# Patient Record
Sex: Male | Born: 1960 | Race: Black or African American | Hispanic: No | Marital: Single | Smoking: Current every day smoker
Health system: Southern US, Community
[De-identification: ages and names within clinical notes are randomized; demographics above are authoritative.]

## PROBLEM LIST (undated history)

## (undated) DIAGNOSIS — D539 Nutritional anemia, unspecified: Secondary | ICD-10-CM

## (undated) DIAGNOSIS — C801 Malignant (primary) neoplasm, unspecified: Secondary | ICD-10-CM

## (undated) DIAGNOSIS — N209 Urinary calculus, unspecified: Secondary | ICD-10-CM

## (undated) DIAGNOSIS — Z5189 Encounter for other specified aftercare: Secondary | ICD-10-CM

## (undated) DIAGNOSIS — J431 Panlobular emphysema: Secondary | ICD-10-CM

## (undated) DIAGNOSIS — M1990 Unspecified osteoarthritis, unspecified site: Secondary | ICD-10-CM

## (undated) DIAGNOSIS — M5416 Radiculopathy, lumbar region: Secondary | ICD-10-CM

## (undated) DIAGNOSIS — N189 Chronic kidney disease, unspecified: Secondary | ICD-10-CM

## (undated) DIAGNOSIS — N400 Enlarged prostate without lower urinary tract symptoms: Secondary | ICD-10-CM

## (undated) DIAGNOSIS — T7840XA Allergy, unspecified, initial encounter: Secondary | ICD-10-CM

## (undated) DIAGNOSIS — F1910 Other psychoactive substance abuse, uncomplicated: Secondary | ICD-10-CM

## (undated) DIAGNOSIS — Z789 Other specified health status: Secondary | ICD-10-CM

## (undated) HISTORY — DX: Chronic kidney disease, unspecified: N18.9

## (undated) HISTORY — DX: Other psychoactive substance abuse, uncomplicated: F19.10

## (undated) HISTORY — DX: Radiculopathy, lumbar region: M54.16

## (undated) HISTORY — DX: Allergy, unspecified, initial encounter: T78.40XA

## (undated) HISTORY — DX: Benign prostatic hyperplasia without lower urinary tract symptoms: N40.0

## (undated) HISTORY — DX: Panlobular emphysema: J43.1

## (undated) HISTORY — DX: Encounter for other specified aftercare: Z51.89

## (undated) HISTORY — PX: TONSILLECTOMY: SUR1361

## (undated) HISTORY — PX: CERVICAL DISC SURGERY: SHX588

## (undated) HISTORY — PX: BACK SURGERY: SHX140

## (undated) NOTE — *Deleted (*Deleted)
Transition of Care [facility]) - Progression Note    Patient Details  Name: [name] MRN: [id number] Date of Birth: [birth year]  Transition of Care [facility]) CM/SW Contact  Natasha Bence, LCSW Phone Number: 02/05/2020, 11:52 AM  Clinical Narrative:    CSW contacted [place] to confirm their ability to take patient. Robin with [place] reported that they are unable to take patient due to patient's pain med needs and level of care required. [facility] [place] reported that they did not have bed availability for his level of care at the moment and that patient would need to be accessed on Monday for GIP before receiving patient. Patient is already GIP per MD. CSW notified Shirlean Mylar of the need form hospice to discuss delay in discharge to [place] to patient's daughter for         Expected Discharge Plan and Services           Expected Discharge Date: 02/04/20                                     Social Determinants of Health (SDOH) Interventions    Readmission Risk Interventions Readmission Risk Prevention Plan 07/16/2018 07/16/2018 06/14/2018  Transportation Screening Complete Complete Complete  Social Work Consult for [place] Planning/Counseling - - Complete  PCP or Specialist appointment within 3-5 days of discharge Complete - -  [place] or Home Care Consult Complete - -  Palliative Care Screening Not Applicable - -  [place] Not Applicable - -  Some recent data might be hidden

## (undated) NOTE — *Deleted (*Deleted)
Physician Discharge Summary  Artemas Lettman [id number] DOB: March 01, 1961 DOA: 02/04/2020  PCP: Rosita Fire, MD  Admit date: 02/16/2020 Discharge date: 02-12-2020  Admitted From: Home Disposition:  Residential Hospice  Recommendations for Outpatient Follow-up:  1. Follow up with PCP in 1-2 weeks 2. Please obtain BMP/CBC in one week 3. Please follow up on the following pending results:  Home Health:*** (YES/NO) Equipment/Devices:*** (oxygen ?L, foley, PICC line--date placed, wound care, Pleurx)  Discharge Condition: Stable CODE STATUS:*** (FULL, DNR, Comfort Care) Diet recommendation: Heart Healthy / Carb Modified / Dysphagia / Regular   Brief/Interim Summary: 35 y.o.malewith medical history significant ofend-stage myeloma, associated pancytopenia, generalized failure to thrive, chronic pain from underlying malignancy, admitted to the hospital with severe cytopenias including thrombocytopenia and anemia requiring transfusion of PRBC and platelets. He was continued on pain management. He was recently seen at [facility] by oncology and was not felt to be a candidate for any further treatments. After seeing palliative care, family elected to pursue comfort measures and has been transition to comfort care. He is currently on fentanyl infusion for pain management and respiratory distress. He is waiting a bed at residential hospice. With the assistance of hospice of [place], they requested that the patient be transitioned to TD and/or po comfort meds in preparation to transition to residential hospice.  TD fentanyl was started and  fentanyl infusion was subsequently wanted off.  The patient continued to receive po and IV opioids for breakthrough pain and dyspnea.  His family was educated on anticipatory care going forward with focus soley on the patient's comfort.  They agree with the overall plan, but did make multiple requests for  adjustments of the patient's medications to optimize the patient's comfort.  The patient remained hemodynamically stable for transfer to residential hospice.  Discharge Diagnoses:   Severe Pancytopenia/Myeloma -presented with WBC 1.9, Hgb 6.1 and platelets 12K -01/10/20 bone marrow--97% plasma cellswith unfavorable cytogenetics -Evaluated on 01/26/20 bymyeloma specialist Dr.-Jonathan Percell Miller Lambird--recommended transition to palliative care and hospice patient does not have any good therapeutic options -has been transfused with PRBC and platelets -no further blood draws as focus of care is comfort directed -pt has had some bleeding gums due to pancytopenia  CKD 5/hyperkalemia -baseline creatinine 5.6-5.8 -essentially anuric at this point -no further blood draws as focus of care is comfort directed  LumbarRadiculopathy---- -Spinal Stenosis/lumbar radiculopathy status post fusion(initially in 2012 and then again in4/25/2017)---- MRI L-spine--diffusely abnormal bone marrow signal concerning for myeloproliferative disorder versus infiltrative metastatic disease. Abnormal prevertebraltissue as discussed above at L2-3 -3/16 MR T-spine--no acute findings,neurosurgery, Dr. Melynda Keller nonoperative managementfor radiculopathy -MRI of the thoracic and lumbar spine on 10/06/2019 shows T7, T10, T11 and T12 lesions are slightly better seen on the study as the prior study was degraded by motion artifact. No new findings. Overall grossly stable appearing myelomatous changes at T12, L2 and L3 with no progressive findings. Right seventh and ninth posterior rib lesions are probably stable. Right seventh posterior rib lesion may be larger or just better seen. There does appear to be a new adjacent soft tissue component.  COPD -stable on RA -BDs as needed  Chronic pain/neoplastic related pain -transition from IV fentanyl to TD with prn breakthrough meds -increase fentanyl TD to 150  mcg -added robaxin  Goals of Care/Social -see by palliative medicine -after goals of care discussion, patient has been transitioned to full comfort care -now GIP whil waiting for residential hospice bed -add ativan prn agitation although  family preferred haldol -continue fentanyl TD and IV for breakthrough pain -dilaudid po prn breakthrough pain -robinul for secretions   Discharge Instructions   Allergies as of 02/05/2020      Reactions   Oxycodone-acetaminophen    Acetaminophen Nausea Only, Other (See Comments)   Elevated liver enzymes   Cymbalta [duloxetine Hcl] Swelling   Facial swelling   Diclofenac Sodium Swelling      Diclofenac Sodium Swelling   Imodium [loperamide] Swelling   facial   Lyrica [pregabalin] Swelling   Morphine Other (See Comments)   Bradycardia   Vioxx [rofecoxib] Swelling   Aleve [naproxen] Swelling   eye      Medication List    TAKE these medications   fentaNYL 75 MCG/HR Commonly known as: DURAGESIC Place 2 patches onto the skin every 3 (three) days. Start taking on: 05-Mar-2020            Durable Medical Equipment  (From admission, onward)         Start     Ordered   01/31/20 1054  For home use only DME Air overlay mattress  Once        01/31/20 1053          Allergies  Allergen Reactions  . Oxycodone-Acetaminophen   . Acetaminophen Nausea Only and Other (See Comments)    Elevated liver enzymes  . Cymbalta [Duloxetine Hcl] Swelling    Facial swelling  . Diclofenac Sodium Swelling       . Diclofenac Sodium Swelling  . Imodium [Loperamide] Swelling    facial  . Lyrica [Pregabalin] Swelling  . Morphine Other (See Comments)    Bradycardia   . Vioxx [Rofecoxib] Swelling  . Aleve [Naproxen] Swelling    eye    Consultations:  Palliative medicine   Procedures/Studies:  No results found.      Discharge Exam: Vitals:   02/02/20 0828 02/04/20 2122  BP: 106/62 139/63  Pulse: 96 (!) 115  Resp: 19  20  Temp: 98.4 F (36.9 C) 98.4 F (36.9 C)  SpO2:  100%   Vitals:   02/01/20 1507 02/01/20 1742 02/02/20 0828 02/04/20 2122  BP: (!) 113/59  106/62 139/63  Pulse: (!) 105  96 (!) 115  Resp: 15  19 20   Temp: 99.3 F (37.4 C)  98.4 F (36.9 C) 98.4 F (36.9 C)  TempSrc: Oral  Oral   SpO2: 99% 99% 100% 100%    General: Pt is alert, awake, not in acute distress Cardiovascular: RRR, S1/S2 +, no rubs, no gallops Respiratory: bibasilar rales. No wheeze Abdominal: Soft, NT, ND, bowel sounds + Extremities: no edema, no cyanosis   The results of significant diagnostics from this hospitalization (including imaging, microbiology, ancillary and laboratory) are listed below for reference.    Significant Diagnostic Studies: No results found.   Microbiology: Recent Results (from the past 240 hour(s))  Respiratory Panel by RT PCR (Flu A&B, Covid) - Nasopharyngeal Swab     Status: None   Collection Time: 01/27/20  1:41 PM   Specimen: Nasopharyngeal Swab  Result Value Ref Range Status   SARS Coronavirus 2 by RT PCR NEGATIVE NEGATIVE Final    Comment: (NOTE) SARS-CoV-2 target nucleic acids are NOT DETECTED.  The SARS-CoV-2 RNA is generally detectable in upper respiratoy specimens during the acute phase of infection. The lowest concentration of SARS-CoV-2 viral copies this assay can detect is 131 copies/mL. A negative result does not preclude SARS-Cov-2 infection and should not be used as  the sole basis for treatment or other patient management decisions. A negative result may occur with  improper specimen collection/handling, submission of specimen other than nasopharyngeal swab, presence of viral mutation(s) within the areas targeted by this assay, and inadequate number of viral copies (<131 copies/mL). A negative result must be combined with clinical observations, patient history, and epidemiological information. The expected result is Negative.  Fact Sheet for Patients:   PinkCheek.be  Fact Sheet for Healthcare Providers:  GravelBags.it  This test is no t yet approved or cleared by the [place] FDA and  has been authorized for detection and/or diagnosis of SARS-CoV-2 by FDA under an Emergency Use Authorization (EUA). This EUA will remain  in effect (meaning this test can be used) for the duration of the COVID-19 declaration under Section 564(b)(1) of the Act, 21 U.S.C. section 360bbb-3(b)(1), unless the authorization is terminated or revoked sooner.     Influenza A by PCR NEGATIVE NEGATIVE Final   Influenza B by PCR NEGATIVE NEGATIVE Final    Comment: (NOTE) The Xpert Xpress SARS-CoV-2/FLU/RSV assay is intended as an aid in  the diagnosis of influenza from Nasopharyngeal swab specimens and  should not be used as a sole basis for treatment. Nasal washings and  aspirates are unacceptable for Xpert Xpress SARS-CoV-2/FLU/RSV  testing.  Fact Sheet for Patients: PinkCheek.be  Fact Sheet for Healthcare Providers: GravelBags.it  This test is not yet approved or cleared by the [place] FDA and  has been authorized for detection and/or diagnosis of SARS-CoV-2 by  FDA under an Emergency Use Authorization (EUA). This EUA will remain  in effect (meaning this test can be used) for the duration of the  Covid-19 declaration under Section 564(b)(1) of the Act, 21  U.S.C. section 360bbb-3(b)(1), unless the authorization is  terminated or revoked. Performed at [facility], [address] [place], [place] [address]      Labs: Basic Metabolic Panel: No results for input(s): NA, K, CL, CO2, GLUCOSE, BUN, CREATININE, CALCIUM, MG, PHOS in the last 168 hours. Liver Function Tests: No results for input(s): AST, ALT, ALKPHOS, BILITOT, PROT, ALBUMIN in the last 168 hours. No results for input(s): LIPASE, AMYLASE in the last 168 hours. No  results for input(s): AMMONIA in the last 168 hours. CBC: No results for input(s): WBC, NEUTROABS, HGB, HCT, MCV, PLT in the last 168 hours. Cardiac Enzymes: No results for input(s): CKTOTAL, CKMB, CKMBINDEX, TROPONINI in the last 168 hours. BNP: Invalid input(s): POCBNP CBG: No results for input(s): GLUCAP in the last 168 hours.  Time coordinating discharge:  36 minutes  Signed:  Orson Eva, DO [facility] Pager: [phone] 02/05/2020, 5:38 PM

---

## 2000-03-26 ENCOUNTER — Ambulatory Visit (HOSPITAL_COMMUNITY): Admission: RE | Admit: 2000-03-26 | Discharge: 2000-03-26 | Payer: Self-pay | Admitting: Family Medicine

## 2002-03-31 DIAGNOSIS — C801 Malignant (primary) neoplasm, unspecified: Secondary | ICD-10-CM

## 2002-03-31 HISTORY — DX: Malignant (primary) neoplasm, unspecified: C80.1

## 2002-03-31 HISTORY — PX: OTHER SURGICAL HISTORY: SHX169

## 2004-02-17 ENCOUNTER — Emergency Department (HOSPITAL_COMMUNITY): Admission: EM | Admit: 2004-02-17 | Discharge: 2004-02-17 | Payer: Self-pay | Admitting: Emergency Medicine

## 2007-03-15 ENCOUNTER — Emergency Department (HOSPITAL_COMMUNITY): Admission: EM | Admit: 2007-03-15 | Discharge: 2007-03-15 | Payer: Self-pay | Admitting: Emergency Medicine

## 2007-07-02 ENCOUNTER — Ambulatory Visit (HOSPITAL_COMMUNITY): Admission: RE | Admit: 2007-07-02 | Discharge: 2007-07-02 | Payer: Self-pay | Admitting: Family Medicine

## 2007-07-09 ENCOUNTER — Ambulatory Visit (HOSPITAL_COMMUNITY): Admission: RE | Admit: 2007-07-09 | Discharge: 2007-07-09 | Payer: Self-pay | Admitting: Family Medicine

## 2007-10-26 ENCOUNTER — Emergency Department (HOSPITAL_COMMUNITY): Admission: EM | Admit: 2007-10-26 | Discharge: 2007-10-26 | Payer: Self-pay | Admitting: Emergency Medicine

## 2007-11-03 ENCOUNTER — Inpatient Hospital Stay (HOSPITAL_COMMUNITY): Admission: RE | Admit: 2007-11-03 | Discharge: 2007-11-05 | Payer: Self-pay | Admitting: Neurosurgery

## 2007-11-17 ENCOUNTER — Emergency Department (HOSPITAL_COMMUNITY): Admission: EM | Admit: 2007-11-17 | Discharge: 2007-11-17 | Payer: Self-pay | Admitting: Emergency Medicine

## 2008-02-01 ENCOUNTER — Encounter (HOSPITAL_COMMUNITY): Admission: RE | Admit: 2008-02-01 | Discharge: 2008-03-02 | Payer: Self-pay | Admitting: Neurosurgery

## 2008-02-09 ENCOUNTER — Emergency Department (HOSPITAL_COMMUNITY): Admission: EM | Admit: 2008-02-09 | Discharge: 2008-02-09 | Payer: Self-pay | Admitting: Emergency Medicine

## 2008-03-16 ENCOUNTER — Ambulatory Visit: Admission: RE | Admit: 2008-03-16 | Discharge: 2008-03-16 | Payer: Self-pay | Admitting: Neurosurgery

## 2008-03-23 ENCOUNTER — Emergency Department (HOSPITAL_COMMUNITY): Admission: EM | Admit: 2008-03-23 | Discharge: 2008-03-23 | Payer: Self-pay | Admitting: Emergency Medicine

## 2008-03-29 ENCOUNTER — Ambulatory Visit (HOSPITAL_COMMUNITY): Admission: RE | Admit: 2008-03-29 | Discharge: 2008-03-29 | Payer: Self-pay | Admitting: Family Medicine

## 2008-04-11 ENCOUNTER — Ambulatory Visit (HOSPITAL_COMMUNITY): Admission: RE | Admit: 2008-04-11 | Discharge: 2008-04-11 | Payer: Self-pay | Admitting: Urology

## 2008-04-25 ENCOUNTER — Encounter: Admission: RE | Admit: 2008-04-25 | Discharge: 2008-04-25 | Payer: Self-pay | Admitting: Neurosurgery

## 2008-06-02 ENCOUNTER — Inpatient Hospital Stay (HOSPITAL_COMMUNITY): Admission: RE | Admit: 2008-06-02 | Discharge: 2008-06-04 | Payer: Self-pay | Admitting: Neurosurgery

## 2008-07-05 ENCOUNTER — Ambulatory Visit (HOSPITAL_COMMUNITY): Admission: RE | Admit: 2008-07-05 | Discharge: 2008-07-05 | Payer: Self-pay | Admitting: Family Medicine

## 2008-07-28 ENCOUNTER — Inpatient Hospital Stay (HOSPITAL_COMMUNITY): Admission: RE | Admit: 2008-07-28 | Discharge: 2008-07-30 | Payer: Self-pay | Admitting: Neurosurgery

## 2008-11-22 ENCOUNTER — Encounter: Admission: RE | Admit: 2008-11-22 | Discharge: 2008-11-22 | Payer: Self-pay | Admitting: Neurosurgery

## 2008-12-27 ENCOUNTER — Ambulatory Visit (HOSPITAL_COMMUNITY): Admission: RE | Admit: 2008-12-27 | Discharge: 2008-12-28 | Payer: Self-pay | Admitting: Neurosurgery

## 2009-02-13 ENCOUNTER — Encounter (HOSPITAL_COMMUNITY): Admission: RE | Admit: 2009-02-13 | Discharge: 2009-03-15 | Payer: Self-pay | Admitting: Neurosurgery

## 2009-07-27 ENCOUNTER — Emergency Department (HOSPITAL_COMMUNITY): Admission: EM | Admit: 2009-07-27 | Discharge: 2009-07-27 | Payer: Self-pay | Admitting: Emergency Medicine

## 2009-08-21 ENCOUNTER — Encounter: Admission: RE | Admit: 2009-08-21 | Discharge: 2009-08-21 | Payer: Self-pay | Admitting: Neurosurgery

## 2010-01-16 ENCOUNTER — Inpatient Hospital Stay (HOSPITAL_COMMUNITY): Admission: RE | Admit: 2010-01-16 | Discharge: 2010-01-23 | Payer: Self-pay | Admitting: Neurosurgery

## 2010-01-16 ENCOUNTER — Ambulatory Visit: Payer: Self-pay | Admitting: Cardiovascular Disease

## 2010-01-18 ENCOUNTER — Encounter (INDEPENDENT_AMBULATORY_CARE_PROVIDER_SITE_OTHER): Payer: Self-pay | Admitting: Neurosurgery

## 2010-02-25 ENCOUNTER — Encounter: Payer: Self-pay | Admitting: Cardiovascular Disease

## 2010-02-25 ENCOUNTER — Ambulatory Visit: Payer: Self-pay | Admitting: Cardiovascular Disease

## 2010-02-25 DIAGNOSIS — I493 Ventricular premature depolarization: Secondary | ICD-10-CM | POA: Insufficient documentation

## 2010-03-07 ENCOUNTER — Telehealth (INDEPENDENT_AMBULATORY_CARE_PROVIDER_SITE_OTHER): Payer: Self-pay | Admitting: *Deleted

## 2010-04-08 ENCOUNTER — Telehealth (INDEPENDENT_AMBULATORY_CARE_PROVIDER_SITE_OTHER): Payer: Self-pay | Admitting: *Deleted

## 2010-04-24 ENCOUNTER — Encounter: Payer: Self-pay | Admitting: Cardiovascular Disease

## 2010-04-24 ENCOUNTER — Ambulatory Visit
Admission: RE | Admit: 2010-04-24 | Discharge: 2010-04-24 | Payer: Self-pay | Source: Home / Self Care | Attending: Cardiovascular Disease | Admitting: Cardiovascular Disease

## 2010-04-24 DIAGNOSIS — M79609 Pain in unspecified limb: Secondary | ICD-10-CM | POA: Insufficient documentation

## 2010-04-24 DIAGNOSIS — I6529 Occlusion and stenosis of unspecified carotid artery: Secondary | ICD-10-CM | POA: Insufficient documentation

## 2010-04-30 NOTE — Progress Notes (Signed)
  Phone Note Other Incoming   Request: Send information Summary of Call: Request for records received from [facility]. Request forwarded to [facility].     

## 2010-04-30 NOTE — Assessment & Plan Note (Signed)
Summary: eph   Visit Type:  Post-hospital   History of Present Illness: 50 year-old male presenting for hospital followup evaluation. He was evaluated after recent back surgery for PVC's and ventricular bigeminy. The patient was asymptomatic at the time and an echo showed normal LV function with an LVEF of 65% and no significant valvular disease. He has no chest pain or dyspnea. No edema, orthopnea, PND, or palpitations. No past history of cardiac problems.  Current Medications (verified): 1)  Oxycodone Hcl 15 Mg Tabs (Oxycodone Hcl) .... As Needed 2)  Diazepam 5 Mg Tabs (Diazepam) .... As Needed  Allergies: 1)  ! Morphine 2)  ! Voltaren  Past History:  Past medical history reviewed for relevance to current acute and chronic problems.  Past Medical History: Reviewed history from 02/20/2010 and no changes required. Tumor of the left testicle  lumbar laminectomy  cervical fusion  PVCs History of nephrolithiasis History of colon polyps  Review of Systems       Positive for chronic back pain, otherwise negative except as per HPI  Vital Signs:  Patient profile:   50 year old male Height:      72 inches Weight:      181.25 pounds BMI:     24.67 Pulse rate:   68 / minute Pulse rhythm:   regular Resp:     18 per minute BP sitting:   138 / 90  (left arm) Cuff size:   large  Vitals Entered By: Sidney Ace (February 25, 2010 3:30 PM)  Physical Exam  General:  Pt is alert and oriented, in no acute distress. HEENT: normal Neck: normal carotid upstrokes without bruits, JVP normal Lungs: CTA CV: RRR without murmur or gallop Abd: soft, NT, positive BS, no bruit, no organomegaly Ext: no clubbing, cyanosis, or edema. peripheral pulses 2+ and equal Skin: warm and dry without rash    EKG  Procedure date:  02/25/2010  Findings:      NSR 67 bpm, within normal limits.  Impression & Recommendations:  Problem # 1:  PREMATURE VENTRICULAR CONTRACTIONS (ICD-427.69) Pt's  rhythm is stable. Echo reviewed and showed normal findings. He has no cardiac symptoms. Recommend followup as needed. We discussed the importance of tobacco cessation and he understands.  Orders: EKG w/ Interpretation ([address])  Patient Instructions: 1)  Your physician recommends that you schedule a follow-up appointment in:  as needed with DR. Azlyn Wingler 2)  Your physician recommends that you continue on your current medications as directed. Please refer to the Current Medication list given to you today.

## 2010-05-02 NOTE — Progress Notes (Signed)
  Phone Note Other Incoming   Request: Send information Summary of Call: Request for records received from [facility]. Request forwarded to [facility].  [name]

## 2010-05-02 NOTE — Assessment & Plan Note (Signed)
Summary: ROV   Visit Type:  Follow-up  CC:  Legs and hands  numbness.  History of Present Illness: 50 year-old male presenting for followup evaluation of PVC's and chest pain. He has normal LV function by echo. He was seen by Dr Joya Salm yesterday and had some plain XRays of the cervical and lumbar spine performed...these showed arterial calcification of the carotid arteries. He was referred back for evaluation here.  The patient denies storke or TIA symptoms. He does complain of bilateral leg pain and numbness worse with ambulation. No chest pain, dyspnea, or palpitations. No other complaints today.  Current Medications (verified): 1)  Oxycodone Hcl 15 Mg Tabs (Oxycodone Hcl) .... As Needed 2)  Diazepam 5 Mg Tabs (Diazepam) .... As Needed 3)  Vesicare 5 Mg Tabs (Solifenacin Succinate) .... Take 1 Tablet By Mouth Once A Day  Allergies: 1)  ! Morphine 2)  ! Voltaren  Past History:  Past medical history reviewed for relevance to current acute and chronic problems.  Past Medical History: Reviewed history from 02/20/2010 and no changes required. Tumor of the left testicle  lumbar laminectomy  cervical fusion  PVCs History of nephrolithiasis History of colon polyps  Review of Systems       Negative except as per HPI   Vital Signs:  Patient profile:   50 year old male Height:      72 inches Weight:      179.50 pounds BMI:     24.43 Pulse rate:   65 / minute Pulse rhythm:   regular Resp:     18 per minute BP sitting:   136 / 80  (left arm) Cuff size:   regular  Vitals Entered By: Sidney Ace (April 24, 2010 10:01 AM)  Serial Vital Signs/Assessments:  Time      Position  BP       Pulse  Resp  Temp     By           R Arm     134/84                         Sidney Ace   Physical Exam  General:  Pt is alert and oriented, in no acute distress. HEENT: normal Neck: normal carotid upstrokes without bruits, JVP normal Lungs: CTA CV: RRR without murmur or  gallop Abd: soft, NT, positive BS, no bruit, no organomegaly Ext: no clubbing, cyanosis, or edema. peripheral pulses 2+ and equal Skin: warm and dry without rash    EKG  Procedure date:  04/24/2010  Findings:      NSR, within normal limits. HR 65 bpm.  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 I cannot find the official radiology interpretation, but there does appear to be carotid calcification. In this patient with longstanding smoking, will check a carotid duplex to rule out significant stenosis. Recommended starting a daily ASA 81 mg. Will check lipids with a low threshold to start a statin (ldl goal less than 100 mg/dL).  His updated medication list for this problem includes:    Aspirin 81 Mg Tbec (Aspirin) .Marland Kitchen... Take one tablet by mouth daily  Orders: EKG w/ Interpretation (93000) Carotid Duplex (Carotid Duplex)  Problem # 2:  PAIN IN LIMB (ICD-729.5) Suspect related to back problems, but will check ABI's as he is at risk for vascular disease.  Orders: EKG w/ Interpretation (93000) Arterial Duplex Lower Extremity (Arterial Duplex Low)  Patient Instructions: 1)  Your physician recommends that you return for a FASTING LIPID Panel on the day of your vascular studies (433.10)--Nothing to eat or drink after midnight  2)  Your physician has recommended you make the following change in your medication: START Aspirin 81mg  once a day 3)  Your physician wants you to follow-up in: 1 YEAR.   You will receive a reminder letter in the mail two months in advance. If you don't receive a letter, please call our office to schedule the follow-up appointment. 4)  Your physician has requested that you have an ankle brachial index (ABI). During this test an ultrasound and blood pressure cuff are used to evaluate the arteries that supply the arms and legs with blood. Allow thirty minutes for this exam. There are no restrictions or special  instructions. 5)  Your physician has requested that you have a carotid duplex. This test is an ultrasound of the carotid arteries in your neck. It looks at blood flow through these arteries that supply the brain with blood. Allow one hour for this exam. There are no restrictions or special instructions.

## 2010-05-03 ENCOUNTER — Encounter (INDEPENDENT_AMBULATORY_CARE_PROVIDER_SITE_OTHER): Payer: Self-pay | Admitting: *Deleted

## 2010-05-03 ENCOUNTER — Encounter: Payer: Self-pay | Admitting: Cardiovascular Disease

## 2010-05-03 ENCOUNTER — Other Ambulatory Visit: Payer: Self-pay | Admitting: Cardiovascular Disease

## 2010-05-03 ENCOUNTER — Encounter (INDEPENDENT_AMBULATORY_CARE_PROVIDER_SITE_OTHER): Payer: BC Managed Care – PPO

## 2010-05-03 ENCOUNTER — Other Ambulatory Visit (INDEPENDENT_AMBULATORY_CARE_PROVIDER_SITE_OTHER): Payer: BC Managed Care – PPO

## 2010-05-03 DIAGNOSIS — I739 Peripheral vascular disease, unspecified: Secondary | ICD-10-CM

## 2010-05-03 DIAGNOSIS — I6529 Occlusion and stenosis of unspecified carotid artery: Secondary | ICD-10-CM

## 2010-05-03 LAB — LIPID PANEL
Cholesterol: 162 mg/dL (ref 0–200)
HDL: 50.9 mg/dL (ref >39.00)
LDL Cholesterol: 89 mg/dL (ref 0–99)
Total CHOL/HDL Ratio: 3
Triglycerides: 112 mg/dL (ref 0.0–149.0)
VLDL: 22.4 mg/dL (ref 0.0–40.0)

## 2010-05-06 ENCOUNTER — Encounter: Payer: Self-pay | Admitting: Cardiovascular Disease

## 2010-05-16 NOTE — Letter (Signed)
Summary: [facility], [place] [address] [place]   [place], [place] [address]   Phone: [phone]  Fax: [phone]     May 06, 2010 MRN: [id number]   [name] [place], [place]  [address]   Dear [name],  We have reviewed your cholesterol results.  They are as follows:     Total Cholesterol:    162 (Desirable: less than 200)       HDL  Cholesterol:     50.90  (Desirable: greater than 40 for men and 50 for women)       LDL Cholesterol:       89  (Desirable: less than 100 for low risk and less than 70 for moderate to high risk)       Triglycerides:       112.0  (Desirable: less than 150)  Our recommendations include: Your cholesterol numbers are at goal.   Call our office at the number listed above if you have any questions.   *These are risk factors YOU HAVE CONTROL OVER.  For more information, visit .  There is now evidence that lowering the TOTAL CHOLESTEROL AND LDL CHOLESTEROL can reduce the risk of heart disease.  The American Heart Association recommends the following guidelines for the treatment of elevated cholesterol:  1.  If there is now current heart disease and less than two risk factors, TOTAL CHOLESTEROL should be less than 200 and LDL CHOLESTEROL should be less than 100. 2.  If there is current heart disease or two or more risk factors, TOTAL CHOLESTEROL should be less than 200 and LDL CHOLESTEROL should be less than 70.  A diet low in cholesterol, saturated fat, and calories is the cornerstone of treatment for elevated cholesterol.  Cessation of smoking and exercise are also important in the management of elevated cholesterol and preventing vascular disease.  Studies have shown that 30 to 60 minutes of physical activity most days can help lower blood pressure, lower cholesterol, and keep your weight at a healthy level.  Drug therapy is used when cholesterol levels do not respond to therapeutic  lifestyle changes (smoking cessation, diet, and exercise) and remains unacceptably high.  If medication is started, it is important to have you levels checked periodically to evaluate the need for further treatment options.  Thank you,    [facility]

## 2010-05-23 ENCOUNTER — Telehealth (INDEPENDENT_AMBULATORY_CARE_PROVIDER_SITE_OTHER): Payer: Self-pay | Admitting: *Deleted

## 2010-05-28 NOTE — Progress Notes (Signed)
  Request received form [facility] sent to [place]  May 23, 2010 9:00 AM

## 2010-06-12 LAB — COMPREHENSIVE METABOLIC PANEL
ALT: 19 U/L (ref 0–53)
ALT: 21 U/L (ref 0–53)
AST: 22 U/L (ref 0–37)
AST: 35 U/L (ref 0–37)
Albumin: 2.7 g/dL — ABNORMAL LOW (ref 3.5–5.2)
Albumin: 3.8 g/dL (ref 3.5–5.2)
Alkaline Phosphatase: 52 U/L (ref 39–117)
Alkaline Phosphatase: 56 U/L (ref 39–117)
BUN: 5 mg/dL — ABNORMAL LOW (ref 6–23)
BUN: 9 mg/dL (ref 6–23)
CO2: 28 mEq/L (ref 19–32)
CO2: 32 mEq/L (ref 19–32)
Calcium: 7.9 mg/dL — ABNORMAL LOW (ref 8.4–10.5)
Calcium: 9 mg/dL (ref 8.4–10.5)
Chloride: 106 mEq/L (ref 96–112)
Chloride: 95 mEq/L — ABNORMAL LOW (ref 96–112)
Creatinine, Ser: 0.82 mg/dL (ref 0.4–1.5)
Creatinine, Ser: 1.05 mg/dL (ref 0.4–1.5)
GFR calc Af Amer: >60 mL/min (ref >60)
GFR calc Af Amer: >60 mL/min (ref >60)
GFR calc non Af Amer: >60 mL/min (ref >60)
GFR calc non Af Amer: >60 mL/min (ref >60)
Glucose, Bld: 105 mg/dL — ABNORMAL HIGH (ref 70–99)
Glucose, Bld: 149 mg/dL — ABNORMAL HIGH (ref 70–99)
Potassium: 3.8 mEq/L (ref 3.5–5.1)
Potassium: 3.9 mEq/L (ref 3.5–5.1)
Sodium: 133 mEq/L — ABNORMAL LOW (ref 135–145)
Sodium: 137 mEq/L (ref 135–145)
Total Bilirubin: 0.8 mg/dL (ref 0.3–1.2)
Total Bilirubin: 2.1 mg/dL — ABNORMAL HIGH (ref 0.3–1.2)
Total Protein: 5.1 g/dL — ABNORMAL LOW (ref 6.0–8.3)
Total Protein: 6.5 g/dL (ref 6.0–8.3)

## 2010-06-12 LAB — CBC
HCT: 33.9 % — ABNORMAL LOW (ref 39.0–52.0)
HCT: 41 % (ref 39.0–52.0)
Hemoglobin: 11.2 g/dL — ABNORMAL LOW (ref 13.0–17.0)
Hemoglobin: 14.2 g/dL (ref 13.0–17.0)
MCH: 31.1 pg (ref 26.0–34.0)
MCH: 31.2 pg (ref 26.0–34.0)
MCHC: 33 g/dL (ref 30.0–36.0)
MCHC: 34.6 g/dL (ref 30.0–36.0)
MCV: 90.1 fL (ref 78.0–100.0)
MCV: 94.2 fL (ref 78.0–100.0)
Platelets: 110 10*3/uL — ABNORMAL LOW (ref 150–400)
Platelets: 187 10*3/uL (ref 150–400)
RBC: 3.6 MIL/uL — ABNORMAL LOW (ref 4.22–5.81)
RBC: 4.55 MIL/uL (ref 4.22–5.81)
RDW: 12.4 % (ref 11.5–15.5)
RDW: 13.9 % (ref 11.5–15.5)
WBC: 12.5 10*3/uL — ABNORMAL HIGH (ref 4.0–10.5)
WBC: 7.5 10*3/uL (ref 4.0–10.5)

## 2010-06-12 LAB — CARDIAC PANEL(CRET KIN+CKTOT+MB+TROPI)
CK, MB: 14 ng/mL (ref 0.3–4.0)
CK, MB: 18.9 ng/mL (ref 0.3–4.0)
CK, MB: 19.8 ng/mL (ref 0.3–4.0)
Relative Index: 0.9 (ref 0.0–2.5)
Relative Index: 1.2 (ref 0.0–2.5)
Relative Index: 1.7 (ref 0.0–2.5)
Total CK: 1115 U/L — ABNORMAL HIGH (ref 7–232)
Total CK: 1625 U/L — ABNORMAL HIGH (ref 7–232)
Total CK: 1678 U/L — ABNORMAL HIGH (ref 7–232)
Troponin I: 0.01 ng/mL (ref 0.00–0.06)
Troponin I: <0.01 ng/mL (ref 0.00–0.06)
Troponin I: <0.01 ng/mL (ref 0.00–0.06)

## 2010-06-12 LAB — BASIC METABOLIC PANEL
BUN: 6 mg/dL (ref 6–23)
CO2: 30 mEq/L (ref 19–32)
Calcium: 8.1 mg/dL — ABNORMAL LOW (ref 8.4–10.5)
Chloride: 102 mEq/L (ref 96–112)
Creatinine, Ser: 0.87 mg/dL (ref 0.4–1.5)
GFR calc Af Amer: >60 mL/min (ref >60)
GFR calc non Af Amer: >60 mL/min (ref >60)
Glucose, Bld: 125 mg/dL — ABNORMAL HIGH (ref 70–99)
Potassium: 3.9 mEq/L (ref 3.5–5.1)
Sodium: 135 mEq/L (ref 135–145)

## 2010-06-12 LAB — MAGNESIUM
Magnesium: 1.4 mg/dL — ABNORMAL LOW (ref 1.5–2.5)
Magnesium: 2 mg/dL (ref 1.5–2.5)

## 2010-06-12 LAB — TSH: TSH: 0.343 u[IU]/mL — ABNORMAL LOW (ref 0.350–4.500)

## 2010-06-13 LAB — TYPE AND SCREEN
ABO/RH(D): A POS
Antibody Screen: NEGATIVE

## 2010-06-13 LAB — CBC
HCT: 42 % (ref 39.0–52.0)
Hemoglobin: 14.4 g/dL (ref 13.0–17.0)
MCH: 32.3 pg (ref 26.0–34.0)
MCHC: 34.3 g/dL (ref 30.0–36.0)
MCV: 94.2 fL (ref 78.0–100.0)
Platelets: 239 10*3/uL (ref 150–400)
RBC: 4.46 MIL/uL (ref 4.22–5.81)
RDW: 13.6 % (ref 11.5–15.5)
WBC: 8.5 10*3/uL (ref 4.0–10.5)

## 2010-06-13 LAB — SURGICAL PCR SCREEN
MRSA, PCR: POSITIVE — AB
Staphylococcus aureus: POSITIVE — AB

## 2010-07-05 LAB — GLUCOSE, CAPILLARY: Glucose-Capillary: 97 mg/dL (ref 70–99)

## 2010-07-05 LAB — CBC
HCT: 41.6 % (ref 39.0–52.0)
Hemoglobin: 14 g/dL (ref 13.0–17.0)
MCHC: 33.8 g/dL (ref 30.0–36.0)
MCV: 97.3 fL (ref 78.0–100.0)
Platelets: 282 10*3/uL (ref 150–400)
RBC: 4.27 MIL/uL (ref 4.22–5.81)
RDW: 15.1 % (ref 11.5–15.5)
WBC: 7.9 10*3/uL (ref 4.0–10.5)

## 2010-07-10 LAB — CBC
HCT: 39.2 % (ref 39.0–52.0)
Hemoglobin: 13.2 g/dL (ref 13.0–17.0)
MCHC: 33.7 g/dL (ref 30.0–36.0)
MCV: 97.6 fL (ref 78.0–100.0)
Platelets: 327 10*3/uL (ref 150–400)
RBC: 4.02 MIL/uL — ABNORMAL LOW (ref 4.22–5.81)
RDW: 15 % (ref 11.5–15.5)
WBC: 6.9 10*3/uL (ref 4.0–10.5)

## 2010-07-11 LAB — CBC
HCT: 39.9 % (ref 39.0–52.0)
Hemoglobin: 13.9 g/dL (ref 13.0–17.0)
MCHC: 34.8 g/dL (ref 30.0–36.0)
MCV: 96.2 fL (ref 78.0–100.0)
Platelets: 293 10*3/uL (ref 150–400)
RBC: 4.15 MIL/uL — ABNORMAL LOW (ref 4.22–5.81)
RDW: 15.4 % (ref 11.5–15.5)
WBC: 10.4 10*3/uL (ref 4.0–10.5)

## 2010-08-13 NOTE — Op Note (Signed)
NAMEJAIYDEN, [name]             ACCOUNT NO.:  [id number]   MEDICAL RECORD NO.:  [id number]          PATIENT TYPE:  OIB   LOCATION:  3007                         FACILITY:  [facility]   PHYSICIAN:  Leeroy Cha, M.D.   DATE OF BIRTH:  29-May-1960   DATE OF PROCEDURE:  07/28/2008  DATE OF DISCHARGE:                               OPERATIVE REPORT   PREOPERATIVE DIAGNOSES:  C5-C6 herniated disk spondylosis, C7 thoracic 1  spondylosis, herniated disk.   POSTOPERATIVE DIAGNOSES:  C5-C6 herniated disk spondylosis, C7 thoracic  1 spondylosis, herniated disk.   PROCEDURE:  C5-6 diskectomy and decompression of the spinal cord,  foraminotomy, interbody fusion with allograft, anterior C7 thoracic 1  diskectomy, decompression of spinal cord and both C8 nerve root,  allograft, and plate microscope.   SURGEON:  Leeroy Cha, MD   ASSISTANT:  -------   CLINICAL HISTORY:  [name] is a gentleman who complaining of neck  pain radiating to both upper extremities with weakness.  All of this  happened after accident.  X-rays show spondylosis and herniated disk at  the level of 5-6 and C7 thoracic 1 mostly on the left side.  Surgery was  advised.   PROCEDURE:  The patient was taken to the OR and after intubation, the  neck was cleaned with DuraPrep.  Transverse incision was made and the  dissection was carried all the way down to the cervical area.  X-rays  showed that indeed we were at the level of 5-6, 6-7.  From then on, we  opened the anterior ligament at the level of 5-6 and 7 thoracic 1.  At  the level of 5-6, we found anterior osteophyte, which was removed.  We  brought the microscope and the patient had quite a bit of degenerative  disk disease.  Removal with the drill was made.  We opened the posterior  ligament and decompression of both C6 nerve roots, which were freely  tied was achieved.  At the end, we had good decompression of the spinal  cord as well as the C6 nerve root.  The  endplate was drilled and  allograft of 7 mm with autograft was inserted followed by a plate used  for screws.  At the level of C7 thoracic wall, we found the same  spondylosis.  Back to the left side, the patient had a herniated disk  compromising the same nerve root.  After decompression and removal of  the endplate, another allograft of 7 with autograft inside was inserted.  This was followed by a plate using four screws.  Lateral cervical spine  showed good position of the graft at the level of 5-6.  We barely could  see the one at the level of 7-1.  Although we accomplished hemostasis.  We left a drain in the precervical area.  The wound was closed with  Vicryl and Steri-Strips.           ______________________________  Leeroy Cha, M.D.     EB/MEDQ  D:  07/28/2008  T:  07/29/2008  Job:  [id number]

## 2010-08-13 NOTE — H&P (Signed)
NAMEYOSTIN, KRAUS NO.:  [id number]   MEDICAL RECORD NO.:  [id number]          PATIENT TYPE:  INP   LOCATION:  3014                         FACILITY:  [facility]   PHYSICIAN:  Leeroy Cha, M.D.   DATE OF BIRTH:  04/20/1960   DATE OF ADMISSION:  11/03/2007  DATE OF DISCHARGE:                              HISTORY & PHYSICAL   Mr. Pacheco is a gentleman who has been seen in my office on several  occasions complaining of back pain with radiation to both legs.  He  feels that the pain is worse in the right than the left one.  He was  seen initially in 2004, at that time we made a diagnosis of lumbar  stenosis.  Now, he is getting worse.  He has increased urinary frequency  and pain every time he has a bowel movement.  We repeated the x-ray.  He  has severe stenosis at the level L3-L4 and L4-L5.  He wants to proceed  with surgery as soon as possible.   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   He is not allergic to any medications.   He smokes a pack.  He drinks daily.   FAMILY HISTORY:  Negative.   PHYSICAL EXAMINATION:  HEAD, EARS, NOSE, AND THROAT:  Normal.  NECK:  Normal.  LUNGS:  Clear.  HEART:  Sounds normal.  ABDOMEN:  Normal.  EXTREMITIES:  Normal pulses.  NEURO:  His SLR is positive at about 60 degrees.  Femoral stretch  maneuver is positive bilaterally.  He has a decreased flexibility of  lumbar spine.   X-rays showed that he has a severe stenosis at the level L3-L4 and L4-  L5.   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   RECOMMENDATIONS:  The patient is being admitted for surgery.  Procedure  will be bilateral laminectomy at the level L3-L4 and L4-L5 with possible  posterolateral fusion using BMP and autograft.  The patient knows about  the risks such as infection, CSF leak, worsening pain, and need for  surgery.           ______________________________  Leeroy Cha,  M.D.     EB/MEDQ  D:  11/03/2007  T:  11/04/2007  Job:  [id number]

## 2010-08-13 NOTE — Op Note (Signed)
NAMETERRANCE, SOTOMAYOR             ACCOUNT NO.:  [id number]   MEDICAL RECORD NO.:  [id number]          PATIENT TYPE:  INP   LOCATION:  3014                         FACILITY:  [facility]   PHYSICIAN:  Leeroy Cha, M.D.   DATE OF BIRTH:  03-May-1960   DATE OF PROCEDURE:  11/03/2007  DATE OF DISCHARGE:                               OPERATIVE REPORT   PREOPERATIVE DIAGNOSES:  L3-L4 stenosis, synovial cyst, and neurogenic  claudication.   POSTOPERATIVE DIAGNOSES:  L3-L4 stenosis, synovial cyst, and neurogenic  claudication.   PROCEDURE:  Bilateral L3-L4 laminectomy, decompression of the thecal  sac, removal of bilateral synovial cyst.  Posterolateral arthrodesis  with autograft and BMP.  Microscope.   SURGEON:  Leeroy Cha, MD   ASSISTANT:  Hosie Spangle, MD   CLINICAL HISTORY:  The patient was seen by me on several occasions in my  office complaining of back pain radiation to both legs.  Two days ago,  he came to my office telling me that the pain was worse in the right leg  than the left one.  X-rays showed severe stenosis, worse on the level of  3-4 with synovial cyst.  Surgery was advised.   PROCEDURE:  The patient was taken to the OR, and after intubation he was  positioned in prone manner.  The skin was cleaned with DuraPrep.  X-rays  showed that the tip of the needle was at L3.  From then on, we opened up  from L3 to L4 and we retracted the muscle laterally until we were able  to see the beginning of the transverse process.  Repeat x-rays showed  that again we were at L3-L4.  From then on, with the Leksell, we removed  the spinous process of the L3-L4 and we proceeded with the drill,  removing the lamina.  The patient had quite a bit of stenosis with  almost complete obliteration of the spinal canal.  The patient had thick  yellow ligament plus 2 large cysts.  We brought the microscope into the  area, and doing microdissection we were able to remove the cysts  bilaterally.  That gave [name] plenty of room to continue with laminectomy  to decompress the area between L2-L3 and L4-L5.  Having good  decompression and having good opening of the foramen, we went laterally  and we drilled the lateral aspect of the facet and transverse process  and a mixture of autograft and BMP was used for arthrodesis.  Fentanyl  was left in the epidural space, and the wound was closed with Vicryl and  Steri-Strips.            ______________________________  Leeroy Cha, M.D.     EB/MEDQ  D:  11/03/2007  T:  11/04/2007  Job:  [id number]

## 2010-08-13 NOTE — H&P (Signed)
[name], [name]             ACCOUNT NO.:  [id number]   MEDICAL RECORD NO.:  [id number]          PATIENT TYPE:  INP   LOCATION:  3016                         FACILITY:  [facility]   PHYSICIAN:  Leeroy Cha, M.D.   DATE OF BIRTH:  1960/08/24   DATE OF ADMISSION:  06/02/2008  DATE OF DISCHARGE:                              HISTORY & PHYSICAL   HISTORY OF PRESENT ILLNESS:  [name] is a gentleman who in the past  underwent decompressive laminectomy at the level of C3-4 and C4-5 with  cauda equina syndrome.  The patient did really well, but lately he had  been complaining of neck pain with rotation to the left side; burning  sensation going to thumb, index fingers; and pain in the lower extremity  going to both the legs.  The patient had a myelogram and because of  findings, he is being admitted for surgery.   PAST MEDICAL HISTORY:  Lumbar laminectomy with posterolateral  arthrodesis at L3-4 and L4-5.  He had a tumor of the left testicle.   He is not allergic to any medication.   FAMILY HISTORY:  Negative.   SOCIAL HISTORY:  He smokes and drinks.   PHYSICAL EXAMINATION:  HEAD, EARS, NOSE, AND THROAT:  Normal.  NECK:  He has a decreased flexibility secondary to pain, rotation to the  left side.  Positive burning sensation in both of the thumb, index  finger.  LUNGS:  Clear.  CARDIAC:  Heart sounds normal.  ABDOMEN:  Normal.  EXTREMITIES:  Normal pulse.  He has a scar in the lumbar area.  NEUROLOGIC:  Straight leg raising,  is positive at 80 degrees.  Femoral  stretch maneuver highly positive bilateral.  The patient is able to walk  in tip toes and heels, but he has difficulty squatting.   Lumbar myelogram showed severe stenosis at the level of L2-3, which was  not present before.   CLINICAL IMPRESSION:  Lumbar stenosis L2-3, status post laminectomy L3,  4, and 5.   RECOMMENDATIONS:  The patient is being admitted for surgery and we are  going to proceed with L2  laminectomy.  During surgery, we will make a  decision about having  to do any posterolateral arthrodesis.  He knows  about the risks such infection, CSF leak, especially the procedure will  not correct his cervical problem.           ______________________________  Leeroy Cha, M.D.    EB/MEDQ  D:  06/02/2008  T:  06/03/2008  Job:  [id number]

## 2010-08-13 NOTE — Op Note (Signed)
NAMEFINNAN, WONG             ACCOUNT NO.:  [id number]   MEDICAL RECORD NO.:  [id number]          PATIENT TYPE:  INP   LOCATION:  3016                         FACILITY:  [facility]   PHYSICIAN:  Leeroy Cha, M.D.   DATE OF BIRTH:  Oct 18, 1960   DATE OF PROCEDURE:  06/02/2008  DATE OF DISCHARGE:                               OPERATIVE REPORT   PREOPERATIVE DIAGNOSIS:  L2-L3 stenosis.   POSTOPERATIVE DIAGNOSES:  L2-L3 stenosis.   PROCEDURE:  L2 laminectomy, decompression of the L2-L3 space,  foraminotomy, autograft laterally.  Microscope.   SURGEON:  Leeroy Cha, MD   ASSISTANT:  Ophelia Charter, MD.   CLINICAL HISTORY:  Mr. Dixon is a 50 years old gentleman who  underwent decompression of the level of 3-4 back last year.  He had  posterolateral arthrodesis.  The patient did well, but lately he had  been complaining of pain that is mostly localized to the quadriceps.  He  has weakness of the quadriceps and the  femoral stretch maneuver was  negative.  He had no weakness distally.  X-rays showed that he has  stenosis at the level of 2-3 and some narrowing in the previous area of  surgery.  Clinically, the patient behaves as an upper  stenosis.  Surgery was advised.  Besides that, the patient is complaining of quite  a bit of neck pain.  He has severe case of severe cervical spondylosis.  Surgery was advised, and the patient was fully aware that his probably  will have nothing to do with the cervical area.   PROCEDURE:  The patient was taken to the OR.  He was positioned in prone  manner.  The back was cleaned with DuraPrep.  A midline incision from  the upper L2 down to about  4 inches below fromwhere the previous scar  was made.  Incision was made laterally.  We found the spinous process of  L2 and retraction was done until we were able to visualize the lateral  aspect of the facet of L2.  Then, we went to the midline.  The patient  had quite a bit of fibrosis in the  area where he had the previous  laminectomy at L3.  Then, with the Leksell we removed the spinous  process of L2 and the lamina of 3.  We drilled the lamina all the way  laterally until we had good space.  The patient had quite a bit of  thickening of the  ligament between L2-L1 and L2-L3.  At the level of 2-  3, he has mostly scar tissue.  A thick yellow ligament at the level of  L1 and 2 was removed.  Foraminotomy was accomplished with decompression  of the L1 and L2 nerve root.  Then, we went into the  midline to remove  the fibrotic tissue until we were able to go down to the previous area  where he had the surgery.  Opening of the space was done.  At the end,  we had good  decompression of the L1-2 space and L2-L3 space.  The area was  irrigated.  Two x-rays were done during surgery.  Then, fentanyl and  Depo-Medrol were left in the epidural space and the wound was closed  with Vicryl and Steri-Strips.            ______________________________  Leeroy Cha, M.D.     EB/MEDQ  D:  06/02/2008  T:  06/03/2008  Job:  [id number]

## 2010-08-13 NOTE — H&P (Signed)
NAMEOBAMA, GARNO             ACCOUNT NO.:  [id number]   MEDICAL RECORD NO.:  [id number]          PATIENT TYPE:  OIB   LOCATION:  3007                         FACILITY:  [facility]   PHYSICIAN:  Leeroy Cha, M.D.   DATE OF BIRTH:  1960-08-26   DATE OF ADMISSION:  07/28/2008  DATE OF DISCHARGE:                              HISTORY & PHYSICAL   Ms. Hatheway is a lady who had been complaining of neck pain radiation  to the right going to the left arm since he had an accident several  months ago.  He previously had a lumber laminectomy for cauda equina.  The pain is getting worse in the neck.  He complained of numbness which  involved the thumb, index, middle finger of the right hand and the  fourth and fifth finger of the left hand.  He had quite a bit of pain  with movement of the neck.  The patient has x-ray including MRI which  showed a herniated disk at L5-6 bilaterally and then at the level of C6,  C7, T1 at the left side.  Because of no improvement, he is being  admitted for surgery.   PAST MEDICAL HISTORY:  Lumbar laminectomy x2.  He is to have a tumor  removed from the left testicle.   MEDICATIONS:  He is not allergic to medications.   SOCIAL HISTORY:  He drinks socially.  He smokes a pack a day.   REVIEW OF SYSTEMS:  Positive for neck pain.   PHYSICAL EXAMINATION:  HEAD, EARS, NOSE, AND THROAT:  Normal.  NECK:  He has decreased flexion of the lumbar spine.  LUNGS:  Clear.  HEART:  Sounds normal.  ABDOMEN:  Normal.  EXTREMITIES:  Normal pulses.  NEUROLOGIC:  He has weakness of the biceps and mild weakness of the  hypothenar muscle and weakness of the left triceps.  Clinically, he has  some numbness which involves the thumb, index, and middle finger of the  right hand and fourth and fifth finger of the left hand.  The cervical  spine x-ray and MRI showed that he has a herniated disk at L5-6  bilaterally, worse on the right side and at the level of cervical seven  and  thoracic one on the left.   IMPRESSION:  C5-C6, C7-T1 herniated disk.   RECOMMENDATIONS:  The patient is being admitted for surgery.  The  procedure will be anterior diskectomy at level of C5-6, and C7-T1.  Patient knows  the risks such as infection, CSF leak, worsening pain,  paralysis, need for further surgery.            ______________________________  Leeroy Cha, M.D.    EB/MEDQ  D:  07/28/2008  T:  07/28/2008  Job:  [id number]

## 2010-10-15 ENCOUNTER — Other Ambulatory Visit (HOSPITAL_COMMUNITY): Payer: Self-pay | Admitting: Urology

## 2010-10-15 DIAGNOSIS — N390 Urinary tract infection, site not specified: Secondary | ICD-10-CM

## 2010-10-25 ENCOUNTER — Ambulatory Visit (HOSPITAL_COMMUNITY)
Admission: RE | Admit: 2010-10-25 | Discharge: 2010-10-25 | Disposition: A | Discharge status: Home / Self Care | Payer: Medicare Other | Source: Ambulatory Visit | Attending: Urology | Admitting: Urology

## 2010-10-25 DIAGNOSIS — R1032 Left lower quadrant pain: Secondary | ICD-10-CM | POA: Insufficient documentation

## 2010-10-25 DIAGNOSIS — R1031 Right lower quadrant pain: Secondary | ICD-10-CM | POA: Insufficient documentation

## 2010-10-25 DIAGNOSIS — J984 Other disorders of lung: Secondary | ICD-10-CM | POA: Insufficient documentation

## 2010-10-25 DIAGNOSIS — N390 Urinary tract infection, site not specified: Secondary | ICD-10-CM | POA: Insufficient documentation

## 2010-11-04 ENCOUNTER — Ambulatory Visit (HOSPITAL_COMMUNITY)
Admission: RE | Admit: 2010-11-04 | Discharge: 2010-11-04 | Disposition: A | Discharge status: Home / Self Care | Payer: Medicare Other | Source: Ambulatory Visit | Attending: Family Medicine | Admitting: Family Medicine

## 2010-11-04 ENCOUNTER — Other Ambulatory Visit (HOSPITAL_COMMUNITY): Payer: Self-pay | Admitting: Family Medicine

## 2010-11-04 DIAGNOSIS — R059 Cough, unspecified: Secondary | ICD-10-CM

## 2010-11-04 DIAGNOSIS — R05 Cough: Secondary | ICD-10-CM

## 2010-11-04 DIAGNOSIS — R079 Chest pain, unspecified: Secondary | ICD-10-CM | POA: Insufficient documentation

## 2010-12-27 LAB — TYPE AND SCREEN
ABO/RH(D): A POS
Antibody Screen: NEGATIVE

## 2010-12-27 LAB — CBC
HCT: 41.2
Hemoglobin: 13.9
MCHC: 33.6
MCV: 101.2 — ABNORMAL HIGH
Platelets: 334
RBC: 4.07 — ABNORMAL LOW
RDW: 13.7
WBC: 7.9

## 2010-12-27 LAB — ABO/RH: ABO/RH(D): A POS

## 2010-12-31 LAB — DIFFERENTIAL
Basophils Absolute: 0
Basophils Relative: 0
Eosinophils Absolute: 0.3
Eosinophils Relative: 3
Lymphocytes Relative: 23
Lymphs Abs: 2.7
Monocytes Absolute: 1.1 — ABNORMAL HIGH
Monocytes Relative: 9
Neutro Abs: 7.5
Neutrophils Relative %: 65

## 2010-12-31 LAB — COMPREHENSIVE METABOLIC PANEL
ALT: 17
AST: 19
Albumin: 3.8
Alkaline Phosphatase: 75
BUN: 5 — ABNORMAL LOW
CO2: 27
Calcium: 9.1
Chloride: 101
Creatinine, Ser: 0.96
GFR calc Af Amer: >60
GFR calc non Af Amer: >60
Glucose, Bld: 90
Potassium: 3.5
Sodium: 136
Total Bilirubin: 0.6
Total Protein: 7.2

## 2010-12-31 LAB — LIPASE, BLOOD: Lipase: 16

## 2010-12-31 LAB — URINALYSIS, ROUTINE W REFLEX MICROSCOPIC
Bilirubin Urine: NEGATIVE
Glucose, UA: NEGATIVE
Ketones, ur: NEGATIVE
Nitrite: POSITIVE — AB
Protein, ur: NEGATIVE
Specific Gravity, Urine: <1.005 — ABNORMAL LOW
Urobilinogen, UA: 0.2
pH: 6

## 2010-12-31 LAB — CBC
HCT: 40
Hemoglobin: 13.6
MCHC: 33.8
MCV: 95.9
Platelets: 269
RBC: 4.18 — ABNORMAL LOW
RDW: 14
WBC: 11.6 — ABNORMAL HIGH

## 2010-12-31 LAB — URINE CULTURE: Colony Count: 50000

## 2010-12-31 LAB — URINE MICROSCOPIC-ADD ON

## 2012-07-20 ENCOUNTER — Telehealth: Payer: Self-pay | Admitting: Cardiovascular Disease

## 2012-07-20 NOTE — Telephone Encounter (Signed)
Spoke with pt. Pt states he went bowling last night and woke up this morning with pain in his left thigh. He states the pain is moving up his thigh and feels like he needs a hip replacement. Pt is concerned about a blood clot in his leg. Pt denies discoloration, swelling, numbness, coldness in his left foot or leg. I reviewed with Dr Burt Knack. He recommended pt contact his PCP for recommendations. LMTCB for pt.

## 2012-07-20 NOTE — Telephone Encounter (Signed)
New problem     [place]  C/O leg hurting at the groin area .  Took an ASA. Took a pain pill on yesterday. Would like to be seen today

## 2012-07-20 NOTE — Telephone Encounter (Signed)
Received call from patient he stated he had another severe pain in left thigh.Patient was told Dr.Cooper advised need to contact PCP for recommendations.

## 2012-08-05 ENCOUNTER — Other Ambulatory Visit (HOSPITAL_COMMUNITY): Payer: Self-pay | Admitting: Urology

## 2012-08-05 DIAGNOSIS — N419 Inflammatory disease of prostate, unspecified: Secondary | ICD-10-CM

## 2012-08-11 ENCOUNTER — Ambulatory Visit (HOSPITAL_COMMUNITY): Payer: Medicare Other

## 2012-08-12 ENCOUNTER — Ambulatory Visit (HOSPITAL_COMMUNITY)
Admission: RE | Admit: 2012-08-12 | Discharge: 2012-08-12 | Disposition: A | Discharge status: Home / Self Care | Payer: Medicare Other | Source: Ambulatory Visit | Attending: Urology | Admitting: Urology

## 2012-08-12 DIAGNOSIS — R634 Abnormal weight loss: Secondary | ICD-10-CM | POA: Insufficient documentation

## 2012-08-12 DIAGNOSIS — N419 Inflammatory disease of prostate, unspecified: Secondary | ICD-10-CM | POA: Insufficient documentation

## 2012-08-12 DIAGNOSIS — R9389 Abnormal findings on diagnostic imaging of other specified body structures: Secondary | ICD-10-CM | POA: Insufficient documentation

## 2012-08-12 DIAGNOSIS — R1032 Left lower quadrant pain: Secondary | ICD-10-CM | POA: Insufficient documentation

## 2014-04-21 ENCOUNTER — Ambulatory Visit: Payer: Medicare Other | Admitting: Urology

## 2014-06-02 ENCOUNTER — Ambulatory Visit (INDEPENDENT_AMBULATORY_CARE_PROVIDER_SITE_OTHER): Payer: Medicare Other | Admitting: Urology

## 2014-06-02 DIAGNOSIS — R6882 Decreased libido: Secondary | ICD-10-CM | POA: Diagnosis not present

## 2014-06-02 DIAGNOSIS — R3915 Urgency of urination: Secondary | ICD-10-CM | POA: Diagnosis not present

## 2014-06-02 DIAGNOSIS — R351 Nocturia: Secondary | ICD-10-CM | POA: Diagnosis not present

## 2014-07-06 ENCOUNTER — Other Ambulatory Visit: Payer: Self-pay | Admitting: Neurosurgery

## 2014-07-06 DIAGNOSIS — M5416 Radiculopathy, lumbar region: Secondary | ICD-10-CM

## 2014-07-19 ENCOUNTER — Other Ambulatory Visit: Payer: Medicare Other

## 2014-09-01 ENCOUNTER — Ambulatory Visit (INDEPENDENT_AMBULATORY_CARE_PROVIDER_SITE_OTHER): Payer: Medicare Other | Admitting: Urology

## 2014-09-01 DIAGNOSIS — R351 Nocturia: Secondary | ICD-10-CM | POA: Diagnosis not present

## 2014-09-01 DIAGNOSIS — R3915 Urgency of urination: Secondary | ICD-10-CM | POA: Diagnosis not present

## 2015-01-09 ENCOUNTER — Other Ambulatory Visit: Payer: Self-pay | Admitting: Neurosurgery

## 2015-01-09 DIAGNOSIS — M5416 Radiculopathy, lumbar region: Secondary | ICD-10-CM

## 2015-01-17 ENCOUNTER — Ambulatory Visit
Admission: RE | Admit: 2015-01-17 | Discharge: 2015-01-17 | Disposition: A | Discharge status: Home / Self Care | Payer: Medicare Other | Source: Ambulatory Visit | Attending: Neurosurgery | Admitting: Neurosurgery

## 2015-01-17 DIAGNOSIS — M5416 Radiculopathy, lumbar region: Secondary | ICD-10-CM

## 2015-01-17 MED ORDER — DIAZEPAM 5 MG PO TABS
10.0000 mg | ORAL_TABLET | Freq: Once | ORAL | Status: AC
  Administered 2015-01-17: 10 mg via ORAL

## 2015-01-17 MED ORDER — IOHEXOL 180 MG/ML  SOLN
15.0000 mL | Freq: Once | INTRAMUSCULAR | Status: DC | PRN
  Administered 2015-01-17: 15 mL via INTRATHECAL

## 2015-01-17 NOTE — Discharge Instructions (Signed)

## 2015-02-13 ENCOUNTER — Other Ambulatory Visit: Payer: Self-pay | Admitting: Neurosurgery

## 2015-02-13 DIAGNOSIS — M48061 Spinal stenosis, lumbar region without neurogenic claudication: Secondary | ICD-10-CM

## 2015-02-20 ENCOUNTER — Ambulatory Visit
Admission: RE | Admit: 2015-02-20 | Discharge: 2015-02-20 | Disposition: A | Discharge status: Home / Self Care | Payer: Medicare Other | Source: Ambulatory Visit | Attending: Neurosurgery | Admitting: Neurosurgery

## 2015-02-20 DIAGNOSIS — M48061 Spinal stenosis, lumbar region without neurogenic claudication: Secondary | ICD-10-CM

## 2015-02-20 MED ORDER — IOHEXOL 180 MG/ML  SOLN
1.0000 mL | Freq: Once | INTRAMUSCULAR | Status: DC | PRN
  Administered 2015-02-20: 1 mL via INTRAVENOUS

## 2015-02-20 MED ORDER — METHYLPREDNISOLONE ACETATE 40 MG/ML INJ SUSP (RADIOLOG
120.0000 mg | Freq: Once | INTRAMUSCULAR | Status: AC
  Administered 2015-02-20: 120 mg via EPIDURAL

## 2015-02-20 NOTE — Discharge Instructions (Signed)

## 2015-07-09 ENCOUNTER — Other Ambulatory Visit: Payer: Self-pay | Admitting: Neurosurgery

## 2015-07-18 ENCOUNTER — Encounter (HOSPITAL_COMMUNITY)
Admission: RE | Admit: 2015-07-18 | Discharge: 2015-07-18 | Disposition: A | Discharge status: Home / Self Care | Payer: Medicare Other | Source: Ambulatory Visit | Attending: Neurosurgery | Admitting: Neurosurgery

## 2015-07-18 ENCOUNTER — Encounter (HOSPITAL_COMMUNITY): Payer: Self-pay

## 2015-07-18 DIAGNOSIS — Z01812 Encounter for preprocedural laboratory examination: Secondary | ICD-10-CM | POA: Diagnosis not present

## 2015-07-18 DIAGNOSIS — Z0183 Encounter for blood typing: Secondary | ICD-10-CM | POA: Diagnosis not present

## 2015-07-18 DIAGNOSIS — M4806 Spinal stenosis, lumbar region: Secondary | ICD-10-CM | POA: Insufficient documentation

## 2015-07-18 HISTORY — DX: Malignant (primary) neoplasm, unspecified: C80.1

## 2015-07-18 HISTORY — DX: Urinary calculus, unspecified: N20.9

## 2015-07-18 HISTORY — DX: Unspecified osteoarthritis, unspecified site: M19.90

## 2015-07-18 LAB — CBC
HCT: 41 % (ref 39.0–52.0)
Hemoglobin: 13.6 g/dL (ref 13.0–17.0)
MCH: 32.2 pg (ref 26.0–34.0)
MCHC: 33.2 g/dL (ref 30.0–36.0)
MCV: 97.2 fL (ref 78.0–100.0)
Platelets: 263 10*3/uL (ref 150–400)
RBC: 4.22 MIL/uL (ref 4.22–5.81)
RDW: 14.6 % (ref 11.5–15.5)
WBC: 7.4 10*3/uL (ref 4.0–10.5)

## 2015-07-18 LAB — COMPREHENSIVE METABOLIC PANEL
ALT: 18 U/L (ref 17–63)
AST: 22 U/L (ref 15–41)
Albumin: 3.6 g/dL (ref 3.5–5.0)
Alkaline Phosphatase: 74 U/L (ref 38–126)
Anion gap: 9 (ref 5–15)
BUN: 7 mg/dL (ref 6–20)
CO2: 29 mmol/L (ref 22–32)
Calcium: 9.5 mg/dL (ref 8.9–10.3)
Chloride: 101 mmol/L (ref 101–111)
Creatinine, Ser: 1.05 mg/dL (ref 0.61–1.24)
GFR calc Af Amer: >60 mL/min (ref >60)
GFR calc non Af Amer: >60 mL/min (ref >60)
Glucose, Bld: 93 mg/dL (ref 65–99)
Potassium: 4.2 mmol/L (ref 3.5–5.1)
Sodium: 139 mmol/L (ref 135–145)
Total Bilirubin: 0.4 mg/dL (ref 0.3–1.2)
Total Protein: 7.5 g/dL (ref 6.5–8.1)

## 2015-07-18 LAB — TYPE AND SCREEN
ABO/RH(D): A POS
Antibody Screen: NEGATIVE

## 2015-07-18 LAB — ABO/RH: ABO/RH(D): A POS

## 2015-07-18 LAB — SURGICAL PCR SCREEN
MRSA, PCR: NEGATIVE
Staphylococcus aureus: NEGATIVE

## 2015-07-18 NOTE — Pre-Procedure Instructions (Addendum)
[name]  07/18/2015      [id number] - [place], [place] [address] [place] [address] Phone: [phone] Fax: [phone]  [id number] - [place], [address] AT [address] [place] [address] Phone: [phone] Fax: [phone]    Your procedure is scheduled on 07/24/15.  Report to [facility] Admitting at 600 A.M.  Call this number if you have problems the morning of surgery:  [phone]   Remember:  Do not eat food or drink liquids after midnight.  Take these medicines the morning of surgery with A SIP OF WATER oxycodone if needed,alfuzosin   STOP all herbel meds, nsaids (aleve,naproxen,advil,ibuprofen) 5 days prior to surgery now including vitamins, aspirin   Do not wear jewelry, make-up or nail polish.  Do not wear lotions, powders, or perfumes.  You may wear deodorant.  Do not shave 48 hours prior to surgery.  Men may shave face and neck.  Do not bring valuables to the hospital.  [facility] is not responsible for any belongings or valuables.  Contacts, dentures or bridgework may not be worn into surgery.  Leave your suitcase in the car.  After surgery it may be brought to your room.  For patients admitted to the hospital, discharge time will be determined by your treatment team.  Patients discharged the day of surgery will not be allowed to drive home.   Name and phone number of your driver:    Special instructions:   Special Instructions: [facility] - Preparing for Surgery  Before surgery, you can play an important role.  Because skin is not sterile, your skin needs to be as free of germs as possible.  You can reduce the number of germs on you skin by washing with CHG (chlorahexidine gluconate) soap before surgery.  CHG is an antiseptic cleaner which kills germs and bonds with the skin to continue killing germs even  after washing.  Please DO NOT use if you have an allergy to CHG or antibacterial soaps.  If your skin becomes reddened/irritated stop using the CHG and inform your nurse when you arrive at Short Stay.  Do not shave (including legs and underarms) for at least 48 hours prior to the first CHG shower.  You may shave your face.  Please follow these instructions carefully:   1.  Shower with CHG Soap the night before surgery and the morning of Surgery.  2.  If you choose to wash your hair, wash your hair first as usual with your normal shampoo.  3.  After you shampoo, rinse your hair and body thoroughly to remove the Shampoo.  4.  Use CHG as you would any other liquid soap.  You can apply chg directly  to the skin and wash gently with scrungie or a clean washcloth.  5.  Apply the CHG Soap to your body ONLY FROM THE NECK DOWN.  Do not use on open wounds or open sores.  Avoid contact with your eyes ears, mouth and genitals (private parts).  Wash genitals (private parts)       with your normal soap.  6.  Wash thoroughly, paying special attention to the area where your surgery will be performed.  7.  Thoroughly rinse your body with warm water from the neck down.  8.  DO NOT shower/wash with your normal soap after  using and rinsing off the CHG Soap.  9.  Pat yourself dry with a clean towel.            10.  Wear clean pajamas.            11.  Place clean sheets on your bed the night of your first shower and do not sleep with pets.  Day of Surgery  Do not apply any lotions/deodorants the morning of surgery.  Please wear clean clothes to the hospital/surgery center.  Please read over the following fact sheets that you were given. Pain Booklet, Coughing and Deep Breathing, Blood Transfusion Information, MRSA Information and Surgical Site Infection Prevention

## 2015-07-23 MED ORDER — CEFAZOLIN SODIUM-DEXTROSE 2-4 GM/100ML-% IV SOLN
2.0000 g | INTRAVENOUS | Status: AC
  Administered 2015-07-24: 2 g via INTRAVENOUS
  Filled 2015-07-23: qty 100

## 2015-07-23 NOTE — H&P (Signed)
[name] is an 55 y.o. male.   Chief Complaint: bilateral leg pain HPI: patient who has been complaining of lumbar pain with bilateral extension to both thighs associated with weakness no better with conservative treatment including epidurals.   Past Medical History  Diagnosis Date  . Stones, urinary tract   . Arthritis   . 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 Cervical disc surgery      x2  . Back surgery      x3  . Tonsillectomy      No family history on file. Social History:  reports that he has been smoking Cigarettes.  He has a 40 pack-year smoking history. He does not have any smokeless tobacco history on file. He reports that he does not drink alcohol. His drug history is not on file.  Allergies:  Allergies  Allergen Reactions  . Acetaminophen Nausea Only and Other (See Comments)    Elevated liver enzymes  . Imodium [Loperamide] Swelling    facial  . Morphine Other (See Comments)    Bradycardia   . Aleve [Naproxen] Swelling    eye  . Diclofenac Sodium     Patient cannot remember reaction  . Vioxx [Rofecoxib]     Does not remember    No prescriptions prior to admission    No results found for this or any previous visit (from the past 48 hour(s)). No results found.  Review of Systems  Constitutional: Negative.   HENT: Negative.   Eyes: Negative.   Respiratory: Negative.   Cardiovascular: Negative.   Gastrointestinal: Negative.   Genitourinary: Negative.   Musculoskeletal: Positive for back pain.  Skin: Negative.   Neurological: Positive for sensory change and focal weakness.  Endo/Heme/Allergies: Negative.   Psychiatric/Behavioral: Negative.        PTSD    There were no vitals taken for this visit. Physical Exam hent, nl. Neck, anterior scar. Cv, nl. Lungs,clear. Abdomen, nl. Extremities, nl. NEURO  Femoral stretch maneuver is positive bilaterally.  Weakness of ileopsoas. dtr wnl.  Lumbar  myelogram showed a l1-2 stenosis   Assessment/Plan Decompression and fusion at l1-2  With pedicle screws and possible cages. He is aware of risks and benefits  Floyce Stakes, MD 07/23/2015, 4:32 PM

## 2015-07-24 ENCOUNTER — Encounter (HOSPITAL_COMMUNITY)
Admission: RE | Disposition: A | Discharge status: Home / Self Care | Payer: Self-pay | Source: Ambulatory Visit | Attending: Neurosurgery

## 2015-07-24 ENCOUNTER — Inpatient Hospital Stay (HOSPITAL_COMMUNITY): Payer: Medicare Other

## 2015-07-24 ENCOUNTER — Encounter (HOSPITAL_COMMUNITY): Payer: Self-pay | Admitting: Certified Registered Nurse Anesthetist

## 2015-07-24 ENCOUNTER — Inpatient Hospital Stay (HOSPITAL_COMMUNITY): Payer: Medicare Other | Admitting: Anesthesiology

## 2015-07-24 ENCOUNTER — Inpatient Hospital Stay (HOSPITAL_COMMUNITY): Payer: Medicare Other | Admitting: Vascular Surgery

## 2015-07-24 ENCOUNTER — Inpatient Hospital Stay (HOSPITAL_COMMUNITY)
Admission: RE | Admit: 2015-07-24 | Discharge: 2015-07-26 | DRG: 460 | Disposition: A | Discharge status: Home / Self Care | Payer: Medicare Other | Source: Ambulatory Visit | Attending: Neurosurgery | Admitting: Neurosurgery

## 2015-07-24 DIAGNOSIS — M79606 Pain in leg, unspecified: Secondary | ICD-10-CM | POA: Diagnosis present

## 2015-07-24 DIAGNOSIS — Z888 Allergy status to other drugs, medicaments and biological substances status: Secondary | ICD-10-CM

## 2015-07-24 DIAGNOSIS — Z981 Arthrodesis status: Secondary | ICD-10-CM

## 2015-07-24 DIAGNOSIS — M4806 Spinal stenosis, lumbar region: Principal | ICD-10-CM | POA: Diagnosis present

## 2015-07-24 DIAGNOSIS — F17210 Nicotine dependence, cigarettes, uncomplicated: Secondary | ICD-10-CM | POA: Diagnosis present

## 2015-07-24 DIAGNOSIS — Z419 Encounter for procedure for purposes other than remedying health state, unspecified: Secondary | ICD-10-CM

## 2015-07-24 DIAGNOSIS — Z885 Allergy status to narcotic agent status: Secondary | ICD-10-CM

## 2015-07-24 DIAGNOSIS — Z886 Allergy status to analgesic agent status: Secondary | ICD-10-CM

## 2015-07-24 DIAGNOSIS — F4310 Post-traumatic stress disorder, unspecified: Secondary | ICD-10-CM | POA: Diagnosis present

## 2015-07-24 DIAGNOSIS — M48061 Spinal stenosis, lumbar region without neurogenic claudication: Secondary | ICD-10-CM | POA: Diagnosis present

## 2015-07-24 SURGERY — POSTERIOR LUMBAR FUSION 1 LEVEL
Anesthesia: General | Site: Back

## 2015-07-24 MED ORDER — SUGAMMADEX SODIUM 200 MG/2ML IV SOLN
INTRAVENOUS | Status: DC | PRN
  Administered 2015-07-24: 200 mg via INTRAVENOUS

## 2015-07-24 MED ORDER — MIDAZOLAM HCL 2 MG/2ML IJ SOLN
INTRAMUSCULAR | Status: AC
  Filled 2015-07-24: qty 2

## 2015-07-24 MED ORDER — PROPOFOL 10 MG/ML IV BOLUS
INTRAVENOUS | Status: DC | PRN
Start: 2015-07-24 — End: 2015-07-24
  Administered 2015-07-24: 180 mg via INTRAVENOUS

## 2015-07-24 MED ORDER — SODIUM CHLORIDE 0.9% FLUSH: 9.0000 mL | INTRAVENOUS | Status: DC | PRN

## 2015-07-24 MED ORDER — PROPOFOL 10 MG/ML IV BOLUS
INTRAVENOUS | Status: AC
  Filled 2015-07-24: qty 20

## 2015-07-24 MED ORDER — MENTHOL 3 MG MT LOZG: 1.0000 | LOZENGE | OROMUCOSAL | Status: DC | PRN

## 2015-07-24 MED ORDER — FENTANYL CITRATE (PF) 250 MCG/5ML IJ SOLN
INTRAMUSCULAR | Status: AC
  Filled 2015-07-24: qty 5

## 2015-07-24 MED ORDER — PHENOL 1.4 % MT LIQD
1.0000 | OROMUCOSAL | Status: DC | PRN
Start: 2015-07-24 — End: 2015-07-26

## 2015-07-24 MED ORDER — THROMBIN 20000 UNITS EX SOLR
CUTANEOUS | Status: DC | PRN
  Administered 2015-07-24: 10:00:00 via TOPICAL

## 2015-07-24 MED ORDER — SODIUM CHLORIDE 0.9 % IV SOLN
INTRAVENOUS | Status: DC
  Administered 2015-07-24: 23:00:00 via INTRAVENOUS

## 2015-07-24 MED ORDER — DIPHENHYDRAMINE HCL 12.5 MG/5ML PO ELIX: 12.5000 mg | ORAL_SOLUTION | Freq: Four times a day (QID) | ORAL | Status: DC | PRN

## 2015-07-24 MED ORDER — FENTANYL CITRATE (PF) 100 MCG/2ML IJ SOLN
25.0000 ug | INTRAMUSCULAR | Status: DC | PRN
  Administered 2015-07-24 (×3): 50 ug via INTRAVENOUS

## 2015-07-24 MED ORDER — DIAZEPAM 5 MG PO TABS
5.0000 mg | ORAL_TABLET | Freq: Four times a day (QID) | ORAL | Status: DC | PRN
  Administered 2015-07-24: 5 mg via ORAL

## 2015-07-24 MED ORDER — SENNA 8.6 MG PO TABS: 1.0000 | ORAL_TABLET | Freq: Two times a day (BID) | ORAL | Status: DC

## 2015-07-24 MED ORDER — ACETAMINOPHEN 650 MG RE SUPP: 650.0000 mg | RECTAL | Status: DC | PRN

## 2015-07-24 MED ORDER — DIPHENHYDRAMINE HCL 50 MG/ML IJ SOLN: 12.5000 mg | Freq: Four times a day (QID) | INTRAMUSCULAR | Status: DC | PRN

## 2015-07-24 MED ORDER — PHENOL 1.4 % MT LIQD: 1.0000 | OROMUCOSAL | Status: DC | PRN

## 2015-07-24 MED ORDER — ZOLPIDEM TARTRATE 5 MG PO TABS: 5.0000 mg | ORAL_TABLET | Freq: Every evening | ORAL | Status: DC | PRN

## 2015-07-24 MED ORDER — ROCURONIUM BROMIDE 50 MG/5ML IV SOLN
INTRAVENOUS | Status: AC
  Filled 2015-07-24: qty 1

## 2015-07-24 MED ORDER — SODIUM CHLORIDE 0.9 % IV SOLN: 250.0000 mL | INTRAVENOUS | Status: DC

## 2015-07-24 MED ORDER — FENTANYL 40 MCG/ML IV SOLN
INTRAVENOUS | Status: AC
  Filled 2015-07-24: qty 25

## 2015-07-24 MED ORDER — FENTANYL CITRATE (PF) 100 MCG/2ML IJ SOLN
INTRAMUSCULAR | Status: AC
  Filled 2015-07-24: qty 2

## 2015-07-24 MED ORDER — ZOLPIDEM TARTRATE 5 MG PO TABS
5.0000 mg | ORAL_TABLET | Freq: Every evening | ORAL | Status: DC | PRN
Start: 2015-07-24 — End: 2015-07-26

## 2015-07-24 MED ORDER — ROCURONIUM BROMIDE 100 MG/10ML IV SOLN
INTRAVENOUS | Status: DC | PRN
Start: 2015-07-24 — End: 2015-07-24
  Administered 2015-07-24: 50 mg via INTRAVENOUS
  Administered 2015-07-24: 30 mg via INTRAVENOUS

## 2015-07-24 MED ORDER — FENTANYL CITRATE (PF) 250 MCG/5ML IJ SOLN
INTRAMUSCULAR | Status: DC | PRN
  Administered 2015-07-24 (×2): 50 ug via INTRAVENOUS
  Administered 2015-07-24 (×2): 100 ug via INTRAVENOUS
  Administered 2015-07-24 (×2): 50 ug via INTRAVENOUS

## 2015-07-24 MED ORDER — CEFAZOLIN SODIUM 1-5 GM-% IV SOLN
1.0000 g | Freq: Three times a day (TID) | INTRAVENOUS | Status: AC
  Administered 2015-07-24 – 2015-07-25 (×2): 1 g via INTRAVENOUS
  Filled 2015-07-24 (×2): qty 50

## 2015-07-24 MED ORDER — BACITRACIN ZINC 500 UNIT/GM EX OINT
TOPICAL_OINTMENT | CUTANEOUS | Status: DC | PRN
  Administered 2015-07-24: 1 via TOPICAL

## 2015-07-24 MED ORDER — DEXAMETHASONE SODIUM PHOSPHATE 10 MG/ML IJ SOLN
INTRAMUSCULAR | Status: AC
  Filled 2015-07-24: qty 1

## 2015-07-24 MED ORDER — DEXAMETHASONE SODIUM PHOSPHATE 4 MG/ML IJ SOLN
INTRAMUSCULAR | Status: DC | PRN
Start: 2015-07-24 — End: 2015-07-24
  Administered 2015-07-24: 10 mg via INTRAVENOUS

## 2015-07-24 MED ORDER — BUPIVACAINE LIPOSOME 1.3 % IJ SUSP
20.0000 mL | Freq: Once | INTRAMUSCULAR | Status: DC
  Filled 2015-07-24: qty 20

## 2015-07-24 MED ORDER — SENNA 8.6 MG PO TABS
1.0000 | ORAL_TABLET | Freq: Two times a day (BID) | ORAL | Status: DC
  Administered 2015-07-24 – 2015-07-26 (×4): 8.6 mg via ORAL
  Filled 2015-07-24 (×4): qty 1

## 2015-07-24 MED ORDER — SODIUM CHLORIDE 0.9% FLUSH
9.0000 mL | INTRAVENOUS | Status: DC | PRN
Start: 2015-07-24 — End: 2015-07-24

## 2015-07-24 MED ORDER — LIDOCAINE HCL (CARDIAC) 20 MG/ML IV SOLN
INTRAVENOUS | Status: AC
  Filled 2015-07-24: qty 5

## 2015-07-24 MED ORDER — MIDAZOLAM HCL 5 MG/5ML IJ SOLN
INTRAMUSCULAR | Status: DC | PRN
  Administered 2015-07-24: 2 mg via INTRAVENOUS

## 2015-07-24 MED ORDER — ONDANSETRON HCL 4 MG/2ML IJ SOLN
4.0000 mg | Freq: Once | INTRAMUSCULAR | Status: DC | PRN
Start: 2015-07-24 — End: 2015-07-24

## 2015-07-24 MED ORDER — DIAZEPAM 5 MG PO TABS
5.0000 mg | ORAL_TABLET | Freq: Four times a day (QID) | ORAL | Status: DC | PRN
  Administered 2015-07-25 – 2015-07-26 (×2): 5 mg via ORAL
  Filled 2015-07-24 (×2): qty 1

## 2015-07-24 MED ORDER — VANCOMYCIN HCL 1000 MG IV SOLR
INTRAVENOUS | Status: AC
  Filled 2015-07-24: qty 2000

## 2015-07-24 MED ORDER — VANCOMYCIN HCL 1000 MG IV SOLR
INTRAVENOUS | Status: DC | PRN
  Administered 2015-07-24 (×2): 1000 mg via TOPICAL

## 2015-07-24 MED ORDER — NALOXONE HCL 0.4 MG/ML IJ SOLN: 0.4000 mg | INTRAMUSCULAR | Status: DC | PRN

## 2015-07-24 MED ORDER — ONDANSETRON HCL 4 MG/2ML IJ SOLN: 4.0000 mg | Freq: Four times a day (QID) | INTRAMUSCULAR | Status: DC | PRN

## 2015-07-24 MED ORDER — EPHEDRINE SULFATE 50 MG/ML IJ SOLN
INTRAMUSCULAR | Status: DC | PRN
  Administered 2015-07-24: 5 mg via INTRAVENOUS
  Administered 2015-07-24: 10 mg via INTRAVENOUS

## 2015-07-24 MED ORDER — ACETAMINOPHEN 325 MG PO TABS: 650.0000 mg | ORAL_TABLET | ORAL | Status: DC | PRN

## 2015-07-24 MED ORDER — SODIUM CHLORIDE 0.9% FLUSH: 3.0000 mL | INTRAVENOUS | Status: DC | PRN

## 2015-07-24 MED ORDER — PHENYLEPHRINE HCL 10 MG/ML IJ SOLN
INTRAMUSCULAR | Status: DC | PRN
  Administered 2015-07-24: 80 ug via INTRAVENOUS
  Administered 2015-07-24: 40 ug via INTRAVENOUS
  Administered 2015-07-24: 80 ug via INTRAVENOUS

## 2015-07-24 MED ORDER — FENTANYL 40 MCG/ML IV SOLN
INTRAVENOUS | Status: DC
  Administered 2015-07-24: 102 ug via INTRAVENOUS

## 2015-07-24 MED ORDER — OXYCODONE HCL 5 MG PO TABS: 15.0000 mg | ORAL_TABLET | ORAL | Status: DC | PRN

## 2015-07-24 MED ORDER — FENTANYL 40 MCG/ML IV SOLN
INTRAVENOUS | Status: DC
  Administered 2015-07-24: 13:00:00 via INTRAVENOUS

## 2015-07-24 MED ORDER — DIPHENHYDRAMINE HCL 12.5 MG/5ML PO ELIX
12.5000 mg | ORAL_SOLUTION | Freq: Four times a day (QID) | ORAL | Status: DC | PRN
Start: 2015-07-24 — End: 2015-07-24

## 2015-07-24 MED ORDER — EPHEDRINE SULFATE 50 MG/ML IJ SOLN
INTRAMUSCULAR | Status: AC
  Filled 2015-07-24: qty 1

## 2015-07-24 MED ORDER — DIAZEPAM 5 MG PO TABS
ORAL_TABLET | ORAL | Status: AC
  Filled 2015-07-24: qty 1

## 2015-07-24 MED ORDER — ONDANSETRON HCL 4 MG/2ML IJ SOLN
INTRAMUSCULAR | Status: DC | PRN
  Administered 2015-07-24: 4 mg via INTRAVENOUS

## 2015-07-24 MED ORDER — ALFUZOSIN HCL ER 10 MG PO TB24
10.0000 mg | ORAL_TABLET | Freq: Every day | ORAL | Status: DC
  Administered 2015-07-25 – 2015-07-26 (×2): 10 mg via ORAL
  Filled 2015-07-24 (×2): qty 1

## 2015-07-24 MED ORDER — ONDANSETRON HCL 4 MG/2ML IJ SOLN: 4.0000 mg | INTRAMUSCULAR | Status: DC | PRN

## 2015-07-24 MED ORDER — SUGAMMADEX SODIUM 200 MG/2ML IV SOLN
INTRAVENOUS | Status: AC
  Filled 2015-07-24: qty 2

## 2015-07-24 MED ORDER — SODIUM CHLORIDE 0.9% FLUSH: 3.0000 mL | Freq: Two times a day (BID) | INTRAVENOUS | Status: DC

## 2015-07-24 MED ORDER — LIDOCAINE HCL (CARDIAC) 20 MG/ML IV SOLN
INTRAVENOUS | Status: DC | PRN
  Administered 2015-07-24: 100 mg via INTRAVENOUS

## 2015-07-24 MED ORDER — MORPHINE SULFATE 2 MG/ML IV SOLN: INTRAVENOUS | Status: DC

## 2015-07-24 MED ORDER — SODIUM CHLORIDE 0.9% FLUSH
3.0000 mL | Freq: Two times a day (BID) | INTRAVENOUS | Status: DC
  Administered 2015-07-24 – 2015-07-25 (×2): 3 mL via INTRAVENOUS

## 2015-07-24 MED ORDER — SUCCINYLCHOLINE CHLORIDE 20 MG/ML IJ SOLN
INTRAMUSCULAR | Status: AC
  Filled 2015-07-24: qty 1

## 2015-07-24 MED ORDER — 0.9 % SODIUM CHLORIDE (POUR BTL) OPTIME
TOPICAL | Status: DC | PRN
  Administered 2015-07-24: 1000 mL

## 2015-07-24 MED ORDER — CEFAZOLIN SODIUM 1-5 GM-% IV SOLN: 1.0000 g | Freq: Three times a day (TID) | INTRAVENOUS | Status: DC

## 2015-07-24 MED ORDER — SODIUM CHLORIDE 0.9 % IV SOLN
INTRAVENOUS | Status: DC
Start: 2015-07-24 — End: 2015-07-25

## 2015-07-24 MED ORDER — OXYCODONE HCL 5 MG PO TABS
15.0000 mg | ORAL_TABLET | ORAL | Status: DC | PRN
  Administered 2015-07-25 – 2015-07-26 (×6): 15 mg via ORAL
  Filled 2015-07-24 (×6): qty 3

## 2015-07-24 MED ORDER — LACTATED RINGERS IV SOLN
INTRAVENOUS | Status: DC
  Administered 2015-07-24 (×3): via INTRAVENOUS

## 2015-07-24 MED ORDER — BUPIVACAINE LIPOSOME 1.3 % IJ SUSP
INTRAMUSCULAR | Status: DC | PRN
  Administered 2015-07-24: 20 mL

## 2015-07-24 MED ORDER — STERILE WATER FOR INJECTION IJ SOLN
INTRAMUSCULAR | Status: AC
  Filled 2015-07-24: qty 10

## 2015-07-24 MED FILL — Sodium Chloride IV Soln 0.9%: INTRAVENOUS | Qty: 1000 | Status: AC

## 2015-07-24 MED FILL — Heparin Sodium (Porcine) Inj 1000 Unit/ML: INTRAMUSCULAR | Qty: 30 | Status: AC

## 2015-07-24 SURGICAL SUPPLY — 72 items
BENZOIN TINCTURE PRP APPL 2/3 (GAUZE/BANDAGES/DRESSINGS) ×3 IMPLANT
BLADE CLIPPER SURG (BLADE) ×3 IMPLANT
BUR ACORN 6.0 (BURR) ×2 IMPLANT
BUR ACORN 6.0MM (BURR) ×1
BUR MATCHSTICK NEURO 3.0 LAGG (BURR) ×3 IMPLANT
CANISTER SUCT 3000ML PPV (MISCELLANEOUS) ×3 IMPLANT
CAP REVERE LOCKING (Cap) ×6 IMPLANT
CLAMP SINGLE ROD TO ROD (Clamp) ×6 IMPLANT
CLOSURE WOUND 1/2 X4 (GAUZE/BANDAGES/DRESSINGS) ×1
CONT SPEC 4OZ CLIKSEAL STRL BL (MISCELLANEOUS) ×3 IMPLANT
COVER BACK TABLE 60X90IN (DRAPES) ×3 IMPLANT
DRAPE C-ARM 42X72 X-RAY (DRAPES) ×6 IMPLANT
DRAPE LAPAROTOMY 100X72X124 (DRAPES) ×3 IMPLANT
DRAPE POUCH INSTRU U-SHP 10X18 (DRAPES) ×3 IMPLANT
DRAPE PROXIMA HALF (DRAPES) ×6 IMPLANT
DRSG OPSITE POSTOP 4X8 (GAUZE/BANDAGES/DRESSINGS) ×3 IMPLANT
DRSG PAD ABDOMINAL 8X10 ST (GAUZE/BANDAGES/DRESSINGS) IMPLANT
DURAPREP 26ML APPLICATOR (WOUND CARE) ×3 IMPLANT
ELECT BLADE 4.0 EZ CLEAN MEGAD (MISCELLANEOUS) ×3
ELECT CAUTERY BLADE 6.4 (BLADE) ×3 IMPLANT
ELECT REM PT RETURN 9FT ADLT (ELECTROSURGICAL) ×3
ELECTRODE BLDE 4.0 EZ CLN MEGD (MISCELLANEOUS) ×1 IMPLANT
ELECTRODE REM PT RTRN 9FT ADLT (ELECTROSURGICAL) ×1 IMPLANT
EVACUATOR 1/8 PVC DRAIN (DRAIN) ×3 IMPLANT
GAUZE SPONGE 4X4 12PLY STRL (GAUZE/BANDAGES/DRESSINGS) ×3 IMPLANT
GAUZE SPONGE 4X4 16PLY XRAY LF (GAUZE/BANDAGES/DRESSINGS) ×3 IMPLANT
GLOVE BIO SURGEON STRL SZ8 (GLOVE) ×3 IMPLANT
GLOVE BIOGEL M 8.0 STRL (GLOVE) ×3 IMPLANT
GLOVE BIOGEL PI IND STRL 7.0 (GLOVE) ×1 IMPLANT
GLOVE BIOGEL PI IND STRL 8.5 (GLOVE) ×1 IMPLANT
GLOVE BIOGEL PI INDICATOR 7.0 (GLOVE) ×2
GLOVE BIOGEL PI INDICATOR 8.5 (GLOVE) ×2
GLOVE EXAM NITRILE LRG STRL (GLOVE) IMPLANT
GLOVE EXAM NITRILE MD LF STRL (GLOVE) IMPLANT
GLOVE EXAM NITRILE XL STR (GLOVE) IMPLANT
GLOVE EXAM NITRILE XS STR PU (GLOVE) IMPLANT
GLOVE SURG SS PI 6.5 STRL IVOR (GLOVE) ×6 IMPLANT
GOWN STRL REUS W/ TWL LRG LVL3 (GOWN DISPOSABLE) ×1 IMPLANT
GOWN STRL REUS W/ TWL XL LVL3 (GOWN DISPOSABLE) ×2 IMPLANT
GOWN STRL REUS W/TWL 2XL LVL3 (GOWN DISPOSABLE) IMPLANT
GOWN STRL REUS W/TWL LRG LVL3 (GOWN DISPOSABLE) ×2
GOWN STRL REUS W/TWL XL LVL3 (GOWN DISPOSABLE) ×4
KIT BASIN OR (CUSTOM PROCEDURE TRAY) ×3 IMPLANT
KIT INFUSE MEDIUM (Orthopedic Implant) ×3 IMPLANT
KIT ROOM TURNOVER OR (KITS) IMPLANT
NEEDLE HYPO 18GX1.5 BLUNT FILL (NEEDLE) ×3 IMPLANT
NEEDLE HYPO 21X1.5 SAFETY (NEEDLE) IMPLANT
NEEDLE HYPO 25X1 1.5 SAFETY (NEEDLE) IMPLANT
NS IRRIG 1000ML POUR BTL (IV SOLUTION) ×3 IMPLANT
PACK LAMINECTOMY NEURO (CUSTOM PROCEDURE TRAY) ×3 IMPLANT
PAD ARMBOARD 7.5X6 YLW CONV (MISCELLANEOUS) ×9 IMPLANT
PATTIES SURGICAL .5 X1 (DISPOSABLE) ×3 IMPLANT
PATTIES SURGICAL .5 X3 (DISPOSABLE) IMPLANT
ROD REVERE 7.5MM (Rod) ×3 IMPLANT
ROD REVERE CURVED 65MM (Rod) ×3 IMPLANT
SCREW REVERE 5.5X45 (Screw) ×6 IMPLANT
SPONGE LAP 4X18 X RAY DECT (DISPOSABLE) IMPLANT
SPONGE NEURO XRAY DETECT 1X3 (DISPOSABLE) IMPLANT
SPONGE SURGIFOAM ABS GEL 100 (HEMOSTASIS) ×3 IMPLANT
STAPLER VISISTAT 35W (STAPLE) ×3 IMPLANT
STRIP CLOSURE SKIN 1/2X4 (GAUZE/BANDAGES/DRESSINGS) ×2 IMPLANT
SUT VIC AB 1 CT1 18XBRD ANBCTR (SUTURE) ×2 IMPLANT
SUT VIC AB 1 CT1 8-18 (SUTURE) ×4
SUT VIC AB 2-0 CP2 18 (SUTURE) ×3 IMPLANT
SUT VIC AB 3-0 SH 8-18 (SUTURE) ×3 IMPLANT
SYR 20CC LL (SYRINGE) ×3 IMPLANT
SYR 5ML LL (SYRINGE) IMPLANT
TOWEL OR 17X24 6PK STRL BLUE (TOWEL DISPOSABLE) ×3 IMPLANT
TOWEL OR 17X26 10 PK STRL BLUE (TOWEL DISPOSABLE) ×3 IMPLANT
TRAY FOLEY CATH 16FRSI W/METER (SET/KITS/TRAYS/PACK) ×3 IMPLANT
TRAY FOLEY W/METER SILVER 14FR (SET/KITS/TRAYS/PACK) IMPLANT
WATER STERILE IRR 1000ML POUR (IV SOLUTION) ×3 IMPLANT

## 2015-07-24 NOTE — Anesthesia Procedure Notes (Signed)
Procedure Name: Intubation Date/Time: 07/24/2015 9:02 AM Performed by: Ollen Bowl Pre-anesthesia Checklist: Patient identified, Emergency Drugs available, Suction available, Patient being monitored and Timeout performed Patient Re-evaluated:Patient Re-evaluated prior to inductionOxygen Delivery Method: Circle system utilized and Simple face mask Preoxygenation: Pre-oxygenation with 100% oxygen Intubation Type: IV induction Ventilation: Mask ventilation without difficulty Laryngoscope Size: Miller and 3 Grade View: Grade I Tube type: Oral Tube size: 7.5 mm Number of attempts: 1 Airway Equipment and Method: Patient positioned with wedge pillow and Stylet Placement Confirmation: ETT inserted through vocal cords under direct vision,  positive ETCO2 and breath sounds checked- equal and bilateral Secured at: 23 cm Tube secured with: Tape Dental Injury: Teeth and Oropharynx as per pre-operative assessment

## 2015-07-24 NOTE — Op Note (Signed)
[name], [name] NO.:  [id number]  MEDICAL RECORD NO.:  [id number]  LOCATION:  [facility]                         FACILITY:  [facility]  PHYSICIAN:  Leeroy Cha, M.D.   DATE OF BIRTH:  02-12-1961  DATE OF PROCEDURE:  07/24/2015 DATE OF DISCHARGE:                              OPERATIVE REPORT   PREOPERATIVE DIAGNOSES:  Lumbar stenosis L1-L2 with neurogenic claudication.  Lumbar radiculopathy, status post fusion, 2-3 and 3-4.  POSTOPERATIVE DIAGNOSES:  Lumbar stenosis L1-L2 with neurogenic claudication.  Lumbar radiculopathy, status post fusion, 2-3 and 3-4.  PROCEDURE:  Bilateral L1 laminectomy and facetectomy, decompression of the thecal sac as well as the L1 and L2 nerve roots.  Augmentation of the fusion with addition of four L1 pedicle screws.  Posterolateral arthrodesis with BMP and autograft.  Cell Saver.  C-arm.  SURGEON:  Leeroy Cha, M.D.  ASSISTANT:  Marchia Meiers. Vertell Limber, M.D.  CLINICAL HISTORY:  [name] is a gentleman who many years ago underwent decompression and fusion at the level of lumbar 2, 3, and 4. The patient had been doing really well but lately he is having pain walking especially going up and down the steps with proximal weakness on both thighs.  X-ray showed severe stenosis at the level of 1-2.  Surgery was advised, and he knew the risks and benefits.  DESCRIPTION OF PROCEDURE:  The patient was taken to the OR, and after intubation, he was positioned in a prone manner.  The back was cleaned with DuraPrep and drapes were applied.  X-rays showed that we were right at the level of L1.  Then midline incision following the previous one was made from T12 to L1-L2 until we were able to see and feel the pedicle of L2.  We removed the spinous process of L1 and the lamina. The patient had quite a bit of adhesions and lysis was accomplished.  We did decompression of the thecal sac.  From then on, we proceeded with removal of the facet.  We  went laterally, more lysis of adhesion was accomplished, and at the end, we had plenty of space for the thecal sac and the nerve root.  Then, using the C-arm first in AP view and then on lateral view, we were able to made 2 holes in the pedicles.  Prior to introduction of the pedicle screws, we feel all 4 quadrants just to be sure we were surrounded by bone.  Then, 2 screws of 5.5 x 45 were inserted.  We cleaned the rod between L2-L3, and we were able to introduce 2 hooks.  Then, I got 2 of them from this hook all the way up to the pedicle of L1 were used to secure the fusion in place.  Then, we went laterally and we removed the lateral aspect of the facet of L2-L3, periosteum, and a mix of BMP and autograft was used for arthrodesis.  The area was irrigated.  Valsalva maneuver was negative.  Then, vancomycin powder was left in the operative site, and the wound was closed with Vicryl and staples.          ______________________________ Leeroy Cha, M.D.     EB/MEDQ  D:  07/24/2015  T:  07/24/2015  Job:  [id number]

## 2015-07-24 NOTE — Anesthesia Postprocedure Evaluation (Signed)
Anesthesia Post Note  Patient: [name]  Procedure(s) Performed: Procedure(s) (LRB): Lumbar one-two decompression and fusion (N/A)  Patient location during evaluation: PACU Anesthesia Type: General Level of consciousness: awake and alert Pain management: pain level controlled Vital Signs Assessment: post-procedure vital signs reviewed and stable Respiratory status: spontaneous breathing, nonlabored ventilation, respiratory function stable and patient connected to nasal cannula oxygen Cardiovascular status: blood pressure returned to baseline and stable Postop Assessment: no signs of nausea or vomiting Anesthetic complications: no    Last Vitals:  Filed Vitals:   07/24/15 1500 07/24/15 1635  BP: 127/93 161/80  Pulse: 65   Temp:  37.1 C  Resp:  14    Last Pain:  Filed Vitals:   07/24/15 1636  PainSc: 5                  Jachelle Fluty JENNETTE

## 2015-07-24 NOTE — Progress Notes (Signed)
Reg diet taken & tol well. Family at [facility] waiting on a bed

## 2015-07-24 NOTE — Anesthesia Preprocedure Evaluation (Addendum)
Anesthesia Evaluation  Patient identified by MRN, date of birth, ID band Patient awake    Reviewed: Allergy & Precautions, NPO status , Patient's Chart, lab work & pertinent test results  History of Anesthesia Complications Negative for: history of anesthetic complications  Airway Mallampati: II  TM Distance: >3 FB Neck ROM: Full    Dental no notable dental hx. (+) Dental Advisory Given, Teeth Intact   Pulmonary Current Smoker,    Pulmonary exam normal breath sounds clear to auscultation       Cardiovascular + Peripheral Vascular Disease  Normal cardiovascular exam Rhythm:Regular Rate:Normal     Neuro/Psych negative neurological ROS  negative psych ROS   GI/Hepatic negative GI ROS, Neg liver ROS,   Endo/Other  negative endocrine ROS  Renal/GU negative Renal ROS  negative genitourinary   Musculoskeletal  (+) Arthritis ,   Abdominal   Peds negative pediatric ROS (+)  Hematology negative hematology ROS (+)   Anesthesia Other Findings   Reproductive/Obstetrics negative OB ROS                            Anesthesia Physical Anesthesia Plan  ASA: II  Anesthesia Plan: General   Post-op Pain Management:    Induction: Intravenous  Airway Management Planned: Oral ETT  Additional Equipment:   Intra-op Plan:   Post-operative Plan: Extubation in OR  Informed Consent: I have reviewed the patients History and Physical, chart, labs and discussed the procedure including the risks, benefits and alternatives for the proposed anesthesia with the patient or authorized representative who has indicated his/her understanding and acceptance.   Dental advisory given  Plan Discussed with: CRNA  Anesthesia Plan Comments:         Anesthesia Quick Evaluation

## 2015-07-24 NOTE — Transfer of Care (Signed)
Immediate Anesthesia Transfer of Care Note  Patient: [name]  Procedure(s) Performed: Procedure(s): Lumbar one-two decompression and fusion (N/A)  Patient Location: PACU  Anesthesia Type:General  Level of Consciousness: awake and alert   Airway & Oxygen Therapy: Patient Spontanous Breathing and Patient connected to nasal cannula oxygen  Post-op Assessment: Report given to RN, Post -op Vital signs reviewed and stable and Patient moving all extremities X 4  Post vital signs: Reviewed and stable  Last Vitals:  Filed Vitals:   07/24/15 0648  BP: 119/71  Pulse: 54  Temp: 36.7 C  Resp: 20    Complications: No apparent anesthesia complications

## 2015-07-25 NOTE — Progress Notes (Signed)
Patient ID: [name], male   DOB: 08/01/1960, 55 y.o.   MRN: [id number] C/o incisional pain, no weakness. hemovac with some drainage. Wants to be off pca

## 2015-07-25 NOTE — Evaluation (Signed)
Physical Therapy Evaluation & Discharge Patient Details Name: [name] MRN: [id number] DOB: [birth year] Today's Date: 07/25/2015   History of Present Illness  Patient is a 55 y/o male admitted with Lumbar stenosis L1-L2 with neurogenic claudication. Lumbar radiculopathy, status post fusion, 2-3 and 3-4.  Now s/p L1 decompression and fusion.  Clinical Impression  Patient presents with limitations to mobility mainly due to IV/O2.  Verbalized understanding of all back precaution education without further skilled PT needs at this time as he is at mod I to supervision for mobility.  No further skilled acute PT needs at this time.    Follow Up Recommendations No PT follow up    Equipment Recommendations  None recommended by PT    Recommendations for Other Services       Precautions / Restrictions Precautions Precautions: Back Required Braces or Orthoses: Spinal Brace Spinal Brace: Lumbar corset;Applied in sitting position      Mobility  Bed Mobility Overal bed mobility: Modified Independent             General bed mobility comments: used log roll technique to come up to sit, but not to return to supine  Transfers Overall transfer level: Modified independent Equipment used: None             General transfer comment: stood prior to getting equipment ready, but not necessarily impulsive  Ambulation/Gait Ambulation/Gait assistance: Supervision Ambulation Distance (Feet): 200 Feet Assistive device: None Gait Pattern/deviations: Step-through pattern;Decreased stride length     General Gait Details: cues for precautions throughout due to twisting to speak to PT and student in the hall  Stairs Stairs: Yes Stairs assistance: Supervision Stair Management: One rail Right;Alternating pattern;Step to pattern;Forwards Number of Stairs: 3 General stair comments: assist for IV and safety; step through technique to ascend, step to to descend  Wheelchair Mobility     Modified Rankin (Stroke Patients Only)       Balance Overall balance assessment: Needs assistance   Sitting balance-Leahy Scale: Good     Standing balance support: No upper extremity supported Standing balance-Leahy Scale: Good Standing balance comment: static balance good, noted some sway with ambulation and occasional touching furniture/walls                             Pertinent Vitals/Pain Pain Assessment: 0-10 Pain Score: 7  Pain Location: lower back Pain Descriptors / Indicators: Sore;Aching Pain Intervention(s): Monitored during session;Repositioned;PCA encouraged    Home Living Family/patient expects to be discharged to:: Private residence Living Arrangements: Alone Available Help at Discharge: Family;Available PRN/intermittently Type of Home: House Home Access: Stairs to enter Entrance Stairs-Rails: Right Entrance Stairs-Number of Steps: 3 Home Layout: One level Home Equipment: Walker - 2 wheels;Cane - single point;Toilet riser      Prior Function Level of Independence: Independent               Hand Dominance        Extremity/Trunk Assessment   Upper Extremity Assessment: Overall WFL for tasks assessed           Lower Extremity Assessment: Overall WFL for tasks assessed         Communication   Communication: No difficulties  Cognition Arousal/Alertness: Awake/alert Behavior During Therapy: WFL for tasks assessed/performed;Anxious Overall Cognitive Status: Within Functional Limits for tasks assessed                      General Comments General  comments (skin integrity, edema, etc.): pt donned brace independent, reviewed precautions with handout and discussed both toileting and car transfers    Exercises        Assessment/Plan    PT Assessment Patent does not need any further PT services  PT Diagnosis Difficulty walking   PT Problem List    PT Treatment Interventions     PT Goals (Current goals can be  found in the Care Plan section) Acute Rehab PT Goals PT Goal Formulation: All assessment and education complete, DC therapy    Frequency     Barriers to discharge        Co-evaluation               End of Session Equipment Utilized During Treatment: Back brace Activity Tolerance: Patient tolerated treatment well Patient left: in bed;with call bell/phone within reach;with family/visitor present           Time: [id number] PT Time Calculation (min) (ACUTE ONLY): 29 min   Charges:   PT Evaluation $PT Eval Moderate Complexity: 1 Procedure PT Treatments $Gait Training: 8-22 mins   PT G CodesReginia Naas Aug 23, 2015, 11:30 AM  Magda Kiel, [place] Aug 23, 2015

## 2015-07-25 NOTE — Care Management Note (Signed)
Case Management Note  Patient Details  Name: [name] MRN: [id number] Date of Birth: 1960/05/14  Subjective/Objective:                    Action/Plan: Patient was admitted for a Lumbar one-two decompression and fusion.  Will follow for discharge needs pending PT/OT evals and physician orders.  Expected Discharge Date:                  Expected Discharge Plan:     In-House Referral:     Discharge planning Services     Post Acute Care Choice:    Choice offered to:     DME Arranged:    DME Agency:     HH Arranged:    HH Agency:     Status of Service:  In process, will continue to follow  Medicare Important Message Given:    Date Medicare IM Given:    Medicare IM give by:    Date Additional Medicare IM Given:    Additional Medicare Important Message give by:     If discussed at [place] of Stay Meetings, dates discussed:    Additional CommentsRolm Baptise, RN 07/25/2015, 9:24 AM [phone]

## 2015-07-25 NOTE — Evaluation (Signed)
Occupational Therapy Evaluation Patient Details Name: [name] MRN: [id number] DOB: [birth year] Today's Date: 07/25/2015    History of Present Illness Patient is a 55 y/o male admitted with Lumbar stenosis L1-L2 with neurogenic claudication. Lumbar radiculopathy, status post fusion, 2-3 and 3-4.  Now s/p L1 decompression and fusion.   Clinical Impression   Pt reports he was independent with ADLs PTA. Currently pt overall supervision for safety with ADLs and functional mobility. Pt able to verbally recall 3/3 back precautions and maintain throughout session. All back, safety, and ADL education complete. Pt planning to d/c home with intermittent supervision. No further acute OT needs identified; signing off at this time. Please re-consult if needs change. Thank you for this referral.    Follow Up Recommendations  No OT follow up;Supervision - Intermittent    Equipment Recommendations  Toilet rise with handles    Recommendations for Other Services       Precautions / Restrictions Precautions Precautions: Back Precaution Booklet Issued:  (Already in room) Precaution Comments: Pt able to verbally recall 3/3 back precautions at start of session and maintain throughout. Required Braces or Orthoses: Spinal Brace Spinal Brace: Lumbar corset;Applied in sitting position Restrictions Weight Bearing Restrictions: No      Mobility Bed Mobility Overal bed mobility: Modified Independent             General bed mobility comments: Pt able to demo log roll technique with HOB flat and no use of rails.  Transfers Overall transfer level: Modified independent Equipment used: None             General transfer comment: stood prior to getting equipment ready, but not necessarily impulsive    Balance Overall balance assessment: No apparent balance deficits (not formally assessed)   Sitting balance-Leahy Scale: Good     Standing balance support: No upper extremity  supported Standing balance-Leahy Scale: Good Standing balance comment: static balance good, noted some sway with ambulation and occasional touching furniture/walls                            ADL Overall ADL's : Needs assistance/impaired Eating/Feeding: Set up;Sitting   Grooming: Supervision/safety;Standing   Upper Body Bathing: Modified independent;Sitting   Lower Body Bathing: Supervison/ safety;Sit to/from stand   Upper Body Dressing : Sitting;Set up;Modified independent Upper Body Dressing Details (indicate cue type and reason): to don back brace Lower Body Dressing: Supervision/safety;Sit to/from stand Lower Body Dressing Details (indicate cue type and reason): Pt able to cross foot over opposite knee. Educated on compensatory strategies for LB ADLs. Toilet Transfer: Supervision/safety;Ambulation;Comfort height toilet   Toileting- Clothing Manipulation and Hygiene: Supervision/safety;Sit to/from stand       Functional mobility during ADLs: Supervision/safety General ADL Comments: Educated pt on maintaining back precautions during functional activities; pt verbalized understanding stating that he has done this multiple times before from previous back sx.     Vision     Perception     Praxis      Pertinent Vitals/Pain Pain Assessment: Faces Pain Score: 7  Faces Pain Scale: Hurts little more Pain Location: back Pain Descriptors / Indicators: Sore Pain Intervention(s): Limited activity within patient's tolerance;Monitored during session;Repositioned     Hand Dominance     Extremity/Trunk Assessment Upper Extremity Assessment Upper Extremity Assessment: Overall WFL for tasks assessed   Lower Extremity Assessment Lower Extremity Assessment: Defer to PT evaluation       Communication Communication Communication: No difficulties  Cognition Arousal/Alertness: Awake/alert Behavior During Therapy: WFL for tasks assessed/performed Overall Cognitive  Status: Within Functional Limits for tasks assessed                     General Comments       Exercises       Shoulder Instructions      Home Living Family/patient expects to be discharged to:: Private residence Living Arrangements: Alone Available Help at Discharge: Family;Available PRN/intermittently Type of Home: House Home Access: Stairs to enter [employer] of Steps: 3 Entrance Stairs-Rails: Right Home Layout: One level     Bathroom Shower/Tub: Tub only (garden tub)   Bathroom Toilet: Standard     Home Equipment: Environmental consultant - 2 wheels;Cane - single point;Shower seat          Prior Functioning/Environment Level of Independence: Independent             OT Diagnosis: Acute pain   OT Problem List:     OT Treatment/Interventions:      OT Goals(Current goals can be found in the care plan section) Acute Rehab OT Goals OT Goal Formulation: All assessment and education complete, DC therapy  OT Frequency:     Barriers to D/C:            Co-evaluation              End of Session Equipment Utilized During Treatment: Back brace  Activity Tolerance: Patient tolerated treatment well Patient left: Other (comment);with family/visitor present (sitting EOB)   Time: [id number] OT Time Calculation (min): 16 min Charges:  OT General Charges $OT Visit: 1 Procedure OT Evaluation $OT Eval Low Complexity: 1 Procedure G-Codes:     Binnie Kand M.S., OTR/L Pager: [phone]  07/25/2015, 1:26 PM

## 2015-07-26 NOTE — Discharge Summary (Signed)
Physician Discharge Summary  Patient ID: [name] MRN: [id number] DOB/AGE: 57-15-1962 57 y.o.  Admit date: 07/24/2015 Discharge date: 07/26/2015  Admission Diagnoses:lumbar stenosis  Discharge Diagnoses:  Active Problems:   Lumbar stenosis   Discharged Condition: no weakness  Hospital Course: surgery  Consults: none  Significant Diagnostic Studies: myelogram  Treatments: decompression and fusion  Discharge Exam: Blood pressure 110/44, pulse 42, temperature 99.5 F (37.5 C), temperature source Oral, resp. rate 18, height 6' (1.829 m), weight 82.101 kg (181 lb), SpO2 96 %. ambulating  Disposition: home     Medication List    ASK your doctor about these medications        alfuzosin 10 MG 24 hr tablet  Commonly known as:  UROXATRAL  Take 10 mg by mouth daily.     BAYER BACK & BODY 500-32.5 MG Tabs  Generic drug:  Aspirin-Caffeine  Take 1 tablet by mouth 2 (two) times daily as needed (for pain).     oxyCODONE 15 MG immediate release tablet  Commonly known as:  ROXICODONE  Take 15 mg by mouth every 6 (six) hours as needed for pain.         Signed: Floyce Stakes 07/26/2015, 9:24 AM

## 2015-07-26 NOTE — Progress Notes (Signed)
Patient ID: [name], male   DOB: 1960/06/13, 55 y.o.   MRN: [id number] Patient upset because he was getting 5 mg valium instead 10 BUT he was having periods of apnea with the pca and that was the idea of 5 to prevent any respiratory complication. He is to be dc today . To see me in 10 days

## 2015-07-26 NOTE — Progress Notes (Signed)
Pt for discharge home today. Discharge orders received. IV  dcd and staples clean dry and intact to lower back. Discharge instructions, handouts re: back precautions, and 2 prescriptions given with verbalized understanding. Family at bedside to assist with discharge. Staff brought patient to lobby via wheelchair at 1205. Transported to home by family member.

## 2015-07-26 NOTE — Progress Notes (Signed)
Patient ID: [name], male   DOB: [birth year], 55 y.o.   MRN: [id number] Came 3 times to see mr  Saunders Revel BUT he was having a meeting with a hospital administrator. Spoke with his sister. He can go home with the restrictions given yesterday and i will call thuis pm OR  He can wait till i finish my office at 5 pm

## 2015-08-31 ENCOUNTER — Ambulatory Visit (INDEPENDENT_AMBULATORY_CARE_PROVIDER_SITE_OTHER): Payer: Medicare Other | Admitting: Urology

## 2015-08-31 DIAGNOSIS — R3915 Urgency of urination: Secondary | ICD-10-CM

## 2015-08-31 DIAGNOSIS — R351 Nocturia: Secondary | ICD-10-CM | POA: Diagnosis not present

## 2016-05-22 ENCOUNTER — Encounter: Payer: Self-pay | Admitting: Family Medicine

## 2016-05-22 ENCOUNTER — Ambulatory Visit (INDEPENDENT_AMBULATORY_CARE_PROVIDER_SITE_OTHER): Payer: Medicare Other | Admitting: Family Medicine

## 2016-05-22 VITALS — BP 162/88 | HR 68 | Temp 97.7°F | Resp 18 | Ht 72.0 in | Wt 191.1 lb

## 2016-05-22 DIAGNOSIS — G8929 Other chronic pain: Secondary | ICD-10-CM

## 2016-05-22 DIAGNOSIS — Z720 Tobacco use: Secondary | ICD-10-CM | POA: Diagnosis not present

## 2016-05-22 DIAGNOSIS — N200 Calculus of kidney: Secondary | ICD-10-CM | POA: Diagnosis not present

## 2016-05-22 DIAGNOSIS — M5090 Cervical disc disorder, unspecified, unspecified cervical region: Secondary | ICD-10-CM | POA: Diagnosis not present

## 2016-05-22 DIAGNOSIS — Z2821 Immunization not carried out because of patient refusal: Secondary | ICD-10-CM | POA: Insufficient documentation

## 2016-05-22 DIAGNOSIS — Z8249 Family history of ischemic heart disease and other diseases of the circulatory system: Secondary | ICD-10-CM

## 2016-05-22 DIAGNOSIS — Z23 Encounter for immunization: Secondary | ICD-10-CM

## 2016-05-22 DIAGNOSIS — F1910 Other psychoactive substance abuse, uncomplicated: Secondary | ICD-10-CM | POA: Diagnosis not present

## 2016-05-22 DIAGNOSIS — Z77090 Contact with and (suspected) exposure to asbestos: Secondary | ICD-10-CM | POA: Diagnosis not present

## 2016-05-22 DIAGNOSIS — F609 Personality disorder, unspecified: Secondary | ICD-10-CM | POA: Diagnosis not present

## 2016-05-22 DIAGNOSIS — Z7689 Persons encountering health services in other specified circumstances: Secondary | ICD-10-CM | POA: Diagnosis not present

## 2016-05-22 DIAGNOSIS — N400 Enlarged prostate without lower urinary tract symptoms: Secondary | ICD-10-CM | POA: Insufficient documentation

## 2016-05-22 DIAGNOSIS — F17200 Nicotine dependence, unspecified, uncomplicated: Secondary | ICD-10-CM

## 2016-05-22 NOTE — Patient Instructions (Addendum)
Need old records Need lab work I have ordered a CT chest  Continue to eat well Try to walk every day that you are able  See me in a month  Need a Physical and EKG

## 2016-05-22 NOTE — Progress Notes (Addendum)
Chief Complaint  Patient presents with  . Establish Care   New to establish Limited old records History of multiple spine surgeries and long use of oxycodone.  States he quit 3 months ago and is going through withdrawal.  The [place] database shows he got a Rx for oxycodone 15 mg, #90 on 03/27/16.  He has a history of cocaine addiction many years ago. I have discussed the multiple health risks associated with cigarette smoking including, but not limited to, cardiovascular disease, lung disease and cancer.  I have strongly recommended that smoking be stopped.  I have reviewed the various methods of quitting including cold [place], classes, nicotine replacements and prescription medications.  I have offered assistance in this difficult process.  The patient is not interested in assistance at this time.  Very challenging to get history as patient talks continuously and I have trouble directing conversation - and he has difficulty answering questions. I suspect mental illness, likely personality disorder.  Denies any treatment for depression or anxiety in past.  He relates to going to multiple providers and names 6 physicians in town.  He is under care of a neurosurgeon for his spine.  He has had a colonoscopy at age 32 He has not had lung cancer screening He is not up to date with immunizations  - agrees to [place] - refuses prevnar and influenza No recent screening lab testing    Patient Active Problem List   Diagnosis Date Noted  . Cervical disc disease 05/22/2016  . Kidney stones 05/22/2016  . Tobacco abuse 05/22/2016  . BPH (benign prostatic hyperplasia) 05/22/2016  . H/O: asbestos exposure 05/22/2016  . Substance abuse 05/22/2016  . Personality disorder 05/22/2016  . Influenza vaccine refused 05/22/2016  . Lumbar stenosis 07/24/2015  . CAROTID ARTERY STENOSIS 04/24/2010  . PREMATURE VENTRICULAR CONTRACTIONS 02/25/2010    Outpatient Encounter Prescriptions as of 05/22/2016    Medication Sig  . alfuzosin (UROXATRAL) 10 MG 24 hr tablet Take 10 mg by mouth daily.  . Aspirin-Caffeine (BAYER BACK & BODY) 500-32.5 MG TABS Take 1 tablet by mouth 2 (two) times daily as needed (for pain).  . [DISCONTINUED] oxyCODONE (ROXICODONE) 15 MG immediate release tablet Take 15 mg by mouth every 6 (six) hours as needed for pain.    No facility-administered encounter medications on file as of 05/22/2016.     Past Medical History:  Diagnosis Date  . Allergy   . Arthritis    neck and back  . Blood transfusion without reported diagnosis   . BPH (benign prostatic hyperplasia)   . Cancer [facility]) 2004   testicle  . Chronic kidney disease    kidney stones  . Stones, urinary tract   . 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 BACK SURGERY     x3  . CERVICAL DISC SURGERY     x2  . testicular cancer  2004  . TONSILLECTOMY      Social History   Social History  . Marital status: Single    Spouse name: N/A  . Number of children: 1  . Years of education: 90   Occupational History  . retired     Psychologist, prison and probation services  . disabled    Social History Main Topics  . Smoking status: Current Every Day Smoker    Packs/day: 1.00    Years: 40.00    Types: Cigarettes    Start date: 03/31/1974  .  Smokeless tobacco: Never Used  . Alcohol use Yes  . Drug use: Unknown    Types: Cocaine     Comment: hx quit 87  . Sexual activity: Yes   Other Topics Concern  . Not on file   Social History Narrative   [employer] for 12 years   [employer] for 45 years      Never married   One daughter      Lives alone   Retail banker       Family History  Problem Relation Age of Onset  . COPD Mother   . Heart disease Mother   . Thyroid disease Sister   . Arthritis Sister   . Heart disease Sister     bypass    Review of Systems  Constitutional: Negative for malaise/fatigue and weight loss.  HENT: Negative for  congestion and hearing loss.        "jaw hurts when I chew"  Eyes: Negative for blurred vision and double vision.  Respiratory: Negative for sputum production and shortness of breath.   Cardiovascular: Negative for chest pain, palpitations and leg swelling.  Gastrointestinal: Negative for constipation, diarrhea and heartburn.       "hungry all the time"  Genitourinary: Negative for dysuria and urgency.  Musculoskeletal: Positive for back pain, myalgias and neck pain.       ? fibromyalgia  Neurological: Negative for seizures and headaches.  Psychiatric/Behavioral: Positive for substance abuse. Negative for depression. The patient has insomnia. The patient is not nervous/anxious.     BP (!) 162/88 (BP Location: Right Arm, Patient Position: Sitting, Cuff Size: Normal)   Pulse 68   Temp 97.7 F (36.5 C) (Temporal)   Resp 18   Ht 6' (1.829 m)   Wt 191 lb 1.9 oz (86.7 kg)   SpO2 99%   BMI 25.92 kg/m   Physical Exam  Constitutional: He is oriented to person, place, and time. He appears well-developed and well-nourished.  HENT:  Head: Normocephalic.  Mouth/Throat: Oropharynx is clear and moist.  Eyes: Conjunctivae are normal. Pupils are equal, round, and reactive to light.  Neck: Normal range of motion.  Cardiovascular: Normal rate and normal heart sounds.  Frequent extrasystoles are present.  Pulmonary/Chest: Effort normal and breath sounds normal.  Musculoskeletal: Normal range of motion. He exhibits no edema.  Lymphadenopathy:    He has no cervical adenopathy.  Neurological: He is alert and oriented to person, place, and time. He displays normal reflexes.  Psychiatric: His mood appears anxious. His speech is rapid and/or pressured. Thought content is paranoid. He expresses impulsivity.  Talks rapidly, interrupts.  Rambles.  Poor eye contact.  Tries to impress, brag.     ASSESSMENT/PLAN:  1. Cervical disc disease  2. Kidney stones  3. Tobacco abuse  4. Benign prostatic  hyperplasia without lower urinary tract symptoms  5. Has been smoking for 30 years  - CT CHEST LUNG CA SCREEN LOW DOSE W/O CM; Future  6. H/O: asbestos exposure   7. Encounter to establish care with new doctor  - Vitamin B12 - COMPLETE METABOLIC PANEL WITH GFR - CBC - Urinalysis, Routine w reflex microscopic - Lipid panel - VITAMIN D 25 Hydroxy (Vit-D Deficiency, Fractures) - Hepatitis C antibody - HIV antibody  8. Family history of heart disease in male family member before age 62  9. Other chronic pain - Ambulatory referral to Neurology  10. Personality disorder   11. Substance abuse   12.  Influenza vaccine refused  Greater than 50% of this visit was spent in counseling and coordinating care.  Total face to face time:  60 minutes.  Most of it in attempting to obtain pertinent history and scheduling appropriate testing/care.   Patient Instructions  Need old records Need lab work I have ordered a CT chest  Continue to eat well Try to walk every day that you are able  See me in a month  Need a Physical and EKG   Raylene Everts, MD

## 2016-05-28 ENCOUNTER — Ambulatory Visit (HOSPITAL_COMMUNITY)
Admission: RE | Admit: 2016-05-28 | Discharge: 2016-05-28 | Disposition: A | Discharge status: Home / Self Care | Payer: Medicare Other | Source: Ambulatory Visit | Attending: Family Medicine | Admitting: Family Medicine

## 2016-05-28 ENCOUNTER — Encounter: Payer: Self-pay | Admitting: Family Medicine

## 2016-05-28 DIAGNOSIS — F17200 Nicotine dependence, unspecified, uncomplicated: Secondary | ICD-10-CM | POA: Insufficient documentation

## 2016-05-28 DIAGNOSIS — M5090 Cervical disc disorder, unspecified, unspecified cervical region: Secondary | ICD-10-CM

## 2016-05-28 DIAGNOSIS — I700 Atherosclerosis of aorta: Secondary | ICD-10-CM | POA: Insufficient documentation

## 2016-05-28 DIAGNOSIS — Z77090 Contact with and (suspected) exposure to asbestos: Secondary | ICD-10-CM | POA: Insufficient documentation

## 2016-05-28 DIAGNOSIS — Z8249 Family history of ischemic heart disease and other diseases of the circulatory system: Secondary | ICD-10-CM | POA: Insufficient documentation

## 2016-05-28 DIAGNOSIS — Z7689 Persons encountering health services in other specified circumstances: Secondary | ICD-10-CM | POA: Diagnosis not present

## 2016-05-28 DIAGNOSIS — N200 Calculus of kidney: Secondary | ICD-10-CM

## 2016-05-28 DIAGNOSIS — J439 Emphysema, unspecified: Secondary | ICD-10-CM | POA: Insufficient documentation

## 2016-05-28 DIAGNOSIS — N400 Enlarged prostate without lower urinary tract symptoms: Secondary | ICD-10-CM

## 2016-05-28 DIAGNOSIS — Z720 Tobacco use: Secondary | ICD-10-CM

## 2016-05-28 DIAGNOSIS — J431 Panlobular emphysema: Secondary | ICD-10-CM | POA: Insufficient documentation

## 2016-05-28 HISTORY — DX: Panlobular emphysema: J43.1

## 2016-06-09 ENCOUNTER — Telehealth: Payer: Self-pay | Admitting: *Deleted

## 2016-06-09 ENCOUNTER — Ambulatory Visit: Payer: Medicare Other | Admitting: Neurology

## 2016-06-09 NOTE — Telephone Encounter (Signed)
Pt called to cancel due to poor weather conditions.

## 2016-06-12 ENCOUNTER — Encounter: Payer: Self-pay | Admitting: Neurology

## 2016-06-12 ENCOUNTER — Ambulatory Visit (INDEPENDENT_AMBULATORY_CARE_PROVIDER_SITE_OTHER): Payer: Medicare Other | Admitting: Neurology

## 2016-06-12 VITALS — BP 158/90 | HR 79 | Ht 72.0 in | Wt 190.0 lb

## 2016-06-12 DIAGNOSIS — M5416 Radiculopathy, lumbar region: Secondary | ICD-10-CM

## 2016-06-12 DIAGNOSIS — G8929 Other chronic pain: Secondary | ICD-10-CM | POA: Diagnosis not present

## 2016-06-12 DIAGNOSIS — M5442 Lumbago with sciatica, left side: Secondary | ICD-10-CM

## 2016-06-12 HISTORY — DX: Radiculopathy, lumbar region: M54.16

## 2016-06-12 MED ORDER — PREGABALIN 100 MG PO CAPS: 100.0000 mg | ORAL_CAPSULE | Freq: Three times a day (TID) | ORAL | 5 refills | Status: DC
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
## 2016-06-18 ENCOUNTER — Telehealth: Payer: Self-pay | Admitting: Neurology

## 2016-06-18 ENCOUNTER — Encounter: Payer: Self-pay | Admitting: *Deleted

## 2016-06-18 NOTE — Telephone Encounter (Signed)
He is no longer taking Lyrica and the swelling has resolved.  Dr. [name] is not going to start him on another new medication as this point.  His NCV/EMG has been moved to an earlier date.  He is agreeable to this plan.  Lyrica has been added to his allergy list.

## 2016-06-18 NOTE — Telephone Encounter (Signed)
Pt says he took 1 tab of lyrica on Friday pm. When he woke the next morning his bottom lip was swollen, rt eye and upper cheek was swollen, his eye looked runny. He has not taken any more.  Please call

## 2016-06-19 ENCOUNTER — Other Ambulatory Visit: Payer: Self-pay

## 2016-06-19 ENCOUNTER — Encounter: Payer: Self-pay | Admitting: Family Medicine

## 2016-06-19 ENCOUNTER — Ambulatory Visit (INDEPENDENT_AMBULATORY_CARE_PROVIDER_SITE_OTHER): Payer: Medicare Other | Admitting: Family Medicine

## 2016-06-19 VITALS — BP 134/78 | HR 76 | Temp 97.3°F | Resp 16 | Ht 72.0 in | Wt 194.1 lb

## 2016-06-19 DIAGNOSIS — I493 Ventricular premature depolarization: Secondary | ICD-10-CM | POA: Diagnosis not present

## 2016-06-19 DIAGNOSIS — Z0000 Encounter for general adult medical examination without abnormal findings: Secondary | ICD-10-CM

## 2016-06-19 DIAGNOSIS — I700 Atherosclerosis of aorta: Secondary | ICD-10-CM | POA: Diagnosis not present

## 2016-06-19 DIAGNOSIS — Z8719 Personal history of other diseases of the digestive system: Secondary | ICD-10-CM

## 2016-06-19 LAB — CBC
HCT: 41.8 % (ref 38.5–50.0)
Hemoglobin: 14.1 g/dL (ref 13.2–17.1)
MCH: 32.6 pg (ref 27.0–33.0)
MCHC: 33.7 g/dL (ref 32.0–36.0)
MCV: 96.5 fL (ref 80.0–100.0)
MPV: 10.7 fL (ref 7.5–12.5)
Platelets: 248 10*3/uL (ref 140–400)
RBC: 4.33 MIL/uL (ref 4.20–5.80)
RDW: 14.9 % (ref 11.0–15.0)
WBC: 7.2 10*3/uL (ref 3.8–10.8)

## 2016-06-19 LAB — LIPID PANEL
Cholesterol: 168 mg/dL (ref <200)
HDL: 54 mg/dL (ref >40)
LDL Cholesterol: 99 mg/dL (ref <100)
Total CHOL/HDL Ratio: 3.1 Ratio (ref <5.0)
Triglycerides: 73 mg/dL (ref <150)
VLDL: 15 mg/dL (ref <30)

## 2016-06-19 LAB — COMPLETE METABOLIC PANEL WITH GFR
ALT: 13 U/L (ref 9–46)
AST: 17 U/L (ref 10–35)
Albumin: 3.9 g/dL (ref 3.6–5.1)
Alkaline Phosphatase: 66 U/L (ref 40–115)
BUN: 13 mg/dL (ref 7–25)
CO2: 26 mmol/L (ref 20–31)
Calcium: 8.8 mg/dL (ref 8.6–10.3)
Chloride: 107 mmol/L (ref 98–110)
Creat: 1.16 mg/dL (ref 0.70–1.33)
GFR, Est African American: 80 mL/min (ref >=60)
GFR, Est Non African American: 70 mL/min (ref >=60)
Glucose, Bld: 55 mg/dL — ABNORMAL LOW (ref 65–99)
Potassium: 4 mmol/L (ref 3.5–5.3)
Sodium: 142 mmol/L (ref 135–146)
Total Bilirubin: 0.4 mg/dL (ref 0.2–1.2)
Total Protein: 6.8 g/dL (ref 6.1–8.1)

## 2016-06-19 LAB — VITAMIN B12: Vitamin B-12: 616 pg/mL (ref 200–1100)

## 2016-06-19 NOTE — Progress Notes (Signed)
Chief Complaint  Patient presents with  . Annual Exam   Patient is here for physical examination. He did go to a neurologist as recommended. He was tried on Lyrica. Unfortunately is allergic. He will go back for his nerve conduction studies and further evaluation. He states he continues to try to eat well and remain physically active. He has no new complaints. We discussed his recent CT scan of chest. He understands that he has emphysema and atherosclerosis. These are both good indications to quit smoking. He is mostly interested in whether he has any asbestos changes. I told him that these were not indicated in the report.  Patient Active Problem List   Diagnosis Date Noted  . Chronic left-sided low back pain with left-sided sciatica 06/12/2016  . Left lumbar radiculopathy 06/12/2016  . Aortic atherosclerosis ([place]) 05/28/2016  . Panlobular emphysema ([place]) 05/28/2016  . Cervical disc disease 05/22/2016  . Kidney stones 05/22/2016  . Tobacco abuse 05/22/2016  . BPH (benign prostatic hyperplasia) 05/22/2016  . H/O: asbestos exposure 05/22/2016  . Substance abuse 05/22/2016  . Personality disorder 05/22/2016  . Influenza vaccine refused 05/22/2016  . Lumbar stenosis 07/24/2015  . CAROTID ARTERY STENOSIS 04/24/2010  . PREMATURE VENTRICULAR CONTRACTIONS 02/25/2010    Outpatient Encounter Prescriptions as of 06/19/2016  Medication Sig  . alfuzosin (UROXATRAL) 10 MG 24 hr tablet Take 10 mg by mouth daily.  . Aspirin-Caffeine (BAYER BACK & BODY) 500-32.5 MG TABS Take 1 tablet by mouth 2 (two) times daily as needed (for pain).  . [DISCONTINUED] pregabalin (LYRICA) 100 MG capsule Take 1 capsule (100 mg total) by mouth 3 (three) times daily. (Patient not taking: Reported on 06/19/2016)   No facility-administered encounter medications on file as of 06/19/2016.     Allergies  Allergen Reactions  . Acetaminophen Nausea Only and Other (See Comments)    Elevated liver enzymes  . Imodium  [Loperamide] Swelling    facial  . Morphine Other (See Comments)    Bradycardia   . Aleve [Naproxen] Swelling    eye  . Diclofenac Sodium     Patient cannot remember reaction  . Lyrica [Pregabalin] Swelling  . Vioxx [Rofecoxib] Swelling    Review of Systems  Constitutional: Negative for activity change and appetite change.  HENT: Negative for congestion and dental problem.   Eyes: Negative for discharge and visual disturbance.       Wears reading glasses  Respiratory: Negative for chest tightness and shortness of breath.   Cardiovascular: Negative for chest pain and palpitations.       Unaware of irregular heartbeat  Gastrointestinal: Positive for blood in stool and constipation.       Blood with bowel movement, twice in the last week  Genitourinary: Negative for difficulty urinating and frequency.  Musculoskeletal: Positive for back pain, gait problem, neck pain and neck stiffness.  Skin: Negative for color change and wound.  Neurological: Positive for numbness. Negative for dizziness and headaches.  Hematological: Negative for adenopathy. Does not bruise/bleed easily.  Psychiatric/Behavioral: Negative for dysphoric mood and sleep disturbance. The patient is not nervous/anxious.     BP 134/78 (BP Location: Right Arm, Patient Position: Sitting, Cuff Size: Large)   Pulse 76   Temp 97.3 F (36.3 C) (Temporal)   Resp 16   Ht 6' (1.829 m)   Wt 194 lb 1.9 oz (88.1 kg)   SpO2 99%   BMI 26.33 kg/m   Physical Exam BP 134/78 (BP Location: Right Arm, Patient Position: Sitting,  Cuff Size: Large)   Pulse 76   Temp 97.3 F (36.3 C) (Temporal)   Resp 16   Ht 6' (1.829 m)   Wt 194 lb 1.9 oz (88.1 kg)   SpO2 99%   BMI 26.33 kg/m   General Appearance:    Alert, cooperative, no distress, appears stated age  Head:    Normocephalic, without obvious abnormality, atraumatic  Eyes:    PERRL, conjunctiva/corneas clear, EOM's intact,unable to see fundi - will not hold eyes still   ,  both eyes       Ears:    Normal TM's and external ear canals, both ears  Nose:   Nares normal, septum midline, mucosa normal, no drainage   or sinus tenderness  Throat:   Lips, mucosa, and tongue normal; teeth and gums normal  Neck:   Supple, symmetrical, trachea midline, no adenopathy;       thyroid:  No enlargement/tenderness/nodules; no carotid   bruit   Back:     Symmetric, no curvature, ROM limited, stiff.  Long lumbar scar  Lungs:     Clear to auscultation bilaterally, respirations unlabored  Chest wall:    No tenderness or deformity  Heart:    Regular rate and rhythm, S1 and S2 normal, no murmur, rub   or gallop, frequent ectopy  Abdomen:     Soft, non-tender, bowel sounds active all four quadrants,    no masses, no organomegaly  Extremities:   Extremities normal, atraumatic, no cyanosis or edema  Pulses:   2+ and symmetric all extremities  Skin:   Skin color, texture, turgor normal, no rashes or lesions  Lymph nodes:   Cervical, supraclavicular, and axillary nodes normal  Neurologic:   CNII-XII intact. Normal strength, sensation knee reflexes 1+ and 2+ L ,R    PSYCH :  Talkative, labile, difficult to direct, mimics another with loud aggressive tone that is somewhat alarming, inappropriate behavior - smiles    ASSESSMENT/PLAN:  1. Annual physical exam No abnormal findings  2. Atherosclerosis of aorta (HCC) Seen on CT - EKG 12-Lead  3. Asymptomatic PVCs unchanged on EKG  4. History of rectal bleeding  - Ambulatory referral to Gastroenterology   Patient Instructions  Continue to eat well Increase vegetables and fruits Exercise ever day that you are able Try to reduce - or quit smoking  Need lab testing today I will send you a letter with your test results.  If there is anything of concern, we will call right away.  I recommend you come back in the fall for flu and pneumonia shots I am referring you to a gastroenterologist for the rectal bleeding  Call for  problems    Raylene Everts, MD

## 2016-06-19 NOTE — Patient Instructions (Addendum)
Continue to eat well Increase vegetables and fruits Exercise ever day that you are able Try to reduce - or quit smoking  Need lab testing today I will send you a letter with your test results.  If there is anything of concern, we will call right away.  I recommend you come back in the fall for flu and pneumonia shots I am referring you to a gastroenterologist for the rectal bleeding  Call for problems

## 2016-06-20 ENCOUNTER — Encounter: Payer: Self-pay | Admitting: Family Medicine

## 2016-06-20 LAB — URINALYSIS, ROUTINE W REFLEX MICROSCOPIC
Bilirubin Urine: NEGATIVE
Glucose, UA: NEGATIVE
Hgb urine dipstick: NEGATIVE
Ketones, ur: NEGATIVE
Leukocytes, UA: NEGATIVE
Nitrite: NEGATIVE
Protein, ur: NEGATIVE
Specific Gravity, Urine: 1.015 (ref 1.001–1.035)
pH: 6.5 (ref 5.0–8.0)

## 2016-06-20 LAB — HEPATITIS C ANTIBODY: HCV Ab: NEGATIVE

## 2016-06-20 LAB — HIV ANTIBODY (ROUTINE TESTING W REFLEX): HIV 1&2 Ab, 4th Generation: NONREACTIVE

## 2016-06-20 LAB — VITAMIN D 25 HYDROXY (VIT D DEFICIENCY, FRACTURES): Vit D, 25-Hydroxy: 11 ng/mL — ABNORMAL LOW (ref 30–100)

## 2016-06-23 ENCOUNTER — Encounter: Payer: Self-pay | Admitting: Internal Medicine

## 2016-06-27 ENCOUNTER — Ambulatory Visit (INDEPENDENT_AMBULATORY_CARE_PROVIDER_SITE_OTHER): Payer: Medicare Other | Admitting: Neurology

## 2016-06-27 ENCOUNTER — Encounter (INDEPENDENT_AMBULATORY_CARE_PROVIDER_SITE_OTHER): Payer: Self-pay | Admitting: Neurology

## 2016-06-27 DIAGNOSIS — M5416 Radiculopathy, lumbar region: Secondary | ICD-10-CM | POA: Diagnosis not present

## 2016-06-27 DIAGNOSIS — G8929 Other chronic pain: Secondary | ICD-10-CM

## 2016-06-27 DIAGNOSIS — M5442 Lumbago with sciatica, left side: Secondary | ICD-10-CM

## 2016-06-27 DIAGNOSIS — Z0289 Encounter for other administrative examinations: Secondary | ICD-10-CM

## 2016-06-27 MED ORDER — DULOXETINE HCL 60 MG PO CPEP: 60.0000 mg | ORAL_CAPSULE | Freq: Every day | ORAL | 12 refills | Status: DC

## 2016-06-27 NOTE — Procedures (Signed)
Full Name: Jamian Andujo Gender: Male MRN #: [id number] Date of Birth: 1960-05-31    Visit Date: 06/27/2016 10:41 Age: 56 Years 11 Months Old Referring Physician: Dr Raylene Everts History: 56 years old male, with history of multiple lumbar decompression surgery continue complains of low back pain, radiating pain to bilateral lower extremity, frequent bilateral calf muscle spasm.  Summary of the test:  Nerve conduction study: Bilateral sural sensory responses were absent. Left peroneal to EDB motor response was absent. Right peroneal to EDB, bilateral tibial motor responses were normal.  Bilateral tibial H reflexes were absent.  Electromyography: Selective needle examination was performed at bilateral lower extremity muscles and bilateral lumbar sacral paraspinal muscles.  There was evidence of increased insertion activity and bilateral lumbar paraspinals along surgical incision, no evidence of spontaneous activity. There is also evidence of mild chronic neuropathic changes at bilateral lower extremity muscles.    Conclusion: This is an abnormal study. 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 There is also evidence of mild chronic bilateral lumbosacral radiculopathy. There is no evidence of active process.    ------------------------------- Marcial Pacas, M.D.  [facility] [place], [place] [address] Tel: [phone] Fax: [phone]        [facility]    Nerve / Sites Rec. Site Peak Lat Ref.  Amp Ref. Segments Distance    ms ms V V  cm  L Superficial peroneal - Ankle     Lat leg Ankle NR ?4.40 NR ?6 Lat leg - Ankle 14  R Superficial peroneal - Ankle     Lat leg Ankle NR ?4.40 NR ?6 Lat leg - Ankle 14            [facility]    Nerve / Sites Rec. Site Latency Ref. Amplitude Ref. Rel Amp Segments Distance Velocity Ref. Area    ms ms mV mV %  cm m/s m/s mVms  R Peroneal - EDB     Ankle EDB  4.6 ?6.5 4.9 ?2.0 100 Ankle - EDB 9   11.3     Fib head EDB 14.3  3.6  72.7 Fib head - Ankle 46 47 ?44 14.5     Pop fossa EDB 16.1  3.5  98.5 Pop fossa - Fib head 10 53 ?44 14.6         Pop fossa - Ankle      R Tibial - AH     Ankle AH 6.0 ?5.8 8.6 ?4.0 100 Ankle - AH 9   18.7     Pop fossa AH 17.3  1.6  18.2 Pop fossa - Ankle 48 42 ?41 5.7  L Tibial - AH     Ankle AH 6.4 ?5.8 7.0 ?4.0 100 Ankle - AH 9   18.6     Pop fossa AH 17.9  4.6  65.8 Pop fossa - Ankle 48 42 ?41 16.8  L Peroneal - EDB     Ankle EDB NR ?6.5 NR ?2.0 NR Ankle - EDB 9   NR     Fib head EDB NR  NR  NR Fib head - Ankle   ?44 NR     Pop fossa EDB NR  NR  NR Pop fossa - Fib head   ?44 NR         Pop fossa - Ankle                  F  Wave    Nerve F Lat Ref.   ms ms  R Tibial - AH 57.7 ?56.0  R Peroneal - EDB 59.6 ?56.0  L Tibial - AH 58.1 ?56.0  L Peroneal - EDB 59.0 ?56.0             H Reflex    Nerve H Lat Lat Hmax   ms ms   Both Ref. Both Ref.  Tibial - Soleus NR ?35.0 NR ?35.0       EMG       EMG Summary Table    Spontaneous MUAP Recruitment  Muscle IA Fib PSW Fasc Other Amp Dur. Poly Pattern  R. Tibialis anterior Normal None None None _______ Normal Increased Normal Reduced  R. Gastrocnemius (Medial head) Normal None None None _______ Normal Increased Normal Reduced  R. Vastus lateralis Normal None None None _______ Normal Normal Normal Reduced  L. Tibialis anterior Normal None None None _______ Increased Normal Normal Reduced  L. Gastrocnemius (Medial head) Normal None None None _______ Normal Normal Normal Reduced  L. Vastus lateralis Normal None None None _______ Normal Normal Normal Reduced  L. Lumbar paraspinals (mid) Increased None None None _______ Normal Normal Normal Normal  L. Lumbar paraspinals (up) Increased None None None _______ Normal Normal Normal Normal  R. Lumbar paraspinals (mid) Increased None None None _______ Normal Normal Normal Normal  R. Lumbar paraspinals (low) Increased  None None None _______ Normal Normal Normal Normal

## 2016-07-09 ENCOUNTER — Encounter: Payer: Self-pay | Admitting: Nurse Practitioner

## 2016-07-09 ENCOUNTER — Other Ambulatory Visit: Payer: Self-pay

## 2016-07-09 ENCOUNTER — Ambulatory Visit (INDEPENDENT_AMBULATORY_CARE_PROVIDER_SITE_OTHER): Payer: Medicare Other | Admitting: Nurse Practitioner

## 2016-07-09 DIAGNOSIS — K625 Hemorrhage of anus and rectum: Secondary | ICD-10-CM

## 2016-07-09 DIAGNOSIS — K649 Unspecified hemorrhoids: Secondary | ICD-10-CM | POA: Insufficient documentation

## 2016-07-09 MED ORDER — PEG 3350-KCL-NA BICARB-NACL 420 G PO SOLR: 4000.0000 mL | ORAL | 0 refills | Status: DC

## 2016-07-09 NOTE — Patient Instructions (Signed)
1. We will schedule your procedure for you. 2. Further recommendations to be made based on results of your procedure. 3. Return for follow-up based on postprocedure recommendations.

## 2016-07-09 NOTE — Assessment & Plan Note (Signed)
Admits history of hemorrhoids which she feels is the source of his bleeding. Manages them with Vaseline. Rare symptoms. Continue to monitor, return for follow-up based on postprocedure recommendations.

## 2016-07-09 NOTE — Progress Notes (Signed)
cc'ed to pcp °

## 2016-07-09 NOTE — Progress Notes (Signed)
Primary Care Physician:  Raylene Everts, MD Primary Gastroenterologist:  Dr. Gala Romney  Chief Complaint  Patient presents with  . Rectal Bleeding    bright red  . Abdominal Pain    mid upper abd    HPI:   [name] is a 56 y.o. male who presents with rectal bleeding and mid abdominal pain. Last saw primary care 06/19/2016 for annual exam. At that point it was noted he was having rectal bleeding and he was referred to GI for further evaluation. Also noted constipation on PCP exam. No colonoscopy records found in our system, no colonoscopy history found in past medical history.  Today he states he's doing ok. He noted rectal bleeding 3 weeks ago with every bowel movement, has a bowel movement Sunday (3 days ago) with no blood noted. Denies constipation. Denies N/V. Having some epigastric pain which he attributes to hunger pangs. Has some dark stools with iron. Denies unintentional weight loss, fever, chills. Last colonoscopy about 30 years ago for rectal bleeding and found to be hemorrhoids. He has noted some flare of hemorrhoids with first bloody stool. Denies chest pain, dyspnea, dizziness, lightheadedness, syncope, near syncope. Denies any other upper or lower GI symptoms.   Unknown family history related to colon cancer.  Past Medical History:  Diagnosis Date  . Allergy   . Arthritis    neck and back  . Blood transfusion without reported diagnosis   . BPH (benign prostatic hyperplasia)   . Cancer [facility]) 2004   testicle  . Chronic kidney disease    kidney stones  . Left lumbar radiculopathy 06/12/2016  . 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 Stones, urinary tract   . 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 BACK SURGERY     x5  . CERVICAL DISC SURGERY     x2  . testicular cancer  2004  . TONSILLECTOMY      Current Outpatient Prescriptions  Medication Sig Dispense Refill  . alfuzosin  (UROXATRAL) 10 MG 24 hr tablet Take 10 mg by mouth daily.  3  . Aspirin-Caffeine (BAYER BACK & BODY) 500-32.5 MG TABS Take 1 tablet by mouth 2 (two) times daily as needed (for pain).     No current facility-administered medications for this visit.     Allergies as of 07/09/2016 - Review Complete 07/09/2016  Allergen Reaction Noted  . Acetaminophen Nausea Only and Other (See Comments) 01/17/2015  . Imodium [loperamide] Swelling 01/17/2015  . Morphine Other (See Comments) 02/25/2010  . Aleve [naproxen] Swelling 07/18/2015  . Cymbalta [duloxetine hcl] Swelling 07/09/2016  . Diclofenac sodium  02/25/2010  . Lyrica [pregabalin] Swelling 06/18/2016  . Vioxx [rofecoxib] Swelling 07/18/2015    Family History  Problem Relation Age of Onset  . COPD Mother   . Heart disease Mother   . Other Father     Never knew his father.  . Thyroid disease Sister   . Arthritis Sister   . Heart disease Sister     bypass    Social History   Social History  . Marital status: Single    Spouse name: N/A  . Number of children: 1  . Years of education: 86   Occupational History  . retired     Psychologist, prison and probation services  . disabled    Social History Main Topics  . Smoking status: Current Every Day Smoker    Packs/day: 1.00    Years:  40.00    Types: Cigarettes    Start date: 03/31/1974  . Smokeless tobacco: Never Used  . Alcohol use No     Comment: Occasional; denied 07/09/16  . Drug use: Unknown    Types: Cocaine     Comment: hx quit 87  . Sexual activity: Yes   Other Topics Concern  . Not on file   Social History Narrative   [employer] for 12 years   [employer] for 54 years      Never married   One daughter      Lives alone   Retail banker   Right-handed   Occasional caffeine use             Review of Systems: General: Negative for anorexia, weight loss, fever, chills, fatigue, weakness. ENT: Negative for hoarseness, nasal congestion. CV: Negative for chest pain, angina,  palpitations, dyspnea on exertion, peripheral edema.  Respiratory: Negative for dyspnea at rest, cough, sputum, wheezing.  GI: See history of present illness. GU:  Negative for dysuria, hematuria, urinary incontinence, urinary frequency, nocturnal urination.  MS: Chronic back pain.  Derm: Negative for rash or itching.  Endo: Negative for unusual weight change.  Heme: Negative for bruising or bleeding.    Physical Exam: BP (!) 163/84   Pulse 77   Temp 97.9 F (36.6 C) (Oral)   Ht 6' (1.829 m)   Wt 193 lb 3.2 oz (87.6 kg)   BMI 26.20 kg/m  General:   Alert and oriented. Eccentric and seems somewhat mildly paranoid. Well-nourished and well-developed.  Head:  Normocephalic and atraumatic. Eyes:  Without icterus, sclera clear and conjunctiva pink.  Ears:  Normal auditory acuity. Cardiovascular:  S1, S2 present without murmurs appreciated. Extremities without clubbing or edema. Respiratory:  Clear to auscultation bilaterally. No wheezes, rales, or rhonchi. No distress.  Gastrointestinal:  +BS, soft, non-tender and non-distended. No HSM noted. No guarding or rebound. No masses appreciated.  Rectal:  Deferred  Musculoskalatal:  Symmetrical without gross deformities. Neurologic:  Alert and oriented x4;  grossly normal neurologically. Psych:  Alert and cooperative. Normal mood and affect. Heme/Lymph/Immune: No excessive bruising noted.    07/09/2016 9:33 AM   Disclaimer: This note was dictated with voice recognition software. Similar sounding words can inadvertently be transcribed and may not be corrected upon review.

## 2016-07-09 NOTE — Assessment & Plan Note (Signed)
The patient had about a 2 week course of rectal bleeding. Gross blood in the commode. Has a history of hemorrhoids and feels this is likely his etiology. Had a colonoscopy about 30 years ago and. He is currently age 56 and would be due for colonoscopy regardless, however given his rectal bleeding I recommended this and he is agreed. We'll proceed with colonoscopy on propofol.  Proceed with TCS with Dr. Gala Romney in near future: the risks, benefits, and alternatives have been discussed with the patient in detail. The patient states understanding and desires to proceed.  The patient was previously on Cymbalta and pain medication. History of chronic pain. He seems somewhat high anxiety an eccentric. No other anticoagulants, anxiolytics, chronic pain medications, or antidepressants. Given all these we will plan for the procedure on propofol/MAC

## 2016-07-18 ENCOUNTER — Encounter: Payer: Medicare Other | Admitting: Neurology

## 2016-08-01 NOTE — Patient Instructions (Signed)
Deveon Kisiel  08/01/2016     @PREFPERIOPPHARMACY @   Your procedure is scheduled on  08/11/2016   Report to [facility] at  1200  P.M.  Call this number if you have problems the morning of surgery:  [phone]   Remember:  Do not eat food or drink liquids after midnight.  Take these medicines the morning of surgery with A SIP OF WATER  uraxatrol.   Do not wear jewelry, make-up or nail polish.  Do not wear lotions, powders, or perfumes, or deoderant.  Do not shave 48 hours prior to surgery.  Men may shave face and neck.  Do not bring valuables to the hospital.  [facility] is not responsible for any belongings or valuables.  Contacts, dentures or bridgework may not be worn into surgery.  Leave your suitcase in the car.  After surgery it may be brought to your room.  For patients admitted to the hospital, discharge time will be determined by your treatment team.  Patients discharged the day of surgery will not be allowed to drive home.   Name and phone number of your driver:   family Special instructions:  Follow the diet and prep instructions given to you by Dr Roseanne Kaufman office.  Please read over the following fact sheets that you were given. Anesthesia Post-op Instructions and Care and Recovery After Surgery       Colonoscopy, Adult A colonoscopy is an exam to look at the entire large intestine. During the exam, a lubricated, bendable tube is inserted into the anus and then passed into the rectum, colon, and other parts of the large intestine. A colonoscopy is often done as a part of normal colorectal screening or in response to certain symptoms, such as anemia, persistent diarrhea, abdominal pain, and blood in the stool. The exam can help screen for and diagnose medical problems, including:  Tumors.  Polyps.  Inflammation.  Areas of bleeding. Tell a health care provider about:  Any allergies you have.  All medicines you are taking, including  vitamins, herbs, eye drops, creams, and over-the-counter medicines.  Any problems you or family members have had with anesthetic medicines.  Any blood disorders you have.  Any surgeries you have had.  Any medical conditions you have.  Any problems you have had passing stool. What are the risks? Generally, this is a safe procedure. However, problems may occur, including:  Bleeding.  A tear in the intestine.  A reaction to medicines given during the exam.  Infection (rare). What happens before the procedure? Eating and drinking restrictions  Follow instructions from your health care provider about eating and drinking, which may include:  A few days before the procedure - follow a low-fiber diet. Avoid nuts, seeds, dried fruit, raw fruits, and vegetables.  1-3 days before the procedure - follow a clear liquid diet. Drink only clear liquids, such as clear broth or bouillon, black coffee or tea, clear juice, clear soft drinks or sports drinks, gelatin dessert, and popsicles. Avoid any liquids that contain red or purple dye.  On the day of the procedure - do not eat or drink anything during the 2 hours before the procedure, or within the time period that your health care provider recommends. Bowel prep  If you were prescribed an oral bowel prep to clean out your colon:  Take it as told by your health care provider. Starting the day before your procedure, you will need to  drink a large amount of medicated liquid. The liquid will cause you to have multiple loose stools until your stool is almost clear or light green.  If your skin or anus gets irritated from diarrhea, you may use these to relieve the irritation:  Medicated wipes, such as adult wet wipes with aloe and vitamin E.  A skin soothing-product like petroleum jelly.  If you vomit while drinking the bowel prep, take a break for up to 60 minutes and then begin the bowel prep again. If vomiting continues and you cannot take the  bowel prep without vomiting, call your health care provider. General instructions   Ask your health care provider about changing or stopping your regular medicines. This is especially important if you are taking diabetes medicines or blood thinners.  Plan to have someone take you home from the hospital or clinic. What happens during the procedure?  An IV tube may be inserted into one of your veins.  You will be given medicine to help you relax (sedative).  To reduce your risk of infection:  Your health care team will wash or sanitize their hands.  Your anal area will be washed with soap.  You will be asked to lie on your side with your knees bent.  Your health care provider will lubricate a long, thin, flexible tube. The tube will have a camera and a light on the end.  The tube will be inserted into your anus.  The tube will be gently eased through your rectum and colon.  Air will be delivered into your colon to keep it open. You may feel some pressure or cramping.  The camera will be used to take images during the procedure.  A small tissue sample may be removed from your body to be examined under a microscope (biopsy). If any potential problems are found, the tissue will be sent to a lab for testing.  If small polyps are found, your health care provider may remove them and have them checked for cancer cells.  The tube that was inserted into your anus will be slowly removed. The procedure may vary among health care providers and hospitals. What happens after the procedure?  Your blood pressure, heart rate, breathing rate, and blood oxygen level will be monitored until the medicines you were given have worn off.  Do not drive for 24 hours after the exam.  You may have a small amount of blood in your stool.  You may pass gas and have mild abdominal cramping or bloating due to the air that was used to inflate your colon during the exam.  It is up to you to get the results  of your procedure. Ask your health care provider, or the department performing the procedure, when your results will be ready. This information is not intended to replace advice given to you by your health care provider. Make sure you discuss any questions you have with your health care provider. Document Released: 03/14/2000 Document Revised: 01/16/2016 Document Reviewed: 05/29/2015 Elsevier Interactive Patient Education  2017 [employer]  Colonoscopy, Adult, Care After This sheet gives you information about how to care for yourself after your procedure. Your health care provider may also give you more specific instructions. If you have problems or questions, contact your health care provider. What can I expect after the procedure? After the procedure, it is common to have:  A small amount of blood in your stool for 24 hours after the procedure.  Some gas.  Mild  abdominal cramping or bloating. Follow these instructions at home: General instructions    For the first 24 hours after the procedure:  Do not drive or use machinery.  Do not sign important documents.  Do not drink alcohol.  Do your regular daily activities at a slower pace than normal.  Eat soft, easy-to-digest foods.  Rest often.  Take over-the-counter or prescription medicines only as told by your health care provider.  It is up to you to get the results of your procedure. Ask your health care provider, or the department performing the procedure, when your results will be ready. Relieving cramping and bloating   Try walking around when you have cramps or feel bloated.  Apply heat to your abdomen as told by your health care provider. Use a heat source that your health care provider recommends, such as a moist heat pack or a heating pad.  Place a towel between your skin and the heat source.  Leave the heat on for 20-30 minutes.  Remove the heat if your skin turns bright red. This is especially important if  you are unable to feel pain, heat, or cold. You may have a greater risk of getting burned. Eating and drinking   Drink enough fluid to keep your urine clear or pale yellow.  Resume your normal diet as instructed by your health care provider. Avoid heavy or fried foods that are hard to digest.  Avoid drinking alcohol for as long as instructed by your health care provider. Contact a health care provider if:  You have blood in your stool 2-3 days after the procedure. Get help right away if:  You have more than a small spotting of blood in your stool.  You pass large blood clots in your stool.  Your abdomen is swollen.  You have nausea or vomiting.  You have a fever.  You have increasing abdominal pain that is not relieved with medicine. This information is not intended to replace advice given to you by your health care provider. Make sure you discuss any questions you have with your health care provider. Document Released: 10/30/2003 Document Revised: 12/10/2015 Document Reviewed: 05/29/2015 Elsevier Interactive Patient Education  2017 [place] Anesthesia is a term that refers to techniques, procedures, and medicines that help a person stay safe and comfortable during a medical procedure. Monitored anesthesia care, or sedation, is one type of anesthesia. Your anesthesia specialist may recommend sedation if you will be having a procedure that does not require you to be unconscious, such as:  Cataract surgery.  A dental procedure.  A biopsy.  A colonoscopy. During the procedure, you may receive a medicine to help you relax (sedative). There are three levels of sedation:  Mild sedation. At this level, you may feel awake and relaxed. You will be able to follow directions.  Moderate sedation. At this level, you will be sleepy. You may not remember the procedure.  Deep sedation. At this level, you will be asleep. You will not remember the  procedure. The more medicine you are given, the deeper your level of sedation will be. Depending on how you respond to the procedure, the anesthesia specialist may change your level of sedation or the type of anesthesia to fit your needs. An anesthesia specialist will monitor you closely during the procedure. Let your health care provider know about:  Any allergies you have.  All medicines you are taking, including vitamins, herbs, eye drops, creams, and over-the-counter medicines.  Any  use of steroids (by mouth or as a cream).  Any problems you or family members have had with sedatives and anesthetic medicines.  Any blood disorders you have.  Any surgeries you have had.  Any medical conditions you have, such as sleep apnea.  Whether you are pregnant or may be pregnant.  Any use of cigarettes, alcohol, or street drugs. What are the risks? Generally, this is a safe procedure. However, problems may occur, including:  Getting too much medicine (oversedation).  Nausea.  Allergic reaction to medicines.  Trouble breathing. If this happens, a breathing tube may be used to help with breathing. It will be removed when you are awake and breathing on your own.  Heart trouble.  Lung trouble. Before the procedure Staying hydrated  Follow instructions from your health care provider about hydration, which may include:  Up to 2 hours before the procedure - you may continue to drink clear liquids, such as water, clear fruit juice, black coffee, and plain tea. Eating and drinking restrictions  Follow instructions from your health care provider about eating and drinking, which may include:  8 hours before the procedure - stop eating heavy meals or foods such as meat, fried foods, or fatty foods.  6 hours before the procedure - stop eating light meals or foods, such as toast or cereal.  6 hours before the procedure - stop drinking milk or drinks that contain milk.  2 hours before the  procedure - stop drinking clear liquids. Medicines  Ask your health care provider about:  Changing or stopping your regular medicines. This is especially important if you are taking diabetes medicines or blood thinners.  Taking medicines such as aspirin and ibuprofen. These medicines can thin your blood. Do not take these medicines before your procedure if your health care provider instructs you not to. Tests and exams  You will have a physical exam.  You may have blood tests done to show:  How well your kidneys and liver are working.  How well your blood can clot.  General instructions  Plan to have someone take you home from the hospital or clinic.  If you will be going home right after the procedure, plan to have someone with you for 24 hours. What happens during the procedure?  Your blood pressure, heart rate, breathing, level of pain and overall condition will be monitored.  An IV tube will be inserted into one of your veins.  Your anesthesia specialist will give you medicines as needed to keep you comfortable during the procedure. This may mean changing the level of sedation.  The procedure will be performed. After the procedure  Your blood pressure, heart rate, breathing rate, and blood oxygen level will be monitored until the medicines you were given have worn off.  Do not drive for 24 hours if you received a sedative.  You may:  Feel sleepy, clumsy, or nauseous.  Feel forgetful about what happened after the procedure.  Have a sore throat if you had a breathing tube during the procedure.  Vomit. This information is not intended to replace advice given to you by your health care provider. Make sure you discuss any questions you have with your health care provider. Document Released: 12/11/2004 Document Revised: 08/24/2015 Document Reviewed: 07/08/2015 Elsevier Interactive Patient Education  2017 [place], Care After These  instructions provide you with information about caring for yourself after your procedure. Your health care provider may also give you more specific instructions. Your  treatment has been planned according to current medical practices, but problems sometimes occur. Call your health care provider if you have any problems or questions after your procedure. What can I expect after the procedure? After your procedure, it is common to:  Feel sleepy for several hours.  Feel clumsy and have poor balance for several hours.  Feel forgetful about what happened after the procedure.  Have poor judgment for several hours.  Feel nauseous or vomit.  Have a sore throat if you had a breathing tube during the procedure. Follow these instructions at home: For at least 24 hours after the procedure:    Do not:  Participate in activities in which you could fall or become injured.  Drive.  Use heavy machinery.  Drink alcohol.  Take sleeping pills or medicines that cause drowsiness.  Make important decisions or sign legal documents.  Take care of children on your own.  Rest. Eating and drinking   Follow the diet that is recommended by your health care provider.  If you vomit, drink water, juice, or soup when you can drink without vomiting.  Make sure you have little or no nausea before eating solid foods. General instructions   Have a responsible adult stay with you until you are awake and alert.  Take over-the-counter and prescription medicines only as told by your health care provider.  If you smoke, do not smoke without supervision.  Keep all follow-up visits as told by your health care provider. This is important. Contact a health care provider if:  You keep feeling nauseous or you keep vomiting.  You feel light-headed.  You develop a rash.  You have a fever. Get help right away if:  You have trouble breathing. This information is not intended to replace advice given to you  by your health care provider. Make sure you discuss any questions you have with your health care provider. Document Released: 07/08/2015 Document Revised: 11/07/2015 Document Reviewed: 07/08/2015 Elsevier Interactive Patient Education  2017 [employer].

## 2016-08-05 ENCOUNTER — Encounter (HOSPITAL_COMMUNITY): Payer: Self-pay

## 2016-08-05 ENCOUNTER — Encounter (HOSPITAL_COMMUNITY)
Admission: RE | Admit: 2016-08-05 | Discharge: 2016-08-05 | Disposition: A | Discharge status: Home / Self Care | Payer: Medicare Other | Source: Ambulatory Visit | Attending: Internal Medicine | Admitting: Internal Medicine

## 2016-08-05 DIAGNOSIS — K625 Hemorrhage of anus and rectum: Secondary | ICD-10-CM | POA: Diagnosis not present

## 2016-08-05 DIAGNOSIS — Z01812 Encounter for preprocedural laboratory examination: Secondary | ICD-10-CM | POA: Diagnosis not present

## 2016-08-05 HISTORY — DX: Other specified health status: Z78.9

## 2016-08-05 LAB — CBC WITH DIFFERENTIAL/PLATELET
Basophils Absolute: 0 10*3/uL (ref 0.0–0.1)
Basophils Relative: 0 %
Eosinophils Absolute: 0.4 10*3/uL (ref 0.0–0.7)
Eosinophils Relative: 5 %
HCT: 42.4 % (ref 39.0–52.0)
Hemoglobin: 14.4 g/dL (ref 13.0–17.0)
Lymphocytes Relative: 46 %
Lymphs Abs: 3.5 10*3/uL (ref 0.7–4.0)
MCH: 33.4 pg (ref 26.0–34.0)
MCHC: 34 g/dL (ref 30.0–36.0)
MCV: 98.4 fL (ref 78.0–100.0)
Monocytes Absolute: 0.6 10*3/uL (ref 0.1–1.0)
Monocytes Relative: 7 %
Neutro Abs: 3.2 10*3/uL (ref 1.7–7.7)
Neutrophils Relative %: 42 %
Platelets: 243 10*3/uL (ref 150–400)
RBC: 4.31 MIL/uL (ref 4.22–5.81)
RDW: 13.8 % (ref 11.5–15.5)
WBC: 7.7 10*3/uL (ref 4.0–10.5)

## 2016-08-05 LAB — BASIC METABOLIC PANEL
Anion gap: 9 (ref 5–15)
BUN: 14 mg/dL (ref 6–20)
CO2: 24 mmol/L (ref 22–32)
Calcium: 9.1 mg/dL (ref 8.9–10.3)
Chloride: 108 mmol/L (ref 101–111)
Creatinine, Ser: 1.16 mg/dL (ref 0.61–1.24)
GFR calc Af Amer: >60 mL/min (ref >60)
GFR calc non Af Amer: >60 mL/min (ref >60)
Glucose, Bld: 78 mg/dL (ref 65–99)
Potassium: 4.2 mmol/L (ref 3.5–5.1)
Sodium: 141 mmol/L (ref 135–145)

## 2016-08-05 LAB — RAPID URINE DRUG SCREEN, HOSP PERFORMED
Amphetamines: NOT DETECTED
Barbiturates: NOT DETECTED
Benzodiazepines: NOT DETECTED
Cocaine: NOT DETECTED
Opiates: NOT DETECTED
Tetrahydrocannabinol: POSITIVE — AB

## 2016-08-08 ENCOUNTER — Encounter (HOSPITAL_COMMUNITY): Payer: Self-pay

## 2016-08-11 ENCOUNTER — Encounter (HOSPITAL_COMMUNITY)
Admission: RE | Disposition: A | Discharge status: Home / Self Care | Payer: Self-pay | Source: Ambulatory Visit | Attending: Internal Medicine

## 2016-08-11 ENCOUNTER — Ambulatory Visit (HOSPITAL_COMMUNITY)
Admission: RE | Admit: 2016-08-11 | Discharge: 2016-08-11 | Disposition: A | Discharge status: Home / Self Care | Payer: Medicare Other | Source: Ambulatory Visit | Attending: Internal Medicine | Admitting: Internal Medicine

## 2016-08-11 ENCOUNTER — Ambulatory Visit (HOSPITAL_COMMUNITY): Payer: Medicare Other | Admitting: Anesthesiology

## 2016-08-11 ENCOUNTER — Encounter (HOSPITAL_COMMUNITY): Payer: Self-pay | Admitting: *Deleted

## 2016-08-11 DIAGNOSIS — J449 Chronic obstructive pulmonary disease, unspecified: Secondary | ICD-10-CM | POA: Insufficient documentation

## 2016-08-11 DIAGNOSIS — Z7982 Long term (current) use of aspirin: Secondary | ICD-10-CM | POA: Diagnosis not present

## 2016-08-11 DIAGNOSIS — K648 Other hemorrhoids: Secondary | ICD-10-CM | POA: Insufficient documentation

## 2016-08-11 DIAGNOSIS — K5730 Diverticulosis of large intestine without perforation or abscess without bleeding: Secondary | ICD-10-CM | POA: Insufficient documentation

## 2016-08-11 DIAGNOSIS — K649 Unspecified hemorrhoids: Secondary | ICD-10-CM

## 2016-08-11 DIAGNOSIS — Z87442 Personal history of urinary calculi: Secondary | ICD-10-CM | POA: Diagnosis not present

## 2016-08-11 DIAGNOSIS — N189 Chronic kidney disease, unspecified: Secondary | ICD-10-CM | POA: Insufficient documentation

## 2016-08-11 DIAGNOSIS — K621 Rectal polyp: Secondary | ICD-10-CM | POA: Diagnosis not present

## 2016-08-11 DIAGNOSIS — F17210 Nicotine dependence, cigarettes, uncomplicated: Secondary | ICD-10-CM | POA: Insufficient documentation

## 2016-08-11 DIAGNOSIS — K921 Melena: Secondary | ICD-10-CM

## 2016-08-11 DIAGNOSIS — M5416 Radiculopathy, lumbar region: Secondary | ICD-10-CM | POA: Diagnosis not present

## 2016-08-11 DIAGNOSIS — K625 Hemorrhage of anus and rectum: Secondary | ICD-10-CM

## 2016-08-11 DIAGNOSIS — N400 Enlarged prostate without lower urinary tract symptoms: Secondary | ICD-10-CM | POA: Diagnosis not present

## 2016-08-11 HISTORY — PX: COLONOSCOPY WITH PROPOFOL: SHX5780

## 2016-08-11 HISTORY — PX: POLYPECTOMY: SHX5525

## 2016-08-11 SURGERY — COLONOSCOPY WITH PROPOFOL
Anesthesia: Monitor Anesthesia Care

## 2016-08-11 MED ORDER — FENTANYL CITRATE (PF) 100 MCG/2ML IJ SOLN
INTRAMUSCULAR | Status: AC
  Filled 2016-08-11: qty 2

## 2016-08-11 MED ORDER — PROPOFOL 10 MG/ML IV BOLUS
INTRAVENOUS | Status: AC
  Filled 2016-08-11: qty 20

## 2016-08-11 MED ORDER — LIDOCAINE HCL (CARDIAC) 10 MG/ML IV SOLN
INTRAVENOUS | Status: DC | PRN
  Administered 2016-08-11: 50 mg via INTRAVENOUS

## 2016-08-11 MED ORDER — MIDAZOLAM HCL 2 MG/2ML IJ SOLN
1.0000 mg | INTRAMUSCULAR | Status: DC | PRN
  Administered 2016-08-11: 2 mg via INTRAVENOUS

## 2016-08-11 MED ORDER — PROPOFOL 10 MG/ML IV BOLUS
INTRAVENOUS | Status: DC | PRN
  Administered 2016-08-11 (×3): 20 mg via INTRAVENOUS

## 2016-08-11 MED ORDER — CHLORHEXIDINE GLUCONATE CLOTH 2 % EX PADS: 6.0000 | MEDICATED_PAD | Freq: Once | CUTANEOUS | Status: DC

## 2016-08-11 MED ORDER — PROPOFOL 500 MG/50ML IV EMUL
INTRAVENOUS | Status: DC | PRN
  Administered 2016-08-11: 75 ug/kg/min via INTRAVENOUS
  Administered 2016-08-11: 150 ug/kg/min via INTRAVENOUS

## 2016-08-11 MED ORDER — MIDAZOLAM HCL 2 MG/2ML IJ SOLN
INTRAMUSCULAR | Status: AC
  Filled 2016-08-11: qty 2

## 2016-08-11 MED ORDER — LACTATED RINGERS IV SOLN
INTRAVENOUS | Status: DC
  Administered 2016-08-11: 13:00:00 via INTRAVENOUS

## 2016-08-11 MED ORDER — LIDOCAINE HCL (PF) 1 % IJ SOLN
INTRAMUSCULAR | Status: AC
  Filled 2016-08-11: qty 5

## 2016-08-11 NOTE — Discharge Instructions (Addendum)
Colonoscopy Discharge Instructions  Read the instructions outlined below and refer to this sheet in the next few weeks. These discharge instructions provide you with general information on caring for yourself after you leave the hospital. Your doctor may also give you specific instructions. While your treatment has been planned according to the most current medical practices available, unavoidable complications occasionally occur. If you have any problems or questions after discharge, call Dr. Gala Romney at [phone]. ACTIVITY  You may resume your regular activity, but move at a slower pace for the next 24 hours.   Take frequent rest periods for the next 24 hours.   Walking will help get rid of the air and reduce the bloated feeling in your belly (abdomen).   No driving for 24 hours (because of the medicine (anesthesia) used during the test).    Do not sign any important legal documents or operate any machinery for 24 hours (because of the anesthesia used during the test).  NUTRITION  Drink plenty of fluids.   You may resume your normal diet as instructed by your doctor.   Begin with a light meal and progress to your normal diet. Heavy or fried foods are harder to digest and may make you feel sick to your stomach (nauseated).   Avoid alcoholic beverages for 24 hours or as instructed.  MEDICATIONS  You may resume your normal medications unless your doctor tells you otherwise.  WHAT YOU CAN EXPECT TODAY  Some feelings of bloating in the abdomen.   Passage of more gas than usual.   Spotting of blood in your stool or on the toilet paper.  IF YOU HAD POLYPS REMOVED DURING THE COLONOSCOPY:  No aspirin products for 7 days or as instructed.   No alcohol for 7 days or as instructed.   Eat a soft diet for the next 24 hours.  FINDING OUT THE RESULTS OF YOUR TEST Not all test results are available during your visit. If your test results are not back during the visit, make an appointment  with your caregiver to find out the results. Do not assume everything is normal if you have not heard from your caregiver or the medical facility. It is important for you to follow up on all of your test results.  SEEK IMMEDIATE MEDICAL ATTENTION IF:  You have more than a spotting of blood in your stool.   Your belly is swollen (abdominal distention).   You are nauseated or vomiting.   You have a temperature over 101.   You have abdominal pain or discomfort that is severe or gets worse throughout the day.    Colon Polyps Polyps are tissue growths inside the body. Polyps can grow in many places, including the large intestine (colon). A polyp may be a round bump or a mushroom-shaped growth. You could have one polyp or several. Most colon polyps are noncancerous (benign). However, some colon polyps can become cancerous over time. What are the causes? The exact cause of colon polyps is not known. What increases the risk? This condition is more likely to develop in people who:  Have a family history of colon cancer or colon polyps.  Are older than 20 or older than 45 if they are African American.  Have inflammatory bowel disease, such as ulcerative colitis or Crohn disease.  Are overweight.  Smoke cigarettes.  Do not get enough exercise.  Drink too much alcohol.  Eat a diet that is:  High in fat and red meat.  Low in  fiber.  Had childhood cancer that was treated with abdominal radiation. What are the signs or symptoms? Most polyps do not cause symptoms. If you have symptoms, they may include:  Blood coming from your rectum when having a bowel movement.  Blood in your stool.The stool may look dark red or black.  A change in bowel habits, such as constipation or diarrhea. How is this diagnosed? This condition is diagnosed with a colonoscopy. This is a procedure that uses a lighted, flexible scope to look at the inside of your colon. How is this treated? Treatment for  this condition involves removing any polyps that are found. Those polyps will then be tested for cancer. If cancer is found, your health care provider will talk to you about options for colon cancer treatment. Follow these instructions at home: Diet   Eat plenty of fiber, such as fruits, vegetables, and whole grains.  Eat foods that are high in calcium and vitamin D, such as milk, cheese, yogurt, eggs, liver, fish, and broccoli.  Limit foods high in fat, red meats, and processed meats, such as hot dogs, sausage, bacon, and lunch meats.  Maintain a healthy weight, or lose weight if recommended by your health care provider. General instructions   Do not smoke cigarettes.  Do not drink alcohol excessively.  Keep all follow-up visits as told by your health care provider. This is important. This includes keeping regularly scheduled colonoscopies. Talk to your health care provider about when you need a colonoscopy.  Exercise every day or as told by your health care provider. Contact a health care provider if:  You have new or worsening bleeding during a bowel movement.  You have new or increased blood in your stool.  You have a change in bowel habits.  You unexpectedly lose weight. This information is not intended to replace advice given to you by your health care provider. Make sure you discuss any questions you have with your health care provider. Document Released: 12/12/2003 Document Revised: 08/23/2015 Document Reviewed: 02/05/2015 Elsevier Interactive Patient Education  2017 [employer]  Diverticulosis Diverticulosis is a condition that develops when small pouches (diverticula) form in the wall of the large intestine (colon). The colon is where water is absorbed and stool is formed. The pouches form when the inside layer of the colon pushes through weak spots in the outer layers of the colon. You may have a few pouches or many of them. What are the causes? The cause of this  condition is not known. What increases the risk? The following factors may make you more likely to develop this condition:  Being older than age 73. Your risk for this condition increases with age. Diverticulosis is rare among people younger than age 9. By age 12, many people have it.  Eating a low-fiber diet.  Having frequent constipation.  Being overweight.  Not getting enough exercise.  Smoking.  Taking over-the-counter pain medicines, like aspirin and ibuprofen.  Having a family history of diverticulosis. What are the signs or symptoms? In most people, there are no symptoms of this condition. If you do have symptoms, they may include:  Bloating.  Cramps in the abdomen.  Constipation or diarrhea.  Pain in the lower left side of the abdomen. How is this diagnosed? This condition is most often diagnosed during an exam for other colon problems. Because diverticulosis usually has no symptoms, it often cannot be diagnosed independently. This condition may be diagnosed by:  Using a flexible scope to examine the  colon (colonoscopy).  Taking an X-ray of the colon after dye has been put into the colon (barium enema).  Doing a CT scan. How is this treated? You may not need treatment for this condition if you have never developed an infection related to diverticulosis. If you have had an infection before, treatment may include:  Eating a high-fiber diet. This may include eating more fruits, vegetables, and grains.  Taking a fiber supplement.  Taking a live bacteria supplement (probiotic).  Taking medicine to relax your colon.  Taking antibiotic medicines. Follow these instructions at home:  Drink 6-8 glasses of water or more each day to prevent constipation.  Try not to strain when you have a bowel movement.  If you have had an infection before:  Eat more fiber as directed by your health care provider or your diet and nutrition specialist (dietitian).  Take a fiber  supplement or probiotic, if your health care provider approves.  Take over-the-counter and prescription medicines only as told by your health care provider.  If you were prescribed an antibiotic, take it as told by your health care provider. Do not stop taking the antibiotic even if you start to feel better.  Keep all follow-up visits as told by your health care provider. This is important. Contact a health care provider if:  You have pain in your abdomen.  You have bloating.  You have cramps.  You have not had a bowel movement in 3 days. Get help right away if:  Your pain gets worse.  Your bloating becomes very bad.  You have a fever or chills, and your symptoms suddenly get worse.  You vomit.  You have bowel movements that are bloody or black.  You have bleeding from your rectum. Summary  Diverticulosis is a condition that develops when small pouches (diverticula) form in the wall of the large intestine (colon).  You may have a few pouches or many of them.  This condition is most often diagnosed during an exam for other colon problems.  If you have had an infection related to diverticulosis, treatment may include increasing the fiber in your diet, taking supplements, or taking medicines. This information is not intended to replace advice given to you by your health care provider. Make sure you discuss any questions you have with your health care provider. Document Released: 12/13/2003 Document Revised: 02/04/2016 Document Reviewed: 02/04/2016 Elsevier Interactive Patient Education  2017 [place].  Hemorrhoids Hemorrhoids are swollen veins in and around the rectum or anus. Hemorrhoids can cause pain, itching, or bleeding. Most of the time, they do not cause serious problems. They usually get better with diet changes, lifestyle changes, and other home treatments. Follow these instructions at home: Eating and drinking   Eat foods that have fiber, such as whole  grains, beans, nuts, fruits, and vegetables. Ask your doctor about taking products that have added fiber (fibersupplements).  Drink enough fluid to keep your pee (urine) clear or pale yellow. For Pain and Swelling   Take a warm-water bath (sitz bath) for 20 minutes to ease pain. Do this 3-4 times a day.  If directed, put ice on the painful area. It may be helpful to use ice between your warm baths.  Put ice in a plastic bag.  Place a towel between your skin and the bag.  Leave the ice on for 20 minutes, 2-3 times a day. General instructions   Take over-the-counter and prescription medicines only as told by your doctor.  Medicated creams  and medicines that are inserted into the anus (suppositories) may be used or applied as told.  Exercise often.  Go to the bathroom when you have the urge to poop (to have a bowel movement). Do not wait.  Avoid pushing too hard (straining) when you poop.  Keep the butt area dry and clean. Use wet toilet paper or moist paper towels.  Do not sit on the toilet for a long time. Contact a doctor if:  You have any of these:  Pain and swelling that do not get better with treatment or medicine.  Bleeding that will not stop.  Trouble pooping or you cannot poop.  Pain or swelling outside the area of the hemorrhoids. This information is not intended to replace advice given to you by your health care provider. Make sure you discuss any questions you have with your health care provider. Document Released: 12/25/2007 Document Revised: 08/23/2015 Document Reviewed: 11/29/2014 Elsevier Interactive Patient Education  2017 [place], diverticulosis and polyp information provided  Pamphlet on hemorrhoid banding  Avoid straining.  Benefiber 1 tablespoon twice daily  Limit toilet time to 5 minutes  Anusol cream as directed  Further recommendations to follow pending review of pathology report  Office visit with [name] in 6  weeks-July 3rd @ 9:30

## 2016-08-11 NOTE — Anesthesia Postprocedure Evaluation (Signed)
Anesthesia Post Note  Patient: [name]  Procedure(s) Performed: Procedure(s) (LRB): COLONOSCOPY WITH PROPOFOL (N/A) POLYPECTOMY  Patient location during evaluation: Short Stay Anesthesia Type: MAC Level of consciousness: awake and alert Pain management: satisfactory to patient Vital Signs Assessment: post-procedure vital signs reviewed and stable Respiratory status: spontaneous breathing Cardiovascular status: stable Anesthetic complications: no     Last Vitals:  Vitals:   08/11/16 1345 08/11/16 1406  BP: 135/80 138/85  Pulse: (!) 59   Resp: (!) 22 18  Temp:      Last Pain:  Vitals:   08/11/16 1406  TempSrc: Oral  PainSc:                  Drucie Opitz

## 2016-08-11 NOTE — Op Note (Signed)
[facility] Patient Name: [name] Procedure Date: 08/11/2016 12:51 PM MRN: [id number] Date of Birth: [birth year] Attending MD: Norvel Richards , MD CSN: [id number] Age: 56 Admit Type: Outpatient Procedure:                Colonoscopy Indications:              Hematochezia Providers:                Norvel Richards, MD, Jeanann Lewandowsky. Sharon Seller, RN,                            Randa Spike, Technician Referring MD:             Lysle Morales Medicines:                Midazolam mg IV Complications:            No immediate complications. Estimated Blood Loss:     Estimated blood loss was minimal. Procedure:                Pre-Anesthesia Assessment:                           - Prior to the procedure, a History and Physical                            was performed, and patient medications and                            allergies were reviewed. The patient's tolerance of                            previous anesthesia was also reviewed. The risks                            and benefits of the procedure and the sedation                            options and risks were discussed with the patient.                            All questions were answered, and informed consent                            was obtained. Prior Anticoagulants: The patient has                            taken no previous anticoagulant or antiplatelet                            agents. ASA Grade Assessment: II - A patient with                            mild systemic disease. After reviewing the risks  and benefits, the patient was deemed in                            satisfactory condition to undergo the procedure.                           After obtaining informed consent, the colonoscope                            was passed under direct vision. Throughout the                            procedure, the patient's blood pressure, pulse, and                            oxygen  saturations were monitored continuously. The                            EC-[id number] ([id number]) scope was introduced through                            the and advanced to the the cecum, identified by                            appendiceal orifice and ileocecal valve. The                            terminal ileum, ileocecal valve, appendiceal                            orifice, and rectum and the ileocecal valve,                            appendiceal orifice, and rectum were photographed.                            The entire colon was well visualized. The quality                            of the bowel preparation was adequate. Scope In: 1:14:15 PM Scope Out: 1:31:43 PM Scope Withdrawal Time: 0 hours 9 minutes 47 seconds  Total Procedure Duration: 0 hours 17 minutes 28 seconds  Findings:      The perianal and digital rectal examinations were normal.      Non-bleeding internal hemorrhoids were found during retroflexion. The       hemorrhoids were moderate, medium-sized and Grade I (internal       hemorrhoids that do not prolapse).      A 4 mm polyp was found in the rectum. The polyp was sessile.      Scattered small and large-mouthed diverticula were found in the entire       colon.      The exam was otherwise without abnormality on direct and retroflexion       views. The polyp was removed with a cold snare. Resection and retrieval  were complete. Estimated blood loss was minimal. Impression:               - Non-bleeding internal hemorrhoids.                           - One 4 mm polyp in the rectum, removed with a cold                            snare. Resected and retrieved.                           - Diverticulosis in the entire examined colon.                           - The examination was otherwise normal on direct                            and retroflexion views. Moderate Sedation:      Moderate (conscious) sedation was personally administered by an       anesthesia  professional. The following parameters were monitored: oxygen       saturation, heart rate, blood pressure, respiratory rate, EKG, adequacy       of pulmonary ventilation, and response to care. Total physician       intraservice time was 23 minutes. Recommendation:           - Patient has a contact number available for                            emergencies. The signs and symptoms of potential                            delayed complications were discussed with the                            patient. Return to normal activities tomorrow.                            Written discharge instructions were provided to the                            patient.                           - Resume previous diet.                           - Continue present medications.                           - Repeat colonoscopy in 6 weeks for surveillance                            based on pathology results. Anusol cream. Daily                            Benefiber.  Hemorrhoid banding pamphlets provided.                            May be a reasonably good hemorrhoid banding                            candidate. Procedure Code(s):        --- Professional ---                           [phone], Colonoscopy, flexible; with removal of                            tumor(s), polyp(s), or other lesion(s) by snare                            technique Diagnosis Code(s):        --- Professional ---                           K62.1, Rectal polyp                           K64.0, First degree hemorrhoids                           K92.1, Melena (includes Hematochezia)                           K57.30, Diverticulosis of large intestine without                            perforation or abscess without bleeding CPT copyright 2016 American Medical Association. All rights reserved. The codes documented in this report are preliminary and upon coder review may  be revised to meet current compliance requirements. Cristopher Estimable. Karrisa Didio,  MD Norvel Richards, MD 08/11/2016 1:44:07 PM This report has been signed electronically. Number of Addenda: 0

## 2016-08-11 NOTE — Transfer of Care (Signed)
Immediate Anesthesia Transfer of Care Note  Patient: [name]  Procedure(s) Performed: Procedure(s) with comments: COLONOSCOPY WITH PROPOFOL (N/A) - 130  POLYPECTOMY - colon  Patient Location: PACU  Anesthesia Type:MAC  Level of Consciousness: awake and patient cooperative  Airway & Oxygen Therapy: Patient Spontanous Breathing and Patient connected to nasal cannula oxygen  Post-op Assessment: Report given to RN and Post -op Vital signs reviewed and stable  Post vital signs: Reviewed and stable  Last Vitals: There were no vitals filed for this visit.  Last Pain:  Vitals:   08/11/16 1310  PainSc: 4          Complications: No apparent anesthesia complications

## 2016-08-11 NOTE — H&P (Signed)
@LOGO @   Primary Care Physician:  Raylene Everts, MD Primary Gastroenterologist:  Dr. Gala Romney  Pre-Procedure History & Physical: HPI:  [name] is a 56 y.o. male here for here for diagnostic colonoscopy. Intermittent hematochezia. No family history of colon cancer. Last colonoscopy over 2 decades ago.  Past Medical History:  Diagnosis Date  . Allergy   . Arthritis    neck and back  . Blood transfusion without reported diagnosis   . BPH (benign prostatic hyperplasia)   . Cancer [facility]) 2004   testicle  . Chronic kidney disease    kidney stones  . Left lumbar radiculopathy 06/12/2016  . Medical history non-contributory    Pt has scattered thoughts and uncertain of past medical history  . 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 Stones, urinary tract   . 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 BACK SURGERY     x5  . CERVICAL DISC SURGERY     x2  . testicular cancer  2004  . TONSILLECTOMY      Prior to Admission medications   Medication Sig Start Date End Date Taking? Authorizing Provider  Aspirin-Caffeine (BAYER BACK & BODY) 500-32.5 MG TABS Take 1 tablet by mouth 2 (two) times daily as needed (for pain).   Yes [provider]  polyethylene glycol-electrolytes (TRILYTE) 420 g solution Take 4,000 mLs by mouth as directed. 07/09/16  Yes [name], MD    Allergies as of 07/09/2016 - Review Complete 07/09/2016  Allergen Reaction Noted  . Acetaminophen Nausea Only and Other (See Comments) 01/17/2015  . Imodium [loperamide] Swelling 01/17/2015  . Morphine Other (See Comments) 02/25/2010  . Aleve [naproxen] Swelling 07/18/2015  . Cymbalta [duloxetine hcl] Swelling 07/09/2016  . Diclofenac sodium  02/25/2010  . Lyrica [pregabalin] Swelling 06/18/2016  . Vioxx [rofecoxib] Swelling 07/18/2015    Family History  Problem Relation Age of Onset  . COPD Mother   . Heart disease Mother   .  Other Father        Never knew his father.  . Thyroid disease Sister   . Arthritis Sister   . Heart disease Sister        bypass    Social History   Social History  . Marital status: Single    Spouse name: N/A  . Number of children: 1  . Years of education: 3   Occupational History  . retired     Psychologist, prison and probation services  . disabled    Social History Main Topics  . Smoking status: Current Every Day Smoker    Packs/day: 1.00    Years: 40.00    Types: Cigarettes    Start date: 03/31/1974  . Smokeless tobacco: Never Used  . Alcohol use No     Comment: Occasional; denied 07/09/16  . Drug use: Yes    Types: Cocaine     Comment: hx quit 87  . Sexual activity: Yes   Other Topics Concern  . Not on file   Social History Narrative   [employer] for 12 years   [employer] for 4 years      Never married   One daughter      Lives alone   Retail banker   Right-handed   Occasional caffeine use             Review of Systems: See HPI, otherwise negative ROS  Physical Exam: There  were no vitals taken for this visit. General:   Alert,  Well-developed, well-nourished, pleasant and cooperative in NAD Neck:  Supple; no masses or thyromegaly. No significant cervical adenopathy. Lungs:  Clear throughout to auscultation.   No wheezes, crackles, or rhonchi. No acute distress. Heart:  Regular rate and rhythm; no murmurs, clicks, rubs,  or gallops. Abdomen: Non-distended, normal bowel sounds.  Soft and nontender without appreciable mass or hepatosplenomegaly.  Pulses:  Normal pulses noted. Extremities:  Without clubbing or edema.  Impression:  Pleasant 56 year old gentleman with paper hematochezia. Agree he needs colonoscopy to further evaluate his symptoms.  Recommendations:  Diagnostic colonoscopy today.  The risks, benefits, limitations, alternatives and imponderables have been reviewed with the patient. Questions have been answered. All parties are agreeable.          Notice: This dictation was prepared with Dragon dictation along with smaller phrase technology. Any transcriptional errors that result from this process are unintentional and may not be corrected upon review.

## 2016-08-11 NOTE — Anesthesia Preprocedure Evaluation (Signed)
Anesthesia Evaluation  Patient identified by MRN, date of birth, ID band Patient awake    Reviewed: Allergy & Precautions, NPO status , Patient's Chart, lab work & pertinent test results  History of Anesthesia Complications Negative for: history of anesthetic complications  Airway Mallampati: II  TM Distance: >3 FB Neck ROM: Full    Dental no notable dental hx. (+) Dental Advisory Given, Teeth Intact   Pulmonary COPD (emphysema), Current Smoker,    Pulmonary exam normal breath sounds clear to auscultation       Cardiovascular + Peripheral Vascular Disease  Normal cardiovascular exam Rhythm:Regular Rate:Normal     Neuro/Psych PSYCHIATRIC DISORDERS  Neuromuscular disease negative neurological ROS  negative psych ROS   GI/Hepatic negative GI ROS, Neg liver ROS, (+)     substance abuse  ,   Endo/Other  negative endocrine ROS  Renal/GU   negative genitourinary   Musculoskeletal  (+) Arthritis ,   Abdominal   Peds negative pediatric ROS (+)  Hematology negative hematology ROS (+)   Anesthesia Other Findings Left lumbar radiculopathy  Reproductive/Obstetrics negative OB ROS                             Anesthesia Physical Anesthesia Plan  ASA: III  Anesthesia Plan: MAC   Post-op Pain Management:    Induction: Intravenous  Airway Management Planned: Simple Face Mask  Additional Equipment:   Intra-op Plan:   Post-operative Plan:   Informed Consent: I have reviewed the patients History and Physical, chart, labs and discussed the procedure including the risks, benefits and alternatives for the proposed anesthesia with the patient or authorized representative who has indicated his/her understanding and acceptance.     Plan Discussed with:   Anesthesia Plan Comments:         Anesthesia Quick Evaluation

## 2016-08-14 ENCOUNTER — Encounter: Payer: Self-pay | Admitting: Internal Medicine

## 2016-08-18 ENCOUNTER — Encounter (HOSPITAL_COMMUNITY): Payer: Self-pay | Admitting: Internal Medicine

## 2016-09-30 ENCOUNTER — Ambulatory Visit: Payer: Medicare Other | Admitting: Nurse Practitioner

## 2016-10-13 ENCOUNTER — Telehealth: Payer: Self-pay | Admitting: Family Medicine

## 2016-10-13 NOTE — Telephone Encounter (Signed)
New Message  Pt voiced he would like a sooner than later appt for him to stop smoking and to be on chantex.  Pt voiced wondering if he can be seen sooner than 7.25.18  Please f/u

## 2016-10-22 ENCOUNTER — Ambulatory Visit (INDEPENDENT_AMBULATORY_CARE_PROVIDER_SITE_OTHER): Payer: Medicare Other | Admitting: Family Medicine

## 2016-10-22 ENCOUNTER — Encounter: Payer: Self-pay | Admitting: Family Medicine

## 2016-10-22 VITALS — BP 138/80 | HR 76 | Temp 98.5°F | Resp 18 | Ht 72.0 in | Wt 198.0 lb

## 2016-10-22 DIAGNOSIS — Z720 Tobacco use: Secondary | ICD-10-CM | POA: Diagnosis not present

## 2016-10-22 MED ORDER — VARENICLINE TARTRATE 1 MG PO TABS: 1.0000 mg | ORAL_TABLET | Freq: Two times a day (BID) | ORAL | 2 refills | Status: DC

## 2016-10-22 MED ORDER — VARENICLINE TARTRATE 0.5 MG X 11 & 1 MG X 42 PO MISC: ORAL | 0 refills | Status: DC

## 2016-10-22 NOTE — Progress Notes (Signed)
Chief Complaint  Patient presents with  . Nicotine Dependence   Smoker since teens Wants to quit Having increased cough and sputum Waking up at night with cough and cannot catch breath Had a CT for cancer that was negative Has been unable to quit before.  Sister used chantix successfully and he would like to try a Rx. We discussed the side effects and proper use of med.  I am glad he is quitting in the setting of known aortic atherosclerosis and carotid stenosis.  Patient Active Problem List   Diagnosis Date Noted  . Rectal bleeding 07/09/2016  . Hemorrhoids 07/09/2016  . Chronic left-sided low back pain with left-sided sciatica 06/12/2016  . Left lumbar radiculopathy 06/12/2016  . Aortic atherosclerosis ([place]) 05/28/2016  . Panlobular emphysema ([place]) 05/28/2016  . Cervical disc disease 05/22/2016  . Kidney stones 05/22/2016  . Tobacco abuse 05/22/2016  . BPH (benign prostatic hyperplasia) 05/22/2016  . H/O: asbestos exposure 05/22/2016  . Substance abuse 05/22/2016  . Personality disorder 05/22/2016  . Influenza vaccine refused 05/22/2016  . Lumbar stenosis 07/24/2015  . CAROTID ARTERY STENOSIS 04/24/2010  . PREMATURE VENTRICULAR CONTRACTIONS 02/25/2010    Outpatient Encounter Prescriptions as of 10/22/2016  Medication Sig  . Aspirin-Caffeine (BAYER BACK & BODY) 500-32.5 MG TABS Take 1 tablet by mouth 2 (two) times daily as needed (for pain).  . varenicline (CHANTIX CONTINUING MONTH PAK) 1 MG tablet Take 1 tablet (1 mg total) by mouth 2 (two) times daily.  . varenicline (CHANTIX STARTING MONTH PAK) 0.5 MG X 11 & 1 MG X 42 tablet Take one 0.5 mg by mouth once daily for 3 days, then one 0.5 mg BID for 4 days, then one 1 mg po BID   No facility-administered encounter medications on file as of 10/22/2016.     Allergies  Allergen Reactions  . Acetaminophen Nausea Only and Other (See Comments)    Elevated liver enzymes  . Imodium [Loperamide] Swelling    facial  .  Morphine Other (See Comments)    Bradycardia   . Aleve [Naproxen] Swelling    eye  . Cymbalta [Duloxetine Hcl] Swelling    Facial swelling  . Diclofenac Sodium Other (See Comments)    Swelling   . Lyrica [Pregabalin] Swelling  . Vioxx [Rofecoxib] Swelling    Review of Systems  Constitutional: Negative for activity change and appetite change.  HENT: Negative for congestion and dental problem.   Eyes: Negative for discharge and visual disturbance.  Respiratory: Negative for chest tightness and shortness of breath.   Cardiovascular: Negative for chest pain and palpitations.  Gastrointestinal: Positive for blood in stool and constipation.       Blood with bowel movement, this week   Advised to f/u with Dr Sydell Axon  Genitourinary: Negative for difficulty urinating and frequency.  Musculoskeletal: Positive for back pain, gait problem, neck pain and neck stiffness.       Chronic  Skin: Negative for color change and wound.  Neurological: Negative for dizziness, numbness and headaches.  Hematological: Negative for adenopathy. Does not bruise/bleed easily.  Psychiatric/Behavioral: Negative for dysphoric mood and sleep disturbance. The patient is not nervous/anxious.     BP 138/80 (BP Location: Right Arm, Patient Position: Sitting, Cuff Size: Normal)   Pulse 76   Temp 98.5 F (36.9 C) (Temporal)   Resp 18   Ht 6' (1.829 m)   Wt 198 lb (89.8 kg)   SpO2 99%   BMI 26.85 kg/m   Physical Exam  Constitutional: He is oriented to person, place, and time. He appears well-developed and well-nourished. No distress.  HENT:  Head: Normocephalic.  Mouth/Throat: Oropharynx is clear and moist.  Eyes: Pupils are equal, round, and reactive to light. Conjunctivae are normal.  Cardiovascular: Normal rate, regular rhythm and normal heart sounds.   Pulmonary/Chest: Effort normal and breath sounds normal. He has no wheezes. He has no rales.  Musculoskeletal: Normal range of motion. He exhibits no  edema.  Neurological: He is alert and oriented to person, place, and time.  Skin: Skin is warm and dry.  Psychiatric:  Talkative, difficult to direct    ASSESSMENT/PLAN:  1. Tobacco abuse Prescribed chatix with discussion.   Patient Instructions  Take the chantix as directed I am glad you are trying to quit smoking Call for problems or side effects  See me on usual schedule     Raylene Everts, MD

## 2016-10-22 NOTE — Patient Instructions (Signed)
Take the chantix as directed I am glad you are trying to quit smoking Call for problems or side effects  See me on usual schedule

## 2016-12-15 ENCOUNTER — Ambulatory Visit (INDEPENDENT_AMBULATORY_CARE_PROVIDER_SITE_OTHER): Payer: Medicare Other | Admitting: Nurse Practitioner

## 2016-12-15 ENCOUNTER — Encounter: Payer: Self-pay | Admitting: Nurse Practitioner

## 2016-12-15 VITALS — BP 150/80 | HR 79 | Temp 97.8°F | Ht 72.0 in | Wt 200.0 lb

## 2016-12-15 DIAGNOSIS — K649 Unspecified hemorrhoids: Secondary | ICD-10-CM

## 2016-12-15 DIAGNOSIS — K625 Hemorrhage of anus and rectum: Secondary | ICD-10-CM
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
## 2016-12-15 NOTE — Assessment & Plan Note (Signed)
Persistent rectal bleeding likely related to hemorrhoids. Recent colonoscopy is reassuring. I will schedule the patient for hemorrhoid banding in our office. Return for follow-up based on recommendations made after his banding appointment. Continue over-the-counter treatments to help prevent constipation. Continue prescription rectal cream for hemorrhoid symptoms.

## 2016-12-15 NOTE — Patient Instructions (Signed)
1. Continue to eat a high-fiber diet to prevent constipation. 2. Use the rectal cream to help with your hemorrhoid symptoms until you can have them banded. 3. We will schedule you for hemorrhoid banding with Dr. Gala Romney. 4. Call if you have any questions or concerns.

## 2016-12-15 NOTE — Progress Notes (Signed)
cc'ed to pcp °

## 2016-12-15 NOTE — Assessment & Plan Note (Signed)
Significant internal hemorrhoids on colonoscopy, the patient was provided a hemorrhoid banding pamphlet and is interested in proceeding. Continues with rectal bleeding. Having rectal pain due to protruding internal hemorrhoid which will not reduce. Recommend he continue to use rectal cream to help with his symptoms, we will schedule him for hemorrhoid banding.

## 2016-12-30 ENCOUNTER — Encounter: Payer: Self-pay | Admitting: Family Medicine

## 2016-12-30 ENCOUNTER — Ambulatory Visit: Payer: Medicare Other | Admitting: Family Medicine

## 2017-01-13 ENCOUNTER — Encounter: Payer: Medicare Other | Admitting: Internal Medicine

## 2017-01-30 ENCOUNTER — Ambulatory Visit (INDEPENDENT_AMBULATORY_CARE_PROVIDER_SITE_OTHER): Payer: Medicare Other | Admitting: Internal Medicine

## 2017-01-30 ENCOUNTER — Encounter: Payer: Self-pay | Admitting: Internal Medicine

## 2017-01-30 VITALS — BP 127/72 | HR 44 | Temp 98.8°F | Ht 72.0 in | Wt 202.2 lb

## 2017-01-30 DIAGNOSIS — K648 Other hemorrhoids: Secondary | ICD-10-CM | POA: Diagnosis not present

## 2017-01-30 NOTE — Patient Instructions (Signed)
Avoid straining.  Benefiber or similar fiber supplement-1-2 tablespoons daily daily  Limit toilet time to 5 minutes  Call with any interim problems  Schedule followup appointment in 4 weeks from now

## 2017-01-30 NOTE — Progress Notes (Signed)
[place] banding procedure note:  The patient presents with symptomatic grade 3 hemorrhoids, unresponsive to maximal medical therapy, requesting rubber band ligation of his hemorrhoidal disease. All risks, benefits, and alternative forms of therapy were described and informed consent was obtained.  In the left lateral decubitus position, a DRE, utilizing nitroglycerin and Xylocaine topically, used for lubricant, reveal no abnormalities. Anoscopy performed revealed angry appearing circumferential hemorrhoidal tissue.   The decision was made to band the right anterior internal hemorrhoid;  the Roxie was used to perform band ligation without complication. Digital anorectal examination was then performed to assure proper positioning of the band; band and excellent position. No pension or pain. Elected to place a left lateral hemorrhoid band in similar fashion. This was done without difficulty. Band found to be in excellent position. A pinching or pain. Patient spends too much time sitting on the commode. I advised him to limit total time to 5 minutes. Dietary and behavioral recommendations were given along with follow-up instructions. The patient will return in  No complications were encountered and the patient tolerated the procedure well.

## 2017-03-03 ENCOUNTER — Ambulatory Visit: Payer: Medicare Other | Admitting: Internal Medicine

## 2017-03-05 ENCOUNTER — Emergency Department (HOSPITAL_COMMUNITY)
Admission: EM | Admit: 2017-03-05 | Discharge: 2017-03-05 | Disposition: A | Discharge status: Home / Self Care | Payer: Medicare Other | Attending: Emergency Medicine | Admitting: Emergency Medicine

## 2017-03-05 ENCOUNTER — Emergency Department (HOSPITAL_COMMUNITY): Payer: Medicare Other

## 2017-03-05 ENCOUNTER — Encounter (HOSPITAL_COMMUNITY): Payer: Self-pay | Admitting: Emergency Medicine

## 2017-03-05 DIAGNOSIS — Z885 Allergy status to narcotic agent status: Secondary | ICD-10-CM | POA: Diagnosis not present

## 2017-03-05 DIAGNOSIS — Y939 Activity, unspecified: Secondary | ICD-10-CM | POA: Diagnosis not present

## 2017-03-05 DIAGNOSIS — Y999 Unspecified external cause status: Secondary | ICD-10-CM | POA: Insufficient documentation

## 2017-03-05 DIAGNOSIS — Y929 Unspecified place or not applicable: Secondary | ICD-10-CM | POA: Insufficient documentation

## 2017-03-05 DIAGNOSIS — N189 Chronic kidney disease, unspecified: Secondary | ICD-10-CM | POA: Insufficient documentation

## 2017-03-05 DIAGNOSIS — M542 Cervicalgia: Secondary | ICD-10-CM | POA: Diagnosis not present

## 2017-03-05 DIAGNOSIS — S0083XA Contusion of other part of head, initial encounter: Secondary | ICD-10-CM | POA: Insufficient documentation

## 2017-03-05 DIAGNOSIS — G8929 Other chronic pain: Secondary | ICD-10-CM | POA: Diagnosis not present

## 2017-03-05 DIAGNOSIS — Z8547 Personal history of malignant neoplasm of testis: Secondary | ICD-10-CM | POA: Diagnosis not present

## 2017-03-05 DIAGNOSIS — R51 Headache: Secondary | ICD-10-CM | POA: Insufficient documentation

## 2017-03-05 DIAGNOSIS — R030 Elevated blood-pressure reading, without diagnosis of hypertension: Secondary | ICD-10-CM

## 2017-03-05 DIAGNOSIS — S0990XA Unspecified injury of head, initial encounter: Secondary | ICD-10-CM | POA: Diagnosis present

## 2017-03-05 DIAGNOSIS — F17210 Nicotine dependence, cigarettes, uncomplicated: Secondary | ICD-10-CM | POA: Insufficient documentation
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
## 2017-04-08 ENCOUNTER — Telehealth: Payer: Self-pay | Admitting: General Practice

## 2017-04-08 NOTE — Telephone Encounter (Signed)
I called the patient to let him know we moved his appt on 1/15th from 8am to 11am.  I was unable to reach him so I lmom.

## 2017-04-10 NOTE — Telephone Encounter (Signed)
Patient made aware of appt change.

## 2017-04-14 ENCOUNTER — Encounter: Payer: Self-pay | Admitting: Internal Medicine

## 2017-04-14 ENCOUNTER — Ambulatory Visit: Payer: Medicare Other | Admitting: Internal Medicine

## 2017-04-14 VITALS — BP 135/75 | HR 43 | Temp 98.2°F | Ht 72.0 in | Wt 204.4 lb

## 2017-04-14 DIAGNOSIS — K648 Other hemorrhoids: Secondary | ICD-10-CM | POA: Diagnosis not present

## 2017-04-14 NOTE — Patient Instructions (Signed)
Avoid straining.  Daily Metamucil  Limit toilet time to 5 minutes  Call with any interim problems  Schedule followup appointment in 3 months from now

## 2017-04-14 NOTE — Progress Notes (Signed)
[place] banding procedure note:  The patient presents with symptomatic grade 3 hemorrhoids - status post banding of the left lateral and right anterior columnpreviously. States, globally, all of his hemorrhage symptoms have improved. We are moving in the right direction as he states. He desires further banding today.  In the left lateral decubitus position, DRE  utilizing nitroglycerin and Xylocaine as lubricant, a small, reducible anterior hemorrhoid tag only..  The decision was made to band the Right posterior internal hemorrhoid, and the [place] was used to perform band ligation without complication. Digital anorectal examination was then performed to assure proper positioning of the band;  No pinching or pain.  I elected to place a band in the neutral position. This was also done without difficulty. It ended up on the left side on follow-up D re:. No pinching or pain. Still a residual tag anteriorly was noted. I placed a third man midline somewhat anteriorly this was done without difficulty or apparent complication. Banding gauge to follow-up DRE without pinching or pain. The patient was discharged home without pain or other issues. No complications were encountered and the patient tolerated the procedure well.

## 2017-05-28 ENCOUNTER — Encounter: Payer: Self-pay | Admitting: Family Medicine

## 2017-05-28 ENCOUNTER — Ambulatory Visit (INDEPENDENT_AMBULATORY_CARE_PROVIDER_SITE_OTHER): Payer: Medicare Other | Admitting: Family Medicine

## 2017-05-28 ENCOUNTER — Other Ambulatory Visit: Payer: Self-pay

## 2017-05-28 VITALS — BP 134/80 | HR 72 | Temp 98.3°F | Resp 18 | Ht 72.0 in | Wt 204.0 lb

## 2017-05-28 DIAGNOSIS — Z23 Encounter for immunization: Secondary | ICD-10-CM | POA: Diagnosis not present

## 2017-05-28 DIAGNOSIS — K625 Hemorrhage of anus and rectum: Secondary | ICD-10-CM | POA: Diagnosis not present

## 2017-05-28 DIAGNOSIS — Z720 Tobacco use: Secondary | ICD-10-CM

## 2017-05-28 DIAGNOSIS — J439 Emphysema, unspecified: Secondary | ICD-10-CM

## 2017-05-28 DIAGNOSIS — R202 Paresthesia of skin: Secondary | ICD-10-CM

## 2017-05-28 DIAGNOSIS — Z8547 Personal history of malignant neoplasm of testis: Secondary | ICD-10-CM | POA: Diagnosis not present

## 2017-05-28 DIAGNOSIS — M5090 Cervical disc disorder, unspecified, unspecified cervical region: Secondary | ICD-10-CM | POA: Diagnosis not present

## 2017-05-28 DIAGNOSIS — R200 Anesthesia of skin: Secondary | ICD-10-CM | POA: Diagnosis not present

## 2017-05-28 DIAGNOSIS — N529 Male erectile dysfunction, unspecified: Secondary | ICD-10-CM

## 2017-05-28 DIAGNOSIS — E559 Vitamin D deficiency, unspecified: Secondary | ICD-10-CM

## 2017-05-28 DIAGNOSIS — I493 Ventricular premature depolarization: Secondary | ICD-10-CM

## 2017-05-28 NOTE — Patient Instructions (Signed)
Need to go back to Dr Sydell Axon in GI Need to go back to DR Jeffie Pollock in urology My office can call these doctors to help with scheduling if you need.  We will refer to pulmonary specialist I am referring to new Neurology  Try to eat a heart healthy diet Walk and exercise to your tolerance  Lab testing must be done fasting I will send you a letter with your test results.  If there is anything of concern, we will call right away.   We will notify you of appropriate follow up

## 2017-05-28 NOTE — Progress Notes (Signed)
Chief Complaint  Patient presents with  . Annual Exam   [name] schedule himself for an annual exam today, but it has only been 11 months since his last physical, I explained to him that his insurance will not pay for 2 physical examinations and a 1 year period of time.  He wanted to come in because he has multiple issues to discuss and request multiple referrals to specialist. First.  He has a diagnosis of emphysema.  He is a smoker.  He is unwilling to quit.  He does want to see a pulmonary doctor because he is noticed that he is becoming more short of breath.  I recommend pulmonary function testing.  He would like to have this done with his consultant not prior.  He does not have any cough, sputum, hemoptysis, or serious symptoms.  He did have a chest CT done last year to screen for lung cancer, it was negative. Second.  He has a history of testicular cancer.  He is having increasing difficulty with erectile dysfunction.  He would like to have his testosterone checked.  He would like to go back to his urologist.  He states he is called Dr. Roni Bread several times but has had a prolonged wait on hold.  I told him that perhaps we could assist him in getting a follow-up. Third.  He wants referral to a different neurologist.  He has seen Dr. [name] at [facility] neurology.  He feels like she accused him of drug-seeking.  He does not want to go back to her but is having increasing numbness in his hands. Fourth.  He would like assistance in an appointment to get back in with his gastroenterologist.  He is still having rectal bleeding.  It has been a month since his last procedure. [name] is very talkative, circuitous, and difficult to follow.  He is difficult to direct.  I have difficulty in teasing out what he would like to be seen for, what symptoms he is having.  He rarely gives a direct answer to questions. It appears he has very poor nutrition.  He does not eat vegetables.  He states he has not  eaten anything green in a couple of months.  He tries to eat a lot of protein.  He does not eat beans.  He does not get regular exercise.  He states that the combination of his chronic back pain, and his shortness of breath prevent him from walking very far.  Patient Active Problem List   Diagnosis Date Noted  . Rectal bleeding 07/09/2016  . Hemorrhoids 07/09/2016  . Chronic left-sided low back pain with left-sided sciatica 06/12/2016  . Left lumbar radiculopathy 06/12/2016  . Aortic atherosclerosis ([place]) 05/28/2016  . 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 Cervical disc disease 05/22/2016  . Kidney stones 05/22/2016  . Tobacco abuse 05/22/2016  . BPH (benign prostatic hyperplasia) 05/22/2016  . H/O: asbestos exposure 05/22/2016  . Substance abuse ([place]) 05/22/2016  . Personality disorder ([place]) 05/22/2016  . Influenza vaccine refused 05/22/2016  . Lumbar stenosis 07/24/2015  . CAROTID ARTERY STENOSIS 04/24/2010  . PVC (premature ventricular contraction) 02/25/2010    Outpatient Encounter Medications as of 05/28/2017  Medication Sig  . OVER THE COUNTER MEDICATION [facility] as needed   No facility-administered encounter medications on file as of 05/28/2017.     Allergies  Allergen Reactions  . Acetaminophen Nausea Only and Other (See Comments)    Elevated liver enzymes  .  Imodium [Loperamide] Swelling    facial  . Morphine Other (See Comments)    Bradycardia   . Aleve [Naproxen] Swelling    eye  . Cymbalta [Duloxetine Hcl] Swelling    Facial swelling  . Diclofenac Sodium Swelling    Swelling   . Lyrica [Pregabalin] Swelling  . Vioxx [Rofecoxib] Swelling    Review of Systems  Constitutional: Negative for activity change and appetite change.  HENT: Negative for congestion and dental problem.   Eyes: Negative for discharge and visual disturbance.  Respiratory: Positive for shortness of breath. Negative for cough and chest tightness.   Cardiovascular:  Negative for chest pain and palpitations.  Gastrointestinal: Positive for blood in stool and constipation.       Blood with bowel movement, persistent   Advised to f/u with Dr Sydell Axon  Genitourinary: Negative for difficulty urinating and frequency.       ED  Musculoskeletal: Positive for back pain, gait problem, neck pain and neck stiffness.       Chronic  Skin: Negative for color change and wound.  Neurological: Negative for dizziness, numbness and headaches.  Hematological: Negative for adenopathy. Does not bruise/bleed easily.  Psychiatric/Behavioral: Negative for dysphoric mood and sleep disturbance. The patient is not nervous/anxious.     BP 134/80 (BP Location: Right Arm, Patient Position: Sitting, Cuff Size: Large)   Pulse 72   Temp 98.3 F (36.8 C) (Temporal)   Resp 18   Ht 6' (1.829 m)   Wt 204 lb 0.6 oz (92.6 kg)   SpO2 100%   BMI 27.67 kg/m   Physical Exam  Constitutional: He is oriented to person, place, and time. He appears well-developed and well-nourished. No distress.  HENT:  Head: Normocephalic.  Mouth/Throat: Oropharynx is clear and moist.  Eyes: Conjunctivae are normal. Pupils are equal, round, and reactive to light.  Cardiovascular: Regular rhythm and normal heart sounds.  Bradycardia to my exam rate 54  Pulmonary/Chest: Effort normal and breath sounds normal. He has no wheezes. He has no rales.  Musculoskeletal: Normal range of motion. He exhibits no edema.  Neurological: He is alert and oriented to person, place, and time.  Skin: Skin is warm and dry.  Psychiatric:  Talkative, difficult to direct    ASSESSMENT/PLAN:  1. Tobacco abuse Unwilling to quit or listen to my discussion regarding the health benefits of stopping.  2. Erectile dysfunction, unspecified erectile dysfunction type - Testosterone Total,Free,Bio, Males  3. History of testicular cancer  4. Cervical disc disease  5. Numbness and tingling of left hand - Ambulatory referral to  Neurology  6. Pulmonary emphysema, unspecified emphysema type ([place]) - Ambulatory referral to Pulmonology  7. Rectal bleeding Advised to see his gastroenterologist  8. Vitamin D deficiency - VITAMIN D 25 Hydroxy (Vit-D Deficiency, Fractures)  9. PVC (premature ventricular contraction) By history. On today's exam.  Bradycardic  10. Need for influenza vaccination Discussed.  Refuses all immunizations.   Patient Instructions  Need to go back to Dr Sydell Axon in GI Need to go back to DR Jeffie Pollock in urology My office can call these doctors to help with scheduling if you need.  We will refer to pulmonary specialist I am referring to new Neurology  Try to eat a heart healthy diet Walk and exercise to your tolerance  Lab testing must be done fasting I will send you a letter with your test results.  If there is anything of concern, we will call right away.   We will notify you  of appropriate follow up     Raylene Everts, MD

## 2017-05-29 ENCOUNTER — Ambulatory Visit: Payer: Medicare Other | Admitting: Neurology

## 2017-05-29 ENCOUNTER — Encounter: Payer: Self-pay | Admitting: Neurology

## 2017-05-29 VITALS — BP 148/74 | HR 44 | Ht 72.0 in | Wt 204.2 lb

## 2017-05-29 DIAGNOSIS — R202 Paresthesia of skin: Secondary | ICD-10-CM | POA: Diagnosis not present

## 2017-05-29 LAB — COMPLETE METABOLIC PANEL WITH GFR
AG Ratio: 1.4 (calc) (ref 1.0–2.5)
ALT: 13 U/L (ref 9–46)
AST: 16 U/L (ref 10–35)
Albumin: 4.3 g/dL (ref 3.6–5.1)
Alkaline phosphatase (APISO): 65 U/L (ref 40–115)
BUN: 10 mg/dL (ref 7–25)
CO2: 31 mmol/L (ref 20–32)
Calcium: 9.6 mg/dL (ref 8.6–10.3)
Chloride: 105 mmol/L (ref 98–110)
Creat: 1.07 mg/dL (ref 0.70–1.33)
GFR, Est African American: 88 mL/min/{1.73_m2} (ref >=60)
GFR, Est Non African American: 76 mL/min/{1.73_m2} (ref >=60)
Globulin: 3 g/dL (calc) (ref 1.9–3.7)
Glucose, Bld: 68 mg/dL (ref 65–139)
Potassium: 4.7 mmol/L (ref 3.5–5.3)
Sodium: 141 mmol/L (ref 135–146)
Total Bilirubin: 0.5 mg/dL (ref 0.2–1.2)
Total Protein: 7.3 g/dL (ref 6.1–8.1)

## 2017-05-29 LAB — TESTOSTERONE TOTAL,FREE,BIO, MALES
Albumin: 4.3 g/dL (ref 3.6–5.1)
Sex Hormone Binding: 67 nmol/L (ref 22–77)
Testosterone, Bioavailable: 91.7 ng/dL — ABNORMAL LOW (ref >=110.0)
Testosterone, Free: 46.6 pg/mL (ref 46.0–224.0)
Testosterone: 629 ng/dL (ref 250–827)

## 2017-05-29 LAB — URINALYSIS, ROUTINE W REFLEX MICROSCOPIC
Bacteria, UA: NONE SEEN /HPF
Bilirubin Urine: NEGATIVE
Glucose, UA: NEGATIVE
Hgb urine dipstick: NEGATIVE
Hyaline Cast: NONE SEEN /LPF
Ketones, ur: NEGATIVE
Leukocytes, UA: NEGATIVE
Nitrite: NEGATIVE
RBC / HPF: NONE SEEN /HPF (ref 0–2)
Specific Gravity, Urine: 1.018 (ref 1.001–1.03)
Squamous Epithelial / LPF: NONE SEEN /HPF (ref <=5)
WBC, UA: NONE SEEN /HPF (ref 0–5)
pH: 5.5 (ref 5.0–8.0)

## 2017-05-29 LAB — LIPID PANEL
Cholesterol: 182 mg/dL (ref <200)
HDL: 50 mg/dL (ref >40)
LDL Cholesterol (Calc): 117 mg/dL (calc) — ABNORMAL HIGH
Non-HDL Cholesterol (Calc): 132 mg/dL (calc) — ABNORMAL HIGH (ref <130)
Total CHOL/HDL Ratio: 3.6 (calc) (ref <5.0)
Triglycerides: 63 mg/dL (ref <150)

## 2017-05-29 LAB — VITAMIN D 25 HYDROXY (VIT D DEFICIENCY, FRACTURES): Vit D, 25-Hydroxy: 10 ng/mL — ABNORMAL LOW (ref 30–100)

## 2017-05-29 NOTE — Patient Instructions (Signed)
NCS/EMG of the left arm.  We will call you with the results of your testing and determine the next step.

## 2017-05-29 NOTE — Progress Notes (Signed)
[name] - Initial Visit   Date: 05/29/17  [name] MRN: [id number] DOB: Feb 02, 1961   Dear [name]:  Thank you for your kind referral of [name] for consultation of left hand numbness. Although his history is well known to you, please allow [name] to reiterate it for the purpose of our medical record. The patient was accompanied to the clinic by self.   History of Present Illness: [name] is a 57 y.o. right-handed male with testicular cancer s/p orchiectomy and pelvic surgery (2004), CKD, s/p lumbar surgery x 4 as well as cervical surgery x 2 presenting for evaluation of left hand numbness.    Starting around early February 2018, he developed numbness over the last two fingers on the left.  Symptoms are constant and worse at night.  He has noticed reduced grip strength on the left.  He denies similar symptoms on the right hand.  Yesterday, he was having labs drawn and reports tingling and electrical sensation that went from his medial forearm into his middle finger.  He has chronic neck and low back pain and has underwent a number of surgeries to this region.  He has seen Dr. [name] at [facility] for his low back and radicular leg pain. Patient informed that my office does not manage chronic pain.  He was easily angered and argumentative as I tried to clarify the details of his symptoms, therefore, the visit was focused to his left hand symptoms.    Out-side paper records, electronic medical record, and images have been reviewed where available and summarized as:  NCS/EMG of the legs 06/27/2016:  This is an abnormal study. 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 There is also evidence of mild chronic bilateral lumbosacral radiculopathy. There is no evidence of active process.   Past Medical History:  Diagnosis Date  . Allergy   . Arthritis    neck and back  . Blood transfusion without  reported diagnosis   . BPH (benign prostatic hyperplasia)   . Cancer [facility]) 2004   testicle  . Chronic kidney disease    kidney stones  . Left lumbar radiculopathy 06/12/2016  . Medical history non-contributory    Pt has scattered thoughts and uncertain of past medical history  . 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 Stones, urinary tract   . Substance abuse ([place])    prescribed oxydcodone- 10 years    Past Surgical History:  Procedure Laterality Date  . BACK SURGERY     x5  . CERVICAL DISC SURGERY     x2  . COLONOSCOPY WITH PROPOFOL N/A 08/11/2016   Procedure: COLONOSCOPY WITH PROPOFOL;  Surgeon: [name], MD;  [facility];  Service: Endoscopy;  Laterality: N/A;  130   . POLYPECTOMY  08/11/2016   Procedure: POLYPECTOMY;  Surgeon: [name], MD;  Location: [facility];  Service: Endoscopy;;  colon  . testicular cancer  2004  . TONSILLECTOMY       Medications:  Outpatient Encounter Medications as of 05/29/2017  Medication Sig  . OVER THE COUNTER MEDICATION [facility] as needed   No facility-administered encounter medications on file as of 05/29/2017.      Allergies:  Allergies  Allergen Reactions  . Acetaminophen Nausea Only and Other (See Comments)    Elevated liver enzymes  . Imodium [Loperamide] Swelling    facial  . Morphine Other (See Comments)    Bradycardia   .  Aleve [Naproxen] Swelling    eye  . Cymbalta [Duloxetine Hcl] Swelling    Facial swelling  . Diclofenac Sodium Swelling    Swelling   . Lyrica [Pregabalin] Swelling  . Vioxx [Rofecoxib] Swelling    Family History: Family History  Problem Relation Age of Onset  . COPD Mother   . Heart disease Mother   . Other Father        Never knew his father.  . Thyroid disease Sister   . Arthritis Sister   . Heart disease Sister        bypass  . Colon cancer Neg Hx     Social History: Social History   Tobacco Use  . Smoking status: Current Every Day Smoker     Packs/day: 1.00    Years: 40.00    Pack years: 40.00    Types: Cigarettes    Start date: 03/31/1974  . Smokeless tobacco: Never Used  Substance Use Topics  . Alcohol use: Yes    Comment: Occasional  . Drug use: No    Comment: Remote hx of cocaine, quit [place] Narrative   [employer] for 12 years   [employer] for 40 years      Never married   One daughter      Lives alone   Retail banker   Right-handed   Occasional caffeine use          Review of Systems:  CONSTITUTIONAL: No fevers, chills, night sweats, or weight loss.   EYES: No visual changes or eye pain ENT: No hearing changes.  No history of nose bleeds.   RESPIRATORY: No cough, wheezing and shortness of breath.   CARDIOVASCULAR: Negative for chest pain, and palpitations.   GI: Negative for abdominal discomfort, blood in stools or black stools.  No recent change in bowel habits.   GU:  No history of incontinence.   MUSCLOSKELETAL: + history of joint pain or swelling.  No myalgias.   SKIN: Negative for lesions, rash, and itching.   HEMATOLOGY/ONCOLOGY: Negative for prolonged bleeding, bruising easily, and swollen nodes.  +history of cancer.   ENDOCRINE: Negative for cold or heat intolerance, polydipsia or goiter.   PSYCH:  No depression or anxiety symptoms.   NEURO: As Above.   Vital Signs:  BP (!) 148/74   Pulse (!) 44   Ht 6' (1.829 m)   Wt 204 lb 4 oz (92.6 kg)   SpO2 95%   BMI 27.70 kg/m    General Medical Exam:   General:  Well appearing, comfortable.   Eyes/ENT: see cranial nerve examination.   Neck: No masses appreciated.  Full range of motion without tenderness.  No carotid bruits. Respiratory:  Clear to auscultation, good air entry bilaterally.   Cardiac:  Regular rate and rhythm, no murmur.   Extremities:  No deformities, edema, or skin discoloration.  Skin:  No rashes or lesions.  Neurological Exam: MENTAL STATUS including orientation to time,  place, person, recent and remote memory, attention span and concentration, language, and fund of knowledge is normal. Speech is not dysarthric.  CRANIAL NERVES: II:  No visual field defects.  Unremarkable fundi.   III-IV-VI: Pupils equal round and reactive to light.  Normal conjugate, extra-ocular eye movements in all directions of gaze.  No nystagmus.  No ptosis.   V:  Normal facial sensation.     VII:  Normal facial symmetry and movements.  VIII:  Normal hearing and vestibular function.   IX-X:  Normal palatal movement.   XI:  Normal shoulder shrug and head rotation.   XII:  Normal tongue strength and range of motion, no deviation or fasciculation.  MOTOR:  No atrophy, fasciculations or abnormal movements.  No pronator drift.  Tone is normal.    Right Upper Extremity:    Left Upper Extremity:    Deltoid  5/5   Deltoid  5/5   Biceps  5/5   Biceps  5/5   Triceps  5/5   Triceps  5/5   Wrist extensors  5/5   Wrist extensors  5/5   Wrist flexors  5/5   Wrist flexors  5/5   Finger extensors  5/5   Finger extensors  5/5   Finger flexors  5/5   Finger flexors  5/5   Dorsal interossei  5/5   Dorsal interossei  5/5   Abductor pollicis  5/5   Abductor pollicis  5/5   Tone (Ashworth scale)  0  Tone (Ashworth scale)  0   Right Lower Extremity:    Left Lower Extremity:    Hip flexors  5/5   Hip flexors  5/5   Hip extensors  5/5   Hip extensors  5/5   Knee flexors  5/5   Knee flexors  5/5   Knee extensors  5/5   Knee extensors  5/5   Dorsiflexors  5/5   Dorsiflexors  5/5   Plantarflexors  5/5   Plantarflexors  5/5   Toe extensors  5/5   Toe extensors  5/5   Toe flexors  5/5   Toe flexors  5/5   Tone (Ashworth scale)  0  Tone (Ashworth scale)  0   MSRs:  Right                                                                 Left brachioradialis 2+  brachioradialis 2+  biceps 2+  biceps 2+  triceps 2+  triceps 2+  patellar 2+  patellar 2+  ankle jerk tr  ankle jerk tr  Hoffman no  Hoffman  no  plantar response down  plantar response down   SENSORY:  Reduced vibration at the toes, otherwise vibratory sensation intact.  Pin prick and temperature intact throughout.   COORDINATION/GAIT: Normal finger-to- nose-finger  Intact rapid alternating movements bilaterally.  Gait is antalgic, unassisted, and slow.   IMPRESSION: 1.  Left hand paresthesias involving the 4th/5th digits - ?ulnar neuropathy vs. C8 radiculopathy.  Proceed with NCS/EMG of the left arm to better localize his symptoms.  I have also asked him to pay attention as to whether elbow position exacerbates his symptoms.    2.  Chronic pain involving the neck and low back.  He has underwent cervical surgery x 2 and lumbar surgery x 4 by Dr. Joya Salm.  Patient informed my office does not manage chronic pain.  At this time, he is not requesting any medication and has not benefited from PT in the past.    Further recommendations will be based on the results of his testing.  Thank you for allowing me to participate in patient's care.  If I can answer any additional questions, I would be pleased to do so.  Sincerely,    Yanuel Tagg K. Posey Pronto, DO

## 2017-06-01 ENCOUNTER — Encounter: Payer: Self-pay | Admitting: Family Medicine

## 2017-06-02 ENCOUNTER — Encounter: Payer: Self-pay | Admitting: Internal Medicine

## 2017-06-08 ENCOUNTER — Encounter: Payer: Self-pay | Admitting: Family Medicine

## 2017-06-18 ENCOUNTER — Encounter: Payer: Self-pay | Admitting: Pulmonary Disease

## 2017-06-18 ENCOUNTER — Ambulatory Visit: Payer: Medicare Other | Admitting: Pulmonary Disease

## 2017-06-18 VITALS — BP 128/74 | HR 45 | Ht 72.0 in | Wt 205.0 lb

## 2017-06-18 DIAGNOSIS — J431 Panlobular emphysema: Secondary | ICD-10-CM

## 2017-06-18 DIAGNOSIS — Z77090 Contact with and (suspected) exposure to asbestos: Secondary | ICD-10-CM

## 2017-06-18 DIAGNOSIS — Z720 Tobacco use: Secondary | ICD-10-CM

## 2017-06-18 DIAGNOSIS — J438 Other emphysema: Secondary | ICD-10-CM

## 2017-06-18 DIAGNOSIS — R918 Other nonspecific abnormal finding of lung field: Secondary | ICD-10-CM

## 2017-06-18 NOTE — Progress Notes (Signed)
Subjective:    Patient ID: [name], male    DOB: [birth year], 59 y.o.   MRN: [id number]  HPI  Chief Complaint  Patient presents with  . Pulm Consult    Referred by [name] for pulmonary emphysema. Has worked around asbestos in [place].     57 year old smoker presents for evaluation of asthma. He denies significant dyspnea on exertion.  He admits to coughing especially when he smokes.  He reports the coughing is been worse in the past few weeks, minimally productive of clear sputum. On review of PCP notes it seems like he requested referral to pulmonary for evaluation of asthma. Screening CT chest from 05/2016 was reviewed which shows centrilobular and paraseptal emphysema.  This was read as lung RADS category 2 with multiple calcified granulomas scattered and noncalcified pulmonary nodules measuring 3.6 mm or less in size.  He is very vague about his symptoms.  It appears that he has seen a neurologist for left hand paresthesias and nerve conduction studies planned he also has left leg sciatica.  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  He is concerned about asbestos exposure once when he worked at [employer].  He is vague about where the asbestos was located on states that this was in the maintenance lines.  He smokes about 1 pack/day, more than 35 pack years and admits that smoking makes his cough worse.  He used to be very active and was able to run for 2 miles a day but lately leads more of a sedentary lifestyle due to neck and back pain.  Very difficult to take history here as his thoughts are very scattered and I note that this has been the case on his prior PCP visits too.  Rest of the history is obtained from chart review   spirometry shows ratio 64, FEV1 of 69% and FVC of 84%  Past Medical History:  Diagnosis Date  . Allergy   . Arthritis    neck and back  . Blood transfusion without reported diagnosis   . BPH (benign prostatic hyperplasia)   . Cancer [facility]) 2004   testicle  . Chronic kidney disease    kidney stones  . Left lumbar radiculopathy 06/12/2016  . Medical history non-contributory    Pt has scattered thoughts and uncertain of past medical history  . 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 Stones, urinary tract   . Substance abuse ([place])    prescribed oxydcodone- 10 years   Past Surgical History:  Procedure Laterality Date  . BACK SURGERY     x5  . CERVICAL DISC SURGERY     x2  . COLONOSCOPY WITH PROPOFOL N/A 08/11/2016   Procedure: COLONOSCOPY WITH PROPOFOL;  Surgeon: [name], MD;  [facility];  Service: Endoscopy;  Laterality: N/A;  130   . POLYPECTOMY  08/11/2016   Procedure: POLYPECTOMY;  Surgeon: [name], MD;  [facility];  Service: Endoscopy;;  colon  . testicular cancer  2004  . TONSILLECTOMY      Allergies  Allergen Reactions  . Acetaminophen Nausea Only and Other (See Comments)    Elevated liver enzymes  . Imodium [Loperamide] Swelling    facial  . Morphine Other (See Comments)    Bradycardia   . Aleve [Naproxen] Swelling    eye  . Cymbalta [  Duloxetine Hcl] Swelling    Facial swelling  . Diclofenac Sodium Swelling    Swelling   . Lyrica [Pregabalin] Swelling  . Vioxx [Rofecoxib] Swelling   Allergies  Allergen Reactions  . Acetaminophen Nausea Only and Other (See Comments)    Elevated liver enzymes  . Imodium [Loperamide] Swelling    facial  . Morphine Other (See Comments)    Bradycardia   . Aleve [Naproxen] Swelling    eye  . Cymbalta [Duloxetine Hcl] Swelling    Facial swelling  . Diclofenac Sodium Swelling    Swelling   . Lyrica [Pregabalin] Swelling  . Vioxx [Rofecoxib] Swelling     Social History   Socioeconomic History  . Marital status: Single    Spouse name: Not on file  . Number of children: 1  . Years of education: 17   . Highest education level: Not on file  Occupational History  . Occupation: retired    Comment: Psychologist, prison and probation services  . Occupation: disabled  Social Needs  . Financial resource strain: Not on file  . Food insecurity:    Worry: Not on file    Inability: Not on file  . Transportation needs:    Medical: Not on file    Non-medical: Not on file  Tobacco Use  . Smoking status: Current Every Day Smoker    Packs/day: 1.00    Years: 40.00    Pack years: 40.00    Types: Cigarettes    Start date: 03/31/1974  . Smokeless tobacco: Never Used  Substance and Sexual Activity  . Alcohol use: Yes    Comment: Occasional  . Drug use: No    Types: Cocaine    Comment: Remote hx of cocaine, quit 1989  . Sexual activity: Yes  Lifestyle  . Physical activity:    Days per week: Not on file    Minutes per session: Not on file  . Stress: Not on file  Relationships  . Social connections:    Talks on phone: Not on file    Gets together: Not on file    Attends religious service: Not on file    Active member of club or organization: Not on file    Attends meetings of clubs or organizations: Not on file    Relationship status: Not on file  . Intimate partner violence:    Fear of current or ex partner: Not on file    Emotionally abused: Not on file    Physically abused: Not on file    Forced sexual activity: Not on file  Other Topics Concern  . Not on file  Social History Narrative   [employer] for 12 years   [employer] for 56 years      Never married   One daughter      Lives alone   Retail banker   Right-handed   Occasional caffeine use           Family History  Problem Relation Age of Onset  . COPD Mother   . Heart disease Mother   . Other Father        Never knew his father.  . Thyroid disease Sister   . Arthritis Sister   . Heart disease Sister        bypass  . Colon cancer Neg Hx       Review of Systems   Constitutional: negative for anorexia, fevers and  sweats  Eyes: negative for irritation, redness and visual disturbance  Ears, nose, mouth, throat, and face: negative for earaches, epistaxis, nasal congestion and sore throat  Respiratory: negative for  dyspnea on exertion, sputum and wheezing  Cardiovascular: negative for chest pain, dyspnea, lower extremity edema, orthopnea, palpitations and syncope  Gastrointestinal: negative for abdominal pain, constipation, diarrhea, melena, nausea and vomiting  Genitourinary:negative for dysuria, frequency and hematuria  Hematologic/lymphatic: negative for bleeding, easy bruising and lymphadenopathy  Musculoskeletal:negative for arthralgias, muscle weakness and stiff joints  Neurological: negative for coordination problems, gait problems, headaches and weakness  Endocrine: negative for diabetic symptoms including polydipsia, polyuria and weight loss     Objective:   Physical Exam  Gen. Tall, well built, well-nourished, in no distress, flat affect ENT - no lesions, no post nasal drip Neck: No JVD, no thyromegaly, no carotid bruits Lungs: no use of accessory muscles, no dullness to percussion, clear without rales or rhonchi  Cardiovascular: Rhythm regular, s1s2 brady, no murmurs or gallops, no peripheral edema Abdomen: soft and non-tender, no hepatosplenomegaly, BS normal. Musculoskeletal: No deformities, no cyanosis or clubbing Neuro:  alert, non focal       Assessment & Plan:

## 2017-06-18 NOTE — Patient Instructions (Signed)
Screening CT chest will be scheduled.  You have moderate emphysema in your lungs. You have to quit smoking  we can consider medications if breathing worse

## 2017-06-18 NOTE — Assessment & Plan Note (Signed)
Moderate degree. He does not seem to require medications since he is not very dyspneic. Main intervention here would be smoking cessation and that was emphasized.  I reassured him that there was no evidence of asbestos in his lungs

## 2017-06-18 NOTE — Assessment & Plan Note (Signed)
Annual follow-up surveillance CT chest will be obtained

## 2017-06-18 NOTE — Assessment & Plan Note (Signed)
Counseled about tobacco cessation.

## 2017-06-30 ENCOUNTER — Ambulatory Visit (INDEPENDENT_AMBULATORY_CARE_PROVIDER_SITE_OTHER): Payer: Medicare Other | Admitting: Neurology

## 2017-06-30 DIAGNOSIS — R202 Paresthesia of skin: Secondary | ICD-10-CM | POA: Diagnosis not present

## 2017-06-30 DIAGNOSIS — G5622 Lesion of ulnar nerve, left upper limb: Secondary | ICD-10-CM | POA: Diagnosis not present

## 2017-06-30 NOTE — Progress Notes (Signed)
    Follow-up Visit   Date: 06/30/17    Gianmarco Roye MRN: [id number] DOB: [birth year]   Interim History: Mivaan Corbitt is a 57 y.o. 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  He continues to have numbness and tingling especially over the last 2 fingers on the left as well as weakness of the hand.  Exam shows FDI atrophy and 4/5 finger abductors. Finger extension is is 5/5.   Electrodiagnostic testing shows left ulnar neuropathy with slowing across the elbow, purely demyelinating in type. These findings are consistent with his clinical history.  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  Conservative therapies with minimizing repeated compression to the ulnar nerve as well as avoiding hyperflexion was discussed. Recommend that he use a soft elbow pad or wrap a towel around the elbow region, especially at night time.  Additionally, I would recommend starting occupational therapy for ulnar neuropathy, but he would like to hold off on this for now. If he changes his mind, he will call our office for the referral.   Greater than 80% of this 15 minute visit was spent in counseling, explanation of diagnosis, planning of further management, and coordination of care, excluding procedure time.   Thank you for allowing me to participate in patient's care.  If I can answer any additional questions, I would be pleased to do so.    Sincerely,    Calyn Rubi K. Posey Pronto, DO

## 2017-06-30 NOTE — Procedures (Signed)
[facility]  [address], [address]  [place], [place] [address] Tel: [phone] Fax:  [phone] Test Date:  06/30/2017  Patient: [name] DOB: 1960-07-08 Physician: Narda Amber, DO  Sex: Male Height: 6\' 0"  Ref Phys: Narda Amber, DO  ID#: [id number] Temp: 34.2C Technician:    Patient Complaints: This is a 57 year-old man referred for evaluation of left hand weakness and paresthesias.  NCV & EMG Findings: Extensive electrodiagnostic testing of the left upper extremity shows:  1. Left median and mixed palmar sensory responses are within normal limits. Left ulnar sensory response shows prolonged latency (3.3 ms) and normal amplitude.  2. Left median motor responses within normal limits. Left ulnar motor response shows decreased conduction velocity and conduction block across the elbow (A Elbow-B Elbow, 36 m/s).   3. Chronic motor axon loss changes are seen affecting the ulnar innervated muscles, without accompanied active denervation.   Impression: Left ulnar neuropathy with slowing across the elbow, purely demyelinating in type.   ___________________________ Narda Amber, DO    Nerve Conduction Studies Anti Sensory Summary Table   Site NR Peak (ms) Norm Peak (ms) P-T Amp (V) Norm P-T Amp  Left Median Anti Sensory (2nd Digit)  34.2C  Wrist    3.6 <3.6 15.7 >15  Left Ulnar Anti Sensory (5th Digit)  34.2C  Wrist    3.3 <3.1 12.5 >10   Motor Summary Table   Site NR Onset (ms) Norm Onset (ms) O-P Amp (mV) Norm O-P Amp Site1 Site2 Delta-0 (ms) Dist (cm) Vel (m/s) Norm Vel (m/s)  Left Median Motor (Abd Poll Brev)  34.2C  Wrist    3.4 <4.0 8.5 >6 Elbow Wrist 6.1 34.0 56 >50  Elbow    9.5  8.2         Left Ulnar Motor (Abd Dig Minimi)  34.2C  Wrist    2.4 <3.1 9.5 >7 B Elbow Wrist 4.8 29.0 60 >50  B Elbow    7.2  9.0  A Elbow B Elbow 2.8 10.0 36 >50  A Elbow    10.0  7.1          Comparison Summary Table   Site NR Peak (ms) Norm Peak (ms) P-T Amp  (V) Site1 Site2 Delta-P (ms) Norm Delta (ms)  Left Median/Ulnar Palm Comparison (Wrist - 8cm)  34.2C  Median Palm    1.8 <2.2 27.3 Median Palm Ulnar Palm 0.1   Ulnar Palm    1.7 <2.2 7.9       EMG   Side Muscle Ins Act Fibs Psw Fasc Number Recrt Dur Dur. Amp Amp. Poly Poly. Comment  Left 1stDorInt Nml Nml Nml Nml 2- Rapid Many 1+ Many 1+ Nml Nml N/A  Left Ext Indicis Nml Nml Nml Nml Nml Nml Nml Nml Nml Nml Nml Nml N/A  Left PronatorTeres Nml Nml Nml Nml Nml Nml Nml Nml Nml Nml Nml Nml N/A  Left Flex Carp Uln Nml Nml Nml Nml 1- Rapid Some 1+ Some 1+ Nml Nml N/A  Left Triceps Nml Nml Nml Nml Nml Nml Nml Nml Nml Nml Nml Nml N/A  Left Deltoid Nml Nml Nml Nml Nml Nml Nml Nml Nml Nml Nml Nml N/A  Left ABD Dig Min Nml Nml Nml Nml 2- Rapid Some 1+ Some 1+ Nml Nml N/A      Waveforms:

## 2017-07-01 ENCOUNTER — Ambulatory Visit (HOSPITAL_COMMUNITY): Admission: RE | Admit: 2017-07-01 | Payer: Medicare Other | Source: Ambulatory Visit

## 2017-08-04 ENCOUNTER — Ambulatory Visit: Payer: Medicare Other | Admitting: Internal Medicine

## 2017-08-04 ENCOUNTER — Encounter: Payer: Self-pay | Admitting: Internal Medicine

## 2017-08-04 VITALS — BP 143/73 | HR 43 | Temp 98.6°F | Ht 72.0 in | Wt 206.6 lb

## 2017-08-04 DIAGNOSIS — K648 Other hemorrhoids: Secondary | ICD-10-CM | POA: Diagnosis not present

## 2017-08-04 NOTE — Patient Instructions (Signed)
Avoid straining.  Continue Metamucil 1 dose daily  Limit toilet time to 5 minutes  Call with any interim problems  Schedule followup appointment in 6 months from now

## 2017-08-04 NOTE — Progress Notes (Signed)
[place] banding procedure note:  The patient presents with symptomatic grade 3 hemorrhoids,;  Status post banding of all 3 columns previously. Globally, much improved. A small tag on the left side which aggravates him from time to time. He was doing so much better he stopped taking his daily Metamucil. He desires a touchup if appropriate. All risks, benefits, and alternative forms of therapy were described and informed consent was obtained.  In the left lateral decubitus position, a DRE revealed a small left-sided external hemorrhoid tag easily reducible. Exam otherwise negative. I elected to go ahead and put 2 bands on the left lateral column. This was done without difficulty. Follow-up DRE revealed adequate position. No pinching or pain. (s).  The patient was discharged home without pain or other issues. Dietary and behavioral recommendations were given.  Daily Metamucil without fail strongly recommended.  Office follow-up in 6 months  No complications were encountered and the patient tolerated the procedure well.

## 2017-09-11 ENCOUNTER — Ambulatory Visit: Payer: Medicare Other | Admitting: Neurology

## 2017-09-11 ENCOUNTER — Encounter

## 2018-01-08 ENCOUNTER — Encounter: Payer: Self-pay | Admitting: Internal Medicine

## 2018-01-26 ENCOUNTER — Other Ambulatory Visit: Payer: Self-pay

## 2018-01-26 ENCOUNTER — Emergency Department (HOSPITAL_COMMUNITY): Payer: Medicare Other

## 2018-01-26 ENCOUNTER — Encounter (HOSPITAL_COMMUNITY): Payer: Self-pay

## 2018-01-26 ENCOUNTER — Emergency Department (HOSPITAL_COMMUNITY)
Admission: EM | Admit: 2018-01-26 | Discharge: 2018-01-26 | Disposition: A | Discharge status: Home / Self Care | Payer: Medicare Other | Attending: Emergency Medicine | Admitting: Emergency Medicine

## 2018-01-26 DIAGNOSIS — F17210 Nicotine dependence, cigarettes, uncomplicated: Secondary | ICD-10-CM | POA: Diagnosis not present

## 2018-01-26 DIAGNOSIS — M25511 Pain in right shoulder: Secondary | ICD-10-CM | POA: Diagnosis not present

## 2018-01-26 MED ORDER — LIDOCAINE 5 % EX PTCH: 1.0000 | MEDICATED_PATCH | CUTANEOUS | 0 refills | Status: DC

## 2018-01-26 MED ORDER — METHOCARBAMOL 500 MG PO TABS: 500.0000 mg | ORAL_TABLET | Freq: Three times a day (TID) | ORAL | 0 refills | Status: DC | PRN

## 2018-01-26 NOTE — ED Provider Notes (Signed)
[facility] EMERGENCY DEPARTMENT Provider Note   CSN: [id number] Arrival date & time: 01/26/18  7209     History   Chief Complaint Chief Complaint  Patient presents with  . Shoulder Pain    HPI [name] is a 57 y.o. male with a history of tobacco abuse, prior substance abuse, and CKD who presents to the emergency department with complaint of right shoulder pain that started this morning.  Patient states that he was stepping off of his deck onto the ground and slipped somewhat.  He did not have a fall to the ground, hit his head, or have LOC.  He states that he landed on his feet.  He relates that his right shoulder seemed to pop or have an abnormal sensation, however he did not hit this against anything.  He states he is having pain specifically to the right shoulder, 8 out of 10 in severity, worse with movement, no alleviating factors.  He has not taken medication prior to arrival.  Denies numbness, tingling, weakness, or any other areas of injury.  Denies change in color, swelling, or fevers.  HPI  Past Medical History:  Diagnosis Date  . Allergy   . Arthritis    neck and back  . Blood transfusion without reported diagnosis   . BPH (benign prostatic hyperplasia)   . Cancer [facility]) 2004   testicle  . Chronic kidney disease    kidney stones  . Left lumbar radiculopathy 06/12/2016  . Medical history non-contributory    Pt has scattered thoughts and uncertain of past medical history  . 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 Stones, urinary tract   . Substance abuse ([place])    prescribed oxydcodone- 10 years    Patient Active Problem List   Diagnosis Date Noted  . Pulmonary nodules 06/18/2017  . Rectal bleeding 07/09/2016  . Hemorrhoids 07/09/2016  . Chronic left-sided low back pain with left-sided sciatica 06/12/2016  . Left lumbar radiculopathy 06/12/2016  . Aortic atherosclerosis ([place]) 05/28/2016  . 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 Cervical disc disease  05/22/2016  . Kidney stones 05/22/2016  . Tobacco abuse 05/22/2016  . BPH (benign prostatic hyperplasia) 05/22/2016  . H/O: asbestos exposure 05/22/2016  . Substance abuse ([place]) 05/22/2016  . Personality disorder ([place]) 05/22/2016  . Influenza vaccine refused 05/22/2016  . Lumbar stenosis 07/24/2015  . CAROTID ARTERY STENOSIS 04/24/2010  . PVC (premature ventricular contraction) 02/25/2010    Past Surgical History:  Procedure Laterality Date  . BACK SURGERY     x5  . CERVICAL DISC SURGERY     x2  . COLONOSCOPY WITH PROPOFOL N/A 08/11/2016   Procedure: COLONOSCOPY WITH PROPOFOL;  Surgeon: [name], MD;  [facility];  Service: Endoscopy;  Laterality: N/A;  130   . POLYPECTOMY  08/11/2016   Procedure: POLYPECTOMY;  Surgeon: [name], MD;  Location: [facility];  Service: Endoscopy;;  colon  . testicular cancer  2004  . TONSILLECTOMY          Home Medications    Prior to Admission medications   Medication Sig Start Date End Date Taking? Authorizing Provider  OVER THE COUNTER MEDICATION Bayer Back and Body as needed    [provider]    Family History Family History  Problem Relation Age of Onset  . COPD Mother   . Heart disease Mother   . Other Father        Never knew his father.  . Thyroid disease  Sister   . Arthritis Sister   . Heart disease Sister        bypass  . Colon cancer Neg Hx     Social History Social History   Tobacco Use  . Smoking status: Current Every Day Smoker    Packs/day: 1.00    Years: 40.00    Pack years: 40.00    Types: Cigarettes    Start date: 03/31/1974  . Smokeless tobacco: Never Used  Substance Use Topics  . Alcohol use: Yes    Comment: Occasional  . Drug use: No    Types: Cocaine    Comment: Remote hx of cocaine, quit 1989     Allergies   Acetaminophen; Imodium [loperamide]; Morphine; Aleve [naproxen]; Cymbalta [duloxetine hcl]; Diclofenac sodium; Lyrica [pregabalin]; and Vioxx  [rofecoxib]   Review of Systems Review of Systems  Constitutional: Negative for chills and fever.  Respiratory: Negative for shortness of breath.   Cardiovascular: Negative for chest pain.  Musculoskeletal: Positive for arthralgias (R shoulder).  Skin: Negative for color change and wound.  Neurological: Negative for weakness and numbness.    Physical Exam Updated Vital Signs BP (!) 158/90   Pulse 71   Temp 98.1 F (36.7 C) (Temporal)   Resp 14   SpO2 100%   Physical Exam  Constitutional: He appears well-developed and well-nourished. No distress.  HENT:  Head: Normocephalic and atraumatic.  Eyes: Conjunctivae are normal. Right eye exhibits no discharge. Left eye exhibits no discharge.  Neck: Normal range of motion. No edema and no erythema present.  Musculoskeletal:  No obvious deformity, appreciable swelling, erythema, ecchymosis, open wounds, or increased warmth.  Patient has full active range of motion to bilateral wrists, elbows, and the left shoulder.  Right shoulder limitation in flexion, abduction, and scaption, he is able to get to about 45 degrees with each of these motions.  Limited secondary to pain per patient report.  He is tender to palpation over the R supraspinatus muscle.  No other significant areas of tenderness to the upper extremities.  No point/focal bony tenderness or palpable joint instability.  Neurological: He is alert.  Clear speech.  Sensation grossly intact bilateral upper extremities.  5 out of 5 symmetric grip strength.  Patient able to perform okay sign, thumbs up, and cross second and third digits bilaterally.  He is ambulatory in the ER.  Psychiatric: He has a normal mood and affect. His behavior is normal. Thought content normal.  Nursing note and vitals reviewed.    ED Treatments / Results  Labs (all labs ordered are listed, but only abnormal results are displayed) Labs Reviewed - No data to display  EKG None  Radiology Dg Shoulder  Right  Result Date: 01/26/2018 CLINICAL DATA:  Injury. EXAM: RIGHT SHOULDER - 2+ VIEW COMPARISON:  No prior. FINDINGS: No acute bony or joint abnormality identified. No evidence of fracture or dislocation. Acromioclavicular glenohumeral degenerative change. Calcific density noted over the right shoulder most likely related calcific supraspinatus tendinosis. IMPRESSION: 1. Mild acromioclavicular glenohumeral degenerative change. No acute bony abnormality. 2. Calcific density noted suggesting calcific supraspinatus tendinosis. Electronically Signed   By: Marcello Moores  Register   On: 01/26/2018 10:40    Procedures Procedures (including critical care time)  Medications Ordered in ED Medications - No data to display   Initial Impression / Assessment and Plan / ED Course  I have reviewed the triage vital signs and the nursing notes.  Pertinent labs & imaging results that were available during my care of  the patient were reviewed by me and considered in my medical decision making (see chart for details).    Patient presents to the ED with complaints of pain to the  R shoulder pain s/p popping sensation. No other areas notable for injury. No overlying erythema/warmth or fevers to raise concern for infectious etiology. Exam without obvious deformity or open wounds. ROM limited secondary to pain Tender to palpation over supraspinatus. NVI distally. Xray negative for fracture/dislocation. Query rotator cuff etiology. Robaxin and lidoderm patch prescriptions provided. I discussed results, treatment plan, need for follow-up, and return precautions with the patient. Provided opportunity for questions, patient confirmed understanding and are in agreement with plan.   Final Clinical Impressions(s) / ED Diagnoses   Final diagnoses:  Acute pain of right shoulder    ED Discharge Orders         Ordered    methocarbamol (ROBAXIN) 500 MG tablet  Every 8 hours PRN     01/26/18 1349    lidocaine (LIDODERM) 5 %   Every 24 hours     01/26/18 1349           Jemiah Ellen[place], [place], PA-C 01/26/18 1351    Milton Ferguson, MD 01/26/18 1458

## 2018-01-26 NOTE — ED Triage Notes (Signed)
Last night pt was building a deck on his home. Stepped off his deck and missed the bed of his truck causing him to fall and hurt his right shoulder. Pt is in NAD. Able to move shoulder with pain.

## 2018-01-26 NOTE — Discharge Instructions (Signed)
You were seen in the emergency department today for right shoulder pain.  Your x-ray was without or dislocation.  Suspect this may be a problem with your rotator cuff we are sending home with 2 medications to help with this: -Lidoderm patches: Please apply 1 patch to the right shoulder daily. -Robaxin is the muscle relaxer I have prescribed, this is meant to help with muscle tightness. Be aware that this medication may make you drowsy therefore the first time you take this it should be at a time you are in an environment where you can rest. Do not drive or operate heavy machinery when taking this medication. Do not drink alcohol or take other sedating medications with this medicine such as narcotics or benzodiazepines.   You make take Tylenol per over the counter dosing with these medications.   We have prescribed you new medication(s) today. Discuss the medications prescribed today with your pharmacist as they can have adverse effects and interactions with your other medicines including over the counter and prescribed medications. Seek medical evaluation if you start to experience new or abnormal symptoms after taking one of these medicines, seek care immediately if you start to experience difficulty breathing, feeling of your throat closing, facial swelling, or rash as these could be indications of a more serious allergic reaction  Please follow-up with the orthopedic specialist in your discharge instructions within 1 week.  Return to the ER for new or worsening symptoms or any other concerns.

## 2018-03-31 ENCOUNTER — Encounter (HOSPITAL_COMMUNITY): Payer: Self-pay

## 2018-03-31 ENCOUNTER — Emergency Department (HOSPITAL_COMMUNITY)
Admission: EM | Admit: 2018-03-31 | Discharge: 2018-03-31 | Disposition: A | Discharge status: Home / Self Care | Payer: Medicare Other | Attending: Emergency Medicine | Admitting: Emergency Medicine

## 2018-03-31 ENCOUNTER — Emergency Department (HOSPITAL_COMMUNITY): Payer: Medicare Other

## 2018-03-31 DIAGNOSIS — R202 Paresthesia of skin: Secondary | ICD-10-CM

## 2018-03-31 DIAGNOSIS — M5441 Lumbago with sciatica, right side: Secondary | ICD-10-CM | POA: Insufficient documentation

## 2018-03-31 DIAGNOSIS — R200 Anesthesia of skin: Secondary | ICD-10-CM | POA: Insufficient documentation

## 2018-03-31 DIAGNOSIS — F17210 Nicotine dependence, cigarettes, uncomplicated: Secondary | ICD-10-CM | POA: Diagnosis not present

## 2018-03-31 DIAGNOSIS — M545 Low back pain: Secondary | ICD-10-CM | POA: Diagnosis present

## 2018-03-31 MED ORDER — PREDNISONE 20 MG PO TABS: 40.0000 mg | ORAL_TABLET | Freq: Every day | ORAL | 0 refills | Status: AC

## 2018-03-31 MED ORDER — LIDOCAINE 5 % EX PTCH: 1.0000 | MEDICATED_PATCH | CUTANEOUS | 0 refills | Status: DC

## 2018-03-31 MED ORDER — METHOCARBAMOL 500 MG PO TABS: 500.0000 mg | ORAL_TABLET | Freq: Two times a day (BID) | ORAL | 0 refills | Status: DC

## 2018-03-31 NOTE — ED Provider Notes (Signed)
[place] EMERGENCY DEPARTMENT Provider Note   CSN: [id number] Arrival date & time: 03/31/18  4580     History   Chief Complaint Chief Complaint  Patient presents with  . Fall    HPI [name] is a 58 y.o. male past medical history of BPH, CKD, left lumbar radiculopathy presents for evaluation of right-sided back pain that began after a mechanical fall.  He states that on 12/11, he was on the porch and working with car and states that he fell and hit the right side of his back on a tailgate.  He states he did not hit the ground.  He also reports that at that time, his arm got twisted and pulled his back.  He reports since then, he has had continued back pain.  He has been taking Bayer back pain for pain relief with minimal improvement.  He states that the pain radiates down the posterior aspect of his right leg.  Patient also reports he has had some numbness noted to the right foot.  He does have a history of back surgery and states that he has seen [place] [facility].  His last appointment that was in 2017.  Patient states he still been able to ambulate without any difficulty.  Patient denies any history of IV drug use, fevers, urinary or bowel incontinence, saddle anesthesia, urinary complaints, vomiting.     The history is provided by the patient.    Past Medical History:  Diagnosis Date  . Allergy   . Arthritis    neck and back  . Blood transfusion without reported diagnosis   . BPH (benign prostatic hyperplasia)   . Cancer [facility]) 2004   testicle  . Chronic kidney disease    kidney stones  . Left lumbar radiculopathy 06/12/2016  . Medical history non-contributory    Pt has scattered thoughts and uncertain of past medical history  . 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 Stones, urinary tract   . Substance abuse ([place])    prescribed oxydcodone- 10 years    Patient Active Problem List   Diagnosis Date Noted  . Pulmonary nodules 06/18/2017  .  Rectal bleeding 07/09/2016  . Hemorrhoids 07/09/2016  . Chronic left-sided low back pain with left-sided sciatica 06/12/2016  . Left lumbar radiculopathy 06/12/2016  . Aortic atherosclerosis ([place]) 05/28/2016  . Panlobular emphysema ([employer]) 05/28/2016  . Cervical disc disease 05/22/2016  . Kidney stones 05/22/2016  . Tobacco abuse 05/22/2016  . BPH (benign prostatic hyperplasia) 05/22/2016  . H/O: asbestos exposure 05/22/2016  . Substance abuse ([place]) 05/22/2016  . Personality disorder ([place]) 05/22/2016  . Influenza vaccine refused 05/22/2016  . Lumbar stenosis 07/24/2015  . CAROTID ARTERY STENOSIS 04/24/2010  . PVC (premature ventricular contraction) 02/25/2010    Past Surgical History:  Procedure Laterality Date  . BACK SURGERY     x5  . CERVICAL DISC SURGERY     x2  . COLONOSCOPY WITH PROPOFOL N/A 08/11/2016   Procedure: COLONOSCOPY WITH PROPOFOL;  Surgeon: [name], MD;  [facility];  Service: Endoscopy;  Laterality: N/A;  130   . POLYPECTOMY  08/11/2016   Procedure: POLYPECTOMY;  Surgeon: [name], MD;  Location[facility];  Service: Endoscopy;;  colon  . testicular cancer  2004  . TONSILLECTOMY          Home Medications    Prior to Admission medications   Medication Sig Start Date End Date Taking? Authorizing Provider  lidocaine (LIDODERM) 5 %  Place 1 patch onto the skin daily. Remove & Discard patch within 12 hours or as directed by MD 03/31/18   Volanda Napoleon, PA-C  methocarbamol (ROBAXIN) 500 MG tablet Take 1 tablet (500 mg total) by mouth 2 (two) times daily. 03/31/18   Volanda Napoleon, PA-C  OVER THE COUNTER MEDICATION Bayer Back and Body as needed    [provider]  predniSONE (DELTASONE) 20 MG tablet Take 2 tablets (40 mg total) by mouth daily for 4 days. 03/31/18 04/04/18  Volanda Napoleon, PA-C    Family History Family History  Problem Relation Age of Onset  . COPD Mother   . Heart disease Mother   . Other Father         Never knew his father.  . Thyroid disease Sister   . Arthritis Sister   . Heart disease Sister        bypass  . Colon cancer Neg Hx     Social History Social History   Tobacco Use  . Smoking status: Current Every Day Smoker    Packs/day: 1.00    Years: 40.00    Pack years: 40.00    Types: Cigarettes    Start date: 03/31/1974  . Smokeless tobacco: Never Used  Substance Use Topics  . Alcohol use: Yes    Comment: Occasional  . Drug use: No    Types: Cocaine    Comment: Remote hx of cocaine, quit 1989     Allergies   Acetaminophen; Imodium [loperamide]; Morphine; Aleve [naproxen]; Cymbalta [duloxetine hcl]; Diclofenac sodium; Lyrica [pregabalin]; and Vioxx [rofecoxib]   Review of Systems Review of Systems  Constitutional: Negative for fever.  Gastrointestinal: Negative for abdominal pain.  Musculoskeletal: Positive for back pain. Negative for neck pain.  Neurological: Positive for numbness. Negative for weakness.  All other systems reviewed and are negative.    Physical Exam Updated Vital Signs BP (!) 153/72   Pulse 86   Temp 97.6 F (36.4 C)   Resp 18   SpO2 99%   Physical Exam Vitals signs and nursing note reviewed.  Constitutional:      Appearance: He is well-developed.  HENT:     Head: Normocephalic and atraumatic.  Eyes:     General: No scleral icterus.       Right eye: No discharge.        Left eye: No discharge.     Conjunctiva/sclera: Conjunctivae normal.  Neck:     Musculoskeletal: Full passive range of motion without pain.     Comments: Full flexion/extension and lateral movement of neck fully intact. No bony midline tenderness. No deformities or crepitus.  Cardiovascular:     Pulses:          Dorsalis pedis pulses are 2+ on the right side and 2+ on the left side.  Pulmonary:     Effort: Pulmonary effort is normal.  Musculoskeletal:     Cervical back: He exhibits no tenderness.     Thoracic back: He exhibits no tenderness.     Lumbar  back: He exhibits tenderness.       Back:     Comments: No midline T-spine tenderness.  No deformity or crepitus noted.  Tenderness palpation midline lumbar spine that extends into the paraspinal muscles of the right region.  Pain reproduced by palpation.   Skin:    General: Skin is warm and dry.  Neurological:     Mental Status: He is alert.     Deep Tendon Reflexes:  Reflex Scores:      Patellar reflexes are 2+ on the right side and 2+ on the left side.    Comments: Follows commands, Moves all extremities  5/5 strength to BUE and BLE  Subjective decreased sensation noted to right foot that begins at the dorsal aspect and extends up to about the mid leg.  Patient does respond to painful stimuli. Dorsiflexion/plantarflexion of right foot intact any difficulty. Normal gait. Positive SLR on the right  Psychiatric:        Speech: Speech normal.        Behavior: Behavior normal.      ED Treatments / Results  Labs (all labs ordered are listed, but only abnormal results are displayed) Labs Reviewed - No data to display  EKG None  Radiology Dg Lumbar Spine Complete  Result Date: 03/31/2018 CLINICAL DATA:  Pt here with c/o fall 2 days after thanksgiving and c/o general body aches , not c/o anything specific today EXAM: LUMBAR SPINE - COMPLETE 4+ VIEW COMPARISON:  CT 01/17/2015 FINDINGS: Posterior spinal fusion hardware with bilateral pedicle screws L1-L4, intact without surrounding lucency. Graft with markers in the L2-3 and L3-4 interspaces. Narrowing L4-5 with vacuum phenomenon. No fracture or dislocation. Patchy aortic calcifications without suggestion of aneurysm. IMPRESSION: 1. Negative for fracture or other acute bone abnormality. 2. Postoperative and degenerative changes as above. Electronically Signed   By: Lucrezia Europe M.D.   On: 03/31/2018 11:53    Procedures Procedures (including critical care time)  Medications Ordered in ED Medications - No data to display   Initial  Impression / Assessment and Plan / ED Course  I have reviewed the triage vital signs and the nursing notes.  Pertinent labs & imaging results that were available during my care of the patient were reviewed by me and considered in my medical decision making (see chart for details).     58 year old who presents for evaluation of back pain is been ongoing since a mechanical fall that occurred earlier in the month.  He reports that he hit the back on a tailgate of a car.  He also reports that he swung around on a porch and states that that strained his muscles.  Has had some pain that radiates down to the right lower extremity.  Additionally has some numbness in the right foot.  No urinary or bowel incontinence.  No saddle anesthesia.  Consider sciatica versus mechanical back pain versus radiculopathy pain.  History/physical exam not concerning for cauda equina, spinal abscess.  History/physical exam is not concerning for aortic dissection, septic arthritis, acute arterial embolism.  Additionally, history/physical exam not concerning for CVA.  Given her history of trauma, will plan for x-ray evaluation.    Reviewed.  Negative for any acute bony abnormality.  Does mention degenerative changes.  Discussed results with patient.  Has been able to ambulate in the department any difficulty.  He does have some numbness noted to the right foot but exhibits no sign of weakness on neuro exam.  At this time, no indication for acute MRI.  I discussed with patient that he will likely need follow-up with neurosurgeon given his history of back surgery and numbness.  He has seen [place] [facility] previously.  Will give outpatient referral.  Additionally, discussed with him at home supportive care measures. At this time, patient exhibits no emergent life-threatening condition that require further evaluation in ED or admission. Patient had ample opportunity for questions and discussion. All patient's questions were  answered with  full understanding. Strict return precautions discussed. Patient expresses understanding and agreement to plan.    Final Clinical Impressions(s) / ED Diagnoses   Final diagnoses:  Acute right-sided low back pain with right-sided sciatica  Paresthesia    ED Discharge Orders         Ordered    methocarbamol (ROBAXIN) 500 MG tablet  2 times daily     03/31/18 1212    lidocaine (LIDODERM) 5 %  Every 24 hours     03/31/18 1212    predniSONE (DELTASONE) 20 MG tablet  Daily     03/31/18 1212           Desma Mcgregor 03/31/18 1341    Mesner, Corene Cornea, MD 04/01/18 1814

## 2018-03-31 NOTE — ED Notes (Signed)
Patient transported to X-ray 

## 2018-03-31 NOTE — ED Notes (Signed)
Patient verbalizes understanding of discharge instructions. Opportunity for questioning and answers were provided. Armband removed by staff, pt discharged from ED ambulatory.   

## 2018-03-31 NOTE — Discharge Instructions (Signed)
Use lidocaine patch as directed.  Take Robaxin as prescribed. This medication will make you drowsy so do not drive or drink alcohol when taking it.  Use prednisone as directed.  As we discussed, you need to follow-up with the referred neurosurgeon for further evaluation of your symptoms.  Return to the Emergency Department immediately for any worsening back pain, neck pain, difficulty walking, numbness/weaknss of your arms or legs, urinary or bowel accidents, fever or any other worsening or concerning symptoms.

## 2018-03-31 NOTE — ED Triage Notes (Signed)
Pt here with c/o fall 2 days after thanksgiving and c/o general body aches , not c/o anything specific today

## 2018-04-15 ENCOUNTER — Emergency Department (HOSPITAL_COMMUNITY)
Admission: EM | Admit: 2018-04-15 | Discharge: 2018-04-15 | Discharge status: Against Medical Advice | Payer: Medicare Other | Attending: Emergency Medicine | Admitting: Emergency Medicine

## 2018-04-15 ENCOUNTER — Encounter (HOSPITAL_COMMUNITY): Payer: Self-pay | Admitting: Emergency Medicine

## 2018-04-15 ENCOUNTER — Other Ambulatory Visit: Payer: Self-pay

## 2018-04-15 DIAGNOSIS — Z5329 Procedure and treatment not carried out because of patient's decision for other reasons: Secondary | ICD-10-CM | POA: Diagnosis not present

## 2018-04-15 DIAGNOSIS — G8929 Other chronic pain: Secondary | ICD-10-CM

## 2018-04-15 DIAGNOSIS — F17210 Nicotine dependence, cigarettes, uncomplicated: Secondary | ICD-10-CM | POA: Diagnosis not present

## 2018-04-15 DIAGNOSIS — Z79899 Other long term (current) drug therapy: Secondary | ICD-10-CM | POA: Diagnosis not present

## 2018-04-15 DIAGNOSIS — M545 Low back pain: Secondary | ICD-10-CM | POA: Diagnosis present

## 2018-04-15 NOTE — ED Triage Notes (Signed)
Pt c/o lower right back down to calf area since 03/11/18 after a fall, pt also reports right shoulder pain after another fall 03/20/18, pt reports he has been seen 3 times for same complaint and was referred to Dr. Aline Brochure, reports he has not followed up with him because "I don't have much faith in him"

## 2018-04-15 NOTE — ED Notes (Signed)
MD was in room to assess patient, pt was displeased with care and started to be disrespectful to MD.  Pt states " that's not how you listen to a heart", " I should have went to [facility], I guess we are done here"  Pt stood up and placed his hand in MD's face and walked out of room.  No VS obtained or esig completed.

## 2018-04-15 NOTE — ED Provider Notes (Signed)
[facility] EMERGENCY DEPARTMENT Provider Note   CSN: [id number] Arrival date & time: 04/15/18  6553     History   Chief Complaint Chief Complaint  Patient presents with  . Back Pain    HPI [name] is a 58 y.o. male.  HPI  Pt was seen at [place]. Per pt, c/o gradual onset and persistence of constant chronic right low back "pain" for the past several months.  Denies any change in his pain pattern.  Pain worsens with palpation of the area and body position changes. Pt states he has not f/u with Ortho MD "because he don't have much faith in him." Denies incont/retention of bowel or bladder, no saddle anesthesia, no focal motor weakness, no tingling/numbness in extremities, no fevers, no new injury, no abd pain.   The symptoms have been associated with no other complaints. The patient has a significant history of similar symptoms previously, recently being evaluated for this complaint and multiple prior evals for same.    Past Medical History:  Diagnosis Date  . Allergy   . Arthritis    neck and back  . Blood transfusion without reported diagnosis   . BPH (benign prostatic hyperplasia)   . Cancer [facility]) 2004   testicle  . Chronic kidney disease    kidney stones  . Left lumbar radiculopathy 06/12/2016  . Medical history non-contributory    Pt has scattered thoughts and uncertain of past medical history  . 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 Stones, urinary tract   . Substance abuse ([place])    prescribed oxydcodone- 10 years    Patient Active Problem List   Diagnosis Date Noted  . Pulmonary nodules 06/18/2017  . Rectal bleeding 07/09/2016  . Hemorrhoids 07/09/2016  . Chronic left-sided low back pain with left-sided sciatica 06/12/2016  . Left lumbar radiculopathy 06/12/2016  . Aortic atherosclerosis ([place]) 05/28/2016  . 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 Cervical disc disease 05/22/2016  . Kidney stones 05/22/2016  . Tobacco abuse 05/22/2016  . BPH (benign  prostatic hyperplasia) 05/22/2016  . H/O: asbestos exposure 05/22/2016  . Substance abuse ([employer]) 05/22/2016  . Personality disorder ([place]) 05/22/2016  . Influenza vaccine refused 05/22/2016  . Lumbar stenosis 07/24/2015  . CAROTID ARTERY STENOSIS 04/24/2010  . PVC (premature ventricular contraction) 02/25/2010    Past Surgical History:  Procedure Laterality Date  . BACK SURGERY     x5  . CERVICAL DISC SURGERY     x2  . COLONOSCOPY WITH PROPOFOL N/A 08/11/2016   Procedure: COLONOSCOPY WITH PROPOFOL;  Surgeon: [name], MD;  [facility];  Service: Endoscopy;  Laterality: N/A;  130   . POLYPECTOMY  08/11/2016   Procedure: POLYPECTOMY;  Surgeon: [name], MD;  Location[facility];  Service: Endoscopy;;  colon  . testicular cancer  2004  . TONSILLECTOMY          Home Medications    Prior to Admission medications   Medication Sig Start Date End Date Taking? Authorizing Provider  lidocaine (LIDODERM) 5 % Place 1 patch onto the skin Rowland. Remove & Discard patch within 12 hours or as directed by MD 03/31/18   Volanda Napoleon, PA-C  methocarbamol (ROBAXIN) 500 MG tablet Take 1 tablet (500 mg total) by mouth 2 (two) times Rowland. 03/31/18   Volanda Napoleon, PA-C  OVER THE COUNTER MEDICATION [facility] Back and Body as needed    [provider]    Family History Family History  Problem  Relation Age of Onset  . COPD Mother   . Heart disease Mother   . Other Father        Never knew his father.  . Thyroid disease Sister   . Arthritis Sister   . Heart disease Sister        bypass  . Colon cancer Neg Hx     Social History Social History   Tobacco Use  . Smoking status: Current Every Day Smoker    Packs/day: 1.00    Years: 40.00    Pack years: 40.00    Types: Cigarettes    Start date: 03/31/1974  . Smokeless tobacco: Never Used  Substance Use Topics  . Alcohol use: Yes    Comment: Occasional  . Drug use: No    Types: Cocaine    Comment:  Remote hx of cocaine, quit 1989     Allergies   Acetaminophen; Imodium [loperamide]; Morphine; Aleve [naproxen]; Cymbalta [duloxetine hcl]; Diclofenac sodium; Lyrica [pregabalin]; and Vioxx [rofecoxib]   Review of Systems Review of Systems ROS: Statement: All systems negative except as marked or noted in the HPI; Constitutional: Negative for fever and chills. ; ; Eyes: Negative for eye pain, redness and discharge. ; ; ENMT: Negative for ear pain, hoarseness, nasal congestion, sinus pressure and sore throat. ; ; Cardiovascular: Negative for chest pain, palpitations, diaphoresis, dyspnea and peripheral edema. ; ; Respiratory: Negative for cough, wheezing and stridor. ; ; Gastrointestinal: Negative for nausea, vomiting, diarrhea, abdominal pain, blood in stool, hematemesis, jaundice and rectal bleeding. . ; ; Genitourinary: Negative for dysuria, flank pain and hematuria. ; ; Musculoskeletal: +chronic back pain. Negative for neck pain. Negative for swelling and new trauma.; ; Skin: Negative for pruritus, rash, abrasions, blisters, bruising and skin lesion.; ; Neuro: Negative for headache, lightheadedness and neck stiffness. Negative for weakness, altered level of consciousness, altered mental status, extremity weakness, paresthesias, involuntary movement, seizure and syncope.       Physical Exam Updated Vital Signs BP (!) 165/89 (BP Location: Right Arm)   Pulse 82   Temp 98.6 F (37 C) (Oral)   Resp 18   Ht 6' (1.829 m)   Wt 93 kg   SpO2 98%   BMI 27.80 kg/m   Physical Exam 0720: Physical examination:  Nursing notes reviewed; Vital signs and O2 SAT reviewed;  Constitutional: Well developed, Well nourished, Well hydrated, In no acute distress; Head:  Normocephalic, atraumatic; Eyes: EOMI, PERRL, No scleral icterus; ENMT: Mouth and pharynx normal, Mucous membranes moist; Neck: Supple, Full range of motion; Cardiovascular: Regular rate and rhythm; Respiratory: Breath sounds clear, No wheezes.   Speaking full sentences with ease, Normal respiratory effort/excursion; Chest: No deformity, Movement normal; Abdomen: Nondistended; Extremities: No deformity.; Neuro: AA&Ox3, No facial droop. Speech clear. No apparent gross focal motor deficits in extremities. Climbs on and off stretcher easily by himself. Gait steady with cane.; Skin: Color normal, Warm, Dry.; Psych:  Easily agitated and argumentative.    ED Treatments / Results  Labs (all labs ordered are listed, but only abnormal results are displayed)   EKG None  Radiology   Procedures Procedures (including critical care time)  Medications Ordered in ED Medications - No data to display   Initial Impression / Assessment and Plan / ED Course  I have reviewed the triage vital signs and the nursing notes.  Pertinent labs & imaging results that were available during my care of the patient were reviewed by me and considered in my medical decision making (see  chart for details).  MDM Reviewed: previous chart, nursing note and vitals Reviewed previous: x-ray and CT scan    0720:  Pt agitated as soon as the Agricultural consultant and I entered his exam room. Pt immediately started complaining of "getting an answering machine" when he called Ortho for f/u, then "got someone with a smartass attitude that said I didn't need to call again and if I did they weren't going to call back." Charge RN and I apologized for his experience, but re-iterated that was outside our scope in the ED, and that I would be happy to refer him to a different Ortho MD or Neurosurgeon MD if he wanted. As I spoke with him regarding his HPI, and started my physical exam (heart/lungs), pt then stood up and said he was "going to [facility]" and "at least they did an xray." I explained that I was just starting my physical exam as we were continuing to talk about his HPI, and have not discussed further dx testing yet. Pt then walked up to me into my personal space, stating that "that's not  how you listen to someone's heart," as he raised the back of his hand quickly towards my face. I ran out of the exam room for my safety and told pt he would be leaving now, and told ED RN to get Security prn. Pt apparently left the ED on his own, NAD, stable gait.      Final Clinical Impressions(s) / ED Diagnoses   Final diagnoses:  Other chronic pain    ED Discharge Orders    None       Francine Graven, DO 04/17/18 3300

## 2018-04-30 ENCOUNTER — Other Ambulatory Visit: Payer: Self-pay | Admitting: Orthopedic Surgery

## 2018-04-30 DIAGNOSIS — M5416 Radiculopathy, lumbar region: Secondary | ICD-10-CM

## 2018-05-06 ENCOUNTER — Ambulatory Visit
Admission: RE | Admit: 2018-05-06 | Discharge: 2018-05-06 | Disposition: A | Discharge status: Home / Self Care | Payer: Medicare Other | Source: Ambulatory Visit | Attending: Orthopedic Surgery | Admitting: Orthopedic Surgery

## 2018-05-06 DIAGNOSIS — M5416 Radiculopathy, lumbar region: Secondary | ICD-10-CM

## 2018-05-25 ENCOUNTER — Other Ambulatory Visit: Payer: Self-pay | Admitting: Neurological Surgery

## 2018-05-25 DIAGNOSIS — M5416 Radiculopathy, lumbar region: Secondary | ICD-10-CM

## 2018-05-27 ENCOUNTER — Ambulatory Visit
Admission: RE | Admit: 2018-05-27 | Discharge: 2018-05-27 | Disposition: A | Discharge status: Home / Self Care | Payer: Medicare Other | Source: Ambulatory Visit | Attending: Neurological Surgery | Admitting: Neurological Surgery

## 2018-05-27 DIAGNOSIS — M5416 Radiculopathy, lumbar region: Secondary | ICD-10-CM

## 2018-05-27 MED ORDER — IOPAMIDOL (ISOVUE-M 200) INJECTION 41%
1.0000 mL | Freq: Once | INTRAMUSCULAR | Status: AC
  Administered 2018-05-27: 1 mL via EPIDURAL

## 2018-05-27 MED ORDER — METHYLPREDNISOLONE ACETATE 40 MG/ML INJ SUSP (RADIOLOG
120.0000 mg | Freq: Once | INTRAMUSCULAR | Status: AC
  Administered 2018-05-27: 120 mg via EPIDURAL

## 2018-05-27 NOTE — Discharge Instructions (Signed)

## 2018-05-29 ENCOUNTER — Emergency Department (HOSPITAL_COMMUNITY)
Admission: EM | Admit: 2018-05-29 | Discharge: 2018-05-29 | Disposition: A | Discharge status: Home / Self Care | Payer: Medicare Other | Attending: Emergency Medicine | Admitting: Emergency Medicine

## 2018-05-29 ENCOUNTER — Other Ambulatory Visit: Payer: Self-pay

## 2018-05-29 ENCOUNTER — Encounter (HOSPITAL_COMMUNITY): Payer: Self-pay | Admitting: Emergency Medicine

## 2018-05-29 DIAGNOSIS — Z8547 Personal history of malignant neoplasm of testis: Secondary | ICD-10-CM | POA: Diagnosis not present

## 2018-05-29 DIAGNOSIS — M545 Low back pain: Secondary | ICD-10-CM | POA: Diagnosis present

## 2018-05-29 DIAGNOSIS — I2510 Atherosclerotic heart disease of native coronary artery without angina pectoris: Secondary | ICD-10-CM | POA: Insufficient documentation

## 2018-05-29 DIAGNOSIS — Z940 Kidney transplant status: Secondary | ICD-10-CM | POA: Insufficient documentation

## 2018-05-29 DIAGNOSIS — F17210 Nicotine dependence, cigarettes, uncomplicated: Secondary | ICD-10-CM | POA: Diagnosis not present

## 2018-05-29 DIAGNOSIS — G8929 Other chronic pain: Secondary | ICD-10-CM | POA: Diagnosis not present

## 2018-05-29 DIAGNOSIS — Z79899 Other long term (current) drug therapy: Secondary | ICD-10-CM | POA: Insufficient documentation

## 2018-05-29 DIAGNOSIS — M5441 Lumbago with sciatica, right side: Secondary | ICD-10-CM | POA: Diagnosis not present

## 2018-05-29 DIAGNOSIS — N189 Chronic kidney disease, unspecified: Secondary | ICD-10-CM | POA: Diagnosis not present

## 2018-05-29 MED ORDER — TRAMADOL HCL 50 MG PO TABS: 50.0000 mg | ORAL_TABLET | Freq: Four times a day (QID) | ORAL | 0 refills | Status: DC | PRN

## 2018-05-29 NOTE — ED Triage Notes (Signed)
Pt reports lower back since fall in dec. Reports 10/10.

## 2018-05-29 NOTE — ED Provider Notes (Signed)
[facility] EMERGENCY DEPARTMENT Provider Note   CSN: [id number] Arrival date & time: 05/29/18  1745    History   Chief Complaint Chief Complaint  Patient presents with  . Back Pain    HPI [name] is a 58 y.o. male.     Patient is a 58 year old male who presents with back pain.  He has a history of chronic back pain since 2018 he states.  He has pain in his right lower back that radiates down his right leg.  He has some numbness in his right foot which is also been going on for a long period of time.  He denies any recent injuries.  No increased weakness of the legs.  No loss of bowel or bladder function.  He is followed by [place] [facility].  He had a recent MRI about 2 weeks ago of his back and he states that he had an injection of his back 2 days ago.  He states his pain is worse since then and he needs something for pain.  He denies any fevers.  No vomiting.  No other change in symptoms.     Past Medical History:  Diagnosis Date  . Allergy   . Arthritis    neck and back  . Blood transfusion without reported diagnosis   . BPH (benign prostatic hyperplasia)   . Cancer [facility]) 2004   testicle  . Chronic kidney disease    kidney stones  . Left lumbar radiculopathy 06/12/2016  . Medical history non-contributory    Pt has scattered thoughts and uncertain of past medical history  . 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 Stones, urinary tract   . Substance abuse ([place])    prescribed oxydcodone- 10 years    Patient Active Problem List   Diagnosis Date Noted  . Pulmonary nodules 06/18/2017  . Rectal bleeding 07/09/2016  . Hemorrhoids 07/09/2016  . Chronic left-sided low back pain with left-sided sciatica 06/12/2016  . Left lumbar radiculopathy 06/12/2016  . Aortic atherosclerosis ([place]) 05/28/2016  . 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 Cervical disc disease 05/22/2016  . Kidney stones 05/22/2016  . Tobacco abuse 05/22/2016  . BPH  (benign prostatic hyperplasia) 05/22/2016  . H/O: asbestos exposure 05/22/2016  . Substance abuse ([place]) 05/22/2016  . Personality disorder ([place]) 05/22/2016  . Influenza vaccine refused 05/22/2016  . Lumbar stenosis 07/24/2015  . CAROTID ARTERY STENOSIS 04/24/2010  . PVC (premature ventricular contraction) 02/25/2010    Past Surgical History:  Procedure Laterality Date  . BACK SURGERY     x5  . CERVICAL DISC SURGERY     x2  . COLONOSCOPY WITH PROPOFOL N/A 08/11/2016   Procedure: COLONOSCOPY WITH PROPOFOL;  Surgeon: [name], MD;  [facility];  Service: Endoscopy;  Laterality: N/A;  130   . POLYPECTOMY  08/11/2016   Procedure: POLYPECTOMY;  Surgeon: [name], MD;  Location[facility];  Service: Endoscopy;;  colon  . testicular cancer  2004  . TONSILLECTOMY          Home Medications    Prior to Admission medications   Medication Sig Start Date End Date Taking? Authorizing Provider  cyclobenzaprine (FLEXERIL) 10 MG tablet  04/19/18   [provider]  lidocaine (LIDODERM) 5 % Place 1 patch onto the skin daily. Remove & Discard patch within 12 hours or as directed by MD 03/31/18   Volanda Napoleon, PA-C  methocarbamol (ROBAXIN) 500 MG tablet Take 1 tablet (500 mg  total) by mouth 2 (two) times daily. 03/31/18   Volanda Napoleon, PA-C  OVER THE COUNTER MEDICATION [facility] Back and Body as needed    [provider]  traMADol (ULTRAM) 50 MG tablet Take 1 tablet (50 mg total) by mouth every 6 (six) hours as needed. 05/29/18   Malvin Johns, MD    Family History Family History  Problem Relation Age of Onset  . COPD Mother   . Heart disease Mother   . Other Father        Never knew his father.  . Thyroid disease Sister   . Arthritis Sister   . Heart disease Sister        bypass  . Colon cancer Neg Hx     Social History Social History   Tobacco Use  . Smoking status: Current Every Day Smoker    Packs/day: 1.00    Years: 40.00     Pack years: 40.00    Types: Cigarettes    Start date: 03/31/1974  . Smokeless tobacco: Never Used  Substance Use Topics  . Alcohol use: Yes    Comment: Occasional  . Drug use: No    Types: Cocaine    Comment: Remote hx of cocaine, quit 1989     Allergies   Acetaminophen; Cymbalta [duloxetine hcl]; Diclofenac sodium; Imodium [loperamide]; Lyrica [pregabalin]; Morphine; Vioxx [rofecoxib]; and Aleve [naproxen]   Review of Systems Review of Systems  Constitutional: Negative for chills, diaphoresis, fatigue and fever.  HENT: Negative for congestion, rhinorrhea and sneezing.   Eyes: Negative.   Respiratory: Negative for cough, chest tightness and shortness of breath.   Cardiovascular: Negative for chest pain and leg swelling.  Gastrointestinal: Negative for abdominal pain, blood in stool, diarrhea, nausea and vomiting.  Genitourinary: Negative for difficulty urinating, flank pain, frequency and hematuria.  Musculoskeletal: Positive for back pain. Negative for arthralgias.  Skin: Negative for rash.  Neurological: Positive for numbness. Negative for dizziness, speech difficulty, weakness and headaches.     Physical Exam Updated Vital Signs BP (!) 143/80 (BP Location: Right Arm)   Pulse 93   Temp 98.1 F (36.7 C) (Oral)   Resp 18   Ht 6' (1.829 m)   Wt 79.4 kg   SpO2 100%   BMI 23.73 kg/m   Physical Exam Constitutional:      Appearance: He is well-developed.  HENT:     Head: Normocephalic and atraumatic.  Eyes:     Pupils: Pupils are equal, round, and reactive to light.  Neck:     Musculoskeletal: Normal range of motion and neck supple.  Cardiovascular:     Rate and Rhythm: Normal rate and regular rhythm.     Heart sounds: Normal heart sounds.  Pulmonary:     Effort: Pulmonary effort is normal. No respiratory distress.     Breath sounds: Normal breath sounds. No wheezing or rales.  Chest:     Chest wall: No tenderness.  Abdominal:     General: Bowel sounds are  normal.     Palpations: Abdomen is soft.     Tenderness: There is no abdominal tenderness. There is no guarding or rebound.  Musculoskeletal: Normal range of motion.     Comments: Patient has tenderness to his right lower back.  Positive straight leg raise on the right.  He has normal sensation to light touch and motor function in both lower extremities.  Patellar reflexes symmetric.  Lymphadenopathy:     Cervical: No cervical adenopathy.  Skin:  General: Skin is warm and dry.     Findings: No rash.  Neurological:     Mental Status: He is alert and oriented to person, place, and time.      ED Treatments / Results  Labs (all labs ordered are listed, but only abnormal results are displayed) Labs Reviewed - No data to display  EKG None  Radiology No results found.  Procedures Procedures (including critical care time)  Medications Ordered in ED Medications - No data to display   Initial Impression / Assessment and Plan / ED Course  I have reviewed the triage vital signs and the nursing notes.  Pertinent labs & imaging results that were available during my care of the patient were reviewed by me and considered in my medical decision making (see chart for details).        Patient has an exacerbation of his chronic back pain.  He denies any new injuries.  At this point I do not feel that imaging is indicated.  He does not have any suggestions of cauda equina.  He was given a short-term prescription for tramadol for pain.  He has been using over-the-counter medicines and Lidoderm patches with no improvement in symptoms.  I did review his MRI findings and there is some concern for myeloproliferative disorder versus infection versus metastatic disease.  I asked the patient and the family if the neurosurgeon was doing further evaluation of this.  Patient initially said no but then the family was aware that he had abnormal findings on the MRI so it does sound like this was discussed  with the patient.  The patient states that he has some further testing including a chest x-ray that is pending.  I did encourage the patient to follow back up with a neurosurgeon and make sure that this is been further evaluated.  He currently does not have a primary care provider as he said his PCP from [place] moved to [place].  I asked him about following up with the same office that she was in previously or following her to [facility] and the patient does not have a clear answer as to why he is not doing this.  He does not seem to be happy with any of his prior providers.  He states that he has been calling around but no one's been able to get him in for an appointment although patient is very vague about this.  I encouraged him to follow-up with a primary care provider and discuss his abnormal MRI findings further with his neurosurgeon.  Final Clinical Impressions(s) / ED Diagnoses   Final diagnoses:  Chronic low back pain with right-sided sciatica, unspecified back pain laterality    ED Discharge Orders         Ordered    traMADol (ULTRAM) 50 MG tablet  Every 6 hours PRN     05/29/18 1850           Malvin Johns, MD 05/29/18 2051

## 2018-05-29 NOTE — ED Notes (Signed)
"  Patient verbalizes understanding of discharge instructions. Opportunity for questioning and answers were provided. , Pt discharged from ED ."

## 2018-06-12 ENCOUNTER — Encounter (HOSPITAL_COMMUNITY): Payer: Self-pay | Admitting: Emergency Medicine

## 2018-06-12 ENCOUNTER — Inpatient Hospital Stay (HOSPITAL_COMMUNITY)
Admission: EM | Admit: 2018-06-12 | Discharge: 2018-06-23 | DRG: 682 | Disposition: A | Discharge status: Home / Self Care | Payer: Medicare Other | Attending: Internal Medicine | Admitting: Internal Medicine

## 2018-06-12 ENCOUNTER — Other Ambulatory Visit: Payer: Self-pay

## 2018-06-12 ENCOUNTER — Emergency Department (HOSPITAL_COMMUNITY): Payer: Medicare Other

## 2018-06-12 DIAGNOSIS — J189 Pneumonia, unspecified organism: Secondary | ICD-10-CM | POA: Diagnosis present

## 2018-06-12 DIAGNOSIS — Z87442 Personal history of urinary calculi: Secondary | ICD-10-CM

## 2018-06-12 DIAGNOSIS — F1910 Other psychoactive substance abuse, uncomplicated: Secondary | ICD-10-CM | POA: Diagnosis not present

## 2018-06-12 DIAGNOSIS — R1013 Epigastric pain: Secondary | ICD-10-CM

## 2018-06-12 DIAGNOSIS — Z8249 Family history of ischemic heart disease and other diseases of the circulatory system: Secondary | ICD-10-CM

## 2018-06-12 DIAGNOSIS — Z8547 Personal history of malignant neoplasm of testis: Secondary | ICD-10-CM

## 2018-06-12 DIAGNOSIS — F17210 Nicotine dependence, cigarettes, uncomplicated: Secondary | ICD-10-CM | POA: Diagnosis not present

## 2018-06-12 DIAGNOSIS — Z825 Family history of asthma and other chronic lower respiratory diseases: Secondary | ICD-10-CM

## 2018-06-12 DIAGNOSIS — E871 Hypo-osmolality and hyponatremia: Secondary | ICD-10-CM | POA: Diagnosis present

## 2018-06-12 DIAGNOSIS — Z515 Encounter for palliative care: Secondary | ICD-10-CM | POA: Diagnosis not present

## 2018-06-12 DIAGNOSIS — R809 Proteinuria, unspecified: Secondary | ICD-10-CM | POA: Diagnosis present

## 2018-06-12 DIAGNOSIS — M5442 Lumbago with sciatica, left side: Secondary | ICD-10-CM | POA: Diagnosis not present

## 2018-06-12 DIAGNOSIS — N179 Acute kidney failure, unspecified: Principal | ICD-10-CM

## 2018-06-12 DIAGNOSIS — E872 Acidosis: Secondary | ICD-10-CM | POA: Diagnosis present

## 2018-06-12 DIAGNOSIS — N400 Enlarged prostate without lower urinary tract symptoms: Secondary | ICD-10-CM | POA: Diagnosis present

## 2018-06-12 DIAGNOSIS — D649 Anemia, unspecified: Secondary | ICD-10-CM

## 2018-06-12 DIAGNOSIS — C6290 Malignant neoplasm of unspecified testis, unspecified whether descended or undescended: Secondary | ICD-10-CM | POA: Diagnosis present

## 2018-06-12 DIAGNOSIS — J90 Pleural effusion, not elsewhere classified: Secondary | ICD-10-CM | POA: Diagnosis present

## 2018-06-12 DIAGNOSIS — N189 Chronic kidney disease, unspecified: Secondary | ICD-10-CM | POA: Diagnosis present

## 2018-06-12 DIAGNOSIS — F609 Personality disorder, unspecified: Secondary | ICD-10-CM | POA: Diagnosis present

## 2018-06-12 DIAGNOSIS — R109 Unspecified abdominal pain: Secondary | ICD-10-CM | POA: Diagnosis present

## 2018-06-12 DIAGNOSIS — D509 Iron deficiency anemia, unspecified: Secondary | ICD-10-CM | POA: Diagnosis present

## 2018-06-12 DIAGNOSIS — C9000 Multiple myeloma not having achieved remission: Secondary | ICD-10-CM | POA: Diagnosis not present

## 2018-06-12 DIAGNOSIS — D630 Anemia in neoplastic disease: Secondary | ICD-10-CM | POA: Diagnosis present

## 2018-06-12 DIAGNOSIS — M5416 Radiculopathy, lumbar region: Secondary | ICD-10-CM | POA: Diagnosis present

## 2018-06-12 DIAGNOSIS — T39395A Adverse effect of other nonsteroidal anti-inflammatory drugs [NSAID], initial encounter: Secondary | ICD-10-CM | POA: Diagnosis present

## 2018-06-12 DIAGNOSIS — D892 Hypergammaglobulinemia, unspecified: Secondary | ICD-10-CM | POA: Diagnosis present

## 2018-06-12 DIAGNOSIS — J431 Panlobular emphysema: Secondary | ICD-10-CM | POA: Diagnosis present

## 2018-06-12 DIAGNOSIS — D539 Nutritional anemia, unspecified: Secondary | ICD-10-CM | POA: Diagnosis present

## 2018-06-12 DIAGNOSIS — Z981 Arthrodesis status: Secondary | ICD-10-CM

## 2018-06-12 DIAGNOSIS — M48061 Spinal stenosis, lumbar region without neurogenic claudication: Secondary | ICD-10-CM | POA: Diagnosis present

## 2018-06-12 DIAGNOSIS — Z7189 Other specified counseling: Secondary | ICD-10-CM | POA: Diagnosis not present

## 2018-06-12 DIAGNOSIS — G9589 Other specified diseases of spinal cord: Secondary | ICD-10-CM | POA: Diagnosis not present

## 2018-06-12 DIAGNOSIS — G8929 Other chronic pain: Secondary | ICD-10-CM | POA: Diagnosis not present

## 2018-06-12 DIAGNOSIS — R0600 Dyspnea, unspecified: Secondary | ICD-10-CM | POA: Diagnosis not present

## 2018-06-12 DIAGNOSIS — M545 Low back pain: Secondary | ICD-10-CM | POA: Diagnosis not present

## 2018-06-12 DIAGNOSIS — Z720 Tobacco use: Secondary | ICD-10-CM | POA: Diagnosis not present

## 2018-06-12 DIAGNOSIS — Z888 Allergy status to other drugs, medicaments and biological substances status: Secondary | ICD-10-CM

## 2018-06-12 DIAGNOSIS — Z79899 Other long term (current) drug therapy: Secondary | ICD-10-CM

## 2018-06-12 DIAGNOSIS — Z885 Allergy status to narcotic agent status: Secondary | ICD-10-CM

## 2018-06-12 DIAGNOSIS — Z8261 Family history of arthritis: Secondary | ICD-10-CM

## 2018-06-12 DIAGNOSIS — Z886 Allergy status to analgesic agent status: Secondary | ICD-10-CM

## 2018-06-12 DIAGNOSIS — Z8349 Family history of other endocrine, nutritional and metabolic diseases: Secondary | ICD-10-CM

## 2018-06-12 HISTORY — DX: Nutritional anemia, unspecified: D53.9

## 2018-06-12 LAB — LIPASE, BLOOD: Lipase: 33 U/L (ref 11–51)

## 2018-06-12 LAB — BASIC METABOLIC PANEL
Anion gap: 13 (ref 5–15)
BUN: 85 mg/dL — ABNORMAL HIGH (ref 6–20)
CO2: 16 mmol/L — ABNORMAL LOW (ref 22–32)
Calcium: 12.2 mg/dL — ABNORMAL HIGH (ref 8.9–10.3)
Chloride: 102 mmol/L (ref 98–111)
Creatinine, Ser: 5.69 mg/dL — ABNORMAL HIGH (ref 0.61–1.24)
GFR calc Af Amer: 12 mL/min — ABNORMAL LOW (ref >60)
GFR calc non Af Amer: 10 mL/min — ABNORMAL LOW (ref >60)
Glucose, Bld: 99 mg/dL (ref 70–99)
Potassium: 4.4 mmol/L (ref 3.5–5.1)
Sodium: 131 mmol/L — ABNORMAL LOW (ref 135–145)

## 2018-06-12 LAB — COMPREHENSIVE METABOLIC PANEL
ALT: 23 U/L (ref 0–44)
AST: 20 U/L (ref 15–41)
Albumin: 4.4 g/dL (ref 3.5–5.0)
Alkaline Phosphatase: 99 U/L (ref 38–126)
Anion gap: 18 — ABNORMAL HIGH (ref 5–15)
BUN: 95 mg/dL — ABNORMAL HIGH (ref 6–20)
CO2: 16 mmol/L — ABNORMAL LOW (ref 22–32)
Calcium: 13.1 mg/dL (ref 8.9–10.3)
Chloride: 98 mmol/L (ref 98–111)
Creatinine, Ser: 6.14 mg/dL — ABNORMAL HIGH (ref 0.61–1.24)
GFR calc Af Amer: 11 mL/min — ABNORMAL LOW (ref >60)
GFR calc non Af Amer: 9 mL/min — ABNORMAL LOW (ref >60)
Glucose, Bld: 101 mg/dL — ABNORMAL HIGH (ref 70–99)
Potassium: 4.3 mmol/L (ref 3.5–5.1)
Sodium: 132 mmol/L — ABNORMAL LOW (ref 135–145)
Total Bilirubin: 0.6 mg/dL (ref 0.3–1.2)
Total Protein: 7.8 g/dL (ref 6.5–8.1)

## 2018-06-12 LAB — URINALYSIS, ROUTINE W REFLEX MICROSCOPIC
Bacteria, UA: NONE SEEN
Bilirubin Urine: NEGATIVE
Glucose, UA: NEGATIVE mg/dL
Ketones, ur: NEGATIVE mg/dL
Leukocytes,Ua: NEGATIVE
Nitrite: NEGATIVE
Protein, ur: 30 mg/dL — AB
Specific Gravity, Urine: 1.012 (ref 1.005–1.030)
pH: 5 (ref 5.0–8.0)

## 2018-06-12 LAB — CBC
HCT: 25.6 % — ABNORMAL LOW (ref 39.0–52.0)
Hemoglobin: 8.3 g/dL — ABNORMAL LOW (ref 13.0–17.0)
MCH: 34.9 pg — ABNORMAL HIGH (ref 26.0–34.0)
MCHC: 32.4 g/dL (ref 30.0–36.0)
MCV: 107.6 fL — ABNORMAL HIGH (ref 80.0–100.0)
Platelets: 164 10*3/uL (ref 150–400)
RBC: 2.38 MIL/uL — ABNORMAL LOW (ref 4.22–5.81)
RDW: 15.6 % — ABNORMAL HIGH (ref 11.5–15.5)
WBC: 10.6 10*3/uL — ABNORMAL HIGH (ref 4.0–10.5)

## 2018-06-12 LAB — LACTIC ACID, PLASMA
Lactic Acid, Venous: 3.2 mmol/L (ref 0.5–1.9)
Lactic Acid, Venous: 2.3 mmol/L (ref 0.5–1.9)

## 2018-06-12 LAB — CREATININE, URINE, RANDOM: Creatinine, Urine: 75.47 mg/dL

## 2018-06-12 LAB — SODIUM, URINE, RANDOM: Sodium, Ur: 42 mmol/L

## 2018-06-12 LAB — RETICULOCYTES
Immature Retic Fract: 19.7 % — ABNORMAL HIGH (ref 2.3–15.9)
RBC.: 2.07 MIL/uL — ABNORMAL LOW (ref 4.22–5.81)
Retic Count, Absolute: 49.7 10*3/uL (ref 19.0–186.0)
Retic Ct Pct: 2.4 % (ref 0.4–3.1)

## 2018-06-12 LAB — LACTATE DEHYDROGENASE: LDH: 224 U/L — ABNORMAL HIGH (ref 98–192)

## 2018-06-12 MED ORDER — SODIUM CHLORIDE 0.9 % IV BOLUS
1000.0000 mL | Freq: Once | INTRAVENOUS | Status: AC
  Administered 2018-06-12: 1000 mL via INTRAVENOUS

## 2018-06-12 MED ORDER — SODIUM CHLORIDE 0.9 % IV SOLN: 500.0000 mg | INTRAVENOUS | Status: DC

## 2018-06-12 MED ORDER — SODIUM CHLORIDE 0.9 % IV SOLN
500.0000 mg | INTRAVENOUS | Status: DC
  Administered 2018-06-12 – 2018-06-14 (×3): 500 mg via INTRAVENOUS
  Filled 2018-06-12 (×3): qty 500

## 2018-06-12 MED ORDER — FENTANYL CITRATE (PF) 100 MCG/2ML IJ SOLN
50.0000 ug | Freq: Once | INTRAMUSCULAR | Status: AC
  Administered 2018-06-12: 50 ug via INTRAVENOUS
  Filled 2018-06-12: qty 2

## 2018-06-12 MED ORDER — LIDOCAINE VISCOUS HCL 2 % MT SOLN
15.0000 mL | Freq: Once | OROMUCOSAL | Status: AC
  Administered 2018-06-12: 15 mL via ORAL
  Filled 2018-06-12: qty 15

## 2018-06-12 MED ORDER — SODIUM CHLORIDE 0.9 % IV SOLN
1.0000 g | INTRAVENOUS | Status: DC
  Administered 2018-06-12: 1 g via INTRAVENOUS
  Administered 2018-06-13: 20:00:00 via INTRAVENOUS
  Administered 2018-06-14: 1 g via INTRAVENOUS
  Filled 2018-06-12 (×4): qty 10

## 2018-06-12 MED ORDER — SODIUM CHLORIDE 0.9 % IV SOLN
INTRAVENOUS | Status: DC
  Administered 2018-06-12 – 2018-06-21 (×19): via INTRAVENOUS

## 2018-06-12 MED ORDER — SODIUM CHLORIDE 0.9 % IV SOLN: 1.0000 g | INTRAVENOUS | Status: DC

## 2018-06-12 MED ORDER — SODIUM CHLORIDE 0.9 % IV SOLN: Freq: Once | INTRAVENOUS | Status: DC

## 2018-06-12 MED ORDER — ONDANSETRON 4 MG PO TBDP
4.0000 mg | ORAL_TABLET | Freq: Once | ORAL | Status: AC
  Administered 2018-06-12: 4 mg via ORAL
  Filled 2018-06-12: qty 1

## 2018-06-12 MED ORDER — ALUM & MAG HYDROXIDE-SIMETH 200-200-20 MG/5ML PO SUSP
30.0000 mL | Freq: Once | ORAL | Status: AC
  Administered 2018-06-12: 30 mL via ORAL
  Filled 2018-06-12: qty 30

## 2018-06-12 MED ORDER — ENSURE ENLIVE PO LIQD
237.0000 mL | Freq: Two times a day (BID) | ORAL | Status: DC
  Administered 2018-06-13 – 2018-06-21 (×12): 237 mL via ORAL

## 2018-06-12 NOTE — ED Notes (Signed)
Date and time results received: 06/12/18 2:12 PM  Test: lactic acid Critical Value: 3.2  Name of Provider Notified: Sedonia Small, MD  Orders Received? Or Actions Taken?: None at this time, will continue to monitor.

## 2018-06-12 NOTE — ED Notes (Signed)
Date and time results received: 06/12/18 8:22 PM   Test: lactic acid Critical Value: 2.3  Name of Provider Notified: Attempted to notify Steward Ros, MD. No response on cell phone.  Orders Received? Or Actions Taken?: none at this time. Will continue to monitor.

## 2018-06-12 NOTE — ED Notes (Signed)
Date and time results received: 06/12/18 1:53 PM  Test: Calcium Critical Value: 13.1  Name of Provider Notified: Sedonia Small, MD  Orders Received? Or Actions Taken?: None at this time. Will continue to monitor.

## 2018-06-12 NOTE — ED Provider Notes (Addendum)
  Physical Exam  BP 119/60 (BP Location: Right Arm)   Pulse (!) 104   Temp 98.2 F (36.8 C) (Oral)   Resp 19   Ht 6' (1.829 m) Comment: Simultaneous filing. User may not have seen previous data.  Wt 79 kg Comment: Simultaneous filing. User may not have seen previous data.  SpO2 100%   BMI 23.62 kg/m   Physical Exam  ED Course/Procedures   Clinical Course as of Jun 12 1851  Sat Jun 12, 2018  1356 Labs concerning for new anemia, acute renal failure, hypercalcemia, considering underlying malignancy such as multiple myeloma.  Will provide 2 L normal saline, CT abdomen still pending.   [MB]    Clinical Course User Index [MB] Maudie Flakes, MD    Procedures  MDM  Received patient in signout.  Anemia and renal failure.  Abdominal pain.  CT scan showed some pleural effusions but otherwise reassuring.  Will admit to hospitalist.     Davonna Belling, MD 06/12/18 1854  Discussed with Dr. Delton Coombes.  Recommended adding LDH Coombs retic and will save a smear.  He will follow but may need to call tomorrow.    Davonna Belling, MD 06/12/18 [phone]

## 2018-06-12 NOTE — ED Triage Notes (Signed)
Pt states he stomach has been hurting for over a week with no vomiting or diarrhea.

## 2018-06-12 NOTE — ED Notes (Signed)
Called report 4 times. Nurse France Ravens and Lattie Haw unavailable at this time.

## 2018-06-12 NOTE — ED Notes (Signed)
Xray notified of patient being ready for xray.

## 2018-06-12 NOTE — ED Provider Notes (Signed)
[facility] Emergency Department Provider Note MRN:  [id number]  Arrival date & time: 06/12/18     Chief Complaint   Abdominal Pain   History of Present Illness   [name] is a 58 y.o. year-old male with a history of kidney stone, substance abuse, emphysema presenting to the ED with chief complaint of abdominal pain.  2 weeks of persistent upper abdominal pain, radiating around to the bilateral flanks.  Has been present since he fell down the stairs few months ago, but became much worse 2 weeks ago.  Described as a sharp pain.  Associated with nausea, no vomiting, no diarrhea.  Denies lower abdominal pain, no dysuria, no hematuria, no chest pain or shortness of breath.  Pain became more severe this morning, 8 out of 10, constant.  Review of Systems  A complete 10 system review of systems was obtained and all systems are negative except as noted in the HPI and PMH.   Patient's Health History    Past Medical History:  Diagnosis Date  . Allergy   . Arthritis    neck and back  . Blood transfusion without reported diagnosis   . BPH (benign prostatic hyperplasia)   . Cancer [facility]) 2004   testicle  . Chronic kidney disease    kidney stones  . Left lumbar radiculopathy 06/12/2016  . Medical history non-contributory    Pt has scattered thoughts and uncertain of past medical history  . 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 Stones, urinary tract   . Substance abuse ([place])    prescribed oxydcodone- 10 years    Past Surgical History:  Procedure Laterality Date  . BACK SURGERY     x5  . CERVICAL DISC SURGERY     x2  . COLONOSCOPY WITH PROPOFOL N/A 08/11/2016   Procedure: COLONOSCOPY WITH PROPOFOL;  Surgeon: [name], MD;  Location: [facility] ENDO SUITE;  Service: Endoscopy;  Laterality: N/A;  130   . POLYPECTOMY  08/11/2016   Procedure: POLYPECTOMY;  Surgeon: [name], MD;  Location: [facility] ENDO SUITE;  Service: Endoscopy;;  colon  . testicular cancer   2004  . TONSILLECTOMY      Family History  Problem Relation Age of Onset  . COPD Mother   . Heart disease Mother   . Other Father        Never knew his father.  . Thyroid disease Sister   . Arthritis Sister   . Heart disease Sister        bypass  . Colon cancer Neg Hx     Social History   Socioeconomic History  . Marital status: Single    Spouse name: Not on file  . Number of children: 1  . Years of education: 74  . Highest education level: Not on file  Occupational History  . Occupation: retired    Comment: Psychologist, prison and probation services  . Occupation: disabled  Social Needs  . Financial resource strain: Not on file  . Food insecurity:    Worry: Not on file    Inability: Not on file  . Transportation needs:    Medical: Not on file    Non-medical: Not on file  Tobacco Use  . Smoking status: Current Every Day Smoker    Packs/day: 1.00    Years: 40.00    Pack years: 40.00    Types: Cigarettes    Start date: 03/31/1974  . Smokeless tobacco: Never Used  Substance and Sexual Activity  . Alcohol use: Yes  Comment: Occasional  . Drug use: No    Types: Cocaine    Comment: Remote hx of cocaine, quit 1989  . Sexual activity: Yes  Lifestyle  . Physical activity:    Days per week: Not on file    Minutes per session: Not on file  . Stress: Not on file  Relationships  . Social connections:    Talks on phone: Not on file    Gets together: Not on file    Attends religious service: Not on file    Active member of club or organization: Not on file    Attends meetings of clubs or organizations: Not on file    Relationship status: Not on file  . Intimate partner violence:    Fear of current or ex partner: Not on file    Emotionally abused: Not on file    Physically abused: Not on file    Forced sexual activity: Not on file  Other Topics Concern  . Not on file  Social History Narrative   [employer] for 12 years   [employer] for 75 years      Never married   One daughter      Lives  alone   Collect stamps   Plays trumpet   Right-handed   Occasional caffeine use           Physical Exam  Vital Signs and Nursing Notes reviewed Vitals:   06/12/18 1259  BP: 119/60  Pulse: (!) 104  Resp: 19  Temp: 98.2 F (36.8 C)  SpO2: 100%    CONSTITUTIONAL: Chronically ill-appearing, NAD NEURO:  Alert and oriented x 3, no focal deficits EYES:  eyes equal and reactive ENT/NECK:  no LAD, no JVD CARDIO: Regular rate, well-perfused, normal S1 and S2 PULM:  CTAB no wheezing or rhonchi GI/GU:  normal bowel sounds, non-distended, moderate tenderness palpation to the epigastrium MSK/SPINE:  No gross deformities, no edema SKIN:  no rash, atraumatic PSYCH:  Appropriate speech and behavior  Diagnostic and Interventional Summary    EKG Interpretation  Date/Time:  Saturday June 12 2018 13:25:19 EDT Ventricular Rate:  103 PR Interval:    QRS Duration: 100 QT Interval:  324 QTC Calculation: 425 R Axis:   85 Text Interpretation:  Sinus tachycardia Confirmed by Gerlene Fee [phone]) on 06/12/2018 1:34:46 PM      Labs Reviewed  CBC - Abnormal; Notable for the following components:      Result Value   WBC 10.6 (*)    RBC 2.38 (*)    Hemoglobin 8.3 (*)    HCT 25.6 (*)    MCV 107.6 (*)    MCH 34.9 (*)    RDW 15.6 (*)    All other components within normal limits  COMPREHENSIVE METABOLIC PANEL - Abnormal; Notable for the following components:   Sodium 132 (*)    CO2 16 (*)    Glucose, Bld 101 (*)    BUN 95 (*)    Creatinine, Ser 6.14 (*)    Calcium 13.1 (*)    GFR calc non Af Amer 9 (*)    GFR calc Af Amer 11 (*)    Anion gap 18 (*)    All other components within normal limits  LACTIC ACID, PLASMA - Abnormal; Notable for the following components:   Lactic Acid, Venous 3.2 (*)    All other components within normal limits  LIPASE, BLOOD  URINALYSIS, ROUTINE W REFLEX MICROSCOPIC  SODIUM, URINE, RANDOM  CREATININE, URINE, RANDOM  CT ABDOMEN PELVIS WO CONTRAST     (Results Pending)    Medications  alum & mag hydroxide-simeth (MAALOX/MYLANTA) 200-200-20 MG/5ML suspension 30 mL (30 mLs Oral Given 06/12/18 1331)    And  lidocaine (XYLOCAINE) 2 % viscous mouth solution 15 mL (15 mLs Oral Given 06/12/18 1331)  ondansetron (ZOFRAN-ODT) disintegrating tablet 4 mg (4 mg Oral Given 06/12/18 1331)  sodium chloride 0.9 % bolus 1,000 mL (1,000 mLs Intravenous New Bag/Given 06/12/18 1405)  sodium chloride 0.9 % bolus 1,000 mL (1,000 mLs Intravenous New Bag/Given 06/12/18 1405)  fentaNYL (SUBLIMAZE) injection 50 mcg (50 mcg Intravenous Given 06/12/18 1434)     Procedures Critical Care Critical Care Documentation Critical care time provided by me (excluding procedures): 38 minutes  Condition necessitating critical care: Acute renal failure, hypercalcemia  Components of critical care management: reviewing of prior records, laboratory and imaging interpretation, frequent re-examination and reassessment of vital signs, administration of IV fluids, discussion with consulting services.    ED Course and Medical Decision Making  I have reviewed the triage vital signs and the nursing notes.  Pertinent labs & imaging results that were available during my care of the patient were reviewed by me and considered in my medical decision making (see below for details).  Considering pancreatitis, peptic ulcer disease, gastritis, traumatic injury to the rib cage, less likely perforated viscus.  Moderately tender, CT pending.  Inconsistent with ACS, EKG is without acute concerns.  Clinical Course as of Jun 12 1522  Sat Jun 12, 2018  1356 Labs concerning for new anemia, acute renal failure, hypercalcemia, considering underlying malignancy such as multiple myeloma.  Will provide 2 L normal saline, CT abdomen still pending.   [MB]    Clinical Course User Index [MB] Maudie Flakes, MD    Attempted early admission to hospital service without CT results, declined until we have  results.  The concern for underlying malignancy is higher after reviewing recent MRI that was suggestive of myeloproliferative process.  Patient currently without signs or symptoms of cauda equina.  Signed out to Dr. Alvino Chapel at shift change.  Barth Kirks. Sedonia Small, MD [place] [email or web address]  Final Clinical Impressions(s) / ED Diagnoses     ICD-10-CM   1. AKI (acute kidney injury) ([place]) N17.9   2. Hypercalcemia E83.52   3. Anemia, unspecified type D64.9   4. Epigastric pain R10.13     ED Discharge Orders    None         Maudie Flakes, MD 06/12/18 1525

## 2018-06-12 NOTE — H&P (Signed)
History and Physical    [name] IZT:[id number] DOB: [birth year] DOA: 06/12/2018  PCP: Patient, No Pcp Per  Patient coming from: home  Chief Complaint:  Back pain, weakness  HPI: [name] is a 58 y.o. male with medical history significant of back pain since December after a fall, ckd, has been seen by a spine surgeon and told it looks like he may have cancer in his bones.  Pt does nothave a pcp and has had no f/u on this per mri done last month.  He has been taking nsaids otc and tramadol.  Denies n/v/d.  No fevers.  No cough or sob.  No wt loss.  Eating alright but not normally.  No urinary symptoms.  Pt referred for admission for aki, hi ca level, back pain and lactic acidosis.  Review of Systems: As per HPI otherwise 10 point review of systems negative.   Past Medical History:  Diagnosis Date  . Allergy   . Arthritis    neck and back  . Blood transfusion without reported diagnosis   . BPH (benign prostatic hyperplasia)   . Cancer [facility]) 2004   testicle  . Chronic kidney disease    kidney stones  . Left lumbar radiculopathy 06/12/2016  . 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 Medical history non-contributory    Pt has scattered thoughts and uncertain of past medical history  . 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 Stones, urinary tract   . Substance abuse ([place])    prescribed oxydcodone- 10 years    Past Surgical History:  Procedure Laterality Date  . BACK SURGERY     x5  . CERVICAL DISC SURGERY     x2  . COLONOSCOPY WITH PROPOFOL N/A 08/11/2016   Procedure: COLONOSCOPY WITH PROPOFOL;  Surgeon: [name], MD;  Location[facility];  Service: Endoscopy;  Laterality: N/A;  130   . POLYPECTOMY  08/11/2016   Procedure: POLYPECTOMY;  Surgeon: [name], MD;  Location[facility];  Service: Endoscopy;;  colon  . testicular cancer  2004  . TONSILLECTOMY       reports that he has been smoking cigarettes. He started smoking about 44 years ago. He has a  40.00 pack-year smoking history. He has never used smokeless tobacco. He reports current alcohol use. He reports that he does not use drugs.  Allergies  Allergen Reactions  . Acetaminophen Nausea Only and Other (See Comments)    Elevated liver enzymes  . Cymbalta [Duloxetine Hcl] Swelling    Facial swelling  . Diclofenac Sodium Swelling       . Imodium [Loperamide] Swelling    facial  . Lyrica [Pregabalin] Swelling  . Morphine Other (See Comments)    Bradycardia   . Vioxx [Rofecoxib] Swelling  . Aleve [Naproxen] Swelling    eye    Family History  Problem Relation Age of Onset  . COPD Mother   . Heart disease Mother   . Other Father        Never knew his father.  . Thyroid disease Sister   . Arthritis Sister   . Heart disease Sister        bypass  . Colon cancer Neg Hx     Prior to Admission medications   Medication Sig Start Date End Date Taking? Authorizing Provider  traMADol (ULTRAM) 50 MG tablet Take 1 tablet (50 mg total) by mouth every 6 (six) hours as needed. 05/29/18  Yes Malvin Johns, MD  lidocaine (LIDODERM) 5 %  Place 1 patch onto the skin daily. Remove & Discard patch within 12 hours or as directed by MD Patient not taking: Reported on 06/12/2018 03/31/18   Volanda Napoleon, PA-C  OVER THE COUNTER MEDICATION [facility] as needed    [provider]    Physical Exam: Vitals:   06/12/18 1900 06/12/18 1915 06/12/18 1930 06/12/18 2000  BP: (!) 115/56   (!) 118/58  Pulse:  (!) 102 (!) 103 (!) 101  Resp: 19 (!) 26 (!) 27 16  Temp:      TempSrc:      SpO2:  99% 97% 100%  Weight:      Height:          Constitutional: NAD, calm, comfortable Vitals:   06/12/18 1900 06/12/18 1915 06/12/18 1930 06/12/18 2000  BP: (!) 115/56   (!) 118/58  Pulse:  (!) 102 (!) 103 (!) 101  Resp: 19 (!) 26 (!) 27 16  Temp:      TempSrc:      SpO2:  99% 97% 100%  Weight:      Height:       Eyes: PERRL, lids and conjunctivae normal ENMT: Mucous membranes  are moist. Posterior pharynx clear of any exudate or lesions.Normal dentition.  Neck: normal, supple, no masses, no thyromegaly Respiratory: clear to auscultation bilaterally, no wheezing, no crackles. Normal respiratory effort. No accessory muscle use.  Cardiovascular: Regular rate and rhythm, no murmurs / rubs / gallops. No extremity edema. 2+ pedal pulses. No carotid bruits.  Abdomen: no tenderness, no masses palpated. No hepatosplenomegaly. Bowel sounds positive.  Musculoskeletal: no clubbing / cyanosis. No joint deformity upper and lower extremities. Good ROM, no contractures. Normal muscle tone.  Skin: no rashes, lesions, ulcers. No induration Neurologic: CN 2-12 grossly intact. Sensation intact, DTR normal. Strength 5/5 in all 4.  Psychiatric: Normal judgment and insight. Alert and oriented x 3. Normal mood.    Labs on Admission: I have personally reviewed following labs and imaging studies  CBC: Recent Labs  Lab 06/12/18 1325  WBC 10.6*  HGB 8.3*  HCT 25.6*  MCV 107.6*  PLT 681   Basic Metabolic Panel: Recent Labs  Lab 06/12/18 1325  NA 132*  K 4.3  CL 98  CO2 16*  GLUCOSE 101*  BUN 95*  CREATININE 6.14*  CALCIUM 13.1*   GFR: Estimated Creatinine Clearance: 14.2 mL/min (A) (by C-G formula based on SCr of 6.14 mg/dL (H)). Liver Function Tests: Recent Labs  Lab 06/12/18 1325  AST 20  ALT 23  ALKPHOS 99  BILITOT 0.6  PROT 7.8  ALBUMIN 4.4   Recent Labs  Lab 06/12/18 1325  LIPASE 33   No results for input(s): AMMONIA in the last 168 hours. Coagulation Profile: No results for input(s): INR, PROTIME in the last 168 hours. Cardiac Enzymes: No results for input(s): CKTOTAL, CKMB, CKMBINDEX, TROPONINI in the last 168 hours. BNP (last 3 results) No results for input(s): PROBNP in the last 8760 hours. HbA1C: No results for input(s): HGBA1C in the last 72 hours. CBG: No results for input(s): GLUCAP in the last 168 hours. Lipid Profile: No results for  input(s): CHOL, HDL, LDLCALC, TRIG, CHOLHDL, LDLDIRECT in the last 72 hours. Thyroid Function Tests: No results for input(s): TSH, T4TOTAL, FREET4, T3FREE, THYROIDAB in the last 72 hours. Anemia Panel: No results for input(s): VITAMINB12, FOLATE, FERRITIN, TIBC, IRON, RETICCTPCT in the last 72 hours. Urine analysis:    Component Value Date/Time   COLORURINE YELLOW  06/12/2018 [place] 06/12/2018 1356   LABSPEC 1.012 06/12/2018 1356   PHURINE 5.0 06/12/2018 1356   GLUCOSEU NEGATIVE 06/12/2018 1356   HGBUR MODERATE (A) 06/12/2018 1356   BILIRUBINUR NEGATIVE 06/12/2018 1356   KETONESUR NEGATIVE 06/12/2018 1356   PROTEINUR 30 (A) 06/12/2018 1356   UROBILINOGEN 0.2 02/09/2008 2042   NITRITE NEGATIVE 06/12/2018 1356   LEUKOCYTESUR NEGATIVE 06/12/2018 1356   Sepsis Labs: !!!!!!!!!!!!!!!!!!!!!!!!!!!!!!!!!!!!!!!!!!!! @LABRCNTIP (procalcitonin:4,lacticidven:4) )No results found for this or any previous visit (from the past 240 hour(s)).   Radiological Exams on Admission: Ct Abdomen Pelvis Wo Contrast  Result Date: 06/12/2018 CLINICAL DATA:  Abdominal pain and fever EXAM: CT ABDOMEN AND PELVIS WITHOUT CONTRAST TECHNIQUE: Multidetector CT imaging of the abdomen and pelvis was performed following the standard protocol without IV contrast. Oral contrast was administered. COMPARISON:  Aug 12, 2012 FINDINGS: Lower chest: There are free-flowing pleural effusions bilaterally with bibasilar atelectasis. There is questionable mild consolidation in the posterior left lung base as well. Hepatobiliary: No focal liver lesions are appreciable on this noncontrast enhanced study. The gallbladder wall is not appreciably thickened. There is no biliary duct dilatation. Pancreas: No pancreatic mass or inflammatory focus. Spleen: No splenic lesions are evident. Adrenals/Urinary Tract: Adrenals bilaterally appear unremarkable. Kidneys bilaterally show no evident mass or hydronephrosis on either side. There  is no appreciable renal or ureteral calculus on either side. Urinary bladder is midline with wall thickness within normal limits. Stomach/Bowel: There are sigmoid diverticula without overt diverticulitis. There is wall thickening in portions of the mid sigmoid colon without surrounding inflammation. Suspect muscular hypertrophy in this area from chronic diverticulosis. No bowel wall thickening is noted elsewhere. No bowel obstruction appreciable. No free air or portal venous air. Vascular/Lymphatic: There is aortic and bilateral iliac artery atherosclerosis. No aneurysm evident. There is no appreciable adenopathy in the abdomen or pelvis. Reproductive: Prostate and seminal vesicles appear normal in size and contour. There is no appreciable pelvic mass. Other: Appendix appears normal. No abscess or ascites is evident in the abdomen or pelvis. Musculoskeletal: There is extensive postoperative change from L1-L4. There is arthropathy throughout the lumbar region. There are no blastic or lytic bone lesions. There is no intramuscular or abdominal wall lesion evident. IMPRESSION: 1. Moderate free-flowing pleural effusions bilaterally with bibasilar atelectasis. Question a degree of superimposed pneumonia in the posterior left base. 2. Wall thickening in the mid sigmoid colon, likely due to muscular hypertrophy from chronic diverticulosis. Earliest changes of diverticulitis in this area are difficult to entirely exclude. No overt diverticulitis is evident. 3. No bowel obstruction. Appendix appears normal. No evident abscess in the abdomen or pelvis. 4. No evident renal or ureteral calculus. No evident hydronephrosis on either side. 5.  Aortoiliac atherosclerosis. 6.  Extensive postoperative change throughout the lumbar spine. Electronically Signed   By: Lowella Grip III M.D.   On: 06/12/2018 17:14    Old chart reviewed Case discussed with dr pickering in the ed   Assessment/Plan 58 yo male with back pain, found  to have effusions/infiltrate on imaging along with imaging findings a month ago concerning for underlying malignancy with aki, lactic acidosis Principal Problem:   AKI (acute kidney injury) ([place])- given 2 liters of ivf in the ed.  Repeat bmp now.  uop adeq and monitor.  ua not infected.  R/o MM , oncology consulted and will see in am.    Active Problems:   PNA (pneumonia) presumptive - f/u blood and sput cx.  Place on azithro/rocephin  Hypercalcemia- ck pth.  Repeat level after 2 liters ivf, bony abn noted on mri, onc to review smear.  Spep. ldh pending   Substance abuse ([place])- denies   Personality disorder ([place])- noted   Panlobular emphysema ([place])- stable at this time, lung clear   Chronic left-sided low back pain with left-sided sciatica- noted   Macrocytic anemia- ck anemia panel no overt bleeding      DVT prophylaxis: scds Code Status: full Family Communication:  dtr Disposition Plan:  days Consults called:  oncology Admission status:  admit   Sicily Zaragoza A MD [facility]  If 7PM-7AM, please contact night-coverage [email or web address] Password [facility]  06/12/2018, 8:36 PM

## 2018-06-12 NOTE — ED Notes (Signed)
Cleaned pt up after accidental bowel movement in bed. Patient is now clean and dry with new gown and linens.

## 2018-06-13 ENCOUNTER — Other Ambulatory Visit: Payer: Self-pay

## 2018-06-13 LAB — BASIC METABOLIC PANEL
Anion gap: 13 (ref 5–15)
BUN: 85 mg/dL — ABNORMAL HIGH (ref 6–20)
CO2: 16 mmol/L — ABNORMAL LOW (ref 22–32)
Calcium: 12.3 mg/dL — ABNORMAL HIGH (ref 8.9–10.3)
Chloride: 105 mmol/L (ref 98–111)
Creatinine, Ser: 5.48 mg/dL — ABNORMAL HIGH (ref 0.61–1.24)
GFR calc Af Amer: 12 mL/min — ABNORMAL LOW (ref >60)
GFR calc non Af Amer: 10 mL/min — ABNORMAL LOW (ref >60)
Glucose, Bld: 83 mg/dL (ref 70–99)
Potassium: 4.5 mmol/L (ref 3.5–5.1)
Sodium: 134 mmol/L — ABNORMAL LOW (ref 135–145)

## 2018-06-13 LAB — CBC WITH DIFFERENTIAL/PLATELET
Abs Immature Granulocytes: 0.19 10*3/uL — ABNORMAL HIGH (ref 0.00–0.07)
Basophils Absolute: 0 10*3/uL (ref 0.0–0.1)
Basophils Relative: 0 %
Eosinophils Absolute: 0.1 10*3/uL (ref 0.0–0.5)
Eosinophils Relative: 1 %
HCT: 22.9 % — ABNORMAL LOW (ref 39.0–52.0)
Hemoglobin: 7.3 g/dL — ABNORMAL LOW (ref 13.0–17.0)
Immature Granulocytes: 2 %
Lymphocytes Relative: 39 %
Lymphs Abs: 3.8 10*3/uL (ref 0.7–4.0)
MCH: 35.4 pg — ABNORMAL HIGH (ref 26.0–34.0)
MCHC: 31.9 g/dL (ref 30.0–36.0)
MCV: 111.2 fL — ABNORMAL HIGH (ref 80.0–100.0)
Monocytes Absolute: 1.9 10*3/uL — ABNORMAL HIGH (ref 0.1–1.0)
Monocytes Relative: 20 %
Neutro Abs: 3.6 10*3/uL (ref 1.7–7.7)
Neutrophils Relative %: 38 %
Platelets: 150 10*3/uL (ref 150–400)
RBC: 2.06 MIL/uL — ABNORMAL LOW (ref 4.22–5.81)
RDW: 15.6 % — ABNORMAL HIGH (ref 11.5–15.5)
WBC: 9.6 10*3/uL (ref 4.0–10.5)
nRBC: 3.2 % — ABNORMAL HIGH (ref 0.0–0.2)

## 2018-06-13 LAB — RESPIRATORY PANEL BY PCR

## 2018-06-13 LAB — DIFFERENTIAL
Band Neutrophils: 3 %
Basophils Absolute: 0.1 10*3/uL (ref 0.0–0.1)
Basophils Relative: 1 %
Eosinophils Absolute: 0.3 10*3/uL (ref 0.0–0.5)
Eosinophils Relative: 3 %
Lymphocytes Relative: 22 %
Lymphs Abs: 2.2 10*3/uL (ref 0.7–4.0)
Monocytes Absolute: 1.3 10*3/uL — ABNORMAL HIGH (ref 0.1–1.0)
Monocytes Relative: 13 %
Neutro Abs: 5.4 10*3/uL (ref 1.7–7.7)
Neutrophils Relative %: 52 %
Other: 6 %
nRBC: 3 /100 WBC — ABNORMAL HIGH

## 2018-06-13 LAB — IRON AND TIBC
Iron: 131 ug/dL (ref 45–182)
Saturation Ratios: 35 % (ref 17.9–39.5)
TIBC: 373 ug/dL (ref 250–450)
UIBC: 242 ug/dL

## 2018-06-13 LAB — FOLATE: Folate: 5.5 ng/mL — ABNORMAL LOW (ref >5.9)

## 2018-06-13 LAB — FERRITIN: Ferritin: 612 ng/mL — ABNORMAL HIGH (ref 24–336)

## 2018-06-13 LAB — VITAMIN B12: Vitamin B-12: 518 pg/mL (ref 180–914)

## 2018-06-13 LAB — MRSA PCR SCREENING: MRSA by PCR: NEGATIVE

## 2018-06-13 LAB — DIRECT ANTIGLOBULIN TEST (NOT AT ARMC)
DAT, IgG: NEGATIVE
DAT, complement: NEGATIVE

## 2018-06-13 LAB — SAVE SMEAR(SSMR), FOR PROVIDER SLIDE REVIEW

## 2018-06-13 MED ORDER — LIDOCAINE 5 % EX PTCH
1.0000 | MEDICATED_PATCH | CUTANEOUS | Status: DC
  Administered 2018-06-13 – 2018-06-14 (×2): 1 via TRANSDERMAL
  Filled 2018-06-13 (×6): qty 1

## 2018-06-13 MED ORDER — TRAMADOL HCL 50 MG PO TABS
50.0000 mg | ORAL_TABLET | Freq: Two times a day (BID) | ORAL | Status: DC | PRN
  Administered 2018-06-13 – 2018-06-14 (×2): 50 mg via ORAL
  Filled 2018-06-13 (×2): qty 1

## 2018-06-13 NOTE — Progress Notes (Signed)
PROGRESS NOTE  [name] BSW:[id number] DOB: Sep 21, 1960 DOA: 06/12/2018 PCP: Patient, No Pcp Per  HPI/Recap of past 36 hours: 58-year-old male with acute on chronic back pain multiple back surgeries currently getting treatment in pain management receiving epidural.  Admitted for acute kidney injury patient never had kidney problem he stated and he has lactic acidosis found to have pleural effusion and infiltrate there is concern for malignancy possibly multiple myeloma.  Oncology has been consulted.  Assessment/Plan: Principal Problem:   AKI (acute kidney injury) ([place]) Active Problems:   Substance abuse ([place])   Personality disorder ([place])   Panlobular emphysema ([place])   Chronic left-sided low back pain with left-sided sciatica   PNA (pneumonia)   Hypercalcemia   Macrocytic anemia   1.Acute kidney injury his creatinine was 6.25 on admission, with IVF fluid it came down through 5.4 also his BUN was 95 it has come down to 85.  I have consulted with nephrology to see him in the morning  2.  Pleural effusion with infiltrate possible pneumonia.  Patient was started on ceftriaxone and Zithromax IV will continue that  3.  Hypercalcemia patient received 2 L of IV fluid in the emergency room will monitor his potassium level  4.  Chronic low back pain patient has had multiple back surgeries is currently in pain management.  He stated that he has been getting epidural and also the started him on tramadol.  I will restart his tramadol he was initially started on lidocaine patch 3 which he said is not helping.  5.  Microcytic anemia probably chronic kidney disease there is no bleeding source will monitor  6.  Hyponatremia is improving continue IV hydration  Code Status: Full  Severity of Illness: The appropriate patient status for this patient is INPATIENT. Inpatient status is judged to be reasonable and necessary in order to provide the required intensity of service to ensure the patient's  safety. The patient's presenting symptoms, physical exam findings, and initial radiographic and laboratory data in the context of their chronic comorbidities is felt to place them at high risk for further clinical deterioration. Furthermore, it is not anticipated that the patient will be medically stable for discharge from the hospital within 2 midnights of admission. The following factors support the patient status of inpatient.   " Acute renal failure possible pneumonia with pleural effusion ,acute on chronic back pain * I certify that at the point of admission it is my clinical judgment that the patient will require inpatient hospital care spanning beyond 2 midnights from the point of admission due to high intensity of service, high risk for further deterioration and high frequency of surveillance required.*    Family Communication: Sister wife and daughter in the room  Disposition Plan: Home when stable   Consultants:  Neurology  Oncology  Procedures:  None  Antimicrobials:  Ceftriaxone  Zithromax  DVT prophylaxis: SCD   Objective: Vitals:   06/12/18 2130 06/12/18 2200 06/12/18 2221 06/13/18 0654  BP: 135/62 128/62 125/68 123/63  Pulse: (!) 103 96 (!) 107 (!) 109  Resp: 16 (!) 25 20 20   Temp:   98.1 F (36.7 C) 98 F (36.7 C)  TempSrc:   Oral Oral  SpO2: 99% 94% 100% 100%  Weight:   77.1 kg   Height:   6' (1.829 m)     Intake/Output Summary (Last 24 hours) at 06/13/2018 1355 Last data filed at 06/13/2018 1055 Gross per 24 hour  Intake 3171.44 ml  Output 875  ml  Net 2296.44 ml   Filed Weights   06/12/18 1259 06/12/18 2221  Weight: 79 kg 77.1 kg   Body mass index is 23.06 kg/m.  Exam:  . General: 58 y.o. year-old male well developed well nourished in no acute distress.  Alert and oriented x3. . Cardiovascular: Regular rate and rhythm with no rubs or gallops.  No thyromegaly or JVD noted.   Marland Kitchen Respiratory: Clear to auscultation with no wheezes or rales.  Good inspiratory effort. . Abdomen: Soft nontender nondistended with normal bowel sounds x4 quadrants. . Musculoskeletal: Tenderness in the lower back.  No lower extremity edema. 2/4 pulses in all 4 extremities. . Skin: No ulcerative lesions noted or rashes, . Psychiatry: Mood is appropriate for condition and setting    Data Reviewed: CBC: Recent Labs  Lab 06/12/18 1325 06/12/18 2240 06/13/18 0639  WBC 10.6*  --  9.6  NEUTROABS  --  5.4 3.6  HGB 8.3*  --  7.3*  HCT 25.6*  --  22.9*  MCV 107.6*  --  111.2*  PLT 164  --  562   Basic Metabolic Panel: Recent Labs  Lab 06/12/18 1325 06/12/18 2240 06/13/18 0639  NA 132* 131* 134*  K 4.3 4.4 4.5  CL 98 102 105  CO2 16* 16* 16*  GLUCOSE 101* 99 83  BUN 95* 85* 85*  CREATININE 6.14* 5.69* 5.48*  CALCIUM 13.1* 12.2* 12.3*   GFR: Estimated Creatinine Clearance: 15.8 mL/min (A) (by C-G formula based on SCr of 5.48 mg/dL (H)). Liver Function Tests: Recent Labs  Lab 06/12/18 1325  AST 20  ALT 23  ALKPHOS 99  BILITOT 0.6  PROT 7.8  ALBUMIN 4.4   Recent Labs  Lab 06/12/18 1325  LIPASE 33   No results for input(s): AMMONIA in the last 168 hours. Coagulation Profile: No results for input(s): INR, PROTIME in the last 168 hours. Cardiac Enzymes: No results for input(s): CKTOTAL, CKMB, CKMBINDEX, TROPONINI in the last 168 hours. BNP (last 3 results) No results for input(s): PROBNP in the last 8760 hours. HbA1C: No results for input(s): HGBA1C in the last 72 hours. CBG: No results for input(s): GLUCAP in the last 168 hours. Lipid Profile: No results for input(s): CHOL, HDL, LDLCALC, TRIG, CHOLHDL, LDLDIRECT in the last 72 hours. Thyroid Function Tests: No results for input(s): TSH, T4TOTAL, FREET4, T3FREE, THYROIDAB in the last 72 hours. Anemia Panel: Recent Labs    06/12/18 2240 06/12/18 2241  VITAMINB12 518  --   FOLATE  --  5.5*  FERRITIN 612*  --   TIBC 373  --   IRON 131  --   RETICCTPCT 2.4  --     Urine analysis:    Component Value Date/Time   COLORURINE YELLOW 06/12/2018 1356   APPEARANCEUR CLEAR 06/12/2018 1356   LABSPEC 1.012 06/12/2018 1356   PHURINE 5.0 06/12/2018 1356   GLUCOSEU NEGATIVE 06/12/2018 1356   HGBUR MODERATE (A) 06/12/2018 1356   BILIRUBINUR NEGATIVE 06/12/2018 1356   [place] 06/12/2018 1356   PROTEINUR 30 (A) 06/12/2018 1356   UROBILINOGEN 0.2 02/09/2008 2042   NITRITE NEGATIVE 06/12/2018 1356   LEUKOCYTESUR NEGATIVE 06/12/2018 1356   Sepsis Labs: @LABRCNTIP (procalcitonin:4,lacticidven:4)  ) Recent Results (from the past 240 hour(s))  MRSA PCR Screening     Status: None   Collection Time: 06/13/18 12:20 AM  Result Value Ref Range Status   MRSA by PCR NEGATIVE NEGATIVE Final    Comment:        The GeneXpert  MRSA Assay (FDA approved for NASAL specimens only), is one component of a comprehensive MRSA colonization surveillance program. It is not intended to diagnose MRSA infection nor to guide or monitor treatment for MRSA infections. Performed at [facility], [address] [place], [place] [address]       Studies: Ct Abdomen Pelvis Wo Contrast  Result Date: 06/12/2018 CLINICAL DATA:  Abdominal pain and fever EXAM: CT ABDOMEN AND PELVIS WITHOUT CONTRAST TECHNIQUE: Multidetector CT imaging of the abdomen and pelvis was performed following the standard protocol without IV contrast. Oral contrast was administered. COMPARISON:  Aug 12, 2012 FINDINGS: Lower chest: There are free-flowing pleural effusions bilaterally with bibasilar atelectasis. There is questionable mild consolidation in the posterior left lung base as well. Hepatobiliary: No focal liver lesions are appreciable on this noncontrast enhanced study. The gallbladder wall is not appreciably thickened. There is no biliary duct dilatation. Pancreas: No pancreatic mass or inflammatory focus. Spleen: No splenic lesions are evident. Adrenals/Urinary Tract: Adrenals bilaterally appear  unremarkable. Kidneys bilaterally show no evident mass or hydronephrosis on either side. There is no appreciable renal or ureteral calculus on either side. Urinary bladder is midline with wall thickness within normal limits. Stomach/Bowel: There are sigmoid diverticula without overt diverticulitis. There is wall thickening in portions of the mid sigmoid colon without surrounding inflammation. Suspect muscular hypertrophy in this area from chronic diverticulosis. No bowel wall thickening is noted elsewhere. No bowel obstruction appreciable. No free air or portal venous air. Vascular/Lymphatic: There is aortic and bilateral iliac artery atherosclerosis. No aneurysm evident. There is no appreciable adenopathy in the abdomen or pelvis. Reproductive: Prostate and seminal vesicles appear normal in size and contour. There is no appreciable pelvic mass. Other: Appendix appears normal. No abscess or ascites is evident in the abdomen or pelvis. Musculoskeletal: There is extensive postoperative change from L1-L4. There is arthropathy throughout the lumbar region. There are no blastic or lytic bone lesions. There is no intramuscular or abdominal wall lesion evident. IMPRESSION: 1. Moderate free-flowing pleural effusions bilaterally with bibasilar atelectasis. Question a degree of superimposed pneumonia in the posterior left base. 2. Wall thickening in the mid sigmoid colon, likely due to muscular hypertrophy from chronic diverticulosis. Earliest changes of diverticulitis in this area are difficult to entirely exclude. No overt diverticulitis is evident. 3. No bowel obstruction. Appendix appears normal. No evident abscess in the abdomen or pelvis. 4. No evident renal or ureteral calculus. No evident hydronephrosis on either side. 5.  Aortoiliac atherosclerosis. 6.  Extensive postoperative change throughout the lumbar spine. Electronically Signed   By: Lowella Grip III M.D.   On: 06/12/2018 17:14   Dg Chest 2  View  Result Date: 06/12/2018 CLINICAL DATA:  Stomach pain for a week. No vomiting or diarrhea. Fever. EXAM: CHEST - 2 VIEW COMPARISON:  CT chest 05/28/2016. Chest 11/04/2010 FINDINGS: Normal heart size and pulmonary vascularity. Small left pleural effusion with infiltration in the left lung base. This suggests pneumonia. Right lung is clear. No pneumothorax. Mediastinal contours appear intact. IMPRESSION: Small left pleural effusion with infiltration in the left lung base suggesting pneumonia. Electronically Signed   By: Lucienne Capers M.D.   On: 06/12/2018 21:18    Scheduled Meds: . feeding supplement (ENSURE ENLIVE)  237 mL Oral BID BM  . lidocaine  1 patch Transdermal Q24H    Continuous Infusions: . sodium chloride 100 mL/hr at 06/13/18 1043  . azithromycin Stopped (06/12/18 2151)  . cefTRIAXone (ROCEPHIN)  IV 1 g (06/12/18 2306)  LOS: 1 day     Cristal Deer, MD [facility]  To reach me or the doctor on call, go to: [email or web address] Password [email or web address]  06/13/2018, 1:55 PM

## 2018-06-14 ENCOUNTER — Inpatient Hospital Stay (HOSPITAL_COMMUNITY): Payer: Medicare Other

## 2018-06-14 DIAGNOSIS — N179 Acute kidney failure, unspecified: Principal | ICD-10-CM

## 2018-06-14 DIAGNOSIS — R0600 Dyspnea, unspecified: Secondary | ICD-10-CM

## 2018-06-14 DIAGNOSIS — M5416 Radiculopathy, lumbar region: Secondary | ICD-10-CM

## 2018-06-14 DIAGNOSIS — F17210 Nicotine dependence, cigarettes, uncomplicated: Secondary | ICD-10-CM

## 2018-06-14 DIAGNOSIS — J431 Panlobular emphysema: Secondary | ICD-10-CM

## 2018-06-14 DIAGNOSIS — D539 Nutritional anemia, unspecified: Secondary | ICD-10-CM

## 2018-06-14 LAB — PROTEIN / CREATININE RATIO, URINE
Creatinine, Urine: 58.21 mg/dL
Protein Creatinine Ratio: 5.29 mg/mg{Cre} — ABNORMAL HIGH (ref 0.00–0.15)
Total Protein, Urine: 308 mg/dL

## 2018-06-14 LAB — CBC WITH DIFFERENTIAL/PLATELET
Abs Immature Granulocytes: 0.25 10*3/uL — ABNORMAL HIGH (ref 0.00–0.07)
Basophils Absolute: 0.1 10*3/uL (ref 0.0–0.1)
Basophils Relative: 1 %
Eosinophils Absolute: 0.1 10*3/uL (ref 0.0–0.5)
Eosinophils Relative: 1 %
HCT: 22.5 % — ABNORMAL LOW (ref 39.0–52.0)
Hemoglobin: 7 g/dL — ABNORMAL LOW (ref 13.0–17.0)
Immature Granulocytes: 2 %
Lymphocytes Relative: 39 %
Lymphs Abs: 4.1 10*3/uL — ABNORMAL HIGH (ref 0.7–4.0)
MCH: 34.7 pg — ABNORMAL HIGH (ref 26.0–34.0)
MCHC: 31.1 g/dL (ref 30.0–36.0)
MCV: 111.4 fL — ABNORMAL HIGH (ref 80.0–100.0)
Monocytes Absolute: 2 10*3/uL — ABNORMAL HIGH (ref 0.1–1.0)
Monocytes Relative: 18 %
Neutro Abs: 4.2 10*3/uL (ref 1.7–7.7)
Neutrophils Relative %: 39 %
Platelets: 165 10*3/uL (ref 150–400)
RBC: 2.02 MIL/uL — ABNORMAL LOW (ref 4.22–5.81)
RDW: 15.5 % (ref 11.5–15.5)
WBC: 10.7 10*3/uL — ABNORMAL HIGH (ref 4.0–10.5)
nRBC: 2.4 % — ABNORMAL HIGH (ref 0.0–0.2)

## 2018-06-14 LAB — PROTEIN ELECTROPHORESIS, SERUM
A/G Ratio: 1.2 (ref 0.7–1.7)
Albumin ELP: 3.6 g/dL (ref 2.9–4.4)
Alpha-1-Globulin: 0.1 g/dL (ref 0.0–0.4)
Alpha-2-Globulin: 1.1 g/dL — ABNORMAL HIGH (ref 0.4–1.0)
Beta Globulin: 0.8 g/dL (ref 0.7–1.3)
Gamma Globulin: 0.8 g/dL (ref 0.4–1.8)
Globulin, Total: 2.9 g/dL (ref 2.2–3.9)
M-Spike, %: 0.4 g/dL — ABNORMAL HIGH
Total Protein ELP: 6.5 g/dL (ref 6.0–8.5)

## 2018-06-14 LAB — RAPID URINE DRUG SCREEN, HOSP PERFORMED
Amphetamines: NOT DETECTED
Barbiturates: NOT DETECTED
Benzodiazepines: NOT DETECTED
Cocaine: NOT DETECTED
Opiates: NOT DETECTED
Tetrahydrocannabinol: NOT DETECTED

## 2018-06-14 LAB — BASIC METABOLIC PANEL
Anion gap: 11 (ref 5–15)
BUN: 71 mg/dL — ABNORMAL HIGH (ref 6–20)
CO2: 16 mmol/L — ABNORMAL LOW (ref 22–32)
Calcium: 12.2 mg/dL — ABNORMAL HIGH (ref 8.9–10.3)
Chloride: 108 mmol/L (ref 98–111)
Creatinine, Ser: 5.14 mg/dL — ABNORMAL HIGH (ref 0.61–1.24)
GFR calc Af Amer: 13 mL/min — ABNORMAL LOW (ref >60)
GFR calc non Af Amer: 11 mL/min — ABNORMAL LOW (ref >60)
Glucose, Bld: 82 mg/dL (ref 70–99)
Potassium: 4.9 mmol/L (ref 3.5–5.1)
Sodium: 135 mmol/L (ref 135–145)

## 2018-06-14 LAB — PTH, INTACT AND CALCIUM
Calcium, Total (PTH): 12.3 mg/dL — ABNORMAL HIGH (ref 8.7–10.2)
PTH: 12 pg/mL — ABNORMAL LOW (ref 15–65)

## 2018-06-14 LAB — PREPARE RBC (CROSSMATCH)

## 2018-06-14 LAB — IRON AND TIBC
Iron: 69 ug/dL (ref 45–182)
Saturation Ratios: 20 % (ref 17.9–39.5)
TIBC: 343 ug/dL (ref 250–450)
UIBC: 274 ug/dL

## 2018-06-14 LAB — ECHOCARDIOGRAM COMPLETE
Height: 72 in
Weight: 2720 oz

## 2018-06-14 LAB — AFP TUMOR MARKER: AFP, Serum, Tumor Marker: 4 ng/mL (ref 0.0–8.3)

## 2018-06-14 LAB — PTH-RELATED PEPTIDE

## 2018-06-14 LAB — PATHOLOGIST SMEAR REVIEW

## 2018-06-14 LAB — STREP PNEUMONIAE URINARY ANTIGEN: Strep Pneumo Urinary Antigen: NEGATIVE

## 2018-06-14 LAB — CK: Total CK: 50 U/L (ref 49–397)

## 2018-06-14 LAB — ABO/RH: ABO/RH(D): A POS

## 2018-06-14 LAB — PROCALCITONIN: Procalcitonin: 7.71 ng/mL

## 2018-06-14 MED ORDER — TRAMADOL HCL 50 MG PO TABS
50.0000 mg | ORAL_TABLET | Freq: Four times a day (QID) | ORAL | Status: DC | PRN
  Administered 2018-06-14 – 2018-06-15 (×2): 50 mg via ORAL
  Filled 2018-06-14 (×2): qty 1

## 2018-06-14 MED ORDER — SODIUM BICARBONATE 650 MG PO TABS
1300.0000 mg | ORAL_TABLET | Freq: Three times a day (TID) | ORAL | Status: DC
  Administered 2018-06-14 – 2018-06-16 (×7): 1300 mg via ORAL
  Filled 2018-06-14 (×7): qty 2

## 2018-06-14 MED ORDER — FOLIC ACID 5 MG/ML IJ SOLN
1.0000 mg | Freq: Every day | INTRAMUSCULAR | Status: DC
  Administered 2018-06-14: 1 mg via INTRAVENOUS
  Filled 2018-06-14 (×4): qty 0.2

## 2018-06-14 MED ORDER — FUROSEMIDE 10 MG/ML IJ SOLN
40.0000 mg | Freq: Four times a day (QID) | INTRAMUSCULAR | Status: AC
  Administered 2018-06-14 – 2018-06-18 (×17): 40 mg via INTRAVENOUS
  Filled 2018-06-14 (×17): qty 4

## 2018-06-14 MED ORDER — SODIUM CHLORIDE 0.9% IV SOLUTION
Freq: Once | INTRAVENOUS | Status: AC
  Administered 2018-06-14: 13:00:00 via INTRAVENOUS

## 2018-06-14 NOTE — Progress Notes (Signed)
Lab requested urine immunofixation order be clarified as to whether it should be random or 24 hr urine collection. Spoke with Dr. Justin Mend, stated random collection. Notified lab personnel. Donavan Foil, RN

## 2018-06-14 NOTE — TOC Initial Note (Signed)
Transition of Care [facility]) - Initial/Assessment Note    Patient Details  Name: [name] MRN: [id number] Date of Birth: 08-08-1960  [facility] of [facility] CM/SW Contact:    Ihor Gully, LCSW Phone Number: 06/14/2018, 4:24 PM  Clinical Narrative:                 Patient currently has no PCP.  He lives alone. Patient states that he has a cane and crutches in regards to DME.  He stated that he has transportation will wants to start using his Medicare benefits that provide transportation.  He stated that his supports are his daughter and  his daughter's mother, [name] (at bedside). Patient states that he was previously a patient of Dr. Everette Rank.  He also indicated that he is in need of eye surgery so that he could see more clearly. Patient was easily distracted and often spoke about unrelated topics.  Patient stated that he wanted to be seen by Dr. Dennard Schaumann at [facility] but had not been able to get in to the practice. Contact was made to [facility] but they are not currently accepting new patient as they are down a provider.  Patient was provided a list of PCP in [facility].     Expected Discharge Plan: [place]     Patient Goals and CMS Choice Patient states their goals for this hospitalization and ongoing recovery are:: To have pain managed and get PCP options.  CMS Medicare.gov Compare Post Acute Care list provided to:: Patient Choice offered to / list presented to : Patient  Expected Discharge Plan and Services Expected Discharge Plan: [place]   Post Acute Care Choice: Home Health                            Prior Living Arrangements/Services                       Activities of Daily Living Home Assistive Devices/Equipment: Cane (specify quad or straight) ADL Screening (condition at time of admission) Patient's cognitive ability adequate to safely complete daily activities?:  Yes Is the patient deaf or have difficulty hearing?: No Does the patient have difficulty seeing, even when wearing glasses/contacts?: No Does the patient have difficulty concentrating, remembering, or making decisions?: No Patient able to express need for assistance with ADLs?: Yes Does the patient have difficulty dressing or bathing?: No Independently performs ADLs?: Yes (appropriate for developmental age) Does the patient have difficulty walking or climbing stairs?: No Weakness of Legs: None Weakness of Arms/Hands: None  Permission Sought/Granted                  Emotional Assessment              Admission diagnosis:  Hypercalcemia [E83.52] Epigastric pain [R10.13] AKI (acute kidney injury) ([place]) [N17.9] Anemia, unspecified type [D64.9] Patient Active Problem List   Diagnosis Date Noted  . AKI (acute kidney injury) ([place]) 06/12/2018  . PNA (pneumonia) 06/12/2018  . Hypercalcemia 06/12/2018  . 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 Pulmonary nodules 06/18/2017  . Rectal bleeding 07/09/2016  . Hemorrhoids 07/09/2016  . Chronic left-sided low back pain with left-sided sciatica 06/12/2016  . Left lumbar radiculopathy 06/12/2016  . Aortic atherosclerosis ([place]) 05/28/2016  . Panlobular emphysema ([place]) 05/28/2016  . Cervical disc disease 05/22/2016  . Kidney stones 05/22/2016  . Tobacco abuse  05/22/2016  . BPH (benign prostatic hyperplasia) 05/22/2016  . H/O: asbestos exposure 05/22/2016  . Substance abuse ([place]) 05/22/2016  . Personality disorder ([place]) 05/22/2016  . Influenza vaccine refused 05/22/2016  . Lumbar stenosis 07/24/2015  . CAROTID ARTERY STENOSIS 04/24/2010  . PVC (premature ventricular contraction) 02/25/2010   PCP:  Patient, No Pcp Per Pharmacy:   [id number] - , [address] [place] [address] Phone: [phone] Fax: [place], [address] [place] [address] Phone: [phone] Fax: [phone]     Social Determinants of Health (SDOH) Interventions    Readmission Risk Interventions 30 Day Unplanned Readmission Risk Score     ED to Hosp-Admission (Current) from 06/12/2018 in [facility]  30 Day Unplanned Readmission Risk Score (%)  22 Filed at 06/14/2018 1600     This score is the patient's risk of an unplanned readmission within 30 days of being discharged (0 -100%). The score is based on dignosis, age, lab data, medications, orders, and past utilization.   Low:  0-14.9   Medium: 15-21.9   High: 22-29.9   Extreme: 30 and above       Readmission Risk Prevention Plan 06/14/2018  Transportation Screening Complete  Social Work Consult for [place] Planning/Counseling Complete  Some recent data might be hidden

## 2018-06-14 NOTE — Progress Notes (Signed)
*  PRELIMINARY RESULTS* Echocardiogram 2D Echocardiogram has been performed.  Leavy Cella 06/14/2018, 3:29 PM

## 2018-06-14 NOTE — Progress Notes (Signed)
PROGRESS NOTE  [name] JQZ:[id number] DOB: [birth year] DOA: 06/12/2018 PCP: Patient, No Pcp Per  Brief History:  58 year old male with a history of COPD, testicular cancer, substance abuse, spinal stenosis and lumbar radiculopathy status post fusion 07/24/2015 [facility]) presenting with abdominal pain radiating to the bilateral flanks as well as nausea without any emesis or diarrhea.  He denies any fevers, chills, chest pain, shortness of breath, coughing. Unfortunately, patient is a difficult historian and tends to be tangential.  Nevertheless, he has also been complaining of worsening lower back pain and leg weakness.  He has not fallen or had any syncope.  He has been taking over-the-counter NSAIDs as well as tramadol.  Patient had MRI of the lumbar spine on 05/06/2018 which showed abnormal right prevertebral and epidural soft tissue at L2-3 concerning for tumor infection.  There is also right L3 nerve impingement.  There is moderate L4-5 canal stenosis with moderate to severe L2-3 narrowing.  The patient subsequently had L2 nerve block performed on 05/27/2018. Upon presentation, the patient was noted to have hypercalcemia with a corrected calcium of 13.1.  The patient was started on IV fluids with some improvement of his serum calcium.  Assessment/Plan: AKI -Secondary to NSAIDs as well as hypercalcemia -Renal ultrasound -SPEP/UPEP -Immunofixation -baseline creatinine ~1.0 -urine protein/creatinine ratio  Hypercalcemia -Intact PTH -Concerned about underlying malignancy -Continue IV fluids as serum calcium is improving  Lumbar radiculopathy -05/06/2018 MRI L-spine--diffusely abnormal bone marrow signal concerning for myeloproliferative disorder versus infiltrative metastatic disease.  Abnormal periventricular tissue as discussed above at L2-3 -I am trying to contact to the patient's neurosurgeon, Dr. Venetia Constable for recommendations  Bilateral pleural  effusions -Echocardiogram -Urine protein creatinine ratio -Stable on room air  COPD -Stable on room air -Patient states that he quit smoking 1 month ago  Pulmonary opacity -CT abdomen and pelvis showed consolidation in the posterior left lower lobe -Patient has no fever or shortness of breath -Viral respiratory panel shows rhinovirus -Check procalcitonin -CT chest  Abdominal Pain -improving after BM -3/14 CT abd--mid sigmoid wall thickening wihtout surrounding inflammation--?muscular hypertrophy -GI consult  Macrocytic anemia -M22--633 -folic HLKT--6.2 -iron saturation 35%, ferritin 62 -transfuse 2 units    Disposition Plan:   Not stable for d/c Family Communication:  No Family at bedside  Consultants:  Renal/heme/onc  Code Status:  FULL /  DVT Prophylaxis:  [place] Heparin    Procedures: As Listed in Progress Note Above  Antibiotics:       Subjective: Pt c/o back pain and leg weakness. Denies cp, sob, n/v/d.  abd pain better after bm.  No hematochezia/melena No headache.    Objective: Vitals:   06/13/18 0654 06/13/18 1300 06/13/18 2116 06/14/18 0527  BP: 123/63 130/64 (!) 125/51 136/64  Pulse: (!) 109 93 (!) 110 (!) 104  Resp: 20 20    Temp: 98 F (36.7 C) 98.3 F (36.8 C) 98.2 F (36.8 C) 98.2 F (36.8 C)  TempSrc: Oral Oral Oral Oral  SpO2: 100% 99% 97% 99%  Weight:      Height:        Intake/Output Summary (Last 24 hours) at 06/14/2018 5638 Last data filed at 06/13/2018 1700 Gross per 24 hour  Intake 480 ml  Output 925 ml  Net -445 ml   Weight change:  Exam:   General:  Pt is alert, follows commands appropriately, not in acute distress  HEENT: No icterus, No thrush, No neck mass, [place]/AT  Cardiovascular:  RRR, S1/S2, no rubs, no gallops  Respiratory: CTA bilaterally, no wheezing, no crackles, no rhonchi  Abdomen: Soft/+BS, non tender, non distended, no guarding  Extremities: No edema, No lymphangitis, No petechiae, No rashes, no  synovitis  Neuro:  CN II-XII intact, strength 4-/5 in RUE, RLE, strength 4-/5 LUE, LLE; sensation intact bilateral; no dysmetria; babinski equivocal     Data Reviewed: I have personally reviewed following labs and imaging studies Basic Metabolic Panel: Recent Labs  Lab 06/12/18 1325 06/12/18 2240 06/13/18 0639 06/14/18 0548  NA 132* 131* 134* 135  K 4.3 4.4 4.5 4.9  CL 98 102 105 108  CO2 16* 16* 16* 16*  GLUCOSE 101* 99 83 82  BUN 95* 85* 85* 71*  CREATININE 6.14* 5.69* 5.48* 5.14*  CALCIUM 13.1* 12.2* 12.3* 12.2*   Liver Function Tests: Recent Labs  Lab 06/12/18 1325  AST 20  ALT 23  ALKPHOS 99  BILITOT 0.6  PROT 7.8  ALBUMIN 4.4   Recent Labs  Lab 06/12/18 1325  LIPASE 33   No results for input(s): AMMONIA in the last 168 hours. Coagulation Profile: No results for input(s): INR, PROTIME in the last 168 hours. CBC: Recent Labs  Lab 06/12/18 1325 06/12/18 2240 06/13/18 0639 06/14/18 0548  WBC 10.6*  --  9.6 10.7*  NEUTROABS  --  5.4 3.6 4.2  HGB 8.3*  --  7.3* 7.0*  HCT 25.6*  --  22.9* 22.5*  MCV 107.6*  --  111.2* 111.4*  PLT 164  --  150 165   Cardiac Enzymes: No results for input(s): CKTOTAL, CKMB, CKMBINDEX, TROPONINI in the last 168 hours. BNP: Invalid input(s): POCBNP CBG: No results for input(s): GLUCAP in the last 168 hours. HbA1C: No results for input(s): HGBA1C in the last 72 hours. Urine analysis:    Component Value Date/Time   COLORURINE YELLOW 06/12/2018 1356   APPEARANCEUR CLEAR 06/12/2018 1356   LABSPEC 1.012 06/12/2018 1356   PHURINE 5.0 06/12/2018 1356   GLUCOSEU NEGATIVE 06/12/2018 1356   HGBUR MODERATE (A) 06/12/2018 1356   BILIRUBINUR NEGATIVE 06/12/2018 1356   [place] 06/12/2018 1356   PROTEINUR 30 (A) 06/12/2018 1356   UROBILINOGEN 0.2 02/09/2008 2042   NITRITE NEGATIVE 06/12/2018 [place] 06/12/2018 1356   Sepsis Labs: @LABRCNTIP (procalcitonin:4,lacticidven:4) ) Recent Results  (from the past 240 hour(s))  Culture, blood (routine x 2) Call MD if unable to obtain prior to antibiotics being given     Status: None (Preliminary result)   Collection Time: 06/12/18 10:41 PM  Result Value Ref Range Status   Specimen Description BLOOD RIGHT HAND  Final   Special Requests   Final    BOTTLES DRAWN AEROBIC AND ANAEROBIC Blood Culture adequate volume   Culture   Final    NO GROWTH 2 DAYS Performed at [facility], [address] [place], [place] [address]    Report Status PENDING  Incomplete  Respiratory Panel by PCR     Status: Abnormal   Collection Time: 06/12/18 10:44 PM  Result Value Ref Range Status   Adenovirus NOT DETECTED NOT DETECTED Final   Coronavirus 229E NOT DETECTED NOT DETECTED Final    Comment: (NOTE) The Coronavirus on the Respiratory Panel, DOES NOT test for the novel  Coronavirus (2019 nCoV)    Coronavirus HKU1 NOT DETECTED NOT DETECTED Final   Coronavirus NL63 NOT DETECTED NOT DETECTED Final   Coronavirus OC43 NOT DETECTED NOT DETECTED Final   Metapneumovirus NOT DETECTED NOT DETECTED Final  Rhinovirus / Enterovirus DETECTED (A) NOT DETECTED Final   Influenza A NOT DETECTED NOT DETECTED Final   Influenza B NOT DETECTED NOT DETECTED Final   Parainfluenza Virus 1 NOT DETECTED NOT DETECTED Final   Parainfluenza Virus 2 NOT DETECTED NOT DETECTED Final   Parainfluenza Virus 3 NOT DETECTED NOT DETECTED Final   Parainfluenza Virus 4 NOT DETECTED NOT DETECTED Final   Respiratory Syncytial Virus NOT DETECTED NOT DETECTED Final   Bordetella pertussis NOT DETECTED NOT DETECTED Final   Chlamydophila pneumoniae NOT DETECTED NOT DETECTED Final   Mycoplasma pneumoniae NOT DETECTED NOT DETECTED Final    Comment: Performed at [facility], [place] [address], [place], [place] [address]  Culture, blood (routine x 2) Call MD if unable to obtain prior to antibiotics being given     Status: None (Preliminary result)   Collection Time: 06/12/18 10:58 PM  Result  Value Ref Range Status   Specimen Description BLOOD LEFT ARM  Final   Special Requests   Final    BOTTLES DRAWN AEROBIC AND ANAEROBIC Blood Culture adequate volume   Culture   Final    NO GROWTH 2 DAYS Performed at [facility], [address], [place], [place] [address]    Report Status PENDING  Incomplete  MRSA PCR Screening     Status: None   Collection Time: 06/13/18 12:20 AM  Result Value Ref Range Status   MRSA by PCR NEGATIVE NEGATIVE Final    Comment:        The GeneXpert MRSA Assay (FDA approved for NASAL specimens only), is one component of a comprehensive MRSA colonization surveillance program. It is not intended to diagnose MRSA infection nor to guide or monitor treatment for MRSA infections. Performed at [facility], [address], [place], [place] [address]      Scheduled Meds: . feeding supplement (ENSURE ENLIVE)  237 mL Oral BID BM  . lidocaine  1 patch Transdermal Q24H   Continuous Infusions: . sodium chloride 100 mL/hr at 06/14/18 0903  . azithromycin 500 mg (06/13/18 2105)  . cefTRIAXone (ROCEPHIN)  IV 200 mL/hr at 06/13/18 2025    Procedures/Studies: Ct Abdomen Pelvis Wo Contrast  Result Date: 06/12/2018 CLINICAL DATA:  Abdominal pain and fever EXAM: CT ABDOMEN AND PELVIS WITHOUT CONTRAST TECHNIQUE: Multidetector CT imaging of the abdomen and pelvis was performed following the standard protocol without IV contrast. Oral contrast was administered. COMPARISON:  Aug 12, 2012 FINDINGS: Lower chest: There are free-flowing pleural effusions bilaterally with bibasilar atelectasis. There is questionable mild consolidation in the posterior left lung base as well. Hepatobiliary: No focal liver lesions are appreciable on this noncontrast enhanced study. The gallbladder wall is not appreciably thickened. There is no biliary duct dilatation. Pancreas: No pancreatic mass or inflammatory focus. Spleen: No splenic lesions are evident. Adrenals/Urinary Tract: Adrenals  bilaterally appear unremarkable. Kidneys bilaterally show no evident mass or hydronephrosis on either side. There is no appreciable renal or ureteral calculus on either side. Urinary bladder is midline with wall thickness within normal limits. Stomach/Bowel: There are sigmoid diverticula without overt diverticulitis. There is wall thickening in portions of the mid sigmoid colon without surrounding inflammation. Suspect muscular hypertrophy in this area from chronic diverticulosis. No bowel wall thickening is noted elsewhere. No bowel obstruction appreciable. No free air or portal venous air. Vascular/Lymphatic: There is aortic and bilateral iliac artery atherosclerosis. No aneurysm evident. There is no appreciable adenopathy in the abdomen or pelvis. Reproductive: Prostate and seminal vesicles appear normal in  size and contour. There is no appreciable pelvic mass. Other: Appendix appears normal. No abscess or ascites is evident in the abdomen or pelvis. Musculoskeletal: There is extensive postoperative change from L1-L4. There is arthropathy throughout the lumbar region. There are no blastic or lytic bone lesions. There is no intramuscular or abdominal wall lesion evident. IMPRESSION: 1. Moderate free-flowing pleural effusions bilaterally with bibasilar atelectasis. Question a degree of superimposed pneumonia in the posterior left base. 2. Wall thickening in the mid sigmoid colon, likely due to muscular hypertrophy from chronic diverticulosis. Earliest changes of diverticulitis in this area are difficult to entirely exclude. No overt diverticulitis is evident. 3. No bowel obstruction. Appendix appears normal. No evident abscess in the abdomen or pelvis. 4. No evident renal or ureteral calculus. No evident hydronephrosis on either side. 5.  Aortoiliac atherosclerosis. 6.  Extensive postoperative change throughout the lumbar spine. Electronically Signed   By: Lowella Grip III M.D.   On: 06/12/2018 17:14   Dg  Chest 2 View  Result Date: 06/12/2018 CLINICAL DATA:  Stomach pain for a week. No vomiting or diarrhea. Fever. EXAM: CHEST - 2 VIEW COMPARISON:  CT chest 05/28/2016. Chest 11/04/2010 FINDINGS: Normal heart size and pulmonary vascularity. Small left pleural effusion with infiltration in the left lung base. This suggests pneumonia. Right lung is clear. No pneumothorax. Mediastinal contours appear intact. IMPRESSION: Small left pleural effusion with infiltration in the left lung base suggesting pneumonia. Electronically Signed   By: Lucienne Capers M.D.   On: 06/12/2018 21:18   Dg Inject Diag/thera/inc Needle/cath/plc Epi/lumb/sac W/img  Result Date: 05/27/2018 CLINICAL DATA:  Previous fusion surgery. Back and right lower extremity pain. EXAM: SELECTIVE NERVE ROOT BLOCK AND TRANSFORAMINAL EPIDURAL STEROID INJECTION UNDER FLUOROSCOPY FLUOROSCOPY TIME:  43 seconds; 35 uGym2 DAP TECHNIQUE: The procedure, risks (including but not limited to bleeding, infection, organ damage ), benefits, and alternatives were explained to the patient. Questions regarding the procedure were encouraged and answered. The patient understands and consents to the procedure. An appropriate skin entry site was determined under fluoroscopy. Operator donned sterile gloves and mask. Site was marked, prepped with Betadine, draped in usual sterile fashion, infiltrated locally with 1% lidocaine. A 22 gauge spinal needle was advanced to the superior ventral margin of the right L2-3 neural foramen. Diagnostic injection of 2 ml Omnipaque 180 showed partial outlining of the exiting nerve root as well as minimal epidural extension of contrast, with no intravascular or subarachnoid component. 120 mg Depo-Medrol in 3 ml lidocaine 1% was administered. The patient tolerated procedure well, with no immediate complication. IMPRESSION: 1. Technically successful right L2 selective nerve root block and transforaminal epidural steroid injection Electronically  Signed   By: Lucrezia Europe M.D.   On: 05/27/2018 15:28    Orson Eva, DO  [facility] Pager [phone]  If 7PM-7AM, please contact night-coverage [email or web address] Password [email or web address] 06/14/2018, 9:07 AM   LOS: 2 days

## 2018-06-14 NOTE — Consult Note (Signed)
Referring Provider: No ref. provider found Primary Care Physician:  Patient, No Pcp Per Primary Nephrologist:    Reason for Consultation:     HPI: This is a very pleasant 58 year old gentleman.  He does have a history of chronic back pain and has been prescribed oxycodone by Dr. Joya Salm neurosurgery in [place] for over 10 years.  He has been complaining of worsening back pain after fall in 2019.  He visits the emergency room on a frequent basis.  Most of his complaints revolve around back pain.  On June 12, 2018, he presented with abdominal pain flank pain nausea  He had a history of a testicular cancer and orchiectomy performed in 2003.  Dates he has had no problems since then he received no chemotherapy or radiation treatment.  He has been seen at [facility] urology for benign prostatic hypertrophy he also describes a history of renal calculi and what sounds like lithotripsy performed in the past.  He denies the use of over-the-counter medications such as ibuprofen Motrin Aleve or Advil.  He states he is allergic to the nonsteroidal anti-inflammatory drugs.  He does not have a family history of renal disease. About 12 months ago in February 2019 he had lab work that showed normal renal function.  He presented to the emergency room 06/12/2018 with a serum calcium of 13.1 and a creatinine of 6.14.  He is a lifelong smoker and has a history of emphysema he has been evaluated -by with pulmonary 06/18/2017.  He states he stopped smoking cigarettes about 3 months ago.  In April 2019 he was seen by neurology for left cubital tunnel syndrome.  He was recommended conservative therapy.  He was seen by Dr. Manus Rudd gastroenterology for hemorrhoids in May 2019.  In October 2019 he fell deck at home and presented to the emergency room complaining of shoulder pain.  He presented back to the emergency room in January 2020 complaining of general body aches since falling in October 2019.   He underwent x-ray  evaluation of his lumbar spine in January which was negative for fracture.  Shoulder x-ray in October 2019 that showed calcific supraspinatus tendinosis and acromioclavicular glenohumeral degenerative changes.  He came back to the emergency room and April 15, 2018 with pain in the calf although his history had changed to a fall in March 20, 2018.  Patient presented back to the emergency room 05/29/2018 complaining of lower back pain.  MRI performed in 05/06/2018 showed no fracture with diffusely abnormal bone marrow signal seen with reactive myeloproliferative disease or infiltrative metastatic disease.   On June 12, 2018 he came back to the emergency room with abdominal pain flank pain nausea.His creatinine and May 28, 2017 was 1.07.  I have no other serum creatinines until it was checked 06/12/2018 and found to be 6.14.  His calcium level was normal on May 28, 2017 but on June 12, 2026 increased to 13.1.  CT scan of his abdomen field pleural effusions bilaterally with bibasilar atelectasis, wall thickening of the mid sigmoid colon likely due to muscular hypertrophy and chronic diverticulosis, no bowel obstruction with normal-appearing appendix no evident renal or ureteral calculus no evidence of hydronephrosis, aortoiliac atherosclerosis and extensive postoperative changes in his lumbar spine.  Chest x-ray was pertinent for left pleural effusion and infiltration of the left lung base.  Blood pressure 136/64 pulse 104 temperature 98.2 O2 sats 99% room air.   Sodium 135 potassium 4.9 chloride 108 CO2 16 glucose 182 BUN 731 creatinine 5.14  calcium 12.2 WBC 10.7 hemoglobin 7.0 platelets 165.   Medications azithromycin 500 mg every 24 hours, Rocephin 1 g every 24 hours sodium chloride 100 cc an hour.   Past Medical History:  Diagnosis Date  . Allergy   . Arthritis    neck and back  . Blood transfusion without reported diagnosis   . BPH (benign prostatic hyperplasia)   . Cancer [facility]) 2004    testicle  . Chronic kidney disease    kidney stones  . Left lumbar radiculopathy 06/12/2016  . 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 Medical history non-contributory    Pt has scattered thoughts and uncertain of past medical history  . 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 Stones, urinary tract   . Substance abuse ([place])    prescribed oxydcodone- 10 years    Past Surgical History:  Procedure Laterality Date  . BACK SURGERY     x5  . CERVICAL DISC SURGERY     x2  . COLONOSCOPY WITH PROPOFOL N/A 08/11/2016   Procedure: COLONOSCOPY WITH PROPOFOL;  Surgeon: [name], MD;  Location: [facility];  Service: Endoscopy;  Laterality: N/A;  130   . POLYPECTOMY  08/11/2016   Procedure: POLYPECTOMY;  Surgeon: [name], MD;  Location: [facility];  Service: Endoscopy;;  colon  . testicular cancer  2004  . TONSILLECTOMY      Prior to Admission medications   Medication Sig Start Date End Date Taking? Authorizing Provider  traMADol (ULTRAM) 50 MG tablet Take 1 tablet (50 mg total) by mouth every 6 (six) hours as needed. 05/29/18  Yes Malvin Johns, MD  lidocaine (LIDODERM) 5 % Place 1 patch onto the skin daily. Remove & Discard patch within 12 hours or as directed by MD Patient not taking: Reported on 06/12/2018 03/31/18   Volanda Napoleon, PA-C  OVER THE COUNTER MEDICATION [facility] as needed    [provider]    Current Facility-Administered Medications  Medication Dose Route Frequency Provider Last Rate Last Dose  . 0.9 %  sodium chloride infusion (Manually program via Guardrails IV Fluids)   Intravenous Once Tat, David, MD      . 0.9 %  sodium chloride infusion   Intravenous Continuous Phillips Grout, MD 100 mL/hr at 06/14/18 0903    . azithromycin (ZITHROMAX) 500 mg in sodium chloride 0.9 % 250 mL IVPB  500 mg Intravenous Q24H Derrill Kay A, MD 250 mL/hr at 06/13/18 2105 500 mg at 06/13/18 2105  . cefTRIAXone (ROCEPHIN) 1 g in sodium chloride 0.9 % 100 mL  IVPB  1 g Intravenous Q24H Derrill Kay A, MD 200 mL/hr at 06/13/18 2025    . feeding supplement (ENSURE ENLIVE) (ENSURE ENLIVE) liquid 237 mL  237 mL Oral BID BM Phillips Grout, MD   237 mL at 06/14/18 0902  . lidocaine (LIDODERM) 5 % 1 patch  1 patch Transdermal Q24H Hollace Hayward K, NP   1 patch at 06/14/18 0020  . traMADol (ULTRAM) tablet 50 mg  50 mg Oral BID PRN Cristal Deer, MD   50 mg at 06/13/18 1500    Allergies as of 06/12/2018 - Review Complete 06/12/2018  Allergen Reaction Noted  . Acetaminophen Nausea Only and Other (See Comments) 01/17/2015  . Cymbalta [duloxetine hcl] Swelling 07/09/2016  . Diclofenac sodium Swelling 02/25/2010  . Imodium [loperamide] Swelling 01/17/2015  . Lyrica [pregabalin] Swelling 06/18/2016  . Morphine Other (See Comments) 02/25/2010  . Vioxx [rofecoxib] Swelling 07/18/2015  .  Aleve [naproxen] Swelling 07/18/2015    Family History  Problem Relation Age of Onset  . COPD Mother   . Heart disease Mother   . Other Father        Never knew his father.  . Thyroid disease Sister   . Arthritis Sister   . Heart disease Sister        bypass  . Colon cancer Neg Hx     Social History   Socioeconomic History  . Marital status: Single    Spouse name: Not on file  . Number of children: 1  . Years of education: 45  . Highest education level: Not on file  Occupational History  . Occupation: retired    Comment: Psychologist, prison and probation services  . Occupation: disabled  Social Needs  . Financial resource strain: Not on file  . Food insecurity:    Worry: Not on file    Inability: Not on file  . Transportation needs:    Medical: Not on file    Non-medical: Not on file  Tobacco Use  . Smoking status: Current Every Day Smoker    Packs/day: 1.00    Years: 40.00    Pack years: 40.00    Types: Cigarettes    Start date: 03/31/1974  . Smokeless tobacco: Never Used  Substance and Sexual Activity  . Alcohol use: Yes    Comment: Occasional  . Drug use: No    Types:  Cocaine    Comment: Remote hx of cocaine, quit 1989  . Sexual activity: Yes  Lifestyle  . Physical activity:    Days per week: Not on file    Minutes per session: Not on file  . Stress: Not on file  Relationships  . Social connections:    Talks on phone: Not on file    Gets together: Not on file    Attends religious service: Not on file    Active member of club or organization: Not on file    Attends meetings of clubs or organizations: Not on file    Relationship status: Not on file  . Intimate partner violence:    Fear of current or ex partner: Not on file    Emotionally abused: Not on file    Physically abused: Not on file    Forced sexual activity: Not on file  Other Topics Concern  . Not on file  Social History Narrative   [employer] for 12 years   [employer] for 35 years      Never married   One daughter      Lives alone   Retail banker   Right-handed   Occasional caffeine use          Review of Systems: Gen: Denies weight loss.  No fever sweats chills HEENT: No visual complaints, No history of Retinopathy. Normal external appearance No Epistaxis or Sore throat. No sinusitis.   CV: Denies chest pain, angina, palpitations, syncope, orthopnea, PND, peripheral edema, and claudication. Resp: Denies dyspnea at rest, dyspnea with exercise, cough, sputum, wheezing, coughing up blood, and pleurisy. GI: Abdominal discomfort subdiaphragmatic.  No change in bowel habit blood in stools no hemoptysis.  No history of alcohol abuse GU : History of renal calculi, nocturia and prostatism MS: Chronic back pain exacerbation over the past 3 to 4 months with pain requiring narcotics.  Secondly patient has been on narcotic medications for many years Derm: Denies rash, itching, dry skin, hives, moles, warts, or unhealing ulcers.  Psych:  Denies depression, anxiety, memory loss, suicidal ideation, hallucinations, paranoia, and confusion. Heme: Denies bruising, bleeding,  and enlarged lymph nodes. Neuro: History of paresthesias in hand Endocrine No DM.  No Thyroid disease.  No Adrenal disease.  Physical Exam: Vital signs in last 24 hours: Temp:  [98.2 F (36.8 C)-98.3 F (36.8 C)] 98.2 F (36.8 C) (03/16 0527) Pulse Rate:  [93-110] 104 (03/16 0527) Resp:  [20] 20 (03/15 1300) BP: (125-136)/(51-64) 136/64 (03/16 0527) SpO2:  [97 %-99 %] 99 % (03/16 0527) Last BM Date: 06/13/18 General:   Alert,  Well-developed, well-nourished, pleasant and cooperative in NAD Head:  Normocephalic and atraumatic. Eyes:  Sclera clear, no icterus.   Conjunctiva pink. Ears:  Normal auditory acuity. Nose:  No deformity, discharge,  or lesions. Mouth:  No deformity or lesions, dentition normal. Neck:  Supple; no masses or thyromegaly. JVP not elevated Lungs: Crackles diminished breath sounds with rhonchi heard on the left.   No wheezes, crackles, or rhonchi. No acute distress. Heart:  Regular rate and rhythm; no murmurs, clicks, rubs,  or gallops. Abdomen:  Soft, nontender and nondistended. No masses, hepatosplenomegaly or hernias noted. Normal bowel sounds, without guarding, and without rebound.   Msk:  Symmetrical without gross deformities. Normal posture. Pulses:  No carotid, renal, femoral bruits. DP and PT symmetrical and equal Extremities:  Without clubbing or edema. Neurologic:  Alert and  oriented x4;  grossly normal neurologically. Skin:  Intact without significant lesions or rashes. Cervical Nodes:  No significant cervical adenopathy. Psych:  Alert and cooperative. Normal mood and affect.  Intake/Output from previous day: 03/15 0701 - 03/16 0700 In: 720 [P.O.:720] Out: 925 [Urine:925] Intake/Output this shift: Total I/O In: -  Out: 700 [Urine:700]  Lab Results: Recent Labs    06/12/18 1325 06/13/18 0639 06/14/18 0548  WBC 10.6* 9.6 10.7*  HGB 8.3* 7.3* 7.0*  HCT 25.6* 22.9* 22.5*  PLT 164 150 165   BMET Recent Labs    06/12/18 2240  06/13/18 0639 06/14/18 0548  NA 131* 134* 135  K 4.4 4.5 4.9  CL 102 105 108  CO2 16* 16* 16*  GLUCOSE 99 83 82  BUN 85* 85* 71*  CREATININE 5.69* 5.48* 5.14*  CALCIUM 12.2* 12.3* 12.2*   LFT Recent Labs    06/12/18 1325  PROT 7.8  ALBUMIN 4.4  AST 20  ALT 23  ALKPHOS 99  BILITOT 0.6   PT/INR No results for input(s): LABPROT, INR in the last 72 hours. Hepatitis Panel No results for input(s): HEPBSAG, HCVAB, HEPAIGM, HEPBIGM in the last 72 hours.  Studies/Results: Ct Abdomen Pelvis Wo Contrast  Result Date: 06/12/2018 CLINICAL DATA:  Abdominal pain and fever EXAM: CT ABDOMEN AND PELVIS WITHOUT CONTRAST TECHNIQUE: Multidetector CT imaging of the abdomen and pelvis was performed following the standard protocol without IV contrast. Oral contrast was administered. COMPARISON:  Aug 12, 2012 FINDINGS: Lower chest: There are free-flowing pleural effusions bilaterally with bibasilar atelectasis. There is questionable mild consolidation in the posterior left lung base as well. Hepatobiliary: No focal liver lesions are appreciable on this noncontrast enhanced study. The gallbladder wall is not appreciably thickened. There is no biliary duct dilatation. Pancreas: No pancreatic mass or inflammatory focus. Spleen: No splenic lesions are evident. Adrenals/Urinary Tract: Adrenals bilaterally appear unremarkable. Kidneys bilaterally show no evident mass or hydronephrosis on either side. There is no appreciable renal or ureteral calculus on either side. Urinary bladder is midline with wall thickness within normal limits. Stomach/Bowel: There are sigmoid diverticula without  overt diverticulitis. There is wall thickening in portions of the mid sigmoid colon without surrounding inflammation. Suspect muscular hypertrophy in this area from chronic diverticulosis. No bowel wall thickening is noted elsewhere. No bowel obstruction appreciable. No free air or portal venous air. Vascular/Lymphatic: There is  aortic and bilateral iliac artery atherosclerosis. No aneurysm evident. There is no appreciable adenopathy in the abdomen or pelvis. Reproductive: Prostate and seminal vesicles appear normal in size and contour. There is no appreciable pelvic mass. Other: Appendix appears normal. No abscess or ascites is evident in the abdomen or pelvis. Musculoskeletal: There is extensive postoperative change from L1-L4. There is arthropathy throughout the lumbar region. There are no blastic or lytic bone lesions. There is no intramuscular or abdominal wall lesion evident. IMPRESSION: 1. Moderate free-flowing pleural effusions bilaterally with bibasilar atelectasis. Question a degree of superimposed pneumonia in the posterior left base. 2. Wall thickening in the mid sigmoid colon, likely due to muscular hypertrophy from chronic diverticulosis. Earliest changes of diverticulitis in this area are difficult to entirely exclude. No overt diverticulitis is evident. 3. No bowel obstruction. Appendix appears normal. No evident abscess in the abdomen or pelvis. 4. No evident renal or ureteral calculus. No evident hydronephrosis on either side. 5.  Aortoiliac atherosclerosis. 6.  Extensive postoperative change throughout the lumbar spine. Electronically Signed   By: Lowella Grip III M.D.   On: 06/12/2018 17:14   Dg Chest 2 View  Result Date: 06/12/2018 CLINICAL DATA:  Stomach pain for a week. No vomiting or diarrhea. Fever. EXAM: CHEST - 2 VIEW COMPARISON:  CT chest 05/28/2016. Chest 11/04/2010 FINDINGS: Normal heart size and pulmonary vascularity. Small left pleural effusion with infiltration in the left lung base. This suggests pneumonia. Right lung is clear. No pneumothorax. Mediastinal contours appear intact. IMPRESSION: Small left pleural effusion with infiltration in the left lung base suggesting pneumonia. Electronically Signed   By: Lucienne Capers M.D.   On: 06/12/2018 21:18    Assessment/Plan:  Acute kidney injury.   Historical creatinines show that his creatinine was normal back in February 2019.  Urine sodium was 42.  Urinalysis showed no WBCs but moderate blood specific gravity 1012 with no bacteriuria.  CT scan of abdomen did not reveal any evidence of hydronephrosis.  Hypercalcemia was noted.  We shall manage the hypercalcemia with saline diuresis and hope that the renal function continues to improve.  There is a mild amount of proteinuria 30 mg/dL will check protein creatinine ratio.  Will continue to avoid nonsteroidal anti-inflammatory drugs ACE inhibitors Cox 2 inhibitors ARB's and IV contrast.  Hypercalcemia new onset.  Differential would include multiple myeloma.  Will check SPEP and immunofixation.  Will check PTH although would expect this to be suppressed.  Squamous cell lung cancer could also present this way with widespread metastases to the bone.  However MRI suggests more of a bone marrow infiltrative process.  This would make this lower on the list.  Treatment would be saline diuresis.  Will increase rate of normal saline to 125 cc an hour.  We will also start IV Lasix at a dose of 40 mg every 6 hours.  Would recommend oncology evaluation.  Anemia.  We will check iron studies and peripheral blood smear.  Suspect this is related some myeloid process.  This is also compounded by his renal failure  Hyponatremia.  This could be falsely low secondary to paraproteinemia.  Metabolic acidosis we will start sodium bicarbonate 650 mg 2 tablets 3 times daily.  There  does not appear to be any hyperchloridemia and suspect this is a gap related metabolic acidosis related to renal failure not secondary to renal tubular acidosis.   Urine  LOS: 2 Sherril Croon @TODAY @9 :27 AM

## 2018-06-14 NOTE — Progress Notes (Signed)
MRI ordered, patient stated would prefer to "have it done tomorrow, i'm hurting too bad to have it done today". Daughter [name] here now requesting update from MD if possible. Notified Dr. Carles Collet. MRI tech states test needs to be completed today due to schedule tomorrow. Spoke with patient regarding MRI ordered, stated "I can try and go but if they can't keep my comfortable they'll have to stop it." Dr. Wallace Cullens at bedside at this time for consult. Donavan Foil, RN

## 2018-06-14 NOTE — Progress Notes (Signed)
Patient agreed to have MRI, MD present when MRI tech arrived to pick up patient. Second unit of PRBCs to be transfused on return from MRI. Donavan Foil, RN

## 2018-06-14 NOTE — Progress Notes (Signed)
Patient requested "something else for pain" rated upper back pain 10/10 on 0-10 pain scale. Tramadol received at 1159 this am, not yet time for next dose. Text paged MD to notify of request for something else for pain if possible. Second unit of PRBCs started on return from MRI this evening. Daughter remains at bedside. Donavan Foil, RN

## 2018-06-14 NOTE — Care Management Important Message (Signed)
Important Message  Patient Details  Name: [name] MRN: [id number] Date of Birth: 29-Sep-1960   Medicare Important Message Given:  Yes    Tommy Medal 06/14/2018, 3:36 PM

## 2018-06-14 NOTE — Consult Note (Signed)
[facility] Consultation Oncology  Name: [name]      MRN: [id number]    Location: A327/A327-01  Date: 06/14/2018 Time:4:58 PM   REFERRING PHYSICIAN: Dr. Carles Collet  REASON FOR CONSULT: Hypercalcemia   DIAGNOSIS: Hypercalcemia under work-up, renal insufficiency.  HISTORY OF PRESENT ILLNESS: [name] is a 58 year old African-American male who is seen in consultation at the request of Dr. Carles Collet for further work-up and management of hypercalcemia.  This patient came with low back pain to the emergency room on Saturday and was found to have a calcium of 13.1 and a creatinine of 6.14.  His hemoglobin on admission was 8.3.  White count was 10.6.  He was treated with IV fluids.  He also received antibiotics with azithromycin and ceftriaxone.  He reports that he has been taking vitamin D3 until December 2019.  He denies taking any calcium supplements.  He denies any new onset pains.  He has back problems for many years and had surgery on the lower back in the past.  He is also reportedly received multiple epidural injections.  He lives at home by himself.  His daughter is by the side of the bed today.  He reports losing about 30 pounds in the last 6 months.  Denies any fevers or night sweats.  He gives a history of left orchiectomy done by Dr. [place] Pink in 2004.  Patient completely not sure whether it was malignant.  He did not have to receive any chemotherapy or radiation.  PAST MEDICAL HISTORY:   Past Medical History:  Diagnosis Date  . Allergy   . Arthritis    neck and back  . Blood transfusion without reported diagnosis   . BPH (benign prostatic hyperplasia)   . Cancer [facility]) 2004   testicle  . Chronic kidney disease    kidney stones  . Left lumbar radiculopathy 06/12/2016  . 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 Medical history non-contributory    Pt has scattered thoughts and uncertain of past medical history  . 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 Stones, urinary tract   . Substance  abuse ([place])    prescribed oxydcodone- 10 years    ALLERGIES: Allergies  Allergen Reactions  . Acetaminophen Nausea Only and Other (See Comments)    Elevated liver enzymes  . Cymbalta [Duloxetine Hcl] Swelling    Facial swelling  . Diclofenac Sodium Swelling       . Imodium [Loperamide] Swelling    facial  . Lyrica [Pregabalin] Swelling  . Morphine Other (See Comments)    Bradycardia   . Vioxx [Rofecoxib] Swelling  . Aleve [Naproxen] Swelling    eye      MEDICATIONS: I have reviewed the patient's current medications.     PAST SURGICAL HISTORY Past Surgical History:  Procedure Laterality Date  . BACK SURGERY     x5  . CERVICAL DISC SURGERY     x2  . COLONOSCOPY WITH PROPOFOL N/A 08/11/2016   Procedure: COLONOSCOPY WITH PROPOFOL;  Surgeon: [name], MD;  Location: [facility] ENDO SUITE;  Service: Endoscopy;  Laterality: N/A;  130   . POLYPECTOMY  08/11/2016   Procedure: POLYPECTOMY;  Surgeon: [name], MD;  Location: [facility] ENDO SUITE;  Service: Endoscopy;;  colon  . testicular cancer  2004  . TONSILLECTOMY      FAMILY HISTORY: Family History  Problem Relation Age of Onset  . COPD Mother   . Heart disease Mother   . Other Father  Never knew his father.  . Thyroid disease Sister   . Arthritis Sister   . Heart disease Sister        bypass  . Colon cancer Neg Hx     SOCIAL HISTORY:  reports that he has been smoking cigarettes. He started smoking about 44 years ago. He has a 40.00 pack-year smoking history. He has never used smokeless tobacco. He reports current alcohol use. He reports that he does not use drugs.  PERFORMANCE STATUS: The patient's performance status is 1 - Symptomatic but completely ambulatory  PHYSICAL EXAM: Most Recent Vital Signs: Blood pressure 114/63, pulse (!) 102, temperature 97.9 F (36.6 C), temperature source Oral, resp. rate 18, height 6' (1.829 m), weight 170 lb (77.1 kg), SpO2 100 %. BP 114/63 (BP Location: Right Arm)    Pulse (!) 102   Temp 97.9 F (36.6 C) (Oral)   Resp 18   Ht 6' (1.829 m)   Wt 170 lb (77.1 kg)   SpO2 100%   BMI 23.06 kg/m  General appearance: alert, cooperative and appears stated age Neck: no adenopathy, supple, symmetrical, trachea midline and thyroid not enlarged, symmetric, no tenderness/mass/nodules Back: no skin lesions, erythema, or scars Lungs: clear to auscultation bilaterally Heart: regular rate and rhythm Abdomen: soft, non-tender; bowel sounds normal; no masses,  no organomegaly Extremities: extremities normal, atraumatic, no cyanosis or edema Skin: Skin color, texture, turgor normal. No rashes or lesions Lymph nodes: Cervical, supraclavicular, and axillary nodes normal. Neurologic: Grossly normal  LABORATORY DATA:  Results for orders placed or performed during the hospital encounter of 06/12/18 (from the past 48 hour(s))  Lactic acid, plasma     Status: Abnormal   Collection Time: 06/12/18  7:28 PM  Result Value Ref Range   Lactic Acid, Venous 2.3 (HH) 0.5 - 1.9 mmol/L    Comment: CRITICAL RESULT CALLED TO, READ BACK BY AND VERIFIED WITH: ELLIS,C. AT 2022 ON 06/12/2018 BY EVA Performed at [facility], [address], [place], [place] [address]   Lactate dehydrogenase     Status: Abnormal   Collection Time: 06/12/18 10:40 PM  Result Value Ref Range   LDH 224 (H) 98 - 192 U/L    Comment: Performed at [facility], [address], [place], [place] [address]  Reticulocytes     Status: Abnormal   Collection Time: 06/12/18 10:40 PM  Result Value Ref Range   Retic Ct Pct 2.4 0.4 - 3.1 %   RBC. 2.07 (L) 4.22 - 5.81 MIL/uL   Retic Count, Absolute 49.7 19.0 - 186.0 K/uL   Immature Retic Fract 19.7 (H) 2.3 - 15.9 %    Comment: Performed at [facility], [address] [place], [place] [address]  Direct antiglobulin test     Status: None   Collection Time: 06/12/18 10:40 PM  Result Value Ref Range   DAT, complement NEG    DAT, IgG      NEG Performed at [facility], [place] [address], [place], [place] [address]   Differential     Status: Abnormal   Collection Time: 06/12/18 10:40 PM  Result Value Ref Range   Neutrophils Relative % 52 %   Neutro Abs 5.4 1.7 - 7.7 K/uL   Band Neutrophils 3 %   Lymphocytes Relative 22 %   Lymphs Abs 2.2 0.7 - 4.0 K/uL   Monocytes Relative 13 %   Monocytes Absolute 1.3 (H) 0.1 - 1.0 K/uL   Eosinophils Relative 3 %   Eosinophils  Absolute 0.3 0.0 - 0.5 K/uL   Basophils Relative 1 %   Basophils Absolute 0.1 0.0 - 0.1 K/uL   Other 6 %   nRBC 3 (H) 0 /100 WBC   Abnormal Lymphocytes Present PRESENT     Comment: Performed at [facility], [address], [place], [place] [address]  Save Smear     Status: None   Collection Time: 06/12/18 10:40 PM  Result Value Ref Range   Smear Review SMEAR STAINED AND AVAILABLE FOR REVIEW     Comment: Performed at [facility], [address] [place], [place] [address]  PTH, intact and calcium     Status: Abnormal   Collection Time: 06/12/18 10:40 PM  Result Value Ref Range   PTH 12 (L) 15 - 65 pg/mL   Calcium, Total (PTH) 12.3 (H) 8.7 - 10.2 mg/dL   PTH Interp Comment     Comment: (NOTE) Interpretation                 Intact PTH    Calcium                                (pg/mL)      (mg/dL) Normal                          15 - 65     8.6 - 10.2 Primary Hyperparathyroidism         >65          >10.2 Secondary Hyperparathyroidism       >65          <10.2 Non-Parathyroid Hypercalcemia       <65          >10.2 Hypoparathyroidism                  <15          < 8.6 Non-Parathyroid Hypocalcemia    15 - 65          < 8.6 Performed At: [facility] [address] [place], [place] [id number] Rush Farmer MD VH:[id number]   PTH-related peptide     Status: None   Collection Time: 06/12/18 10:40 PM  Result Value Ref Range   PTH-related peptide NFPR pmol/L    Comment: (NOTE) No frozen plasma received.      Mariel Kansky was notified 06/14/2018. Performed  At: [place], [place] [id number] [place] Sheral Apley MD UX:[id number]   Protein electrophoresis, serum     Status: Abnormal   Collection Time: 06/12/18 10:40 PM  Result Value Ref Range   Total Protein ELP 6.5 6.0 - 8.5 g/dL   Albumin ELP 3.6 2.9 - 4.4 g/dL   Alpha-1-Globulin 0.1 0.0 - 0.4 g/dL   Alpha-2-Globulin 1.1 (H) 0.4 - 1.0 g/dL   Beta Globulin 0.8 0.7 - 1.3 g/dL   Gamma Globulin 0.8 0.4 - 1.8 g/dL   M-Spike, % 0.4 (H) Not Observed g/dL   SPE Interp. Comment     Comment: (NOTE) The SPE pattern demonstrates a single peak (M-spike) in the gamma region which may represent monoclonal protein. This peak may also be caused by circulating immune complexes, cryoglobulins, C-reactive protein, fibrinogen or hemolysis.  If clinically indicated, the presence of a monoclonal gammopathy may be confirmed by immuno- fixation, as well as an evaluation of the urine for the presence  of Bence-Jones protein. Performed At: [facility] [place], [place] [id number] Rush Farmer MD [id number]:[id number]    Comment Comment     Comment: (NOTE) Protein electrophoresis scan will follow via computer, mail, or courier delivery.    Globulin, Total 2.9 2.2 - 3.9 g/dL   A/G Ratio 1.2 0.7 - 1.7  AFP tumor marker     Status: None   Collection Time: 06/12/18 10:40 PM  Result Value Ref Range   AFP, Serum, Tumor Marker 4.0 0.0 - 8.3 ng/mL    Comment: (NOTE) Roche [employer]ostics Electrochemiluminescence Immunoassay (ECLIA) Values obtained with different assay methods or kits cannot be used interchangeably.  Results cannot be interpreted as absolute evidence of the presence or absence of malignant disease. This test is not interpretable in pregnant females. Performed At: [facility] [place], [place] [id number] Rush Farmer MD [id number]   Vitamin B12     Status: None   Collection Time: 06/12/18 10:40 PM  Result Value Ref  Range   Vitamin B-12 518 180 - 914 pg/mL    Comment: (NOTE) This assay is not validated for testing neonatal or myeloproliferative syndrome specimens for Vitamin B12 levels. Performed at [facility], [address], [place], [place] [address]   Iron and TIBC     Status: None   Collection Time: 06/12/18 10:40 PM  Result Value Ref Range   Iron 131 45 - 182 ug/dL   TIBC 373 250 - 450 ug/dL   Saturation Ratios 35 17.9 - 39.5 %   UIBC 242 ug/dL    Comment: Performed at [facility], [address], [place], [place] [address]  Ferritin     Status: Abnormal   Collection Time: 06/12/18 10:40 PM  Result Value Ref Range   Ferritin 612 (H) 24 - 336 ng/mL    Comment: Performed at [facility], [address], [place], [place] [address]  Basic metabolic panel     Status: Abnormal   Collection Time: 06/12/18 10:40 PM  Result Value Ref Range   Sodium 131 (L) 135 - 145 mmol/L   Potassium 4.4 3.5 - 5.1 mmol/L   Chloride 102 98 - 111 mmol/L   CO2 16 (L) 22 - 32 mmol/L   Glucose, Bld 99 70 - 99 mg/dL   BUN 85 (H) 6 - 20 mg/dL   Creatinine, Ser 5.69 (H) 0.61 - 1.24 mg/dL   Calcium 12.2 (H) 8.9 - 10.3 mg/dL   GFR calc non Af Amer 10 (L) >60 mL/min   GFR calc Af Amer 12 (L) >60 mL/min   Anion gap 13 5 - 15    Comment: Performed at [facility], [address] [place], [place] [address]  Pathologist smear review     Status: None   Collection Time: 06/12/18 10:40 PM  Result Value Ref Range   Path Review Reviewed By Violet Baldy, M.D.     Comment: 3.16.2020 PLASMACYTOID LYMPHOCYTES AND PLASMA CELLS PRESENT. HEMATOLOGIC EVALUATION IS RECOMMENDED.  Performed at [facility], [place] [address], [place], [place] [address]   Folate     Status: Abnormal   Collection Time: 06/12/18 10:41 PM  Result Value Ref Range   Folate 5.5 (L) >5.9 ng/mL    Comment: Performed at [facility], [address] [place], [place] [address]  Culture, blood (routine x 2) Call MD if unable to obtain prior  to antibiotics being given     Status: None (Preliminary result)   Collection Time:  06/12/18 10:41 PM  Result Value Ref Range   Specimen Description BLOOD RIGHT HAND    Special Requests      BOTTLES DRAWN AEROBIC AND ANAEROBIC Blood Culture adequate volume   Culture      NO GROWTH 2 DAYS Performed at [facility], [address] [place], [place] [address]    Report Status PENDING   Respiratory Panel by PCR     Status: Abnormal   Collection Time: 06/12/18 10:44 PM  Result Value Ref Range   Adenovirus NOT DETECTED NOT DETECTED   Coronavirus 229E NOT DETECTED NOT DETECTED    Comment: (NOTE) The Coronavirus on the Respiratory Panel, DOES NOT test for the novel  Coronavirus (2019 nCoV)    Coronavirus HKU1 NOT DETECTED NOT DETECTED   Coronavirus NL63 NOT DETECTED NOT DETECTED   Coronavirus OC43 NOT DETECTED NOT DETECTED   Metapneumovirus NOT DETECTED NOT DETECTED   Rhinovirus / Enterovirus DETECTED (A) NOT DETECTED   Influenza A NOT DETECTED NOT DETECTED   Influenza B NOT DETECTED NOT DETECTED   Parainfluenza Virus 1 NOT DETECTED NOT DETECTED   Parainfluenza Virus 2 NOT DETECTED NOT DETECTED   Parainfluenza Virus 3 NOT DETECTED NOT DETECTED   Parainfluenza Virus 4 NOT DETECTED NOT DETECTED   Respiratory Syncytial Virus NOT DETECTED NOT DETECTED   Bordetella pertussis NOT DETECTED NOT DETECTED   Chlamydophila pneumoniae NOT DETECTED NOT DETECTED   Mycoplasma pneumoniae NOT DETECTED NOT DETECTED    Comment: Performed at [facility], [place] [address] [place], [place] [address]  Culture, blood (routine x 2) Call MD if unable to obtain prior to antibiotics being given     Status: None (Preliminary result)   Collection Time: 06/12/18 10:58 PM  Result Value Ref Range   Specimen Description BLOOD LEFT ARM    Special Requests      BOTTLES DRAWN AEROBIC AND ANAEROBIC Blood Culture adequate volume   Culture      NO GROWTH 2 DAYS Performed at [facility], [address],  [place], [place] [address]    Report Status PENDING   MRSA PCR Screening     Status: None   Collection Time: 06/13/18 12:20 AM  Result Value Ref Range   MRSA by PCR NEGATIVE NEGATIVE    Comment:        The GeneXpert MRSA Assay (FDA approved for NASAL specimens only), is one component of a comprehensive MRSA colonization surveillance program. It is not intended to diagnose MRSA infection nor to guide or monitor treatment for MRSA infections. Performed at [facility], [address] [place], [place] [address]   Basic metabolic panel     Status: Abnormal   Collection Time: 06/13/18  6:39 AM  Result Value Ref Range   Sodium 134 (L) 135 - 145 mmol/L   Potassium 4.5 3.5 - 5.1 mmol/L   Chloride 105 98 - 111 mmol/L   CO2 16 (L) 22 - 32 mmol/L   Glucose, Bld 83 70 - 99 mg/dL   BUN 85 (H) 6 - 20 mg/dL   Creatinine, Ser 5.48 (H) 0.61 - 1.24 mg/dL   Calcium 12.3 (H) 8.9 - 10.3 mg/dL   GFR calc non Af Amer 10 (L) >60 mL/min   GFR calc Af Amer 12 (L) >60 mL/min   Anion gap 13 5 - 15    Comment: Performed at [facility], [address] [place],  [address]  CBC WITH DIFFERENTIAL     Status: Abnormal   Collection  Time: 06/13/18  6:39 AM  Result Value Ref Range   WBC 9.6 4.0 - 10.5 K/uL   RBC 2.06 (L) 4.22 - 5.81 MIL/uL   Hemoglobin 7.3 (L) 13.0 - 17.0 g/dL   HCT 22.9 (L) 39.0 - 52.0 %   MCV 111.2 (H) 80.0 - 100.0 fL   MCH 35.4 (H) 26.0 - 34.0 pg   MCHC 31.9 30.0 - 36.0 g/dL   RDW 15.6 (H) 11.5 - 15.5 %   Platelets 150 150 - 400 K/uL   nRBC 3.2 (H) 0.0 - 0.2 %   Neutrophils Relative % 38 %   Neutro Abs 3.6 1.7 - 7.7 K/uL   Lymphocytes Relative 39 %   Lymphs Abs 3.8 0.7 - 4.0 K/uL   Monocytes Relative 20 %   Monocytes Absolute 1.9 (H) 0.1 - 1.0 K/uL   Eosinophils Relative 1 %   Eosinophils Absolute 0.1 0.0 - 0.5 K/uL   Basophils Relative 0 %   Basophils Absolute 0.0 0.0 - 0.1 K/uL   RBC Morphology NUCLEATED RED CELLS PRESENT    Immature Granulocytes 2 %   Abs Immature  Granulocytes 0.19 (H) 0.00 - 0.07 K/uL   Abnormal Lymphocytes Present PRESENT    Polychromasia PRESENT     Comment: Performed at [facility], [address], [place], [place] [address]  CBC with Differential/Platelet     Status: Abnormal   Collection Time: 06/14/18  5:48 AM  Result Value Ref Range   WBC 10.7 (H) 4.0 - 10.5 K/uL   RBC 2.02 (L) 4.22 - 5.81 MIL/uL   Hemoglobin 7.0 (L) 13.0 - 17.0 g/dL   HCT 22.5 (L) 39.0 - 52.0 %   MCV 111.4 (H) 80.0 - 100.0 fL   MCH 34.7 (H) 26.0 - 34.0 pg   MCHC 31.1 30.0 - 36.0 g/dL   RDW 15.5 11.5 - 15.5 %   Platelets 165 150 - 400 K/uL   nRBC 2.4 (H) 0.0 - 0.2 %   Neutrophils Relative % 39 %   Neutro Abs 4.2 1.7 - 7.7 K/uL   Lymphocytes Relative 39 %   Lymphs Abs 4.1 (H) 0.7 - 4.0 K/uL   Monocytes Relative 18 %   Monocytes Absolute 2.0 (H) 0.1 - 1.0 K/uL   Eosinophils Relative 1 %   Eosinophils Absolute 0.1 0.0 - 0.5 K/uL   Basophils Relative 1 %   Basophils Absolute 0.1 0.0 - 0.1 K/uL   Immature Granulocytes 2 %   Abs Immature Granulocytes 0.25 (H) 0.00 - 0.07 K/uL    Comment: Performed at [facility], [address] [place], [place] [address]  Basic metabolic panel     Status: Abnormal   Collection Time: 06/14/18  5:48 AM  Result Value Ref Range   Sodium 135 135 - 145 mmol/L   Potassium 4.9 3.5 - 5.1 mmol/L   Chloride 108 98 - 111 mmol/L   CO2 16 (L) 22 - 32 mmol/L   Glucose, Bld 82 70 - 99 mg/dL   BUN 71 (H) 6 - 20 mg/dL   Creatinine, Ser 5.14 (H) 0.61 - 1.24 mg/dL   Calcium 12.2 (H) 8.9 - 10.3 mg/dL   GFR calc non Af Amer 11 (L) >60 mL/min   GFR calc Af Amer 13 (L) >60 mL/min   Anion gap 11 5 - 15    Comment: Performed at [facility], [address] [place], [place] [address]  Protein / creatinine ratio, urine     Status: Abnormal   Collection  Time: 06/14/18  9:25 AM  Result Value Ref Range   Creatinine, Urine 58.21 mg/dL   Total Protein, Urine 308 mg/dL    Comment: RESULTS CONFIRMED BY MANUAL DILUTION   Protein Creatinine  Ratio 5.29 (H) 0.00 - 0.15 mg/mg[Cre]    Comment: Performed at [facility], [address], [place], [place] [address]  Urine rapid drug screen (hosp performed)     Status: None   Collection Time: 06/14/18  9:25 AM  Result Value Ref Range   Opiates NONE DETECTED NONE DETECTED   Cocaine NONE DETECTED NONE DETECTED   Benzodiazepines NONE DETECTED NONE DETECTED   Amphetamines NONE DETECTED NONE DETECTED   Tetrahydrocannabinol NONE DETECTED NONE DETECTED   Barbiturates NONE DETECTED NONE DETECTED    Comment: (NOTE) DRUG SCREEN FOR MEDICAL PURPOSES ONLY.  IF CONFIRMATION IS NEEDED FOR ANY PURPOSE, NOTIFY LAB WITHIN 5 DAYS. LOWEST DETECTABLE LIMITS FOR URINE DRUG SCREEN Drug Class                     Cutoff (ng/mL) Amphetamine and metabolites    1000 Barbiturate and metabolites    200 Benzodiazepine                 222 Tricyclics and metabolites     300 Opiates and metabolites        300 Cocaine and metabolites        300 THC                            50 Performed at [facility], [address], [place], [place] [address]   Procalcitonin - Baseline     Status: None   Collection Time: 06/14/18  9:49 AM  Result Value Ref Range   Procalcitonin 7.71 ng/mL    Comment:        Interpretation: PCT > 2 ng/mL: Systemic infection (sepsis) is likely, unless other causes are known. (NOTE)       Sepsis PCT Algorithm           Lower Respiratory Tract                                      Infection PCT Algorithm    ----------------------------     ----------------------------         PCT < 0.25 ng/mL                PCT < 0.10 ng/mL         Strongly encourage             Strongly discourage   discontinuation of antibiotics    initiation of antibiotics    ----------------------------     -----------------------------       PCT 0.25 - 0.50 ng/mL            PCT 0.10 - 0.25 ng/mL               OR       >80% decrease in PCT            Discourage initiation of                                             antibiotics  Encourage discontinuation           of antibiotics    ----------------------------     -----------------------------         PCT >= 0.50 ng/mL              PCT 0.26 - 0.50 ng/mL               AND       <80% decrease in PCT              Encourage initiation of                                             antibiotics       Encourage continuation           of antibiotics    ----------------------------     -----------------------------        PCT >= 0.50 ng/mL                  PCT > 0.50 ng/mL               AND         increase in PCT                  Strongly encourage                                      initiation of antibiotics    Strongly encourage escalation           of antibiotics                                     -----------------------------                                           PCT <= 0.25 ng/mL                                                 OR                                        > 80% decrease in PCT                                     Discontinue / Do not initiate                                             antibiotics Performed at [facility], [address], [place], [place] [address]   Prepare RBC     Status: None   Collection Time: 06/14/18  9:49 AM  Result Value Ref Range  Order Confirmation      ORDER PROCESSED BY BLOOD BANK Performed at [facility], [address] [place], [place] [address]   CK     Status: None   Collection Time: 06/14/18  9:49 AM  Result Value Ref Range   Total CK 50 49 - 397 U/L    Comment: Performed at [facility], [address], [place], [place] [address]  Type and screen [facility]     Status: None (Preliminary result)   Collection Time: 06/14/18  9:49 AM  Result Value Ref Range   ABO/RH(D) A POS    Antibody Screen NEG    Sample Expiration 06/17/2018    Unit Number [id number]    Blood Component Type RED CELLS,LR    Unit division 00    Status of Unit ALLOCATED    Transfusion Status OK  TO TRANSFUSE    Crossmatch Result Compatible    Unit Number [id number]    Blood Component Type RED CELLS,LR    Unit division 00    Status of Unit ISSUED    Transfusion Status OK TO TRANSFUSE    Crossmatch Result      Compatible Performed at [facility], [address] [place], [place] [address]   ABO/Rh     Status: None   Collection Time: 06/14/18  9:49 AM  Result Value Ref Range   ABO/RH(D)      A POS Performed at [facility], [address] [place],  [address]   Strep pneumoniae urinary antigen     Status: None   Collection Time: 06/14/18 10:00 AM  Result Value Ref Range   Strep Pneumo Urinary Antigen NEGATIVE NEGATIVE    Comment:        Infection due to S. pneumoniae cannot be absolutely ruled out since the antigen present may be below the detection limit of the test. Performed at [facility], [address] [place], [place] [address]   Iron and TIBC     Status: None   Collection Time: 06/14/18 10:11 AM  Result Value Ref Range   Iron 69 45 - 182 ug/dL   TIBC 343 250 - 450 ug/dL   Saturation Ratios 20 17.9 - 39.5 %   UIBC 274 ug/dL    Comment: Performed at [facility], [address], [place],  [address]      RADIOGRAPHY: Ct Abdomen Pelvis Wo Contrast  Result Date: 06/12/2018 CLINICAL DATA:  Abdominal pain and fever EXAM: CT ABDOMEN AND PELVIS WITHOUT CONTRAST TECHNIQUE: Multidetector CT imaging of the abdomen and pelvis was performed following the standard protocol without IV contrast. Oral contrast was administered. COMPARISON:  Aug 12, 2012 FINDINGS: Lower chest: There are free-flowing pleural effusions bilaterally with bibasilar atelectasis. There is questionable mild consolidation in the posterior left lung base as well. Hepatobiliary: No focal liver lesions are appreciable on this noncontrast enhanced study. The gallbladder wall is not appreciably thickened. There is no biliary duct dilatation. Pancreas: No pancreatic mass or inflammatory  focus. Spleen: No splenic lesions are evident. Adrenals/Urinary Tract: Adrenals bilaterally appear unremarkable. Kidneys bilaterally show no evident mass or hydronephrosis on either side. There is no appreciable renal or ureteral calculus on either side. Urinary bladder is midline with wall thickness within normal limits. Stomach/Bowel: There are sigmoid diverticula without overt diverticulitis. There is wall thickening in portions of the mid sigmoid colon without surrounding inflammation. Suspect muscular hypertrophy in this area from chronic diverticulosis. No bowel wall thickening is noted elsewhere. No  bowel obstruction appreciable. No free air or portal venous air. Vascular/Lymphatic: There is aortic and bilateral iliac artery atherosclerosis. No aneurysm evident. There is no appreciable adenopathy in the abdomen or pelvis. Reproductive: Prostate and seminal vesicles appear normal in size and contour. There is no appreciable pelvic mass. Other: Appendix appears normal. No abscess or ascites is evident in the abdomen or pelvis. Musculoskeletal: There is extensive postoperative change from L1-L4. There is arthropathy throughout the lumbar region. There are no blastic or lytic bone lesions. There is no intramuscular or abdominal wall lesion evident. IMPRESSION: 1. Moderate free-flowing pleural effusions bilaterally with bibasilar atelectasis. Question a degree of superimposed pneumonia in the posterior left base. 2. Wall thickening in the mid sigmoid colon, likely due to muscular hypertrophy from chronic diverticulosis. Earliest changes of diverticulitis in this area are difficult to entirely exclude. No overt diverticulitis is evident. 3. No bowel obstruction. Appendix appears normal. No evident abscess in the abdomen or pelvis. 4. No evident renal or ureteral calculus. No evident hydronephrosis on either side. 5.  Aortoiliac atherosclerosis. 6.  Extensive postoperative change throughout the lumbar spine.  Electronically Signed   By: Lowella Grip III M.D.   On: 06/12/2018 17:14   Dg Chest 2 View  Result Date: 06/12/2018 CLINICAL DATA:  Stomach pain for a week. No vomiting or diarrhea. Fever. EXAM: CHEST - 2 VIEW COMPARISON:  CT chest 05/28/2016. Chest 11/04/2010 FINDINGS: Normal heart size and pulmonary vascularity. Small left pleural effusion with infiltration in the left lung base. This suggests pneumonia. Right lung is clear. No pneumothorax. Mediastinal contours appear intact. IMPRESSION: Small left pleural effusion with infiltration in the left lung base suggesting pneumonia. Electronically Signed   By: Lucienne Capers M.D.   On: 06/12/2018 21:18   Ct Chest Wo Contrast  Result Date: 06/14/2018 CLINICAL DATA:  58 year old male with history of chest pain and pleural effusion. EXAM: CT CHEST WITHOUT CONTRAST TECHNIQUE: Multidetector CT imaging of the chest was performed following the standard protocol without IV contrast. COMPARISON:  Chest CT 05/20/2016.  Chest x-ray 06/12/2018. FINDINGS: Cardiovascular: Heart size is normal. There is no significant pericardial fluid, thickening or pericardial calcification. Aortic atherosclerosis. No coronary artery calcifications. Mediastinum/Nodes: No pathologically enlarged mediastinal or hilar lymph nodes. Esophagus is unremarkable in appearance. No axillary lymphadenopathy. Lungs/Pleura: Multiple tiny 2-3 mm pulmonary nodules scattered throughout both lungs, several of which are calcified, compatible with benign granulomas. No larger more suspicious appearing pulmonary nodules or masses are noted. Moderate-sized bilateral pleural effusions lying dependently. This is associated with some mild passive atelectasis in the lower lobes of the lungs bilaterally. No acute consolidative airspace disease. Diffuse bronchial wall thickening with mild centrilobular and paraseptal emphysema. Upper Abdomen: Aortic atherosclerosis. Musculoskeletal: There are no aggressive  appearing lytic or blastic lesions noted in the visualized portions of the skeleton. IMPRESSION: 1. Moderate bilateral pleural effusions lying dependently with areas of passive subsegmental atelectasis in the lower lobes of the lungs bilaterally. 2. Multiple tiny 2-3 mm pulmonary nodules scattered throughout the lungs bilaterally, nonspecific, but similar to prior study from 05/28/2016, considered definitively benign. 3. Aortic atherosclerosis. 4. Diffuse bronchial wall thickening with mild centrilobular and paraseptal emphysema; imaging findings suggestive of underlying COPD. Aortic Atherosclerosis (ICD10-I70.0) and Emphysema (ICD10-J43.9). Electronically Signed   By: Vinnie Langton M.D.   On: 06/14/2018 10:53   US Renal  Result Date: 06/14/2018 CLINICAL DATA:  Acute on chronic renal failure EXAM: RENAL / URINARY TRACT ULTRASOUND COMPLETE COMPARISON:  06/12/2018 FINDINGS: Right Kidney: Renal measurements:  11.6 x 4.9 x 7.6 cm = volume: 224 mL. Diffuse increased echogenicity is noted. No mass or hydronephrosis is noted. Left Kidney: Renal measurements: 11.9 x 6.5 x 5.5 cm = volume: 222 mL. Increased echogenicity is noted. No mass lesion or hydronephrosis is seen. Bladder: Appears normal for degree of bladder distention. Bilateral pleural effusions are noted similar to that seen on prior CT. IMPRESSION: Diffuse increased echogenicity consistent with the given clinical history. Bilateral small effusions. Electronically Signed   By: Inez Catalina M.D.   On: 06/14/2018 10:30       ASSESSMENT and PLAN:  1.  Hypercalcemia: - Calcium has come down to 12.2 from 13.1 on admission. -May consider calcitonin 4 units/kg.  May also consider denosumab 120 mg subcu. - PTH related peptide could not be performed because of the wrong tube sent. - SPEP shows 0.4 g of monoclonal protein.  Serum immunofixation is pending.  Please also send serum free light chain assay. - Would also recommend doing a metastatic bone survey  to rule out lytic lesions. - MRI of the lumbar spine on 05/06/2018 shows diffusely abnormal bone marrow signal suggestive of myeloproliferative disorder/infiltrative metastatic disease.  Abnormal right prevertebral and epidural soft tissue at L2-3 seen. -Patient is scheduled for MRI of the thoracic and lumbar spine today.  We will follow-up on the results. - CT scan of the abdomen and pelvis on 06/12/2018 reviewed by me did not show any metastatic disease.  There is wall thickening in the mid sigmoid colon.  CT chest without contrast on 06/14/2018 shows moderate bilateral pleural effusions, multiple tiny 2-3 mm pulmonary nodules which are stable since February 2018.  2.  Macrocytic anemia: - His folic acid was slightly low.  Consider folic acid supplementation.  B12 was within normal limits.  Ferritin and iron panel was also acceptable. -He is also receiving blood transfusion.  All questions were answered. The patient knows to call the clinic with any problems, questions or concerns. We can certainly see the patient much sooner if necessary.    [name]

## 2018-06-14 NOTE — Progress Notes (Signed)
Initial Nutrition Assessment  DOCUMENTATION CODES:      INTERVENTION:  Ensure Enlive po BID, each supplement provides 350 kcal and 20 grams of protein (Strawberry ).   Heart Healthy diet    NUTRITION DIAGNOSIS:   Inadequate oral intake related to poor appetite(c/o taste changes- likely multi-factored: chronic pain, kidney disease ) as evidenced by per patient/family report, percent weight loss (17% in 2 months).  GOAL:   Patient will meet greater than or equal to 90% of their needs  MONITOR:   Supplement acceptance, PO intake  REASON FOR ASSESSMENT:   Malnutrition Screening Tool    ASSESSMENT: Patient is a 58 yo male with history of CKD-, Urinary Stones, testicle cancer, emphysema, chronic back pain and substance abuse. He presents with Acute kidney injury, hypercalcemia, macrocytic anemia (+ 2 units PRBC).   Meal intake 75%. Patient is lying in bed on his side with his eyes closed complaining of pain (9-10). His daughters mother is here at bedside. Patient says he has not been eating well the past 3 months following hurting his back in December and quitting smoking.   Complains of taste changes which he associates with no cigarettes. His lunch is here and approximately 50% consumed today and 75% of lunch yesterday. He says I am eating as much as I can. Encouraged him to consider adding oral supplements while his desire for food is impaired. He is amiable to drink Clinical research associate.  His weight has significantly decreased (17% x 2 months) from 204 lb down to current 169 lb. His weight history indicates a usual body weight of 92-94 kg up until mid-January. He is taking a diuretic unclear if this is new for him.    Medications reviewed and include: Ensure Enlive, Lasix, Zithromax, Rocephin  Labs: BMP Latest Ref Rng & Units 06/14/2018 06/13/2018 06/12/2018  Glucose 70 - 99 mg/dL 82 83 99  BUN 6 - 20 mg/dL 71(H) 85(H) 85(H)  Creatinine 0.61 - 1.24 mg/dL 5.14(H) 5.48(H)  5.69(H)  BUN/Creat Ratio 6 - 22 (calc) - - -  Sodium 135 - 145 mmol/L 135 134(L) 131(L)  Potassium 3.5 - 5.1 mmol/L 4.9 4.5 4.4  Chloride 98 - 111 mmol/L 108 105 102  CO2 22 - 32 mmol/L 16(L) 16(L) 16(L)  Calcium 8.9 - 10.3 mg/dL 12.2(H) 12.3(H) 12.2(H)     NUTRITION - FOCUSED PHYSICAL EXAM: unable to perform    Diet Order:   Diet Order            Diet Heart Room service appropriate? Yes; Fluid consistency: Thin  Diet effective now              EDUCATION NEEDS:   Not appropriate for education at this time  Skin:  Skin Assessment: Reviewed RN Assessment  Last BM:  3/15  Height:   Ht Readings from Last 1 Encounters:  06/12/18 6' (1.829 m)    Weight:   Wt Readings from Last 1 Encounters:  06/12/18 77.1 kg    Ideal Body Weight:  81 kg  BMI:  Body mass index is 23.06 kg/m.  Estimated Nutritional Needs:   Kcal:  0539-7673 (28-32 kcal/kg/bw)  Protein:  54-62 gr (0.7-0.8 gr/kg/bw)  Fluid:  2.1-2.3 liters daily    Colman Cater MS,RD,CSG,LDN Office: [phone] Pager: [phone]

## 2018-06-15 ENCOUNTER — Other Ambulatory Visit: Payer: Self-pay | Admitting: Radiology

## 2018-06-15 ENCOUNTER — Inpatient Hospital Stay (HOSPITAL_COMMUNITY): Payer: Medicare Other

## 2018-06-15 DIAGNOSIS — Z720 Tobacco use: Secondary | ICD-10-CM

## 2018-06-15 DIAGNOSIS — M545 Low back pain: Secondary | ICD-10-CM

## 2018-06-15 LAB — RENAL FUNCTION PANEL
Albumin: 3.8 g/dL (ref 3.5–5.0)
Anion gap: 15 (ref 5–15)
BUN: 63 mg/dL — ABNORMAL HIGH (ref 6–20)
CO2: 18 mmol/L — ABNORMAL LOW (ref 22–32)
Calcium: 12.9 mg/dL — ABNORMAL HIGH (ref 8.9–10.3)
Chloride: 101 mmol/L (ref 98–111)
Creatinine, Ser: 4.92 mg/dL — ABNORMAL HIGH (ref 0.61–1.24)
GFR calc Af Amer: 14 mL/min — ABNORMAL LOW (ref >60)
GFR calc non Af Amer: 12 mL/min — ABNORMAL LOW (ref >60)
Glucose, Bld: 80 mg/dL (ref 70–99)
Phosphorus: 6 mg/dL — ABNORMAL HIGH (ref 2.5–4.6)
Potassium: 4.6 mmol/L (ref 3.5–5.1)
Sodium: 134 mmol/L — ABNORMAL LOW (ref 135–145)

## 2018-06-15 LAB — PROTEIN ELECTROPHORESIS, SERUM
A/G Ratio: 1.4 (ref 0.7–1.7)
Albumin ELP: 3.9 g/dL (ref 2.9–4.4)
Alpha-1-Globulin: 0.2 g/dL (ref 0.0–0.4)
Alpha-2-Globulin: 1.1 g/dL — ABNORMAL HIGH (ref 0.4–1.0)
Beta Globulin: 0.8 g/dL (ref 0.7–1.3)
Gamma Globulin: 0.7 g/dL (ref 0.4–1.8)
Globulin, Total: 2.8 g/dL (ref 2.2–3.9)
M-Spike, %: 0.4 g/dL — ABNORMAL HIGH
Total Protein ELP: 6.7 g/dL (ref 6.0–8.5)

## 2018-06-15 LAB — TYPE AND SCREEN
ABO/RH(D): A POS
Antibody Screen: NEGATIVE
Unit division: 0
Unit division: 0

## 2018-06-15 LAB — HEPATIC FUNCTION PANEL
ALT: 19 U/L (ref 0–44)
AST: 22 U/L (ref 15–41)
Albumin: 3.8 g/dL (ref 3.5–5.0)
Alkaline Phosphatase: 82 U/L (ref 38–126)
Bilirubin, Direct: <0.1 mg/dL (ref 0.0–0.2)
Total Bilirubin: 0.3 mg/dL (ref 0.3–1.2)
Total Protein: 7.1 g/dL (ref 6.5–8.1)

## 2018-06-15 LAB — IMMUNOFIXATION, URINE

## 2018-06-15 LAB — CBC WITH DIFFERENTIAL/PLATELET
Abs Immature Granulocytes: 0.31 10*3/uL — ABNORMAL HIGH (ref 0.00–0.07)
Basophils Absolute: 0.1 10*3/uL (ref 0.0–0.1)
Basophils Relative: 1 %
Eosinophils Absolute: 0.1 10*3/uL (ref 0.0–0.5)
Eosinophils Relative: 1 %
HCT: 29.7 % — ABNORMAL LOW (ref 39.0–52.0)
Hemoglobin: 9.8 g/dL — ABNORMAL LOW (ref 13.0–17.0)
Immature Granulocytes: 3 %
Lymphocytes Relative: 40 %
Lymphs Abs: 4.9 10*3/uL — ABNORMAL HIGH (ref 0.7–4.0)
MCH: 33.1 pg (ref 26.0–34.0)
MCHC: 33 g/dL (ref 30.0–36.0)
MCV: 100.3 fL — ABNORMAL HIGH (ref 80.0–100.0)
Monocytes Absolute: 2.7 10*3/uL — ABNORMAL HIGH (ref 0.1–1.0)
Monocytes Relative: 22 %
Neutro Abs: 3.9 10*3/uL (ref 1.7–7.7)
Neutrophils Relative %: 33 %
Platelets: 150 10*3/uL (ref 150–400)
RBC: 2.96 MIL/uL — ABNORMAL LOW (ref 4.22–5.81)
RDW: 20.1 % — ABNORMAL HIGH (ref 11.5–15.5)
WBC: 12 10*3/uL — ABNORMAL HIGH (ref 4.0–10.5)
nRBC: 2.5 % — ABNORMAL HIGH (ref 0.0–0.2)

## 2018-06-15 LAB — PROTIME-INR
INR: 1 (ref 0.8–1.2)
Prothrombin Time: 12.7 seconds (ref 11.4–15.2)

## 2018-06-15 LAB — BPAM RBC
Blood Product Expiration Date: 202004092359
Blood Product Expiration Date: 202004092359
ISSUE DATE / TIME: 202003161239
ISSUE DATE / TIME: 202003161809
Unit Type and Rh: 6200
Unit Type and Rh: 6200

## 2018-06-15 LAB — LEGIONELLA PNEUMOPHILA SEROGP 1 UR AG: L. pneumophila Serogp 1 Ur Ag: NEGATIVE

## 2018-06-15 LAB — PARATHYROID HORMONE, INTACT (NO CA): PTH: 9 pg/mL — ABNORMAL LOW (ref 15–65)

## 2018-06-15 LAB — HIV ANTIBODY (ROUTINE TESTING W REFLEX): HIV Screen 4th Generation wRfx: NONREACTIVE

## 2018-06-15 MED ORDER — CALCITONIN (SALMON) 200 UNIT/ML IJ SOLN
4.0000 [IU]/kg | Freq: Two times a day (BID) | INTRAMUSCULAR | Status: AC
  Administered 2018-06-15 – 2018-06-16 (×4): 308 [IU] via SUBCUTANEOUS
  Filled 2018-06-15 (×4): qty 1.54

## 2018-06-15 MED ORDER — FOLIC ACID 1 MG PO TABS
1.0000 mg | ORAL_TABLET | Freq: Every day | ORAL | Status: DC
  Administered 2018-06-15 – 2018-06-22 (×8): 1 mg via ORAL
  Filled 2018-06-15 (×9): qty 1

## 2018-06-15 MED ORDER — HYDROCODONE-ACETAMINOPHEN 5-325 MG PO TABS
1.0000 | ORAL_TABLET | ORAL | Status: DC | PRN
  Administered 2018-06-15 – 2018-06-16 (×5): 1 via ORAL
  Filled 2018-06-15 (×5): qty 1

## 2018-06-15 NOTE — Clinical Social Work Note (Signed)
Patient/daughter are interested in patient establishing care with Dr. Karie Kirks. Call was made to Dr. Vickey Sages office and Arbie Cookey indicated that they were not currently taking new patient's due "too much craziness" referring to the COVID-19 outbreak. She stated that they may start back taking patients around July.   LCSW spoke with Rodena Piety (daughter's mother) and requested that they review the PCP list for additional providers patient may be interested in.      Marcelyn Ruppe, Clydene Pugh, LCSW

## 2018-06-15 NOTE — Progress Notes (Signed)
Patient ID: [name], male   DOB: 03-13-1961, 58 y.o.   MRN: [id number]   Pt is scheduled for biopsy tomorrow at [facility]  RN is aware to have pt to be at [facility] by 8 am via ambulance He will return to [facility] after procedure  Orders in place Check stat INR

## 2018-06-15 NOTE — Progress Notes (Signed)
PHARMACIST - PHYSICIAN COMMUNICATION  DR:   Tat  CONCERNING: IV- to- Oral Route Change Policy  RECOMMENDATION: This patient is receiving folic acid by the intravenous route.  Based on criteria approved by the Pharmacy and Therapeutics Committee, the intravenous medication(s) is/are being converted to the equivalent oral dose form(s).   DESCRIPTION: These criteria include:  The patient is eating (either orally or via tube) and/or has been taking other orally administered medications for a least 24 hours  The patient has no evidence of active gastrointestinal bleeding or impaired GI absorption (gastrectomy, short bowel, patient on TNA or NPO).  If you have questions about this conversion, please contact the Pharmacy Department  [x]   [phone] )  [facility] []   [phone] )  [facility] []   [phone] )  [facility] []   [phone] )  [facility] []   [phone] )  [place], [facility] 06/15/2018 10:27 AM

## 2018-06-15 NOTE — Care Management Important Message (Signed)
Important Message  Patient Details  Name: [name] MRN: [id number] Date of Birth: Mar 01, 1961   Medicare Important Message Given:  Yes    [place], LCSW 06/15/2018, 2:16 PM

## 2018-06-15 NOTE — Progress Notes (Signed)
[facility] ROUNDING NOTE   Subjective:   This is a 58 year old gentleman who presented with hypercalcemia and acute kidney injury.  His serum creatinine on presentation was 6.14 with a calcium of 13.1 he was given IV normal saline and Lasix for a saline diuresis.  The underlying condition of his acute kidney injury appears to be a paraproteinemia.  I appreciate oncology's assistance.  There is a 0.4 g monoclonal protein with a pending immunofixation.  Metastatic bone survey is being recommended by Dr. Delton Coombes.  Blood pressure 130/74 pulse of 101 temperature 98.6 O2 sats 9 9% room air  Urine output 1850 cc 06/14/2018 1800 cc 06/15/2018  Calcitonin 4 units/kg subcu twice daily Lasix 40 mg every 6 hours sodium bicarbonate 1.3 g 3 times daily sodium chloride infusion 416 cc/h, folic acid 1 mg daily  Sodium 134 potassium 4.6 chloride 108 CO2 18 BUN 63 creatinine 4.92 calcium 12.9 alkaline phosphatase 82 albumin 3.8 AST 22 ALT 19 iron saturations 20% WBC 12 hemoglobin 9.8 platelets 150.  PTH 9 protein creatinine ratio 5.29 mg/mg   X-ray of bone survey shows abnormal lucent changes identified in the lumbar spine.  Sclerotic focus in the inferior aspect of the left iliac joint.  Bilateral small pleural effusions.  MRI lumbar spine limited with no osseous injury of the lumbar spine.  Chest CT revealed bilateral pleural effusions multiple 2 to 3 mm pulmonary nodules aortic atherosclerosis and diffuse bronchial wall thickening.   Objective:  Vital signs in last 24 hours:  Temp:  [97.3 F (36.3 C)-98.8 F (37.1 C)] 98.6 F (37 C) (03/17 0549) Pulse Rate:  [101-116] 101 (03/17 0549) Resp:  [18-19] 18 (03/17 0549) BP: (109-134)/(50-74) 130/74 (03/17 0549) SpO2:  [98 %-100 %] 99 % (03/17 0549)  Weight change:  Filed Weights   06/12/18 1259 06/12/18 2221  Weight: 79 kg 77.1 kg    Intake/Output: I/O last 3 completed shifts: In: 6063 [I.V.:3182; Blood:310] Out: 3050  [Urine:3050]   Intake/Output this shift:  Total I/O In: 240 [P.O.:240] Out: 600 [Urine:600]  CVS- RRR no murmurs rubs or gallops RS- CTA no wheezes or rales ABD- BS present soft non-distended EXT- no edema   Basic Metabolic Panel: Recent Labs  Lab 06/12/18 1325 06/12/18 2240 06/13/18 0639 06/14/18 0548 06/15/18 0534  NA 132* 131* 134* 135 134*  K 4.3 4.4 4.5 4.9 4.6  CL 98 102 105 108 101  CO2 16* 16* 16* 16* 18*  GLUCOSE 101* 99 83 82 80  BUN 95* 85* 85* 71* 63*  CREATININE 6.14* 5.69* 5.48* 5.14* 4.92*  CALCIUM 13.1* 12.2*  12.3* 12.3* 12.2* 12.9*  PHOS  --   --   --   --  6.0*    Liver Function Tests: Recent Labs  Lab 06/12/18 1325 06/15/18 0534  AST 20 22  ALT 23 19  ALKPHOS 99 82  BILITOT 0.6 0.3  PROT 7.8 7.1  ALBUMIN 4.4 3.8  3.8   Recent Labs  Lab 06/12/18 1325  LIPASE 33   No results for input(s): AMMONIA in the last 168 hours.  CBC: Recent Labs  Lab 06/12/18 1325 06/12/18 2240 06/13/18 0639 06/14/18 0548 06/15/18 0534  WBC 10.6*  --  9.6 10.7* 12.0*  NEUTROABS  --  5.4 3.6 4.2 3.9  HGB 8.3*  --  7.3* 7.0* 9.8*  HCT 25.6*  --  22.9* 22.5* 29.7*  MCV 107.6*  --  111.2* 111.4* 100.3*  PLT 164  --  150 165 150  Cardiac Enzymes: Recent Labs  Lab 06/14/18 0949  CKTOTAL 50    BNP: Invalid input(s): POCBNP  CBG: No results for input(s): GLUCAP in the last 168 hours.  Microbiology: Results for orders placed or performed during the hospital encounter of 06/12/18  Culture, blood (routine x 2) Call MD if unable to obtain prior to antibiotics being given     Status: None (Preliminary result)   Collection Time: 06/12/18 10:41 PM  Result Value Ref Range Status   Specimen Description BLOOD RIGHT HAND  Final   Special Requests   Final    BOTTLES DRAWN AEROBIC AND ANAEROBIC Blood Culture adequate volume   Culture   Final    NO GROWTH 3 DAYS Performed at [facility], [address] [place], [place] [address]    Report Status  PENDING  Incomplete  Respiratory Panel by PCR     Status: Abnormal   Collection Time: 06/12/18 10:44 PM  Result Value Ref Range Status   Adenovirus NOT DETECTED NOT DETECTED Final   Coronavirus 229E NOT DETECTED NOT DETECTED Final    Comment: (NOTE) The Coronavirus on the Respiratory Panel, DOES NOT test for the novel  Coronavirus (2019 nCoV)    Coronavirus HKU1 NOT DETECTED NOT DETECTED Final   Coronavirus NL63 NOT DETECTED NOT DETECTED Final   Coronavirus OC43 NOT DETECTED NOT DETECTED Final   Metapneumovirus NOT DETECTED NOT DETECTED Final   Rhinovirus / Enterovirus DETECTED (A) NOT DETECTED Final   Influenza A NOT DETECTED NOT DETECTED Final   Influenza B NOT DETECTED NOT DETECTED Final   Parainfluenza Virus 1 NOT DETECTED NOT DETECTED Final   Parainfluenza Virus 2 NOT DETECTED NOT DETECTED Final   Parainfluenza Virus 3 NOT DETECTED NOT DETECTED Final   Parainfluenza Virus 4 NOT DETECTED NOT DETECTED Final   Respiratory Syncytial Virus NOT DETECTED NOT DETECTED Final   Bordetella pertussis NOT DETECTED NOT DETECTED Final   Chlamydophila pneumoniae NOT DETECTED NOT DETECTED Final   Mycoplasma pneumoniae NOT DETECTED NOT DETECTED Final    Comment: Performed at [facility], [place] [address], [place], [place] [address]  Culture, blood (routine x 2) Call MD if unable to obtain prior to antibiotics being given     Status: None (Preliminary result)   Collection Time: 06/12/18 10:58 PM  Result Value Ref Range Status   Specimen Description BLOOD LEFT ARM  Final   Special Requests   Final    BOTTLES DRAWN AEROBIC AND ANAEROBIC Blood Culture adequate volume   Culture   Final    NO GROWTH 3 DAYS Performed at [facility], [address], [place], [place] [address]    Report Status PENDING  Incomplete  MRSA PCR Screening     Status: None   Collection Time: 06/13/18 12:20 AM  Result Value Ref Range Status   MRSA by PCR NEGATIVE NEGATIVE Final    Comment:        The GeneXpert MRSA  Assay (FDA approved for NASAL specimens only), is one component of a comprehensive MRSA colonization surveillance program. It is not intended to diagnose MRSA infection nor to guide or monitor treatment for MRSA infections. Performed at [facility], [address] [place], [place] [address]     Coagulation Studies: No results for input(s): LABPROT, INR in the last 72 hours.  Urinalysis: Recent Labs    06/12/18 1356  COLORURINE YELLOW  LABSPEC 1.012  PHURINE 5.0  GLUCOSEU NEGATIVE  HGBUR MODERATE*  BILIRUBINUR NEGATIVE  KETONESUR NEGATIVE  PROTEINUR 30*  NITRITE NEGATIVE  LEUKOCYTESUR NEGATIVE      Imaging: Ct Chest Wo Contrast  Result Date: 06/14/2018 CLINICAL DATA:  58 year old male with history of chest pain and pleural effusion. EXAM: CT CHEST WITHOUT CONTRAST TECHNIQUE: Multidetector CT imaging of the chest was performed following the standard protocol without IV contrast. COMPARISON:  Chest CT 05/20/2016.  Chest x-ray 06/12/2018. FINDINGS: Cardiovascular: Heart size is normal. There is no significant pericardial fluid, thickening or pericardial calcification. Aortic atherosclerosis. No coronary artery calcifications. Mediastinum/Nodes: No pathologically enlarged mediastinal or hilar lymph nodes. Esophagus is unremarkable in appearance. No axillary lymphadenopathy. Lungs/Pleura: Multiple tiny 2-3 mm pulmonary nodules scattered throughout both lungs, several of which are calcified, compatible with benign granulomas. No larger more suspicious appearing pulmonary nodules or masses are noted. Moderate-sized bilateral pleural effusions lying dependently. This is associated with some mild passive atelectasis in the lower lobes of the lungs bilaterally. No acute consolidative airspace disease. Diffuse bronchial wall thickening with mild centrilobular and paraseptal emphysema. Upper Abdomen: Aortic atherosclerosis. Musculoskeletal: There are no aggressive appearing lytic or blastic  lesions noted in the visualized portions of the skeleton. IMPRESSION: 1. Moderate bilateral pleural effusions lying dependently with areas of passive subsegmental atelectasis in the lower lobes of the lungs bilaterally. 2. Multiple tiny 2-3 mm pulmonary nodules scattered throughout the lungs bilaterally, nonspecific, but similar to prior study from 05/28/2016, considered definitively benign. 3. Aortic atherosclerosis. 4. Diffuse bronchial wall thickening with mild centrilobular and paraseptal emphysema; imaging findings suggestive of underlying COPD. Aortic Atherosclerosis (ICD10-I70.0) and Emphysema (ICD10-J43.9). Electronically Signed   By: Vinnie Langton M.D.   On: 06/14/2018 10:53   Mr Thoracic Spine Wo Contrast  Result Date: 06/14/2018 CLINICAL DATA:  Mid low back pain for 2 months EXAM: MRI THORACIC AND LUMBAR SPINE WITHOUT CONTRAST TECHNIQUE: Multiplanar and multiecho pulse sequences of the thoracic and lumbar spine were obtained without intravenous contrast. COMPARISON:  Lumbar spine MRI 05/06/2018 FINDINGS: Severe patient motion degrading image quality limiting evaluation. Portion of the examination are nondiagnostic. MRI THORACIC SPINE FINDINGS Alignment:  Physiologic. Vertebrae: No fracture, evidence of discitis, or bone lesion. Cord:  Normal signal and morphology. Paraspinal and other soft tissues: No acute paraspinal abnormality. Disc levels: Disc spaces:  Disc spaces are relatively preserved. C6-7: Mild broad-based disc bulge. No foraminal or central canal stenosis. T1-T2: No disc protrusion, foraminal stenosis or central canal stenosis. T2-T3: No disc protrusion, foraminal stenosis or central canal stenosis. T3-T4: Broad-based disc bulge. Moderate bilateral facet arthropathy. Mild spinal stenosis. T4-T5: No disc protrusion, foraminal stenosis or central canal stenosis. Bilateral mild facet arthropathy. T5-T6: No disc protrusion, foraminal stenosis or central canal stenosis. T6-T7: No disc  protrusion, foraminal stenosis or central canal stenosis. T7-T8: No disc protrusion, foraminal stenosis or central canal stenosis. T8-T9: No disc protrusion, foraminal stenosis or central canal stenosis. T9-T10: No disc protrusion, foraminal stenosis or central canal stenosis. T10-T11: No disc protrusion, foraminal stenosis or central canal stenosis. T11-T12: No disc protrusion, foraminal stenosis or central canal stenosis. MRI LUMBAR SPINE FINDINGS Segmentation:  Standard. Alignment:  Physiologic. Vertebrae: No fracture, evidence of discitis, or aggressive bone lesion. Low marrow signal throughout the lumbar spine which is limited in evaluation secondary to patient motion. Conus medullaris and cauda equina: Conus extends to the T12 level. Conus and cauda equina appear normal. Paraspinal and other soft tissues: No acute paraspinal abnormality. Disc levels: Disc spaces: Posterior lumbar interbody fusion from L1 through L4. T12-L1: Minimal broad-based disc bulge. Mild bilateral facet arthropathy. No evidence  of neural foraminal stenosis. No central canal stenosis. L1-L2: Interbody fusion. No evidence of neural foraminal stenosis. No central canal stenosis. L2-L3: Interbody fusion. Evaluation is extremely limited secondary to patient motion degrading image quality. There is suggestion of persistent right central/right paracentral epidural soft tissue, but characterization is extremely difficult for given the degree of patient motion. Right foraminal stenosis. No central canal stenosis. L3-L4: Interbody fusion. No evidence of neural foraminal stenosis. No central canal stenosis. L4-L5: Minimal broad-based disc bulge. Mild bilateral facet scratch them moderate bilateral facet arthropathy. Mild left foraminal stenosis. No right foraminal stenosis. No central canal stenosis. L5-S1: Broad-based disc bulge. Moderate bilateral facet arthropathy. Mild left foraminal stenosis. No significant right foraminal stenosis. No central  canal stenosis. IMPRESSION: Severe patient motion degrading image quality limiting evaluation. Portion of the examination are nondiagnostic. MR THORACIC SPINE IMPRESSION 1. No aggressive osseous lesion to suggest malignancy. 2.  No acute osseous injury of the thoracic spine. MR LUMBAR SPINE IMPRESSION 1. L2-3 is extremely limited in evaluation secondary to patient motion degrading image quality. There is suggestion of persistent right central/right paracentral epidural soft tissue, but characterization is extremely difficult for given the degree of patient motion. Right foraminal stenosis. Recommend repeat MRI of the lumbar spine when the patient is able to better tolerate the examination with less patient motion. 2.  No acute osseous injury of the lumbar spine. Electronically Signed   By: Kathreen Devoid   On: 06/14/2018 19:51   Mr Lumbar Spine Wo Contrast  Result Date: 06/14/2018 CLINICAL DATA:  Mid low back pain for 2 months EXAM: MRI THORACIC AND LUMBAR SPINE WITHOUT CONTRAST TECHNIQUE: Multiplanar and multiecho pulse sequences of the thoracic and lumbar spine were obtained without intravenous contrast. COMPARISON:  Lumbar spine MRI 05/06/2018 FINDINGS: Severe patient motion degrading image quality limiting evaluation. Portion of the examination are nondiagnostic. MRI THORACIC SPINE FINDINGS Alignment:  Physiologic. Vertebrae: No fracture, evidence of discitis, or bone lesion. Cord:  Normal signal and morphology. Paraspinal and other soft tissues: No acute paraspinal abnormality. Disc levels: Disc spaces:  Disc spaces are relatively preserved. C6-7: Mild broad-based disc bulge. No foraminal or central canal stenosis. T1-T2: No disc protrusion, foraminal stenosis or central canal stenosis. T2-T3: No disc protrusion, foraminal stenosis or central canal stenosis. T3-T4: Broad-based disc bulge. Moderate bilateral facet arthropathy. Mild spinal stenosis. T4-T5: No disc protrusion, foraminal stenosis or central canal  stenosis. Bilateral mild facet arthropathy. T5-T6: No disc protrusion, foraminal stenosis or central canal stenosis. T6-T7: No disc protrusion, foraminal stenosis or central canal stenosis. T7-T8: No disc protrusion, foraminal stenosis or central canal stenosis. T8-T9: No disc protrusion, foraminal stenosis or central canal stenosis. T9-T10: No disc protrusion, foraminal stenosis or central canal stenosis. T10-T11: No disc protrusion, foraminal stenosis or central canal stenosis. T11-T12: No disc protrusion, foraminal stenosis or central canal stenosis. MRI LUMBAR SPINE FINDINGS Segmentation:  Standard. Alignment:  Physiologic. Vertebrae: No fracture, evidence of discitis, or aggressive bone lesion. Low marrow signal throughout the lumbar spine which is limited in evaluation secondary to patient motion. Conus medullaris and cauda equina: Conus extends to the T12 level. Conus and cauda equina appear normal. Paraspinal and other soft tissues: No acute paraspinal abnormality. Disc levels: Disc spaces: Posterior lumbar interbody fusion from L1 through L4. T12-L1: Minimal broad-based disc bulge. Mild bilateral facet arthropathy. No evidence of neural foraminal stenosis. No central canal stenosis. L1-L2: Interbody fusion. No evidence of neural foraminal stenosis. No central canal stenosis. L2-L3: Interbody fusion. Evaluation is extremely limited secondary  to patient motion degrading image quality. There is suggestion of persistent right central/right paracentral epidural soft tissue, but characterization is extremely difficult for given the degree of patient motion. Right foraminal stenosis. No central canal stenosis. L3-L4: Interbody fusion. No evidence of neural foraminal stenosis. No central canal stenosis. L4-L5: Minimal broad-based disc bulge. Mild bilateral facet scratch them moderate bilateral facet arthropathy. Mild left foraminal stenosis. No right foraminal stenosis. No central canal stenosis. L5-S1: Broad-based  disc bulge. Moderate bilateral facet arthropathy. Mild left foraminal stenosis. No significant right foraminal stenosis. No central canal stenosis. IMPRESSION: Severe patient motion degrading image quality limiting evaluation. Portion of the examination are nondiagnostic. MR THORACIC SPINE IMPRESSION 1. No aggressive osseous lesion to suggest malignancy. 2.  No acute osseous injury of the thoracic spine. MR LUMBAR SPINE IMPRESSION 1. L2-3 is extremely limited in evaluation secondary to patient motion degrading image quality. There is suggestion of persistent right central/right paracentral epidural soft tissue, but characterization is extremely difficult for given the degree of patient motion. Right foraminal stenosis. Recommend repeat MRI of the lumbar spine when the patient is able to better tolerate the examination with less patient motion. 2.  No acute osseous injury of the lumbar spine. Electronically Signed   By: Kathreen Devoid   On: 06/14/2018 19:51   US Renal  Result Date: 06/14/2018 CLINICAL DATA:  Acute on chronic renal failure EXAM: RENAL / URINARY TRACT ULTRASOUND COMPLETE COMPARISON:  06/12/2018 FINDINGS: Right Kidney: Renal measurements: 11.6 x 4.9 x 7.6 cm = volume: 224 mL. Diffuse increased echogenicity is noted. No mass or hydronephrosis is noted. Left Kidney: Renal measurements: 11.9 x 6.5 x 5.5 cm = volume: 222 mL. Increased echogenicity is noted. No mass lesion or hydronephrosis is seen. Bladder: Appears normal for degree of bladder distention. Bilateral pleural effusions are noted similar to that seen on prior CT. IMPRESSION: Diffuse increased echogenicity consistent with the given clinical history. Bilateral small effusions. Electronically Signed   By: Inez Catalina M.D.   On: 06/14/2018 10:30   Dg Bone Survey Met  Result Date: 06/15/2018 CLINICAL DATA:  58 year old male with a history of hypercalcemia EXAM: METASTATIC BONE SURVEY COMPARISON:  Chest CT 06/14/2018, plain film 06/12/2018,  lumbar CT 05/06/2018 FINDINGS: Skull: No acute fracture. No lytic or sclerotic lesion identified.  Endodontal disease. Cervical spine: Surgical changes of anterior cervical discectomy infusion with anterior plate screw fixation E2-C0 with no complicating features. Degenerative changes at the levels above and below. No lytic lesions or sclerotic lesions.  Alignment maintained. Thoracic spine: Vertebral body heights maintained with alignment. Mild disc disease of the thoracic spine. No acute fracture. No lytic or sclerotic lesions. Unremarkable appearance of the pedicles. Lumbar spine: Surgical changes of posterior lumbar interbody fusion with bilateral pedicle screw and rod fixation spanning L1-L4. No complicating features. No acute fracture. The lytic changes that were identified on prior CT are not visualized on the current plain film. Moderate disc disease and facet disease of the lumbar spine. No lytic or sclerotic lesions identified. Chest: Cardiomediastinal silhouette within normal limits. Blunting of the bilateral costophrenic angles with obscuration of the hemidiaphragms bilaterally. No interlobular septal thickening. No rib fracture identified. Pelvis: No acute fracture. Sclerotic changes at the inferior aspect of the left sacroiliac joint on the iliac aspect, similar to prior CT abdomen. No lytic changes. Degenerative changes of the hips. Right upper extremity: No acute fracture.  No lucent lesion or sclerotic focus. Left upper extremity: No acute fracture.  No lucent lesion or sclerotic  changes. Right lower extremity: No acute fracture.  No no lucent lesion or sclerotic lesion. Left lower extremity: No acute fracture.  No lucent lesion or sclerotic lesion. IMPRESSION: Abnormal lucent changes identified in the lumbar spine on the prior CT dated 05/06/2018 are not visualized on the current plain film. If concern for malignancy, correlation with either nuclear medicine bone scan or PET-CT may be useful.  Sclerotic focus at the inferior aspect of the left sacral iliac joint, present on prior CT pelvis. If concern for malignancy, correlation with either nuclear medicine bone scan or PET-CT may be useful. Bilateral small pleural effusions, present on prior CT. Electronically Signed   By: Corrie Mckusick D.O.   On: 06/15/2018 09:22     Medications:   . sodium chloride 125 mL/hr at 06/15/18 0918   . calcitonin  4 Units/kg Subcutaneous BID  . feeding supplement (ENSURE ENLIVE)  237 mL Oral BID BM  . folic acid  1 mg Oral Daily  . furosemide  40 mg Intravenous Q6H  . lidocaine  1 patch Transdermal Q24H  . sodium bicarbonate  1,300 mg Oral TID   traMADol  Assessment/ Plan:   Acute kidney injury.  Elevated protein creatinine ratio.  I think this picture fits with multiple myeloma.  Myeloma kidney.  I do not see the need for renal biopsy unless oncology feels strongly prior to initiating therapy.  I think a bone marrow biopsy would be important to establish severity of disease and treatment options.  Continue to avoid nonsteroidal inflammatories ACE inhibitors ARB use and IV contrast  Hypercalcemia PTH appropriately suppressed continue same diuresis.  Calcium improving.  Agree with calcitonin.  Anemia iron studies in normal range.  Will defer to oncology.  Patient to receive folic acid  Hyponatremia slightly improved  Metabolic acidosis improving with sodium bicarbonate   LOS: 3 Sherril Croon @TODAY @10 :51 AM

## 2018-06-15 NOTE — Progress Notes (Addendum)
PROGRESS NOTE  [name] GYJ:[id number] DOB: June 08, 1960 DOA: 06/12/2018 PCP: Patient, No Pcp Per  Brief History:  58 year old male with a history of COPD, testicular cancer, substance abuse, spinal stenosis and lumbar radiculopathy status post fusion 07/24/2015 [facility]) presenting with abdominal pain radiating to the bilateral flanks as well as nausea without any emesis or diarrhea.  He denies any fevers, chills, chest pain, shortness of breath, coughing. Unfortunately, patient is a difficult historian and tends to be tangential.  Nevertheless, he has also been complaining of worsening lower back pain and leg weakness.  He has not fallen or had any syncope.  He has been taking over-the-counter NSAIDs as well as tramadol.  Patient had MRI of the lumbar spine on 05/06/2018 which showed abnormal right prevertebral and epidural soft tissue at L2-3 concerning for tumor infection.  There is also right L3 nerve impingement.  There is moderate L4-5 canal stenosis with moderate to severe L2-3 narrowing.  The patient subsequently had L2 nerve block performed on 05/27/2018. Upon presentation, the patient was noted to have hypercalcemia with a corrected calcium of 13.1.  The patient was started on IV fluids with some improvement of his serum calcium.  Subsequently, the patient was started on calcitonin.  Renal and heme/onc were consulted to assist with management.  Assessment/Plan: AKI -Secondary to NSAIDs as well as hypercalcemia -Renal ultrasound--neg hydronephrosis -SPEP--positive M-spike in gamma region -Immunofixation--pending -serum kappa/lambda light chains--pending -baseline creatinine ~1.0 -urine protein/creatinine ratio--5.29 -appreciate nephrology follow up  Hypercalcemia -Intact PTH--9 -likely due to underlying malignancy -Continue IV fluids as serum calcium is improving -SPEP--positive M-spike in gamma region -Immunofixation--pending -serum kappa/lambda light  chains--pending -heme/onc consult appreciated -start calcitonin Leon  Lumbar radiculopathy -05/06/2018 MRI L-spine--diffusely abnormal bone marrow signal concerning for myeloproliferative disorder versus infiltrative metastatic disease.  Abnormal prevertebral tissue as discussed above at L2-3 -3/16&3/17--discussed with neurosurgery, Dr. Melynda Keller nonoperative management for radiculopathy -06/15/18--discussed with IR (Dr. Gaye Pollack for needle bx of L2-3 soft tissue-->placed order in EPIC -3/16 MR T-spine--no acute findings 3/16 MR L-spine--motion degraded with persistent paracentral soft tissue -06/15/18--personally ambulated pt--pt able to ambulate with minimal to no assist  Bilateral pleural effusions -Echocardiogram--probably normal EF, poor quality -Urine protein creatinine ratio--5.29 -Stable on room air  Proteinuria -pt has nephrotic range proteinuria -has contributed to pleural effusions -urine protein/creatinine ratio 5.29 -nephrology following  COPD -Stable on room air -Patient states that he quit smoking 1 month ago  Pulmonary opacity -CT abdomen and pelvis showed consolidation in the posterior left lower lobe -Patient has no fever or shortness of breath -Viral respiratory panel shows rhinovirus -Check procalcitonin--7.71 -CT chest--no consolidation, moderate bilateral pleural effusion with atelectasis.  Multiple tiny bilateral nodules. -d/c ceftriaxone and azithromycin  Abdominal Pain -improving after BM -3/14 CT abd--mid sigmoid wall thickening without surrounding inflammation--?muscular hypertrophy -GI consult  Macrocytic anemia -Y63--785 -folic YIFO--2.7 -iron saturation 35%, ferritin 62 -transfused 2 units 06/14/18 -started folate supplementation    Disposition Plan:   Not stable for d/c Family Communication:  daughter updated at bedside 3/17  Consultants:  Renal/heme/onc  Code Status:  FULL   DVT Prophylaxis:  [place] Heparin     Procedures: As Listed in Progress Note Above  Antibiotics: Ceftriaxone 3/14>>>3/16 azithrommycin 3/14>>>3/16  Total time spent 45 minutes.  Greater than 50% spent face to face counseling and coordinating care.    Subjective: Pt denies f/c, cp, sob, n/v/d, abd pain.   Back pain about same as one month ago, but  feels that right leg is a little weaker.  Able to ambulate with walker.    Objective: Vitals:   06/14/18 1830 06/14/18 2125 06/14/18 2139 06/15/18 0549  BP: 114/73 (!) 118/50  130/74  Pulse: (!) 116 (!) 106  (!) 101  Resp: 18 19  18   Temp: 98.8 F (37.1 C)  (!) 97.3 F (36.3 C) 98.6 F (37 C)  TempSrc: Oral  Oral Oral  SpO2: 98% 99%  99%  Weight:      Height:        Intake/Output Summary (Last 24 hours) at 06/15/2018 0818 Last data filed at 06/15/2018 0550 Gross per 24 hour  Intake 3492.01 ml  Output 3050 ml  Net 442.01 ml   Weight change:  Exam:   General:  Pt is alert, follows commands appropriately, not in acute distress  HEENT: No icterus, No thrush, No neck mass, [place]/AT  Cardiovascular: RRR, S1/S2, no rubs, no gallops  Respiratory: bibasilar crackles, no wheeze  Abdomen: Soft/+BS, non tender, non distended, no guarding  Extremities: No edema, No lymphangitis, No petechiae, No rashes, no synovitis  Neuro:  CN II-XII intact, strength 4-/5 in RUE, RLE, strength 4/5 LUE, LLE; sensation intact bilateral; no dysmetria; babinski equivocal; pt ambulated with minimal to no assist     Data Reviewed: I have personally reviewed following labs and imaging studies Basic Metabolic Panel: Recent Labs  Lab 06/12/18 1325 06/12/18 2240 06/13/18 0639 06/14/18 0548 06/15/18 0534  NA 132* 131* 134* 135 134*  K 4.3 4.4 4.5 4.9 4.6  CL 98 102 105 108 101  CO2 16* 16* 16* 16* 18*  GLUCOSE 101* 99 83 82 80  BUN 95* 85* 85* 71* 63*  CREATININE 6.14* 5.69* 5.48* 5.14* 4.92*  CALCIUM 13.1* 12.2*  12.3* 12.3* 12.2* 12.9*  PHOS  --   --   --   --  6.0*    Liver Function Tests: Recent Labs  Lab 06/12/18 1325 06/15/18 0534  AST 20 22  ALT 23 19  ALKPHOS 99 82  BILITOT 0.6 0.3  PROT 7.8 7.1  ALBUMIN 4.4 3.8  3.8   Recent Labs  Lab 06/12/18 1325  LIPASE 33   No results for input(s): AMMONIA in the last 168 hours. Coagulation Profile: No results for input(s): INR, PROTIME in the last 168 hours. CBC: Recent Labs  Lab 06/12/18 1325 06/12/18 2240 06/13/18 0639 06/14/18 0548 06/15/18 0534  WBC 10.6*  --  9.6 10.7* 12.0*  NEUTROABS  --  5.4 3.6 4.2 3.9  HGB 8.3*  --  7.3* 7.0* 9.8*  HCT 25.6*  --  22.9* 22.5* 29.7*  MCV 107.6*  --  111.2* 111.4* 100.3*  PLT 164  --  150 165 150   Cardiac Enzymes: Recent Labs  Lab 06/14/18 0949  CKTOTAL 50   BNP: Invalid input(s): POCBNP CBG: No results for input(s): GLUCAP in the last 168 hours. HbA1C: No results for input(s): HGBA1C in the last 72 hours. Urine analysis:    Component Value Date/Time   COLORURINE YELLOW 06/12/2018 1356   APPEARANCEUR CLEAR 06/12/2018 1356   LABSPEC 1.012 06/12/2018 1356   PHURINE 5.0 06/12/2018 1356   GLUCOSEU NEGATIVE 06/12/2018 1356   HGBUR MODERATE (A) 06/12/2018 1356   BILIRUBINUR NEGATIVE 06/12/2018 1356   KETONESUR NEGATIVE 06/12/2018 1356   PROTEINUR 30 (A) 06/12/2018 1356   UROBILINOGEN 0.2 02/09/2008 2042   NITRITE NEGATIVE 06/12/2018 1356   LEUKOCYTESUR NEGATIVE 06/12/2018 1356   Sepsis Labs: @LABRCNTIP (procalcitonin:4,lacticidven:4) ) Recent Results (from  the past 240 hour(s))  Culture, blood (routine x 2) Call MD if unable to obtain prior to antibiotics being given     Status: None (Preliminary result)   Collection Time: 06/12/18 10:41 PM  Result Value Ref Range Status   Specimen Description BLOOD RIGHT HAND  Final   Special Requests   Final    BOTTLES DRAWN AEROBIC AND ANAEROBIC Blood Culture adequate volume   Culture   Final    NO GROWTH 3 DAYS Performed at [facility], [address], [place], [place] [address]     Report Status PENDING  Incomplete  Respiratory Panel by PCR     Status: Abnormal   Collection Time: 06/12/18 10:44 PM  Result Value Ref Range Status   Adenovirus NOT DETECTED NOT DETECTED Final   Coronavirus 229E NOT DETECTED NOT DETECTED Final    Comment: (NOTE) The Coronavirus on the Respiratory Panel, DOES NOT test for the novel  Coronavirus (2019 nCoV)    Coronavirus HKU1 NOT DETECTED NOT DETECTED Final   Coronavirus NL63 NOT DETECTED NOT DETECTED Final   Coronavirus OC43 NOT DETECTED NOT DETECTED Final   Metapneumovirus NOT DETECTED NOT DETECTED Final   Rhinovirus / Enterovirus DETECTED (A) NOT DETECTED Final   Influenza A NOT DETECTED NOT DETECTED Final   Influenza B NOT DETECTED NOT DETECTED Final   Parainfluenza Virus 1 NOT DETECTED NOT DETECTED Final   Parainfluenza Virus 2 NOT DETECTED NOT DETECTED Final   Parainfluenza Virus 3 NOT DETECTED NOT DETECTED Final   Parainfluenza Virus 4 NOT DETECTED NOT DETECTED Final   Respiratory Syncytial Virus NOT DETECTED NOT DETECTED Final   Bordetella pertussis NOT DETECTED NOT DETECTED Final   Chlamydophila pneumoniae NOT DETECTED NOT DETECTED Final   Mycoplasma pneumoniae NOT DETECTED NOT DETECTED Final    Comment: Performed at [facility], [place] [address], [place], [place] [address]  Culture, blood (routine x 2) Call MD if unable to obtain prior to antibiotics being given     Status: None (Preliminary result)   Collection Time: 06/12/18 10:58 PM  Result Value Ref Range Status   Specimen Description BLOOD LEFT ARM  Final   Special Requests   Final    BOTTLES DRAWN AEROBIC AND ANAEROBIC Blood Culture adequate volume   Culture   Final    NO GROWTH 3 DAYS Performed at [facility], [address], [place], [place] [address]    Report Status PENDING  Incomplete  MRSA PCR Screening     Status: None   Collection Time: 06/13/18 12:20 AM  Result Value Ref Range Status   MRSA by PCR NEGATIVE NEGATIVE Final    Comment:        The  GeneXpert MRSA Assay (FDA approved for NASAL specimens only), is one component of a comprehensive MRSA colonization surveillance program. It is not intended to diagnose MRSA infection nor to guide or monitor treatment for MRSA infections. Performed at [facility], [address] [place], [place] [address]      Scheduled Meds: . calcitonin  4 Units/kg Subcutaneous BID  . feeding supplement (ENSURE ENLIVE)  237 mL Oral BID BM  . folic acid  1 mg Intravenous Daily  . furosemide  40 mg Intravenous Q6H  . lidocaine  1 patch Transdermal Q24H  . sodium bicarbonate  1,300 mg Oral TID   Continuous Infusions: . sodium chloride Stopped (06/15/18 0811)    Procedures/Studies: Ct Abdomen Pelvis Wo Contrast  Result Date: 06/12/2018 CLINICAL DATA:  Abdominal pain  and fever EXAM: CT ABDOMEN AND PELVIS WITHOUT CONTRAST TECHNIQUE: Multidetector CT imaging of the abdomen and pelvis was performed following the standard protocol without IV contrast. Oral contrast was administered. COMPARISON:  Aug 12, 2012 FINDINGS: Lower chest: There are free-flowing pleural effusions bilaterally with bibasilar atelectasis. There is questionable mild consolidation in the posterior left lung base as well. Hepatobiliary: No focal liver lesions are appreciable on this noncontrast enhanced study. The gallbladder wall is not appreciably thickened. There is no biliary duct dilatation. Pancreas: No pancreatic mass or inflammatory focus. Spleen: No splenic lesions are evident. Adrenals/Urinary Tract: Adrenals bilaterally appear unremarkable. Kidneys bilaterally show no evident mass or hydronephrosis on either side. There is no appreciable renal or ureteral calculus on either side. Urinary bladder is midline with wall thickness within normal limits. Stomach/Bowel: There are sigmoid diverticula without overt diverticulitis. There is wall thickening in portions of the mid sigmoid colon without surrounding inflammation. Suspect muscular  hypertrophy in this area from chronic diverticulosis. No bowel wall thickening is noted elsewhere. No bowel obstruction appreciable. No free air or portal venous air. Vascular/Lymphatic: There is aortic and bilateral iliac artery atherosclerosis. No aneurysm evident. There is no appreciable adenopathy in the abdomen or pelvis. Reproductive: Prostate and seminal vesicles appear normal in size and contour. There is no appreciable pelvic mass. Other: Appendix appears normal. No abscess or ascites is evident in the abdomen or pelvis. Musculoskeletal: There is extensive postoperative change from L1-L4. There is arthropathy throughout the lumbar region. There are no blastic or lytic bone lesions. There is no intramuscular or abdominal wall lesion evident. IMPRESSION: 1. Moderate free-flowing pleural effusions bilaterally with bibasilar atelectasis. Question a degree of superimposed pneumonia in the posterior left base. 2. Wall thickening in the mid sigmoid colon, likely due to muscular hypertrophy from chronic diverticulosis. Earliest changes of diverticulitis in this area are difficult to entirely exclude. No overt diverticulitis is evident. 3. No bowel obstruction. Appendix appears normal. No evident abscess in the abdomen or pelvis. 4. No evident renal or ureteral calculus. No evident hydronephrosis on either side. 5.  Aortoiliac atherosclerosis. 6.  Extensive postoperative change throughout the lumbar spine. Electronically Signed   By: Lowella Grip III M.D.   On: 06/12/2018 17:14   Dg Chest 2 View  Result Date: 06/12/2018 CLINICAL DATA:  Stomach pain for a week. No vomiting or diarrhea. Fever. EXAM: CHEST - 2 VIEW COMPARISON:  CT chest 05/28/2016. Chest 11/04/2010 FINDINGS: Normal heart size and pulmonary vascularity. Small left pleural effusion with infiltration in the left lung base. This suggests pneumonia. Right lung is clear. No pneumothorax. Mediastinal contours appear intact. IMPRESSION: Small left  pleural effusion with infiltration in the left lung base suggesting pneumonia. Electronically Signed   By: Lucienne Capers M.D.   On: 06/12/2018 21:18   Ct Chest Wo Contrast  Result Date: 06/14/2018 CLINICAL DATA:  58 year old male with history of chest pain and pleural effusion. EXAM: CT CHEST WITHOUT CONTRAST TECHNIQUE: Multidetector CT imaging of the chest was performed following the standard protocol without IV contrast. COMPARISON:  Chest CT 05/20/2016.  Chest x-ray 06/12/2018. FINDINGS: Cardiovascular: Heart size is normal. There is no significant pericardial fluid, thickening or pericardial calcification. Aortic atherosclerosis. No coronary artery calcifications. Mediastinum/Nodes: No pathologically enlarged mediastinal or hilar lymph nodes. Esophagus is unremarkable in appearance. No axillary lymphadenopathy. Lungs/Pleura: Multiple tiny 2-3 mm pulmonary nodules scattered throughout both lungs, several of which are calcified, compatible with benign granulomas. No larger more suspicious appearing pulmonary nodules or masses are  noted. Moderate-sized bilateral pleural effusions lying dependently. This is associated with some mild passive atelectasis in the lower lobes of the lungs bilaterally. No acute consolidative airspace disease. Diffuse bronchial wall thickening with mild centrilobular and paraseptal emphysema. Upper Abdomen: Aortic atherosclerosis. Musculoskeletal: There are no aggressive appearing lytic or blastic lesions noted in the visualized portions of the skeleton. IMPRESSION: 1. Moderate bilateral pleural effusions lying dependently with areas of passive subsegmental atelectasis in the lower lobes of the lungs bilaterally. 2. Multiple tiny 2-3 mm pulmonary nodules scattered throughout the lungs bilaterally, nonspecific, but similar to prior study from 05/28/2016, considered definitively benign. 3. Aortic atherosclerosis. 4. Diffuse bronchial wall thickening with mild centrilobular and  paraseptal emphysema; imaging findings suggestive of underlying COPD. Aortic Atherosclerosis (ICD10-I70.0) and Emphysema (ICD10-J43.9). Electronically Signed   By: Vinnie Langton M.D.   On: 06/14/2018 10:53   Mr Thoracic Spine Wo Contrast  Result Date: 06/14/2018 CLINICAL DATA:  Mid low back pain for 2 months EXAM: MRI THORACIC AND LUMBAR SPINE WITHOUT CONTRAST TECHNIQUE: Multiplanar and multiecho pulse sequences of the thoracic and lumbar spine were obtained without intravenous contrast. COMPARISON:  Lumbar spine MRI 05/06/2018 FINDINGS: Severe patient motion degrading image quality limiting evaluation. Portion of the examination are nondiagnostic. MRI THORACIC SPINE FINDINGS Alignment:  Physiologic. Vertebrae: No fracture, evidence of discitis, or bone lesion. Cord:  Normal signal and morphology. Paraspinal and other soft tissues: No acute paraspinal abnormality. Disc levels: Disc spaces:  Disc spaces are relatively preserved. C6-7: Mild broad-based disc bulge. No foraminal or central canal stenosis. T1-T2: No disc protrusion, foraminal stenosis or central canal stenosis. T2-T3: No disc protrusion, foraminal stenosis or central canal stenosis. T3-T4: Broad-based disc bulge. Moderate bilateral facet arthropathy. Mild spinal stenosis. T4-T5: No disc protrusion, foraminal stenosis or central canal stenosis. Bilateral mild facet arthropathy. T5-T6: No disc protrusion, foraminal stenosis or central canal stenosis. T6-T7: No disc protrusion, foraminal stenosis or central canal stenosis. T7-T8: No disc protrusion, foraminal stenosis or central canal stenosis. T8-T9: No disc protrusion, foraminal stenosis or central canal stenosis. T9-T10: No disc protrusion, foraminal stenosis or central canal stenosis. T10-T11: No disc protrusion, foraminal stenosis or central canal stenosis. T11-T12: No disc protrusion, foraminal stenosis or central canal stenosis. MRI LUMBAR SPINE FINDINGS Segmentation:  Standard. Alignment:   Physiologic. Vertebrae: No fracture, evidence of discitis, or aggressive bone lesion. Low marrow signal throughout the lumbar spine which is limited in evaluation secondary to patient motion. Conus medullaris and cauda equina: Conus extends to the T12 level. Conus and cauda equina appear normal. Paraspinal and other soft tissues: No acute paraspinal abnormality. Disc levels: Disc spaces: Posterior lumbar interbody fusion from L1 through L4. T12-L1: Minimal broad-based disc bulge. Mild bilateral facet arthropathy. No evidence of neural foraminal stenosis. No central canal stenosis. L1-L2: Interbody fusion. No evidence of neural foraminal stenosis. No central canal stenosis. L2-L3: Interbody fusion. Evaluation is extremely limited secondary to patient motion degrading image quality. There is suggestion of persistent right central/right paracentral epidural soft tissue, but characterization is extremely difficult for given the degree of patient motion. Right foraminal stenosis. No central canal stenosis. L3-L4: Interbody fusion. No evidence of neural foraminal stenosis. No central canal stenosis. L4-L5: Minimal broad-based disc bulge. Mild bilateral facet scratch them moderate bilateral facet arthropathy. Mild left foraminal stenosis. No right foraminal stenosis. No central canal stenosis. L5-S1: Broad-based disc bulge. Moderate bilateral facet arthropathy. Mild left foraminal stenosis. No significant right foraminal stenosis. No central canal stenosis. IMPRESSION: Severe patient motion degrading image quality limiting evaluation.  Portion of the examination are nondiagnostic. MR THORACIC SPINE IMPRESSION 1. No aggressive osseous lesion to suggest malignancy. 2.  No acute osseous injury of the thoracic spine. MR LUMBAR SPINE IMPRESSION 1. L2-3 is extremely limited in evaluation secondary to patient motion degrading image quality. There is suggestion of persistent right central/right paracentral epidural soft tissue, but  characterization is extremely difficult for given the degree of patient motion. Right foraminal stenosis. Recommend repeat MRI of the lumbar spine when the patient is able to better tolerate the examination with less patient motion. 2.  No acute osseous injury of the lumbar spine. Electronically Signed   By: Kathreen Devoid   On: 06/14/2018 19:51   Mr Lumbar Spine Wo Contrast  Result Date: 06/14/2018 CLINICAL DATA:  Mid low back pain for 2 months EXAM: MRI THORACIC AND LUMBAR SPINE WITHOUT CONTRAST TECHNIQUE: Multiplanar and multiecho pulse sequences of the thoracic and lumbar spine were obtained without intravenous contrast. COMPARISON:  Lumbar spine MRI 05/06/2018 FINDINGS: Severe patient motion degrading image quality limiting evaluation. Portion of the examination are nondiagnostic. MRI THORACIC SPINE FINDINGS Alignment:  Physiologic. Vertebrae: No fracture, evidence of discitis, or bone lesion. Cord:  Normal signal and morphology. Paraspinal and other soft tissues: No acute paraspinal abnormality. Disc levels: Disc spaces:  Disc spaces are relatively preserved. C6-7: Mild broad-based disc bulge. No foraminal or central canal stenosis. T1-T2: No disc protrusion, foraminal stenosis or central canal stenosis. T2-T3: No disc protrusion, foraminal stenosis or central canal stenosis. T3-T4: Broad-based disc bulge. Moderate bilateral facet arthropathy. Mild spinal stenosis. T4-T5: No disc protrusion, foraminal stenosis or central canal stenosis. Bilateral mild facet arthropathy. T5-T6: No disc protrusion, foraminal stenosis or central canal stenosis. T6-T7: No disc protrusion, foraminal stenosis or central canal stenosis. T7-T8: No disc protrusion, foraminal stenosis or central canal stenosis. T8-T9: No disc protrusion, foraminal stenosis or central canal stenosis. T9-T10: No disc protrusion, foraminal stenosis or central canal stenosis. T10-T11: No disc protrusion, foraminal stenosis or central canal stenosis.  T11-T12: No disc protrusion, foraminal stenosis or central canal stenosis. MRI LUMBAR SPINE FINDINGS Segmentation:  Standard. Alignment:  Physiologic. Vertebrae: No fracture, evidence of discitis, or aggressive bone lesion. Low marrow signal throughout the lumbar spine which is limited in evaluation secondary to patient motion. Conus medullaris and cauda equina: Conus extends to the T12 level. Conus and cauda equina appear normal. Paraspinal and other soft tissues: No acute paraspinal abnormality. Disc levels: Disc spaces: Posterior lumbar interbody fusion from L1 through L4. T12-L1: Minimal broad-based disc bulge. Mild bilateral facet arthropathy. No evidence of neural foraminal stenosis. No central canal stenosis. L1-L2: Interbody fusion. No evidence of neural foraminal stenosis. No central canal stenosis. L2-L3: Interbody fusion. Evaluation is extremely limited secondary to patient motion degrading image quality. There is suggestion of persistent right central/right paracentral epidural soft tissue, but characterization is extremely difficult for given the degree of patient motion. Right foraminal stenosis. No central canal stenosis. L3-L4: Interbody fusion. No evidence of neural foraminal stenosis. No central canal stenosis. L4-L5: Minimal broad-based disc bulge. Mild bilateral facet scratch them moderate bilateral facet arthropathy. Mild left foraminal stenosis. No right foraminal stenosis. No central canal stenosis. L5-S1: Broad-based disc bulge. Moderate bilateral facet arthropathy. Mild left foraminal stenosis. No significant right foraminal stenosis. No central canal stenosis. IMPRESSION: Severe patient motion degrading image quality limiting evaluation. Portion of the examination are nondiagnostic. MR THORACIC SPINE IMPRESSION 1. No aggressive osseous lesion to suggest malignancy. 2.  No acute osseous injury of the thoracic spine. MR  LUMBAR SPINE IMPRESSION 1. L2-3 is extremely limited in evaluation  secondary to patient motion degrading image quality. There is suggestion of persistent right central/right paracentral epidural soft tissue, but characterization is extremely difficult for given the degree of patient motion. Right foraminal stenosis. Recommend repeat MRI of the lumbar spine when the patient is able to better tolerate the examination with less patient motion. 2.  No acute osseous injury of the lumbar spine. Electronically Signed   By: Kathreen Devoid   On: 06/14/2018 19:51   US Renal  Result Date: 06/14/2018 CLINICAL DATA:  Acute on chronic renal failure EXAM: RENAL / URINARY TRACT ULTRASOUND COMPLETE COMPARISON:  06/12/2018 FINDINGS: Right Kidney: Renal measurements: 11.6 x 4.9 x 7.6 cm = volume: 224 mL. Diffuse increased echogenicity is noted. No mass or hydronephrosis is noted. Left Kidney: Renal measurements: 11.9 x 6.5 x 5.5 cm = volume: 222 mL. Increased echogenicity is noted. No mass lesion or hydronephrosis is seen. Bladder: Appears normal for degree of bladder distention. Bilateral pleural effusions are noted similar to that seen on prior CT. IMPRESSION: Diffuse increased echogenicity consistent with the given clinical history. Bilateral small effusions. Electronically Signed   By: Inez Catalina M.D.   On: 06/14/2018 10:30   Dg Inject Diag/thera/inc Needle/cath/plc Epi/lumb/sac W/img  Result Date: 05/27/2018 CLINICAL DATA:  Previous fusion surgery. Back and right lower extremity pain. EXAM: SELECTIVE NERVE ROOT BLOCK AND TRANSFORAMINAL EPIDURAL STEROID INJECTION UNDER FLUOROSCOPY FLUOROSCOPY TIME:  43 seconds; 47 uGym2 DAP TECHNIQUE: The procedure, risks (including but not limited to bleeding, infection, organ damage ), benefits, and alternatives were explained to the patient. Questions regarding the procedure were encouraged and answered. The patient understands and consents to the procedure. An appropriate skin entry site was determined under fluoroscopy. Operator donned sterile gloves  and mask. Site was marked, prepped with Betadine, draped in usual sterile fashion, infiltrated locally with 1% lidocaine. A 22 gauge spinal needle was advanced to the superior ventral margin of the right L2-3 neural foramen. Diagnostic injection of 2 ml Omnipaque 180 showed partial outlining of the exiting nerve root as well as minimal epidural extension of contrast, with no intravascular or subarachnoid component. 120 mg Depo-Medrol in 3 ml lidocaine 1% was administered. The patient tolerated procedure well, with no immediate complication. IMPRESSION: 1. Technically successful right L2 selective nerve root block and transforaminal epidural steroid injection Electronically Signed   By: Lucrezia Europe M.D.   On: 05/27/2018 15:28    Orson Eva, DO  [facility] Pager [phone]  If 7PM-7AM, please contact night-coverage [email or web address] Password [email or web address] 06/15/2018, 8:18 AM   LOS: 3 days

## 2018-06-15 NOTE — Consult Note (Signed)
[facility] Oncology Progress Note  Name: Andrea Colglazier      MRN: [id number]    Location: [id number]/[id number]  Date: 06/15/2018 Time:3:30 PM   Subjective: Interval History:Rigby Raimondo is seen today while he is lying in the bed.  His daughter and her mother are at bedside.  He continues to report some back pain which is stable.  He is receiving normal saline at 125 mL/h.  Denies any changes in his back pain.  He underwent MRI of the thoracic and lumbar spine yesterday.  He also underwent skeletal survey.  Objective: Vital signs in last 24 hours: Temp:  [97.3 F (36.3 C)-98.8 F (37.1 C)] 98.6 F (37 C) (03/17 0549) Pulse Rate:  [101-116] 101 (03/17 0549) Resp:  [18-19] 18 (03/17 0549) BP: (114-130)/(50-74) 130/74 (03/17 0549) SpO2:  [98 %-99 %] 99 % (03/17 0549)    Intake/Output from previous day: 03/16 0800 - 03/17 0759 In: 3492 [I.V.:3182] Out: 3050 [Urine:3050]    Intake/Output this shift: Total I/O In: 240 [P.O.:240] Out: 600 [Urine:600]   PHYSICAL EXAM: BP 130/74 (BP Location: Right Arm)   Pulse (!) 101   Temp 98.6 F (37 C) (Oral)   Resp 18   Ht 6' (1.829 m)   Wt 170 lb (77.1 kg)   SpO2 99%   BMI 23.06 kg/m  General appearance: alert and cooperative Lungs: clear to auscultation bilaterally Heart: regular rate and rhythm Abdomen: soft, non-tender; bowel sounds normal; no masses,  no organomegaly Extremities: extremities normal, atraumatic, no cyanosis or edema Lymph nodes: Cervical, supraclavicular, and axillary nodes normal. Neurologic: Grossly normal   Studies/Results: Results for orders placed or performed during the hospital encounter of 06/12/18 (from the past 48 hour(s))  CBC with Differential/Platelet     Status: Abnormal   Collection Time: 06/14/18  5:48 AM  Result Value Ref Range   WBC 10.7 (H) 4.0 - 10.5 K/uL   RBC 2.02 (L) 4.22 - 5.81 MIL/uL   Hemoglobin 7.0 (L) 13.0 - 17.0 g/dL   HCT 22.5 (L) 39.0 - 52.0 %   MCV 111.4 (H) 80.0 - 100.0  fL   MCH 34.7 (H) 26.0 - 34.0 pg   MCHC 31.1 30.0 - 36.0 g/dL   RDW 15.5 11.5 - 15.5 %   Platelets 165 150 - 400 K/uL   nRBC 2.4 (H) 0.0 - 0.2 %   Neutrophils Relative % 39 %   Neutro Abs 4.2 1.7 - 7.7 K/uL   Lymphocytes Relative 39 %   Lymphs Abs 4.1 (H) 0.7 - 4.0 K/uL   Monocytes Relative 18 %   Monocytes Absolute 2.0 (H) 0.1 - 1.0 K/uL   Eosinophils Relative 1 %   Eosinophils Absolute 0.1 0.0 - 0.5 K/uL   Basophils Relative 1 %   Basophils Absolute 0.1 0.0 - 0.1 K/uL   Immature Granulocytes 2 %   Abs Immature Granulocytes 0.25 (H) 0.00 - 0.07 K/uL    Comment: Performed at [facility], [address] [place], [place] [address]  Basic metabolic panel     Status: Abnormal   Collection Time: 06/14/18  5:48 AM  Result Value Ref Range   Sodium 135 135 - 145 mmol/L   Potassium 4.9 3.5 - 5.1 mmol/L   Chloride 108 98 - 111 mmol/L   CO2 16 (L) 22 - 32 mmol/L   Glucose, Bld 82 70 - 99 mg/dL   BUN 71 (H) 6 - 20 mg/dL   Creatinine, Ser 5.14 (H) 0.61 - 1.24 mg/dL  Calcium 12.2 (H) 8.9 - 10.3 mg/dL   GFR calc non Af Amer 11 (L) >60 mL/min   GFR calc Af Amer 13 (L) >60 mL/min   Anion gap 11 5 - 15    Comment: Performed at [facility], [address], [place], [place] [address]  Protein / creatinine ratio, urine     Status: Abnormal   Collection Time: 06/14/18  9:25 AM  Result Value Ref Range   Creatinine, Urine 58.21 mg/dL   Total Protein, Urine 308 mg/dL    Comment: RESULTS CONFIRMED BY MANUAL DILUTION   Protein Creatinine Ratio 5.29 (H) 0.00 - 0.15 mg/mg[Cre]    Comment: Performed at [facility], [address] [place], [place] [address]  Urine rapid drug screen (hosp performed)     Status: None   Collection Time: 06/14/18  9:25 AM  Result Value Ref Range   Opiates NONE DETECTED NONE DETECTED   Cocaine NONE DETECTED NONE DETECTED   Benzodiazepines NONE DETECTED NONE DETECTED   Amphetamines NONE DETECTED NONE DETECTED   Tetrahydrocannabinol NONE DETECTED NONE DETECTED    Barbiturates NONE DETECTED NONE DETECTED    Comment: (NOTE) DRUG SCREEN FOR MEDICAL PURPOSES ONLY.  IF CONFIRMATION IS NEEDED FOR ANY PURPOSE, NOTIFY LAB WITHIN 5 DAYS. LOWEST DETECTABLE LIMITS FOR URINE DRUG SCREEN Drug Class                     Cutoff (ng/mL) Amphetamine and metabolites    1000 Barbiturate and metabolites    200 Benzodiazepine                 062 Tricyclics and metabolites     300 Opiates and metabolites        300 Cocaine and metabolites        300 THC                            50 Performed at [facility], [address], [place], [place] [address]   Parathyroid hormone, intact (no Ca)     Status: Abnormal   Collection Time: 06/14/18  9:49 AM  Result Value Ref Range   PTH 9 (L) 15 - 65 pg/mL    Comment: (NOTE) Performed At: [facility] [place], [place] [id number] Rush Farmer MD VP:[id number]   Procalcitonin - Baseline     Status: None   Collection Time: 06/14/18  9:49 AM  Result Value Ref Range   Procalcitonin 7.71 ng/mL    Comment:        Interpretation: PCT > 2 ng/mL: Systemic infection (sepsis) is likely, unless other causes are known. (NOTE)       Sepsis PCT Algorithm           Lower Respiratory Tract                                      Infection PCT Algorithm    ----------------------------     ----------------------------         PCT < 0.25 ng/mL                PCT < 0.10 ng/mL         Strongly encourage             Strongly discourage   discontinuation of antibiotics    initiation of antibiotics    ----------------------------     -----------------------------  PCT 0.25 - 0.50 ng/mL            PCT 0.10 - 0.25 ng/mL               OR       >80% decrease in PCT            Discourage initiation of                                            antibiotics      Encourage discontinuation           of antibiotics    ----------------------------     -----------------------------         PCT >= 0.50 ng/mL               PCT 0.26 - 0.50 ng/mL               AND       <80% decrease in PCT              Encourage initiation of                                             antibiotics       Encourage continuation           of antibiotics    ----------------------------     -----------------------------        PCT >= 0.50 ng/mL                  PCT > 0.50 ng/mL               AND         increase in PCT                  Strongly encourage                                      initiation of antibiotics    Strongly encourage escalation           of antibiotics                                     -----------------------------                                           PCT <= 0.25 ng/mL                                                 OR                                        > 80% decrease in PCT  Discontinue / Do not initiate                                             antibiotics Performed at [facility], [address] [place], [place] [address]   Prepare RBC     Status: None   Collection Time: 06/14/18  9:49 AM  Result Value Ref Range   Order Confirmation      ORDER PROCESSED BY BLOOD BANK Performed at [facility], [address] [place], [place] [address]   CK     Status: None   Collection Time: 06/14/18  9:49 AM  Result Value Ref Range   Total CK 50 49 - 397 U/L    Comment: Performed at [facility], [address], [place], [place] [address]  Type and screen [facility]     Status: None   Collection Time: 06/14/18  9:49 AM  Result Value Ref Range   ABO/RH(D) A POS    Antibody Screen NEG    Sample Expiration 06/17/2018    Unit Number [id number]    Blood Component Type RED CELLS,LR    Unit division 00    Status of Unit ISSUED,FINAL    Transfusion Status OK TO TRANSFUSE    Crossmatch Result Compatible    Unit Number [id number]    Blood Component Type RED CELLS,LR    Unit division 00    Status of Unit ISSUED,FINAL    Transfusion Status OK TO  TRANSFUSE    Crossmatch Result      Compatible Performed at [facility], [address] [place], [place] [address]   ABO/Rh     Status: None   Collection Time: 06/14/18  9:49 AM  Result Value Ref Range   ABO/RH(D)      A POS Performed at [facility], [address] [place], [place] [address]   Strep pneumoniae urinary antigen     Status: None   Collection Time: 06/14/18 10:00 AM  Result Value Ref Range   Strep Pneumo Urinary Antigen NEGATIVE NEGATIVE    Comment:        Infection due to S. pneumoniae cannot be absolutely ruled out since the antigen present may be below the detection limit of the test. Performed at [facility], [address], [place], [place] [address]   Immunofixation, urine     Status: None   Collection Time: 06/14/18 10:03 AM  Result Value Ref Range   Immunofixation, Urine Comment     Comment: (NOTE) Bence Jones Protein positive; kappa type. Performed At: [facility] [place], [place] [id number] Rush Farmer MD FB:[id number]   Iron and TIBC     Status: None   Collection Time: 06/14/18 10:11 AM  Result Value Ref Range   Iron 69 45 - 182 ug/dL   TIBC 343 250 - 450 ug/dL   Saturation Ratios 20 17.9 - 39.5 %   UIBC 274 ug/dL    Comment: Performed at [facility], [address] [place],  [address]  Renal function panel     Status: Abnormal   Collection Time: 06/15/18  5:34 AM  Result Value Ref Range   Sodium 134 (L) 135 - 145 mmol/L   Potassium 4.6 3.5 - 5.1 mmol/L   Chloride 101 98 - 111 mmol/L   CO2 18 (L) 22 - 32 mmol/L  Glucose, Bld 80 70 - 99 mg/dL   BUN 63 (H) 6 - 20 mg/dL   Creatinine, Ser 4.92 (H) 0.61 - 1.24 mg/dL   Calcium 12.9 (H) 8.9 - 10.3 mg/dL   Phosphorus 6.0 (H) 2.5 - 4.6 mg/dL   Albumin 3.8 3.5 - 5.0 g/dL   GFR calc non Af Amer 12 (L) >60 mL/min   GFR calc Af Amer 14 (L) >60 mL/min   Anion gap 15 5 - 15    Comment: Performed at [facility], [address], [place], [place] [address]   Hepatic function panel     Status: None   Collection Time: 06/15/18  5:34 AM  Result Value Ref Range   Total Protein 7.1 6.5 - 8.1 g/dL   Albumin 3.8 3.5 - 5.0 g/dL   AST 22 15 - 41 U/L   ALT 19 0 - 44 U/L   Alkaline Phosphatase 82 38 - 126 U/L   Total Bilirubin 0.3 0.3 - 1.2 mg/dL   Bilirubin, Direct <0.1 0.0 - 0.2 mg/dL   Indirect Bilirubin NOT CALCULATED 0.3 - 0.9 mg/dL    Comment: Performed at [facility], [address] [place], [place] [address]  CBC with Differential/Platelet     Status: Abnormal   Collection Time: 06/15/18  5:34 AM  Result Value Ref Range   WBC 12.0 (H) 4.0 - 10.5 K/uL   RBC 2.96 (L) 4.22 - 5.81 MIL/uL   Hemoglobin 9.8 (L) 13.0 - 17.0 g/dL    Comment: REPEATED TO VERIFY POST TRANSFUSION SPECIMEN    HCT 29.7 (L) 39.0 - 52.0 %   MCV 100.3 (H) 80.0 - 100.0 fL    Comment: DELTA CHECK NOTED   MCH 33.1 26.0 - 34.0 pg   MCHC 33.0 30.0 - 36.0 g/dL   RDW 20.1 (H) 11.5 - 15.5 %   Platelets 150 150 - 400 K/uL   nRBC 2.5 (H) 0.0 - 0.2 %   Neutrophils Relative % 33 %   Neutro Abs 3.9 1.7 - 7.7 K/uL   Lymphocytes Relative 40 %   Lymphs Abs 4.9 (H) 0.7 - 4.0 K/uL   Monocytes Relative 22 %   Monocytes Absolute 2.7 (H) 0.1 - 1.0 K/uL   Eosinophils Relative 1 %   Eosinophils Absolute 0.1 0.0 - 0.5 K/uL   Basophils Relative 1 %   Basophils Absolute 0.1 0.0 - 0.1 K/uL   Immature Granulocytes 3 %   Abs Immature Granulocytes 0.31 (H) 0.00 - 0.07 K/uL   Abnormal Lymphocytes Present PRESENT     Comment: Performed at [facility], [address], [place], [place] [address]  Protime-INR     Status: None   Collection Time: 06/15/18 11:53 AM  Result Value Ref Range   Prothrombin Time 12.7 11.4 - 15.2 seconds   INR 1.0 0.8 - 1.2    Comment: (NOTE) INR goal varies based on device and disease states. Performed at [facility], [address] [place], [place] [address]    Ct Chest Wo Contrast  Result Date: 06/14/2018 CLINICAL DATA:  58 year old male with history of  chest pain and pleural effusion. EXAM: CT CHEST WITHOUT CONTRAST TECHNIQUE: Multidetector CT imaging of the chest was performed following the standard protocol without IV contrast. COMPARISON:  Chest CT 05/20/2016.  Chest x-ray 06/12/2018. FINDINGS: Cardiovascular: Heart size is normal. There is no significant pericardial fluid, thickening or pericardial calcification. Aortic atherosclerosis. No coronary artery calcifications. Mediastinum/Nodes: No pathologically enlarged mediastinal or hilar lymph nodes. Esophagus is unremarkable  in appearance. No axillary lymphadenopathy. Lungs/Pleura: Multiple tiny 2-3 mm pulmonary nodules scattered throughout both lungs, several of which are calcified, compatible with benign granulomas. No larger more suspicious appearing pulmonary nodules or masses are noted. Moderate-sized bilateral pleural effusions lying dependently. This is associated with some mild passive atelectasis in the lower lobes of the lungs bilaterally. No acute consolidative airspace disease. Diffuse bronchial wall thickening with mild centrilobular and paraseptal emphysema. Upper Abdomen: Aortic atherosclerosis. Musculoskeletal: There are no aggressive appearing lytic or blastic lesions noted in the visualized portions of the skeleton. IMPRESSION: 1. Moderate bilateral pleural effusions lying dependently with areas of passive subsegmental atelectasis in the lower lobes of the lungs bilaterally. 2. Multiple tiny 2-3 mm pulmonary nodules scattered throughout the lungs bilaterally, nonspecific, but similar to prior study from 05/28/2016, considered definitively benign. 3. Aortic atherosclerosis. 4. Diffuse bronchial wall thickening with mild centrilobular and paraseptal emphysema; imaging findings suggestive of underlying COPD. Aortic Atherosclerosis (ICD10-I70.0) and Emphysema (ICD10-J43.9). Electronically Signed   By: Vinnie Langton M.D.   On: 06/14/2018 10:53   Mr Thoracic Spine Wo Contrast  Result Date:  06/14/2018 CLINICAL DATA:  Mid low back pain for 2 months EXAM: MRI THORACIC AND LUMBAR SPINE WITHOUT CONTRAST TECHNIQUE: Multiplanar and multiecho pulse sequences of the thoracic and lumbar spine were obtained without intravenous contrast. COMPARISON:  Lumbar spine MRI 05/06/2018 FINDINGS: Severe patient motion degrading image quality limiting evaluation. Portion of the examination are nondiagnostic. MRI THORACIC SPINE FINDINGS Alignment:  Physiologic. Vertebrae: No fracture, evidence of discitis, or bone lesion. Cord:  Normal signal and morphology. Paraspinal and other soft tissues: No acute paraspinal abnormality. Disc levels: Disc spaces:  Disc spaces are relatively preserved. C6-7: Mild broad-based disc bulge. No foraminal or central canal stenosis. T1-T2: No disc protrusion, foraminal stenosis or central canal stenosis. T2-T3: No disc protrusion, foraminal stenosis or central canal stenosis. T3-T4: Broad-based disc bulge. Moderate bilateral facet arthropathy. Mild spinal stenosis. T4-T5: No disc protrusion, foraminal stenosis or central canal stenosis. Bilateral mild facet arthropathy. T5-T6: No disc protrusion, foraminal stenosis or central canal stenosis. T6-T7: No disc protrusion, foraminal stenosis or central canal stenosis. T7-T8: No disc protrusion, foraminal stenosis or central canal stenosis. T8-T9: No disc protrusion, foraminal stenosis or central canal stenosis. T9-T10: No disc protrusion, foraminal stenosis or central canal stenosis. T10-T11: No disc protrusion, foraminal stenosis or central canal stenosis. T11-T12: No disc protrusion, foraminal stenosis or central canal stenosis. MRI LUMBAR SPINE FINDINGS Segmentation:  Standard. Alignment:  Physiologic. Vertebrae: No fracture, evidence of discitis, or aggressive bone lesion. Low marrow signal throughout the lumbar spine which is limited in evaluation secondary to patient motion. Conus medullaris and cauda equina: Conus extends to the T12 level.  Conus and cauda equina appear normal. Paraspinal and other soft tissues: No acute paraspinal abnormality. Disc levels: Disc spaces: Posterior lumbar interbody fusion from L1 through L4. T12-L1: Minimal broad-based disc bulge. Mild bilateral facet arthropathy. No evidence of neural foraminal stenosis. No central canal stenosis. L1-L2: Interbody fusion. No evidence of neural foraminal stenosis. No central canal stenosis. L2-L3: Interbody fusion. Evaluation is extremely limited secondary to patient motion degrading image quality. There is suggestion of persistent right central/right paracentral epidural soft tissue, but characterization is extremely difficult for given the degree of patient motion. Right foraminal stenosis. No central canal stenosis. L3-L4: Interbody fusion. No evidence of neural foraminal stenosis. No central canal stenosis. L4-L5: Minimal broad-based disc bulge. Mild bilateral facet scratch them moderate bilateral facet arthropathy. Mild left foraminal stenosis. No right foraminal  stenosis. No central canal stenosis. L5-S1: Broad-based disc bulge. Moderate bilateral facet arthropathy. Mild left foraminal stenosis. No significant right foraminal stenosis. No central canal stenosis. IMPRESSION: Severe patient motion degrading image quality limiting evaluation. Portion of the examination are nondiagnostic. MR THORACIC SPINE IMPRESSION 1. No aggressive osseous lesion to suggest malignancy. 2.  No acute osseous injury of the thoracic spine. MR LUMBAR SPINE IMPRESSION 1. L2-3 is extremely limited in evaluation secondary to patient motion degrading image quality. There is suggestion of persistent right central/right paracentral epidural soft tissue, but characterization is extremely difficult for given the degree of patient motion. Right foraminal stenosis. Recommend repeat MRI of the lumbar spine when the patient is able to better tolerate the examination with less patient motion. 2.  No acute osseous  injury of the lumbar spine. Electronically Signed   By: Kathreen Devoid   On: 06/14/2018 19:51   Mr Lumbar Spine Wo Contrast  Result Date: 06/14/2018 CLINICAL DATA:  Mid low back pain for 2 months EXAM: MRI THORACIC AND LUMBAR SPINE WITHOUT CONTRAST TECHNIQUE: Multiplanar and multiecho pulse sequences of the thoracic and lumbar spine were obtained without intravenous contrast. COMPARISON:  Lumbar spine MRI 05/06/2018 FINDINGS: Severe patient motion degrading image quality limiting evaluation. Portion of the examination are nondiagnostic. MRI THORACIC SPINE FINDINGS Alignment:  Physiologic. Vertebrae: No fracture, evidence of discitis, or bone lesion. Cord:  Normal signal and morphology. Paraspinal and other soft tissues: No acute paraspinal abnormality. Disc levels: Disc spaces:  Disc spaces are relatively preserved. C6-7: Mild broad-based disc bulge. No foraminal or central canal stenosis. T1-T2: No disc protrusion, foraminal stenosis or central canal stenosis. T2-T3: No disc protrusion, foraminal stenosis or central canal stenosis. T3-T4: Broad-based disc bulge. Moderate bilateral facet arthropathy. Mild spinal stenosis. T4-T5: No disc protrusion, foraminal stenosis or central canal stenosis. Bilateral mild facet arthropathy. T5-T6: No disc protrusion, foraminal stenosis or central canal stenosis. T6-T7: No disc protrusion, foraminal stenosis or central canal stenosis. T7-T8: No disc protrusion, foraminal stenosis or central canal stenosis. T8-T9: No disc protrusion, foraminal stenosis or central canal stenosis. T9-T10: No disc protrusion, foraminal stenosis or central canal stenosis. T10-T11: No disc protrusion, foraminal stenosis or central canal stenosis. T11-T12: No disc protrusion, foraminal stenosis or central canal stenosis. MRI LUMBAR SPINE FINDINGS Segmentation:  Standard. Alignment:  Physiologic. Vertebrae: No fracture, evidence of discitis, or aggressive bone lesion. Low marrow signal throughout the  lumbar spine which is limited in evaluation secondary to patient motion. Conus medullaris and cauda equina: Conus extends to the T12 level. Conus and cauda equina appear normal. Paraspinal and other soft tissues: No acute paraspinal abnormality. Disc levels: Disc spaces: Posterior lumbar interbody fusion from L1 through L4. T12-L1: Minimal broad-based disc bulge. Mild bilateral facet arthropathy. No evidence of neural foraminal stenosis. No central canal stenosis. L1-L2: Interbody fusion. No evidence of neural foraminal stenosis. No central canal stenosis. L2-L3: Interbody fusion. Evaluation is extremely limited secondary to patient motion degrading image quality. There is suggestion of persistent right central/right paracentral epidural soft tissue, but characterization is extremely difficult for given the degree of patient motion. Right foraminal stenosis. No central canal stenosis. L3-L4: Interbody fusion. No evidence of neural foraminal stenosis. No central canal stenosis. L4-L5: Minimal broad-based disc bulge. Mild bilateral facet scratch them moderate bilateral facet arthropathy. Mild left foraminal stenosis. No right foraminal stenosis. No central canal stenosis. L5-S1: Broad-based disc bulge. Moderate bilateral facet arthropathy. Mild left foraminal stenosis. No significant right foraminal stenosis. No central canal stenosis. IMPRESSION: Severe patient  motion degrading image quality limiting evaluation. Portion of the examination are nondiagnostic. MR THORACIC SPINE IMPRESSION 1. No aggressive osseous lesion to suggest malignancy. 2.  No acute osseous injury of the thoracic spine. MR LUMBAR SPINE IMPRESSION 1. L2-3 is extremely limited in evaluation secondary to patient motion degrading image quality. There is suggestion of persistent right central/right paracentral epidural soft tissue, but characterization is extremely difficult for given the degree of patient motion. Right foraminal stenosis. Recommend  repeat MRI of the lumbar spine when the patient is able to better tolerate the examination with less patient motion. 2.  No acute osseous injury of the lumbar spine. Electronically Signed   By: Kathreen Devoid   On: 06/14/2018 19:51   US Renal  Result Date: 06/14/2018 CLINICAL DATA:  Acute on chronic renal failure EXAM: RENAL / URINARY TRACT ULTRASOUND COMPLETE COMPARISON:  06/12/2018 FINDINGS: Right Kidney: Renal measurements: 11.6 x 4.9 x 7.6 cm = volume: 224 mL. Diffuse increased echogenicity is noted. No mass or hydronephrosis is noted. Left Kidney: Renal measurements: 11.9 x 6.5 x 5.5 cm = volume: 222 mL. Increased echogenicity is noted. No mass lesion or hydronephrosis is seen. Bladder: Appears normal for degree of bladder distention. Bilateral pleural effusions are noted similar to that seen on prior CT. IMPRESSION: Diffuse increased echogenicity consistent with the given clinical history. Bilateral small effusions. Electronically Signed   By: Inez Catalina M.D.   On: 06/14/2018 10:30   Dg Bone Survey Met  Result Date: 06/15/2018 CLINICAL DATA:  58 year old male with a history of hypercalcemia EXAM: METASTATIC BONE SURVEY COMPARISON:  Chest CT 06/14/2018, plain film 06/12/2018, lumbar CT 05/06/2018 FINDINGS: Skull: No acute fracture. No lytic or sclerotic lesion identified.  Endodontal disease. Cervical spine: Surgical changes of anterior cervical discectomy infusion with anterior plate screw fixation Y0-D9 with no complicating features. Degenerative changes at the levels above and below. No lytic lesions or sclerotic lesions.  Alignment maintained. Thoracic spine: Vertebral body heights maintained with alignment. Mild disc disease of the thoracic spine. No acute fracture. No lytic or sclerotic lesions. Unremarkable appearance of the pedicles. Lumbar spine: Surgical changes of posterior lumbar interbody fusion with bilateral pedicle screw and rod fixation spanning L1-L4. No complicating features. No acute  fracture. The lytic changes that were identified on prior CT are not visualized on the current plain film. Moderate disc disease and facet disease of the lumbar spine. No lytic or sclerotic lesions identified. Chest: Cardiomediastinal silhouette within normal limits. Blunting of the bilateral costophrenic angles with obscuration of the hemidiaphragms bilaterally. No interlobular septal thickening. No rib fracture identified. Pelvis: No acute fracture. Sclerotic changes at the inferior aspect of the left sacroiliac joint on the iliac aspect, similar to prior CT abdomen. No lytic changes. Degenerative changes of the hips. Right upper extremity: No acute fracture.  No lucent lesion or sclerotic focus. Left upper extremity: No acute fracture.  No lucent lesion or sclerotic changes. Right lower extremity: No acute fracture.  No no lucent lesion or sclerotic lesion. Left lower extremity: No acute fracture.  No lucent lesion or sclerotic lesion. IMPRESSION: Abnormal lucent changes identified in the lumbar spine on the prior CT dated 05/06/2018 are not visualized on the current plain film. If concern for malignancy, correlation with either nuclear medicine bone scan or PET-CT may be useful. Sclerotic focus at the inferior aspect of the left sacral iliac joint, present on prior CT pelvis. If concern for malignancy, correlation with either nuclear medicine bone scan or PET-CT may be  useful. Bilateral small pleural effusions, present on prior CT. Electronically Signed   By: Corrie Mckusick D.O.   On: 06/15/2018 09:22     MEDICATIONS: I have reviewed the patient's current medications.     Assessment/Plan:  1.  Hypercalcemia: - Calcium level today has gone up to 12.9. -He was started on calcitonin subcu twice daily. - If he does not improve consider denosumab 120 mg subcu x1. - MRI of the thoracic spine did not show any osseous lesions.  MRI of the lumbar spine showed extremely limited evaluation secondary to motion.   There is suggestion of persistent right central/right paracentral epidural soft tissue but characterization was extremely difficult. - Bone survey on 06/15/2018 showed no lytic lesions.  Sclerotic focus at the inferior aspect of the left sacroiliac joint present. -SPEP showed 0.4 g of monoclonal protein. -Urine immunofixation was consistent with Bence-Jones protein, kappa type. -Serum immunofixation and free light chain assay are pending at this time. - He is scheduled for a bone marrow biopsy by IR tomorrow.  We will follow-up on the bone marrow biopsy results.  2.  Macrocytic anemia: -Status post blood transfusion. -He is on folic acid supplementation.  All questions were answered. The patient knows to call the clinic with any problems, questions or concerns. We can certainly see the patient much sooner if necessary.     Derek Jack

## 2018-06-15 NOTE — Progress Notes (Addendum)
Attending RN and secretary made arrangements for [facility] to transport patient to [facility] Wednesday, March 18th at 8 am.

## 2018-06-16 ENCOUNTER — Ambulatory Visit (HOSPITAL_COMMUNITY)
Admit: 2018-06-16 | Discharge: 2018-06-16 | Disposition: A | Discharge status: Home / Self Care | Payer: Medicare Other | Attending: Internal Medicine | Admitting: Internal Medicine

## 2018-06-16 ENCOUNTER — Encounter (HOSPITAL_COMMUNITY): Payer: Self-pay

## 2018-06-16 DIAGNOSIS — G8929 Other chronic pain: Secondary | ICD-10-CM

## 2018-06-16 DIAGNOSIS — C9000 Multiple myeloma not having achieved remission: Secondary | ICD-10-CM | POA: Diagnosis present

## 2018-06-16 DIAGNOSIS — F1910 Other psychoactive substance abuse, uncomplicated: Secondary | ICD-10-CM

## 2018-06-16 DIAGNOSIS — G9589 Other specified diseases of spinal cord: Secondary | ICD-10-CM | POA: Diagnosis present

## 2018-06-16 DIAGNOSIS — M5442 Lumbago with sciatica, left side: Secondary | ICD-10-CM

## 2018-06-16 LAB — CBC WITH DIFFERENTIAL/PLATELET
Abs Immature Granulocytes: 0.31 10*3/uL — ABNORMAL HIGH (ref 0.00–0.07)
Basophils Absolute: 0.1 10*3/uL (ref 0.0–0.1)
Basophils Relative: 1 %
Eosinophils Absolute: 0.1 10*3/uL (ref 0.0–0.5)
Eosinophils Relative: 1 %
HCT: 28.7 % — ABNORMAL LOW (ref 39.0–52.0)
Hemoglobin: 9.4 g/dL — ABNORMAL LOW (ref 13.0–17.0)
Immature Granulocytes: 3 %
Lymphocytes Relative: 39 %
Lymphs Abs: 4.4 10*3/uL — ABNORMAL HIGH (ref 0.7–4.0)
MCH: 33.1 pg (ref 26.0–34.0)
MCHC: 32.8 g/dL (ref 30.0–36.0)
MCV: 101.1 fL — ABNORMAL HIGH (ref 80.0–100.0)
Monocytes Absolute: 1.8 10*3/uL — ABNORMAL HIGH (ref 0.1–1.0)
Monocytes Relative: 17 %
Neutro Abs: 4.3 10*3/uL (ref 1.7–7.7)
Neutrophils Relative %: 39 %
Platelets: 158 10*3/uL (ref 150–400)
RBC: 2.84 MIL/uL — ABNORMAL LOW (ref 4.22–5.81)
RDW: 19.6 % — ABNORMAL HIGH (ref 11.5–15.5)
WBC: 11 10*3/uL — ABNORMAL HIGH (ref 4.0–10.5)
nRBC: 2.3 % — ABNORMAL HIGH (ref 0.0–0.2)

## 2018-06-16 LAB — IMMUNOFIXATION ELECTROPHORESIS
IgA: 14 mg/dL — ABNORMAL LOW (ref 90–386)
IgG (Immunoglobin G), Serum: 441 mg/dL — ABNORMAL LOW (ref 700–1600)
IgM (Immunoglobulin M), Srm: 5 mg/dL — ABNORMAL LOW (ref 20–172)
Total Protein ELP: 6.4 g/dL (ref 6.0–8.5)

## 2018-06-16 LAB — RENAL FUNCTION PANEL
Albumin: 3.7 g/dL (ref 3.5–5.0)
Anion gap: 14 (ref 5–15)
BUN: 57 mg/dL — ABNORMAL HIGH (ref 6–20)
CO2: 21 mmol/L — ABNORMAL LOW (ref 22–32)
Calcium: 12 mg/dL — ABNORMAL HIGH (ref 8.9–10.3)
Chloride: 99 mmol/L (ref 98–111)
Creatinine, Ser: 4.5 mg/dL — ABNORMAL HIGH (ref 0.61–1.24)
GFR calc Af Amer: 15 mL/min — ABNORMAL LOW (ref >60)
GFR calc non Af Amer: 13 mL/min — ABNORMAL LOW (ref >60)
Glucose, Bld: 98 mg/dL (ref 70–99)
Phosphorus: 6.2 mg/dL — ABNORMAL HIGH (ref 2.5–4.6)
Potassium: 4.3 mmol/L (ref 3.5–5.1)
Sodium: 134 mmol/L — ABNORMAL LOW (ref 135–145)

## 2018-06-16 LAB — KAPPA/LAMBDA LIGHT CHAINS
Kappa free light chain: 12085.4 mg/L — ABNORMAL HIGH (ref 3.3–19.4)
Kappa, lambda light chain ratio: 1981.21 — ABNORMAL HIGH (ref 0.26–1.65)
Lambda free light chains: 6.1 mg/L (ref 5.7–26.3)

## 2018-06-16 MED ORDER — FLUMAZENIL 0.5 MG/5ML IV SOLN
INTRAVENOUS | Status: AC
  Filled 2018-06-16: qty 5

## 2018-06-16 MED ORDER — MIDAZOLAM HCL 2 MG/2ML IJ SOLN
INTRAMUSCULAR | Status: AC
  Filled 2018-06-16: qty 4

## 2018-06-16 MED ORDER — NALOXONE HCL 0.4 MG/ML IJ SOLN
INTRAMUSCULAR | Status: AC
  Filled 2018-06-16: qty 1

## 2018-06-16 MED ORDER — DENOSUMAB 120 MG/1.7ML ~~LOC~~ SOLN
120.0000 mg | Freq: Once | SUBCUTANEOUS | Status: DC
  Filled 2018-06-16: qty 1.7

## 2018-06-16 MED ORDER — HYDROCODONE-ACETAMINOPHEN 5-325 MG PO TABS
2.0000 | ORAL_TABLET | ORAL | Status: DC | PRN
  Administered 2018-06-16 – 2018-06-17 (×3): 2 via ORAL
  Filled 2018-06-16 (×3): qty 2

## 2018-06-16 MED ORDER — FENTANYL CITRATE (PF) 100 MCG/2ML IJ SOLN
INTRAMUSCULAR | Status: AC | PRN
  Administered 2018-06-16 (×2): 50 ug via INTRAVENOUS

## 2018-06-16 MED ORDER — MIDAZOLAM HCL 2 MG/2ML IJ SOLN
INTRAMUSCULAR | Status: AC | PRN
  Administered 2018-06-16 (×3): 1 mg via INTRAVENOUS

## 2018-06-16 MED ORDER — FENTANYL CITRATE (PF) 100 MCG/2ML IJ SOLN
INTRAMUSCULAR | Status: AC
  Filled 2018-06-16: qty 4

## 2018-06-16 MED ORDER — LIDOCAINE HCL (PF) 1 % IJ SOLN
INTRAMUSCULAR | Status: AC | PRN
  Administered 2018-06-16: 10 mL

## 2018-06-16 MED ORDER — SODIUM BICARBONATE 650 MG PO TABS
650.0000 mg | ORAL_TABLET | Freq: Two times a day (BID) | ORAL | Status: DC
  Administered 2018-06-16 – 2018-06-21 (×10): 650 mg via ORAL
  Filled 2018-06-16 (×10): qty 1

## 2018-06-16 NOTE — Progress Notes (Addendum)
PROGRESS NOTE  [name] JHE:[id number] DOB: 05-11-1960 DOA: 06/12/2018 PCP: Patient, No Pcp Per  Brief History: 57 year old male with a history of COPD, testicular cancer, substance abuse, spinal stenosis and lumbar radiculopathy status post fusion 07/24/2015([place])presenting with abdominal pain radiating to the bilateral flanks as well as nausea without any emesis or diarrhea. He denies any fevers, chills, chest pain, shortness of breath, coughing. Unfortunately, patient is a difficult historian and tends to be tangential. Nevertheless, he has also been complaining of worsening lower back pain and leg weakness. He has not fallen or had any syncope. He has been taking over-the-counter NSAIDs as well as tramadol. Patient had MRI of the lumbar spine on 05/06/2018 which showed abnormal right prevertebral and epidural soft tissue at L2-3 concerning for tumor infection. There is also right L3 nerve impingement. There is moderate L4-5 canal stenosis with moderate to severe L2-3 narrowing. The patient subsequently had L2 nerve block performed on 05/27/2018. Upon presentation, the patient was noted to have hypercalcemia with a corrected calcium of 13.1. The patient was started on IV fluidswith some improvement of his serum calcium.  Subsequently, the patient was started on calcitonin.  Renal and heme/onc were consulted to assist with management.  Assessment/Plan: AKI -Secondary to NSAIDs as well as hypercalcemia -Renal ultrasound--neg hydronephrosis -SPEP--positive M-spike in gamma region -Serum Immunofixation--pending -serum kappa/lambda light chains--pending -urine immunofixation = kappa Bence Jones protein -baseline creatinine ~1.0 -urine protein/creatinine ratio--5.29 -appreciate nephrology follow up  Hypercalcemia -Intact PTH--9 -likely due to underlying malignancy--data pointing to myeloma -Continue IV fluids as serum calcium is improving -SPEP--positive M-spike  in gamma region -Immunofixation--pending -serum kappa/lambda light chains--pending -urine immunofixation = kappa Bence-Jones protein -heme/onc consult-->Katragadda spoke with IR after I spoke with Dr. Vernard Gambles (IR)-->only wants BM biopsy, cancelled L2-3 biopsy -finish 4 doses calcitonin [place]  Lumbar radiculopathy -05/06/2018 MRI L-spine--diffusely abnormal bone marrow signal concerning for myeloproliferative disorder versus infiltrative metastatic disease. Abnormal prevertebral tissue as discussed above at L2-3 -3/16&3/17--discussed with neurosurgery, Dr. Melynda Keller nonoperative management for radiculopathy -06/15/18--discussed with IR (Dr. Gaye Pollack for needle bx of L2-3 soft tissue-->placed order in Epic -Dr. Delton Coombes spoke with IR after I spoke with Dr. Vernard Gambles (IR)-->only wants BM biopsy, cancelled L2-3 biopsy -3/16 MR T-spine--no acute findings 3/16 MR L-spine--motion degraded with persistent paracentral soft tissue -06/15/18--personally ambulated pt--pt able to ambulate with minimal to no assist  Bilateral pleural effusions -Echocardiogram--probably normal EF, poor quality -Urine protein creatinine ratio--5.29 -Stable on room air  Proteinuria -pt has nephrotic range proteinuria -has contributed to pleural effusions -urine protein/creatinine ratio 5.29 -nephrology following--appreciated  COPD -Stable on room air -Patient states that he quit smoking 1 month ago  Pulmonary opacity -CT abdomen and pelvis showed consolidation in the posterior left lower lobe -Patient has no fever or shortness of breath -Viral respiratory panel shows rhinovirus -Check procalcitonin--7.71 -CT chest--no consolidation, moderate bilateral pleural effusion with atelectasis.  Multiple tiny bilateral nodules. -d/c ceftriaxone and azithromycin-->remains afebrile and hemodynamically stable  Abdominal Pain -improving after BM -3/14 CT abd--mid sigmoid wall thickening without  surrounding inflammation--?muscular hypertrophy  Macrocytic anemia -[id number] -folic SHFW--2.6 -iron saturation 35%, ferritin 62 -transfused 2 units 06/14/18 -started folate supplementation    Disposition Plan:Not stable for d/c Family Communication:daughter updated at bedside 3/17  Consultants:Renal/heme/onc and IR  Code Status: FULL   DVT Prophylaxis: [place] Heparin    Procedures: As Listed in Progress Note Above  Antibiotics: Ceftriaxone 3/14>>>3/16 azithrommycin 3/14>>>3/16      Subjective: Pt states  that back pain is slightly better.  He denies cp, sob, n/v/d, abd pain.  No f/c, cough, hemoptysis.  Objective: Vitals:   06/15/18 0549 06/15/18 1617 06/15/18 2130 06/16/18 0505  BP: 130/74 (!) 110/56 (!) 120/96 122/70  Pulse: (!) 101 (!) 104 (!) 111 (!) 103  Resp: 18 17 18 16   Temp: 98.6 F (37 C) 98.5 F (36.9 C) 98 F (36.7 C) 98.3 F (36.8 C)  TempSrc: Oral Oral Oral Oral  SpO2: 99% 93% 97% 97%  Weight:      Height:        Intake/Output Summary (Last 24 hours) at 06/16/2018 0648 Last data filed at 06/16/2018 0500 Gross per 24 hour  Intake 240 ml  Output 1800 ml  Net -1560 ml   Weight change:  Exam:   General:  Pt is alert, follows commands appropriately, not in acute distress  HEENT: No icterus, No thrush, No neck mass, [place]/AT  Cardiovascular: RRR, S1/S2, no rubs, no gallops  Respiratory: bibasilar crackles, no wheeze  Abdomen: Soft/+BS, non tender, non distended, no guarding  Extremities: No edema, No lymphangitis, No petechiae, No rashes, no synovitis  Neuro:  CN II-XII intact, strength 4-/5 in RLE, strength 4/5 LUE, LLE; sensation intact bilateral; no dysmetria; babinski equivocal     Data Reviewed: I have personally reviewed following labs and imaging studies Basic Metabolic Panel: Recent Labs  Lab 06/12/18 2240 06/13/18 0639 06/14/18 0548 06/15/18 0534 06/16/18 0503  NA 131* 134* 135 134* 134*  K 4.4 4.5  4.9 4.6 4.3  CL 102 105 108 101 99  CO2 16* 16* 16* 18* 21*  GLUCOSE 99 83 82 80 98  BUN 85* 85* 71* 63* 57*  CREATININE 5.69* 5.48* 5.14* 4.92* 4.50*  CALCIUM 12.2*  12.3* 12.3* 12.2* 12.9* 12.0*  PHOS  --   --   --  6.0* 6.2*   Liver Function Tests: Recent Labs  Lab 06/12/18 1325 06/15/18 0534 06/16/18 0503  AST 20 22  --   ALT 23 19  --   ALKPHOS 99 82  --   BILITOT 0.6 0.3  --   PROT 7.8 7.1  --   ALBUMIN 4.4 3.8  3.8 3.7   Recent Labs  Lab 06/12/18 1325  LIPASE 33   No results for input(s): AMMONIA in the last 168 hours. Coagulation Profile: Recent Labs  Lab 06/15/18 1153  INR 1.0   CBC: Recent Labs  Lab 06/12/18 1325 06/12/18 2240 06/13/18 0639 06/14/18 0548 06/15/18 0534 06/16/18 0503  WBC 10.6*  --  9.6 10.7* 12.0* 11.0*  NEUTROABS  --  5.4 3.6 4.2 3.9 4.3  HGB 8.3*  --  7.3* 7.0* 9.8* 9.4*  HCT 25.6*  --  22.9* 22.5* 29.7* 28.7*  MCV 107.6*  --  111.2* 111.4* 100.3* 101.1*  PLT 164  --  150 165 150 158   Cardiac Enzymes: Recent Labs  Lab 06/14/18 0949  CKTOTAL 50   BNP: Invalid input(s): POCBNP CBG: No results for input(s): GLUCAP in the last 168 hours. HbA1C: No results for input(s): HGBA1C in the last 72 hours. Urine analysis:    Component Value Date/Time   COLORURINE YELLOW 06/12/2018 1356   APPEARANCEUR CLEAR 06/12/2018 1356   LABSPEC 1.012 06/12/2018 1356   PHURINE 5.0 06/12/2018 1356   GLUCOSEU NEGATIVE 06/12/2018 1356   HGBUR MODERATE (A) 06/12/2018 1356   BILIRUBINUR NEGATIVE 06/12/2018 1356   KETONESUR NEGATIVE 06/12/2018 1356   PROTEINUR 30 (A) 06/12/2018 1356   UROBILINOGEN 0.2  02/09/2008 2042   NITRITE NEGATIVE 06/12/2018 [place] 06/12/2018 1356   Sepsis Labs: @LABRCNTIP (procalcitonin:4,lacticidven:4) ) Recent Results (from the past 240 hour(s))  Culture, blood (routine x 2) Call MD if unable to obtain prior to antibiotics being given     Status: None (Preliminary result)   Collection Time:  06/12/18 10:41 PM  Result Value Ref Range Status   Specimen Description BLOOD RIGHT HAND  Final   Special Requests   Final    BOTTLES DRAWN AEROBIC AND ANAEROBIC Blood Culture adequate volume   Culture   Final    NO GROWTH 4 DAYS Performed at [facility], [address] [place], [place] [address]    Report Status PENDING  Incomplete  Respiratory Panel by PCR     Status: Abnormal   Collection Time: 06/12/18 10:44 PM  Result Value Ref Range Status   Adenovirus NOT DETECTED NOT DETECTED Final   Coronavirus 229E NOT DETECTED NOT DETECTED Final    Comment: (NOTE) The Coronavirus on the Respiratory Panel, DOES NOT test for the novel  Coronavirus (2019 nCoV)    Coronavirus HKU1 NOT DETECTED NOT DETECTED Final   Coronavirus NL63 NOT DETECTED NOT DETECTED Final   Coronavirus OC43 NOT DETECTED NOT DETECTED Final   Metapneumovirus NOT DETECTED NOT DETECTED Final   Rhinovirus / Enterovirus DETECTED (A) NOT DETECTED Final   Influenza A NOT DETECTED NOT DETECTED Final   Influenza B NOT DETECTED NOT DETECTED Final   Parainfluenza Virus 1 NOT DETECTED NOT DETECTED Final   Parainfluenza Virus 2 NOT DETECTED NOT DETECTED Final   Parainfluenza Virus 3 NOT DETECTED NOT DETECTED Final   Parainfluenza Virus 4 NOT DETECTED NOT DETECTED Final   Respiratory Syncytial Virus NOT DETECTED NOT DETECTED Final   Bordetella pertussis NOT DETECTED NOT DETECTED Final   Chlamydophila pneumoniae NOT DETECTED NOT DETECTED Final   Mycoplasma pneumoniae NOT DETECTED NOT DETECTED Final    Comment: Performed at [facility], [place] [address], [place], [place] [address]  Culture, blood (routine x 2) Call MD if unable to obtain prior to antibiotics being given     Status: None (Preliminary result)   Collection Time: 06/12/18 10:58 PM  Result Value Ref Range Status   Specimen Description BLOOD LEFT ARM  Final   Special Requests   Final    BOTTLES DRAWN AEROBIC AND ANAEROBIC Blood Culture adequate volume   Culture    Final    NO GROWTH 4 DAYS Performed at [facility], [address] [place], [place] [address]    Report Status PENDING  Incomplete  MRSA PCR Screening     Status: None   Collection Time: 06/13/18 12:20 AM  Result Value Ref Range Status   MRSA by PCR NEGATIVE NEGATIVE Final    Comment:        The GeneXpert MRSA Assay (FDA approved for NASAL specimens only), is one component of a comprehensive MRSA colonization surveillance program. It is not intended to diagnose MRSA infection nor to guide or monitor treatment for MRSA infections. Performed at [facility], [address], [place], [place] [address]      Scheduled Meds: . calcitonin  4 Units/kg Subcutaneous BID  . feeding supplement (ENSURE ENLIVE)  237 mL Oral BID BM  . folic acid  1 mg Oral Daily  . furosemide  40 mg Intravenous Q6H  . lidocaine  1 patch Transdermal Q24H  . sodium bicarbonate  1,300 mg Oral TID   Continuous Infusions: .  sodium chloride 125 mL/hr at 06/15/18 2231    Procedures/Studies: Ct Abdomen Pelvis Wo Contrast  Result Date: 06/12/2018 CLINICAL DATA:  Abdominal pain and fever EXAM: CT ABDOMEN AND PELVIS WITHOUT CONTRAST TECHNIQUE: Multidetector CT imaging of the abdomen and pelvis was performed following the standard protocol without IV contrast. Oral contrast was administered. COMPARISON:  Aug 12, 2012 FINDINGS: Lower chest: There are free-flowing pleural effusions bilaterally with bibasilar atelectasis. There is questionable mild consolidation in the posterior left lung base as well. Hepatobiliary: No focal liver lesions are appreciable on this noncontrast enhanced study. The gallbladder wall is not appreciably thickened. There is no biliary duct dilatation. Pancreas: No pancreatic mass or inflammatory focus. Spleen: No splenic lesions are evident. Adrenals/Urinary Tract: Adrenals bilaterally appear unremarkable. Kidneys bilaterally show no evident mass or hydronephrosis on either side. There is no  appreciable renal or ureteral calculus on either side. Urinary bladder is midline with wall thickness within normal limits. Stomach/Bowel: There are sigmoid diverticula without overt diverticulitis. There is wall thickening in portions of the mid sigmoid colon without surrounding inflammation. Suspect muscular hypertrophy in this area from chronic diverticulosis. No bowel wall thickening is noted elsewhere. No bowel obstruction appreciable. No free air or portal venous air. Vascular/Lymphatic: There is aortic and bilateral iliac artery atherosclerosis. No aneurysm evident. There is no appreciable adenopathy in the abdomen or pelvis. Reproductive: Prostate and seminal vesicles appear normal in size and contour. There is no appreciable pelvic mass. Other: Appendix appears normal. No abscess or ascites is evident in the abdomen or pelvis. Musculoskeletal: There is extensive postoperative change from L1-L4. There is arthropathy throughout the lumbar region. There are no blastic or lytic bone lesions. There is no intramuscular or abdominal wall lesion evident. IMPRESSION: 1. Moderate free-flowing pleural effusions bilaterally with bibasilar atelectasis. Question a degree of superimposed pneumonia in the posterior left base. 2. Wall thickening in the mid sigmoid colon, likely due to muscular hypertrophy from chronic diverticulosis. Earliest changes of diverticulitis in this area are difficult to entirely exclude. No overt diverticulitis is evident. 3. No bowel obstruction. Appendix appears normal. No evident abscess in the abdomen or pelvis. 4. No evident renal or ureteral calculus. No evident hydronephrosis on either side. 5.  Aortoiliac atherosclerosis. 6.  Extensive postoperative change throughout the lumbar spine. Electronically Signed   By: Lowella Grip III M.D.   On: 06/12/2018 17:14   Dg Chest 2 View  Result Date: 06/12/2018 CLINICAL DATA:  Stomach pain for a week. No vomiting or diarrhea. Fever. EXAM:  CHEST - 2 VIEW COMPARISON:  CT chest 05/28/2016. Chest 11/04/2010 FINDINGS: Normal heart size and pulmonary vascularity. Small left pleural effusion with infiltration in the left lung base. This suggests pneumonia. Right lung is clear. No pneumothorax. Mediastinal contours appear intact. IMPRESSION: Small left pleural effusion with infiltration in the left lung base suggesting pneumonia. Electronically Signed   By: Lucienne Capers M.D.   On: 06/12/2018 21:18   Ct Chest Wo Contrast  Result Date: 06/14/2018 CLINICAL DATA:  58 year old male with history of chest pain and pleural effusion. EXAM: CT CHEST WITHOUT CONTRAST TECHNIQUE: Multidetector CT imaging of the chest was performed following the standard protocol without IV contrast. COMPARISON:  Chest CT 05/20/2016.  Chest x-ray 06/12/2018. FINDINGS: Cardiovascular: Heart size is normal. There is no significant pericardial fluid, thickening or pericardial calcification. Aortic atherosclerosis. No coronary artery calcifications. Mediastinum/Nodes: No pathologically enlarged mediastinal or hilar lymph nodes. Esophagus is unremarkable in appearance. No axillary lymphadenopathy. Lungs/Pleura: Multiple tiny 2-3 mm  pulmonary nodules scattered throughout both lungs, several of which are calcified, compatible with benign granulomas. No larger more suspicious appearing pulmonary nodules or masses are noted. Moderate-sized bilateral pleural effusions lying dependently. This is associated with some mild passive atelectasis in the lower lobes of the lungs bilaterally. No acute consolidative airspace disease. Diffuse bronchial wall thickening with mild centrilobular and paraseptal emphysema. Upper Abdomen: Aortic atherosclerosis. Musculoskeletal: There are no aggressive appearing lytic or blastic lesions noted in the visualized portions of the skeleton. IMPRESSION: 1. Moderate bilateral pleural effusions lying dependently with areas of passive subsegmental atelectasis in the  lower lobes of the lungs bilaterally. 2. Multiple tiny 2-3 mm pulmonary nodules scattered throughout the lungs bilaterally, nonspecific, but similar to prior study from 05/28/2016, considered definitively benign. 3. Aortic atherosclerosis. 4. Diffuse bronchial wall thickening with mild centrilobular and paraseptal emphysema; imaging findings suggestive of underlying COPD. Aortic Atherosclerosis (ICD10-I70.0) and Emphysema (ICD10-J43.9). Electronically Signed   By: Vinnie Langton M.D.   On: 06/14/2018 10:53   Mr Thoracic Spine Wo Contrast  Result Date: 06/14/2018 CLINICAL DATA:  Mid low back pain for 2 months EXAM: MRI THORACIC AND LUMBAR SPINE WITHOUT CONTRAST TECHNIQUE: Multiplanar and multiecho pulse sequences of the thoracic and lumbar spine were obtained without intravenous contrast. COMPARISON:  Lumbar spine MRI 05/06/2018 FINDINGS: Severe patient motion degrading image quality limiting evaluation. Portion of the examination are nondiagnostic. MRI THORACIC SPINE FINDINGS Alignment:  Physiologic. Vertebrae: No fracture, evidence of discitis, or bone lesion. Cord:  Normal signal and morphology. Paraspinal and other soft tissues: No acute paraspinal abnormality. Disc levels: Disc spaces:  Disc spaces are relatively preserved. C6-7: Mild broad-based disc bulge. No foraminal or central canal stenosis. T1-T2: No disc protrusion, foraminal stenosis or central canal stenosis. T2-T3: No disc protrusion, foraminal stenosis or central canal stenosis. T3-T4: Broad-based disc bulge. Moderate bilateral facet arthropathy. Mild spinal stenosis. T4-T5: No disc protrusion, foraminal stenosis or central canal stenosis. Bilateral mild facet arthropathy. T5-T6: No disc protrusion, foraminal stenosis or central canal stenosis. T6-T7: No disc protrusion, foraminal stenosis or central canal stenosis. T7-T8: No disc protrusion, foraminal stenosis or central canal stenosis. T8-T9: No disc protrusion, foraminal stenosis or central  canal stenosis. T9-T10: No disc protrusion, foraminal stenosis or central canal stenosis. T10-T11: No disc protrusion, foraminal stenosis or central canal stenosis. T11-T12: No disc protrusion, foraminal stenosis or central canal stenosis. MRI LUMBAR SPINE FINDINGS Segmentation:  Standard. Alignment:  Physiologic. Vertebrae: No fracture, evidence of discitis, or aggressive bone lesion. Low marrow signal throughout the lumbar spine which is limited in evaluation secondary to patient motion. Conus medullaris and cauda equina: Conus extends to the T12 level. Conus and cauda equina appear normal. Paraspinal and other soft tissues: No acute paraspinal abnormality. Disc levels: Disc spaces: Posterior lumbar interbody fusion from L1 through L4. T12-L1: Minimal broad-based disc bulge. Mild bilateral facet arthropathy. No evidence of neural foraminal stenosis. No central canal stenosis. L1-L2: Interbody fusion. No evidence of neural foraminal stenosis. No central canal stenosis. L2-L3: Interbody fusion. Evaluation is extremely limited secondary to patient motion degrading image quality. There is suggestion of persistent right central/right paracentral epidural soft tissue, but characterization is extremely difficult for given the degree of patient motion. Right foraminal stenosis. No central canal stenosis. L3-L4: Interbody fusion. No evidence of neural foraminal stenosis. No central canal stenosis. L4-L5: Minimal broad-based disc bulge. Mild bilateral facet scratch them moderate bilateral facet arthropathy. Mild left foraminal stenosis. No right foraminal stenosis. No central canal stenosis. L5-S1: Broad-based disc bulge. Moderate  bilateral facet arthropathy. Mild left foraminal stenosis. No significant right foraminal stenosis. No central canal stenosis. IMPRESSION: Severe patient motion degrading image quality limiting evaluation. Portion of the examination are nondiagnostic. MR THORACIC SPINE IMPRESSION 1. No aggressive  osseous lesion to suggest malignancy. 2.  No acute osseous injury of the thoracic spine. MR LUMBAR SPINE IMPRESSION 1. L2-3 is extremely limited in evaluation secondary to patient motion degrading image quality. There is suggestion of persistent right central/right paracentral epidural soft tissue, but characterization is extremely difficult for given the degree of patient motion. Right foraminal stenosis. Recommend repeat MRI of the lumbar spine when the patient is able to better tolerate the examination with less patient motion. 2.  No acute osseous injury of the lumbar spine. Electronically Signed   By: Kathreen Devoid   On: 06/14/2018 19:51   Mr Lumbar Spine Wo Contrast  Result Date: 06/14/2018 CLINICAL DATA:  Mid low back pain for 2 months EXAM: MRI THORACIC AND LUMBAR SPINE WITHOUT CONTRAST TECHNIQUE: Multiplanar and multiecho pulse sequences of the thoracic and lumbar spine were obtained without intravenous contrast. COMPARISON:  Lumbar spine MRI 05/06/2018 FINDINGS: Severe patient motion degrading image quality limiting evaluation. Portion of the examination are nondiagnostic. MRI THORACIC SPINE FINDINGS Alignment:  Physiologic. Vertebrae: No fracture, evidence of discitis, or bone lesion. Cord:  Normal signal and morphology. Paraspinal and other soft tissues: No acute paraspinal abnormality. Disc levels: Disc spaces:  Disc spaces are relatively preserved. C6-7: Mild broad-based disc bulge. No foraminal or central canal stenosis. T1-T2: No disc protrusion, foraminal stenosis or central canal stenosis. T2-T3: No disc protrusion, foraminal stenosis or central canal stenosis. T3-T4: Broad-based disc bulge. Moderate bilateral facet arthropathy. Mild spinal stenosis. T4-T5: No disc protrusion, foraminal stenosis or central canal stenosis. Bilateral mild facet arthropathy. T5-T6: No disc protrusion, foraminal stenosis or central canal stenosis. T6-T7: No disc protrusion, foraminal stenosis or central canal  stenosis. T7-T8: No disc protrusion, foraminal stenosis or central canal stenosis. T8-T9: No disc protrusion, foraminal stenosis or central canal stenosis. T9-T10: No disc protrusion, foraminal stenosis or central canal stenosis. T10-T11: No disc protrusion, foraminal stenosis or central canal stenosis. T11-T12: No disc protrusion, foraminal stenosis or central canal stenosis. MRI LUMBAR SPINE FINDINGS Segmentation:  Standard. Alignment:  Physiologic. Vertebrae: No fracture, evidence of discitis, or aggressive bone lesion. Low marrow signal throughout the lumbar spine which is limited in evaluation secondary to patient motion. Conus medullaris and cauda equina: Conus extends to the T12 level. Conus and cauda equina appear normal. Paraspinal and other soft tissues: No acute paraspinal abnormality. Disc levels: Disc spaces: Posterior lumbar interbody fusion from L1 through L4. T12-L1: Minimal broad-based disc bulge. Mild bilateral facet arthropathy. No evidence of neural foraminal stenosis. No central canal stenosis. L1-L2: Interbody fusion. No evidence of neural foraminal stenosis. No central canal stenosis. L2-L3: Interbody fusion. Evaluation is extremely limited secondary to patient motion degrading image quality. There is suggestion of persistent right central/right paracentral epidural soft tissue, but characterization is extremely difficult for given the degree of patient motion. Right foraminal stenosis. No central canal stenosis. L3-L4: Interbody fusion. No evidence of neural foraminal stenosis. No central canal stenosis. L4-L5: Minimal broad-based disc bulge. Mild bilateral facet scratch them moderate bilateral facet arthropathy. Mild left foraminal stenosis. No right foraminal stenosis. No central canal stenosis. L5-S1: Broad-based disc bulge. Moderate bilateral facet arthropathy. Mild left foraminal stenosis. No significant right foraminal stenosis. No central canal stenosis. IMPRESSION: Severe patient  motion degrading image quality limiting evaluation. Portion of the  examination are nondiagnostic. MR THORACIC SPINE IMPRESSION 1. No aggressive osseous lesion to suggest malignancy. 2.  No acute osseous injury of the thoracic spine. MR LUMBAR SPINE IMPRESSION 1. L2-3 is extremely limited in evaluation secondary to patient motion degrading image quality. There is suggestion of persistent right central/right paracentral epidural soft tissue, but characterization is extremely difficult for given the degree of patient motion. Right foraminal stenosis. Recommend repeat MRI of the lumbar spine when the patient is able to better tolerate the examination with less patient motion. 2.  No acute osseous injury of the lumbar spine. Electronically Signed   By: Kathreen Devoid   On: 06/14/2018 19:51   US Renal  Result Date: 06/14/2018 CLINICAL DATA:  Acute on chronic renal failure EXAM: RENAL / URINARY TRACT ULTRASOUND COMPLETE COMPARISON:  06/12/2018 FINDINGS: Right Kidney: Renal measurements: 11.6 x 4.9 x 7.6 cm = volume: 224 mL. Diffuse increased echogenicity is noted. No mass or hydronephrosis is noted. Left Kidney: Renal measurements: 11.9 x 6.5 x 5.5 cm = volume: 222 mL. Increased echogenicity is noted. No mass lesion or hydronephrosis is seen. Bladder: Appears normal for degree of bladder distention. Bilateral pleural effusions are noted similar to that seen on prior CT. IMPRESSION: Diffuse increased echogenicity consistent with the given clinical history. Bilateral small effusions. Electronically Signed   By: Inez Catalina M.D.   On: 06/14/2018 10:30   Dg Bone Survey Met  Result Date: 06/15/2018 CLINICAL DATA:  58 year old male with a history of hypercalcemia EXAM: METASTATIC BONE SURVEY COMPARISON:  Chest CT 06/14/2018, plain film 06/12/2018, lumbar CT 05/06/2018 FINDINGS: Skull: No acute fracture. No lytic or sclerotic lesion identified.  Endodontal disease. Cervical spine: Surgical changes of anterior cervical  discectomy infusion with anterior plate screw fixation [id number] with no complicating features. Degenerative changes at the levels above and below. No lytic lesions or sclerotic lesions.  Alignment maintained. Thoracic spine: Vertebral body heights maintained with alignment. Mild disc disease of the thoracic spine. No acute fracture. No lytic or sclerotic lesions. Unremarkable appearance of the pedicles. Lumbar spine: Surgical changes of posterior lumbar interbody fusion with bilateral pedicle screw and rod fixation spanning L1-L4. No complicating features. No acute fracture. The lytic changes that were identified on prior CT are not visualized on the current plain film. Moderate disc disease and facet disease of the lumbar spine. No lytic or sclerotic lesions identified. Chest: Cardiomediastinal silhouette within normal limits. Blunting of the bilateral costophrenic angles with obscuration of the hemidiaphragms bilaterally. No interlobular septal thickening. No rib fracture identified. Pelvis: No acute fracture. Sclerotic changes at the inferior aspect of the left sacroiliac joint on the iliac aspect, similar to prior CT abdomen. No lytic changes. Degenerative changes of the hips. Right upper extremity: No acute fracture.  No lucent lesion or sclerotic focus. Left upper extremity: No acute fracture.  No lucent lesion or sclerotic changes. Right lower extremity: No acute fracture.  No no lucent lesion or sclerotic lesion. Left lower extremity: No acute fracture.  No lucent lesion or sclerotic lesion. IMPRESSION: Abnormal lucent changes identified in the lumbar spine on the prior CT dated 05/06/2018 are not visualized on the current plain film. If concern for malignancy, correlation with either nuclear medicine bone scan or PET-CT may be useful. Sclerotic focus at the inferior aspect of the left sacral iliac joint, present on prior CT pelvis. If concern for malignancy, correlation with either nuclear medicine bone scan  or PET-CT may be useful. Bilateral small pleural effusions, present on prior CT.  Electronically Signed   By: Corrie Mckusick D.O.   On: 06/15/2018 09:22   Dg Inject Diag/thera/inc Needle/cath/plc Epi/lumb/sac W/img  Result Date: 05/27/2018 CLINICAL DATA:  Previous fusion surgery. Back and right lower extremity pain. EXAM: SELECTIVE NERVE ROOT BLOCK AND TRANSFORAMINAL EPIDURAL STEROID INJECTION UNDER FLUOROSCOPY FLUOROSCOPY TIME:  43 seconds; 38 uGym2 DAP TECHNIQUE: The procedure, risks (including but not limited to bleeding, infection, organ damage ), benefits, and alternatives were explained to the patient. Questions regarding the procedure were encouraged and answered. The patient understands and consents to the procedure. An appropriate skin entry site was determined under fluoroscopy. Operator donned sterile gloves and mask. Site was marked, prepped with Betadine, draped in usual sterile fashion, infiltrated locally with 1% lidocaine. A 22 gauge spinal needle was advanced to the superior ventral margin of the right L2-3 neural foramen. Diagnostic injection of 2 ml Omnipaque 180 showed partial outlining of the exiting nerve root as well as minimal epidural extension of contrast, with no intravascular or subarachnoid component. 120 mg Depo-Medrol in 3 ml lidocaine 1% was administered. The patient tolerated procedure well, with no immediate complication. IMPRESSION: 1. Technically successful right L2 selective nerve root block and transforaminal epidural steroid injection Electronically Signed   By: Lucrezia Europe M.D.   On: 05/27/2018 15:28    Orson Eva, DO  [facility] Pager [phone]  If 7PM-7AM, please contact night-coverage [email or web address] Password [facility] 06/16/2018, 6:48 AM   LOS: 4 days

## 2018-06-16 NOTE — Progress Notes (Signed)
Care Management Handoff Summary  Patient Details  Name: [name]  MRN: [id number]  DOB: [birth year]  Report post IR Bone Marrow Biopsy called to [facility] at [facility]. Pt transport via [facility] back to [facility]. Procedure discharge instructions copy to [place], RN.  Hanley Seamen, RN 06/16/2018, 12:48 PM

## 2018-06-16 NOTE — Procedures (Signed)
Interventional Radiology Procedure Note  Procedure: CT guided aspirate and core biopsy of left posterior iliac bone Complications: None Recommendations: - Bedrest supine x 1 hrs - OTC's PRN  Pain - Follow biopsy results  Signed,  Aileene Lanum S. Nolin Grell, DO    

## 2018-06-16 NOTE — Patient Instructions (Signed)
[facility] Chemotherapy Teaching    You have been diagnosed with multiple myeloma.  You will be treated weekly with bortezomib (Velcade), cyclophosphamide (Cytoxan), and dexamethasone.  The intent of this treatment is to get and keep your cancer under control. You will see the doctor regularly throughout treatment.  We monitor your lab work prior to every treatment. The doctor monitors your response to treatment by the way you are feeling, your blood work, and scans periodically.  There will be wait times while you are here for treatment.  It will take about 30 minutes to 1 hour for your lab work to result.  Then there will be wait times while pharmacy mixes your medications.    Cyclophosphamide (Generic Name) Other Names: Cytoxan, Neosar  About This Drug Cyclophosphamide is a drug used to treat cancer. It is given in the vein (IV) or by mouth.  Takes 30 minutes for this drug to infuse.  Possible Side Effects (More Common) . Nausea and throwing up (vomiting). These symptoms may happen within a few hours after your treatment and may last up to 72 hours. Medicines are available to stop or lessen these side effects. . Bone marrow depression. This is a decrease in the number of white blood cells, red blood cells, and platelets. This may raise your risk of infection, make you tired and weak (fatigue), and raise your risk of bleeding. . Hair loss: You may notice hair getting thin. Some patients lose their hair. Hair loss is often complete scalp hair loss and can involve loss of eyebrows, eyelashes, and pubic hair. You may notice this a few days or weeks after treatment has started. Most often hair loss is temporary; your hair should grow back when treatment is done. . Decreased appetite (decreased hunger) . Blurred vision . Soreness of the mouth and throat. You may have red areas, white patches, or sores that hurt. . Effects on the bladder. This drug may cause irritation and bleeding  in the bladder. You may have blood in your urine. To help stop this, you will get extra fluids to help you pass more urine. You may get a drug called mesna, which helps to decrease irritation and bleeding. You may also get a medicine to help you pass more urine. You may have a catheter (tube) placed in your bladder so that your bladder will be washed with this drug.  Possible Side Effects (Less Common) . Darkening of the skin or nails . Metallic taste in the mouth . Changes in lung tissue may happen with large amounts of this drug. These changes may not last forever, and your lung tissue may go back to normal. Sometimes these changes may not be seen for many years. You may get a cough or have trouble catching your breath.  Allergic Reactions   Serious allergic reactions including anaphylaxis are rare. While you are getting this drug in your vein (IV), tell your nurse right away if you have any of these symptoms of an allergic reaction: . Trouble catching your breath . Feeling like your tongue or throat are swelling . Feeling your heart beat quickly or in a not normal way (palpitations) . Feeling dizzy or lightheaded . Flushing, itching, rash, and/or hives  Treating Side Effects . Drink 6-8 cups of fluids each day unless your doctor has told you to limit your fluid intake due to some other health problem. A cup is 8 ounces of fluid. If you throw up or have loose bowel movements  you should drink more fluids so that you do not become dehydrated (lack water in the body due to losing too much fluid). . Ask your doctor or nurse about medicine that is available to help stop or lessen nausea or throwing up. . Mouth care is very important. Your mouth care should consist of routine, gentle cleaning of your teeth or dentures and rinsing your mouth with a mixture of 1/2 teaspoon of salt in 8 ounces of water or  teaspoon of baking soda in 8 ounces of water. This should be done at least after each meal and  at bedtime. . If you have mouth sores, avoid mouthwash that has alcohol. Also avoid alcohol and smoking because they can bother your mouth and throat. . Talk with your nurse about getting a wig before you lose your hair. Also, call the [place] at 800-ACS-2345 to find out information about the " Look Good.Marland KitchenMarland KitchenFeel Better" program close to where you live. It is a free program where women undergoing chemotherapy learn about wigs, turbans and scarves as well as makeup techniques and skin and nail care.  Important Information . Whenever you tell a doctor or nurse your health history, always tell them that you have received cyclophosphamide in the past. . If you take this drug by mouth swallow the medicine whole. Do not chew, break or crush it. . You can take the medicine with or without food. If you have nausea, take it with food. Do not take the pills at bedtime.  Food and Drug Interactions There are no known interactions of cyclophosphamide with food. This drug may interact with other medicines. Tell your doctor and pharmacist about all the medicines and dietary supplements (vitamins, minerals, herbs and others) that you are taking at this time. The safety and use of dietary supplements and alternative diets are often not known. Using these might affect your cancer or interfere with your treatment. Until more is known, you should not use dietary supplements or alternative diets without your cancer doctor's help.  When to Call the Doctor Call your doctor or nurse right away if you have any of these symptoms: . Fever of 100.5 F (38 C) or higher . Chills . Bleeding or bruising that is not normal . Blurred vision or other changes in eyesight . Pain when passing urine; blood in urine . Pain in your lower back or side . Wheezing or trouble breathing . Swelling of legs, ankles, or feet . Feeling dizzy or lightheaded . Feeling confused or agitated . Signs of liver problems: dark urine,  pale bowel movements, bad stomach pain, feeling very tired and weak, unusual itching, or yellowing of the eyes or skin . Unusual thirst or passing urine often . Nausea that stops you from eating or drinking . Throwing up more than 3 times a day  Call your doctor or nurse as soon as possible if any of these symptoms happen: . Pain in your mouth or throat that makes it hard to eat or drink . Nausea not relieved by prescribed medicines  Sexual Problems and Reproductive Concerns . Infertility warning: Sexual problems and reproduction concerns may happen. In both men and women, this drug may affect your ability to have children. This cannot be determined before your treatment. Talk with your doctor or nurse if you plan to have children. Ask for information on sperm or egg banking. . In men, this drug may interfere with your ability to make sperm, but it should not change your  ability to have sexual relations. . In women, menstrual bleeding may become irregular or stop while you are getting this drug. Do not assume that you cannot become pregnant if you do not have a menstrual period. . Women may go through signs of menopause (change of life) like vaginal dryness or itching. Vaginal lubricants can be used to lessen vaginal dryness, itching, and pain during sexual relations. . Genetic counseling is available for you to talk about the effects of this drug therapy on future pregnancies. Also, a genetic counselor can look at the possible risk of problems in the unborn baby due to this medicine if an exposure happens during pregnancy.   Bortezomib (Velcade)  About This Drug  Bortezomib is used to treat cancer. It is given in the vein (IV) or by a shot under the skin (subcutaneously).  You will receive this injection under your skin.  Possible Side Effects  . Bone marrow suppression. Decrease in the number of white blood cells, red blood cells, and platelets. This may raise your risk of infection,  make you tired and weak (fatigue), and raise your risk of bleeding.  . Nausea and vomiting (throwing up)  . Constipation (not able to move bowels)  . Diarrhea (loose bowel movements)  . Fever  . Tiredness  . Decreased appetite (decreased hunger)  . Effects on the nerves called peripheral neuropathy. You may feel numbness, tingling, or pain in your hands and feet. It may be hard for you to button your clothes, open jars, or walk as usual. The effect on the nerves may get worse with more doses of the drug. These effects get better in some people after the drug is stopped but it does not get better in all people.  . Rash  Note: Each of the side effects above was reported in 20% or greater of patients treated with bortezomib. Not all possible side effects are included above.  Warnings and Precautions  . Severe peripheral neuropathy  . Low blood pressure  . Congestive heart failure - your heart has less ability to pump blood properly.  . Trouble breathing because of fluid build-up and/or inflammation in your lungs  . Nausea, vomiting, diarrhea and constipation which sometimes requires treatment to help lessen these side effects. There is also an increased risk of developing a partial or complete blockage of your small and/or large intestine.  . Changes in your central nervous system can happen. The central nervous system is made up of your brain and spinal cord. You could feel extreme tiredness, agitation, confusion, have hallucinations (see or hear things that are not there), trouble understanding or speaking, loss of control of your bowels or bladder, eyesight changes, numbness or lack of strength to your arms, legs, face, or body, seizures or coma. If you start to have any of these symptoms let your doctor know right away.  . Tumor lysis syndrome: This drug may act on the cancer cells very quickly. This may affect how your kidneys work.  . Changes in your liver  function Increased risk of a syndrome that affects your red blood cells, platelets and blood vessels in your kidneys, which can cause kidney failure and be life-threatening.  Important Information  . This drug may be present in the saliva, tears, sweat, urine, stool, vomit, semen, and vaginal secretions. Talk to your doctor and/or your nurse about the necessary precautions to take during this time.  . This drug may impair your ability to drive or use machinery. Use caution  and tell your nurse or doctor if you feel dizzy, very sleepy, and/or experience low blood pressure.  Treating Side Effects  . Manage tiredness by pacing your activities for the day.  . Be sure to include periods of rest between energy-draining activities.  . To decrease the risk of infection, wash your hands regularly.  . Avoid close contact with people who have a cold, the flu, or other infections.  . Take your temperature as your doctor or nurse tells you, and whenever you feel like you may have a fever.  . To help decrease the risk of bleeding, use a soft toothbrush. Check with your nurse before using dental floss.  . Be very careful when using knives or tools.  . Use an electric shaver instead of a razor.  . Ask your doctor or nurse about medicines that are available to help stop or lessen constipation.  . If you are not able to move your bowels, check with your doctor or nurse before you use enemas, laxatives, or suppositories.  . Drink plenty of fluids (a minimum of eight glasses per day is recommended).  . If you throw up or have loose bowel movements, you should drink more fluids so that you do not become dehydrated (lack of water in the body from losing too much fluid).  . If you have diarrhea, eat low-fiber foods that are high in protein and calories and avoid foods that can irritate your digestive tracts or lead to cramping.  . Ask your nurse or doctor about medicine that can lessen or stop  your diarrhea.  . To help with nausea and vomiting, eat small, frequent meals instead of three large meals a day. Choose foods and drinks that are at room temperature. Ask your nurse or doctor about other helpful tips and medicine that is available to help stop or lessen these symptoms.  . To help with decreased appetite, eat foods high in calories and protein, such as meat, poultry, fish, dry beans, tofu, eggs, nuts, milk, yogurt, cheese, ice cream, pudding, and nutritional supplements.  . Consider using sauces and spices to increase taste. Daily exercise, with your doctor's approval, may increase your appetite.  . If you have numbness and tingling in your hands and feet, be careful when cooking, walking, and handling sharp objects and hot liquids.  . If you get a rash do not put anything on it unless your doctor or nurse says you may. Keep the area around the rash clean and dry. Ask your doctor for medicine if your rash bothers you.  Food and Drug Interactions  . This drug may interact with grapefruit and grapefruit juice. Talk to your doctor as this could make side effects worse.  . Check with your doctor or pharmacist about all other prescription medicines and over-the-counter medicines and dietary supplements (vitamins, minerals, herbs and others) you are taking before starting this medicine as there are known drug interactions with bortezomib. Also, check with your doctor or pharmacist before starting any new prescription or over-the-counter medicines, or dietary supplements to make sure that there are no interactions.  . Avoid the use of St. John's Wort with bortezomib as this may lower the levels of the drug in your body, which can make it less effective.  When to Call the Doctor  Call your doctor or nurse if you have any of these symptoms and/or any new or unusual symptoms:  . Fever of 100.4 F (38 C) or higher  . Chills  .  Tiredness that interferes with your daily  activities  . Feeling dizzy or lightheaded  . Feeling that your heart is beating in a fast or not normal way (palpitations)  . Cough  . Wheezing or trouble breathing  . Easy bleeding or bruising  . Confusion and/or agitation  . Hallucinations  . Trouble understanding or speaking  . Blurry vision or changes in your eyesight  . Numbness or lack of strength to your arms, legs, face, or body  . Symptoms of a seizure such as confusion, blacking out, passing out, loss of hearing or vision, blurred vision, unusual smells or tastes (such as burning rubber), trouble talking, tremors or shaking in parts or all of the body, repeated body movements, tense muscles that do not relax, and loss of control of urine and bowels. If you or your family member suspects you are having a seizure, call 911 right away.  . Nausea that stops you from eating or drinking and/or is not relieved by prescribed medicines  . Throwing up more than 3 times a day  . Lasting loss of appetite or rapid weight loss of five pounds in a week  . No bowel movement in 3 days or when you feel uncomfortable.  . Abdominal pain that does not go away  . Diarrhea, 4 times in one day or diarrhea with lack of strength or a feeling of being dizzy  . Numbness, tingling, or pain your hands and feet  . Swelling of legs, ankles, and/or feet  . Weight gain of 5 pounds in one week (fluid retention)  . Decreased urine, or very dark urine  . New rash and/or itching  . Rash that is not relieved by prescribed medicines  . Signs of tumor lysis: Confusion or agitation, decreased urine, nausea/vomiting, diarrhea, muscle cramping, numbness and/or tingling, seizures.  . Signs of possible liver problems: dark urine, pale bowel movements, bad stomach pain, feeling very tired and weak, unusual itching, or yellowing of the eyes or skin  . If you think you are pregnant or may have impregnated your partner  Reproduction  Warnings  . Pregnancy warning: This drug can have harmful effects on the unborn baby. Women of child bearing potential should use effective methods of birth control during your cancer treatment and for at least 7 months after treatment. Men with male partners of childbearing potential should use effective methods of birth control during your cancer treatment and for at least 4 months after your cancer treatment. Let your doctor know right away if you think you may be pregnant or may have impregnated your partner.  . Breastfeeding warning: Women should not breastfeed during treatment and for 2 months month after treatment because this drug could enter the breast milk and cause harm to a breastfeeding baby.  . Fertility warning: In men and women both, this drug may affect your ability to have children in the future. Talk with your doctor or nurse if you plan to have children. Ask for information on sperm or egg banking.   Dexamethasone (Decadron)  About This Drug  Dexamethasone is used to treat cancer, to decrease inflammation and sometimes used before and after chemotherapy to prevent or treat nausea and/or vomiting. It is given in the vein (IV) or orally (by mouth).  Possible Side Effects  . Headache  . High blood pressure  . Abnormal heart beat  . Tiredness and weakness  . Changes in mood, which may include depression or a feeling of extreme well-being  .  Trouble sleeping  . Increased sweating  . Increased appetite (increased hunger)  . Weight gain  . Increase risk of infections  . Pain in your abdomen  . Nausea  . Skin changes such as rash, dryness, redness  . Blood sugar levels may change  . Electrolyte changes  . Swelling of your legs, ankles and/or feet  . Changes in your liver function  . You may be at risk for cataracts, glaucoma or infections of the eye  . Muscle loss and / or weakness (lack of muscle strength)  . Increased risk of  developing osteoporosis- your bones may become weak and brittle  Note: Not all possible side effects are included above.  Warnings and Precautions  . This drug may cause you to feel irritable, nervous or restless.  . Allergic reactions, including anaphylaxis are rare but may happen in some patients. Signs of allergic reaction to this drug may be swelling of the face, feeling like your tongue or throat are swelling, trouble breathing, rash, itching, fever, chills, feeling dizzy, and/or feeling that your heart is beating in a fast or not normal way. If this happens, do not take another dose of this drug. You should get urgent medical treatment.  . High blood pressure and changes in electrolytes, which can cause fluid build-up around your heart, lungs or elsewhere.  . Increased risk of developing a hole in your stomach, small, and/or large intestine if you have ulcers in the lining of your stomach and/or intestine, or have diverticulitis, ulcerative colitis and/or other diseases that affect the gastrointestinal tract.  . Effects on the endocrine glands including the pituitary, adrenals or thyroid during or after use of this medication.  . Changes in the tissue of the heart, that can cause your heart to have less ability to pump blood. You may be short of breath or our arms, hands, legs and feet may swell.  . Increased risk of heart attack.  . Severe depression and other psychiatric disorders such as mood changes.  . Burning, pain and itching around your anus may happen when this drug is given in the vein too rapidly (IV). It usually happens suddenly and resolves in less than 1 minute.  Important Information  . Talk to your doctor or your nurse before stopping this medication, it should be stopped gradually. Depending on the dose and length of treatment, you could experience serious side effects if stopped abruptly (suddenly).  . Talk to your doctor before receiving any  vaccinations during your treatment. Some vaccinations are not recommended while receiving dexamethasone.  How to Take Your Medication  . For Oral (by mouth): You can take the medicine with or without food. If you have nausea or upset stomach, take it with food.  . Missed dose: If you miss a dose, do not take 2 doses at the same time or extra doses. . If you vomit a dose, take your next dose at the regular time. Do not take 2 doses at the same time  . Handling: Wash your hands after handling your medicine, your caretakers should not handle your medicine with bare hands and should wear latex gloves.  . Storage: Store this medicine in the original container at room temperature. Protect from moisture and light. Discuss with your nurse or your doctor how to dispose of unused medicine.  Treating Side Effects  . Drink plenty of fluids (a minimum of eight glasses per day is recommended).  . To help with nausea and vomiting, eat small,  frequent meals instead of three large meals a day. Choose foods and drinks that are at room temperature. Ask your nurse or doctor about other helpful tips and medicine that is available to help stop or lessen these symptoms.  . If you throw up, you should drink more fluids so that you do not become dehydrated (lack of water in the body from losing too much fluid).  . Manage tiredness by pacing your activities for the day.  . Be sure to include periods of rest between energy-draining activities.  . To help with muscle weakness, get regular exercise. If you feel too tired to exercise vigorously, try taking a short walk.  . If you are having trouble sleeping, talk to your nurse or doctor on tips to help you sleep better.  . If you are feeling depressed, talk to your nurse or doctor about it.  Marland Kitchen Keeping your pain under control is important to your well-being. Please tell your doctor or nurse if you are experiencing pain.  . If you have diabetes, keep good  control of your blood sugar level. Tell your nurse or your doctor if your glucose levels are higher or lower than normal.  . To decrease the risk of infection, wash your hands regularly.  . Avoid close contact with people who have a cold, the flu, or other infections.  . Take your temperature as your doctor or nurse tells you, and whenever you feel like you may have a fever.  . If you get a rash do not put anything on it unless your doctor or nurse says you may. Keep the area around the rash clean and dry. Ask your doctor for medicine if your rash bothers you.  . Moisturize your skin several times day.  . Avoid sun exposure and apply sunscreen routinely when outdoors.  Food and Drug Interactions  . There are no known interactions of dexamethasone with food.  . Check with your doctor or pharmacist about all other prescription medicines and over-the-counter medicines and dietary supplements (vitamins, minerals, herbs and others) you are taking before starting this medicine as there are known drug interactions with dexamethasone. Also, check with your doctor or pharmacist before starting any new prescription or over-the-counter medicines, or dietary supplement to make sure that there are no interactions.  . There are known interactions of dexamethasone with other medicines and products like acetaminophen, aspirin, and ibuprofen. Ask your doctor what over-the-counter (OTC) medicines you can take.  When to Call the Doctor  Call your doctor or nurse if you have any of these symptoms and/or any new or unusual symptoms:  . Fever of 100.4 F (38 C) or higher  . Chills  . A headache that does not go away  . Trouble breathing  . Blurry vision or other changes in eyesight  . Feel irritable, nervous or restless  . Trouble falling or staying asleep  . Severe mood changes such as depression or unusual thoughts and/or behaviors  . Thoughts of hurting yourself or others, and  suicide  . Tiredness that interferes with your daily activities  . Feeling that your heart is beating in a fast, slow or not normal way  . Feeling dizzy or lightheaded  . Chest pain or symptoms of a heart attack. Most heart attacks involve pain in the center of the chest that lasts more than a few minutes. The pain may go away and come back, or it can be constant. It can feel like pressure, squeezing, fullness, or  pain. Sometimes pain is felt in one or both arms, the back, neck, jaw, or stomach. If any of these symptoms last 2 minutes, call 911.  Marland Kitchen Heartburn or indigestion  . Nausea that stops you from eating or drinking and/or is not relieved by prescribed medicines  . Throwing up more than 3 times a day  . Pain in your abdomen that does not go away  . Abnormal blood sugar  . Unusual thirst, passing urine often, headache, sweating, shakiness, irritability  . Swelling of legs, ankles, or feet  . Weight gain of 5 pounds in one week (fluid retention)  . Signs of possible liver problems: dark urine, pale bowel movements, bad stomach pain, feeling very tired and weak, unusual itching, or yellowing of the eyes or skin  . Severe muscle weakness  . A new rash or a rash that is not relieved by prescribed medicines  . Signs of allergic reaction: swelling of the face, feeling like your tongue or throat are swelling, trouble breathing, rash, itching, fever, chills, feeling dizzy, and/or feeling that your heart is beating in a fast or not normal way. If this happens, call 911 for emergency care.  . If you think you may be pregnant  Reproduction Warnings  . Fertility warning: Human fertility studies have not been done with this drug. Talk with your doctor or nurse if you plan to have children. Ask for information on sperm banking.   SELF CARE ACTIVITIES WHILE ON CHEMOTHERAPY:  Hydration Increase your fluid intake 48 hours prior to treatment and drink at least 8 to 12 cups (64  ounces) of water/decaffeinated beverages per day after treatment. You can still have your cup of coffee or soda but these beverages do not count as part of your 8 to 12 cups that you need to drink daily. No alcohol intake.  Medications Continue taking your normal prescription medication as prescribed.  If you start any new herbal or new supplements please let us know first to make sure it is safe.  Mouth Care Have teeth cleaned professionally before starting treatment. Keep dentures and partial plates clean. Use soft toothbrush and do not use mouthwashes that contain alcohol. Biotene is a good mouthwash that is available at most pharmacies or may be ordered by calling [phone]. Use warm salt water gargles (1 teaspoon salt per 1 quart warm water) before and after meals and at bedtime. Or you may rinse with 2 tablespoons of three-percent hydrogen peroxide mixed in eight ounces of water. If you are still having problems with your mouth or sores in your mouth please call the clinic. If you need dental work, please let the doctor know before you go for your appointment so that we can coordinate the best possible time for you in regards to your chemo regimen. You need to also let your dentist know that you are actively taking chemo. We may need to do labs prior to your dental appointment.  Skin Care Always use sunscreen that has not expired and with SPF (Sun Protection Factor) of 50 or higher. Wear hats to protect your head from the sun. Remember to use sunscreen on your hands, ears, face, & feet.  Use good moisturizing lotions such as udder cream, eucerin, or even Vaseline. Some chemotherapies can cause dry skin, color changes in your skin and nails.    . Avoid long, hot showers or baths. . Use gentle, fragrance-free soaps and laundry detergent. . Use moisturizers, preferably creams or ointments rather than lotions  because the thicker consistency is better at preventing skin dehydration. Apply the  cream or ointment within 15 minutes of showering. Reapply moisturizer at night, and moisturize your hands every time after you wash them.  Hair Loss (if your doctor says your hair will fall out)  . If your doctor says that your hair is likely to fall out, decide before you begin chemo whether you want to wear a wig. You may want to shop before treatment to match your hair color. . Hats, turbans, and scarves can also camouflage hair loss, although some people prefer to leave their heads uncovered. If you go bare-headed outdoors, be sure to use sunscreen on your scalp. . Cut your hair short. It eases the inconvenience of shedding lots of hair, but it also can reduce the emotional impact of watching your hair fall out. . Don't perm or color your hair during chemotherapy. Those chemical treatments are already damaging to hair and can enhance hair loss. Once your chemo treatments are done and your hair has grown back, it's OK to resume dyeing or perming hair.  With chemotherapy, hair loss is almost always temporary. But when it grows back, it may be a different color or texture. In older adults who still had hair color before chemotherapy, the new growth may be completely gray.  Often, new hair is very fine and soft.  Infection Prevention Please wash your hands for at least 30 seconds using warm soapy water. Handwashing is the #1 way to prevent the spread of germs. Stay away from sick people or people who are getting over a cold. If you develop respiratory systems such as green/yellow mucus production or productive cough or persistent cough let us know and we will see if you need an antibiotic. It is a good idea to keep a pair of gloves on when going into grocery stores/[employer] to decrease your risk of coming into contact with germs on the carts, etc. Carry alcohol hand gel with you at all times and use it frequently if out in public. If your temperature reaches 100.5 or higher please call the clinic and let  us know.  If it is after hours or on the weekend please go to the ER if your temperature is over 100.5.  Please have your own personal thermometer at home to use.    Sex and bodily fluids If you are going to have sex, a condom must be used to protect the person that isn't taking chemotherapy. Chemo can decrease your libido (sex drive). For a few days after chemotherapy, chemotherapy can be excreted through your bodily fluids.  When using the toilet please close the lid and flush the toilet twice.  Do this for a few day after you have had chemotherapy.   Effects of chemotherapy on your sex life Some changes are simple and won't last long. They won't affect your sex life permanently.  Sometimes you may feel: . too tired . not strong enough to be very active . sick or sore  . not in the mood . anxious or low Your anxiety might not seem related to sex. For example, you may be worried about the cancer and how your treatment is going. Or you may be worried about money, or about how you family are coping with your illness. These things can cause stress, which can affect your interest in sex. It's important to talk to your partner about how you feel. Remember - the changes to your sex life don't usually  last long. There's usually no medical reason to stop having sex during chemo. The drugs won't have any long term physical effects on your performance or enjoyment of sex. Cancer can't be passed on to your partner during sex  Contraception It's important to use reliable contraception during treatment. Avoid getting pregnant while you or your partner are having chemotherapy. This is because the drugs may harm the baby. Sometimes chemotherapy drugs can leave a man or woman infertile.  This means you would not be able to have children in the future. You might want to talk to someone about permanent infertility. It can be very difficult to learn that you may no longer be able to have children. Some people find  counselling helpful. There might be ways to preserve your fertility, although this is easier for men than for women. You may want to speak to a fertility expert. You can talk about sperm banking or harvesting your eggs. You can also ask about other fertility options, such as donor eggs. If you have or have had breast cancer, your doctor might advise you not to take the contraceptive pill. This is because the hormones in it might affect the cancer.  It is not known for sure whether or not chemotherapy drugs can be passed on through semen or secretions from the vagina. Because of this some doctors advise people to use a barrier method if you have sex during treatment. This applies to vaginal, anal or oral sex. Generally, doctors advise a barrier method only for the time you are actually having the treatment and for about a week after your treatment. Advice like this can be worrying, but this does not mean that you have to avoid being intimate with your partner. You can still have close contact with your partner and continue to enjoy sex.  Animals If you have cats or birds we just ask that you not change the litter or change the cage.  Please have someone else do this for you while you are on chemotherapy.   Food Safety During and After Cancer Treatment Food safety is important for people both during and after cancer treatment. Cancer and cancer treatments, such as chemotherapy, radiation therapy, and stem cell/bone marrow transplantation, often weaken the immune system. This makes it harder for your body to protect itself from foodborne illness, also called food poisoning. Foodborne illness is caused by eating food that contains harmful bacteria, parasites, or viruses.  Foods to avoid Some foods have a higher risk of becoming tainted with bacteria. These include: Marland Kitchen Unwashed fresh fruit and vegetables, especially leafy vegetables that can hide dirt and other contaminants . Raw sprouts, such as alfalfa  sprouts . Raw or undercooked beef, especially ground beef, or other raw or undercooked meat and poultry . Fatty, fried, or spicy foods immediately before or after treatment.  These can sit heavy on your stomach and make you feel nauseous. . Raw or undercooked shellfish, such as oysters. . Sushi and sashimi, which often contain raw fish.  . Unpasteurized beverages, such as unpasteurized fruit juices, raw milk, raw yogurt, or cider . Undercooked eggs, such as soft boiled, over easy, and poached; raw, unpasteurized eggs; or foods made with raw egg, such as homemade raw cookie dough and homemade mayonnaise  Simple steps for food safety  Shop smart. . Do not buy food stored or displayed in an unclean area. . Do not buy bruised or damaged fruits or vegetables. . Do not buy cans that have cracks, dents,  or bulges. . Pick up foods that can spoil at the end of your shopping trip and store them in a cooler on the way home.  Prepare and clean up foods carefully. . Rinse all fresh fruits and vegetables under running water, and dry them with a clean towel or paper towel. . Clean the top of cans before opening them. . After preparing food, wash your hands for 20 seconds with hot water and soap. Pay special attention to areas between fingers and under nails. . Clean your utensils and dishes with hot water and soap. Marland Kitchen Disinfect your kitchen and cutting boards using 1 teaspoon of liquid, unscented bleach mixed into 1 quart of water.    Dispose of old food. . Eat canned and packaged food before its expiration date (the "use by" or "best before" date). . Consume refrigerated leftovers within 3 to 4 days. After that time, throw out the food. Even if the food does not smell or look spoiled, it still may be unsafe. Some bacteria, such as Listeria, can grow even on foods stored in the refrigerator if they are kept for too long.  Take precautions when eating out. . At restaurants, avoid buffets and salad bars  where food sits out for a long time and comes in contact with many people. Food can become contaminated when someone with a virus, often a norovirus, or another "bug" handles it. . Put any leftover food in a "to-go" container yourself, rather than having the server do it. And, refrigerate leftovers as soon as you get home. . Choose restaurants that are clean and that are willing to prepare your food as you order it cooked.   MEDICATIONS:                                                                                                                                                                Compazine/Prochlorperazine 96m tablet. Take 1 tablet every 6 hours as needed for nausea/vomiting. (This can make you sleepy)   EMLA cream. Apply a quarter size amount to port site 1 hour prior to chemo. Do not rub in. Cover with plastic wrap.   Over-the-Counter Meds:  Colace - 100 mg capsules - take 2 capsules daily.  If this doesn't help then you can increase to 2 capsules twice daily.  Call u[place]if this does not help your bowels move.   Imodium 29mcapsule. Take 2 capsules after the 1st loose stool and then 1 capsule every 2 hours until you go a total of 12 hours without having a loose stool. Call the [facility] loose stools continue. If diarrhea occurs at bedtime, take 2 capsules at bedtime. Then take 2 capsules every 4 hours until morning. Call [facility]   Diarrhea Sheet  If you are having loose stools/diarrhea, please purchase Imodium and begin taking as outlined:  At the first sign of poorly formed or loose stools you should begin taking Imodium (loperamide) 2 mg capsules.  Take two tablets (44m) followed by one tablet (272m every 2 hours - DO NOT EXCEED 8 tablets in 24 hours.  If it is bedtime and you are having loose stools, take 2 tablets at bedtime, then 2 tablets every 4 hours until morning.   Always call the CaSt. Lawrencef you are having loose stools/diarrhea that you can't  get under control.  Loose stools/diarrhea leads to dehydration (loss of water) in your body.  We have other options of trying to get the loose stools/diarrhea to stop but you must let usKoreanow!   Constipation Sheet  Colace - 100 mg capsules - take 2 capsules daily.  If this doesn't help then you can increase to 2 capsules twice daily.  Please call if the above does not work for you.   Do not go more than 2 days without a bowel movement.  It is very important that you do not become constipated.  It will make you feel sick to your stomach (nausea) and can cause abdominal pain and vomiting.   Nausea Sheet   Compazine/Prochlorperazine 1053mablet. Take 1 tablet every 6 hours as needed for nausea/vomiting. (This can make you sleepy)  If you are having persistent nausea (nausea that does not stop) please call the [facility] let us [place] the amount of nausea that you are experiencing.  If you begin to vomit, you need to call the [facility] if it is the weekend and you have vomited more than one time and can't get it to stop-go to the Emergency Room.  Persistent nausea/vomiting can lead to dehydration (loss of fluid in your body) and will make you feel terrible.   Ice chips, sips of clear liquids, foods that are @ room temperature, crackers, and toast tend to be better tolerated.   SYMPTOMS TO REPORT AS SOON AS POSSIBLE AFTER TREATMENT:   FEVER GREATER THAN 100.5 F  CHILLS WITH OR WITHOUT FEVER  NAUSEA AND VOMITING THAT IS NOT CONTROLLED WITH YOUR NAUSEA MEDICATION  UNUSUAL SHORTNESS OF BREATH  UNUSUAL BRUISING OR BLEEDING  TENDERNESS IN MOUTH AND THROAT WITH OR WITHOUT PRESENCE OF ULCERS  URINARY PROBLEMS  BOWEL PROBLEMS  UNUSUAL RASH      Wear comfortable clothing and clothing appropriate for easy access to any Portacath or PICC line. Let us [place] if there is anything that we can do to make your therapy better!    What to do if you need assistance after hours or  on the weekends: CALL [phone] on the line, do not hang up.  You will hear multiple messages but at the end you will be connected with a nurse triage line.  They will contact the doctor if necessary.  Most of the time they will be able to assist you.  Do not call the hospital operator.      I have been informed and understand all of the instructions given to me and have received a copy. I have been instructed to call the clinic ([phone] my family physician as soon as possible for continued medical care, if indicated. I do not have any more questions at this time but understand that I may call the [facility] the Patient Navigator at ([phone] office hours should I  have questions or need assistance in obtaining follow-up care.

## 2018-06-16 NOTE — Discharge Instructions (Addendum)
Bone Marrow Aspiration and Bone Marrow Biopsy, Adult, Care After This sheet gives you information about how to care for yourself after your procedure. Your health care provider may also give you more specific instructions. If you have problems or questions, contact your health care provider. What can I expect after the procedure? After the procedure, it is common to have:  Mild pain and tenderness.  Swelling.  Bruising. Follow these instructions at home: Puncture site care      Follow instructions from your health care provider about how to take care of the puncture site. Make sure you: ? Wash your hands with soap and water before you change your bandage (dressing). If soap and water are not available, use hand sanitizer. ? Change your dressing as told by your health care provider. May remove dressing in 24 hours (12 noon 06/17/2018) and bathe or shower. Replace with bandaid as necessary.  Keep site clean and dry.  Check your puncture siteevery day for signs of infection. Check for: ? More redness, swelling, or pain. ? More fluid or blood. ? Warmth. ? Pus or a bad smell. General instructions  Take over-the-counter and prescription medicines only as told by your health care provider.  Do not take baths, swim, or use a hot tub until your health care provider approves. Ask if you can take a shower or have a sponge bath.  Return to your normal activities as told by your health care provider. Ask your health care provider what activities are safe for you.  Do not drive for 24 hours if you were given a medicine to help you relax (sedative) during your procedure.  Keep all follow-up visits as told by your health care provider. This is important. Contact a health care provider if:  Your pain is not controlled with medicine. Get help right away if:  You have a fever.  You have more redness, swelling, or pain around the puncture site.  You have more fluid or blood coming from the  puncture site.  Your puncture site feels warm to the touch.  You have pus or a bad smell coming from the puncture site. These symptoms may represent a serious problem that is an emergency. Do not wait to see if the symptoms will go away. Get medical help right away. Call your local emergency services (911 in the [place] Do not drive yourself to the hospital. Summary  After the procedure, it is common to have mild pain, tenderness, swelling, and bruising.  Follow instructions from your health care provider about how to take care of the puncture site.  Get help right away if you have any symptoms of infection or if you have more blood or fluid coming from the puncture site. This information is not intended to replace advice given to you by your health care provider. Make sure you discuss any questions you have with your health care provider. Document Released: 10/04/2004 Document Revised: 06/30/2017 Document Reviewed: 08/29/2015 Elsevier Interactive Patient Education  2019 [employer]. Moderate Conscious Sedation, Adult, Care After These instructions provide you with information about caring for yourself after your procedure. Your health care provider may also give you more specific instructions. Your treatment has been planned according to current medical practices, but problems sometimes occur. Call your health care provider if you have any problems or questions after your procedure. What can I expect after the procedure? After your procedure, it is common:  To feel sleepy for several hours.  To feel clumsy and have  poor balance for several hours.  To have poor judgment for several hours.  To vomit if you eat too soon. Follow these instructions at home: For at least 24 hours after the procedure:   Do not: ? Participate in activities where you could fall or become injured. ? Drive. ? Use heavy machinery. ? Drink alcohol. ? Take sleeping pills or medicines that cause  drowsiness. ? Make important decisions or sign legal documents. ? Take care of children on your own.  Rest. Eating and drinking  Follow the diet recommended by your health care provider.  If you vomit: ? Drink water, juice, or soup when you can drink without vomiting. ? Make sure you have little or no nausea before eating solid foods. General instructions  Have a responsible adult stay with you until you are awake and alert.  Take over-the-counter and prescription medicines only as told by your health care provider.  If you smoke, do not smoke without supervision.  Keep all follow-up visits as told by your health care provider. This is important. Contact a health care provider if:  You keep feeling nauseous or you keep vomiting.  You feel light-headed.  You develop a rash.  You have a fever. Get help right away if:  You have trouble breathing. This information is not intended to replace advice given to you by your health care provider. Make sure you discuss any questions you have with your health care provider. Document Released: 01/05/2013 Document Revised: 08/20/2015 Document Reviewed: 07/07/2015 Elsevier Interactive Patient Education  2019 [employer].

## 2018-06-16 NOTE — Progress Notes (Signed)
[facility] ROUNDING NOTE   Subjective:   This is a 58 year old gentleman who presented with hypercalcemia and acute kidney injury.  His serum creatinine on presentation was 6.14 with a calcium of 13.1 he was given IV normal saline and Lasix for a saline diuresis.  The underlying condition of his acute kidney injury appears to be a paraproteinemia.  I appreciate oncology's assistance.  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  Patient undergoing bone marrow biopsy appreciate assistance from Dr. Delton Coombes.  Blood pressure 139/80 pulse 91 temperature 98.2 O2 sats 97% room air  Urine output 2500 cc 06/15/2018  Calcitonin 4 units/kg subcu twice daily Lasix 40 mg every 6 hours sodium bicarbonate 1.3 g 3 times daily sodium chloride infusion 235 cc/h, folic acid 1 mg daily  Sodium 134 potassium 4.3 chloride 99 CO2 21 BUN 57 creatinine 4.5 glucose 98 calcium 12.0 phosphorus 6.2 albumin 3.7 WBC 11.0 hemoglobin 9.4 platelets 158         T sats 20%   X-ray of bone survey shows abnormal lucent changes identified in the lumbar spine.  Sclerotic focus in the inferior aspect of the left iliac joint.  Bilateral small pleural effusions.  MRI lumbar spine limited with no osseous injury of the lumbar spine.  Chest CT revealed bilateral pleural effusions multiple 2 to 3 mm pulmonary nodules aortic atherosclerosis and diffuse bronchial wall thickening.   Objective:  Vital signs in last 24 hours:  Temp:  [98 F (36.7 C)-98.5 F (36.9 C)] 98.2 F (36.8 C) (03/18 0910) Pulse Rate:  [91-111] 91 (03/18 0910) Resp:  [16-18] 16 (03/18 0910) BP: (110-139)/(56-96) 139/80 (03/18 0910) SpO2:  [93 %-97 %] 97 % (03/18 0910)  Weight change:  Filed Weights   06/12/18 1259 06/12/18 2221  Weight: 79 kg 77.1 kg    Intake/Output: I/O last 3 completed shifts: In: 240 [P.O.:240] Out: 3400 [Urine:3400]   Intake/Output this shift:  No intake/output data  recorded.  CVS- RRR no murmurs rubs or gallops RS- CTA no wheezes or rales ABD- BS present soft non-distended EXT- no edema   Basic Metabolic Panel: Recent Labs  Lab 06/12/18 2240 06/13/18 0639 06/14/18 0548 06/15/18 0534 06/16/18 0503  NA 131* 134* 135 134* 134*  K 4.4 4.5 4.9 4.6 4.3  CL 102 105 108 101 99  CO2 16* 16* 16* 18* 21*  GLUCOSE 99 83 82 80 98  BUN 85* 85* 71* 63* 57*  CREATININE 5.69* 5.48* 5.14* 4.92* 4.50*  CALCIUM 12.2*  12.3* 12.3* 12.2* 12.9* 12.0*  PHOS  --   --   --  6.0* 6.2*    Liver Function Tests: Recent Labs  Lab 06/12/18 1325 06/15/18 0534 06/16/18 0503  AST 20 22  --   ALT 23 19  --   ALKPHOS 99 82  --   BILITOT 0.6 0.3  --   PROT 7.8 7.1  --   ALBUMIN 4.4 3.8  3.8 3.7   Recent Labs  Lab 06/12/18 1325  LIPASE 33   No results for input(s): AMMONIA in the last 168 hours.  CBC: Recent Labs  Lab 06/12/18 1325 06/12/18 2240 06/13/18 0639 06/14/18 0548 06/15/18 0534 06/16/18 0503  WBC 10.6*  --  9.6 10.7* 12.0* 11.0*  NEUTROABS  --  5.4 3.6 4.2 3.9 4.3  HGB 8.3*  --  7.3* 7.0* 9.8* 9.4*  HCT 25.6*  --  22.9* 22.5* 29.7* 28.7*  MCV 107.6*  --  111.2*  111.4* 100.3* 101.1*  PLT 164  --  150 165 150 158    Cardiac Enzymes: Recent Labs  Lab 06/14/18 0949  CKTOTAL 50    BNP: Invalid input(s): POCBNP  CBG: No results for input(s): GLUCAP in the last 168 hours.  Microbiology: Results for orders placed or performed during the hospital encounter of 06/12/18  Culture, blood (routine x 2) Call MD if unable to obtain prior to antibiotics being given     Status: None (Preliminary result)   Collection Time: 06/12/18 10:41 PM  Result Value Ref Range Status   Specimen Description BLOOD RIGHT HAND  Final   Special Requests   Final    BOTTLES DRAWN AEROBIC AND ANAEROBIC Blood Culture adequate volume   Culture   Final    NO GROWTH 4 DAYS Performed at [facility], [address], [place], [place] [address]    Report Status  PENDING  Incomplete  Respiratory Panel by PCR     Status: Abnormal   Collection Time: 06/12/18 10:44 PM  Result Value Ref Range Status   Adenovirus NOT DETECTED NOT DETECTED Final   Coronavirus 229E NOT DETECTED NOT DETECTED Final    Comment: (NOTE) The Coronavirus on the Respiratory Panel, DOES NOT test for the novel  Coronavirus (2019 nCoV)    Coronavirus HKU1 NOT DETECTED NOT DETECTED Final   Coronavirus NL63 NOT DETECTED NOT DETECTED Final   Coronavirus OC43 NOT DETECTED NOT DETECTED Final   Metapneumovirus NOT DETECTED NOT DETECTED Final   Rhinovirus / Enterovirus DETECTED (A) NOT DETECTED Final   Influenza A NOT DETECTED NOT DETECTED Final   Influenza B NOT DETECTED NOT DETECTED Final   Parainfluenza Virus 1 NOT DETECTED NOT DETECTED Final   Parainfluenza Virus 2 NOT DETECTED NOT DETECTED Final   Parainfluenza Virus 3 NOT DETECTED NOT DETECTED Final   Parainfluenza Virus 4 NOT DETECTED NOT DETECTED Final   Respiratory Syncytial Virus NOT DETECTED NOT DETECTED Final   Bordetella pertussis NOT DETECTED NOT DETECTED Final   Chlamydophila pneumoniae NOT DETECTED NOT DETECTED Final   Mycoplasma pneumoniae NOT DETECTED NOT DETECTED Final    Comment: Performed at [facility], [place] [address], [place], [place] [address]  Culture, blood (routine x 2) Call MD if unable to obtain prior to antibiotics being given     Status: None (Preliminary result)   Collection Time: 06/12/18 10:58 PM  Result Value Ref Range Status   Specimen Description BLOOD LEFT ARM  Final   Special Requests   Final    BOTTLES DRAWN AEROBIC AND ANAEROBIC Blood Culture adequate volume   Culture   Final    NO GROWTH 4 DAYS Performed at [facility], [address], [place], [place] [address]    Report Status PENDING  Incomplete  MRSA PCR Screening     Status: None   Collection Time: 06/13/18 12:20 AM  Result Value Ref Range Status   MRSA by PCR NEGATIVE NEGATIVE Final    Comment:        The GeneXpert MRSA  Assay (FDA approved for NASAL specimens only), is one component of a comprehensive MRSA colonization surveillance program. It is not intended to diagnose MRSA infection nor to guide or monitor treatment for MRSA infections. Performed at [facility], [address] [place], [place] [address]     Coagulation Studies: Recent Labs    06/15/18 1153  LABPROT 12.7  INR 1.0    Urinalysis: No results for input(s): COLORURINE, LABSPEC, [place], GLUCOSEU, HGBUR,  BILIRUBINUR, KETONESUR, PROTEINUR, UROBILINOGEN, NITRITE, LEUKOCYTESUR in the last 72 hours.  Invalid input(s): APPERANCEUR    Imaging: Ct Chest Wo Contrast  Result Date: 06/14/2018 CLINICAL DATA:  58 year old male with history of chest pain and pleural effusion. EXAM: CT CHEST WITHOUT CONTRAST TECHNIQUE: Multidetector CT imaging of the chest was performed following the standard protocol without IV contrast. COMPARISON:  Chest CT 05/20/2016.  Chest x-ray 06/12/2018. FINDINGS: Cardiovascular: Heart size is normal. There is no significant pericardial fluid, thickening or pericardial calcification. Aortic atherosclerosis. No coronary artery calcifications. Mediastinum/Nodes: No pathologically enlarged mediastinal or hilar lymph nodes. Esophagus is unremarkable in appearance. No axillary lymphadenopathy. Lungs/Pleura: Multiple tiny 2-3 mm pulmonary nodules scattered throughout both lungs, several of which are calcified, compatible with benign granulomas. No larger more suspicious appearing pulmonary nodules or masses are noted. Moderate-sized bilateral pleural effusions lying dependently. This is associated with some mild passive atelectasis in the lower lobes of the lungs bilaterally. No acute consolidative airspace disease. Diffuse bronchial wall thickening with mild centrilobular and paraseptal emphysema. Upper Abdomen: Aortic atherosclerosis. Musculoskeletal: There are no aggressive appearing lytic or blastic lesions noted in the  visualized portions of the skeleton. IMPRESSION: 1. Moderate bilateral pleural effusions lying dependently with areas of passive subsegmental atelectasis in the lower lobes of the lungs bilaterally. 2. Multiple tiny 2-3 mm pulmonary nodules scattered throughout the lungs bilaterally, nonspecific, but similar to prior study from 05/28/2016, considered definitively benign. 3. Aortic atherosclerosis. 4. Diffuse bronchial wall thickening with mild centrilobular and paraseptal emphysema; imaging findings suggestive of underlying COPD. Aortic Atherosclerosis (ICD10-I70.0) and Emphysema (ICD10-J43.9). Electronically Signed   By: Vinnie Langton M.D.   On: 06/14/2018 10:53   Mr Thoracic Spine Wo Contrast  Result Date: 06/14/2018 CLINICAL DATA:  Mid low back pain for 2 months EXAM: MRI THORACIC AND LUMBAR SPINE WITHOUT CONTRAST TECHNIQUE: Multiplanar and multiecho pulse sequences of the thoracic and lumbar spine were obtained without intravenous contrast. COMPARISON:  Lumbar spine MRI 05/06/2018 FINDINGS: Severe patient motion degrading image quality limiting evaluation. Portion of the examination are nondiagnostic. MRI THORACIC SPINE FINDINGS Alignment:  Physiologic. Vertebrae: No fracture, evidence of discitis, or bone lesion. Cord:  Normal signal and morphology. Paraspinal and other soft tissues: No acute paraspinal abnormality. Disc levels: Disc spaces:  Disc spaces are relatively preserved. C6-7: Mild broad-based disc bulge. No foraminal or central canal stenosis. T1-T2: No disc protrusion, foraminal stenosis or central canal stenosis. T2-T3: No disc protrusion, foraminal stenosis or central canal stenosis. T3-T4: Broad-based disc bulge. Moderate bilateral facet arthropathy. Mild spinal stenosis. T4-T5: No disc protrusion, foraminal stenosis or central canal stenosis. Bilateral mild facet arthropathy. T5-T6: No disc protrusion, foraminal stenosis or central canal stenosis. T6-T7: No disc protrusion, foraminal  stenosis or central canal stenosis. T7-T8: No disc protrusion, foraminal stenosis or central canal stenosis. T8-T9: No disc protrusion, foraminal stenosis or central canal stenosis. T9-T10: No disc protrusion, foraminal stenosis or central canal stenosis. T10-T11: No disc protrusion, foraminal stenosis or central canal stenosis. T11-T12: No disc protrusion, foraminal stenosis or central canal stenosis. MRI LUMBAR SPINE FINDINGS Segmentation:  Standard. Alignment:  Physiologic. Vertebrae: No fracture, evidence of discitis, or aggressive bone lesion. Low marrow signal throughout the lumbar spine which is limited in evaluation secondary to patient motion. Conus medullaris and cauda equina: Conus extends to the T12 level. Conus and cauda equina appear normal. Paraspinal and other soft tissues: No acute paraspinal abnormality. Disc levels: Disc spaces: Posterior lumbar interbody fusion from L1 through L4. T12-L1: Minimal broad-based disc bulge. Mild  bilateral facet arthropathy. No evidence of neural foraminal stenosis. No central canal stenosis. L1-L2: Interbody fusion. No evidence of neural foraminal stenosis. No central canal stenosis. L2-L3: Interbody fusion. Evaluation is extremely limited secondary to patient motion degrading image quality. There is suggestion of persistent right central/right paracentral epidural soft tissue, but characterization is extremely difficult for given the degree of patient motion. Right foraminal stenosis. No central canal stenosis. L3-L4: Interbody fusion. No evidence of neural foraminal stenosis. No central canal stenosis. L4-L5: Minimal broad-based disc bulge. Mild bilateral facet scratch them moderate bilateral facet arthropathy. Mild left foraminal stenosis. No right foraminal stenosis. No central canal stenosis. L5-S1: Broad-based disc bulge. Moderate bilateral facet arthropathy. Mild left foraminal stenosis. No significant right foraminal stenosis. No central canal stenosis.  IMPRESSION: Severe patient motion degrading image quality limiting evaluation. Portion of the examination are nondiagnostic. MR THORACIC SPINE IMPRESSION 1. No aggressive osseous lesion to suggest malignancy. 2.  No acute osseous injury of the thoracic spine. MR LUMBAR SPINE IMPRESSION 1. L2-3 is extremely limited in evaluation secondary to patient motion degrading image quality. There is suggestion of persistent right central/right paracentral epidural soft tissue, but characterization is extremely difficult for given the degree of patient motion. Right foraminal stenosis. Recommend repeat MRI of the lumbar spine when the patient is able to better tolerate the examination with less patient motion. 2.  No acute osseous injury of the lumbar spine. Electronically Signed   By: Kathreen Devoid   On: 06/14/2018 19:51   Mr Lumbar Spine Wo Contrast  Result Date: 06/14/2018 CLINICAL DATA:  Mid low back pain for 2 months EXAM: MRI THORACIC AND LUMBAR SPINE WITHOUT CONTRAST TECHNIQUE: Multiplanar and multiecho pulse sequences of the thoracic and lumbar spine were obtained without intravenous contrast. COMPARISON:  Lumbar spine MRI 05/06/2018 FINDINGS: Severe patient motion degrading image quality limiting evaluation. Portion of the examination are nondiagnostic. MRI THORACIC SPINE FINDINGS Alignment:  Physiologic. Vertebrae: No fracture, evidence of discitis, or bone lesion. Cord:  Normal signal and morphology. Paraspinal and other soft tissues: No acute paraspinal abnormality. Disc levels: Disc spaces:  Disc spaces are relatively preserved. C6-7: Mild broad-based disc bulge. No foraminal or central canal stenosis. T1-T2: No disc protrusion, foraminal stenosis or central canal stenosis. T2-T3: No disc protrusion, foraminal stenosis or central canal stenosis. T3-T4: Broad-based disc bulge. Moderate bilateral facet arthropathy. Mild spinal stenosis. T4-T5: No disc protrusion, foraminal stenosis or central canal stenosis.  Bilateral mild facet arthropathy. T5-T6: No disc protrusion, foraminal stenosis or central canal stenosis. T6-T7: No disc protrusion, foraminal stenosis or central canal stenosis. T7-T8: No disc protrusion, foraminal stenosis or central canal stenosis. T8-T9: No disc protrusion, foraminal stenosis or central canal stenosis. T9-T10: No disc protrusion, foraminal stenosis or central canal stenosis. T10-T11: No disc protrusion, foraminal stenosis or central canal stenosis. T11-T12: No disc protrusion, foraminal stenosis or central canal stenosis. MRI LUMBAR SPINE FINDINGS Segmentation:  Standard. Alignment:  Physiologic. Vertebrae: No fracture, evidence of discitis, or aggressive bone lesion. Low marrow signal throughout the lumbar spine which is limited in evaluation secondary to patient motion. Conus medullaris and cauda equina: Conus extends to the T12 level. Conus and cauda equina appear normal. Paraspinal and other soft tissues: No acute paraspinal abnormality. Disc levels: Disc spaces: Posterior lumbar interbody fusion from L1 through L4. T12-L1: Minimal broad-based disc bulge. Mild bilateral facet arthropathy. No evidence of neural foraminal stenosis. No central canal stenosis. L1-L2: Interbody fusion. No evidence of neural foraminal stenosis. No central canal stenosis. L2-L3: Interbody fusion.  Evaluation is extremely limited secondary to patient motion degrading image quality. There is suggestion of persistent right central/right paracentral epidural soft tissue, but characterization is extremely difficult for given the degree of patient motion. Right foraminal stenosis. No central canal stenosis. L3-L4: Interbody fusion. No evidence of neural foraminal stenosis. No central canal stenosis. L4-L5: Minimal broad-based disc bulge. Mild bilateral facet scratch them moderate bilateral facet arthropathy. Mild left foraminal stenosis. No right foraminal stenosis. No central canal stenosis. L5-S1: Broad-based disc  bulge. Moderate bilateral facet arthropathy. Mild left foraminal stenosis. No significant right foraminal stenosis. No central canal stenosis. IMPRESSION: Severe patient motion degrading image quality limiting evaluation. Portion of the examination are nondiagnostic. MR THORACIC SPINE IMPRESSION 1. No aggressive osseous lesion to suggest malignancy. 2.  No acute osseous injury of the thoracic spine. MR LUMBAR SPINE IMPRESSION 1. L2-3 is extremely limited in evaluation secondary to patient motion degrading image quality. There is suggestion of persistent right central/right paracentral epidural soft tissue, but characterization is extremely difficult for given the degree of patient motion. Right foraminal stenosis. Recommend repeat MRI of the lumbar spine when the patient is able to better tolerate the examination with less patient motion. 2.  No acute osseous injury of the lumbar spine. Electronically Signed   By: Kathreen Devoid   On: 06/14/2018 19:51   Dg Bone Survey Met  Result Date: 06/15/2018 CLINICAL DATA:  58 year old male with a history of hypercalcemia EXAM: METASTATIC BONE SURVEY COMPARISON:  Chest CT 06/14/2018, plain film 06/12/2018, lumbar CT 05/06/2018 FINDINGS: Skull: No acute fracture. No lytic or sclerotic lesion identified.  Endodontal disease. Cervical spine: Surgical changes of anterior cervical discectomy infusion with anterior plate screw fixation [id number] with no complicating features. Degenerative changes at the levels above and below. No lytic lesions or sclerotic lesions.  Alignment maintained. Thoracic spine: Vertebral body heights maintained with alignment. Mild disc disease of the thoracic spine. No acute fracture. No lytic or sclerotic lesions. Unremarkable appearance of the pedicles. Lumbar spine: Surgical changes of posterior lumbar interbody fusion with bilateral pedicle screw and rod fixation spanning L1-L4. No complicating features. No acute fracture. The lytic changes that were  identified on prior CT are not visualized on the current plain film. Moderate disc disease and facet disease of the lumbar spine. No lytic or sclerotic lesions identified. Chest: Cardiomediastinal silhouette within normal limits. Blunting of the bilateral costophrenic angles with obscuration of the hemidiaphragms bilaterally. No interlobular septal thickening. No rib fracture identified. Pelvis: No acute fracture. Sclerotic changes at the inferior aspect of the left sacroiliac joint on the iliac aspect, similar to prior CT abdomen. No lytic changes. Degenerative changes of the hips. Right upper extremity: No acute fracture.  No lucent lesion or sclerotic focus. Left upper extremity: No acute fracture.  No lucent lesion or sclerotic changes. Right lower extremity: No acute fracture.  No no lucent lesion or sclerotic lesion. Left lower extremity: No acute fracture.  No lucent lesion or sclerotic lesion. IMPRESSION: Abnormal lucent changes identified in the lumbar spine on the prior CT dated 05/06/2018 are not visualized on the current plain film. If concern for malignancy, correlation with either nuclear medicine bone scan or PET-CT may be useful. Sclerotic focus at the inferior aspect of the left sacral iliac joint, present on prior CT pelvis. If concern for malignancy, correlation with either nuclear medicine bone scan or PET-CT may be useful. Bilateral small pleural effusions, present on prior CT. Electronically Signed   By: Corrie Mckusick D.O.  On: 06/15/2018 09:22     Medications:   . sodium chloride 125 mL/hr at 06/16/18 0803   . calcitonin  4 Units/kg Subcutaneous BID  . feeding supplement (ENSURE ENLIVE)  237 mL Oral BID BM  . folic acid  1 mg Oral Daily  . furosemide  40 mg Intravenous Q6H  . lidocaine  1 patch Transdermal Q24H  . sodium bicarbonate  1,300 mg Oral TID   HYDROcodone-acetaminophen  Assessment/ Plan:   Acute kidney injury.  Elevated protein creatinine ratio.  I think this  picture fits with multiple myeloma.  Myeloma kidney.  I do not see the need for renal biopsy unless oncology feels strongly prior to initiating therapy.  I think a bone marrow biopsy would be important to establish severity of disease and treatment options.  Continue to avoid nonsteroidal inflammatories ACE inhibitors ARB use and IV contrast.  Serum creatinine appears to be slowly improving  Hypercalcemia PTH appropriately suppressed continue same diuresis.  Calcium improving slowly.  Continue to follow  Anemia iron studies in normal range.  Will defer to oncology.  Patient to receive folic acid  Hyponatremia slightly improved  Metabolic acidosis will decrease sodium bicarbonate 650 mg twice daily   LOS: 4 Sherril Croon @TODAY @10 :24 AM

## 2018-06-16 NOTE — Progress Notes (Signed)
Transported this morning by [facility] to [facility] for bone biospy of left posterior iliac.  Returned around 1345.  Dressing to left lower back dry and intact.  Received Vicodin for pain rated a 10.  Resumed heart healthy diet but declined to eat.  Daughter at bedside.

## 2018-06-16 NOTE — Consult Note (Signed)
[facility] Oncology Progress Note  Name: [name]      MRN: [id number]    Location: Z009/Q330-07  Date: 06/16/2018 Time:4:40 PM   Subjective: Interval History:[name] is seen in follow-up visit today.  He had bone marrow biopsy done today.  He continues to have low back pain.  He has not been eating well and complains of dry mouth.  He is able to walk to the bathroom back-and-forth without any problems.  However he reports that he had developed some weakness mainly in the right leg and some in the left leg in the last few weeks.  He does not report any falls.  Objective: Vital signs in last 24 hours: Temp:  [98 F (36.7 C)-98.3 F (36.8 C)] 98.2 F (36.8 C) (03/18 1355) Pulse Rate:  [85-111] 90 (03/18 1355) Resp:  [14-18] 17 (03/18 1355) BP: (108-139)/(53-110) 108/53 (03/18 1355) SpO2:  [91 %-97 %] 96 % (03/18 1355)    Intake/Output from previous day: 03/17 0800 - 03/18 0759 In: 240 [P.O.:240] Out: 1800 [Urine:1800]    Intake/Output this shift: Total I/O In: 100 [P.O.:100] Out: 1275 [Urine:1275]   PHYSICAL EXAM: BP 122/70 (BP Location: Right Arm)   Pulse (!) 103   Temp 98.3 F (36.8 C) (Oral)   Resp 16   Ht 6' (1.829 m)   Wt 170 lb (77.1 kg)   SpO2 97%   BMI 23.06 kg/m  General appearance: alert, cooperative and appears stated age Lungs: clear to auscultation bilaterally Heart: regular rate and rhythm Abdomen: soft, non-tender; bowel sounds normal; no masses,  no organomegaly Extremities: extremities normal, atraumatic, no cyanosis or edema Skin: Skin color, texture, turgor normal. No rashes or lesions Lymph nodes: Cervical, supraclavicular, and axillary nodes normal. Neurologic: Grossly normal Muscle strength is 3-4 out of 5 in both lower extremities.  Studies/Results: Results for orders placed or performed during the hospital encounter of 06/12/18 (from the past 48 hour(s))  Renal function panel     Status: Abnormal   Collection Time:  06/15/18  5:34 AM  Result Value Ref Range   Sodium 134 (L) 135 - 145 mmol/L   Potassium 4.6 3.5 - 5.1 mmol/L   Chloride 101 98 - 111 mmol/L   CO2 18 (L) 22 - 32 mmol/L   Glucose, Bld 80 70 - 99 mg/dL   BUN 63 (H) 6 - 20 mg/dL   Creatinine, Ser 4.92 (H) 0.61 - 1.24 mg/dL   Calcium 12.9 (H) 8.9 - 10.3 mg/dL   Phosphorus 6.0 (H) 2.5 - 4.6 mg/dL   Albumin 3.8 3.5 - 5.0 g/dL   GFR calc non Af Amer 12 (L) >60 mL/min   GFR calc Af Amer 14 (L) >60 mL/min   Anion gap 15 5 - 15    Comment: Performed at [facility], [address], [place], [place] [address]  Hepatic function panel     Status: None   Collection Time: 06/15/18  5:34 AM  Result Value Ref Range   Total Protein 7.1 6.5 - 8.1 g/dL   Albumin 3.8 3.5 - 5.0 g/dL   AST 22 15 - 41 U/L   ALT 19 0 - 44 U/L   Alkaline Phosphatase 82 38 - 126 U/L   Total Bilirubin 0.3 0.3 - 1.2 mg/dL   Bilirubin, Direct <0.1 0.0 - 0.2 mg/dL   Indirect Bilirubin NOT CALCULATED 0.3 - 0.9 mg/dL    Comment: Performed at [facility], [address], [place],  [address]  CBC with  Differential/Platelet     Status: Abnormal   Collection Time: 06/15/18  5:34 AM  Result Value Ref Range   WBC 12.0 (H) 4.0 - 10.5 K/uL   RBC 2.96 (L) 4.22 - 5.81 MIL/uL   Hemoglobin 9.8 (L) 13.0 - 17.0 g/dL    Comment: REPEATED TO VERIFY POST TRANSFUSION SPECIMEN    HCT 29.7 (L) 39.0 - 52.0 %   MCV 100.3 (H) 80.0 - 100.0 fL    Comment: DELTA CHECK NOTED   MCH 33.1 26.0 - 34.0 pg   MCHC 33.0 30.0 - 36.0 g/dL   RDW 20.1 (H) 11.5 - 15.5 %   Platelets 150 150 - 400 K/uL   nRBC 2.5 (H) 0.0 - 0.2 %   Neutrophils Relative % 33 %   Neutro Abs 3.9 1.7 - 7.7 K/uL   Lymphocytes Relative 40 %   Lymphs Abs 4.9 (H) 0.7 - 4.0 K/uL   Monocytes Relative 22 %   Monocytes Absolute 2.7 (H) 0.1 - 1.0 K/uL   Eosinophils Relative 1 %   Eosinophils Absolute 0.1 0.0 - 0.5 K/uL   Basophils Relative 1 %   Basophils Absolute 0.1 0.0 - 0.1 K/uL   Immature Granulocytes 3 %   Abs Immature  Granulocytes 0.31 (H) 0.00 - 0.07 K/uL   Abnormal Lymphocytes Present PRESENT     Comment: Performed at [facility], [address] [place], [place] [address]  Kappa/lambda light chains     Status: Abnormal   Collection Time: 06/15/18  5:34 AM  Result Value Ref Range   Kappa free light chain 12,085.4 (H) 3.3 - 19.4 mg/L   Lamda free light chains 6.1 5.7 - 26.3 mg/L   Kappa, lamda light chain ratio 1,981.21 (H) 0.26 - 1.65    Comment: (NOTE) Performed At: [facility] [place], [place] [id number] Rush Farmer MD UM:[id number]   Protime-INR     Status: None   Collection Time: 06/15/18 11:53 AM  Result Value Ref Range   Prothrombin Time 12.7 11.4 - 15.2 seconds   INR 1.0 0.8 - 1.2    Comment: (NOTE) INR goal varies based on device and disease states. Performed at [facility], [address], [place], [place] [address]   Renal function panel     Status: Abnormal   Collection Time: 06/16/18  5:03 AM  Result Value Ref Range   Sodium 134 (L) 135 - 145 mmol/L   Potassium 4.3 3.5 - 5.1 mmol/L   Chloride 99 98 - 111 mmol/L   CO2 21 (L) 22 - 32 mmol/L   Glucose, Bld 98 70 - 99 mg/dL   BUN 57 (H) 6 - 20 mg/dL   Creatinine, Ser 4.50 (H) 0.61 - 1.24 mg/dL   Calcium 12.0 (H) 8.9 - 10.3 mg/dL   Phosphorus 6.2 (H) 2.5 - 4.6 mg/dL   Albumin 3.7 3.5 - 5.0 g/dL   GFR calc non Af Amer 13 (L) >60 mL/min   GFR calc Af Amer 15 (L) >60 mL/min   Anion gap 14 5 - 15    Comment: Performed at [facility], [address] [place], [place] [address]  CBC with Differential/Platelet     Status: Abnormal   Collection Time: 06/16/18  5:03 AM  Result Value Ref Range   WBC 11.0 (H) 4.0 - 10.5 K/uL   RBC 2.84 (L) 4.22 - 5.81 MIL/uL   Hemoglobin 9.4 (L) 13.0 - 17.0 g/dL   HCT 28.7 (L) 39.0 - 52.0 %  MCV 101.1 (H) 80.0 - 100.0 fL   MCH 33.1 26.0 - 34.0 pg   MCHC 32.8 30.0 - 36.0 g/dL   RDW 19.6 (H) 11.5 - 15.5 %   Platelets 158 150 - 400 K/uL   nRBC 2.3 (H) 0.0 - 0.2 %    Neutrophils Relative % 39 %   Neutro Abs 4.3 1.7 - 7.7 K/uL   Lymphocytes Relative 39 %   Lymphs Abs 4.4 (H) 0.7 - 4.0 K/uL   Monocytes Relative 17 %   Monocytes Absolute 1.8 (H) 0.1 - 1.0 K/uL   Eosinophils Relative 1 %   Eosinophils Absolute 0.1 0.0 - 0.5 K/uL   Basophils Relative 1 %   Basophils Absolute 0.1 0.0 - 0.1 K/uL   Immature Granulocytes 3 %   Abs Immature Granulocytes 0.31 (H) 0.00 - 0.07 K/uL    Comment: Performed at [facility], [address], [place], [place] [address]   Mr Thoracic Spine Wo Contrast  Result Date: 06/14/2018 CLINICAL DATA:  Mid low back pain for 2 months EXAM: MRI THORACIC AND LUMBAR SPINE WITHOUT CONTRAST TECHNIQUE: Multiplanar and multiecho pulse sequences of the thoracic and lumbar spine were obtained without intravenous contrast. COMPARISON:  Lumbar spine MRI 05/06/2018 FINDINGS: Severe patient motion degrading image quality limiting evaluation. Portion of the examination are nondiagnostic. MRI THORACIC SPINE FINDINGS Alignment:  Physiologic. Vertebrae: No fracture, evidence of discitis, or bone lesion. Cord:  Normal signal and morphology. Paraspinal and other soft tissues: No acute paraspinal abnormality. Disc levels: Disc spaces:  Disc spaces are relatively preserved. C6-7: Mild broad-based disc bulge. No foraminal or central canal stenosis. T1-T2: No disc protrusion, foraminal stenosis or central canal stenosis. T2-T3: No disc protrusion, foraminal stenosis or central canal stenosis. T3-T4: Broad-based disc bulge. Moderate bilateral facet arthropathy. Mild spinal stenosis. T4-T5: No disc protrusion, foraminal stenosis or central canal stenosis. Bilateral mild facet arthropathy. T5-T6: No disc protrusion, foraminal stenosis or central canal stenosis. T6-T7: No disc protrusion, foraminal stenosis or central canal stenosis. T7-T8: No disc protrusion, foraminal stenosis or central canal stenosis. T8-T9: No disc protrusion, foraminal stenosis or central canal  stenosis. T9-T10: No disc protrusion, foraminal stenosis or central canal stenosis. T10-T11: No disc protrusion, foraminal stenosis or central canal stenosis. T11-T12: No disc protrusion, foraminal stenosis or central canal stenosis. MRI LUMBAR SPINE FINDINGS Segmentation:  Standard. Alignment:  Physiologic. Vertebrae: No fracture, evidence of discitis, or aggressive bone lesion. Low marrow signal throughout the lumbar spine which is limited in evaluation secondary to patient motion. Conus medullaris and cauda equina: Conus extends to the T12 level. Conus and cauda equina appear normal. Paraspinal and other soft tissues: No acute paraspinal abnormality. Disc levels: Disc spaces: Posterior lumbar interbody fusion from L1 through L4. T12-L1: Minimal broad-based disc bulge. Mild bilateral facet arthropathy. No evidence of neural foraminal stenosis. No central canal stenosis. L1-L2: Interbody fusion. No evidence of neural foraminal stenosis. No central canal stenosis. L2-L3: Interbody fusion. Evaluation is extremely limited secondary to patient motion degrading image quality. There is suggestion of persistent right central/right paracentral epidural soft tissue, but characterization is extremely difficult for given the degree of patient motion. Right foraminal stenosis. No central canal stenosis. L3-L4: Interbody fusion. No evidence of neural foraminal stenosis. No central canal stenosis. L4-L5: Minimal broad-based disc bulge. Mild bilateral facet scratch them moderate bilateral facet arthropathy. Mild left foraminal stenosis. No right foraminal stenosis. No central canal stenosis. L5-S1: Broad-based disc bulge. Moderate bilateral facet arthropathy. Mild left foraminal stenosis. No significant right foraminal  stenosis. No central canal stenosis. IMPRESSION: Severe patient motion degrading image quality limiting evaluation. Portion of the examination are nondiagnostic. MR THORACIC SPINE IMPRESSION 1. No aggressive  osseous lesion to suggest malignancy. 2.  No acute osseous injury of the thoracic spine. MR LUMBAR SPINE IMPRESSION 1. L2-3 is extremely limited in evaluation secondary to patient motion degrading image quality. There is suggestion of persistent right central/right paracentral epidural soft tissue, but characterization is extremely difficult for given the degree of patient motion. Right foraminal stenosis. Recommend repeat MRI of the lumbar spine when the patient is able to better tolerate the examination with less patient motion. 2.  No acute osseous injury of the lumbar spine. Electronically Signed   By: Kathreen Devoid   On: 06/14/2018 19:51   Mr Lumbar Spine Wo Contrast  Result Date: 06/14/2018 CLINICAL DATA:  Mid low back pain for 2 months EXAM: MRI THORACIC AND LUMBAR SPINE WITHOUT CONTRAST TECHNIQUE: Multiplanar and multiecho pulse sequences of the thoracic and lumbar spine were obtained without intravenous contrast. COMPARISON:  Lumbar spine MRI 05/06/2018 FINDINGS: Severe patient motion degrading image quality limiting evaluation. Portion of the examination are nondiagnostic. MRI THORACIC SPINE FINDINGS Alignment:  Physiologic. Vertebrae: No fracture, evidence of discitis, or bone lesion. Cord:  Normal signal and morphology. Paraspinal and other soft tissues: No acute paraspinal abnormality. Disc levels: Disc spaces:  Disc spaces are relatively preserved. C6-7: Mild broad-based disc bulge. No foraminal or central canal stenosis. T1-T2: No disc protrusion, foraminal stenosis or central canal stenosis. T2-T3: No disc protrusion, foraminal stenosis or central canal stenosis. T3-T4: Broad-based disc bulge. Moderate bilateral facet arthropathy. Mild spinal stenosis. T4-T5: No disc protrusion, foraminal stenosis or central canal stenosis. Bilateral mild facet arthropathy. T5-T6: No disc protrusion, foraminal stenosis or central canal stenosis. T6-T7: No disc protrusion, foraminal stenosis or central canal  stenosis. T7-T8: No disc protrusion, foraminal stenosis or central canal stenosis. T8-T9: No disc protrusion, foraminal stenosis or central canal stenosis. T9-T10: No disc protrusion, foraminal stenosis or central canal stenosis. T10-T11: No disc protrusion, foraminal stenosis or central canal stenosis. T11-T12: No disc protrusion, foraminal stenosis or central canal stenosis. MRI LUMBAR SPINE FINDINGS Segmentation:  Standard. Alignment:  Physiologic. Vertebrae: No fracture, evidence of discitis, or aggressive bone lesion. Low marrow signal throughout the lumbar spine which is limited in evaluation secondary to patient motion. Conus medullaris and cauda equina: Conus extends to the T12 level. Conus and cauda equina appear normal. Paraspinal and other soft tissues: No acute paraspinal abnormality. Disc levels: Disc spaces: Posterior lumbar interbody fusion from L1 through L4. T12-L1: Minimal broad-based disc bulge. Mild bilateral facet arthropathy. No evidence of neural foraminal stenosis. No central canal stenosis. L1-L2: Interbody fusion. No evidence of neural foraminal stenosis. No central canal stenosis. L2-L3: Interbody fusion. Evaluation is extremely limited secondary to patient motion degrading image quality. There is suggestion of persistent right central/right paracentral epidural soft tissue, but characterization is extremely difficult for given the degree of patient motion. Right foraminal stenosis. No central canal stenosis. L3-L4: Interbody fusion. No evidence of neural foraminal stenosis. No central canal stenosis. L4-L5: Minimal broad-based disc bulge. Mild bilateral facet scratch them moderate bilateral facet arthropathy. Mild left foraminal stenosis. No right foraminal stenosis. No central canal stenosis. L5-S1: Broad-based disc bulge. Moderate bilateral facet arthropathy. Mild left foraminal stenosis. No significant right foraminal stenosis. No central canal stenosis. IMPRESSION: Severe patient  motion degrading image quality limiting evaluation. Portion of the examination are nondiagnostic. MR THORACIC SPINE IMPRESSION 1. No aggressive osseous  lesion to suggest malignancy. 2.  No acute osseous injury of the thoracic spine. MR LUMBAR SPINE IMPRESSION 1. L2-3 is extremely limited in evaluation secondary to patient motion degrading image quality. There is suggestion of persistent right central/right paracentral epidural soft tissue, but characterization is extremely difficult for given the degree of patient motion. Right foraminal stenosis. Recommend repeat MRI of the lumbar spine when the patient is able to better tolerate the examination with less patient motion. 2.  No acute osseous injury of the lumbar spine. Electronically Signed   By: Kathreen Devoid   On: 06/14/2018 19:51   Ct Biopsy  Result Date: 06/16/2018 INDICATION: 58 year old male referred for bone marrow biopsy with history of possible multiple myeloma EXAM: CT BONE MARROW BIOPSY AND ASPIRATION; CT BIOPSY MEDICATIONS: None. ANESTHESIA/SEDATION: Moderate (conscious) sedation was employed during this procedure. A total of Versed 3.0 mg and Fentanyl 100 mcg was administered intravenously. Moderate Sedation Time: 10 minutes. The patient's level of consciousness and vital signs were monitored continuously by radiology nursing throughout the procedure under my direct supervision. FLUOROSCOPY TIME:  CT COMPLICATIONS: None PROCEDURE: The procedure risks, benefits, and alternatives were explained to the patient. Questions regarding the procedure were encouraged and answered. The patient understands and consents to the procedure. Scout CT of the pelvis was performed for surgical planning purposes. The posterior pelvis was prepped with Chlorhexidine in a sterile fashion, and a sterile drape was applied covering the operative field. A sterile gown and sterile gloves were used for the procedure. Local anesthesia was provided with 1% Lidocaine. Posterior  left iliac bone was targeted for biopsy. The skin and subcutaneous tissues were infiltrated with 1% lidocaine without epinephrine. A small stab incision was made with an 11 blade scalpel, and an 11 gauge Murphy needle was advanced with CT guidance to the posterior cortex. Manual forced was used to advance the needle through the posterior cortex and the stylet was removed. A bone marrow aspirate was retrieved and passed to a cytotechnologist in the room. The initial aspirate was negative for visualized spicules/bone particles. Secondary aspirate was performed with heparin. The Murphy needle was then advanced without the stylet for a core biopsy. The core biopsy was retrieved and also passed to a cytotechnologist. A second core biopsy was achieved given the absence of bone particles. Manual pressure was used for hemostasis and a sterile dressing was placed. No complications were encountered no significant blood loss was encountered. Patient tolerated the procedure well and remained hemodynamically stable throughout. IMPRESSION: Status post CT-guided bone marrow biopsy, with tissue specimen sent to pathology for complete histopathologic analysis Signed, Dulcy Fanny. Earleen Newport, DO [facility] Radiology Electronically Signed   By: Corrie Mckusick D.O.   On: 06/16/2018 12:03   Dg Bone Survey Met  Result Date: 06/15/2018 CLINICAL DATA:  58 year old male with a history of hypercalcemia EXAM: METASTATIC BONE SURVEY COMPARISON:  Chest CT 06/14/2018, plain film 06/12/2018, lumbar CT 05/06/2018 FINDINGS: Skull: No acute fracture. No lytic or sclerotic lesion identified.  Endodontal disease. Cervical spine: Surgical changes of anterior cervical discectomy infusion with anterior plate screw fixation B7-S2 with no complicating features. Degenerative changes at the levels above and below. No lytic lesions or sclerotic lesions.  Alignment maintained. Thoracic spine: Vertebral body heights  maintained with alignment. Mild disc disease of the thoracic spine. No acute fracture. No lytic or sclerotic lesions. Unremarkable appearance of the pedicles. Lumbar spine: Surgical changes of posterior lumbar interbody fusion with bilateral pedicle screw and rod  fixation spanning L1-L4. No complicating features. No acute fracture. The lytic changes that were identified on prior CT are not visualized on the current plain film. Moderate disc disease and facet disease of the lumbar spine. No lytic or sclerotic lesions identified. Chest: Cardiomediastinal silhouette within normal limits. Blunting of the bilateral costophrenic angles with obscuration of the hemidiaphragms bilaterally. No interlobular septal thickening. No rib fracture identified. Pelvis: No acute fracture. Sclerotic changes at the inferior aspect of the left sacroiliac joint on the iliac aspect, similar to prior CT abdomen. No lytic changes. Degenerative changes of the hips. Right upper extremity: No acute fracture.  No lucent lesion or sclerotic focus. Left upper extremity: No acute fracture.  No lucent lesion or sclerotic changes. Right lower extremity: No acute fracture.  No no lucent lesion or sclerotic lesion. Left lower extremity: No acute fracture.  No lucent lesion or sclerotic lesion. IMPRESSION: Abnormal lucent changes identified in the lumbar spine on the prior CT dated 05/06/2018 are not visualized on the current plain film. If concern for malignancy, correlation with either nuclear medicine bone scan or PET-CT may be useful. Sclerotic focus at the inferior aspect of the left sacral iliac joint, present on prior CT pelvis. If concern for malignancy, correlation with either nuclear medicine bone scan or PET-CT may be useful. Bilateral small pleural effusions, present on prior CT. Electronically Signed   By: Corrie Mckusick D.O.   On: 06/15/2018 09:22   Ct Bone Marrow Biopsy & Aspiration  Result Date: 06/16/2018 INDICATION: 58 year old male  referred for bone marrow biopsy with history of possible multiple myeloma EXAM: CT BONE MARROW BIOPSY AND ASPIRATION; CT BIOPSY MEDICATIONS: None. ANESTHESIA/SEDATION: Moderate (conscious) sedation was employed during this procedure. A total of Versed 3.0 mg and Fentanyl 100 mcg was administered intravenously. Moderate Sedation Time: 10 minutes. The patient's level of consciousness and vital signs were monitored continuously by radiology nursing throughout the procedure under my direct supervision. FLUOROSCOPY TIME:  CT COMPLICATIONS: None PROCEDURE: The procedure risks, benefits, and alternatives were explained to the patient. Questions regarding the procedure were encouraged and answered. The patient understands and consents to the procedure. Scout CT of the pelvis was performed for surgical planning purposes. The posterior pelvis was prepped with Chlorhexidine in a sterile fashion, and a sterile drape was applied covering the operative field. A sterile gown and sterile gloves were used for the procedure. Local anesthesia was provided with 1% Lidocaine. Posterior left iliac bone was targeted for biopsy. The skin and subcutaneous tissues were infiltrated with 1% lidocaine without epinephrine. A small stab incision was made with an 11 blade scalpel, and an 11 gauge Murphy needle was advanced with CT guidance to the posterior cortex. Manual forced was used to advance the needle through the posterior cortex and the stylet was removed. A bone marrow aspirate was retrieved and passed to a cytotechnologist in the room. The initial aspirate was negative for visualized spicules/bone particles. Secondary aspirate was performed with heparin. The Murphy needle was then advanced without the stylet for a core biopsy. The core biopsy was retrieved and also passed to a cytotechnologist. A second core biopsy was achieved given the absence of bone particles. Manual pressure was used for hemostasis and a sterile dressing was placed.  No complications were encountered no significant blood loss was encountered. Patient tolerated the procedure well and remained hemodynamically stable throughout. IMPRESSION: Status post CT-guided bone marrow biopsy, with tissue specimen sent to pathology for complete histopathologic analysis Signed, Dulcy Fanny. Earleen Newport, DO  [facility] Radiology Electronically Signed   By: Corrie Mckusick D.O.   On: 06/16/2018 12:03     MEDICATIONS: I have reviewed the patient's current medications.     Assessment/Plan:  1.  Kappa light chain myeloma: - Acute renal failure and hypercalcemia secondary to kappa light chain myeloma. - MRI of the thoracic spine did not show any osseous lesions.  MRI of the lumbar spine showed extremely limited evaluation secondary to motion.  There is suggestion of persistent right central/right paracentral epidural soft tissue but characterization was extremely difficult. -Bone survey on 06/15/2018 showed no lytic lesions. -SPEP showed 0.4 g of monoclonal protein. -Urine immunofixation was consistent with Bence-Jones protein, kappa type. - Serum immunofixation shows presence of monoclonal free kappa light chain.  No heavy chain was detected. - Kappa free light chains were elevated at 12,085.  Lambda light chains were 6.1.  Free light chain ratio is 1981. - He underwent bone marrow biopsy today. - Up to 20% of myeloma is characterized by only light chain in the serum or urine, lacking immunoglobulin heavy chain.  Incidence of renal failure is much higher in this type of myeloma. -We will await for myeloma FISH studies for accurate prognostication. -I have talked to the patient and his daughter about the new findings.  I have recommended treatment as soon as possible.  We can use CyBorD regimen (cyclophosphamide, bortezomib and dexamethasone).  We talked about the side effects in detail. - I will start him on a flat dose of 300 mg of  cyclophosphamide given his renal dysfunction.  We will give regular dose of Velcade at 1.3 mg/m and dexamethasone of 40 mg. -I plan to start him on acyclovir for shingles prophylaxis once the renal function stabilizes. - Once he is discharged from the hospital, we will substitute cyclophosphamide with renally dosed lenalidomide.  2.  Hypercalcemia: - His calcium level continues to be high at 12 today. - He is continuing to receive calcitonin 4 units/kg twice daily.  He is also continuing to receive IV hydration and Lasix. - I will go ahead and order denosumab today.  We talked about side effects in detail.   All questions were answered. The patient knows to call the clinic with any problems, questions or concerns. We can certainly see the patient much sooner if necessary.     Derek Jack

## 2018-06-16 NOTE — Progress Notes (Signed)
Dressing to back remains dry and intact.  May remove tomorrow at 1200 if desired to shower and then use bandaid.  Overall pain remained a 10.  Dr. Carles Collet increased pain medication.  To have chemo tomorrow. Patient and daughter do not want information given to his sister, Neoma Laming.

## 2018-06-16 NOTE — Consult Note (Signed)
Chief Complaint: Patient was seen in consultation today for CT-guided bone marrow biopsy Chief Complaint  Patient presents with  . Abdominal Pain    Referring Physician(s): [place]  Supervising Physician: Corrie Mckusick  Patient Status: [facility] IP  History of Present Illness: [name] is a 58 y.o. male with history of COPD, anemia, testicular cancer, substance abuse, spinal stenosis and lumbar radiculopathy with prior fusion in 2017 patient presented to [facility] with abdominal pain, flank pain, leg weakness, nausea.  Laboratory evaluation revealed hypercalcemia, proteinuria, as well as positive M spike on protein electrophoresis and Bence-Jones protein-kappa type on urine electrophoresis.  He presents today for CT-guided bone marrow biopsy for further evaluation.  Past Medical History:  Diagnosis Date  . Allergy   . Arthritis    neck and back  . Blood transfusion without reported diagnosis   . BPH (benign prostatic hyperplasia)   . Cancer [facility]) 2004   testicle  . Chronic kidney disease    kidney stones  . Left lumbar radiculopathy 06/12/2016  . 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 Medical history non-contributory    Pt has scattered thoughts and uncertain of past medical history  . 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 Stones, urinary tract   . Substance abuse ([place])    prescribed oxydcodone- 10 years    Past Surgical History:  Procedure Laterality Date  . BACK SURGERY     x5  . CERVICAL DISC SURGERY     x2  . COLONOSCOPY WITH PROPOFOL N/A 08/11/2016   Procedure: COLONOSCOPY WITH PROPOFOL;  Surgeon: [name], MD;  Location: [facility] ENDO SUITE;  Service: Endoscopy;  Laterality: N/A;  130   . POLYPECTOMY  08/11/2016   Procedure: POLYPECTOMY;  Surgeon: [name], MD;  Location: [facility] ENDO SUITE;  Service: Endoscopy;;  colon  . testicular cancer  2004  . TONSILLECTOMY      Allergies: Acetaminophen; Cymbalta [duloxetine hcl]; Diclofenac sodium;  Imodium [loperamide]; Lyrica [pregabalin]; Morphine; Vioxx [rofecoxib]; and Aleve [naproxen]  Medications: Prior to Admission medications   Medication Sig Start Date End Date Taking? Authorizing Provider  traMADol (ULTRAM) 50 MG tablet Take 1 tablet (50 mg total) by mouth every 6 (six) hours as needed. 05/29/18  Yes Malvin Johns, MD  lidocaine (LIDODERM) 5 % Place 1 patch onto the skin daily. Remove & Discard patch within 12 hours or as directed by MD Patient not taking: Reported on 06/12/2018 03/31/18   Volanda Napoleon, PA-C  OVER THE COUNTER MEDICATION [facility] as needed    [provider]     Family History  Problem Relation Age of Onset  . COPD Mother   . Heart disease Mother   . Other Father        Never knew his father.  . Thyroid disease Sister   . Arthritis Sister   . Heart disease Sister        bypass  . Colon cancer Neg Hx     Social History   Socioeconomic History  . Marital status: Single    Spouse name: Not on file  . Number of children: 1  . Years of education: 82  . Highest education level: Not on file  Occupational History  . Occupation: retired    Comment: Psychologist, prison and probation services  . Occupation: disabled  Social Needs  . Financial resource strain: Not on file  . Food insecurity:    Worry: Not on file    Inability: Not on file  . Transportation  needs:    Medical: Not on file    Non-medical: Not on file  Tobacco Use  . Smoking status: Current Every Day Smoker    Packs/day: 1.00    Years: 40.00    Pack years: 40.00    Types: Cigarettes    Start date: 03/31/1974  . Smokeless tobacco: Never Used  Substance and Sexual Activity  . Alcohol use: Yes    Comment: Occasional  . Drug use: No    Types: Cocaine    Comment: Remote hx of cocaine, quit 1989  . Sexual activity: Yes  Lifestyle  . Physical activity:    Days per week: Not on file    Minutes per session: Not on file  . Stress: Not on file  Relationships  . Social connections:    Talks on  phone: Not on file    Gets together: Not on file    Attends religious service: Not on file    Active member of club or organization: Not on file    Attends meetings of clubs or organizations: Not on file    Relationship status: Not on file  Other Topics Concern  . Not on file  Social History Narrative   [employer] for 12 years   [employer] for 55 years      Never married   One daughter      Lives alone   Collect stamps   Plays trumpet   Right-handed   Occasional caffeine use            Review of Systems see above; denies fever, vomiting.  Has had  headaches, some intermittent chest discomfort, cough, abd/back pain and some trace blood in stool.  Vital Signs: BP 122/70 (BP Location: Right Arm)   Pulse (!) 103   Temp 98.3 F (36.8 C) (Oral)   Resp 16   Ht 6' (1.829 m)   Wt 170 lb (77.1 kg)   SpO2 97%   BMI 23.06 kg/m   Physical Exam awake, alert.  Chest with dim/distant breath sounds bilaterally.  Heart with regular rate and rhythm.  Abdomen soft, positive bowel sounds, some mild diffuse tenderness to palpation.  No lower extremity edema.  Imaging: Ct Abdomen Pelvis Wo Contrast  Result Date: 06/12/2018 CLINICAL DATA:  Abdominal pain and fever EXAM: CT ABDOMEN AND PELVIS WITHOUT CONTRAST TECHNIQUE: Multidetector CT imaging of the abdomen and pelvis was performed following the standard protocol without IV contrast. Oral contrast was administered. COMPARISON:  Aug 12, 2012 FINDINGS: Lower chest: There are free-flowing pleural effusions bilaterally with bibasilar atelectasis. There is questionable mild consolidation in the posterior left lung base as well. Hepatobiliary: No focal liver lesions are appreciable on this noncontrast enhanced study. The gallbladder wall is not appreciably thickened. There is no biliary duct dilatation. Pancreas: No pancreatic mass or inflammatory focus. Spleen: No splenic lesions are evident. Adrenals/Urinary Tract: Adrenals bilaterally appear  unremarkable. Kidneys bilaterally show no evident mass or hydronephrosis on either side. There is no appreciable renal or ureteral calculus on either side. Urinary bladder is midline with wall thickness within normal limits. Stomach/Bowel: There are sigmoid diverticula without overt diverticulitis. There is wall thickening in portions of the mid sigmoid colon without surrounding inflammation. Suspect muscular hypertrophy in this area from chronic diverticulosis. No bowel wall thickening is noted elsewhere. No bowel obstruction appreciable. No free air or portal venous air. Vascular/Lymphatic: There is aortic and bilateral iliac artery atherosclerosis. No aneurysm evident. There is no appreciable adenopathy in the abdomen  or pelvis. Reproductive: Prostate and seminal vesicles appear normal in size and contour. There is no appreciable pelvic mass. Other: Appendix appears normal. No abscess or ascites is evident in the abdomen or pelvis. Musculoskeletal: There is extensive postoperative change from L1-L4. There is arthropathy throughout the lumbar region. There are no blastic or lytic bone lesions. There is no intramuscular or abdominal wall lesion evident. IMPRESSION: 1. Moderate free-flowing pleural effusions bilaterally with bibasilar atelectasis. Question a degree of superimposed pneumonia in the posterior left base. 2. Wall thickening in the mid sigmoid colon, likely due to muscular hypertrophy from chronic diverticulosis. Earliest changes of diverticulitis in this area are difficult to entirely exclude. No overt diverticulitis is evident. 3. No bowel obstruction. Appendix appears normal. No evident abscess in the abdomen or pelvis. 4. No evident renal or ureteral calculus. No evident hydronephrosis on either side. 5.  Aortoiliac atherosclerosis. 6.  Extensive postoperative change throughout the lumbar spine. Electronically Signed   By: Lowella Grip III M.D.   On: 06/12/2018 17:14   Dg Chest 2  View  Result Date: 06/12/2018 CLINICAL DATA:  Stomach pain for a week. No vomiting or diarrhea. Fever. EXAM: CHEST - 2 VIEW COMPARISON:  CT chest 05/28/2016. Chest 11/04/2010 FINDINGS: Normal heart size and pulmonary vascularity. Small left pleural effusion with infiltration in the left lung base. This suggests pneumonia. Right lung is clear. No pneumothorax. Mediastinal contours appear intact. IMPRESSION: Small left pleural effusion with infiltration in the left lung base suggesting pneumonia. Electronically Signed   By: Lucienne Capers M.D.   On: 06/12/2018 21:18   Ct Chest Wo Contrast  Result Date: 06/14/2018 CLINICAL DATA:  58 year old male with history of chest pain and pleural effusion. EXAM: CT CHEST WITHOUT CONTRAST TECHNIQUE: Multidetector CT imaging of the chest was performed following the standard protocol without IV contrast. COMPARISON:  Chest CT 05/20/2016.  Chest x-ray 06/12/2018. FINDINGS: Cardiovascular: Heart size is normal. There is no significant pericardial fluid, thickening or pericardial calcification. Aortic atherosclerosis. No coronary artery calcifications. Mediastinum/Nodes: No pathologically enlarged mediastinal or hilar lymph nodes. Esophagus is unremarkable in appearance. No axillary lymphadenopathy. Lungs/Pleura: Multiple tiny 2-3 mm pulmonary nodules scattered throughout both lungs, several of which are calcified, compatible with benign granulomas. No larger more suspicious appearing pulmonary nodules or masses are noted. Moderate-sized bilateral pleural effusions lying dependently. This is associated with some mild passive atelectasis in the lower lobes of the lungs bilaterally. No acute consolidative airspace disease. Diffuse bronchial wall thickening with mild centrilobular and paraseptal emphysema. Upper Abdomen: Aortic atherosclerosis. Musculoskeletal: There are no aggressive appearing lytic or blastic lesions noted in the visualized portions of the skeleton. IMPRESSION:  1. Moderate bilateral pleural effusions lying dependently with areas of passive subsegmental atelectasis in the lower lobes of the lungs bilaterally. 2. Multiple tiny 2-3 mm pulmonary nodules scattered throughout the lungs bilaterally, nonspecific, but similar to prior study from 05/28/2016, considered definitively benign. 3. Aortic atherosclerosis. 4. Diffuse bronchial wall thickening with mild centrilobular and paraseptal emphysema; imaging findings suggestive of underlying COPD. Aortic Atherosclerosis (ICD10-I70.0) and Emphysema (ICD10-J43.9). Electronically Signed   By: Vinnie Langton M.D.   On: 06/14/2018 10:53   Mr Thoracic Spine Wo Contrast  Result Date: 06/14/2018 CLINICAL DATA:  Mid low back pain for 2 months EXAM: MRI THORACIC AND LUMBAR SPINE WITHOUT CONTRAST TECHNIQUE: Multiplanar and multiecho pulse sequences of the thoracic and lumbar spine were obtained without intravenous contrast. COMPARISON:  Lumbar spine MRI 05/06/2018 FINDINGS: Severe patient motion degrading image quality limiting evaluation.  Portion of the examination are nondiagnostic. MRI THORACIC SPINE FINDINGS Alignment:  Physiologic. Vertebrae: No fracture, evidence of discitis, or bone lesion. Cord:  Normal signal and morphology. Paraspinal and other soft tissues: No acute paraspinal abnormality. Disc levels: Disc spaces:  Disc spaces are relatively preserved. C6-7: Mild broad-based disc bulge. No foraminal or central canal stenosis. T1-T2: No disc protrusion, foraminal stenosis or central canal stenosis. T2-T3: No disc protrusion, foraminal stenosis or central canal stenosis. T3-T4: Broad-based disc bulge. Moderate bilateral facet arthropathy. Mild spinal stenosis. T4-T5: No disc protrusion, foraminal stenosis or central canal stenosis. Bilateral mild facet arthropathy. T5-T6: No disc protrusion, foraminal stenosis or central canal stenosis. T6-T7: No disc protrusion, foraminal stenosis or central canal stenosis. T7-T8: No disc  protrusion, foraminal stenosis or central canal stenosis. T8-T9: No disc protrusion, foraminal stenosis or central canal stenosis. T9-T10: No disc protrusion, foraminal stenosis or central canal stenosis. T10-T11: No disc protrusion, foraminal stenosis or central canal stenosis. T11-T12: No disc protrusion, foraminal stenosis or central canal stenosis. MRI LUMBAR SPINE FINDINGS Segmentation:  Standard. Alignment:  Physiologic. Vertebrae: No fracture, evidence of discitis, or aggressive bone lesion. Low marrow signal throughout the lumbar spine which is limited in evaluation secondary to patient motion. Conus medullaris and cauda equina: Conus extends to the T12 level. Conus and cauda equina appear normal. Paraspinal and other soft tissues: No acute paraspinal abnormality. Disc levels: Disc spaces: Posterior lumbar interbody fusion from L1 through L4. T12-L1: Minimal broad-based disc bulge. Mild bilateral facet arthropathy. No evidence of neural foraminal stenosis. No central canal stenosis. L1-L2: Interbody fusion. No evidence of neural foraminal stenosis. No central canal stenosis. L2-L3: Interbody fusion. Evaluation is extremely limited secondary to patient motion degrading image quality. There is suggestion of persistent right central/right paracentral epidural soft tissue, but characterization is extremely difficult for given the degree of patient motion. Right foraminal stenosis. No central canal stenosis. L3-L4: Interbody fusion. No evidence of neural foraminal stenosis. No central canal stenosis. L4-L5: Minimal broad-based disc bulge. Mild bilateral facet scratch them moderate bilateral facet arthropathy. Mild left foraminal stenosis. No right foraminal stenosis. No central canal stenosis. L5-S1: Broad-based disc bulge. Moderate bilateral facet arthropathy. Mild left foraminal stenosis. No significant right foraminal stenosis. No central canal stenosis. IMPRESSION: Severe patient motion degrading image  quality limiting evaluation. Portion of the examination are nondiagnostic. MR THORACIC SPINE IMPRESSION 1. No aggressive osseous lesion to suggest malignancy. 2.  No acute osseous injury of the thoracic spine. MR LUMBAR SPINE IMPRESSION 1. L2-3 is extremely limited in evaluation secondary to patient motion degrading image quality. There is suggestion of persistent right central/right paracentral epidural soft tissue, but characterization is extremely difficult for given the degree of patient motion. Right foraminal stenosis. Recommend repeat MRI of the lumbar spine when the patient is able to better tolerate the examination with less patient motion. 2.  No acute osseous injury of the lumbar spine. Electronically Signed   By: Kathreen Devoid   On: 06/14/2018 19:51   Mr Lumbar Spine Wo Contrast  Result Date: 06/14/2018 CLINICAL DATA:  Mid low back pain for 2 months EXAM: MRI THORACIC AND LUMBAR SPINE WITHOUT CONTRAST TECHNIQUE: Multiplanar and multiecho pulse sequences of the thoracic and lumbar spine were obtained without intravenous contrast. COMPARISON:  Lumbar spine MRI 05/06/2018 FINDINGS: Severe patient motion degrading image quality limiting evaluation. Portion of the examination are nondiagnostic. MRI THORACIC SPINE FINDINGS Alignment:  Physiologic. Vertebrae: No fracture, evidence of discitis, or bone lesion. Cord:  Normal signal and morphology. Paraspinal  and other soft tissues: No acute paraspinal abnormality. Disc levels: Disc spaces:  Disc spaces are relatively preserved. C6-7: Mild broad-based disc bulge. No foraminal or central canal stenosis. T1-T2: No disc protrusion, foraminal stenosis or central canal stenosis. T2-T3: No disc protrusion, foraminal stenosis or central canal stenosis. T3-T4: Broad-based disc bulge. Moderate bilateral facet arthropathy. Mild spinal stenosis. T4-T5: No disc protrusion, foraminal stenosis or central canal stenosis. Bilateral mild facet arthropathy. T5-T6: No disc  protrusion, foraminal stenosis or central canal stenosis. T6-T7: No disc protrusion, foraminal stenosis or central canal stenosis. T7-T8: No disc protrusion, foraminal stenosis or central canal stenosis. T8-T9: No disc protrusion, foraminal stenosis or central canal stenosis. T9-T10: No disc protrusion, foraminal stenosis or central canal stenosis. T10-T11: No disc protrusion, foraminal stenosis or central canal stenosis. T11-T12: No disc protrusion, foraminal stenosis or central canal stenosis. MRI LUMBAR SPINE FINDINGS Segmentation:  Standard. Alignment:  Physiologic. Vertebrae: No fracture, evidence of discitis, or aggressive bone lesion. Low marrow signal throughout the lumbar spine which is limited in evaluation secondary to patient motion. Conus medullaris and cauda equina: Conus extends to the T12 level. Conus and cauda equina appear normal. Paraspinal and other soft tissues: No acute paraspinal abnormality. Disc levels: Disc spaces: Posterior lumbar interbody fusion from L1 through L4. T12-L1: Minimal broad-based disc bulge. Mild bilateral facet arthropathy. No evidence of neural foraminal stenosis. No central canal stenosis. L1-L2: Interbody fusion. No evidence of neural foraminal stenosis. No central canal stenosis. L2-L3: Interbody fusion. Evaluation is extremely limited secondary to patient motion degrading image quality. There is suggestion of persistent right central/right paracentral epidural soft tissue, but characterization is extremely difficult for given the degree of patient motion. Right foraminal stenosis. No central canal stenosis. L3-L4: Interbody fusion. No evidence of neural foraminal stenosis. No central canal stenosis. L4-L5: Minimal broad-based disc bulge. Mild bilateral facet scratch them moderate bilateral facet arthropathy. Mild left foraminal stenosis. No right foraminal stenosis. No central canal stenosis. L5-S1: Broad-based disc bulge. Moderate bilateral facet arthropathy. Mild  left foraminal stenosis. No significant right foraminal stenosis. No central canal stenosis. IMPRESSION: Severe patient motion degrading image quality limiting evaluation. Portion of the examination are nondiagnostic. MR THORACIC SPINE IMPRESSION 1. No aggressive osseous lesion to suggest malignancy. 2.  No acute osseous injury of the thoracic spine. MR LUMBAR SPINE IMPRESSION 1. L2-3 is extremely limited in evaluation secondary to patient motion degrading image quality. There is suggestion of persistent right central/right paracentral epidural soft tissue, but characterization is extremely difficult for given the degree of patient motion. Right foraminal stenosis. Recommend repeat MRI of the lumbar spine when the patient is able to better tolerate the examination with less patient motion. 2.  No acute osseous injury of the lumbar spine. Electronically Signed   By: Kathreen Devoid   On: 06/14/2018 19:51   US Renal  Result Date: 06/14/2018 CLINICAL DATA:  Acute on chronic renal failure EXAM: RENAL / URINARY TRACT ULTRASOUND COMPLETE COMPARISON:  06/12/2018 FINDINGS: Right Kidney: Renal measurements: 11.6 x 4.9 x 7.6 cm = volume: 224 mL. Diffuse increased echogenicity is noted. No mass or hydronephrosis is noted. Left Kidney: Renal measurements: 11.9 x 6.5 x 5.5 cm = volume: 222 mL. Increased echogenicity is noted. No mass lesion or hydronephrosis is seen. Bladder: Appears normal for degree of bladder distention. Bilateral pleural effusions are noted similar to that seen on prior CT. IMPRESSION: Diffuse increased echogenicity consistent with the given clinical history. Bilateral small effusions. Electronically Signed   By: Linus Mako.D.  On: 06/14/2018 10:30   Dg Bone Survey Met  Result Date: 06/15/2018 CLINICAL DATA:  58 year old male with a history of hypercalcemia EXAM: METASTATIC BONE SURVEY COMPARISON:  Chest CT 06/14/2018, plain film 06/12/2018, lumbar CT 05/06/2018 FINDINGS: Skull: No acute fracture.  No lytic or sclerotic lesion identified.  Endodontal disease. Cervical spine: Surgical changes of anterior cervical discectomy infusion with anterior plate screw fixation [id number] with no complicating features. Degenerative changes at the levels above and below. No lytic lesions or sclerotic lesions.  Alignment maintained. Thoracic spine: Vertebral body heights maintained with alignment. Mild disc disease of the thoracic spine. No acute fracture. No lytic or sclerotic lesions. Unremarkable appearance of the pedicles. Lumbar spine: Surgical changes of posterior lumbar interbody fusion with bilateral pedicle screw and rod fixation spanning L1-L4. No complicating features. No acute fracture. The lytic changes that were identified on prior CT are not visualized on the current plain film. Moderate disc disease and facet disease of the lumbar spine. No lytic or sclerotic lesions identified. Chest: Cardiomediastinal silhouette within normal limits. Blunting of the bilateral costophrenic angles with obscuration of the hemidiaphragms bilaterally. No interlobular septal thickening. No rib fracture identified. Pelvis: No acute fracture. Sclerotic changes at the inferior aspect of the left sacroiliac joint on the iliac aspect, similar to prior CT abdomen. No lytic changes. Degenerative changes of the hips. Right upper extremity: No acute fracture.  No lucent lesion or sclerotic focus. Left upper extremity: No acute fracture.  No lucent lesion or sclerotic changes. Right lower extremity: No acute fracture.  No no lucent lesion or sclerotic lesion. Left lower extremity: No acute fracture.  No lucent lesion or sclerotic lesion. IMPRESSION: Abnormal lucent changes identified in the lumbar spine on the prior CT dated 05/06/2018 are not visualized on the current plain film. If concern for malignancy, correlation with either nuclear medicine bone scan or PET-CT may be useful. Sclerotic focus at the inferior aspect of the left sacral  iliac joint, present on prior CT pelvis. If concern for malignancy, correlation with either nuclear medicine bone scan or PET-CT may be useful. Bilateral small pleural effusions, present on prior CT. Electronically Signed   By: Corrie Mckusick D.O.   On: 06/15/2018 09:22   Dg Inject Diag/thera/inc Needle/cath/plc Epi/lumb/sac W/img  Result Date: 05/27/2018 CLINICAL DATA:  Previous fusion surgery. Back and right lower extremity pain. EXAM: SELECTIVE NERVE ROOT BLOCK AND TRANSFORAMINAL EPIDURAL STEROID INJECTION UNDER FLUOROSCOPY FLUOROSCOPY TIME:  43 seconds; 6 uGym2 DAP TECHNIQUE: The procedure, risks (including but not limited to bleeding, infection, organ damage ), benefits, and alternatives were explained to the patient. Questions regarding the procedure were encouraged and answered. The patient understands and consents to the procedure. An appropriate skin entry site was determined under fluoroscopy. Operator donned sterile gloves and mask. Site was marked, prepped with Betadine, draped in usual sterile fashion, infiltrated locally with 1% lidocaine. A 22 gauge spinal needle was advanced to the superior ventral margin of the right L2-3 neural foramen. Diagnostic injection of 2 ml Omnipaque 180 showed partial outlining of the exiting nerve root as well as minimal epidural extension of contrast, with no intravascular or subarachnoid component. 120 mg Depo-Medrol in 3 ml lidocaine 1% was administered. The patient tolerated procedure well, with no immediate complication. IMPRESSION: 1. Technically successful right L2 selective nerve root block and transforaminal epidural steroid injection Electronically Signed   By: Lucrezia Europe M.D.   On: 05/27/2018 15:28    Labs:  CBC: Recent Labs    06/13/18  1962 06/14/18 0548 06/15/18 0534 06/16/18 0503  WBC 9.6 10.7* 12.0* 11.0*  HGB 7.3* 7.0* 9.8* 9.4*  HCT 22.9* 22.5* 29.7* 28.7*  PLT 150 165 150 158    COAGS: Recent Labs    06/15/18 1153  INR 1.0     BMP: Recent Labs    06/13/18 0639 06/14/18 0548 06/15/18 0534 06/16/18 0503  NA 134* 135 134* 134*  K 4.5 4.9 4.6 4.3  CL 105 108 101 99  CO2 16* 16* 18* 21*  GLUCOSE 83 82 80 98  BUN 85* 71* 63* 57*  CALCIUM 12.3* 12.2* 12.9* 12.0*  CREATININE 5.48* 5.14* 4.92* 4.50*  GFRNONAA 10* 11* 12* 13*  GFRAA 12* 13* 14* 15*    LIVER FUNCTION TESTS: Recent Labs    06/12/18 1325 06/15/18 0534 06/16/18 0503  BILITOT 0.6 0.3  --   AST 20 22  --   ALT 23 19  --   ALKPHOS 99 82  --   PROT 7.8 7.1  --   ALBUMIN 4.4 3.8  3.8 3.7    TUMOR MARKERS: No results for input(s): AFPTM, CEA, CA199, CHROMGRNA in the last 8760 hours.  Assessment and Plan:  58 y.o. male with history of COPD, anemia, testicular cancer, substance abuse, spinal stenosis and lumbar radiculopathy with prior fusion in 2017 patient presented to [facility] with abdominal pain, flank pain, leg weakness, nausea.  Laboratory evaluation revealed hypercalcemia, proteinuria, as well as positive M spike on protein electrophoresis and Bence-Jones protein-kappa type on urine electrophoresis.  He presents today for CT-guided bone marrow biopsy for further evaluation.Plans discussed with Dr. Delton Coombes.  Risks and benefits of procedure was discussed with the patient  including, but not limited to bleeding, infection, damage to adjacent structures or low yield requiring additional tests.  All of the questions were answered and there is agreement to proceed.  Consent signed and in chart.     Thank you for this interesting consult.  I greatly enjoyed meeting [name] and look forward to participating in their care.  A copy of this report was sent to the requesting provider on this date.  Electronically Signed: D. Rowe Robert, PA-C 06/16/2018, 10:11 AM   I spent a total of  20 minutes   in face to face in clinical consultation, greater than 50% of which was counseling/coordinating care for CT-guided bone  marrow biopsy

## 2018-06-17 ENCOUNTER — Inpatient Hospital Stay (HOSPITAL_COMMUNITY): Payer: Medicare Other

## 2018-06-17 VITALS — BP 124/56 | HR 89 | Temp 98.1°F | Resp 18

## 2018-06-17 DIAGNOSIS — Z515 Encounter for palliative care: Secondary | ICD-10-CM

## 2018-06-17 DIAGNOSIS — C9000 Multiple myeloma not having achieved remission: Secondary | ICD-10-CM

## 2018-06-17 DIAGNOSIS — Z7189 Other specified counseling: Secondary | ICD-10-CM

## 2018-06-17 LAB — COMPREHENSIVE METABOLIC PANEL
ALT: 18 U/L (ref 0–44)
AST: 20 U/L (ref 15–41)
Albumin: 3.7 g/dL (ref 3.5–5.0)
Alkaline Phosphatase: 71 U/L (ref 38–126)
Anion gap: 15 (ref 5–15)
BUN: 58 mg/dL — ABNORMAL HIGH (ref 6–20)
CO2: 22 mmol/L (ref 22–32)
Calcium: 11.6 mg/dL — ABNORMAL HIGH (ref 8.9–10.3)
Chloride: 97 mmol/L — ABNORMAL LOW (ref 98–111)
Creatinine, Ser: 4.33 mg/dL — ABNORMAL HIGH (ref 0.61–1.24)
GFR calc Af Amer: 16 mL/min — ABNORMAL LOW (ref >60)
GFR calc non Af Amer: 14 mL/min — ABNORMAL LOW (ref >60)
Glucose, Bld: 85 mg/dL (ref 70–99)
Potassium: 4.1 mmol/L (ref 3.5–5.1)
Sodium: 134 mmol/L — ABNORMAL LOW (ref 135–145)
Total Bilirubin: 0.5 mg/dL (ref 0.3–1.2)
Total Protein: 6.6 g/dL (ref 6.5–8.1)

## 2018-06-17 LAB — CBC WITH DIFFERENTIAL/PLATELET
Abs Immature Granulocytes: 0.26 10*3/uL — ABNORMAL HIGH (ref 0.00–0.07)
Basophils Absolute: 0.1 10*3/uL (ref 0.0–0.1)
Basophils Relative: 1 %
Eosinophils Absolute: 0.1 10*3/uL (ref 0.0–0.5)
Eosinophils Relative: 1 %
HCT: 27.7 % — ABNORMAL LOW (ref 39.0–52.0)
Hemoglobin: 9.4 g/dL — ABNORMAL LOW (ref 13.0–17.0)
Immature Granulocytes: 3 %
Lymphocytes Relative: 35 %
Lymphs Abs: 3.4 10*3/uL (ref 0.7–4.0)
MCH: 34.1 pg — ABNORMAL HIGH (ref 26.0–34.0)
MCHC: 33.9 g/dL (ref 30.0–36.0)
MCV: 100.4 fL — ABNORMAL HIGH (ref 80.0–100.0)
Monocytes Absolute: 2.1 10*3/uL — ABNORMAL HIGH (ref 0.1–1.0)
Monocytes Relative: 22 %
Neutro Abs: 3.7 10*3/uL (ref 1.7–7.7)
Neutrophils Relative %: 38 %
Platelets: 155 10*3/uL (ref 150–400)
RBC: 2.76 MIL/uL — ABNORMAL LOW (ref 4.22–5.81)
RDW: 18.9 % — ABNORMAL HIGH (ref 11.5–15.5)
WBC: 9.6 10*3/uL (ref 4.0–10.5)
nRBC: 2.2 % — ABNORMAL HIGH (ref 0.0–0.2)

## 2018-06-17 LAB — RENAL FUNCTION PANEL
Albumin: 3.6 g/dL (ref 3.5–5.0)
Anion gap: 14 (ref 5–15)
BUN: 58 mg/dL — ABNORMAL HIGH (ref 6–20)
CO2: 22 mmol/L (ref 22–32)
Calcium: 11.6 mg/dL — ABNORMAL HIGH (ref 8.9–10.3)
Chloride: 99 mmol/L (ref 98–111)
Creatinine, Ser: 4.3 mg/dL — ABNORMAL HIGH (ref 0.61–1.24)
GFR calc Af Amer: 16 mL/min — ABNORMAL LOW (ref >60)
GFR calc non Af Amer: 14 mL/min — ABNORMAL LOW (ref >60)
Glucose, Bld: 84 mg/dL (ref 70–99)
Phosphorus: 6.3 mg/dL — ABNORMAL HIGH (ref 2.5–4.6)
Potassium: 4.1 mmol/L (ref 3.5–5.1)
Sodium: 135 mmol/L (ref 135–145)

## 2018-06-17 LAB — CULTURE, BLOOD (ROUTINE X 2)
Culture: NO GROWTH
Culture: NO GROWTH
Special Requests: ADEQUATE
Special Requests: ADEQUATE

## 2018-06-17 MED ORDER — DENOSUMAB 120 MG/1.7ML ~~LOC~~ SOLN
120.0000 mg | Freq: Once | SUBCUTANEOUS | Status: AC
  Administered 2018-06-17: 120 mg via SUBCUTANEOUS

## 2018-06-17 MED ORDER — PALONOSETRON HCL INJECTION 0.25 MG/5ML
0.2500 mg | Freq: Once | INTRAVENOUS | Status: AC
  Administered 2018-06-17: 0.25 mg via INTRAVENOUS

## 2018-06-17 MED ORDER — OXYCODONE-ACETAMINOPHEN 7.5-325 MG PO TABS
1.0000 | ORAL_TABLET | ORAL | Status: DC | PRN
  Administered 2018-06-17 – 2018-06-18 (×5): 1 via ORAL
  Filled 2018-06-17 (×5): qty 1

## 2018-06-17 MED ORDER — BISACODYL 10 MG RE SUPP
10.0000 mg | Freq: Once | RECTAL | Status: AC
  Administered 2018-06-17: 10 mg via RECTAL
  Filled 2018-06-17: qty 1

## 2018-06-17 MED ORDER — SODIUM CHLORIDE 0.9 % IV SOLN
Freq: Once | INTRAVENOUS | Status: AC
  Administered 2018-06-17: 09:00:00 via INTRAVENOUS

## 2018-06-17 MED ORDER — SODIUM CHLORIDE 0.9 % IV SOLN
300.0000 mg | Freq: Once | INTRAVENOUS | Status: AC
  Administered 2018-06-17: 300 mg via INTRAVENOUS
  Filled 2018-06-17: qty 15

## 2018-06-17 MED ORDER — SODIUM CHLORIDE 0.9 % IV SOLN
40.0000 mg | Freq: Once | INTRAVENOUS | Status: AC
  Administered 2018-06-17: 40 mg via INTRAVENOUS
  Filled 2018-06-17: qty 4

## 2018-06-17 MED ORDER — OXYCODONE HCL 5 MG PO TABS: 7.5000 mg | ORAL_TABLET | ORAL | Status: DC | PRN

## 2018-06-17 MED ORDER — BORTEZOMIB CHEMO SQ INJECTION 3.5 MG (2.5MG/ML)
1.3000 mg/m2 | Freq: Once | INTRAMUSCULAR | Status: AC
  Administered 2018-06-17: 2.5 mg via SUBCUTANEOUS
  Filled 2018-06-17: qty 1

## 2018-06-17 MED ORDER — SENNOSIDES-DOCUSATE SODIUM 8.6-50 MG PO TABS
2.0000 | ORAL_TABLET | Freq: Two times a day (BID) | ORAL | Status: DC
  Administered 2018-06-17 – 2018-06-22 (×10): 2 via ORAL
  Filled 2018-06-17 (×12): qty 2

## 2018-06-17 NOTE — Patient Instructions (Signed)
[facility] Discharge Instructions for Patients Receiving Chemotherapy  Today you received the following chemotherapy agents velcade,cytoxan, velcade   To help prevent nausea and vomiting after your treatment, we encourage you to take your nausea medication    If you develop nausea and vomiting that is not controlled by your nausea medication, call the clinic.   BELOW ARE SYMPTOMS THAT SHOULD BE REPORTED IMMEDIATELY:  *FEVER GREATER THAN 100.5 F  *CHILLS WITH OR WITHOUT FEVER  NAUSEA AND VOMITING THAT IS NOT CONTROLLED WITH YOUR NAUSEA MEDICATION  *UNUSUAL SHORTNESS OF BREATH  *UNUSUAL BRUISING OR BLEEDING  TENDERNESS IN MOUTH AND THROAT WITH OR WITHOUT PRESENCE OF ULCERS  *URINARY PROBLEMS  *BOWEL PROBLEMS  UNUSUAL RASH Items with * indicate a potential emergency and should be followed up as soon as possible.  Feel free to call the clinic should you have any questions or concerns. The clinic phone number is [phone].  Please show the [facility] at check-in to the Emergency Department and triage nurse.

## 2018-06-17 NOTE — Progress Notes (Signed)
PROGRESS NOTE  Sorren Vallier JGG:[id number] DOB: 17-Jun-1960 DOA: 06/12/2018 PCP: Patient, No Pcp Per  Brief History: 58 year old male with a history of COPD, testicular cancer, substance abuse, spinal stenosis and lumbar radiculopathy status post fusion 07/24/2015([place])presenting with abdominal pain radiating to the bilateral flanks as well as nausea without any emesis or diarrhea. He denies any fevers, chills, chest pain, shortness of breath, coughing. Unfortunately, patient is a difficult historian and tends to be tangential. Nevertheless, he has also been complaining of worsening lower back pain and leg weakness. He has not fallen or had any syncope. He has been taking over-the-counter NSAIDs as well as tramadol. Patient had MRI of the lumbar spine on 05/06/2018 which showed abnormal right prevertebral and epidural soft tissue at L2-3 concerning for tumor infection. There is also right L3 nerve impingement. There is moderate L4-5 canal stenosis with moderate to severe L2-3 narrowing. The patient subsequently had L2 nerve block performed on 05/27/2018. Upon presentation, the patient was noted to have hypercalcemia with a corrected calcium of 13.1. The patient was started on IV fluidswith some improvement of his serum calcium.  Subsequently, the patient was started on calcitonin.  Renal and heme/onc were consulted to assist with management.  Assessment/Plan: AKI -Secondary to NSAIDs as well as hypercalcemia -Renal ultrasound--neg hydronephrosis -SPEP--positive M-spike in gamma region -Serum Immunofixation--pending -serum kappa/lambda light chains--pending -urine immunofixation = kappa Bence Jones protein -baseline creatinine ~1.0 -urine protein/creatinine ratio--5.29 -appreciate nephrology input  Hypercalcemia -Intact PTH--9 -likely due to underlying malignancy--data pointing to myeloma -Continue IV fluids as serum calcium is improving -SPEP--positive M-spike in  gamma region -Immunofixation--pending -serum kappa/lambda light chains--pending -urine immunofixation = kappa Bence-Jones protein -heme/onc consult-->Katragadda spoke with IR after I spoke with Dr. Vernard Gambles (IR)-->only wants BM biopsy, cancelled L2-3 biopsy -finish 4 doses calcitonin [place]  Lumbar radiculopathy -05/06/2018 MRI L-spine--diffusely abnormal bone marrow signal concerning for myeloproliferative disorder versus infiltrative metastatic disease. Abnormal prevertebral tissue as discussed above at L2-3 -3/16&3/17--discussed with neurosurgery, Dr. Melynda Keller nonoperative management for radiculopathy -06/15/18--discussed with IR (Dr. Gaye Pollack for needle bx of L2-3 soft tissue-->placed order in Epic -Dr. Delton Coombes spoke with IR after I spoke with Dr. Vernard Gambles (IR)-->only wants BM biopsy, cancelled L2-3 biopsy -3/16 MR T-spine--no acute findings 3/16 MR L-spine--motion degraded with persistent paracentral soft tissue -06/15/18--personally ambulated pt--pt able to ambulate with minimal to no assist  Bilateral pleural effusions -Echocardiogram--probably normal EF, poor quality -Urine protein creatinine ratio--5.29 -Stable on room air  Proteinuria -pt has nephrotic range proteinuria -has contributed to pleural effusions -urine protein/creatinine ratio 5.29 -nephrology following--appreciated  COPD -Stable on room air -Patient states that he quit smoking 1 month ago  Pulmonary opacity -CT abdomen and pelvis showed consolidation in the posterior left lower lobe -Patient has no fever or shortness of breath -Viral respiratory panel shows rhinovirus -Check procalcitonin--7.71 -CT chest--no consolidation, moderate bilateral pleural effusion with atelectasis.  Multiple tiny bilateral nodules. -d/c ceftriaxone and azithromycin-->remains afebrile and hemodynamically stable  Abdominal Pain -improving after BM -3/14 CT abd--mid sigmoid wall thickening without  surrounding inflammation--?muscular hypertrophy  Macrocytic anemia -L46--503 -folic TWSF--6.8 -iron saturation 35%, ferritin 62 -transfused 2 units 06/14/18 -started folate supplementation    Disposition Plan:Not stable for d/c Family Communication:daughter updated at bedside 3/19  Consultants:Renal/heme/onc and IR  Code Status: FULL   DVT Prophylaxis: [place] Heparin    Procedures: As Listed in Progress Note Above  Antibiotics: Ceftriaxone 3/14>>>3/16 azithrommycin 3/14>>>3/16      Subjective:  Daughter at  bedside, patient states hydrocodone  not effective, no BM in a couple days now  Objective: Vitals:   06/15/18 1617 06/15/18 2130 06/16/18 0505 06/16/18 2051  BP: (!) 110/56 (!) 120/96 122/70   Pulse: (!) 104 (!) 111 (!) 103   Resp: 17 18 16    Temp: 98.5 F (36.9 C) 98 F (36.7 C) 98.3 F (36.8 C)   TempSrc: Oral Oral Oral   SpO2: 93% 97% 97% 96%  Weight:      Height:        Intake/Output Summary (Last 24 hours) at 06/17/2018 1916 Last data filed at 06/17/2018 1800 Gross per 24 hour  Intake 638.96 ml  Output 3700 ml  Net -3061.04 ml   Weight change:  Exam:   General:  Pt is alert, follows commands appropriately, not in acute distress  HEENT: No icterus, No thrush, No neck mass, [place]/AT  Cardiovascular: RRR, S1/S2, no rubs, no gallops  Respiratory: bibasilar crackles, no wheeze  Abdomen: Soft/+BS, non tender, non distended, no guarding  Extremities: No edema, No lymphangitis, No petechiae, No rashes, no synovitis  Neuro:  CN II-XII intact, strength 4-/5 in RLE, strength 4/5 LUE, LLE; sensation intact bilateral; no dysmetria; babinski equivocal     Basic Metabolic Panel: Recent Labs  Lab 06/13/18 0639 06/14/18 0548 06/15/18 0534 06/16/18 0503 06/17/18 0424  NA 134* 135 134* 134* 135  134*  K 4.5 4.9 4.6 4.3 4.1  4.1  CL 105 108 101 99 99  97*  CO2 16* 16* 18* 21* 22  22  GLUCOSE 83 82 80 98 84  85  BUN 85* 71*  63* 57* 58*  58*  CREATININE 5.48* 5.14* 4.92* 4.50* 4.30*  4.33*  CALCIUM 12.3* 12.2* 12.9* 12.0* 11.6*  11.6*  PHOS  --   --  6.0* 6.2* 6.3*   Liver Function Tests: Recent Labs  Lab 06/12/18 1325 06/15/18 0534 06/16/18 0503 06/17/18 0424  AST 20 22  --  20  ALT 23 19  --  18  ALKPHOS 99 82  --  71  BILITOT 0.6 0.3  --  0.5  PROT 7.8 7.1  --  6.6  ALBUMIN 4.4 3.8  3.8 3.7 3.6  3.7   Recent Labs  Lab 06/12/18 1325  LIPASE 33   No results for input(s): AMMONIA in the last 168 hours. Coagulation Profile: Recent Labs  Lab 06/15/18 1153  INR 1.0   CBC: Recent Labs  Lab 06/13/18 0639 06/14/18 0548 06/15/18 0534 06/16/18 0503 06/17/18 0424  WBC 9.6 10.7* 12.0* 11.0* 9.6  NEUTROABS 3.6 4.2 3.9 4.3 3.7  HGB 7.3* 7.0* 9.8* 9.4* 9.4*  HCT 22.9* 22.5* 29.7* 28.7* 27.7*  MCV 111.2* 111.4* 100.3* 101.1* 100.4*  PLT 150 165 150 158 155   Cardiac Enzymes: Recent Labs  Lab 06/14/18 0949  CKTOTAL 50   BNP: Invalid input(s): POCBNP CBG: No results for input(s): GLUCAP in the last 168 hours. HbA1C: No results for input(s): HGBA1C in the last 72 hours. Urine analysis:    Component Value Date/Time   COLORURINE YELLOW 06/12/2018 1356   APPEARANCEUR CLEAR 06/12/2018 1356   LABSPEC 1.012 06/12/2018 1356   PHURINE 5.0 06/12/2018 1356   GLUCOSEU NEGATIVE 06/12/2018 1356   HGBUR MODERATE (A) 06/12/2018 1356   BILIRUBINUR NEGATIVE 06/12/2018 1356   KETONESUR NEGATIVE 06/12/2018 1356   PROTEINUR 30 (A) 06/12/2018 1356   UROBILINOGEN 0.2 02/09/2008 2042   NITRITE NEGATIVE 06/12/2018 [place] 06/12/2018 1356   Sepsis Labs: @  LABRCNTIP(procalcitonin:4,lacticidven:4) ) Recent Results (from the past 240 hour(s))  Culture, blood (routine x 2) Call MD if unable to obtain prior to antibiotics being given     Status: None   Collection Time: 06/12/18 10:41 PM  Result Value Ref Range Status   Specimen Description BLOOD RIGHT HAND  Final   Special  Requests   Final    BOTTLES DRAWN AEROBIC AND ANAEROBIC Blood Culture adequate volume   Culture   Final    NO GROWTH 5 DAYS Performed at [facility], [address], [place], [place] [address]    Report Status 06/17/2018 FINAL  Final  Respiratory Panel by PCR     Status: Abnormal   Collection Time: 06/12/18 10:44 PM  Result Value Ref Range Status   Adenovirus NOT DETECTED NOT DETECTED Final   Coronavirus 229E NOT DETECTED NOT DETECTED Final    Comment: (NOTE) The Coronavirus on the Respiratory Panel, DOES NOT test for the novel  Coronavirus (2019 nCoV)    Coronavirus HKU1 NOT DETECTED NOT DETECTED Final   Coronavirus NL63 NOT DETECTED NOT DETECTED Final   Coronavirus OC43 NOT DETECTED NOT DETECTED Final   Metapneumovirus NOT DETECTED NOT DETECTED Final   Rhinovirus / Enterovirus DETECTED (A) NOT DETECTED Final   Influenza A NOT DETECTED NOT DETECTED Final   Influenza B NOT DETECTED NOT DETECTED Final   Parainfluenza Virus 1 NOT DETECTED NOT DETECTED Final   Parainfluenza Virus 2 NOT DETECTED NOT DETECTED Final   Parainfluenza Virus 3 NOT DETECTED NOT DETECTED Final   Parainfluenza Virus 4 NOT DETECTED NOT DETECTED Final   Respiratory Syncytial Virus NOT DETECTED NOT DETECTED Final   Bordetella pertussis NOT DETECTED NOT DETECTED Final   Chlamydophila pneumoniae NOT DETECTED NOT DETECTED Final   Mycoplasma pneumoniae NOT DETECTED NOT DETECTED Final    Comment: Performed at [facility], [place] [address] [place], [place] [address]  Culture, blood (routine x 2) Call MD if unable to obtain prior to antibiotics being given     Status: None   Collection Time: 06/12/18 10:58 PM  Result Value Ref Range Status   Specimen Description BLOOD LEFT ARM  Final   Special Requests   Final    BOTTLES DRAWN AEROBIC AND ANAEROBIC Blood Culture adequate volume   Culture   Final    NO GROWTH 5 DAYS Performed at [facility], [address], [place], [place] [address]    Report Status  06/17/2018 FINAL  Final  MRSA PCR Screening     Status: None   Collection Time: 06/13/18 12:20 AM  Result Value Ref Range Status   MRSA by PCR NEGATIVE NEGATIVE Final    Comment:        The GeneXpert MRSA Assay (FDA approved for NASAL specimens only), is one component of a comprehensive MRSA colonization surveillance program. It is not intended to diagnose MRSA infection nor to guide or monitor treatment for MRSA infections. Performed at [facility], [address], [place], [place] [address]      Scheduled Meds: . bisacodyl  10 mg Rectal Once  . feeding supplement (ENSURE ENLIVE)  237 mL Oral BID BM  . folic acid  1 mg Oral Daily  . furosemide  40 mg Intravenous Q6H  . lidocaine  1 patch Transdermal Q24H  . senna-docusate  2 tablet Oral BID  . sodium bicarbonate  650 mg Oral BID   Continuous Infusions: . sodium chloride 125 mL/hr at 06/17/18 4536    Procedures/Studies: Ct  Abdomen Pelvis Wo Contrast  Result Date: 06/12/2018 CLINICAL DATA:  Abdominal pain and fever EXAM: CT ABDOMEN AND PELVIS WITHOUT CONTRAST TECHNIQUE: Multidetector CT imaging of the abdomen and pelvis was performed following the standard protocol without IV contrast. Oral contrast was administered. COMPARISON:  Aug 12, 2012 FINDINGS: Lower chest: There are free-flowing pleural effusions bilaterally with bibasilar atelectasis. There is questionable mild consolidation in the posterior left lung base as well. Hepatobiliary: No focal liver lesions are appreciable on this noncontrast enhanced study. The gallbladder wall is not appreciably thickened. There is no biliary duct dilatation. Pancreas: No pancreatic mass or inflammatory focus. Spleen: No splenic lesions are evident. Adrenals/Urinary Tract: Adrenals bilaterally appear unremarkable. Kidneys bilaterally show no evident mass or hydronephrosis on either side. There is no appreciable renal or ureteral calculus on either side. Urinary bladder is midline with wall  thickness within normal limits. Stomach/Bowel: There are sigmoid diverticula without overt diverticulitis. There is wall thickening in portions of the mid sigmoid colon without surrounding inflammation. Suspect muscular hypertrophy in this area from chronic diverticulosis. No bowel wall thickening is noted elsewhere. No bowel obstruction appreciable. No free air or portal venous air. Vascular/Lymphatic: There is aortic and bilateral iliac artery atherosclerosis. No aneurysm evident. There is no appreciable adenopathy in the abdomen or pelvis. Reproductive: Prostate and seminal vesicles appear normal in size and contour. There is no appreciable pelvic mass. Other: Appendix appears normal. No abscess or ascites is evident in the abdomen or pelvis. Musculoskeletal: There is extensive postoperative change from L1-L4. There is arthropathy throughout the lumbar region. There are no blastic or lytic bone lesions. There is no intramuscular or abdominal wall lesion evident. IMPRESSION: 1. Moderate free-flowing pleural effusions bilaterally with bibasilar atelectasis. Question a degree of superimposed pneumonia in the posterior left base. 2. Wall thickening in the mid sigmoid colon, likely due to muscular hypertrophy from chronic diverticulosis. Earliest changes of diverticulitis in this area are difficult to entirely exclude. No overt diverticulitis is evident. 3. No bowel obstruction. Appendix appears normal. No evident abscess in the abdomen or pelvis. 4. No evident renal or ureteral calculus. No evident hydronephrosis on either side. 5.  Aortoiliac atherosclerosis. 6.  Extensive postoperative change throughout the lumbar spine. Electronically Signed   By: Lowella Grip III M.D.   On: 06/12/2018 17:14   Dg Chest 2 View  Result Date: 06/12/2018 CLINICAL DATA:  Stomach pain for a week. No vomiting or diarrhea. Fever. EXAM: CHEST - 2 VIEW COMPARISON:  CT chest 05/28/2016. Chest 11/04/2010 FINDINGS: Normal heart size  and pulmonary vascularity. Small left pleural effusion with infiltration in the left lung base. This suggests pneumonia. Right lung is clear. No pneumothorax. Mediastinal contours appear intact. IMPRESSION: Small left pleural effusion with infiltration in the left lung base suggesting pneumonia. Electronically Signed   By: Lucienne Capers M.D.   On: 06/12/2018 21:18   Ct Chest Wo Contrast  Result Date: 06/14/2018 CLINICAL DATA:  58 year old male with history of chest pain and pleural effusion. EXAM: CT CHEST WITHOUT CONTRAST TECHNIQUE: Multidetector CT imaging of the chest was performed following the standard protocol without IV contrast. COMPARISON:  Chest CT 05/20/2016.  Chest x-ray 06/12/2018. FINDINGS: Cardiovascular: Heart size is normal. There is no significant pericardial fluid, thickening or pericardial calcification. Aortic atherosclerosis. No coronary artery calcifications. Mediastinum/Nodes: No pathologically enlarged mediastinal or hilar lymph nodes. Esophagus is unremarkable in appearance. No axillary lymphadenopathy. Lungs/Pleura: Multiple tiny 2-3 mm pulmonary nodules scattered throughout both lungs, several of which are calcified, compatible  with benign granulomas. No larger more suspicious appearing pulmonary nodules or masses are noted. Moderate-sized bilateral pleural effusions lying dependently. This is associated with some mild passive atelectasis in the lower lobes of the lungs bilaterally. No acute consolidative airspace disease. Diffuse bronchial wall thickening with mild centrilobular and paraseptal emphysema. Upper Abdomen: Aortic atherosclerosis. Musculoskeletal: There are no aggressive appearing lytic or blastic lesions noted in the visualized portions of the skeleton. IMPRESSION: 1. Moderate bilateral pleural effusions lying dependently with areas of passive subsegmental atelectasis in the lower lobes of the lungs bilaterally. 2. Multiple tiny 2-3 mm pulmonary nodules scattered  throughout the lungs bilaterally, nonspecific, but similar to prior study from 05/28/2016, considered definitively benign. 3. Aortic atherosclerosis. 4. Diffuse bronchial wall thickening with mild centrilobular and paraseptal emphysema; imaging findings suggestive of underlying COPD. Aortic Atherosclerosis (ICD10-I70.0) and Emphysema (ICD10-J43.9). Electronically Signed   By: Vinnie Langton M.D.   On: 06/14/2018 10:53   Mr Thoracic Spine Wo Contrast  Result Date: 06/14/2018 CLINICAL DATA:  Mid low back pain for 2 months EXAM: MRI THORACIC AND LUMBAR SPINE WITHOUT CONTRAST TECHNIQUE: Multiplanar and multiecho pulse sequences of the thoracic and lumbar spine were obtained without intravenous contrast. COMPARISON:  Lumbar spine MRI 05/06/2018 FINDINGS: Severe patient motion degrading image quality limiting evaluation. Portion of the examination are nondiagnostic. MRI THORACIC SPINE FINDINGS Alignment:  Physiologic. Vertebrae: No fracture, evidence of discitis, or bone lesion. Cord:  Normal signal and morphology. Paraspinal and other soft tissues: No acute paraspinal abnormality. Disc levels: Disc spaces:  Disc spaces are relatively preserved. C6-7: Mild broad-based disc bulge. No foraminal or central canal stenosis. T1-T2: No disc protrusion, foraminal stenosis or central canal stenosis. T2-T3: No disc protrusion, foraminal stenosis or central canal stenosis. T3-T4: Broad-based disc bulge. Moderate bilateral facet arthropathy. Mild spinal stenosis. T4-T5: No disc protrusion, foraminal stenosis or central canal stenosis. Bilateral mild facet arthropathy. T5-T6: No disc protrusion, foraminal stenosis or central canal stenosis. T6-T7: No disc protrusion, foraminal stenosis or central canal stenosis. T7-T8: No disc protrusion, foraminal stenosis or central canal stenosis. T8-T9: No disc protrusion, foraminal stenosis or central canal stenosis. T9-T10: No disc protrusion, foraminal stenosis or central canal stenosis.  T10-T11: No disc protrusion, foraminal stenosis or central canal stenosis. T11-T12: No disc protrusion, foraminal stenosis or central canal stenosis. MRI LUMBAR SPINE FINDINGS Segmentation:  Standard. Alignment:  Physiologic. Vertebrae: No fracture, evidence of discitis, or aggressive bone lesion. Low marrow signal throughout the lumbar spine which is limited in evaluation secondary to patient motion. Conus medullaris and cauda equina: Conus extends to the T12 level. Conus and cauda equina appear normal. Paraspinal and other soft tissues: No acute paraspinal abnormality. Disc levels: Disc spaces: Posterior lumbar interbody fusion from L1 through L4. T12-L1: Minimal broad-based disc bulge. Mild bilateral facet arthropathy. No evidence of neural foraminal stenosis. No central canal stenosis. L1-L2: Interbody fusion. No evidence of neural foraminal stenosis. No central canal stenosis. L2-L3: Interbody fusion. Evaluation is extremely limited secondary to patient motion degrading image quality. There is suggestion of persistent right central/right paracentral epidural soft tissue, but characterization is extremely difficult for given the degree of patient motion. Right foraminal stenosis. No central canal stenosis. L3-L4: Interbody fusion. No evidence of neural foraminal stenosis. No central canal stenosis. L4-L5: Minimal broad-based disc bulge. Mild bilateral facet scratch them moderate bilateral facet arthropathy. Mild left foraminal stenosis. No right foraminal stenosis. No central canal stenosis. L5-S1: Broad-based disc bulge. Moderate bilateral facet arthropathy. Mild left foraminal stenosis. No significant right foraminal stenosis.  No central canal stenosis. IMPRESSION: Severe patient motion degrading image quality limiting evaluation. Portion of the examination are nondiagnostic. MR THORACIC SPINE IMPRESSION 1. No aggressive osseous lesion to suggest malignancy. 2.  No acute osseous injury of the thoracic spine.  MR LUMBAR SPINE IMPRESSION 1. L2-3 is extremely limited in evaluation secondary to patient motion degrading image quality. There is suggestion of persistent right central/right paracentral epidural soft tissue, but characterization is extremely difficult for given the degree of patient motion. Right foraminal stenosis. Recommend repeat MRI of the lumbar spine when the patient is able to better tolerate the examination with less patient motion. 2.  No acute osseous injury of the lumbar spine. Electronically Signed   By: Kathreen Devoid   On: 06/14/2018 19:51   Mr Lumbar Spine Wo Contrast  Result Date: 06/14/2018 CLINICAL DATA:  Mid low back pain for 2 months EXAM: MRI THORACIC AND LUMBAR SPINE WITHOUT CONTRAST TECHNIQUE: Multiplanar and multiecho pulse sequences of the thoracic and lumbar spine were obtained without intravenous contrast. COMPARISON:  Lumbar spine MRI 05/06/2018 FINDINGS: Severe patient motion degrading image quality limiting evaluation. Portion of the examination are nondiagnostic. MRI THORACIC SPINE FINDINGS Alignment:  Physiologic. Vertebrae: No fracture, evidence of discitis, or bone lesion. Cord:  Normal signal and morphology. Paraspinal and other soft tissues: No acute paraspinal abnormality. Disc levels: Disc spaces:  Disc spaces are relatively preserved. C6-7: Mild broad-based disc bulge. No foraminal or central canal stenosis. T1-T2: No disc protrusion, foraminal stenosis or central canal stenosis. T2-T3: No disc protrusion, foraminal stenosis or central canal stenosis. T3-T4: Broad-based disc bulge. Moderate bilateral facet arthropathy. Mild spinal stenosis. T4-T5: No disc protrusion, foraminal stenosis or central canal stenosis. Bilateral mild facet arthropathy. T5-T6: No disc protrusion, foraminal stenosis or central canal stenosis. T6-T7: No disc protrusion, foraminal stenosis or central canal stenosis. T7-T8: No disc protrusion, foraminal stenosis or central canal stenosis. T8-T9: No  disc protrusion, foraminal stenosis or central canal stenosis. T9-T10: No disc protrusion, foraminal stenosis or central canal stenosis. T10-T11: No disc protrusion, foraminal stenosis or central canal stenosis. T11-T12: No disc protrusion, foraminal stenosis or central canal stenosis. MRI LUMBAR SPINE FINDINGS Segmentation:  Standard. Alignment:  Physiologic. Vertebrae: No fracture, evidence of discitis, or aggressive bone lesion. Low marrow signal throughout the lumbar spine which is limited in evaluation secondary to patient motion. Conus medullaris and cauda equina: Conus extends to the T12 level. Conus and cauda equina appear normal. Paraspinal and other soft tissues: No acute paraspinal abnormality. Disc levels: Disc spaces: Posterior lumbar interbody fusion from L1 through L4. T12-L1: Minimal broad-based disc bulge. Mild bilateral facet arthropathy. No evidence of neural foraminal stenosis. No central canal stenosis. L1-L2: Interbody fusion. No evidence of neural foraminal stenosis. No central canal stenosis. L2-L3: Interbody fusion. Evaluation is extremely limited secondary to patient motion degrading image quality. There is suggestion of persistent right central/right paracentral epidural soft tissue, but characterization is extremely difficult for given the degree of patient motion. Right foraminal stenosis. No central canal stenosis. L3-L4: Interbody fusion. No evidence of neural foraminal stenosis. No central canal stenosis. L4-L5: Minimal broad-based disc bulge. Mild bilateral facet scratch them moderate bilateral facet arthropathy. Mild left foraminal stenosis. No right foraminal stenosis. No central canal stenosis. L5-S1: Broad-based disc bulge. Moderate bilateral facet arthropathy. Mild left foraminal stenosis. No significant right foraminal stenosis. No central canal stenosis. IMPRESSION: Severe patient motion degrading image quality limiting evaluation. Portion of the examination are nondiagnostic.  MR THORACIC SPINE IMPRESSION 1. No aggressive osseous lesion  to suggest malignancy. 2.  No acute osseous injury of the thoracic spine. MR LUMBAR SPINE IMPRESSION 1. L2-3 is extremely limited in evaluation secondary to patient motion degrading image quality. There is suggestion of persistent right central/right paracentral epidural soft tissue, but characterization is extremely difficult for given the degree of patient motion. Right foraminal stenosis. Recommend repeat MRI of the lumbar spine when the patient is able to better tolerate the examination with less patient motion. 2.  No acute osseous injury of the lumbar spine. Electronically Signed   By: Kathreen Devoid   On: 06/14/2018 19:51   US Renal  Result Date: 06/14/2018 CLINICAL DATA:  Acute on chronic renal failure EXAM: RENAL / URINARY TRACT ULTRASOUND COMPLETE COMPARISON:  06/12/2018 FINDINGS: Right Kidney: Renal measurements: 11.6 x 4.9 x 7.6 cm = volume: 224 mL. Diffuse increased echogenicity is noted. No mass or hydronephrosis is noted. Left Kidney: Renal measurements: 11.9 x 6.5 x 5.5 cm = volume: 222 mL. Increased echogenicity is noted. No mass lesion or hydronephrosis is seen. Bladder: Appears normal for degree of bladder distention. Bilateral pleural effusions are noted similar to that seen on prior CT. IMPRESSION: Diffuse increased echogenicity consistent with the given clinical history. Bilateral small effusions. Electronically Signed   By: Inez Catalina M.D.   On: 06/14/2018 10:30   Ct Biopsy  Result Date: 06/16/2018 INDICATION: 58 year old male referred for bone marrow biopsy with history of possible multiple myeloma EXAM: CT BONE MARROW BIOPSY AND ASPIRATION; CT BIOPSY MEDICATIONS: None. ANESTHESIA/SEDATION: Moderate (conscious) sedation was employed during this procedure. A total of Versed 3.0 mg and Fentanyl 100 mcg was administered intravenously. Moderate Sedation Time: 10 minutes. The patient's level of consciousness and vital signs were  monitored continuously by radiology nursing throughout the procedure under my direct supervision. FLUOROSCOPY TIME:  CT COMPLICATIONS: None PROCEDURE: The procedure risks, benefits, and alternatives were explained to the patient. Questions regarding the procedure were encouraged and answered. The patient understands and consents to the procedure. Scout CT of the pelvis was performed for surgical planning purposes. The posterior pelvis was prepped with Chlorhexidine in a sterile fashion, and a sterile drape was applied covering the operative field. A sterile gown and sterile gloves were used for the procedure. Local anesthesia was provided with 1% Lidocaine. Posterior left iliac bone was targeted for biopsy. The skin and subcutaneous tissues were infiltrated with 1% lidocaine without epinephrine. A small stab incision was made with an 11 blade scalpel, and an 11 gauge Murphy needle was advanced with CT guidance to the posterior cortex. Manual forced was used to advance the needle through the posterior cortex and the stylet was removed. A bone marrow aspirate was retrieved and passed to a cytotechnologist in the room. The initial aspirate was negative for visualized spicules/bone particles. Secondary aspirate was performed with heparin. The Murphy needle was then advanced without the stylet for a core biopsy. The core biopsy was retrieved and also passed to a cytotechnologist. A second core biopsy was achieved given the absence of bone particles. Manual pressure was used for hemostasis and a sterile dressing was placed. No complications were encountered no significant blood loss was encountered. Patient tolerated the procedure well and remained hemodynamically stable throughout. IMPRESSION: Status post CT-guided bone marrow biopsy, with tissue specimen sent to pathology for complete histopathologic analysis Signed, Dulcy Fanny. Earleen Newport, DO [facility] Rad[facility]  Electronically Signed   By: Corrie Mckusick D.O.   On: 06/16/2018 12:03   Dg Bone Survey  Met  Result Date: 06/15/2018 CLINICAL DATA:  58 year old male with a history of hypercalcemia EXAM: METASTATIC BONE SURVEY COMPARISON:  Chest CT 06/14/2018, plain film 06/12/2018, lumbar CT 05/06/2018 FINDINGS: Skull: No acute fracture. No lytic or sclerotic lesion identified.  Endodontal disease. Cervical spine: Surgical changes of anterior cervical discectomy infusion with anterior plate screw fixation [id number] with no complicating features. Degenerative changes at the levels above and below. No lytic lesions or sclerotic lesions.  Alignment maintained. Thoracic spine: Vertebral body heights maintained with alignment. Mild disc disease of the thoracic spine. No acute fracture. No lytic or sclerotic lesions. Unremarkable appearance of the pedicles. Lumbar spine: Surgical changes of posterior lumbar interbody fusion with bilateral pedicle screw and rod fixation spanning L1-L4. No complicating features. No acute fracture. The lytic changes that were identified on prior CT are not visualized on the current plain film. Moderate disc disease and facet disease of the lumbar spine. No lytic or sclerotic lesions identified. Chest: Cardiomediastinal silhouette within normal limits. Blunting of the bilateral costophrenic angles with obscuration of the hemidiaphragms bilaterally. No interlobular septal thickening. No rib fracture identified. Pelvis: No acute fracture. Sclerotic changes at the inferior aspect of the left sacroiliac joint on the iliac aspect, similar to prior CT abdomen. No lytic changes. Degenerative changes of the hips. Right upper extremity: No acute fracture.  No lucent lesion or sclerotic focus. Left upper extremity: No acute fracture.  No lucent lesion or sclerotic changes. Right lower extremity: No acute fracture.  No no lucent lesion or sclerotic lesion. Left lower extremity: No acute fracture.  No lucent lesion or  sclerotic lesion. IMPRESSION: Abnormal lucent changes identified in the lumbar spine on the prior CT dated 05/06/2018 are not visualized on the current plain film. If concern for malignancy, correlation with either nuclear medicine bone scan or PET-CT may be useful. Sclerotic focus at the inferior aspect of the left sacral iliac joint, present on prior CT pelvis. If concern for malignancy, correlation with either nuclear medicine bone scan or PET-CT may be useful. Bilateral small pleural effusions, present on prior CT. Electronically Signed   By: Corrie Mckusick D.O.   On: 06/15/2018 09:22   Ct Bone Marrow Biopsy & Aspiration  Result Date: 06/16/2018 INDICATION: 58 year old male referred for bone marrow biopsy with history of possible multiple myeloma EXAM: CT BONE MARROW BIOPSY AND ASPIRATION; CT BIOPSY MEDICATIONS: None. ANESTHESIA/SEDATION: Moderate (conscious) sedation was employed during this procedure. A total of Versed 3.0 mg and Fentanyl 100 mcg was administered intravenously. Moderate Sedation Time: 10 minutes. The patient's level of consciousness and vital signs were monitored continuously by radiology nursing throughout the procedure under my direct supervision. FLUOROSCOPY TIME:  CT COMPLICATIONS: None PROCEDURE: The procedure risks, benefits, and alternatives were explained to the patient. Questions regarding the procedure were encouraged and answered. The patient understands and consents to the procedure. Scout CT of the pelvis was performed for surgical planning purposes. The posterior pelvis was prepped with Chlorhexidine in a sterile fashion, and a sterile drape was applied covering the operative field. A sterile gown and sterile gloves were used for the procedure. Local anesthesia was provided with 1% Lidocaine. Posterior left iliac bone was targeted for biopsy. The skin and subcutaneous tissues were infiltrated with 1% lidocaine without epinephrine. A small stab incision was made with an 11  blade scalpel, and an 11 gauge Murphy needle was advanced with CT guidance to the posterior cortex. Manual forced was used to advance the needle through the posterior cortex and  the stylet was removed. A bone marrow aspirate was retrieved and passed to a cytotechnologist in the room. The initial aspirate was negative for visualized spicules/bone particles. Secondary aspirate was performed with heparin. The Murphy needle was then advanced without the stylet for a core biopsy. The core biopsy was retrieved and also passed to a cytotechnologist. A second core biopsy was achieved given the absence of bone particles. Manual pressure was used for hemostasis and a sterile dressing was placed. No complications were encountered no significant blood loss was encountered. Patient tolerated the procedure well and remained hemodynamically stable throughout. IMPRESSION: Status post CT-guided bone marrow biopsy, with tissue specimen sent to pathology for complete histopathologic analysis Signed, Dulcy Fanny. Earleen Newport, DO [facility] [place] [facility] Electronically Signed   By: Corrie Mckusick D.O.   On: 06/16/2018 12:03   Dg Inject Diag/thera/inc Needle/cath/plc Epi/lumb/sac W/img  Result Date: 05/27/2018 CLINICAL DATA:  Previous fusion surgery. Back and right lower extremity pain. EXAM: SELECTIVE NERVE ROOT BLOCK AND TRANSFORAMINAL EPIDURAL STEROID INJECTION UNDER FLUOROSCOPY FLUOROSCOPY TIME:  43 seconds; 72 uGym2 DAP TECHNIQUE: The procedure, risks (including but not limited to bleeding, infection, organ damage ), benefits, and alternatives were explained to the patient. Questions regarding the procedure were encouraged and answered. The patient understands and consents to the procedure. An appropriate skin entry site was determined under fluoroscopy. Operator donned sterile gloves and mask. Site was marked, prepped with Betadine, draped in usual sterile fashion, infiltrated locally with 1%  lidocaine. A 22 gauge spinal needle was advanced to the superior ventral margin of the right L2-3 neural foramen. Diagnostic injection of 2 ml Omnipaque 180 showed partial outlining of the exiting nerve root as well as minimal epidural extension of contrast, with no intravascular or subarachnoid component. 120 mg Depo-Medrol in 3 ml lidocaine 1% was administered. The patient tolerated procedure well, with no immediate complication. IMPRESSION: 1. Technically successful right L2 selective nerve root block and transforaminal epidural steroid injection Electronically Signed   By: Lucrezia Europe M.D.   On: 05/27/2018 15:28    Armida Vickroy Denton Brick, MD [facility]  If 7PM-7AM, please contact night-coverage [email or web address] Password [email or web address] 06/17/2018, 7:16 PM   LOS: 5 days

## 2018-06-17 NOTE — Plan of Care (Signed)
Patient appointment at [facility] is 8:00-8:45. He refused at first. Patient educated and now agrees to go to his appointment.  Patient transferred to the [place] via wheelchair by Probation officer.

## 2018-06-17 NOTE — Progress Notes (Signed)
[facility] ROUNDING NOTE   Subjective:   This is a 58 year old gentleman who presented with hypercalcemia and acute kidney injury.  He appears to have a paraproteinemia and underwent a bone marrow biopsy 06/16/2018.  Appreciate the assistance of Dr. Delton Coombes.  Free light chain ratio is 1981.  We are waiting for myeloma FISH studies.  He is to undergo chemotherapy with cyclophosphamide 300 mg flat dose, bortezomib 1.3 mg/m and dexamethasone 40 mg daily.  Lasix 40 mg every 6 hours, calcitonin 4 units/kg twice daily, sodium bicarbonate 650 mg twice daily, folic acid 1 mg daily, IV sodium chloride 125 cc an hour oral Ensure Enlive 237 cc twice daily.  Pressure 118/60 pulse 93 temperature 97.9 urine output 2375 cc 06/16/2018.  No recent weights.  Appears to be negative fluid balance by Is and Os   would be curious to know the weight patient.  Sodium 135 potassium 4.1 chloride 99 CO2 22 BUN 58 creatinine 4.3 calcium 11.6 phosphorus 6.3 albumin 3.6 WBC 9.6 hemoglobin 9.4 platelets 155  MRI lumbar spine shows L2 - 3 with a limited evaluation although there is a suggestive presence of a right central/right paracentral epidural soft tissue.  Right foraminal stenosis and recommended repeat MRI.  Bone marrow survey revealed bilateral small pleural effusions a sclerotic focus of the inferior aspect of the left sacral iliac joint and abnormal lucent changes in the lumbar spine.  Objective:  Vital signs in last 24 hours:  Temp:  [97.9 F (36.6 C)-98.2 F (36.8 C)] 97.9 F (36.6 C) (03/19 0832) Pulse Rate:  [85-109] 93 (03/19 0832) Resp:  [14-18] 18 (03/19 0832) BP: (107-124)/(51-110) 118/60 (03/19 0832) SpO2:  [91 %-99 %] 99 % (03/19 0832)  Weight change:  Filed Weights   06/12/18 1259 06/12/18 2221  Weight: 79 kg 77.1 kg    Intake/Output: I/O last 3 completed shifts: In: 340 [P.O.:340] Out: 4775 [Urine:4775]   Intake/Output this shift:  Total I/O In: -  Out: 600  [Urine:600]  CVS- RRR no murmurs rubs gallops  RS- CTA no wheezes or rales ABD- BS present soft non-distended EXT- no edema   Basic Metabolic Panel: Recent Labs  Lab 06/13/18 0639 06/14/18 0548 06/15/18 0534 06/16/18 0503 06/17/18 0424  NA 134* 135 134* 134* 135  134*  K 4.5 4.9 4.6 4.3 4.1  4.1  CL 105 108 101 99 99  97*  CO2 16* 16* 18* 21* 22  22  GLUCOSE 83 82 80 98 84  85  BUN 85* 71* 63* 57* 58*  58*  CREATININE 5.48* 5.14* 4.92* 4.50* 4.30*  4.33*  CALCIUM 12.3* 12.2* 12.9* 12.0* 11.6*  11.6*  PHOS  --   --  6.0* 6.2* 6.3*    Liver Function Tests: Recent Labs  Lab 06/12/18 1325 06/15/18 0534 06/16/18 0503 06/17/18 0424  AST 20 22  --  20  ALT 23 19  --  18  ALKPHOS 99 82  --  71  BILITOT 0.6 0.3  --  0.5  PROT 7.8 7.1  --  6.6  ALBUMIN 4.4 3.8  3.8 3.7 3.6  3.7   Recent Labs  Lab 06/12/18 1325  LIPASE 33   No results for input(s): AMMONIA in the last 168 hours.  CBC: Recent Labs  Lab 06/13/18 0639 06/14/18 0548 06/15/18 0534 06/16/18 0503 06/17/18 0424  WBC 9.6 10.7* 12.0* 11.0* 9.6  NEUTROABS 3.6 4.2 3.9 4.3 3.7  HGB 7.3* 7.0* 9.8* 9.4* 9.4*  HCT 22.9* 22.5* 29.7* 28.7*  27.7*  MCV 111.2* 111.4* 100.3* 101.1* 100.4*  PLT 150 165 150 158 155    Cardiac Enzymes: Recent Labs  Lab 06/14/18 0949  CKTOTAL 50    BNP: Invalid input(s): POCBNP  CBG: No results for input(s): GLUCAP in the last 168 hours.  Microbiology: Results for orders placed or performed during the hospital encounter of 06/12/18  Culture, blood (routine x 2) Call MD if unable to obtain prior to antibiotics being given     Status: None   Collection Time: 06/12/18 10:41 PM  Result Value Ref Range Status   Specimen Description BLOOD RIGHT HAND  Final   Special Requests   Final    BOTTLES DRAWN AEROBIC AND ANAEROBIC Blood Culture adequate volume   Culture   Final    NO GROWTH 5 DAYS Performed at [facility], [address] [place], [place] [address]     Report Status 06/17/2018 FINAL  Final  Respiratory Panel by PCR     Status: Abnormal   Collection Time: 06/12/18 10:44 PM  Result Value Ref Range Status   Adenovirus NOT DETECTED NOT DETECTED Final   Coronavirus 229E NOT DETECTED NOT DETECTED Final    Comment: (NOTE) The Coronavirus on the Respiratory Panel, DOES NOT test for the novel  Coronavirus (2019 nCoV)    Coronavirus HKU1 NOT DETECTED NOT DETECTED Final   Coronavirus NL63 NOT DETECTED NOT DETECTED Final   Coronavirus OC43 NOT DETECTED NOT DETECTED Final   Metapneumovirus NOT DETECTED NOT DETECTED Final   Rhinovirus / Enterovirus DETECTED (A) NOT DETECTED Final   Influenza A NOT DETECTED NOT DETECTED Final   Influenza B NOT DETECTED NOT DETECTED Final   Parainfluenza Virus 1 NOT DETECTED NOT DETECTED Final   Parainfluenza Virus 2 NOT DETECTED NOT DETECTED Final   Parainfluenza Virus 3 NOT DETECTED NOT DETECTED Final   Parainfluenza Virus 4 NOT DETECTED NOT DETECTED Final   Respiratory Syncytial Virus NOT DETECTED NOT DETECTED Final   Bordetella pertussis NOT DETECTED NOT DETECTED Final   Chlamydophila pneumoniae NOT DETECTED NOT DETECTED Final   Mycoplasma pneumoniae NOT DETECTED NOT DETECTED Final    Comment: Performed at [facility], [place] [address], [place], [place] [address]  Culture, blood (routine x 2) Call MD if unable to obtain prior to antibiotics being given     Status: None   Collection Time: 06/12/18 10:58 PM  Result Value Ref Range Status   Specimen Description BLOOD LEFT ARM  Final   Special Requests   Final    BOTTLES DRAWN AEROBIC AND ANAEROBIC Blood Culture adequate volume   Culture   Final    NO GROWTH 5 DAYS Performed at [facility], [address] [place], [place] [address]    Report Status 06/17/2018 FINAL  Final  MRSA PCR Screening     Status: None   Collection Time: 06/13/18 12:20 AM  Result Value Ref Range Status   MRSA by PCR NEGATIVE NEGATIVE Final    Comment:        The GeneXpert MRSA  Assay (FDA approved for NASAL specimens only), is one component of a comprehensive MRSA colonization surveillance program. It is not intended to diagnose MRSA infection nor to guide or monitor treatment for MRSA infections. Performed at [facility], [address] [place], [place] [address]     Coagulation Studies: Recent Labs    06/15/18 1153  LABPROT 12.7  INR 1.0    Urinalysis: No results for input(s): COLORURINE, LABSPEC, [place], GLUCOSEU, HGBUR,  BILIRUBINUR, KETONESUR, PROTEINUR, UROBILINOGEN, NITRITE, LEUKOCYTESUR in the last 72 hours.  Invalid input(s): APPERANCEUR    Imaging: Ct Biopsy  Result Date: 06/16/2018 INDICATION: 58 year old male referred for bone marrow biopsy with history of possible multiple myeloma EXAM: CT BONE MARROW BIOPSY AND ASPIRATION; CT BIOPSY MEDICATIONS: None. ANESTHESIA/SEDATION: Moderate (conscious) sedation was employed during this procedure. A total of Versed 3.0 mg and Fentanyl 100 mcg was administered intravenously. Moderate Sedation Time: 10 minutes. The patient's level of consciousness and vital signs were monitored continuously by radiology nursing throughout the procedure under my direct supervision. FLUOROSCOPY TIME:  CT COMPLICATIONS: None PROCEDURE: The procedure risks, benefits, and alternatives were explained to the patient. Questions regarding the procedure were encouraged and answered. The patient understands and consents to the procedure. Scout CT of the pelvis was performed for surgical planning purposes. The posterior pelvis was prepped with Chlorhexidine in a sterile fashion, and a sterile drape was applied covering the operative field. A sterile gown and sterile gloves were used for the procedure. Local anesthesia was provided with 1% Lidocaine. Posterior left iliac bone was targeted for biopsy. The skin and subcutaneous tissues were infiltrated with 1% lidocaine without epinephrine. A small stab incision was made with an 11 blade  scalpel, and an 11 gauge Murphy needle was advanced with CT guidance to the posterior cortex. Manual forced was used to advance the needle through the posterior cortex and the stylet was removed. A bone marrow aspirate was retrieved and passed to a cytotechnologist in the room. The initial aspirate was negative for visualized spicules/bone particles. Secondary aspirate was performed with heparin. The Murphy needle was then advanced without the stylet for a core biopsy. The core biopsy was retrieved and also passed to a cytotechnologist. A second core biopsy was achieved given the absence of bone particles. Manual pressure was used for hemostasis and a sterile dressing was placed. No complications were encountered no significant blood loss was encountered. Patient tolerated the procedure well and remained hemodynamically stable throughout. IMPRESSION: Status post CT-guided bone marrow biopsy, with tissue specimen sent to pathology for complete histopathologic analysis Signed, Dulcy Fanny. Earleen Newport, DO [facility] Electronically Signed   By: Corrie Mckusick D.O.   On: 06/16/2018 12:03   Ct Bone Marrow Biopsy & Aspiration  Result Date: 06/16/2018 INDICATION: 58 year old male referred for bone marrow biopsy with history of possible multiple myeloma EXAM: CT BONE MARROW BIOPSY AND ASPIRATION; CT BIOPSY MEDICATIONS: None. ANESTHESIA/SEDATION: Moderate (conscious) sedation was employed during this procedure. A total of Versed 3.0 mg and Fentanyl 100 mcg was administered intravenously. Moderate Sedation Time: 10 minutes. The patient's level of consciousness and vital signs were monitored continuously by radiology nursing throughout the procedure under my direct supervision. FLUOROSCOPY TIME:  CT COMPLICATIONS: None PROCEDURE: The procedure risks, benefits, and alternatives were explained to the patient. Questions regarding the procedure were encouraged and answered. The  patient understands and consents to the procedure. Scout CT of the pelvis was performed for surgical planning purposes. The posterior pelvis was prepped with Chlorhexidine in a sterile fashion, and a sterile drape was applied covering the operative field. A sterile gown and sterile gloves were used for the procedure. Local anesthesia was provided with 1% Lidocaine. Posterior left iliac bone was targeted for biopsy. The skin and subcutaneous tissues were infiltrated with 1% lidocaine without epinephrine. A small stab incision was made with an 11 blade scalpel, and an 11 gauge Murphy needle was advanced with CT guidance to the  posterior cortex. Manual forced was used to advance the needle through the posterior cortex and the stylet was removed. A bone marrow aspirate was retrieved and passed to a cytotechnologist in the room. The initial aspirate was negative for visualized spicules/bone particles. Secondary aspirate was performed with heparin. The Murphy needle was then advanced without the stylet for a core biopsy. The core biopsy was retrieved and also passed to a cytotechnologist. A second core biopsy was achieved given the absence of bone particles. Manual pressure was used for hemostasis and a sterile dressing was placed. No complications were encountered no significant blood loss was encountered. Patient tolerated the procedure well and remained hemodynamically stable throughout. IMPRESSION: Status post CT-guided bone marrow biopsy, with tissue specimen sent to pathology for complete histopathologic analysis Signed, Dulcy Fanny. Earleen Newport, DO [facility] Electronically Signed   By: Corrie Mckusick D.O.   On: 06/16/2018 12:03     Medications:   . sodium chloride 125 mL/hr at 06/17/18 0643   . denosumab  120 mg Subcutaneous Once  . feeding supplement (ENSURE ENLIVE)  237 mL Oral BID BM  . folic acid  1 mg Oral Daily  . furosemide  40 mg Intravenous Q6H   . lidocaine  1 patch Transdermal Q24H  . sodium bicarbonate  650 mg Oral BID   HYDROcodone-acetaminophen  Assessment/ Plan:   Acute kidney injury with paraproteinemia and multiple myeloma bone marrow biopsy results pending.  FISH study pending.  Do not see need for renal biopsy.  Patient to start chemotherapy CyBorDex 06/17/2018.  Appreciate the assistance of Dr. Delton Coombes.  Continue to avoid use nonsteroidal inflammatories ACE inhibitors ARB use IV contrast.  Creatinine continues to slowly drift down.  This is a hopeful sign patient does not require dialysis at this point  Hypercalcemia.  Calcium slowly improving now receiving calcitonin per oncology.  Continues on saline and Lasix diuresis.   Denosumab is being ordered by oncology.  Dexamethasone should also help with the hypercalcemia.  We will continue to follow calcium.  Hyponatremia pseudohyponatremia from paraproteinemia.  Metabolic acidosis have decreased sodium bicarbonate 650 mg twice daily   LOS: 5 Sherril Croon @TODAY @9 :37 AM

## 2018-06-17 NOTE — Consult Note (Signed)
[facility] Oncology Progress Note  Name: [name]      MRN: [id number]    Location: W237/S283-15  Date: 06/17/2018 Time:4:21 PM   Subjective: Interval History:[name] is lying in bed.  He is having some back pain.  Denies any nausea or vomiting.  Continues to feel thirsty.  Does not have good appetite.  Denies any falls.  Daughter present at bedside.  Objective: Vital signs in last 24 hours: Temp:  [97.8 F (36.6 C)-98.1 F (36.7 C)] 97.8 F (36.6 C) (03/19 1443) Pulse Rate:  [89-109] 93 (03/19 1443) Resp:  [16-18] 18 (03/19 1443) BP: (107-133)/(51-80) 133/72 (03/19 1443) SpO2:  [96 %-99 %] 97 % (03/19 1443)    Intake/Output from previous day: 03/18 0800 - 03/19 0759 In: 340 [P.O.:340] Out: 3575 [Urine:3575]    Intake/Output this shift: Total I/O In: 399 [I.V.:48.4; IV Piggyback:350.6] Out: 600 [Urine:600]   PHYSICAL EXAM: BP 122/70 (BP Location: Right Arm)   Pulse (!) 103   Temp 98.3 F (36.8 C) (Oral)   Resp 16   Ht 6' (1.829 m)   Wt 170 lb (77.1 kg)   SpO2 96%   BMI 23.06 kg/m  General appearance: alert, cooperative and appears stated age Lungs: clear to auscultation bilaterally Heart: regular rate and rhythm Abdomen: soft, non-tender; bowel sounds normal; no masses,  no organomegaly Extremities: extremities normal, atraumatic, no cyanosis or edema Skin: Skin color, texture, turgor normal. No rashes or lesions Lymph nodes: Cervical, supraclavicular, and axillary nodes normal. Neurologic: Grossly normal   Studies/Results: Results for orders placed or performed during the hospital encounter of 06/12/18 (from the past 48 hour(s))  Renal function panel     Status: Abnormal   Collection Time: 06/16/18  5:03 AM  Result Value Ref Range   Sodium 134 (L) 135 - 145 mmol/L   Potassium 4.3 3.5 - 5.1 mmol/L   Chloride 99 98 - 111 mmol/L   CO2 21 (L) 22 - 32 mmol/L   Glucose, Bld 98 70 - 99 mg/dL   BUN 57 (H) 6 - 20 mg/dL   Creatinine, Ser  4.50 (H) 0.61 - 1.24 mg/dL   Calcium 12.0 (H) 8.9 - 10.3 mg/dL   Phosphorus 6.2 (H) 2.5 - 4.6 mg/dL   Albumin 3.7 3.5 - 5.0 g/dL   GFR calc non Af Amer 13 (L) >60 mL/min   GFR calc Af Amer 15 (L) >60 mL/min   Anion gap 14 5 - 15    Comment: Performed at [facility], [address] [place], [place] [address]  CBC with Differential/Platelet     Status: Abnormal   Collection Time: 06/16/18  5:03 AM  Result Value Ref Range   WBC 11.0 (H) 4.0 - 10.5 K/uL   RBC 2.84 (L) 4.22 - 5.81 MIL/uL   Hemoglobin 9.4 (L) 13.0 - 17.0 g/dL   HCT 28.7 (L) 39.0 - 52.0 %   MCV 101.1 (H) 80.0 - 100.0 fL   MCH 33.1 26.0 - 34.0 pg   MCHC 32.8 30.0 - 36.0 g/dL   RDW 19.6 (H) 11.5 - 15.5 %   Platelets 158 150 - 400 K/uL   nRBC 2.3 (H) 0.0 - 0.2 %   Neutrophils Relative % 39 %   Neutro Abs 4.3 1.7 - 7.7 K/uL   Lymphocytes Relative 39 %   Lymphs Abs 4.4 (H) 0.7 - 4.0 K/uL   Monocytes Relative 17 %   Monocytes Absolute 1.8 (H) 0.1 - 1.0 K/uL   Eosinophils Relative  1 %   Eosinophils Absolute 0.1 0.0 - 0.5 K/uL   Basophils Relative 1 %   Basophils Absolute 0.1 0.0 - 0.1 K/uL   Immature Granulocytes 3 %   Abs Immature Granulocytes 0.31 (H) 0.00 - 0.07 K/uL    Comment: Performed at [facility], [address] [place], [place] [address]  Renal function panel     Status: Abnormal   Collection Time: 06/17/18  4:24 AM  Result Value Ref Range   Sodium 135 135 - 145 mmol/L   Potassium 4.1 3.5 - 5.1 mmol/L   Chloride 99 98 - 111 mmol/L   CO2 22 22 - 32 mmol/L   Glucose, Bld 84 70 - 99 mg/dL   BUN 58 (H) 6 - 20 mg/dL   Creatinine, Ser 4.30 (H) 0.61 - 1.24 mg/dL   Calcium 11.6 (H) 8.9 - 10.3 mg/dL   Phosphorus 6.3 (H) 2.5 - 4.6 mg/dL   Albumin 3.6 3.5 - 5.0 g/dL   GFR calc non Af Amer 14 (L) >60 mL/min   GFR calc Af Amer 16 (L) >60 mL/min   Anion gap 14 5 - 15    Comment: Performed at [facility], [address], [place], [place] [address]  CBC with Differential/Platelet     Status: Abnormal   Collection  Time: 06/17/18  4:24 AM  Result Value Ref Range   WBC 9.6 4.0 - 10.5 K/uL   RBC 2.76 (L) 4.22 - 5.81 MIL/uL   Hemoglobin 9.4 (L) 13.0 - 17.0 g/dL   HCT 27.7 (L) 39.0 - 52.0 %   MCV 100.4 (H) 80.0 - 100.0 fL   MCH 34.1 (H) 26.0 - 34.0 pg   MCHC 33.9 30.0 - 36.0 g/dL   RDW 18.9 (H) 11.5 - 15.5 %   Platelets 155 150 - 400 K/uL   nRBC 2.2 (H) 0.0 - 0.2 %   Neutrophils Relative % 38 %   Neutro Abs 3.7 1.7 - 7.7 K/uL   Lymphocytes Relative 35 %   Lymphs Abs 3.4 0.7 - 4.0 K/uL   Monocytes Relative 22 %   Monocytes Absolute 2.1 (H) 0.1 - 1.0 K/uL   Eosinophils Relative 1 %   Eosinophils Absolute 0.1 0.0 - 0.5 K/uL   Basophils Relative 1 %   Basophils Absolute 0.1 0.0 - 0.1 K/uL   Immature Granulocytes 3 %   Abs Immature Granulocytes 0.26 (H) 0.00 - 0.07 K/uL   Reactive, Benign Lymphocytes PRESENT     Comment: Performed at [facility], [address], [place], [place] [address]  Comprehensive metabolic panel     Status: Abnormal   Collection Time: 06/17/18  4:24 AM  Result Value Ref Range   Sodium 134 (L) 135 - 145 mmol/L   Potassium 4.1 3.5 - 5.1 mmol/L   Chloride 97 (L) 98 - 111 mmol/L   CO2 22 22 - 32 mmol/L   Glucose, Bld 85 70 - 99 mg/dL   BUN 58 (H) 6 - 20 mg/dL   Creatinine, Ser 4.33 (H) 0.61 - 1.24 mg/dL   Calcium 11.6 (H) 8.9 - 10.3 mg/dL   Total Protein 6.6 6.5 - 8.1 g/dL   Albumin 3.7 3.5 - 5.0 g/dL   AST 20 15 - 41 U/L   ALT 18 0 - 44 U/L   Alkaline Phosphatase 71 38 - 126 U/L   Total Bilirubin 0.5 0.3 - 1.2 mg/dL   GFR calc non Af Amer 14 (L) >60 mL/min   GFR calc Af Wyvonnia Lora  16 (L) >60 mL/min   Anion gap 15 5 - 15    Comment: Performed at [facility], [address] [place], [place] [address]   Ct Biopsy  Result Date: 06/16/2018 INDICATION: 58 year old male referred for bone marrow biopsy with history of possible multiple myeloma EXAM: CT BONE MARROW BIOPSY AND ASPIRATION; CT BIOPSY MEDICATIONS: None. ANESTHESIA/SEDATION: Moderate (conscious) sedation was employed  during this procedure. A total of Versed 3.0 mg and Fentanyl 100 mcg was administered intravenously. Moderate Sedation Time: 10 minutes. The patient's level of consciousness and vital signs were monitored continuously by radiology nursing throughout the procedure under my direct supervision. FLUOROSCOPY TIME:  CT COMPLICATIONS: None PROCEDURE: The procedure risks, benefits, and alternatives were explained to the patient. Questions regarding the procedure were encouraged and answered. The patient understands and consents to the procedure. Scout CT of the pelvis was performed for surgical planning purposes. The posterior pelvis was prepped with Chlorhexidine in a sterile fashion, and a sterile drape was applied covering the operative field. A sterile gown and sterile gloves were used for the procedure. Local anesthesia was provided with 1% Lidocaine. Posterior left iliac bone was targeted for biopsy. The skin and subcutaneous tissues were infiltrated with 1% lidocaine without epinephrine. A small stab incision was made with an 11 blade scalpel, and an 11 gauge Murphy needle was advanced with CT guidance to the posterior cortex. Manual forced was used to advance the needle through the posterior cortex and the stylet was removed. A bone marrow aspirate was retrieved and passed to a cytotechnologist in the room. The initial aspirate was negative for visualized spicules/bone particles. Secondary aspirate was performed with heparin. The Murphy needle was then advanced without the stylet for a core biopsy. The core biopsy was retrieved and also passed to a cytotechnologist. A second core biopsy was achieved given the absence of bone particles. Manual pressure was used for hemostasis and a sterile dressing was placed. No complications were encountered no significant blood loss was encountered. Patient tolerated the procedure well and remained hemodynamically stable throughout. IMPRESSION: Status post CT-guided bone marrow  biopsy, with tissue specimen sent to pathology for complete histopathologic analysis Signed, Dulcy Fanny. Earleen Newport, DO [facility] Radiology Electronically Signed   By: Corrie Mckusick D.O.   On: 06/16/2018 12:03   Ct Bone Marrow Biopsy & Aspiration  Result Date: 06/16/2018 INDICATION: 58 year old male referred for bone marrow biopsy with history of possible multiple myeloma EXAM: CT BONE MARROW BIOPSY AND ASPIRATION; CT BIOPSY MEDICATIONS: None. ANESTHESIA/SEDATION: Moderate (conscious) sedation was employed during this procedure. A total of Versed 3.0 mg and Fentanyl 100 mcg was administered intravenously. Moderate Sedation Time: 10 minutes. The patient's level of consciousness and vital signs were monitored continuously by radiology nursing throughout the procedure under my direct supervision. FLUOROSCOPY TIME:  CT COMPLICATIONS: None PROCEDURE: The procedure risks, benefits, and alternatives were explained to the patient. Questions regarding the procedure were encouraged and answered. The patient understands and consents to the procedure. Scout CT of the pelvis was performed for surgical planning purposes. The posterior pelvis was prepped with Chlorhexidine in a sterile fashion, and a sterile drape was applied covering the operative field. A sterile gown and sterile gloves were used for the procedure. Local anesthesia was provided with 1% Lidocaine. Posterior left iliac bone was targeted for biopsy. The skin and subcutaneous tissues were infiltrated with 1% lidocaine without epinephrine. A small stab incision was made with an 11 blade scalpel, and an 11  gauge Murphy needle was advanced with CT guidance to the posterior cortex. Manual forced was used to advance the needle through the posterior cortex and the stylet was removed. A bone marrow aspirate was retrieved and passed to a cytotechnologist in the room. The initial aspirate was negative for visualized  spicules/bone particles. Secondary aspirate was performed with heparin. The Murphy needle was then advanced without the stylet for a core biopsy. The core biopsy was retrieved and also passed to a cytotechnologist. A second core biopsy was achieved given the absence of bone particles. Manual pressure was used for hemostasis and a sterile dressing was placed. No complications were encountered no significant blood loss was encountered. Patient tolerated the procedure well and remained hemodynamically stable throughout. IMPRESSION: Status post CT-guided bone marrow biopsy, with tissue specimen sent to pathology for complete histopathologic analysis Signed, Dulcy Fanny. Earleen Newport, DO [facility] Radiology Electronically Signed   By: Corrie Mckusick D.O.   On: 06/16/2018 12:03     MEDICATIONS: I have reviewed the patient's current medications.     Assessment/Plan:  1.  Kappa light chain myeloma: -SPEP with 0.4 g of M spike.  Immunofixation shows monoclonal free kappa light chain with no heavy chain detected.  Kappa light chains elevated at 12,085 and free light chain ratio of 1981.  Urine immunofixation consistent with Bence-Jones protein, kappa type. - Received day 1 of cyclophosphamide, bortezomib and dexamethasone today.  Next treatment will be next week. -Creatinine is continuing to improve. - We are waiting for bone marrow biopsy results and myeloma FISH panel. - Will consider adding acyclovir after renal function stabilization.  2.  Hypercalcemia: - Etiology is from myeloma. -Calcium today improved 11.6.  Creatinine is 4.3. - He is continuing calcitonin twice daily. -He received first dose of denosumab this morning. -Appreciate renal input from Dr. Justin Mend.  3.  Macrocytic anemia: - He will continue folic acid supplements. -Hemoglobin today is 9.4 with MCV of 100.4.   All questions were answered. The patient knows to call the clinic with any problems,  questions or concerns. We can certainly see the patient much sooner if necessary.    Derek Jack

## 2018-06-17 NOTE — Clinical Social Work Note (Signed)
LCSW spoke with daughter, [name], and discussed that due to the current climate multiple providers were not accepting patient's that multiple providers would have to be contacted in order to find an accepting provider. LCSW discussed that if patient were to need HH at discharge, he would have to have a documented PCP. [name] stated that she would notify patient of this and agreed for LCSW to contact multiple providers in an effort to find a PCP willing to accept.     Ilyse Tremain, Clydene Pugh, LCSW

## 2018-06-17 NOTE — Clinical Social Work Note (Signed)
Patient advised that Dr. Josephine Cables office has agreed to accept patient and an appointment has been scheduled. Patient indicated that he would give it a try but he was not making any promises.      Mittie Knittel, Clydene Pugh, LCSW

## 2018-06-17 NOTE — Progress Notes (Signed)
Patient arrived today from 300 (inpatient) side. Here for day 1 of treatment today.  Education and consent done by Anastasio Champion RN.   Treatment given per orders. Patient tolerated it well without problems. Vitals stable and discharged home from clinic back to room 324 Follow up as scheduled.

## 2018-06-17 NOTE — Consult Note (Signed)
Consultation Note Date: 06/17/2018   Patient Name: [name]  DOB: [birth year]  MRN: [id number]  Age / Sex: 58 y.o., male  PCP: Patient, No Pcp Per Referring Physician: Roxan Hockey, MD  Reason for Consultation: Establishing goals of care  HPI/Patient Profile: 58 y.o. male  with past medical history of chronic back pain, arthritis, macrocytic anemia, BPH, h/o testicular cancer, panlobular emphysema, h/o chronic pain medication use admitted on 06/12/2018 with worsening back pain and weakness and found to have renal failure r/t kappa light chain myeloma. Renal, oncology following.   Clinical Assessment and Goals of Care: I met today with [name] and his daughter at bedside. [name] is very animated in conversation but very frustrated with his back pain. This has been ongoing pain that [name] has had for years and has been to a pain clinic but continues with little relief. [name] speaks fluently from one metaphor to the next and appears to be telling stories from his life indicating his motivation to stay sober. I have a difficult time even being able to speak. [name] also seems that [name] can easily become agitated if someone were to tell him something [name] did not want to hear.   I struggle to elicit goals of care and [name] almost completely avoids his most urgent medical issues with myeloma and renal failure but focus is on his back pain. I has able to get him to say that [name] intends to get better and will go from one doctor to the next until they can figure out an answer to reverse and improve his conditions. His daughter confirms this goal. This is the most that I am able to elicit from him at this time.   Primary Decision Maker PATIENT     SUMMARY OF RECOMMENDATIONS   - Full aggressive care desired  Code Status/Advance Care Planning:  Full code   Symptom Management:   Acute on chronic back pain with h/o  accident with chronic spinal stenosis and lumbar rediculopathy: MRI shows right prevertebral and epidural soft tissue at L2-3 concerning for tumor infection with right L3 nerve impingment, L4-5 mod canal stenosis, and mod-severe L2-3 narrowing. L2 nerve block done 05/27/18 with little relief per patient.   Recommend f/u pain clinic.  Recommend trial of decadron 6 BID. May titrate down if any pain relief.   Continue Lidoderm, Xgeva.   Noted allergy to [place] and Cymbalta.   Continue OxyIR prn - titrate for relief but mindful of who will manage this pain clinic vs oncology.   Bowel Regimen in place with Senokot-S.   Palliative Prophylaxis:   Bowel Regimen, Delirium Protocol, Frequent Pain Assessment and Turn Reposition  Additional Recommendations (Limitations, Scope, Preferences):  Full Scope Treatment  Psycho-social/Spiritual:   Desire for further Chaplaincy support:no  Additional Recommendations: Caregiving  Support/Resources  Prognosis:   Unable to determine - overall poor prognosis.   Discharge Planning: To Be Determined      Primary Diagnoses: Present on Admission: . AKI (acute kidney injury) ([place]) . PNA (pneumonia) .  Chronic left-sided low back pain with left-sided sciatica . Personality disorder ([place]) . Substance abuse ([place]) . Panlobular emphysema ([place]) . Hypercalcemia . Macrocytic anemia . Tobacco abuse . Left lumbar radiculopathy   I have reviewed the medical record, interviewed the patient and family, and examined the patient. The following aspects are pertinent.  Past Medical History:  Diagnosis Date  . Allergy   . Arthritis    neck and back  . Blood transfusion without reported diagnosis   . BPH (benign prostatic hyperplasia)   . Cancer [facility]) 2004   testicle  . Chronic kidney disease    kidney stones  . Left lumbar radiculopathy 06/12/2016  . 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 Medical history non-contributory    Pt has scattered thoughts and  uncertain of past medical history  . Panlobular emphysema ([place]) 05/28/2016  . Stones, urinary tract   . Substance abuse ([place])    prescribed oxydcodone- 10 years   Social History   Socioeconomic History  . Marital status: Single    Spouse name: Not on file  . Number of children: 1  . Years of education: 69  . Highest education level: Not on file  Occupational History  . Occupation: retired    Comment: Psychologist, prison and probation services  . Occupation: disabled  Social Needs  . Financial resource strain: Not on file  . Food insecurity:    Worry: Not on file    Inability: Not on file  . Transportation needs:    Medical: Not on file    Non-medical: Not on file  Tobacco Use  . Smoking status: Current Every Day Smoker    Packs/day: 1.00    Years: 40.00    Pack years: 40.00    Types: Cigarettes    Start date: 03/31/1974  . Smokeless tobacco: Never Used  Substance and Sexual Activity  . Alcohol use: Yes    Comment: Occasional  . Drug use: No    Types: Cocaine    Comment: Remote hx of cocaine, quit 1989  . Sexual activity: Yes  Lifestyle  . Physical activity:    Days per week: Not on file    Minutes per session: Not on file  . Stress: Not on file  Relationships  . Social connections:    Talks on phone: Not on file    Gets together: Not on file    Attends religious service: Not on file    Active member of club or organization: Not on file    Attends meetings of clubs or organizations: Not on file    Relationship status: Not on file  Other Topics Concern  . Not on file  Social History Narrative   [employer] for 12 years   [employer] for 67 years      Never married   One daughter      Lives alone   Retail banker   Right-handed   Occasional caffeine use         Family History  Problem Relation Age of Onset  . COPD Mother   . Heart disease Mother   . Other Father        Never knew his father.  . Thyroid disease Sister   . Arthritis Sister   . Heart disease Sister         bypass  . Colon cancer Neg Hx    Scheduled Meds: . feeding supplement (ENSURE ENLIVE)  237 mL Oral BID BM  . folic acid  1 mg  Oral Daily  . furosemide  40 mg Intravenous Q6H  . lidocaine  1 patch Transdermal Q24H  . sodium bicarbonate  650 mg Oral BID   Continuous Infusions: . sodium chloride 125 mL/hr at 06/17/18 0643   PRN Meds:.HYDROcodone-acetaminophen Allergies  Allergen Reactions  . Acetaminophen Nausea Only and Other (See Comments)    Elevated liver enzymes  . Cymbalta [Duloxetine Hcl] Swelling    Facial swelling  . Diclofenac Sodium Swelling       . Imodium [Loperamide] Swelling    facial  . Lyrica [Pregabalin] Swelling  . Morphine Other (See Comments)    Bradycardia   . Vioxx [Rofecoxib] Swelling  . Aleve [Naproxen] Swelling    eye   Review of Systems  Constitutional: Positive for activity change, appetite change and unexpected weight change.  Respiratory: Negative for shortness of breath.   Gastrointestinal: Positive for constipation.  Musculoskeletal: Positive for back pain.  Neurological: Positive for weakness.    Physical Exam Vitals signs and nursing note reviewed.  Cardiovascular:     Rate and Rhythm: Normal rate and regular rhythm.  Pulmonary:     Effort: Pulmonary effort is normal. No tachypnea, accessory muscle usage or respiratory distress.  Abdominal:     General: Abdomen is flat.  Neurological:     Mental Status: [name] is alert and oriented to person, place, and time.  Psychiatric:        Speech: Speech is tangential.        Behavior: Behavior is hyperactive.        Judgment: Judgment is impulsive.     Vital Signs: BP 122/70 (BP Location: Right Arm)   Pulse (!) 103   Temp 98.3 F (36.8 C) (Oral)   Resp 16   Ht 6' (1.829 m)   Wt 77.1 kg   SpO2 96%   BMI 23.06 kg/m  Pain Scale: 0-10   Pain Score: 10-Worst pain ever   SpO2: SpO2: 96 % O2 Device:SpO2: 96 % O2 Flow Rate: .O2 Flow Rate (L/min): 0 L/min  IO: Intake/output  summary:   Intake/Output Summary (Last 24 hours) at 06/17/2018 1448 Last data filed at 06/17/2018 1246 Gross per 24 hour  Intake 638.96 ml  Output 2900 ml  Net -2261.04 ml    LBM: Last BM Date: 06/15/18 Baseline Weight: Weight: 79 kg(Simultaneous filing. User may not have seen previous data.) Most recent weight: Weight: 77.1 kg     Palliative Assessment/Data:   Flowsheet Rows     Most Recent Value  Intake Tab  Referral Department  Hospitalist  Unit at Time of Referral  Med/Surg Unit  Palliative Care Primary Diagnosis  Cancer  Date Notified  06/16/18  Palliative Care Type  New Palliative care  Reason for referral  Clarify Goals of Care  Date of Admission  06/12/18  # of days IP prior to Palliative referral  4  Clinical Assessment  Psychosocial & Spiritual Assessment  Palliative Care Outcomes      Time Total: 60 min  Greater than 50%  of this time was spent counseling and coordinating care related to the above assessment and plan.  Signed by: Vinie Sill, NP Palliative Medicine Team Pager # [phone] (M-F 8a-5p) Team Phone # [phone] (Nights/Weekends)

## 2018-06-18 LAB — RENAL FUNCTION PANEL
Albumin: 3.7 g/dL (ref 3.5–5.0)
Anion gap: 15 (ref 5–15)
BUN: 68 mg/dL — ABNORMAL HIGH (ref 6–20)
CO2: 22 mmol/L (ref 22–32)
Calcium: 10.7 mg/dL — ABNORMAL HIGH (ref 8.9–10.3)
Chloride: 97 mmol/L — ABNORMAL LOW (ref 98–111)
Creatinine, Ser: 4.46 mg/dL — ABNORMAL HIGH (ref 0.61–1.24)
GFR calc Af Amer: 16 mL/min — ABNORMAL LOW (ref >60)
GFR calc non Af Amer: 13 mL/min — ABNORMAL LOW (ref >60)
Glucose, Bld: 132 mg/dL — ABNORMAL HIGH (ref 70–99)
Phosphorus: 6.4 mg/dL — ABNORMAL HIGH (ref 2.5–4.6)
Potassium: 4 mmol/L (ref 3.5–5.1)
Sodium: 134 mmol/L — ABNORMAL LOW (ref 135–145)

## 2018-06-18 LAB — IMMUNOFIXATION ELECTROPHORESIS
IgA: 14 mg/dL — ABNORMAL LOW (ref 90–386)
IgG (Immunoglobin G), Serum: 421 mg/dL — ABNORMAL LOW (ref 700–1600)
IgM (Immunoglobulin M), Srm: <5 mg/dL — ABNORMAL LOW (ref 20–172)
Total Protein ELP: 6.2 g/dL (ref 6.0–8.5)

## 2018-06-18 LAB — BETA 2 MICROGLOBULIN, SERUM: Beta-2 Microglobulin: 32.8 mg/L — ABNORMAL HIGH (ref 0.6–2.4)

## 2018-06-18 MED ORDER — OXYCODONE-ACETAMINOPHEN 7.5-325 MG PO TABS
1.0000 | ORAL_TABLET | ORAL | Status: DC | PRN
  Administered 2018-06-18: 1 via ORAL
  Administered 2018-06-19 – 2018-06-23 (×13): 2 via ORAL
  Filled 2018-06-18 (×14): qty 2

## 2018-06-18 MED ORDER — POLYETHYLENE GLYCOL 3350 17 G PO PACK
17.0000 g | PACK | Freq: Once | ORAL | Status: DC
  Filled 2018-06-18: qty 1

## 2018-06-18 NOTE — Progress Notes (Signed)
Patient ID: Clemons Salvucci, male   DOB: 1960-05-17, 58 y.o.   MRN: [id number] [facility] Progress Note   Assessment/ Plan:   1. Acute kidney Injury: Most likely myeloma associated renal injury versus acute injury from hypercalcemia.  He is nonoliguric and renal function appears unchanged overnight with ongoing intravenous fluids and furosemide.  Decreased urine output noted on second shift yesterday however, patient reports that this was likely due to inaccurate collection/being off the floor.  I will continue intravenous fluids for forceful diuresis and discontinue furosemide after a single dose today (theoretical risk for increased intratubular light chain precipitation with ongoing diuretics).  No indications for renal replacement therapy at this time.  Continue to avoid nonsteroidal anti-inflammatory drugs and iodinated intravenous contrast. 2.  Hypercalcemia: Calcium level improving status post calcitonin and ongoing denosumab.  Discontinue furosemide, continue maintenance intravenous fluids. 3.  Kappa light chain multiple myeloma: Status post cyclophosphamide, bortezomib and dexamethasone with next treatment due next week.  Bone marrow biopsy result/myeloma FISH panel pending to help guide additional management. 4.  Hyponatremia: Mild and secondary to acute kidney injury, monitor with fluids/renal recovery. 5.  Hyperphosphatemia: Secondary to acute kidney injury, no clinical benefit with management using phosphorus binder at this level of hyperphosphatemia.  Continue to monitor status post denosumab. 6.  Anemia: Secondary to multiple myeloma, with low iron saturation and elevated ferritin noted.  ESA/iron therapy per hematology.  Subjective:   Several nonclinical complaints.  Reports some intermittent discomfort with his neck and back overnight.  Denies any chest pain, shortness of breath or leg swelling.   Objective:   BP 122/70 (BP Location: Right Arm)   Pulse (!) 103   Temp  98.3 F (36.8 C) (Oral)   Resp 16   Ht 6' (1.829 m)   Wt 77.1 kg   SpO2 96%   BMI 23.06 kg/m   Intake/Output Summary (Last 24 hours) at 06/18/2018 0854 Last data filed at 06/18/2018 0232 Gross per 24 hour  Intake 638.96 ml  Output 1350 ml  Net -711.04 ml   Weight change:   Physical Exam: Gen: Comfortably resting in bed CVS: Pulse regular tachycardia, S1 and S2 normal, no murmur/rub Resp: Clear to auscultation bilaterally, no rales or rhonchi Abd: Soft, flat, nontender Ext: Without lower extremity edema  Imaging: Ct Biopsy  Result Date: 06/16/2018 INDICATION: 58 year old male referred for bone marrow biopsy with history of possible multiple myeloma EXAM: CT BONE MARROW BIOPSY AND ASPIRATION; CT BIOPSY MEDICATIONS: None. ANESTHESIA/SEDATION: Moderate (conscious) sedation was employed during this procedure. A total of Versed 3.0 mg and Fentanyl 100 mcg was administered intravenously. Moderate Sedation Time: 10 minutes. The patient's level of consciousness and vital signs were monitored continuously by radiology nursing throughout the procedure under my direct supervision. FLUOROSCOPY TIME:  CT COMPLICATIONS: None PROCEDURE: The procedure risks, benefits, and alternatives were explained to the patient. Questions regarding the procedure were encouraged and answered. The patient understands and consents to the procedure. Scout CT of the pelvis was performed for surgical planning purposes. The posterior pelvis was prepped with Chlorhexidine in a sterile fashion, and a sterile drape was applied covering the operative field. A sterile gown and sterile gloves were used for the procedure. Local anesthesia was provided with 1% Lidocaine. Posterior left iliac bone was targeted for biopsy. The skin and subcutaneous tissues were infiltrated with 1% lidocaine without epinephrine. A small stab incision was made with an 11 blade scalpel, and an 11 gauge Murphy needle was advanced with CT guidance  to the  posterior cortex. Manual forced was used to advance the needle through the posterior cortex and the stylet was removed. A bone marrow aspirate was retrieved and passed to a cytotechnologist in the room. The initial aspirate was negative for visualized spicules/bone particles. Secondary aspirate was performed with heparin. The Murphy needle was then advanced without the stylet for a core biopsy. The core biopsy was retrieved and also passed to a cytotechnologist. A second core biopsy was achieved given the absence of bone particles. Manual pressure was used for hemostasis and a sterile dressing was placed. No complications were encountered no significant blood loss was encountered. Patient tolerated the procedure well and remained hemodynamically stable throughout. IMPRESSION: Status post CT-guided bone marrow biopsy, with tissue specimen sent to pathology for complete histopathologic analysis Signed, Dulcy Fanny. Earleen Newport, DO [facility] Radiology Electronically Signed   By: Corrie Mckusick D.O.   On: 06/16/2018 12:03   Ct Bone Marrow Biopsy & Aspiration  Result Date: 06/16/2018 INDICATION: 58 year old male referred for bone marrow biopsy with history of possible multiple myeloma EXAM: CT BONE MARROW BIOPSY AND ASPIRATION; CT BIOPSY MEDICATIONS: None. ANESTHESIA/SEDATION: Moderate (conscious) sedation was employed during this procedure. A total of Versed 3.0 mg and Fentanyl 100 mcg was administered intravenously. Moderate Sedation Time: 10 minutes. The patient's level of consciousness and vital signs were monitored continuously by radiology nursing throughout the procedure under my direct supervision. FLUOROSCOPY TIME:  CT COMPLICATIONS: None PROCEDURE: The procedure risks, benefits, and alternatives were explained to the patient. Questions regarding the procedure were encouraged and answered. The patient understands and consents to the procedure. Scout CT of the pelvis  was performed for surgical planning purposes. The posterior pelvis was prepped with Chlorhexidine in a sterile fashion, and a sterile drape was applied covering the operative field. A sterile gown and sterile gloves were used for the procedure. Local anesthesia was provided with 1% Lidocaine. Posterior left iliac bone was targeted for biopsy. The skin and subcutaneous tissues were infiltrated with 1% lidocaine without epinephrine. A small stab incision was made with an 11 blade scalpel, and an 11 gauge Murphy needle was advanced with CT guidance to the posterior cortex. Manual forced was used to advance the needle through the posterior cortex and the stylet was removed. A bone marrow aspirate was retrieved and passed to a cytotechnologist in the room. The initial aspirate was negative for visualized spicules/bone particles. Secondary aspirate was performed with heparin. The Murphy needle was then advanced without the stylet for a core biopsy. The core biopsy was retrieved and also passed to a cytotechnologist. A second core biopsy was achieved given the absence of bone particles. Manual pressure was used for hemostasis and a sterile dressing was placed. No complications were encountered no significant blood loss was encountered. Patient tolerated the procedure well and remained hemodynamically stable throughout. IMPRESSION: Status post CT-guided bone marrow biopsy, with tissue specimen sent to pathology for complete histopathologic analysis Signed, Dulcy Fanny. Earleen Newport, DO [facility] Radiology Electronically Signed   By: Corrie Mckusick D.O.   On: 06/16/2018 12:03    Labs: BMET Recent Labs  Lab 06/12/18 2240 06/13/18 7622 06/14/18 0548 06/15/18 0534 06/16/18 0503 06/17/18 0424 06/18/18 0623  NA 131* 134* 135 134* 134* 135  134* 134*  K 4.4 4.5 4.9 4.6 4.3 4.1  4.1 4.0  CL 102 105 108 101 99 99  97* 97*  CO2 16* 16* 16* 18* 21* 22  22 22  GLUCOSE 99 83  82 80 98 84  85 132*  BUN 85* 85* 71* 63* 57* 58*  58* 68*  CREATININE 5.69* 5.48* 5.14* 4.92* 4.50* 4.30*  4.33* 4.46*  CALCIUM 12.2*  12.3* 12.3* 12.2* 12.9* 12.0* 11.6*  11.6* 10.7*  PHOS  --   --   --  6.0* 6.2* 6.3* 6.4*   CBC Recent Labs  Lab 06/14/18 0548 06/15/18 0534 06/16/18 0503 06/17/18 0424  WBC 10.7* 12.0* 11.0* 9.6  NEUTROABS 4.2 3.9 4.3 3.7  HGB 7.0* 9.8* 9.4* 9.4*  HCT 22.5* 29.7* 28.7* 27.7*  MCV 111.4* 100.3* 101.1* 100.4*  PLT 165 150 158 155   Medications:    . feeding supplement (ENSURE ENLIVE)  237 mL Oral BID BM  . folic acid  1 mg Oral Daily  . furosemide  40 mg Intravenous Q6H  . lidocaine  1 patch Transdermal Q24H  . senna-docusate  2 tablet Oral BID  . sodium bicarbonate  650 mg Oral BID   Elmarie Shiley, MD 06/18/2018, 8:54 AM

## 2018-06-18 NOTE — Care Management Important Message (Signed)
Important Message  Patient Details  Name: [name] MRN: [id number] Date of Birth: 06/08/1960   Medicare Important Message Given:  Yes    Tommy Medal 06/18/2018, 12:30 PM

## 2018-06-18 NOTE — Progress Notes (Signed)
Pt c/o pain medication "not touching" his back pain. Notified on call MD to make aware, current Percocet dose increased to 1-2 tabs q 4 hrs.

## 2018-06-18 NOTE — Progress Notes (Signed)
[name] Demographics:    [name], is a 58 y.o. male, DOB - 1960/06/27, MBE:[id number]  Admit date - 06/12/2018   Admitting Physician Phillips Grout, MD  Outpatient Primary MD for the [name] is [name], No Pcp Per  LOS - 6   Chief Complaint  [name] presents with  . Abdominal Pain        Subjective:    [name] today has no fevers, no emesis,  No chest pain, oxycodone apparently more effective for his pain control,  Assessment  & Plan :    Principal Problem:   AKI (acute kidney injury) ([place]) Active Problems:   Tobacco abuse   Substance abuse ([place])   Personality disorder ([place])   Panlobular emphysema ([place])   Chronic left-sided low back pain with left-sided sciatica   Left lumbar radiculopathy   PNA (pneumonia)   Hypercalcemia   Macrocytic anemia   Lumbar epidural mass (H[facility])   Kappa light chain myeloma ([facility])   Brief History: 58 year old male with a history of COPD, testicular cancer, substance abuse, spinal stenosis and lumbar radiculopathy status post fusion 07/24/2015([place])presenting with abdominal pain radiating to the bilateral flanks as well as nausea without any emesis or diarrhea. He denies any fevers, chills, chest pain, shortness of breath, coughing. Unfortunately, [name] is a difficult historian and tends to be tangential. Nevertheless, he has also been complaining of worsening lower back pain and leg weakness. He has not fallen or had any syncope. He has been taking over-the-counter NSAIDs as well as tramadol. [name] had MRI of the lumbar spine on 05/06/2018 which showed abnormal right prevertebral and epidural soft tissue at L2-3 concerning for tumor infection. There is also right L3 nerve impingement. There is moderate L4-5 canal stenosis with moderate to severe L2-3 narrowing. The [name] subsequently had L2 nerve block performed on 05/27/2018. Upon  presentation, the [name] was noted to have hypercalcemia with a corrected calcium of 13.1. The [name] was started on IV fluidswith some improvement of his serum calcium.Subsequently, the [name] was started on calcitonin. Renal and heme/onc were consulted to assist with management.  Assessment/Plan: AKI -Etiology includes with myeloma Vs NSAIDs Vs hypercalcemia--or combination of all 3 -Renal ultrasound--neg hydronephrosis -SPEP--positive M-spike in gamma region -Serum Immunofixation--pending -serum kappa/lambda light chains--pending -urine immunofixation = kappa Bence Jones protein -baseline creatinine ~1.0 -urine protein/creatinine ratio--5.29 -appreciate nephrology input--nephrologist okay to continue IV fluids we will stop IV Lasix for today's dose    Kappa light chain multiple myeloma: Status post cyclophosphamide, bortezomib and dexamethasone with next treatment due next week.  Bone marrow biopsy result/myeloma FISH panel pending to help guide additional management.  Hypercalcemia -Intact PTH--9 -likely due to underlying malignancy--data pointing to myeloma -Continue IV fluids as serum calcium is improving  status post calcitonin and ongoing denosumab-- Lasix  discontinued by his nephrologist as of 06/18/2018 -SPEP--positive M-spike in gamma region -Immunofixation--pending -serum kappa/lambda light chains--pending -urine immunofixation = kappa Bence-Jones protein -heme/onc consult-->Katragadda spoke with IR after I spoke with Dr. Vernard Gambles (IR)-->only wants BM biopsy, cancelled L2-3 biopsy -completed 4 doses calcitonin   Lumbar radiculopathy -05/06/2018 MRI L-spine--diffusely abnormal bone marrow signal concerning for myeloproliferative disorder versus infiltrative metastatic disease. Abnormal prevertebraltissue as discussed above at L2-3 -3/16&3/17--discussed with neurosurgery, Dr. Melynda Keller nonoperative managementfor radiculopathy -  06/15/18--discussed with IR  (Dr. Gaye Pollack for needle bx of L2-3 soft tissue-->placed order in Epic -Dr. Delton Coombes spoke with IR after I spoke with Dr. Vernard Gambles (IR)-->only wants BM biopsy, cancelled L2-3 biopsy -3/16 MR T-spine--no acute findings 3/16 MR L-spine--motion degraded with persistent paracentral soft tissue -06/15/18--personally ambulated pt--pt able to ambulate with minimal to no assist Pain control improving on Percocets,  Bilateral pleural effusions -Echocardiogram--probably normal EF, poor quality -Urine protein creatinine ratio--5.29 -Stable on room air  Proteinuria -pt has nephrotic range proteinuria -has contributed to pleural effusions -urine protein/creatinine ratio 5.29 -nephrology following--appreciated  COPD -Stable on room air -[name] states that he quit smoking 1 month ago  Pulmonary opacity -CT abdomen and pelvis showed consolidation in the posterior left lower lobe -[name] has no fever or shortness of breath -Viral respiratory panel shows rhinovirus -Check procalcitonin--7.71 -CT chest--no consolidation, moderate bilateral pleural effusion with atelectasis. Multiple tiny bilateral nodules. -d/c ceftriaxone and azithromycin-->remains afebrile and hemodynamically stable  Abdominal Pain -improving after BM -3/14 CT abd--mid sigmoid wall thickening withoutsurrounding inflammation--?muscular hypertrophy  Macrocytic anemia--likely due to multiple myeloma-- ?? If needs ESA/iron will defer to hematology and nephrology -V78--588 -folic FOYD--7.4 -iron saturation 35%, ferritin 62 -transfused2 units 06/14/18 -started folate supplementation    Disposition Plan:Not stable for d/c Family Communication:daughter updated at bedside 3/19  Consultants:Renal/heme/onc and IR  Code Status: FULL   DVT Prophylaxis: [place] Heparin    Procedures: As Listed in Progress Note Above  Antibiotics: Ceftriaxone 3/14>>>3/16 azithrommycin  3/14>>>3/16  Disposition/Need for in-Hospital Stay- [name] unable to be discharged at this time due to need for IVF given hypercalcemia, acute kidney injury in the setting of multiple myeloma pending results  Code Status : Full   Family Communication:   Daughter at bedside  DVT Prophylaxis  :   SCDs  Lab Results  Component Value Date   PLT 155 06/17/2018    Inpatient Medications  Scheduled Meds: . feeding supplement (ENSURE ENLIVE)  237 mL Oral BID BM  . folic acid  1 mg Oral Daily  . lidocaine  1 patch Transdermal Q24H  . senna-docusate  2 tablet Oral BID  . sodium bicarbonate  650 mg Oral BID   Continuous Infusions: . sodium chloride 125 mL/hr at 06/18/18 0344   PRN Meds:.oxyCODONE-acetaminophen    Anti-infectives (From admission, onward)   Start     Dose/Rate Route Frequency Ordered Stop   06/12/18 2100  azithromycin (ZITHROMAX) 500 mg in sodium chloride 0.9 % 250 mL IVPB  Status:  Discontinued     500 mg 250 mL/hr over 60 Minutes Intravenous Every 24 hours 06/12/18 2011 06/15/18 0753   06/12/18 2100  cefTRIAXone (ROCEPHIN) 1 g in sodium chloride 0.9 % 100 mL IVPB  Status:  Discontinued     1 g 200 mL/hr over 30 Minutes Intravenous Every 24 hours 06/12/18 2054 06/12/18 2056   06/12/18 2100  azithromycin (ZITHROMAX) 500 mg in sodium chloride 0.9 % 250 mL IVPB  Status:  Discontinued     500 mg 250 mL/hr over 60 Minutes Intravenous Every 24 hours 06/12/18 2054 06/12/18 2056   06/12/18 2030  cefTRIAXone (ROCEPHIN) 1 g in sodium chloride 0.9 % 100 mL IVPB  Status:  Discontinued     1 g 200 mL/hr over 30 Minutes Intravenous Every 24 hours 06/12/18 2011 06/15/18 0753        Objective:   Vitals:   06/15/18 2130 06/16/18 0505 06/16/18 2051 06/17/18 2051  BP: (!) 120/96 122/70    Pulse: Marland Kitchen)  111 (!) 103    Resp: 18 16    Temp: 98 F (36.7 C) 98.3 F (36.8 C)    TempSrc: Oral Oral    SpO2: 97% 97% 96% 96%  Weight:      Height:        Wt Readings from Last 3  Encounters:  06/12/18 77.1 kg  05/29/18 79.4 kg  04/15/18 93 kg     Intake/Output Summary (Last 24 hours) at 06/18/2018 1827 Last data filed at 06/18/2018 1500 Gross per 24 hour  Intake 480 ml  Output 750 ml  Net -270 ml     Physical Exam [name] is examined daily including today on 06/18/18 , exams remain the same as of yesterday except that has changed   Gen:- Awake Alert,  In no apparent distress  HEENT:- [place].AT, No sclera icterus Neck-Supple Neck,No JVD,.  Lungs-  CTAB , fair symmetrical air movement CV- S1, S2 normal, regular  Abd-  +ve B.Sounds, Abd Soft, No tenderness,    Extremity/Skin:- No  edema, pedal pulses present  Psych-affect is appropriate, oriented x3 Neuro-no new focal deficits, no tremors   Data Review:   Micro Results Recent Results (from the past 240 hour(s))  Culture, blood (routine x 2) Call MD if unable to obtain prior to antibiotics being given     Status: None   Collection Time: 06/12/18 10:41 PM  Result Value Ref Range Status   Specimen Description BLOOD RIGHT HAND  Final   Special Requests   Final    BOTTLES DRAWN AEROBIC AND ANAEROBIC Blood Culture adequate volume   Culture   Final    NO GROWTH 5 DAYS Performed at [facility], [address], [place], [place] [address]    Report Status 06/17/2018 FINAL  Final  Respiratory Panel by PCR     Status: Abnormal   Collection Time: 06/12/18 10:44 PM  Result Value Ref Range Status   Adenovirus NOT DETECTED NOT DETECTED Final   Coronavirus 229E NOT DETECTED NOT DETECTED Final    Comment: (NOTE) The Coronavirus on the Respiratory Panel, DOES NOT test for the novel  Coronavirus (2019 nCoV)    Coronavirus HKU1 NOT DETECTED NOT DETECTED Final   Coronavirus NL63 NOT DETECTED NOT DETECTED Final   Coronavirus OC43 NOT DETECTED NOT DETECTED Final   Metapneumovirus NOT DETECTED NOT DETECTED Final   Rhinovirus / Enterovirus DETECTED (A) NOT DETECTED Final   Influenza A NOT DETECTED NOT DETECTED Final    Influenza B NOT DETECTED NOT DETECTED Final   Parainfluenza Virus 1 NOT DETECTED NOT DETECTED Final   Parainfluenza Virus 2 NOT DETECTED NOT DETECTED Final   Parainfluenza Virus 3 NOT DETECTED NOT DETECTED Final   Parainfluenza Virus 4 NOT DETECTED NOT DETECTED Final   Respiratory Syncytial Virus NOT DETECTED NOT DETECTED Final   Bordetella pertussis NOT DETECTED NOT DETECTED Final   Chlamydophila pneumoniae NOT DETECTED NOT DETECTED Final   Mycoplasma pneumoniae NOT DETECTED NOT DETECTED Final    Comment: Performed at [facility], [place] [address], [place], [place] [address]  Culture, blood (routine x 2) Call MD if unable to obtain prior to antibiotics being given     Status: None   Collection Time: 06/12/18 10:58 PM  Result Value Ref Range Status   Specimen Description BLOOD LEFT ARM  Final   Special Requests   Final    BOTTLES DRAWN AEROBIC AND ANAEROBIC Blood Culture adequate volume   Culture   Final    NO  GROWTH 5 DAYS Performed at [facility], [address] [place], [place] [address]    Report Status 06/17/2018 FINAL  Final  MRSA PCR Screening     Status: None   Collection Time: 06/13/18 12:20 AM  Result Value Ref Range Status   MRSA by PCR NEGATIVE NEGATIVE Final    Comment:        The GeneXpert MRSA Assay (FDA approved for NASAL specimens only), is one component of a comprehensive MRSA colonization surveillance program. It is not intended to diagnose MRSA infection nor to guide or monitor treatment for MRSA infections. Performed at [facility], [address] [place], [place] [address]     Radiology Reports Ct Abdomen Pelvis Wo Contrast  Result Date: 06/12/2018 CLINICAL DATA:  Abdominal pain and fever EXAM: CT ABDOMEN AND PELVIS WITHOUT CONTRAST TECHNIQUE: Multidetector CT imaging of the abdomen and pelvis was performed following the standard protocol without IV contrast. Oral contrast was administered. COMPARISON:  Aug 12, 2012 FINDINGS: Lower chest: There  are free-flowing pleural effusions bilaterally with bibasilar atelectasis. There is questionable mild consolidation in the posterior left lung base as well. Hepatobiliary: No focal liver lesions are appreciable on this noncontrast enhanced study. The gallbladder wall is not appreciably thickened. There is no biliary duct dilatation. Pancreas: No pancreatic mass or inflammatory focus. Spleen: No splenic lesions are evident. Adrenals/Urinary Tract: Adrenals bilaterally appear unremarkable. Kidneys bilaterally show no evident mass or hydronephrosis on either side. There is no appreciable renal or ureteral calculus on either side. Urinary bladder is midline with wall thickness within normal limits. Stomach/Bowel: There are sigmoid diverticula without overt diverticulitis. There is wall thickening in portions of the mid sigmoid colon without surrounding inflammation. Suspect muscular hypertrophy in this area from chronic diverticulosis. No bowel wall thickening is noted elsewhere. No bowel obstruction appreciable. No free air or portal venous air. Vascular/Lymphatic: There is aortic and bilateral iliac artery atherosclerosis. No aneurysm evident. There is no appreciable adenopathy in the abdomen or pelvis. Reproductive: Prostate and seminal vesicles appear normal in size and contour. There is no appreciable pelvic mass. Other: Appendix appears normal. No abscess or ascites is evident in the abdomen or pelvis. Musculoskeletal: There is extensive postoperative change from L1-L4. There is arthropathy throughout the lumbar region. There are no blastic or lytic bone lesions. There is no intramuscular or abdominal wall lesion evident. IMPRESSION: 1. Moderate free-flowing pleural effusions bilaterally with bibasilar atelectasis. Question a degree of superimposed pneumonia in the posterior left base. 2. Wall thickening in the mid sigmoid colon, likely due to muscular hypertrophy from chronic diverticulosis. Earliest changes of  diverticulitis in this area are difficult to entirely exclude. No overt diverticulitis is evident. 3. No bowel obstruction. Appendix appears normal. No evident abscess in the abdomen or pelvis. 4. No evident renal or ureteral calculus. No evident hydronephrosis on either side. 5.  Aortoiliac atherosclerosis. 6.  Extensive postoperative change throughout the lumbar spine. Electronically Signed   By: Lowella Grip III M.D.   On: 06/12/2018 17:14   Dg Chest 2 View  Result Date: 06/12/2018 CLINICAL DATA:  Stomach pain for a week. No vomiting or diarrhea. Fever. EXAM: CHEST - 2 VIEW COMPARISON:  CT chest 05/28/2016. Chest 11/04/2010 FINDINGS: Normal heart size and pulmonary vascularity. Small left pleural effusion with infiltration in the left lung base. This suggests pneumonia. Right lung is clear. No pneumothorax. Mediastinal contours appear intact. IMPRESSION: Small left pleural effusion with infiltration in the left lung base suggesting pneumonia. Electronically Signed  By: Lucienne Capers M.D.   On: 06/12/2018 21:18   Ct Chest Wo Contrast  Result Date: 06/14/2018 CLINICAL DATA:  58 year old male with history of chest pain and pleural effusion. EXAM: CT CHEST WITHOUT CONTRAST TECHNIQUE: Multidetector CT imaging of the chest was performed following the standard protocol without IV contrast. COMPARISON:  Chest CT 05/20/2016.  Chest x-ray 06/12/2018. FINDINGS: Cardiovascular: Heart size is normal. There is no significant pericardial fluid, thickening or pericardial calcification. Aortic atherosclerosis. No coronary artery calcifications. Mediastinum/Nodes: No pathologically enlarged mediastinal or hilar lymph nodes. Esophagus is unremarkable in appearance. No axillary lymphadenopathy. Lungs/Pleura: Multiple tiny 2-3 mm pulmonary nodules scattered throughout both lungs, several of which are calcified, compatible with benign granulomas. No larger more suspicious appearing pulmonary nodules or masses are  noted. Moderate-sized bilateral pleural effusions lying dependently. This is associated with some mild passive atelectasis in the lower lobes of the lungs bilaterally. No acute consolidative airspace disease. Diffuse bronchial wall thickening with mild centrilobular and paraseptal emphysema. Upper Abdomen: Aortic atherosclerosis. Musculoskeletal: There are no aggressive appearing lytic or blastic lesions noted in the visualized portions of the skeleton. IMPRESSION: 1. Moderate bilateral pleural effusions lying dependently with areas of passive subsegmental atelectasis in the lower lobes of the lungs bilaterally. 2. Multiple tiny 2-3 mm pulmonary nodules scattered throughout the lungs bilaterally, nonspecific, but similar to prior study from 05/28/2016, considered definitively benign. 3. Aortic atherosclerosis. 4. Diffuse bronchial wall thickening with mild centrilobular and paraseptal emphysema; imaging findings suggestive of underlying COPD. Aortic Atherosclerosis (ICD10-I70.0) and Emphysema (ICD10-J43.9). Electronically Signed   By: Vinnie Langton M.D.   On: 06/14/2018 10:53   Mr Thoracic Spine Wo Contrast  Result Date: 06/14/2018 CLINICAL DATA:  Mid low back pain for 2 months EXAM: MRI THORACIC AND LUMBAR SPINE WITHOUT CONTRAST TECHNIQUE: Multiplanar and multiecho pulse sequences of the thoracic and lumbar spine were obtained without intravenous contrast. COMPARISON:  Lumbar spine MRI 05/06/2018 FINDINGS: Severe [name] motion degrading image quality limiting evaluation. Portion of the examination are nondiagnostic. MRI THORACIC SPINE FINDINGS Alignment:  Physiologic. Vertebrae: No fracture, evidence of discitis, or bone lesion. Cord:  Normal signal and morphology. Paraspinal and other soft tissues: No acute paraspinal abnormality. Disc levels: Disc spaces:  Disc spaces are relatively preserved. C6-7: Mild broad-based disc bulge. No foraminal or central canal stenosis. T1-T2: No disc protrusion, foraminal  stenosis or central canal stenosis. T2-T3: No disc protrusion, foraminal stenosis or central canal stenosis. T3-T4: Broad-based disc bulge. Moderate bilateral facet arthropathy. Mild spinal stenosis. T4-T5: No disc protrusion, foraminal stenosis or central canal stenosis. Bilateral mild facet arthropathy. T5-T6: No disc protrusion, foraminal stenosis or central canal stenosis. T6-T7: No disc protrusion, foraminal stenosis or central canal stenosis. T7-T8: No disc protrusion, foraminal stenosis or central canal stenosis. T8-T9: No disc protrusion, foraminal stenosis or central canal stenosis. T9-T10: No disc protrusion, foraminal stenosis or central canal stenosis. T10-T11: No disc protrusion, foraminal stenosis or central canal stenosis. T11-T12: No disc protrusion, foraminal stenosis or central canal stenosis. MRI LUMBAR SPINE FINDINGS Segmentation:  Standard. Alignment:  Physiologic. Vertebrae: No fracture, evidence of discitis, or aggressive bone lesion. Low marrow signal throughout the lumbar spine which is limited in evaluation secondary to [name] motion. Conus medullaris and cauda equina: Conus extends to the T12 level. Conus and cauda equina appear normal. Paraspinal and other soft tissues: No acute paraspinal abnormality. Disc levels: Disc spaces: Posterior lumbar interbody fusion from L1 through L4. T12-L1: Minimal broad-based disc bulge. Mild bilateral facet arthropathy. No evidence of neural  foraminal stenosis. No central canal stenosis. L1-L2: Interbody fusion. No evidence of neural foraminal stenosis. No central canal stenosis. L2-L3: Interbody fusion. Evaluation is extremely limited secondary to [name] motion degrading image quality. There is suggestion of persistent right central/right paracentral epidural soft tissue, but characterization is extremely difficult for given the degree of [name] motion. Right foraminal stenosis. No central canal stenosis. L3-L4: Interbody fusion. No evidence of neural  foraminal stenosis. No central canal stenosis. L4-L5: Minimal broad-based disc bulge. Mild bilateral facet scratch them moderate bilateral facet arthropathy. Mild left foraminal stenosis. No right foraminal stenosis. No central canal stenosis. L5-S1: Broad-based disc bulge. Moderate bilateral facet arthropathy. Mild left foraminal stenosis. No significant right foraminal stenosis. No central canal stenosis. IMPRESSION: Severe [name] motion degrading image quality limiting evaluation. Portion of the examination are nondiagnostic. MR THORACIC SPINE IMPRESSION 1. No aggressive osseous lesion to suggest malignancy. 2.  No acute osseous injury of the thoracic spine. MR LUMBAR SPINE IMPRESSION 1. L2-3 is extremely limited in evaluation secondary to [name] motion degrading image quality. There is suggestion of persistent right central/right paracentral epidural soft tissue, but characterization is extremely difficult for given the degree of [name] motion. Right foraminal stenosis. Recommend repeat MRI of the lumbar spine when the [name] is able to better tolerate the examination with less [name] motion. 2.  No acute osseous injury of the lumbar spine. Electronically Signed   By: Kathreen Devoid   On: 06/14/2018 19:51   Mr Lumbar Spine Wo Contrast  Result Date: 06/14/2018 CLINICAL DATA:  Mid low back pain for 2 months EXAM: MRI THORACIC AND LUMBAR SPINE WITHOUT CONTRAST TECHNIQUE: Multiplanar and multiecho pulse sequences of the thoracic and lumbar spine were obtained without intravenous contrast. COMPARISON:  Lumbar spine MRI 05/06/2018 FINDINGS: Severe [name] motion degrading image quality limiting evaluation. Portion of the examination are nondiagnostic. MRI THORACIC SPINE FINDINGS Alignment:  Physiologic. Vertebrae: No fracture, evidence of discitis, or bone lesion. Cord:  Normal signal and morphology. Paraspinal and other soft tissues: No acute paraspinal abnormality. Disc levels: Disc spaces:  Disc spaces are  relatively preserved. C6-7: Mild broad-based disc bulge. No foraminal or central canal stenosis. T1-T2: No disc protrusion, foraminal stenosis or central canal stenosis. T2-T3: No disc protrusion, foraminal stenosis or central canal stenosis. T3-T4: Broad-based disc bulge. Moderate bilateral facet arthropathy. Mild spinal stenosis. T4-T5: No disc protrusion, foraminal stenosis or central canal stenosis. Bilateral mild facet arthropathy. T5-T6: No disc protrusion, foraminal stenosis or central canal stenosis. T6-T7: No disc protrusion, foraminal stenosis or central canal stenosis. T7-T8: No disc protrusion, foraminal stenosis or central canal stenosis. T8-T9: No disc protrusion, foraminal stenosis or central canal stenosis. T9-T10: No disc protrusion, foraminal stenosis or central canal stenosis. T10-T11: No disc protrusion, foraminal stenosis or central canal stenosis. T11-T12: No disc protrusion, foraminal stenosis or central canal stenosis. MRI LUMBAR SPINE FINDINGS Segmentation:  Standard. Alignment:  Physiologic. Vertebrae: No fracture, evidence of discitis, or aggressive bone lesion. Low marrow signal throughout the lumbar spine which is limited in evaluation secondary to [name] motion. Conus medullaris and cauda equina: Conus extends to the T12 level. Conus and cauda equina appear normal. Paraspinal and other soft tissues: No acute paraspinal abnormality. Disc levels: Disc spaces: Posterior lumbar interbody fusion from L1 through L4. T12-L1: Minimal broad-based disc bulge. Mild bilateral facet arthropathy. No evidence of neural foraminal stenosis. No central canal stenosis. L1-L2: Interbody fusion. No evidence of neural foraminal stenosis. No central canal stenosis. L2-L3: Interbody fusion. Evaluation is extremely limited secondary to [name]  motion degrading image quality. There is suggestion of persistent right central/right paracentral epidural soft tissue, but characterization is extremely difficult for  given the degree of [name] motion. Right foraminal stenosis. No central canal stenosis. L3-L4: Interbody fusion. No evidence of neural foraminal stenosis. No central canal stenosis. L4-L5: Minimal broad-based disc bulge. Mild bilateral facet scratch them moderate bilateral facet arthropathy. Mild left foraminal stenosis. No right foraminal stenosis. No central canal stenosis. L5-S1: Broad-based disc bulge. Moderate bilateral facet arthropathy. Mild left foraminal stenosis. No significant right foraminal stenosis. No central canal stenosis. IMPRESSION: Severe [name] motion degrading image quality limiting evaluation. Portion of the examination are nondiagnostic. MR THORACIC SPINE IMPRESSION 1. No aggressive osseous lesion to suggest malignancy. 2.  No acute osseous injury of the thoracic spine. MR LUMBAR SPINE IMPRESSION 1. L2-3 is extremely limited in evaluation secondary to [name] motion degrading image quality. There is suggestion of persistent right central/right paracentral epidural soft tissue, but characterization is extremely difficult for given the degree of [name] motion. Right foraminal stenosis. Recommend repeat MRI of the lumbar spine when the [name] is able to better tolerate the examination with less [name] motion. 2.  No acute osseous injury of the lumbar spine. Electronically Signed   By: Kathreen Devoid   On: 06/14/2018 19:51   US Renal  Result Date: 06/14/2018 CLINICAL DATA:  Acute on chronic renal failure EXAM: RENAL / URINARY TRACT ULTRASOUND COMPLETE COMPARISON:  06/12/2018 FINDINGS: Right Kidney: Renal measurements: 11.6 x 4.9 x 7.6 cm = volume: 224 mL. Diffuse increased echogenicity is noted. No mass or hydronephrosis is noted. Left Kidney: Renal measurements: 11.9 x 6.5 x 5.5 cm = volume: 222 mL. Increased echogenicity is noted. No mass lesion or hydronephrosis is seen. Bladder: Appears normal for degree of bladder distention. Bilateral pleural effusions are noted similar to that seen  on prior CT. IMPRESSION: Diffuse increased echogenicity consistent with the given clinical history. Bilateral small effusions. Electronically Signed   By: Inez Catalina M.D.   On: 06/14/2018 10:30   Ct Biopsy  Result Date: 06/16/2018 INDICATION: 58 year old male referred for bone marrow biopsy with history of possible multiple myeloma EXAM: CT BONE MARROW BIOPSY AND ASPIRATION; CT BIOPSY MEDICATIONS: None. ANESTHESIA/SEDATION: Moderate (conscious) sedation was employed during this procedure. A total of Versed 3.0 mg and Fentanyl 100 mcg was administered intravenously. Moderate Sedation Time: 10 minutes. The [name]'s level of consciousness and vital signs were monitored continuously by radiology nursing throughout the procedure under my direct supervision. FLUOROSCOPY TIME:  CT COMPLICATIONS: None PROCEDURE: The procedure risks, benefits, and alternatives were explained to the [name]. Questions regarding the procedure were encouraged and answered. The [name] understands and consents to the procedure. Scout CT of the pelvis was performed for surgical planning purposes. The posterior pelvis was prepped with Chlorhexidine in a sterile fashion, and a sterile drape was applied covering the operative field. A sterile gown and sterile gloves were used for the procedure. Local anesthesia was provided with 1% Lidocaine. Posterior left iliac bone was targeted for biopsy. The skin and subcutaneous tissues were infiltrated with 1% lidocaine without epinephrine. A small stab incision was made with an 11 blade scalpel, and an 11 gauge Murphy needle was advanced with CT guidance to the posterior cortex. Manual forced was used to advance the needle through the posterior cortex and the stylet was removed. A bone marrow aspirate was retrieved and passed to a cytotechnologist in the room. The initial aspirate was negative for visualized spicules/bone particles. Secondary aspirate was performed  with heparin. The Murphy needle was  then advanced without the stylet for a core biopsy. The core biopsy was retrieved and also passed to a cytotechnologist. A second core biopsy was achieved given the absence of bone particles. Manual pressure was used for hemostasis and a sterile dressing was placed. No complications were encountered no significant blood loss was encountered. [name] tolerated the procedure well and remained hemodynamically stable throughout. IMPRESSION: Status post CT-guided bone marrow biopsy, with tissue specimen sent to pathology for complete histopathologic analysis Signed, Dulcy Fanny. Earleen Newport, DO [facility] Radiology Electronically Signed   By: Corrie Mckusick D.O.   On: 06/16/2018 12:03   Dg Bone Survey Met  Result Date: 06/15/2018 CLINICAL DATA:  58 year old male with a history of hypercalcemia EXAM: METASTATIC BONE SURVEY COMPARISON:  Chest CT 06/14/2018, plain film 06/12/2018, lumbar CT 05/06/2018 FINDINGS: Skull: No acute fracture. No lytic or sclerotic lesion identified.  Endodontal disease. Cervical spine: Surgical changes of anterior cervical discectomy infusion with anterior plate screw fixation F7-P1 with no complicating features. Degenerative changes at the levels above and below. No lytic lesions or sclerotic lesions.  Alignment maintained. Thoracic spine: Vertebral body heights maintained with alignment. Mild disc disease of the thoracic spine. No acute fracture. No lytic or sclerotic lesions. Unremarkable appearance of the pedicles. Lumbar spine: Surgical changes of posterior lumbar interbody fusion with bilateral pedicle screw and rod fixation spanning L1-L4. No complicating features. No acute fracture. The lytic changes that were identified on prior CT are not visualized on the current plain film. Moderate disc disease and facet disease of the lumbar spine. No lytic or sclerotic lesions identified. Chest: Cardiomediastinal silhouette within normal limits. Blunting  of the bilateral costophrenic angles with obscuration of the hemidiaphragms bilaterally. No interlobular septal thickening. No rib fracture identified. Pelvis: No acute fracture. Sclerotic changes at the inferior aspect of the left sacroiliac joint on the iliac aspect, similar to prior CT abdomen. No lytic changes. Degenerative changes of the hips. Right upper extremity: No acute fracture.  No lucent lesion or sclerotic focus. Left upper extremity: No acute fracture.  No lucent lesion or sclerotic changes. Right lower extremity: No acute fracture.  No no lucent lesion or sclerotic lesion. Left lower extremity: No acute fracture.  No lucent lesion or sclerotic lesion. IMPRESSION: Abnormal lucent changes identified in the lumbar spine on the prior CT dated 05/06/2018 are not visualized on the current plain film. If concern for malignancy, correlation with either nuclear medicine bone scan or PET-CT may be useful. Sclerotic focus at the inferior aspect of the left sacral iliac joint, present on prior CT pelvis. If concern for malignancy, correlation with either nuclear medicine bone scan or PET-CT may be useful. Bilateral small pleural effusions, present on prior CT. Electronically Signed   By: Corrie Mckusick D.O.   On: 06/15/2018 09:22   Ct Bone Marrow Biopsy & Aspiration  Result Date: 06/16/2018 INDICATION: 58 year old male referred for bone marrow biopsy with history of possible multiple myeloma EXAM: CT BONE MARROW BIOPSY AND ASPIRATION; CT BIOPSY MEDICATIONS: None. ANESTHESIA/SEDATION: Moderate (conscious) sedation was employed during this procedure. A total of Versed 3.0 mg and Fentanyl 100 mcg was administered intravenously. Moderate Sedation Time: 10 minutes. The [name]'s level of consciousness and vital signs were monitored continuously by radiology nursing throughout the procedure under my direct supervision. FLUOROSCOPY TIME:  CT COMPLICATIONS: None PROCEDURE: The procedure risks, benefits, and  alternatives were explained to the [name]. Questions regarding the procedure were encouraged  and answered. The [name] understands and consents to the procedure. Scout CT of the pelvis was performed for surgical planning purposes. The posterior pelvis was prepped with Chlorhexidine in a sterile fashion, and a sterile drape was applied covering the operative field. A sterile gown and sterile gloves were used for the procedure. Local anesthesia was provided with 1% Lidocaine. Posterior left iliac bone was targeted for biopsy. The skin and subcutaneous tissues were infiltrated with 1% lidocaine without epinephrine. A small stab incision was made with an 11 blade scalpel, and an 11 gauge Murphy needle was advanced with CT guidance to the posterior cortex. Manual forced was used to advance the needle through the posterior cortex and the stylet was removed. A bone marrow aspirate was retrieved and passed to a cytotechnologist in the room. The initial aspirate was negative for visualized spicules/bone particles. Secondary aspirate was performed with heparin. The Murphy needle was then advanced without the stylet for a core biopsy. The core biopsy was retrieved and also passed to a cytotechnologist. A second core biopsy was achieved given the absence of bone particles. Manual pressure was used for hemostasis and a sterile dressing was placed. No complications were encountered no significant blood loss was encountered. [name] tolerated the procedure well and remained hemodynamically stable throughout. IMPRESSION: Status post CT-guided bone marrow biopsy, with tissue specimen sent to pathology for complete histopathologic analysis Signed, Dulcy Fanny. Earleen Newport, DO [facility] Radiology Electronically Signed   By: Corrie Mckusick D.O.   On: 06/16/2018 12:03   Dg Inject Diag/thera/inc Needle/cath/plc Epi/lumb/sac W/img  Result Date: 05/27/2018 CLINICAL DATA:  Previous fusion  surgery. Back and right lower extremity pain. EXAM: SELECTIVE NERVE ROOT BLOCK AND TRANSFORAMINAL EPIDURAL STEROID INJECTION UNDER FLUOROSCOPY FLUOROSCOPY TIME:  43 seconds; 58 uGym2 DAP TECHNIQUE: The procedure, risks (including but not limited to bleeding, infection, organ damage ), benefits, and alternatives were explained to the [name]. Questions regarding the procedure were encouraged and answered. The [name] understands and consents to the procedure. An appropriate skin entry site was determined under fluoroscopy. Operator donned sterile gloves and mask. Site was marked, prepped with Betadine, draped in usual sterile fashion, infiltrated locally with 1% lidocaine. A 22 gauge spinal needle was advanced to the superior ventral margin of the right L2-3 neural foramen. Diagnostic injection of 2 ml Omnipaque 180 showed partial outlining of the exiting nerve root as well as minimal epidural extension of contrast, with no intravascular or subarachnoid component. 120 mg Depo-Medrol in 3 ml lidocaine 1% was administered. The [name] tolerated procedure well, with no immediate complication. IMPRESSION: 1. Technically successful right L2 selective nerve root block and transforaminal epidural steroid injection Electronically Signed   By: Lucrezia Europe M.D.   On: 05/27/2018 15:28     CBC Recent Labs  Lab 06/13/18 0639 06/14/18 0548 06/15/18 0534 06/16/18 0503 06/17/18 0424  WBC 9.6 10.7* 12.0* 11.0* 9.6  HGB 7.3* 7.0* 9.8* 9.4* 9.4*  HCT 22.9* 22.5* 29.7* 28.7* 27.7*  PLT 150 165 150 158 155  MCV 111.2* 111.4* 100.3* 101.1* 100.4*  MCH 35.4* 34.7* 33.1 33.1 34.1*  MCHC 31.9 31.1 33.0 32.8 33.9  RDW 15.6* 15.5 20.1* 19.6* 18.9*  LYMPHSABS 3.8 4.1* 4.9* 4.4* 3.4  MONOABS 1.9* 2.0* 2.7* 1.8* 2.1*  EOSABS 0.1 0.1 0.1 0.1 0.1  BASOSABS 0.0 0.1 0.1 0.1 0.1    Chemistries  Recent Labs  Lab 06/12/18 1325  06/14/18 0548 06/15/18 0534 06/16/18 0503 06/17/18 0424 06/18/18 0623  NA 132*   < >  135 134*  134* 135  134* 134*  K 4.3   < > 4.9 4.6 4.3 4.1  4.1 4.0  CL 98   < > 108 101 99 99  97* 97*  CO2 16*   < > 16* 18* 21* _0 GLUCOSE 101*   < > 82 80 98 84  85 132*  BUN 95*   < > 71* 63* 57* 58*  58* 68*  CREATININE 6.14*   < > 5.14* 4.92* 4.50* 4.30*  4.33* 4.46*  CALCIUM 13.1*   < > 12.2* 12.9* 12.0* 11.6*  11.6* 10.7*  AST 20  --   --  22  --  20  --   ALT 23  --   --  19  --  18  --   ALKPHOS 99  --   --  82  --  71  --   BILITOT 0.6  --   --  0.3  --  0.5  --    < > = values in this interval not displayed.   ------------------------------------------------------------------------------------------------------------------ No results for input(s): CHOL, HDL, LDLCALC, TRIG, CHOLHDL, LDLDIRECT in the last 72 hours.  No results found for: HGBA1C ------------------------------------------------------------------------------------------------------------------ No results for input(s): TSH, T4TOTAL, T3FREE, THYROIDAB in the last 72 hours.  Invalid input(s): FREET3 ------------------------------------------------------------------------------------------------------------------ No results for input(s): VITAMINB12, FOLATE, FERRITIN, TIBC, IRON, RETICCTPCT in the last 72 hours.  Coagulation profile Recent Labs  Lab 06/15/18 1153  INR 1.0    No results for input(s): DDIMER in the last 72 hours.  Cardiac Enzymes No results for input(s): CKMB, TROPONINI, MYOGLOBIN in the last 168 hours.  Invalid input(s): CK ------------------------------------------------------------------------------------------------------------------ No results found for: BNP   Roxan Hockey M.D on 06/18/2018 at 6:27 PM  Go to [email or web address] - for contact info  [facility] - Office  [phone]

## 2018-06-18 NOTE — Consult Note (Signed)
[facility] Oncology Progress Note  Name: [name]      MRN: [id number]    Location: E527/P824-23  Date: 06/18/2018 Time:4:26 PM   Subjective: Interval History:Tawfiq Lucus was seen on rounds today.  He is lying in bed.  He reports diffuse back pains.  Denied any nausea vomiting or diarrhea.  Denies any severe weakness.  Objective: Vital signs in last 24 hours: SpO2:  [96 %] 96 % (03/19 2051)    Intake/Output from previous day: 03/19 0800 - 03/20 0759 In: 639 [P.O.:240; I.V.:48.4] Out: 1950 [Urine:1950]    Intake/Output this shift: Total I/O In: 480 [P.O.:480] Out: 400 [Urine:400]   PHYSICAL EXAM: BP 122/70 (BP Location: Right Arm)   Pulse (!) 103   Temp 98.3 F (36.8 C) (Oral)   Resp 16   Ht 6' (1.829 m)   Wt 170 lb (77.1 kg)   SpO2 96%   BMI 23.06 kg/m  Lungs: clear to auscultation bilaterally Heart: regular rate and rhythm Abdomen: soft, non-tender; bowel sounds normal; no masses,  no organomegaly Extremities: extremities normal, atraumatic, no cyanosis or edema Lymph nodes: Cervical, supraclavicular, and axillary nodes normal. Neurologic: Grossly normal   Studies/Results: Results for orders placed or performed during the hospital encounter of 06/12/18 (from the past 48 hour(s))  Renal function panel     Status: Abnormal   Collection Time: 06/17/18  4:24 AM  Result Value Ref Range   Sodium 135 135 - 145 mmol/L   Potassium 4.1 3.5 - 5.1 mmol/L   Chloride 99 98 - 111 mmol/L   CO2 22 22 - 32 mmol/L   Glucose, Bld 84 70 - 99 mg/dL   BUN 58 (H) 6 - 20 mg/dL   Creatinine, Ser 4.30 (H) 0.61 - 1.24 mg/dL   Calcium 11.6 (H) 8.9 - 10.3 mg/dL   Phosphorus 6.3 (H) 2.5 - 4.6 mg/dL   Albumin 3.6 3.5 - 5.0 g/dL   GFR calc non Af Amer 14 (L) >60 mL/min   GFR calc Af Amer 16 (L) >60 mL/min   Anion gap 14 5 - 15    Comment: Performed at [facility], [address], [place], [place] [address]  CBC with Differential/Platelet     Status: Abnormal   Collection Time: 06/17/18  4:24 AM  Result Value Ref Range   WBC 9.6 4.0 - 10.5 K/uL   RBC 2.76 (L) 4.22 - 5.81 MIL/uL   Hemoglobin 9.4 (L) 13.0 - 17.0 g/dL   HCT 27.7 (L) 39.0 - 52.0 %   MCV 100.4 (H) 80.0 - 100.0 fL   MCH 34.1 (H) 26.0 - 34.0 pg   MCHC 33.9 30.0 - 36.0 g/dL   RDW 18.9 (H) 11.5 - 15.5 %   Platelets 155 150 - 400 K/uL   nRBC 2.2 (H) 0.0 - 0.2 %   Neutrophils Relative % 38 %   Neutro Abs 3.7 1.7 - 7.7 K/uL   Lymphocytes Relative 35 %   Lymphs Abs 3.4 0.7 - 4.0 K/uL   Monocytes Relative 22 %   Monocytes Absolute 2.1 (H) 0.1 - 1.0 K/uL   Eosinophils Relative 1 %   Eosinophils Absolute 0.1 0.0 - 0.5 K/uL   Basophils Relative 1 %   Basophils Absolute 0.1 0.0 - 0.1 K/uL   Immature Granulocytes 3 %   Abs Immature Granulocytes 0.26 (H) 0.00 - 0.07 K/uL   Reactive, Benign Lymphocytes PRESENT     Comment: Performed at [facility], [address], [place], [place] [address]  Comprehensive metabolic panel     Status: Abnormal   Collection Time: 06/17/18  4:24 AM  Result Value Ref Range   Sodium 134 (L) 135 - 145 mmol/L   Potassium 4.1 3.5 - 5.1 mmol/L   Chloride 97 (L) 98 - 111 mmol/L   CO2 22 22 - 32 mmol/L   Glucose, Bld 85 70 - 99 mg/dL   BUN 58 (H) 6 - 20 mg/dL   Creatinine, Ser 4.33 (H) 0.61 - 1.24 mg/dL   Calcium 11.6 (H) 8.9 - 10.3 mg/dL   Total Protein 6.6 6.5 - 8.1 g/dL   Albumin 3.7 3.5 - 5.0 g/dL   AST 20 15 - 41 U/L   ALT 18 0 - 44 U/L   Alkaline Phosphatase 71 38 - 126 U/L   Total Bilirubin 0.5 0.3 - 1.2 mg/dL   GFR calc non Af Amer 14 (L) >60 mL/min   GFR calc Af Amer 16 (L) >60 mL/min   Anion gap 15 5 - 15    Comment: Performed at [facility], [address] [place], [place] [address]  Immunofixation electrophoresis     Status: Abnormal   Collection Time: 06/17/18  4:24 AM  Result Value Ref Range   Total Protein ELP 6.2 6.0 - 8.5 g/dL   IgG (Immunoglobin G), Serum 421 (L) 700 - 1,600 mg/dL    Comment: (NOTE)    **Effective June 28, 2018,  Immunoglobulin G, Qn,**      Serum reference interval will be changing to:             Age                Male          Male         0  - 10 days        41 - 2831     517 - 1231        11d -  6 months      175 -  639     184 -  697          7 - 11 months      261 -  616     073 -  787          1 -  3 years       428 - 7106     269 - 4854          4 -  6 years       538 - 6270     350 - 0938          7 -  9 years       80 - 1829     937 - 1350         10 - 11 years       601 - 1696     789 - 3810         17 - 13 years       55 - 5102     585 - 2778         24 - 15 years       630 - 2353     614 - 4315         40 - 19 years       671 - 0867     619 - 1475             >  19 years       603 - 1613     586 - 1602    IgA 14 (L) 90 - 386 mg/dL    Comment: Result confirmed on concentration.   IgM (Immunoglobulin M), Srm <5 (L) 20 - 172 mg/dL    Comment: (NOTE) Result confirmed on concentration. Performed At: [facility] [place], [place] [id number] Rush Farmer MD QK:[id number]    Immunofixation Result, Serum Comment     Comment: (NOTE) Immunofixation reveals the presence of monoclonal free Kappa light chain. Suggest serum FLC quantitation and/or urine Immunofixation.   Renal function panel     Status: Abnormal   Collection Time: 06/18/18  6:23 AM  Result Value Ref Range   Sodium 134 (L) 135 - 145 mmol/L   Potassium 4.0 3.5 - 5.1 mmol/L   Chloride 97 (L) 98 - 111 mmol/L   CO2 22 22 - 32 mmol/L   Glucose, Bld 132 (H) 70 - 99 mg/dL   BUN 68 (H) 6 - 20 mg/dL   Creatinine, Ser 4.46 (H) 0.61 - 1.24 mg/dL   Calcium 10.7 (H) 8.9 - 10.3 mg/dL   Phosphorus 6.4 (H) 2.5 - 4.6 mg/dL   Albumin 3.7 3.5 - 5.0 g/dL   GFR calc non Af Amer 13 (L) >60 mL/min   GFR calc Af Amer 16 (L) >60 mL/min   Anion gap 15 5 - 15    Comment: Performed at [facility], [address], [place], [place] [address]   No results found.   MEDICATIONS: I have reviewed the patient's  current medications.     Assessment/Plan:  1.  Kappa light chain myeloma: - Received cycle 1 day 1 of cyclophosphamide, bortezomib and dexamethasone on 06/17/2018. -He did not experience any side effects from it. - Bone marrow biopsy shows 87% plasma cells.  FISH panel is pending. - Next treatment will be next week.  2.  Hypercalcemia: -Received denosumab on 06/17/2018.  He is also on calcitonin twice daily. -Calcium improved to 10.7. -Creatinine is 4.46.  He is nonoliguric.  3.  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 - Ferritin was 612 and percent saturation was 35 on 06/12/2018.  Folic acid and Y33 were normal. -This will improve with treatment of myeloma and improvement in renal function. -If it does not improve, will consider parenteral iron/ESA.  All questions were answered. The patient knows to call the clinic with any problems, questions or concerns. We can certainly see the patient much sooner if necessary.     Derek Jack

## 2018-06-19 LAB — RENAL FUNCTION PANEL
Albumin: 3.7 g/dL (ref 3.5–5.0)
Anion gap: 14 (ref 5–15)
BUN: 76 mg/dL — ABNORMAL HIGH (ref 6–20)
CO2: 21 mmol/L — ABNORMAL LOW (ref 22–32)
Calcium: 9.8 mg/dL (ref 8.9–10.3)
Chloride: 99 mmol/L (ref 98–111)
Creatinine, Ser: 4.68 mg/dL — ABNORMAL HIGH (ref 0.61–1.24)
GFR calc Af Amer: 15 mL/min — ABNORMAL LOW (ref >60)
GFR calc non Af Amer: 13 mL/min — ABNORMAL LOW (ref >60)
Glucose, Bld: 86 mg/dL (ref 70–99)
Phosphorus: 4.9 mg/dL — ABNORMAL HIGH (ref 2.5–4.6)
Potassium: 3.8 mmol/L (ref 3.5–5.1)
Sodium: 134 mmol/L — ABNORMAL LOW (ref 135–145)

## 2018-06-19 NOTE — Progress Notes (Signed)
Patient ID: [name], male   DOB: Jan 17, 1961, 58 y.o.   MRN: [id number] [facility] Progress Note   Assessment/ Plan:   1. Acute kidney Injury: Most likely myeloma associated renal injury versus acute injury from hypercalcemia.  He is nonoliguric - renal function appears slightly worse overnight with ongoing intravenous fluids- furosemide stopped yesterday.    I will continue intravenous fluids for forceful diuresis.  No indications for renal replacement therapy at this time but do not like the trend.  Continue to avoid nonsteroidal anti-inflammatory drugs and iodinated intravenous contrast. 2.  Hypercalcemia: Calcium level improving status post calcitonin and prolia.  Discontinue furosemide, continue maintenance intravenous fluids. 3.  Kappa light chain multiple myeloma: Status post cyclophosphamide, bortezomib and dexamethasone with next treatment due next week.  Bone marrow biopsy result/myeloma FISH panel pending to help guide additional management. 4.  Hyponatremia: Mild and secondary to acute kidney injury, monitor with fluids/renal recovery. Is stable  5.  Hyperphosphatemia: Secondary to acute kidney injury, no clinical benefit with management using phosphorus binder at this level of hyperphosphatemia.  Continue to monitor status post denosumab. better 6.  Anemia: Secondary to multiple myeloma, with low iron saturation and elevated ferritin noted.  ESA/iron therapy per hematology.  Renal will check on labs tomorrow but not see pt.  We will re visit him on Monday- continue with IVF   Subjective:   Several nonclinical complaints.  Continues to have pain in back overnight.     Objective:   BP 131/69 (BP Location: Right Arm)   Pulse 94   Temp 98.3 F (36.8 C) (Oral)   Resp 18   Ht 6' (1.829 m)   Wt 77.1 kg   SpO2 98%   BMI 23.06 kg/m   Intake/Output Summary (Last 24 hours) at 06/19/2018 1418 Last data filed at 06/19/2018 1300 Gross per 24 hour  Intake 720 ml   Output 1200 ml  Net -480 ml   Weight change:   Physical Exam: Gen: Comfortably resting in bed CVS: Pulse regular tachycardia, S1 and S2 normal, no murmur/rub Resp: Clear to auscultation bilaterally, no rales or rhonchi Abd: Soft, flat, nontender Ext: Without lower extremity edema  Imaging: No results found.  Labs: BMET Recent Labs  Lab 06/13/18 [phone] 06/14/18 0548 06/15/18 0534 06/16/18 0503 06/17/18 0424 06/18/18 0623 06/19/18 0606  NA 134* 135 134* 134* 135  134* 134* 134*  K 4.5 4.9 4.6 4.3 4.1  4.1 4.0 3.8  CL 105 108 101 99 99  97* 97* 99  CO2 16* 16* 18* 21* 22  22 22  21*  GLUCOSE 83 82 80 98 84  85 132* 86  BUN 85* 71* 63* 57* 58*  58* 68* 76*  CREATININE 5.48* 5.14* 4.92* 4.50* 4.30*  4.33* 4.46* 4.68*  CALCIUM 12.3* 12.2* 12.9* 12.0* 11.6*  11.6* 10.7* 9.8  PHOS  --   --  6.0* 6.2* 6.3* 6.4* 4.9*   CBC Recent Labs  Lab 06/14/18 0548 06/15/18 0534 06/16/18 0503 06/17/18 0424  WBC 10.7* 12.0* 11.0* 9.6  NEUTROABS 4.2 3.9 4.3 3.7  HGB 7.0* 9.8* 9.4* 9.4*  HCT 22.5* 29.7* 28.7* 27.7*  MCV 111.4* 100.3* 101.1* 100.4*  PLT 165 150 158 155   Medications:    . feeding supplement (ENSURE ENLIVE)  237 mL Oral BID BM  . folic acid  1 mg Oral Daily  . lidocaine  1 patch Transdermal Q24H  . polyethylene glycol  17 g Oral Once  . senna-docusate  2  tablet Oral BID  . sodium bicarbonate  650 mg Oral BID   Louis Meckel  06/19/2018, 2:18 PM

## 2018-06-19 NOTE — Progress Notes (Signed)
Patient Demographics:    [name], is a 58 y.o. male, DOB - October 13, 1960, SJG:[id number]  Admit date - 06/12/2018   Admitting Physician Phillips Grout, MD  Outpatient Primary MD for the patient is Patient, No Pcp Per  LOS - 7   Chief Complaint  Patient presents with  . Abdominal Pain        Subjective:    [name] today has no fevers, no emesis,  No chest pain, oxycodone apparently more effective for his pain control,  Assessment  & Plan :    Principal Problem:   AKI (acute kidney injury) ([place]) Active Problems:   Tobacco abuse   Substance abuse ([place])   Personality disorder ([place])   Panlobular emphysema ([place])   Chronic left-sided low back pain with left-sided sciatica   Left lumbar radiculopathy   PNA (pneumonia)   Hypercalcemia   Macrocytic anemia   Lumbar epidural mass ([facility])   Kappa light chain myeloma ([facility])   Brief History: 58 year old male with a history of COPD, testicular cancer, substance abuse, spinal stenosis and lumbar radiculopathy status post fusion 07/24/2015([place])presenting with abdominal pain radiating to the bilateral flanks as well as nausea without any emesis or diarrhea. He denies any fevers, chills, chest pain, shortness of breath, coughing. Unfortunately, patient is a difficult historian and tends to be tangential. Nevertheless, he has also been complaining of worsening lower back pain and leg weakness. He has not fallen or had any syncope. He has been taking over-the-counter NSAIDs as well as tramadol. Patient had MRI of the lumbar spine on 05/06/2018 which showed abnormal right prevertebral and epidural soft tissue at L2-3 concerning for tumor infection. There is also right L3 nerve impingement. There is moderate L4-5 canal stenosis with moderate to severe L2-3 narrowing. The patient subsequently had L2 nerve block performed on 05/27/2018. Upon  presentation, the patient was noted to have hypercalcemia with a corrected calcium of 13.1. The patient was started on IV fluidswith some improvement of his serum calcium.Subsequently, the patient was started on calcitonin. Renal and heme/onc were consulted to assist with management.  Assessment/Plan: AKI -Etiology includes with myeloma Vs NSAIDs Vs hypercalcemia--or combination of all 3 -Renal ultrasound--neg hydronephrosis -SPEP--positive M-spike in gamma region -Serum Immunofixation--pending -serum kappa/lambda light chains--pending -urine immunofixation = kappa Bence Jones protein -baseline creatinine ~1.0 -urine protein/creatinine ratio--5.29 -appreciate nephrology input--nephrologist okay to continue IV fluids, Stopped IV Lasix on 06/18/18 Creatinine is 4.68  Kappa light chain multiple myeloma: Status post cyclophosphamide, bortezomib and dexamethasone with next treatment due next week.  Bone marrow biopsy result/myeloma FISH panel pending to help guide additional management.  Hypercalcemia -Intact PTH--9 -likely due to underlying malignancy--data pointing to myeloma -Continue IV fluids as serum calcium is improving  status post calcitonin and ongoing denosumab-- Lasix discontinued by his nephrologist as of 06/18/2018 Calcium is down to 9.8 -SPEP--positive M-spike in gamma region -Immunofixation--pending -serum kappa/lambda light chains--pending -urine immunofixation = kappa Bence-Jones protein -heme/onc consult-->Katrag[place]da spoke with IR after I spoke with Dr. Vernard Gambles (IR)-->only wants BM biopsy, cancelled L2-3 biopsy -completed 4 doses calcitonin B and E  Lumbar radiculopathy -05/06/2018 MRI L-spine--diffusely abnormal bone marrow signal concerning for myeloproliferative disorder versus infiltrative metastatic disease. Abnormal prevertebraltissue as discussed above at L2-3 -3/16&3/17--discussed with neurosurgery, Dr. Melynda Keller nonoperative  managementfor  radiculopathy -06/15/18--discussed with IR (Dr. Gaye Pollack for needle bx of L2-3 soft tissue-->placed order in Epic -Dr. Delton Coombes spoke with IR after I spoke with Dr. Vernard Gambles (IR)-->only wants BM biopsy, cancelled L2-3 biopsy -3/16 MR T-spine--no acute findings 3/16 MR L-spine--motion degraded with persistent paracentral soft tissue -06/15/18--ambulated pt--pt able to ambulate with minimal to no assist Pain control improving on Percocets,  Bilateral pleural effusions -Echocardiogram--probably normal EF, poor quality -Urine protein creatinine ratio--5.29 -Stable on room air  Proteinuria -pt has nephrotic range proteinuria -has contributed to pleural effusions -urine protein/creatinine ratio 5.29 -nephrology following--appreciated  COPD -Stable on room air -Patient states that he quit smoking 1 month PTA  Pulmonary opacity -CT abdomen and pelvis showed consolidation in the posterior left lower lobe -Patient has no fever or shortness of breath -Viral respiratory panel shows rhinovirus -procalcitonin--7.71 -CT chest--no consolidation, moderate bilateral pleural effusion with atelectasis. Multiple tiny bilateral nodules. -d/c ceftriaxone and azithromycin-->remains afebrile and hemodynamically stable  Abdominal Pain -improving after BM -3/14 CT abd--mid sigmoid wall thickening withoutsurrounding inflammation--?muscular hypertrophy  Macrocytic anemia--likely due to multiple myeloma-- ?? If needs ESA/iron will defer to hematology and nephrology -F64--332 -folic RJJO--8.4 -iron saturation 35%, ferritin 62 -transfused2 units 06/14/18 -started folate supplementation    Disposition Plan:Not stable for d/c Family Communication:daughter updated at bedside   Consultants:Renal/heme/onc and IR  Code Status: FULL   DVT Prophylaxis: [place] Heparin    Procedures: As Listed in Progress Note Above  Antibiotics: Ceftriaxone 3/14>>>3/16 azithrommycin  3/14>>>3/16  Disposition/Need for in-Hospital Stay- patient unable to be discharged at this time due to need for IVF given hypercalcemia, acute kidney injury in the setting of multiple myeloma pending results  Code Status : Full   Family Communication:   Daughter at bedside  DVT Prophylaxis  :   SCDs  Lab Results  Component Value Date   PLT 155 06/17/2018    Inpatient Medications  Scheduled Meds: . feeding supplement (ENSURE ENLIVE)  237 mL Oral BID BM  . folic acid  1 mg Oral Daily  . lidocaine  1 patch Transdermal Q24H  . polyethylene glycol  17 g Oral Once  . senna-docusate  2 tablet Oral BID  . sodium bicarbonate  650 mg Oral BID   Continuous Infusions: . sodium chloride 125 mL/hr at 06/19/18 1412   PRN Meds:.oxyCODONE-acetaminophen    Anti-infectives (From admission, onward)   Start     Dose/Rate Route Frequency Ordered Stop   06/12/18 2100  azithromycin (ZITHROMAX) 500 mg in sodium chloride 0.9 % 250 mL IVPB  Status:  Discontinued     500 mg 250 mL/hr over 60 Minutes Intravenous Every 24 hours 06/12/18 2011 06/15/18 0753   06/12/18 2100  cefTRIAXone (ROCEPHIN) 1 g in sodium chloride 0.9 % 100 mL IVPB  Status:  Discontinued     1 g 200 mL/hr over 30 Minutes Intravenous Every 24 hours 06/12/18 2054 06/12/18 2056   06/12/18 2100  azithromycin (ZITHROMAX) 500 mg in sodium chloride 0.9 % 250 mL IVPB  Status:  Discontinued     500 mg 250 mL/hr over 60 Minutes Intravenous Every 24 hours 06/12/18 2054 06/12/18 2056   06/12/18 2030  cefTRIAXone (ROCEPHIN) 1 g in sodium chloride 0.9 % 100 mL IVPB  Status:  Discontinued     1 g 200 mL/hr over 30 Minutes Intravenous Every 24 hours 06/12/18 2011 06/15/18 0753        Objective:   Vitals:   06/16/18 2051 06/17/18 2051 06/18/18 2040 06/18/18 2148  BP:    131/69  Pulse:    94  Resp:    18  Temp:    98.3 F (36.8 C)  TempSrc:    Oral  SpO2: 96% 96% 96% 98%  Weight:      Height:        Wt Readings from Last 3  Encounters:  06/12/18 77.1 kg  05/29/18 79.4 kg  04/15/18 93 kg     Intake/Output Summary (Last 24 hours) at 06/19/2018 1832 Last data filed at 06/19/2018 1300 Gross per 24 hour  Intake 720 ml  Output 800 ml  Net -80 ml     Physical Exam Patient is examined daily including today on 06/19/18 , exams remain the same as of yesterday except that has changed   Gen:- Awake Alert,  In no apparent distress  HEENT:- [place].AT, No sclera icterus Neck-Supple Neck,No JVD,.  Lungs-  CTAB , fair symmetrical air movement CV- S1, S2 normal, regular  Abd-  +ve B.Sounds, Abd Soft, No tenderness,    Extremity/Skin:- No  edema, pedal pulses present  Psych-affect is appropriate, oriented x3 Neuro-no new focal deficits, no tremors   Data Review:   Micro Results Recent Results (from the past 240 hour(s))  Culture, blood (routine x 2) Call MD if unable to obtain prior to antibiotics being given     Status: None   Collection Time: 06/12/18 10:41 PM  Result Value Ref Range Status   Specimen Description BLOOD RIGHT HAND  Final   Special Requests   Final    BOTTLES DRAWN AEROBIC AND ANAEROBIC Blood Culture adequate volume   Culture   Final    NO GROWTH 5 DAYS Performed at [facility], [address], [place], [place] [address]    Report Status 06/17/2018 FINAL  Final  Respiratory Panel by PCR     Status: Abnormal   Collection Time: 06/12/18 10:44 PM  Result Value Ref Range Status   Adenovirus NOT DETECTED NOT DETECTED Final   Coronavirus 229E NOT DETECTED NOT DETECTED Final    Comment: (NOTE) The Coronavirus on the Respiratory Panel, DOES NOT test for the novel  Coronavirus (2019 nCoV)    Coronavirus HKU1 NOT DETECTED NOT DETECTED Final   Coronavirus NL63 NOT DETECTED NOT DETECTED Final   Coronavirus OC43 NOT DETECTED NOT DETECTED Final   Metapneumovirus NOT DETECTED NOT DETECTED Final   Rhinovirus / Enterovirus DETECTED (A) NOT DETECTED Final   Influenza A NOT DETECTED NOT DETECTED Final    Influenza B NOT DETECTED NOT DETECTED Final   Parainfluenza Virus 1 NOT DETECTED NOT DETECTED Final   Parainfluenza Virus 2 NOT DETECTED NOT DETECTED Final   Parainfluenza Virus 3 NOT DETECTED NOT DETECTED Final   Parainfluenza Virus 4 NOT DETECTED NOT DETECTED Final   Respiratory Syncytial Virus NOT DETECTED NOT DETECTED Final   Bordetella pertussis NOT DETECTED NOT DETECTED Final   Chlamydophila pneumoniae NOT DETECTED NOT DETECTED Final   Mycoplasma pneumoniae NOT DETECTED NOT DETECTED Final    Comment: Performed at [facility], [place] [address] [place], [place] [address]  Culture, blood (routine x 2) Call MD if unable to obtain prior to antibiotics being given     Status: None   Collection Time: 06/12/18 10:58 PM  Result Value Ref Range Status   Specimen Description BLOOD LEFT ARM  Final   Special Requests   Final    BOTTLES DRAWN AEROBIC AND ANAEROBIC Blood Culture adequate volume   Culture   Final  NO GROWTH 5 DAYS Performed at [facility], [address], [place], [place] [address]    Report Status 06/17/2018 FINAL  Final  MRSA PCR Screening     Status: None   Collection Time: 06/13/18 12:20 AM  Result Value Ref Range Status   MRSA by PCR NEGATIVE NEGATIVE Final    Comment:        The GeneXpert MRSA Assay (FDA approved for NASAL specimens only), is one component of a comprehensive MRSA colonization surveillance program. It is not intended to diagnose MRSA infection nor to guide or monitor treatment for MRSA infections. Performed at [facility], [address], [place],  [address]     Radiology Reports Ct Abdomen Pelvis Wo Contrast  Result Date: 06/12/2018 CLINICAL DATA:  Abdominal pain and fever EXAM: CT ABDOMEN AND PELVIS WITHOUT CONTRAST TECHNIQUE: Multidetector CT imaging of the abdomen and pelvis was performed following the standard protocol without IV contrast. Oral contrast was administered. COMPARISON:  Aug 12, 2012 FINDINGS: Lower chest: There  are free-flowing pleural effusions bilaterally with bibasilar atelectasis. There is questionable mild consolidation in the posterior left lung base as well. Hepatobiliary: No focal liver lesions are appreciable on this noncontrast enhanced study. The gallbladder wall is not appreciably thickened. There is no biliary duct dilatation. Pancreas: No pancreatic mass or inflammatory focus. Spleen: No splenic lesions are evident. Adrenals/Urinary Tract: Adrenals bilaterally appear unremarkable. Kidneys bilaterally show no evident mass or hydronephrosis on either side. There is no appreciable renal or ureteral calculus on either side. Urinary bladder is midline with wall thickness within normal limits. Stomach/Bowel: There are sigmoid diverticula without overt diverticulitis. There is wall thickening in portions of the mid sigmoid colon without surrounding inflammation. Suspect muscular hypertrophy in this area from chronic diverticulosis. No bowel wall thickening is noted elsewhere. No bowel obstruction appreciable. No free air or portal venous air. Vascular/Lymphatic: There is aortic and bilateral iliac artery atherosclerosis. No aneurysm evident. There is no appreciable adenopathy in the abdomen or pelvis. Reproductive: Prostate and seminal vesicles appear normal in size and contour. There is no appreciable pelvic mass. Other: Appendix appears normal. No abscess or ascites is evident in the abdomen or pelvis. Musculoskeletal: There is extensive postoperative change from L1-L4. There is arthropathy throughout the lumbar region. There are no blastic or lytic bone lesions. There is no intramuscular or abdominal wall lesion evident. IMPRESSION: 1. Moderate free-flowing pleural effusions bilaterally with bibasilar atelectasis. Question a degree of superimposed pneumonia in the posterior left base. 2. Wall thickening in the mid sigmoid colon, likely due to muscular hypertrophy from chronic diverticulosis. Earliest changes of  diverticulitis in this area are difficult to entirely exclude. No overt diverticulitis is evident. 3. No bowel obstruction. Appendix appears normal. No evident abscess in the abdomen or pelvis. 4. No evident renal or ureteral calculus. No evident hydronephrosis on either side. 5.  Aortoiliac atherosclerosis. 6.  Extensive postoperative change throughout the lumbar spine. Electronically Signed   By: Lowella Grip III M.D.   On: 06/12/2018 17:14   Dg Chest 2 View  Result Date: 06/12/2018 CLINICAL DATA:  Stomach pain for a week. No vomiting or diarrhea. Fever. EXAM: CHEST - 2 VIEW COMPARISON:  CT chest 05/28/2016. Chest 11/04/2010 FINDINGS: Normal heart size and pulmonary vascularity. Small left pleural effusion with infiltration in the left lung base. This suggests pneumonia. Right lung is clear. No pneumothorax. Mediastinal contours appear intact. IMPRESSION: Small left pleural effusion with infiltration in the left lung base suggesting pneumonia. Electronically Signed  By: Lucienne Capers M.D.   On: 06/12/2018 21:18   Ct Chest Wo Contrast  Result Date: 06/14/2018 CLINICAL DATA:  58 year old male with history of chest pain and pleural effusion. EXAM: CT CHEST WITHOUT CONTRAST TECHNIQUE: Multidetector CT imaging of the chest was performed following the standard protocol without IV contrast. COMPARISON:  Chest CT 05/20/2016.  Chest x-ray 06/12/2018. FINDINGS: Cardiovascular: Heart size is normal. There is no significant pericardial fluid, thickening or pericardial calcification. Aortic atherosclerosis. No coronary artery calcifications. Mediastinum/Nodes: No pathologically enlarged mediastinal or hilar lymph nodes. Esophagus is unremarkable in appearance. No axillary lymphadenopathy. Lungs/Pleura: Multiple tiny 2-3 mm pulmonary nodules scattered throughout both lungs, several of which are calcified, compatible with benign granulomas. No larger more suspicious appearing pulmonary nodules or masses are  noted. Moderate-sized bilateral pleural effusions lying dependently. This is associated with some mild passive atelectasis in the lower lobes of the lungs bilaterally. No acute consolidative airspace disease. Diffuse bronchial wall thickening with mild centrilobular and paraseptal emphysema. Upper Abdomen: Aortic atherosclerosis. Musculoskeletal: There are no aggressive appearing lytic or blastic lesions noted in the visualized portions of the skeleton. IMPRESSION: 1. Moderate bilateral pleural effusions lying dependently with areas of passive subsegmental atelectasis in the lower lobes of the lungs bilaterally. 2. Multiple tiny 2-3 mm pulmonary nodules scattered throughout the lungs bilaterally, nonspecific, but similar to prior study from 05/28/2016, considered definitively benign. 3. Aortic atherosclerosis. 4. Diffuse bronchial wall thickening with mild centrilobular and paraseptal emphysema; imaging findings suggestive of underlying COPD. Aortic Atherosclerosis (ICD10-I70.0) and Emphysema (ICD10-J43.9). Electronically Signed   By: Vinnie Langton M.D.   On: 06/14/2018 10:53   Mr Thoracic Spine Wo Contrast  Result Date: 06/14/2018 CLINICAL DATA:  Mid low back pain for 2 months EXAM: MRI THORACIC AND LUMBAR SPINE WITHOUT CONTRAST TECHNIQUE: Multiplanar and multiecho pulse sequences of the thoracic and lumbar spine were obtained without intravenous contrast. COMPARISON:  Lumbar spine MRI 05/06/2018 FINDINGS: Severe patient motion degrading image quality limiting evaluation. Portion of the examination are nondiagnostic. MRI THORACIC SPINE FINDINGS Alignment:  Physiologic. Vertebrae: No fracture, evidence of discitis, or bone lesion. Cord:  Normal signal and morphology. Paraspinal and other soft tissues: No acute paraspinal abnormality. Disc levels: Disc spaces:  Disc spaces are relatively preserved. C6-7: Mild broad-based disc bulge. No foraminal or central canal stenosis. T1-T2: No disc protrusion, foraminal  stenosis or central canal stenosis. T2-T3: No disc protrusion, foraminal stenosis or central canal stenosis. T3-T4: Broad-based disc bulge. Moderate bilateral facet arthropathy. Mild spinal stenosis. T4-T5: No disc protrusion, foraminal stenosis or central canal stenosis. Bilateral mild facet arthropathy. T5-T6: No disc protrusion, foraminal stenosis or central canal stenosis. T6-T7: No disc protrusion, foraminal stenosis or central canal stenosis. T7-T8: No disc protrusion, foraminal stenosis or central canal stenosis. T8-T9: No disc protrusion, foraminal stenosis or central canal stenosis. T9-T10: No disc protrusion, foraminal stenosis or central canal stenosis. T10-T11: No disc protrusion, foraminal stenosis or central canal stenosis. T11-T12: No disc protrusion, foraminal stenosis or central canal stenosis. MRI LUMBAR SPINE FINDINGS Segmentation:  Standard. Alignment:  Physiologic. Vertebrae: No fracture, evidence of discitis, or aggressive bone lesion. Low marrow signal throughout the lumbar spine which is limited in evaluation secondary to patient motion. Conus medullaris and cauda equina: Conus extends to the T12 level. Conus and cauda equina appear normal. Paraspinal and other soft tissues: No acute paraspinal abnormality. Disc levels: Disc spaces: Posterior lumbar interbody fusion from L1 through L4. T12-L1: Minimal broad-based disc bulge. Mild bilateral facet arthropathy. No evidence of neural  foraminal stenosis. No central canal stenosis. L1-L2: Interbody fusion. No evidence of neural foraminal stenosis. No central canal stenosis. L2-L3: Interbody fusion. Evaluation is extremely limited secondary to patient motion degrading image quality. There is suggestion of persistent right central/right paracentral epidural soft tissue, but characterization is extremely difficult for given the degree of patient motion. Right foraminal stenosis. No central canal stenosis. L3-L4: Interbody fusion. No evidence of neural  foraminal stenosis. No central canal stenosis. L4-L5: Minimal broad-based disc bulge. Mild bilateral facet scratch them moderate bilateral facet arthropathy. Mild left foraminal stenosis. No right foraminal stenosis. No central canal stenosis. L5-S1: Broad-based disc bulge. Moderate bilateral facet arthropathy. Mild left foraminal stenosis. No significant right foraminal stenosis. No central canal stenosis. IMPRESSION: Severe patient motion degrading image quality limiting evaluation. Portion of the examination are nondiagnostic. MR THORACIC SPINE IMPRESSION 1. No aggressive osseous lesion to suggest malignancy. 2.  No acute osseous injury of the thoracic spine. MR LUMBAR SPINE IMPRESSION 1. L2-3 is extremely limited in evaluation secondary to patient motion degrading image quality. There is suggestion of persistent right central/right paracentral epidural soft tissue, but characterization is extremely difficult for given the degree of patient motion. Right foraminal stenosis. Recommend repeat MRI of the lumbar spine when the patient is able to better tolerate the examination with less patient motion. 2.  No acute osseous injury of the lumbar spine. Electronically Signed   By: Kathreen Devoid   On: 06/14/2018 19:51   Mr Lumbar Spine Wo Contrast  Result Date: 06/14/2018 CLINICAL DATA:  Mid low back pain for 2 months EXAM: MRI THORACIC AND LUMBAR SPINE WITHOUT CONTRAST TECHNIQUE: Multiplanar and multiecho pulse sequences of the thoracic and lumbar spine were obtained without intravenous contrast. COMPARISON:  Lumbar spine MRI 05/06/2018 FINDINGS: Severe patient motion degrading image quality limiting evaluation. Portion of the examination are nondiagnostic. MRI THORACIC SPINE FINDINGS Alignment:  Physiologic. Vertebrae: No fracture, evidence of discitis, or bone lesion. Cord:  Normal signal and morphology. Paraspinal and other soft tissues: No acute paraspinal abnormality. Disc levels: Disc spaces:  Disc spaces are  relatively preserved. C6-7: Mild broad-based disc bulge. No foraminal or central canal stenosis. T1-T2: No disc protrusion, foraminal stenosis or central canal stenosis. T2-T3: No disc protrusion, foraminal stenosis or central canal stenosis. T3-T4: Broad-based disc bulge. Moderate bilateral facet arthropathy. Mild spinal stenosis. T4-T5: No disc protrusion, foraminal stenosis or central canal stenosis. Bilateral mild facet arthropathy. T5-T6: No disc protrusion, foraminal stenosis or central canal stenosis. T6-T7: No disc protrusion, foraminal stenosis or central canal stenosis. T7-T8: No disc protrusion, foraminal stenosis or central canal stenosis. T8-T9: No disc protrusion, foraminal stenosis or central canal stenosis. T9-T10: No disc protrusion, foraminal stenosis or central canal stenosis. T10-T11: No disc protrusion, foraminal stenosis or central canal stenosis. T11-T12: No disc protrusion, foraminal stenosis or central canal stenosis. MRI LUMBAR SPINE FINDINGS Segmentation:  Standard. Alignment:  Physiologic. Vertebrae: No fracture, evidence of discitis, or aggressive bone lesion. Low marrow signal throughout the lumbar spine which is limited in evaluation secondary to patient motion. Conus medullaris and cauda equina: Conus extends to the T12 level. Conus and cauda equina appear normal. Paraspinal and other soft tissues: No acute paraspinal abnormality. Disc levels: Disc spaces: Posterior lumbar interbody fusion from L1 through L4. T12-L1: Minimal broad-based disc bulge. Mild bilateral facet arthropathy. No evidence of neural foraminal stenosis. No central canal stenosis. L1-L2: Interbody fusion. No evidence of neural foraminal stenosis. No central canal stenosis. L2-L3: Interbody fusion. Evaluation is extremely limited secondary to patient  motion degrading image quality. There is suggestion of persistent right central/right paracentral epidural soft tissue, but characterization is extremely difficult for  given the degree of patient motion. Right foraminal stenosis. No central canal stenosis. L3-L4: Interbody fusion. No evidence of neural foraminal stenosis. No central canal stenosis. L4-L5: Minimal broad-based disc bulge. Mild bilateral facet scratch them moderate bilateral facet arthropathy. Mild left foraminal stenosis. No right foraminal stenosis. No central canal stenosis. L5-S1: Broad-based disc bulge. Moderate bilateral facet arthropathy. Mild left foraminal stenosis. No significant right foraminal stenosis. No central canal stenosis. IMPRESSION: Severe patient motion degrading image quality limiting evaluation. Portion of the examination are nondiagnostic. MR THORACIC SPINE IMPRESSION 1. No aggressive osseous lesion to suggest malignancy. 2.  No acute osseous injury of the thoracic spine. MR LUMBAR SPINE IMPRESSION 1. L2-3 is extremely limited in evaluation secondary to patient motion degrading image quality. There is suggestion of persistent right central/right paracentral epidural soft tissue, but characterization is extremely difficult for given the degree of patient motion. Right foraminal stenosis. Recommend repeat MRI of the lumbar spine when the patient is able to better tolerate the examination with less patient motion. 2.  No acute osseous injury of the lumbar spine. Electronically Signed   By: Kathreen Devoid   On: 06/14/2018 19:51   US Renal  Result Date: 06/14/2018 CLINICAL DATA:  Acute on chronic renal failure EXAM: RENAL / URINARY TRACT ULTRASOUND COMPLETE COMPARISON:  06/12/2018 FINDINGS: Right Kidney: Renal measurements: 11.6 x 4.9 x 7.6 cm = volume: 224 mL. Diffuse increased echogenicity is noted. No mass or hydronephrosis is noted. Left Kidney: Renal measurements: 11.9 x 6.5 x 5.5 cm = volume: 222 mL. Increased echogenicity is noted. No mass lesion or hydronephrosis is seen. Bladder: Appears normal for degree of bladder distention. Bilateral pleural effusions are noted similar to that seen  on prior CT. IMPRESSION: Diffuse increased echogenicity consistent with the given clinical history. Bilateral small effusions. Electronically Signed   By: Inez Catalina M.D.   On: 06/14/2018 10:30   Ct Biopsy  Result Date: 06/16/2018 INDICATION: 58 year old male referred for bone marrow biopsy with history of possible multiple myeloma EXAM: CT BONE MARROW BIOPSY AND ASPIRATION; CT BIOPSY MEDICATIONS: None. ANESTHESIA/SEDATION: Moderate (conscious) sedation was employed during this procedure. A total of Versed 3.0 mg and Fentanyl 100 mcg was administered intravenously. Moderate Sedation Time: 10 minutes. The patient's level of consciousness and vital signs were monitored continuously by radiology nursing throughout the procedure under my direct supervision. FLUOROSCOPY TIME:  CT COMPLICATIONS: None PROCEDURE: The procedure risks, benefits, and alternatives were explained to the patient. Questions regarding the procedure were encouraged and answered. The patient understands and consents to the procedure. Scout CT of the pelvis was performed for surgical planning purposes. The posterior pelvis was prepped with Chlorhexidine in a sterile fashion, and a sterile drape was applied covering the operative field. A sterile gown and sterile gloves were used for the procedure. Local anesthesia was provided with 1% Lidocaine. Posterior left iliac bone was targeted for biopsy. The skin and subcutaneous tissues were infiltrated with 1% lidocaine without epinephrine. A small stab incision was made with an 11 blade scalpel, and an 11 gauge Murphy needle was advanced with CT guidance to the posterior cortex. Manual forced was used to advance the needle through the posterior cortex and the stylet was removed. A bone marrow aspirate was retrieved and passed to a cytotechnologist in the room. The initial aspirate was negative for visualized spicules/bone particles. Secondary aspirate was performed  with heparin. The Murphy needle was  then advanced without the stylet for a core biopsy. The core biopsy was retrieved and also passed to a cytotechnologist. A second core biopsy was achieved given the absence of bone particles. Manual pressure was used for hemostasis and a sterile dressing was placed. No complications were encountered no significant blood loss was encountered. Patient tolerated the procedure well and remained hemodynamically stable throughout. IMPRESSION: Status post CT-guided bone marrow biopsy, with tissue specimen sent to pathology for complete histopathologic analysis Signed, Dulcy Fanny. Earleen Newport, DO [facility] Electronically Signed   By: Corrie Mckusick D.O.   On: 06/16/2018 12:03   Dg Bone Survey Met  Result Date: 06/15/2018 CLINICAL DATA:  58 year old male with a history of hypercalcemia EXAM: METASTATIC BONE SURVEY COMPARISON:  Chest CT 06/14/2018, plain film 06/12/2018, lumbar CT 05/06/2018 FINDINGS: Skull: No acute fracture. No lytic or sclerotic lesion identified.  Endodontal disease. Cervical spine: Surgical changes of anterior cervical discectomy infusion with anterior plate screw fixation F7-P1 with no complicating features. Degenerative changes at the levels above and below. No lytic lesions or sclerotic lesions.  Alignment maintained. Thoracic spine: Vertebral body heights maintained with alignment. Mild disc disease of the thoracic spine. No acute fracture. No lytic or sclerotic lesions. Unremarkable appearance of the pedicles. Lumbar spine: Surgical changes of posterior lumbar interbody fusion with bilateral pedicle screw and rod fixation spanning L1-L4. No complicating features. No acute fracture. The lytic changes that were identified on prior CT are not visualized on the current plain film. Moderate disc disease and facet disease of the lumbar spine. No lytic or sclerotic lesions identified. Chest: Cardiomediastinal silhouette within normal limits. Blunting  of the bilateral costophrenic angles with obscuration of the hemidiaphragms bilaterally. No interlobular septal thickening. No rib fracture identified. Pelvis: No acute fracture. Sclerotic changes at the inferior aspect of the left sacroiliac joint on the iliac aspect, similar to prior CT abdomen. No lytic changes. Degenerative changes of the hips. Right upper extremity: No acute fracture.  No lucent lesion or sclerotic focus. Left upper extremity: No acute fracture.  No lucent lesion or sclerotic changes. Right lower extremity: No acute fracture.  No no lucent lesion or sclerotic lesion. Left lower extremity: No acute fracture.  No lucent lesion or sclerotic lesion. IMPRESSION: Abnormal lucent changes identified in the lumbar spine on the prior CT dated 05/06/2018 are not visualized on the current plain film. If concern for malignancy, correlation with either nuclear medicine bone scan or PET-CT may be useful. Sclerotic focus at the inferior aspect of the left sacral iliac joint, present on prior CT pelvis. If concern for malignancy, correlation with either nuclear medicine bone scan or PET-CT may be useful. Bilateral small pleural effusions, present on prior CT. Electronically Signed   By: Corrie Mckusick D.O.   On: 06/15/2018 09:22   Ct Bone Marrow Biopsy & Aspiration  Result Date: 06/16/2018 INDICATION: 58 year old male referred for bone marrow biopsy with history of possible multiple myeloma EXAM: CT BONE MARROW BIOPSY AND ASPIRATION; CT BIOPSY MEDICATIONS: None. ANESTHESIA/SEDATION: Moderate (conscious) sedation was employed during this procedure. A total of Versed 3.0 mg and Fentanyl 100 mcg was administered intravenously. Moderate Sedation Time: 10 minutes. The patient's level of consciousness and vital signs were monitored continuously by radiology nursing throughout the procedure under my direct supervision. FLUOROSCOPY TIME:  CT COMPLICATIONS: None PROCEDURE: The procedure risks, benefits, and  alternatives were explained to the patient. Questions regarding the procedure were encouraged  and answered. The patient understands and consents to the procedure. Scout CT of the pelvis was performed for surgical planning purposes. The posterior pelvis was prepped with Chlorhexidine in a sterile fashion, and a sterile drape was applied covering the operative field. A sterile gown and sterile gloves were used for the procedure. Local anesthesia was provided with 1% Lidocaine. Posterior left iliac bone was targeted for biopsy. The skin and subcutaneous tissues were infiltrated with 1% lidocaine without epinephrine. A small stab incision was made with an 11 blade scalpel, and an 11 gauge Murphy needle was advanced with CT guidance to the posterior cortex. Manual forced was used to advance the needle through the posterior cortex and the stylet was removed. A bone marrow aspirate was retrieved and passed to a cytotechnologist in the room. The initial aspirate was negative for visualized spicules/bone particles. Secondary aspirate was performed with heparin. The Murphy needle was then advanced without the stylet for a core biopsy. The core biopsy was retrieved and also passed to a cytotechnologist. A second core biopsy was achieved given the absence of bone particles. Manual pressure was used for hemostasis and a sterile dressing was placed. No complications were encountered no significant blood loss was encountered. Patient tolerated the procedure well and remained hemodynamically stable throughout. IMPRESSION: Status post CT-guided bone marrow biopsy, with tissue specimen sent to pathology for complete histopathologic analysis Signed, Dulcy Fanny. Earleen Newport, DO [facility] Electronically Signed   By: Corrie Mckusick D.O.   On: 06/16/2018 12:03   Dg Inject Diag/thera/inc Needle/cath/plc Epi/lumb/sac W/img  Result Date: 05/27/2018 CLINICAL DATA:  Previous fusion  surgery. Back and right lower extremity pain. EXAM: SELECTIVE NERVE ROOT BLOCK AND TRANSFORAMINAL EPIDURAL STEROID INJECTION UNDER FLUOROSCOPY FLUOROSCOPY TIME:  43 seconds; 57 uGym2 DAP TECHNIQUE: The procedure, risks (including but not limited to bleeding, infection, organ damage ), benefits, and alternatives were explained to the patient. Questions regarding the procedure were encouraged and answered. The patient understands and consents to the procedure. An appropriate skin entry site was determined under fluoroscopy. Operator donned sterile gloves and mask. Site was marked, prepped with Betadine, draped in usual sterile fashion, infiltrated locally with 1% lidocaine. A 22 gauge spinal needle was advanced to the superior ventral margin of the right L2-3 neural foramen. Diagnostic injection of 2 ml Omnipaque 180 showed partial outlining of the exiting nerve root as well as minimal epidural extension of contrast, with no intravascular or subarachnoid component. 120 mg Depo-Medrol in 3 ml lidocaine 1% was administered. The patient tolerated procedure well, with no immediate complication. IMPRESSION: 1. Technically successful right L2 selective nerve root block and transforaminal epidural steroid injection Electronically Signed   By: Lucrezia Europe M.D.   On: 05/27/2018 15:28     CBC Recent Labs  Lab 06/13/18 0639 06/14/18 0548 06/15/18 0534 06/16/18 0503 06/17/18 0424  WBC 9.6 10.7* 12.0* 11.0* 9.6  HGB 7.3* 7.0* 9.8* 9.4* 9.4*  HCT 22.9* 22.5* 29.7* 28.7* 27.7*  PLT 150 165 150 158 155  MCV 111.2* 111.4* 100.3* 101.1* 100.4*  MCH 35.4* 34.7* 33.1 33.1 34.1*  MCHC 31.9 31.1 33.0 32.8 33.9  RDW 15.6* 15.5 20.1* 19.6* 18.9*  LYMPHSABS 3.8 4.1* 4.9* 4.4* 3.4  MONOABS 1.9* 2.0* 2.7* 1.8* 2.1*  EOSABS 0.1 0.1 0.1 0.1 0.1  BASOSABS 0.0 0.1 0.1 0.1 0.1    Chemistries  Recent Labs  Lab 06/15/18 0534 06/16/18 0503 06/17/18 0424 06/18/18 0623 06/19/18 0606  NA 134* 134* 135  134* 134* 134*  K  4.6 4.3 4.1  4.1 4.0 3.8  CL 101 99 99  97* 97* 99  CO2 18* 21* _0 21*  GLUCOSE 80 98 84  85 132* 86  BUN 63* 57* 58*  58* 68* 76*  CREATININE 4.92* 4.50* 4.30*  4.33* 4.46* 4.68*  CALCIUM 12.9* 12.0* 11.6*  11.6* 10.7* 9.8  AST 22  --  20  --   --   ALT 19  --  18  --   --   ALKPHOS 82  --  71  --   --   BILITOT 0.3  --  0.5  --   --    ------------------------------------------------------------------------------------------------------------------ No results for input(s): CHOL, HDL, LDLCALC, TRIG, CHOLHDL, LDLDIRECT in the last 72 hours.  No results found for: HGBA1C ------------------------------------------------------------------------------------------------------------------ No results for input(s): TSH, T4TOTAL, T3FREE, THYROIDAB in the last 72 hours.  Invalid input(s): FREET3 ------------------------------------------------------------------------------------------------------------------ No results for input(s): VITAMINB12, FOLATE, FERRITIN, TIBC, IRON, RETICCTPCT in the last 72 hours.  Coagulation profile Recent Labs  Lab 06/15/18 1153  INR 1.0    No results for input(s): DDIMER in the last 72 hours.  Cardiac Enzymes No results for input(s): CKMB, TROPONINI, MYOGLOBIN in the last 168 hours.  Invalid input(s): CK ------------------------------------------------------------------------------------------------------------------ No results found for: BNP   Roxan Hockey M.D on 06/19/2018 at 6:32 PM  Go to [email or web address] - for contact info  [facility] - Office  [phone]

## 2018-06-20 LAB — RENAL FUNCTION PANEL
Albumin: 3.6 g/dL (ref 3.5–5.0)
Anion gap: 13 (ref 5–15)
BUN: 76 mg/dL — ABNORMAL HIGH (ref 6–20)
CO2: 22 mmol/L (ref 22–32)
Calcium: 9.6 mg/dL (ref 8.9–10.3)
Chloride: 102 mmol/L (ref 98–111)
Creatinine, Ser: 4.78 mg/dL — ABNORMAL HIGH (ref 0.61–1.24)
GFR calc Af Amer: 14 mL/min — ABNORMAL LOW (ref >60)
GFR calc non Af Amer: 12 mL/min — ABNORMAL LOW (ref >60)
Glucose, Bld: 80 mg/dL (ref 70–99)
Phosphorus: 5.1 mg/dL — ABNORMAL HIGH (ref 2.5–4.6)
Potassium: 4.2 mmol/L (ref 3.5–5.1)
Sodium: 137 mmol/L (ref 135–145)

## 2018-06-20 LAB — CBC
HCT: 27.9 % — ABNORMAL LOW (ref 39.0–52.0)
Hemoglobin: 8.9 g/dL — ABNORMAL LOW (ref 13.0–17.0)
MCH: 33.7 pg (ref 26.0–34.0)
MCHC: 31.9 g/dL (ref 30.0–36.0)
MCV: 105.7 fL — ABNORMAL HIGH (ref 80.0–100.0)
Platelets: 164 10*3/uL (ref 150–400)
RBC: 2.64 MIL/uL — ABNORMAL LOW (ref 4.22–5.81)
RDW: 18.4 % — ABNORMAL HIGH (ref 11.5–15.5)
WBC: 11.7 10*3/uL — ABNORMAL HIGH (ref 4.0–10.5)
nRBC: 1.8 % — ABNORMAL HIGH (ref 0.0–0.2)

## 2018-06-20 MED ORDER — HYDROCORTISONE 1 % EX CREA
1.0000 "application " | TOPICAL_CREAM | Freq: Three times a day (TID) | CUTANEOUS | Status: DC | PRN
  Filled 2018-06-20: qty 28

## 2018-06-20 MED ORDER — ALUM & MAG HYDROXIDE-SIMETH 200-200-20 MG/5ML PO SUSP
30.0000 mL | ORAL | Status: DC | PRN
  Administered 2018-06-20: 30 mL via ORAL
  Filled 2018-06-20: qty 30

## 2018-06-20 MED ORDER — LORATADINE 10 MG PO TABS: 10.0000 mg | ORAL_TABLET | Freq: Every day | ORAL | Status: DC | PRN

## 2018-06-20 NOTE — Progress Notes (Signed)
Patient Demographics:    [name], is a 58 y.o. male, DOB - Dec 03, 1960, KPT:[id number]  Admit date - 06/12/2018   Admitting Physician Phillips Grout, MD  Outpatient Primary MD for the patient is Patient, No Pcp Per  LOS - 8   Chief Complaint  Patient presents with  . Abdominal Pain        Subjective:    [name] today has no fevers, no emesis,  No chest pain, oral intake is fair, no further BM  Assessment  & Plan :    Principal Problem:   AKI (acute kidney injury) ([place]) Active Problems:   Tobacco abuse   Substance abuse ([place])   Personality disorder (HCC)   Panlobular emphysema (HCC)   Chronic left-sided low back pain with left-sided sciatica   Left lumbar radiculopathy   PNA (pneumonia)   Hypercalcemia   Macrocytic anemia   Lumbar epidural mass (HCC)   Kappa light chain myeloma (HCC)   Brief History: 58 year old male with a history of COPD, testicular cancer, substance abuse, spinal stenosis and lumbar radiculopathy status post fusion 07/24/2015([place])presenting with abdominal pain radiating to the bilateral flanks as well as nausea without any emesis or diarrhea. He denies any fevers, chills, chest pain, shortness of breath, coughing. Unfortunately, patient is a difficult historian and tends to be tangential. Nevertheless, he has also been complaining of worsening lower back pain and leg weakness. He has not fallen or had any syncope. He has been taking over-the-counter NSAIDs as well as tramadol. Patient had MRI of the lumbar spine on 05/06/2018 which showed abnormal right prevertebral and epidural soft tissue at L2-3 concerning for tumor infection. There is also right L3 nerve impingement. There is moderate L4-5 canal stenosis with moderate to severe L2-3 narrowing. The patient subsequently had L2 nerve block performed on 05/27/2018. Upon presentation, the patient was  noted to have hypercalcemia with a corrected calcium of 13.1. The patient was started on IV fluidswith some improvement of his serum calcium.Subsequently, the patient was started on calcitonin. Renal and heme/onc were consulted to assist with management.  Assessment/Plan: AKI -Etiology includes with myeloma Vs NSAIDs Vs hypercalcemia--or combination of all 3 -Renal ultrasound--neg hydronephrosis -SPEP--positive M-spike in gamma region -Serum Immunofixation--pending -serum kappa/lambda light chains--pending -urine immunofixation = kappa Bence Jones protein -baseline creatinine ~1.0 -urine protein/creatinine ratio--5.29 -appreciate nephrology input--nephrologist okay to continue IV fluids, Stopped IV Lasix on 06/18/18 Creatinine is 4.78  Kappa light chain multiple myeloma: Status post cyclophosphamide, bortezomib and dexamethasone with next treatment due next week.  Bone marrow biopsy result/myeloma FISH panel pending to help guide additional management.  Had chemo 06/17/2018  Hypercalcemia -Intact PTH--9 -likely due to underlying malignancy--data pointing to myeloma -Continue IV fluids as serum calcium is improving  status post calcitonin and ongoing denosumab-- Lasix discontinued by his nephrologist as of 06/18/2018 Calcium is down to 9.6 (corrected calcium is around 10) -SPEP--positive M-spike in gamma region -Immunofixation--pending -serum kappa/lambda light chains--pending -urine immunofixation = kappa Bence-Jones protein -heme/onc consult-->[place]rag[place]da spoke with IR after I spoke with Dr. Vernard Gambles (IR)-->only wants BM biopsy, cancelled L2-3 biopsy -completed 4 doses calcitonin [place]  Lumbar radiculopathy -05/06/2018 MRI L-spine--diffusely abnormal bone marrow signal concerning for myeloproliferative disorder versus infiltrative metastatic disease. Abnormal prevertebraltissue as discussed above  at L2-3 -3/16&3/17--discussed with neurosurgery, Dr. Melynda Keller nonoperative  managementfor radiculopathy -06/15/18--discussed with IR (Dr. Gaye Pollack for needle bx of L2-3 soft tissue-->placed order in Epic -Dr. Delton Coombes spoke with IR after I spoke with Dr. Vernard Gambles (IR)-->only wants BM biopsy, cancelled L2-3 biopsy -3/16 MR T-spine--no acute findings 3/16 MR L-spine--motion degraded with persistent paracentral soft tissue -06/15/18--ambulated pt--pt able to ambulate with minimal to no assist Pain control improving on Percocets,  Bilateral pleural effusions -Echocardiogram--probably normal EF, poor quality -Urine protein creatinine ratio--5.29 -Stable on room air  Proteinuria -pt has nephrotic range proteinuria -has contributed to pleural effusions -urine protein/creatinine ratio 5.29 -nephrology following--appreciated  COPD -Stable on room air -Patient states that he quit smoking 1 month PTA  Pulmonary opacity -CT abdomen and pelvis showed consolidation in the posterior left lower lobe -Patient has no fever or shortness of breath -Viral respiratory panel shows rhinovirus -procalcitonin--7.71 -CT chest--no consolidation, moderate bilateral pleural effusion with atelectasis. Multiple tiny bilateral nodules. -d/c ceftriaxone and azithromycin-->remains afebrile and hemodynamically stable  Abdominal Pain May be related to chronic constipation, patient with chronic pain issues -3/14 CT abd--mid sigmoid wall thickening withoutsurrounding inflammation--?muscular hypertrophy  Macrocytic anemia--likely due to multiple myeloma-- ?? If needs ESA/iron will defer to hematology and nephrology -Q11--941 -folic DEYC--1.4 -iron saturation 35%, ferritin 62 -transfused2 units 06/14/18 Hgb is down to 8.9 -started folate supplementation    Disposition Plan:Not stable for d/c Family Communication:daughter updated at bedside   Consultants:Renal/heme/onc and IR  Code Status: FULL   DVT Prophylaxis: [place] Heparin    Procedures: As  Listed in Progress Note Above  Antibiotics: Ceftriaxone 3/14>>>3/16 azithrommycin 3/14>>>3/16  Disposition/Need for in-Hospital Stay- patient unable to be discharged at this time due to need for IVF given hypercalcemia, acute kidney injury in the setting of multiple myeloma pending results... Nephrologist recommends additional IV fluids, creatinine trending up now up to 4.78,  Code Status : Full   Family Communication:   Daughter at bedside  DVT Prophylaxis  :   SCDs  Lab Results  Component Value Date   PLT 164 06/20/2018    Inpatient Medications  Scheduled Meds: . feeding supplement (ENSURE ENLIVE)  237 mL Oral BID BM  . folic acid  1 mg Oral Daily  . lidocaine  1 patch Transdermal Q24H  . polyethylene glycol  17 g Oral Once  . senna-docusate  2 tablet Oral BID  . sodium bicarbonate  650 mg Oral BID   Continuous Infusions: . sodium chloride 125 mL/hr at 06/20/18 1556   PRN Meds:.alum & mag hydroxide-simeth, hydrocortisone cream, loratadine, oxyCODONE-acetaminophen    Anti-infectives (From admission, onward)   Start     Dose/Rate Route Frequency Ordered Stop   06/12/18 2100  azithromycin (ZITHROMAX) 500 mg in sodium chloride 0.9 % 250 mL IVPB  Status:  Discontinued     500 mg 250 mL/hr over 60 Minutes Intravenous Every 24 hours 06/12/18 2011 06/15/18 0753   06/12/18 2100  cefTRIAXone (ROCEPHIN) 1 g in sodium chloride 0.9 % 100 mL IVPB  Status:  Discontinued     1 g 200 mL/hr over 30 Minutes Intravenous Every 24 hours 06/12/18 2054 06/12/18 2056   06/12/18 2100  azithromycin (ZITHROMAX) 500 mg in sodium chloride 0.9 % 250 mL IVPB  Status:  Discontinued     500 mg 250 mL/hr over 60 Minutes Intravenous Every 24 hours 06/12/18 2054 06/12/18 2056   06/12/18 2030  cefTRIAXone (ROCEPHIN) 1 g in sodium chloride 0.9 % 100 mL IVPB  Status:  Discontinued  1 g 200 mL/hr over 30 Minutes Intravenous Every 24 hours 06/12/18 2011 06/15/18 0753        Objective:   Vitals:    06/18/18 2148 06/19/18 1320 06/19/18 2300 06/20/18 0510  BP: 131/69 127/71 116/60 111/63  Pulse: 94 88 90 94  Resp: 18 18 18 18   Temp: 98.3 F (36.8 C) 98.6 F (37 C) 98.6 F (37 C) 98.3 F (36.8 C)  TempSrc: Oral   Oral  SpO2: 98% 98% 96% 98%  Weight:      Height:        Wt Readings from Last 3 Encounters:  06/12/18 77.1 kg  05/29/18 79.4 kg  04/15/18 93 kg     Intake/Output Summary (Last 24 hours) at 06/20/2018 1736 Last data filed at 06/20/2018 1300 Gross per 24 hour  Intake 720 ml  Output 400 ml  Net 320 ml     Physical Exam Patient is examined daily including today on 06/20/18 , exams remain the same as of yesterday except that has changed   Gen:- Awake Alert,  In no apparent distress  HEENT:- [place].AT, No sclera icterus Neck-Supple Neck,No JVD,.  Lungs-  CTAB , fair symmetrical air movement CV- S1, S2 normal, regular  Abd-  +ve B.Sounds, Abd Soft, No tenderness,    Extremity/Skin:- No  edema, pedal pulses present  Psych-affect is appropriate, oriented x3 Neuro-no new focal deficits, no tremors   Data Review:   Micro Results Recent Results (from the past 240 hour(s))  Culture, blood (routine x 2) Call MD if unable to obtain prior to antibiotics being given     Status: None   Collection Time: 06/12/18 10:41 PM  Result Value Ref Range Status   Specimen Description BLOOD RIGHT HAND  Final   Special Requests   Final    BOTTLES DRAWN AEROBIC AND ANAEROBIC Blood Culture adequate volume   Culture   Final    NO GROWTH 5 DAYS Performed at [facility], [address] [place], [place] [address]    Report Status 06/17/2018 FINAL  Final  Respiratory Panel by PCR     Status: Abnormal   Collection Time: 06/12/18 10:44 PM  Result Value Ref Range Status   Adenovirus NOT DETECTED NOT DETECTED Final   Coronavirus 229E NOT DETECTED NOT DETECTED Final    Comment: (NOTE) The Coronavirus on the Respiratory Panel, DOES NOT test for the novel  Coronavirus (2019 nCoV)     Coronavirus HKU1 NOT DETECTED NOT DETECTED Final   Coronavirus NL63 NOT DETECTED NOT DETECTED Final   Coronavirus OC43 NOT DETECTED NOT DETECTED Final   Metapneumovirus NOT DETECTED NOT DETECTED Final   Rhinovirus / Enterovirus DETECTED (A) NOT DETECTED Final   Influenza A NOT DETECTED NOT DETECTED Final   Influenza B NOT DETECTED NOT DETECTED Final   Parainfluenza Virus 1 NOT DETECTED NOT DETECTED Final   Parainfluenza Virus 2 NOT DETECTED NOT DETECTED Final   Parainfluenza Virus 3 NOT DETECTED NOT DETECTED Final   Parainfluenza Virus 4 NOT DETECTED NOT DETECTED Final   Respiratory Syncytial Virus NOT DETECTED NOT DETECTED Final   Bordetella pertussis NOT DETECTED NOT DETECTED Final   Chlamydophila pneumoniae NOT DETECTED NOT DETECTED Final   Mycoplasma pneumoniae NOT DETECTED NOT DETECTED Final    Comment: Performed at [facility], [place] [address], [place],  [address]  Culture, blood (routine x 2) Call MD if unable to obtain prior to antibiotics being given     Status: None  Collection Time: 06/12/18 10:58 PM  Result Value Ref Range Status   Specimen Description BLOOD LEFT ARM  Final   Special Requests   Final    BOTTLES DRAWN AEROBIC AND ANAEROBIC Blood Culture adequate volume   Culture   Final    NO GROWTH 5 DAYS Performed at [facility], [address], [place], [place] [address]    Report Status 06/17/2018 FINAL  Final  MRSA PCR Screening     Status: None   Collection Time: 06/13/18 12:20 AM  Result Value Ref Range Status   MRSA by PCR NEGATIVE NEGATIVE Final    Comment:        The GeneXpert MRSA Assay (FDA approved for NASAL specimens only), is one component of a comprehensive MRSA colonization surveillance program. It is not intended to diagnose MRSA infection nor to guide or monitor treatment for MRSA infections. Performed at [facility], [address], [place], [place] [address]     Radiology Reports Ct Abdomen Pelvis Wo Contrast  Result Date:  06/12/2018 CLINICAL DATA:  Abdominal pain and fever EXAM: CT ABDOMEN AND PELVIS WITHOUT CONTRAST TECHNIQUE: Multidetector CT imaging of the abdomen and pelvis was performed following the standard protocol without IV contrast. Oral contrast was administered. COMPARISON:  Aug 12, 2012 FINDINGS: Lower chest: There are free-flowing pleural effusions bilaterally with bibasilar atelectasis. There is questionable mild consolidation in the posterior left lung base as well. Hepatobiliary: No focal liver lesions are appreciable on this noncontrast enhanced study. The gallbladder wall is not appreciably thickened. There is no biliary duct dilatation. Pancreas: No pancreatic mass or inflammatory focus. Spleen: No splenic lesions are evident. Adrenals/Urinary Tract: Adrenals bilaterally appear unremarkable. Kidneys bilaterally show no evident mass or hydronephrosis on either side. There is no appreciable renal or ureteral calculus on either side. Urinary bladder is midline with wall thickness within normal limits. Stomach/Bowel: There are sigmoid diverticula without overt diverticulitis. There is wall thickening in portions of the mid sigmoid colon without surrounding inflammation. Suspect muscular hypertrophy in this area from chronic diverticulosis. No bowel wall thickening is noted elsewhere. No bowel obstruction appreciable. No free air or portal venous air. Vascular/Lymphatic: There is aortic and bilateral iliac artery atherosclerosis. No aneurysm evident. There is no appreciable adenopathy in the abdomen or pelvis. Reproductive: Prostate and seminal vesicles appear normal in size and contour. There is no appreciable pelvic mass. Other: Appendix appears normal. No abscess or ascites is evident in the abdomen or pelvis. Musculoskeletal: There is extensive postoperative change from L1-L4. There is arthropathy throughout the lumbar region. There are no blastic or lytic bone lesions. There is no intramuscular or abdominal wall  lesion evident. IMPRESSION: 1. Moderate free-flowing pleural effusions bilaterally with bibasilar atelectasis. Question a degree of superimposed pneumonia in the posterior left base. 2. Wall thickening in the mid sigmoid colon, likely due to muscular hypertrophy from chronic diverticulosis. Earliest changes of diverticulitis in this area are difficult to entirely exclude. No overt diverticulitis is evident. 3. No bowel obstruction. Appendix appears normal. No evident abscess in the abdomen or pelvis. 4. No evident renal or ureteral calculus. No evident hydronephrosis on either side. 5.  Aortoiliac atherosclerosis. 6.  Extensive postoperative change throughout the lumbar spine. Electronically Signed   By: Lowella Grip III M.D.   On: 06/12/2018 17:14   Dg Chest 2 View  Result Date: 06/12/2018 CLINICAL DATA:  Stomach pain for a week. No vomiting or diarrhea. Fever. EXAM: CHEST - 2 VIEW COMPARISON:  CT chest 05/28/2016. Chest  11/04/2010 FINDINGS: Normal heart size and pulmonary vascularity. Small left pleural effusion with infiltration in the left lung base. This suggests pneumonia. Right lung is clear. No pneumothorax. Mediastinal contours appear intact. IMPRESSION: Small left pleural effusion with infiltration in the left lung base suggesting pneumonia. Electronically Signed   By: Lucienne Capers M.D.   On: 06/12/2018 21:18   Ct Chest Wo Contrast  Result Date: 06/14/2018 CLINICAL DATA:  58 year old male with history of chest pain and pleural effusion. EXAM: CT CHEST WITHOUT CONTRAST TECHNIQUE: Multidetector CT imaging of the chest was performed following the standard protocol without IV contrast. COMPARISON:  Chest CT 05/20/2016.  Chest x-ray 06/12/2018. FINDINGS: Cardiovascular: Heart size is normal. There is no significant pericardial fluid, thickening or pericardial calcification. Aortic atherosclerosis. No coronary artery calcifications. Mediastinum/Nodes: No pathologically enlarged mediastinal or  hilar lymph nodes. Esophagus is unremarkable in appearance. No axillary lymphadenopathy. Lungs/Pleura: Multiple tiny 2-3 mm pulmonary nodules scattered throughout both lungs, several of which are calcified, compatible with benign granulomas. No larger more suspicious appearing pulmonary nodules or masses are noted. Moderate-sized bilateral pleural effusions lying dependently. This is associated with some mild passive atelectasis in the lower lobes of the lungs bilaterally. No acute consolidative airspace disease. Diffuse bronchial wall thickening with mild centrilobular and paraseptal emphysema. Upper Abdomen: Aortic atherosclerosis. Musculoskeletal: There are no aggressive appearing lytic or blastic lesions noted in the visualized portions of the skeleton. IMPRESSION: 1. Moderate bilateral pleural effusions lying dependently with areas of passive subsegmental atelectasis in the lower lobes of the lungs bilaterally. 2. Multiple tiny 2-3 mm pulmonary nodules scattered throughout the lungs bilaterally, nonspecific, but similar to prior study from 05/28/2016, considered definitively benign. 3. Aortic atherosclerosis. 4. Diffuse bronchial wall thickening with mild centrilobular and paraseptal emphysema; imaging findings suggestive of underlying COPD. Aortic Atherosclerosis (ICD10-I70.0) and Emphysema (ICD10-J43.9). Electronically Signed   By: Vinnie Langton M.D.   On: 06/14/2018 10:53   Mr Thoracic Spine Wo Contrast  Result Date: 06/14/2018 CLINICAL DATA:  Mid low back pain for 2 months EXAM: MRI THORACIC AND LUMBAR SPINE WITHOUT CONTRAST TECHNIQUE: Multiplanar and multiecho pulse sequences of the thoracic and lumbar spine were obtained without intravenous contrast. COMPARISON:  Lumbar spine MRI 05/06/2018 FINDINGS: Severe patient motion degrading image quality limiting evaluation. Portion of the examination are nondiagnostic. MRI THORACIC SPINE FINDINGS Alignment:  Physiologic. Vertebrae: No fracture, evidence of  discitis, or bone lesion. Cord:  Normal signal and morphology. Paraspinal and other soft tissues: No acute paraspinal abnormality. Disc levels: Disc spaces:  Disc spaces are relatively preserved. C6-7: Mild broad-based disc bulge. No foraminal or central canal stenosis. T1-T2: No disc protrusion, foraminal stenosis or central canal stenosis. T2-T3: No disc protrusion, foraminal stenosis or central canal stenosis. T3-T4: Broad-based disc bulge. Moderate bilateral facet arthropathy. Mild spinal stenosis. T4-T5: No disc protrusion, foraminal stenosis or central canal stenosis. Bilateral mild facet arthropathy. T5-T6: No disc protrusion, foraminal stenosis or central canal stenosis. T6-T7: No disc protrusion, foraminal stenosis or central canal stenosis. T7-T8: No disc protrusion, foraminal stenosis or central canal stenosis. T8-T9: No disc protrusion, foraminal stenosis or central canal stenosis. T9-T10: No disc protrusion, foraminal stenosis or central canal stenosis. T10-T11: No disc protrusion, foraminal stenosis or central canal stenosis. T11-T12: No disc protrusion, foraminal stenosis or central canal stenosis. MRI LUMBAR SPINE FINDINGS Segmentation:  Standard. Alignment:  Physiologic. Vertebrae: No fracture, evidence of discitis, or aggressive bone lesion. Low marrow signal throughout the lumbar spine which is limited in evaluation secondary to patient motion. Conus  medullaris and cauda equina: Conus extends to the T12 level. Conus and cauda equina appear normal. Paraspinal and other soft tissues: No acute paraspinal abnormality. Disc levels: Disc spaces: Posterior lumbar interbody fusion from L1 through L4. T12-L1: Minimal broad-based disc bulge. Mild bilateral facet arthropathy. No evidence of neural foraminal stenosis. No central canal stenosis. L1-L2: Interbody fusion. No evidence of neural foraminal stenosis. No central canal stenosis. L2-L3: Interbody fusion. Evaluation is extremely limited secondary to  patient motion degrading image quality. There is suggestion of persistent right central/right paracentral epidural soft tissue, but characterization is extremely difficult for given the degree of patient motion. Right foraminal stenosis. No central canal stenosis. L3-L4: Interbody fusion. No evidence of neural foraminal stenosis. No central canal stenosis. L4-L5: Minimal broad-based disc bulge. Mild bilateral facet scratch them moderate bilateral facet arthropathy. Mild left foraminal stenosis. No right foraminal stenosis. No central canal stenosis. L5-S1: Broad-based disc bulge. Moderate bilateral facet arthropathy. Mild left foraminal stenosis. No significant right foraminal stenosis. No central canal stenosis. IMPRESSION: Severe patient motion degrading image quality limiting evaluation. Portion of the examination are nondiagnostic. MR THORACIC SPINE IMPRESSION 1. No aggressive osseous lesion to suggest malignancy. 2.  No acute osseous injury of the thoracic spine. MR LUMBAR SPINE IMPRESSION 1. L2-3 is extremely limited in evaluation secondary to patient motion degrading image quality. There is suggestion of persistent right central/right paracentral epidural soft tissue, but characterization is extremely difficult for given the degree of patient motion. Right foraminal stenosis. Recommend repeat MRI of the lumbar spine when the patient is able to better tolerate the examination with less patient motion. 2.  No acute osseous injury of the lumbar spine. Electronically Signed   By: Kathreen Devoid   On: 06/14/2018 19:51   Mr Lumbar Spine Wo Contrast  Result Date: 06/14/2018 CLINICAL DATA:  Mid low back pain for 2 months EXAM: MRI THORACIC AND LUMBAR SPINE WITHOUT CONTRAST TECHNIQUE: Multiplanar and multiecho pulse sequences of the thoracic and lumbar spine were obtained without intravenous contrast. COMPARISON:  Lumbar spine MRI 05/06/2018 FINDINGS: Severe patient motion degrading image quality limiting  evaluation. Portion of the examination are nondiagnostic. MRI THORACIC SPINE FINDINGS Alignment:  Physiologic. Vertebrae: No fracture, evidence of discitis, or bone lesion. Cord:  Normal signal and morphology. Paraspinal and other soft tissues: No acute paraspinal abnormality. Disc levels: Disc spaces:  Disc spaces are relatively preserved. C6-7: Mild broad-based disc bulge. No foraminal or central canal stenosis. T1-T2: No disc protrusion, foraminal stenosis or central canal stenosis. T2-T3: No disc protrusion, foraminal stenosis or central canal stenosis. T3-T4: Broad-based disc bulge. Moderate bilateral facet arthropathy. Mild spinal stenosis. T4-T5: No disc protrusion, foraminal stenosis or central canal stenosis. Bilateral mild facet arthropathy. T5-T6: No disc protrusion, foraminal stenosis or central canal stenosis. T6-T7: No disc protrusion, foraminal stenosis or central canal stenosis. T7-T8: No disc protrusion, foraminal stenosis or central canal stenosis. T8-T9: No disc protrusion, foraminal stenosis or central canal stenosis. T9-T10: No disc protrusion, foraminal stenosis or central canal stenosis. T10-T11: No disc protrusion, foraminal stenosis or central canal stenosis. T11-T12: No disc protrusion, foraminal stenosis or central canal stenosis. MRI LUMBAR SPINE FINDINGS Segmentation:  Standard. Alignment:  Physiologic. Vertebrae: No fracture, evidence of discitis, or aggressive bone lesion. Low marrow signal throughout the lumbar spine which is limited in evaluation secondary to patient motion. Conus medullaris and cauda equina: Conus extends to the T12 level. Conus and cauda equina appear normal. Paraspinal and other soft tissues: No acute paraspinal abnormality. Disc levels: Disc spaces:  Posterior lumbar interbody fusion from L1 through L4. T12-L1: Minimal broad-based disc bulge. Mild bilateral facet arthropathy. No evidence of neural foraminal stenosis. No central canal stenosis. L1-L2: Interbody  fusion. No evidence of neural foraminal stenosis. No central canal stenosis. L2-L3: Interbody fusion. Evaluation is extremely limited secondary to patient motion degrading image quality. There is suggestion of persistent right central/right paracentral epidural soft tissue, but characterization is extremely difficult for given the degree of patient motion. Right foraminal stenosis. No central canal stenosis. L3-L4: Interbody fusion. No evidence of neural foraminal stenosis. No central canal stenosis. L4-L5: Minimal broad-based disc bulge. Mild bilateral facet scratch them moderate bilateral facet arthropathy. Mild left foraminal stenosis. No right foraminal stenosis. No central canal stenosis. L5-S1: Broad-based disc bulge. Moderate bilateral facet arthropathy. Mild left foraminal stenosis. No significant right foraminal stenosis. No central canal stenosis. IMPRESSION: Severe patient motion degrading image quality limiting evaluation. Portion of the examination are nondiagnostic. MR THORACIC SPINE IMPRESSION 1. No aggressive osseous lesion to suggest malignancy. 2.  No acute osseous injury of the thoracic spine. MR LUMBAR SPINE IMPRESSION 1. L2-3 is extremely limited in evaluation secondary to patient motion degrading image quality. There is suggestion of persistent right central/right paracentral epidural soft tissue, but characterization is extremely difficult for given the degree of patient motion. Right foraminal stenosis. Recommend repeat MRI of the lumbar spine when the patient is able to better tolerate the examination with less patient motion. 2.  No acute osseous injury of the lumbar spine. Electronically Signed   By: Kathreen Devoid   On: 06/14/2018 19:51   US Renal  Result Date: 06/14/2018 CLINICAL DATA:  Acute on chronic renal failure EXAM: RENAL / URINARY TRACT ULTRASOUND COMPLETE COMPARISON:  06/12/2018 FINDINGS: Right Kidney: Renal measurements: 11.6 x 4.9 x 7.6 cm = volume: 224 mL. Diffuse increased  echogenicity is noted. No mass or hydronephrosis is noted. Left Kidney: Renal measurements: 11.9 x 6.5 x 5.5 cm = volume: 222 mL. Increased echogenicity is noted. No mass lesion or hydronephrosis is seen. Bladder: Appears normal for degree of bladder distention. Bilateral pleural effusions are noted similar to that seen on prior CT. IMPRESSION: Diffuse increased echogenicity consistent with the given clinical history. Bilateral small effusions. Electronically Signed   By: Inez Catalina M.D.   On: 06/14/2018 10:30   Ct Biopsy  Result Date: 06/16/2018 INDICATION: 58 year old male referred for bone marrow biopsy with history of possible multiple myeloma EXAM: CT BONE MARROW BIOPSY AND ASPIRATION; CT BIOPSY MEDICATIONS: None. ANESTHESIA/SEDATION: Moderate (conscious) sedation was employed during this procedure. A total of Versed 3.0 mg and Fentanyl 100 mcg was administered intravenously. Moderate Sedation Time: 10 minutes. The patient's level of consciousness and vital signs were monitored continuously by radiology nursing throughout the procedure under my direct supervision. FLUOROSCOPY TIME:  CT COMPLICATIONS: None PROCEDURE: The procedure risks, benefits, and alternatives were explained to the patient. Questions regarding the procedure were encouraged and answered. The patient understands and consents to the procedure. Scout CT of the pelvis was performed for surgical planning purposes. The posterior pelvis was prepped with Chlorhexidine in a sterile fashion, and a sterile drape was applied covering the operative field. A sterile gown and sterile gloves were used for the procedure. Local anesthesia was provided with 1% Lidocaine. Posterior left iliac bone was targeted for biopsy. The skin and subcutaneous tissues were infiltrated with 1% lidocaine without epinephrine. A small stab incision was made with an 11 blade scalpel, and an 11 gauge Murphy needle was advanced with  CT guidance to the posterior cortex. Manual  forced was used to advance the needle through the posterior cortex and the stylet was removed. A bone marrow aspirate was retrieved and passed to a cytotechnologist in the room. The initial aspirate was negative for visualized spicules/bone particles. Secondary aspirate was performed with heparin. The Murphy needle was then advanced without the stylet for a core biopsy. The core biopsy was retrieved and also passed to a cytotechnologist. A second core biopsy was achieved given the absence of bone particles. Manual pressure was used for hemostasis and a sterile dressing was placed. No complications were encountered no significant blood loss was encountered. Patient tolerated the procedure well and remained hemodynamically stable throughout. IMPRESSION: Status post CT-guided bone marrow biopsy, with tissue specimen sent to pathology for complete histopathologic analysis Signed, Dulcy Fanny. Earleen Newport, DO [facility] Radiology Electronically Signed   By: Corrie Mckusick D.O.   On: 06/16/2018 12:03   Dg Bone Survey Met  Result Date: 06/15/2018 CLINICAL DATA:  58 year old male with a history of hypercalcemia EXAM: METASTATIC BONE SURVEY COMPARISON:  Chest CT 06/14/2018, plain film 06/12/2018, lumbar CT 05/06/2018 FINDINGS: Skull: No acute fracture. No lytic or sclerotic lesion identified.  Endodontal disease. Cervical spine: Surgical changes of anterior cervical discectomy infusion with anterior plate screw fixation [id number] with no complicating features. Degenerative changes at the levels above and below. No lytic lesions or sclerotic lesions.  Alignment maintained. Thoracic spine: Vertebral body heights maintained with alignment. Mild disc disease of the thoracic spine. No acute fracture. No lytic or sclerotic lesions. Unremarkable appearance of the pedicles. Lumbar spine: Surgical changes of posterior lumbar interbody fusion with bilateral pedicle screw and rod fixation  spanning L1-L4. No complicating features. No acute fracture. The lytic changes that were identified on prior CT are not visualized on the current plain film. Moderate disc disease and facet disease of the lumbar spine. No lytic or sclerotic lesions identified. Chest: Cardiomediastinal silhouette within normal limits. Blunting of the bilateral costophrenic angles with obscuration of the hemidiaphragms bilaterally. No interlobular septal thickening. No rib fracture identified. Pelvis: No acute fracture. Sclerotic changes at the inferior aspect of the left sacroiliac joint on the iliac aspect, similar to prior CT abdomen. No lytic changes. Degenerative changes of the hips. Right upper extremity: No acute fracture.  No lucent lesion or sclerotic focus. Left upper extremity: No acute fracture.  No lucent lesion or sclerotic changes. Right lower extremity: No acute fracture.  No no lucent lesion or sclerotic lesion. Left lower extremity: No acute fracture.  No lucent lesion or sclerotic lesion. IMPRESSION: Abnormal lucent changes identified in the lumbar spine on the prior CT dated 05/06/2018 are not visualized on the current plain film. If concern for malignancy, correlation with either nuclear medicine bone scan or PET-CT may be useful. Sclerotic focus at the inferior aspect of the left sacral iliac joint, present on prior CT pelvis. If concern for malignancy, correlation with either nuclear medicine bone scan or PET-CT may be useful. Bilateral small pleural effusions, present on prior CT. Electronically Signed   By: Corrie Mckusick D.O.   On: 06/15/2018 09:22   Ct Bone Marrow Biopsy & Aspiration  Result Date: 06/16/2018 INDICATION: 58 year old male referred for bone marrow biopsy with history of possible multiple myeloma EXAM: CT BONE MARROW BIOPSY AND ASPIRATION; CT BIOPSY MEDICATIONS: None. ANESTHESIA/SEDATION: Moderate (conscious) sedation was employed during this procedure. A total of Versed 3.0 mg and Fentanyl  100 mcg was administered intravenously.  Moderate Sedation Time: 10 minutes. The patient's level of consciousness and vital signs were monitored continuously by radiology nursing throughout the procedure under my direct supervision. FLUOROSCOPY TIME:  CT COMPLICATIONS: None PROCEDURE: The procedure risks, benefits, and alternatives were explained to the patient. Questions regarding the procedure were encouraged and answered. The patient understands and consents to the procedure. Scout CT of the pelvis was performed for surgical planning purposes. The posterior pelvis was prepped with Chlorhexidine in a sterile fashion, and a sterile drape was applied covering the operative field. A sterile gown and sterile gloves were used for the procedure. Local anesthesia was provided with 1% Lidocaine. Posterior left iliac bone was targeted for biopsy. The skin and subcutaneous tissues were infiltrated with 1% lidocaine without epinephrine. A small stab incision was made with an 11 blade scalpel, and an 11 gauge Murphy needle was advanced with CT guidance to the posterior cortex. Manual forced was used to advance the needle through the posterior cortex and the stylet was removed. A bone marrow aspirate was retrieved and passed to a cytotechnologist in the room. The initial aspirate was negative for visualized spicules/bone particles. Secondary aspirate was performed with heparin. The Murphy needle was then advanced without the stylet for a core biopsy. The core biopsy was retrieved and also passed to a cytotechnologist. A second core biopsy was achieved given the absence of bone particles. Manual pressure was used for hemostasis and a sterile dressing was placed. No complications were encountered no significant blood loss was encountered. Patient tolerated the procedure well and remained hemodynamically stable throughout. IMPRESSION: Status post CT-guided bone marrow biopsy, with tissue specimen sent to pathology for complete  histopathologic analysis Signed, Dulcy Fanny. Earleen Newport, DO [facility] Radiology Electronically Signed   By: Corrie Mckusick D.O.   On: 06/16/2018 12:03   Dg Inject Diag/thera/inc Needle/cath/plc Epi/lumb/sac W/img  Result Date: 05/27/2018 CLINICAL DATA:  Previous fusion surgery. Back and right lower extremity pain. EXAM: SELECTIVE NERVE ROOT BLOCK AND TRANSFORAMINAL EPIDURAL STEROID INJECTION UNDER FLUOROSCOPY FLUOROSCOPY TIME:  43 seconds; 41 uGym2 DAP TECHNIQUE: The procedure, risks (including but not limited to bleeding, infection, organ damage ), benefits, and alternatives were explained to the patient. Questions regarding the procedure were encouraged and answered. The patient understands and consents to the procedure. An appropriate skin entry site was determined under fluoroscopy. Operator donned sterile gloves and mask. Site was marked, prepped with Betadine, draped in usual sterile fashion, infiltrated locally with 1% lidocaine. A 22 gauge spinal needle was advanced to the superior ventral margin of the right L2-3 neural foramen. Diagnostic injection of 2 ml Omnipaque 180 showed partial outlining of the exiting nerve root as well as minimal epidural extension of contrast, with no intravascular or subarachnoid component. 120 mg Depo-Medrol in 3 ml lidocaine 1% was administered. The patient tolerated procedure well, with no immediate complication. IMPRESSION: 1. Technically successful right L2 selective nerve root block and transforaminal epidural steroid injection Electronically Signed   By: Lucrezia Europe M.D.   On: 05/27/2018 15:28     CBC Recent Labs  Lab 06/14/18 0548 06/15/18 0534 06/16/18 0503 06/17/18 0424 06/20/18 0612  WBC 10.7* 12.0* 11.0* 9.6 11.7*  HGB 7.0* 9.8* 9.4* 9.4* 8.9*  HCT 22.5* 29.7* 28.7* 27.7* 27.9*  PLT 165 150 158 155 164  MCV 111.4* 100.3* 101.1* 100.4* 105.7*  MCH 34.7* 33.1 33.1 34.1* 33.7  MCHC 31.1 33.0 32.8 33.9 31.9   RDW 15.5 20.1* 19.6* 18.9* 18.4*  LYMPHSABS  4.1* 4.9* 4.4* 3.4  --   MONOABS 2.0* 2.7* 1.8* 2.1*  --   EOSABS 0.1 0.1 0.1 0.1  --   BASOSABS 0.1 0.1 0.1 0.1  --     Chemistries  Recent Labs  Lab 06/15/18 0534 06/16/18 0503 06/17/18 0424 06/18/18 0623 06/19/18 0606 06/20/18 0603  NA 134* 134* 135  134* 134* 134* 137  K 4.6 4.3 4.1  4.1 4.0 3.8 4.2  CL 101 99 99  97* 97* 99 102  CO2 18* 21* 22  22 22  21* 22  GLUCOSE 80 98 84  85 132* 86 80  BUN 63* 57* 58*  58* 68* 76* 76*  CREATININE 4.92* 4.50* 4.30*  4.33* 4.46* 4.68* 4.78*  CALCIUM 12.9* 12.0* 11.6*  11.6* 10.7* 9.8 9.6  AST 22  --  20  --   --   --   ALT 19  --  18  --   --   --   ALKPHOS 82  --  71  --   --   --   BILITOT 0.3  --  0.5  --   --   --    ------------------------------------------------------------------------------------------------------------------ No results for input(s): CHOL, HDL, LDLCALC, TRIG, CHOLHDL, LDLDIRECT in the last 72 hours.  No results found for: HGBA1C ------------------------------------------------------------------------------------------------------------------ No results for input(s): TSH, T4TOTAL, T3FREE, THYROIDAB in the last 72 hours.  Invalid input(s): FREET3 ------------------------------------------------------------------------------------------------------------------ No results for input(s): VITAMINB12, FOLATE, FERRITIN, TIBC, IRON, RETICCTPCT in the last 72 hours.  Coagulation profile Recent Labs  Lab 06/15/18 1153  INR 1.0    No results for input(s): DDIMER in the last 72 hours.  Cardiac Enzymes No results for input(s): CKMB, TROPONINI, MYOGLOBIN in the last 168 hours.  Invalid input(s): CK ------------------------------------------------------------------------------------------------------------------ No results found for: BNP   Roxan Hockey M.D on 06/20/2018 at 5:36 PM  Go to [email or web address] - for contact info  [facility] - Office   [phone]

## 2018-06-21 LAB — RENAL FUNCTION PANEL
Albumin: 3.6 g/dL (ref 3.5–5.0)
Anion gap: 12 (ref 5–15)
BUN: 76 mg/dL — ABNORMAL HIGH (ref 6–20)
CO2: 19 mmol/L — ABNORMAL LOW (ref 22–32)
Calcium: 9.6 mg/dL (ref 8.9–10.3)
Chloride: 105 mmol/L (ref 98–111)
Creatinine, Ser: 4.8 mg/dL — ABNORMAL HIGH (ref 0.61–1.24)
GFR calc Af Amer: 14 mL/min — ABNORMAL LOW (ref >60)
GFR calc non Af Amer: 12 mL/min — ABNORMAL LOW (ref >60)
Glucose, Bld: 89 mg/dL (ref 70–99)
Phosphorus: 4.2 mg/dL (ref 2.5–4.6)
Potassium: 4.5 mmol/L (ref 3.5–5.1)
Sodium: 136 mmol/L (ref 135–145)

## 2018-06-21 MED ORDER — POLYETHYLENE GLYCOL 3350 17 G PO PACK
17.0000 g | PACK | Freq: Every day | ORAL | Status: DC
  Administered 2018-06-21 – 2018-06-22 (×2): 17 g via ORAL
  Filled 2018-06-21 (×3): qty 1

## 2018-06-21 MED ORDER — BISACODYL 5 MG PO TBEC
5.0000 mg | DELAYED_RELEASE_TABLET | Freq: Every day | ORAL | Status: DC | PRN
  Administered 2018-06-21: 5 mg via ORAL
  Filled 2018-06-21: qty 1

## 2018-06-21 MED ORDER — BISACODYL 10 MG RE SUPP: 10.0000 mg | Freq: Once | RECTAL | Status: DC

## 2018-06-21 MED ORDER — CALCIUM CARBONATE ANTACID 500 MG PO CHEW
1.0000 | CHEWABLE_TABLET | Freq: Once | ORAL | Status: AC
  Administered 2018-06-21: 200 mg via ORAL
  Filled 2018-06-21: qty 1

## 2018-06-21 NOTE — Consult Note (Signed)
[facility] Oncology Progress Note  Name: [name]      MRN: [id number]    Location: Q722/V750-51  Date: 06/21/2018 Time:2:30 PM   Subjective: Interval History:[name] is seen this afternoon.  He is sitting by the side of the bed.  He feels much better today.  His back pain is under better control.  Denied any nausea vomiting.  Reports drinking recommended fluids daily.  He reportedly had some diarrhea when he was drinking Ensure.  Objective: Vital signs in last 24 hours: Temp:  [98.2 F (36.8 C)-98.7 F (37.1 C)] 98.4 F (36.9 C) (03/23 1423) Pulse Rate:  [101-104] 104 (03/23 1423) Resp:  [16-18] 16 (03/23 1423) BP: (111-129)/(63-80) 111/65 (03/23 1423) SpO2:  [94 %-98 %] 97 % (03/23 1423)    Intake/Output from previous day: 03/22 0800 - 03/23 0759 In: 480 [P.O.:480] Out: -     Intake/Output this shift: Total I/O In: 300 [P.O.:300] Out: 350 [Urine:350]   PHYSICAL EXAM: BP 111/65 (BP Location: Right Arm)   Pulse (!) 104   Temp 98.4 F (36.9 C) (Oral)   Resp 16   Ht 6' (1.829 m)   Wt 170 lb (77.1 kg)   SpO2 97%   BMI 23.06 kg/m  Throat: lips, mucosa, and tongue normal; teeth and gums normal Lungs: clear to auscultation bilaterally Heart: regular rate and rhythm Abdomen: soft, non-tender; bowel sounds normal; no masses,  no organomegaly Extremities: extremities normal, atraumatic, no cyanosis or edema Skin: Skin color, texture, turgor normal. No rashes or lesions Lymph nodes: Cervical, supraclavicular, and axillary nodes normal. Neurologic: Grossly normal   Studies/Results: Results for orders placed or performed during the hospital encounter of 06/12/18 (from the past 48 hour(s))  Renal function panel     Status: Abnormal   Collection Time: 06/20/18  6:03 AM  Result Value Ref Range   Sodium 137 135 - 145 mmol/L   Potassium 4.2 3.5 - 5.1 mmol/L   Chloride 102 98 - 111 mmol/L   CO2 22 22 - 32 mmol/L   Glucose, Bld 80 70 - 99 mg/dL   BUN  76 (H) 6 - 20 mg/dL   Creatinine, Ser 4.78 (H) 0.61 - 1.24 mg/dL   Calcium 9.6 8.9 - 10.3 mg/dL   Phosphorus 5.1 (H) 2.5 - 4.6 mg/dL   Albumin 3.6 3.5 - 5.0 g/dL   GFR calc non Af Amer 12 (L) >60 mL/min   GFR calc Af Amer 14 (L) >60 mL/min   Anion gap 13 5 - 15    Comment: Performed at [facility], [address] [place], [place] [address]  CBC     Status: Abnormal   Collection Time: 06/20/18  6:12 AM  Result Value Ref Range   WBC 11.7 (H) 4.0 - 10.5 K/uL   RBC 2.64 (L) 4.22 - 5.81 MIL/uL   Hemoglobin 8.9 (L) 13.0 - 17.0 g/dL   HCT 27.9 (L) 39.0 - 52.0 %   MCV 105.7 (H) 80.0 - 100.0 fL   MCH 33.7 26.0 - 34.0 pg   MCHC 31.9 30.0 - 36.0 g/dL   RDW 18.4 (H) 11.5 - 15.5 %   Platelets 164 150 - 400 K/uL   nRBC 1.8 (H) 0.0 - 0.2 %    Comment: Performed at [facility], [address], [place], [place] [address]  Renal function panel     Status: Abnormal   Collection Time: 06/21/18  5:44 AM  Result Value Ref Range   Sodium 136 135 -  145 mmol/L   Potassium 4.5 3.5 - 5.1 mmol/L   Chloride 105 98 - 111 mmol/L   CO2 19 (L) 22 - 32 mmol/L   Glucose, Bld 89 70 - 99 mg/dL   BUN 76 (H) 6 - 20 mg/dL   Creatinine, Ser 4.80 (H) 0.61 - 1.24 mg/dL   Calcium 9.6 8.9 - 10.3 mg/dL   Phosphorus 4.2 2.5 - 4.6 mg/dL   Albumin 3.6 3.5 - 5.0 g/dL   GFR calc non Af Amer 12 (L) >60 mL/min   GFR calc Af Amer 14 (L) >60 mL/min   Anion gap 12 5 - 15    Comment: Performed at [facility], [address] [place], [place] [address]   No results found.   MEDICATIONS: I have reviewed the patient's current medications.     Assessment/Plan:  1.  Kappa light chain myeloma: -Bone marrow biopsy on 06/16/2018 shows 87% plasma cells.  FISH panel is pending. -Kappa free light chains elevated at 12,085, lambda light chain 6.1, free light chain ratio of 1981. 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  SPEP shows 0.4 g of monoclonal protein. -Bone survey on 3 72,020 showed no  lytic lesions. - C1 D1 of cyclophosphamide, bortezomib and dexamethasone on 06/17/2018. -We will plan to continue day treatment this week. -We will follow-up on the [facility] panel on the bone marrow biopsy.  2.  Hypercalcemia: -he presented with hypercalcemia of 13.1. -he received 1 dose of Xgeva along with calcitonin twice daily.  He also received IV hydration which was discontinued today. - Calcium improved to 9.6.  3.  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 - Creatinine today is 4.8.  He is nonoliguric.  4.  Macrocytic anemia: -Hemoglobin today is 8.9.  MCV is 105.7. - If hemoglobin drops below 8, will consider erythropoiesis stimulating agents.  All questions were answered. The patient knows to call the clinic with any problems, questions or concerns. We can certainly see the patient much sooner if necessary.     Derek Jack

## 2018-06-21 NOTE — Progress Notes (Signed)
Patient Demographics:    [name], is a 58 y.o. male, DOB - 1961-02-22, YTK:[id number]  Admit date - 06/12/2018   Admitting Physician Phillips Grout, MD  Outpatient Primary MD for the patient is Patient, No Pcp Per  LOS - 9   Chief Complaint  Patient presents with  . Abdominal Pain        Subjective:    [name] today has no fevers, no emesis, complains of chronic back pain, had BM x1  Assessment  & Plan :    Principal Problem:   AKI (acute kidney injury) ([place]) Active Problems:   Tobacco abuse   Substance abuse ([place])   Personality disorder ([place])   Panlobular emphysema ([place])   Chronic left-sided low back pain with left-sided sciatica   Left lumbar radiculopathy   PNA (pneumonia)   Hypercalcemia   Macrocytic anemia   Lumbar epidural mass ([facility])   Kappa light chain myeloma ([facility])   Brief History: 58 year old male with a history of COPD, substance abuse, spinal stenosis and lumbar radiculopathy status post fusion 07/24/2015([place])presenting with abdominal pain radiating to the bilateral flanks as well as nausea without any emesis or diarrhea. He denies any fevers, chills, chest pain, shortness of breath, coughing. Patient had MRI of the lumbar spine on 05/06/2018 which showed abnormal right prevertebral and epidural soft tissue at L2-3 concerning for tumor infection. There is also right L3 nerve impingement. There is moderate L4-5 canal stenosis with moderate to severe L2-3 narrowing. The patient subsequently had L2 nerve block performed on 05/27/2018. Admitted on 06/12/2018 with back pain and hypercalcemia with corrected calcium of 13.1 and acute AKI on CKD--in the setting of NSAID use and multiple myeloma-- Renal and heme/onc were consulted to assist with management.  Assessment/Plan: AKI  -Etiology includes with myeloma Vs NSAIDs Vs hypercalcemia--or combination of all  3 -Renal ultrasound--neg hydronephrosis -SPEP--positive M-spike in gamma region -Serum Immunofixation--pending -serum kappa/lambda light chains--elevated -urine immunofixation = kappa Bence Jones protein, kappa type. SPEP shows 0.4g of monoclonal protein -baseline creatinine ~1.0 -urine protein/creatinine ratio--5.29 -appreciate nephrology input--nephrologist okay to STOP IV fluids on 06/21/18, Stopped IV Lasix on 06/18/18 Creatinine is 4.8 (peak was 6.14)    Kappa light chain multiple myeloma: Kappa free light chains elevated to 12,085, lambda light chain 6.1, free light chain ratio of 1981- Bone marrow biopsy on 06/16/18 with 87% plasma cells, Kappa restricted;  Skeletal bone survey without lytic lesion  status post chemo with cyclophosphamide, bortezomib and dexamethasone on 06/17/18,  with next treatment due this week.  Bone marrow biopsy result/myeloma FISH panel pending to help guide additional management.    Hypercalcemia -Intact PTH--9 On admission corrected calcium was 13.1 -likely due to myeloma -Status post Xgeva along with calcitonin bid ---  Lasix discontinued by his nephrologist as of 06/18/2018, IVF continued 06/21/2018 Calcium is down to 9.6 (corrected calcium is around 10) -SPEP--positive M-spike in gamma region/SPEP shows 0.4g of monoclonal protein -serum kappa/lambda light chains--pending -urine immunofixation = kappa Bence-Jones protein -heme/onc consult-->[place] spoke with IR after I spoke with Dr. Vernard Gambles (IR)-->only wants BM biopsy, cancelled L2-3 biopsy -completed 4 doses calcitonin [place]  Lumbar radiculopathy -05/06/2018 MRI L-spine--diffusely abnormal bone marrow signal concerning for myeloproliferative disorder versus infiltrative metastatic disease. Abnormal prevertebraltissue as  discussed above at L2-3 -3/16&3/17--discussed with neurosurgery, Dr. Melynda Keller nonoperative managementfor radiculopathy -06/15/18--discussed with IR (Dr. Gaye Pollack  for needle bx of L2-3 soft tissue-->placed order in Epic -Dr. Delton Coombes spoke with IR after I spoke with Dr. Vernard Gambles (IR)-->only wants BM biopsy, cancelled L2-3 biopsy -3/16 MR T-spine--no acute findings 3/16 MR L-spine--motion degraded with persistent paracentral soft tissue -06/15/18--ambulated pt--pt able to ambulate with minimal to no assist Pain control improving on Percocets,  Bilateral pleural effusions -Echocardiogram--probably normal EF, poor quality -Urine protein creatinine ratio--5.29 -Stable on room air  Proteinuria -pt has nephrotic range proteinuria -has contributed to pleural effusions -urine protein/creatinine ratio 5.29 -nephrology following--appreciated  COPD -Stable on room air -Patient states that he quit smoking 1 month PTA  Pulmonary opacity -CT abdomen and pelvis showed consolidation in the posterior left lower lobe -Patient has no fever or shortness of breath -Viral respiratory panel shows rhinovirus -procalcitonin--7.71 -CT chest--no consolidation, moderate bilateral pleural effusion with atelectasis. Multiple tiny bilateral nodules. -d/c ceftriaxone and azithromycin-->remains afebrile and hemodynamically stable  Abdominal Pain May be related to chronic constipation, patient with chronic pain issues -3/14 CT abd--mid sigmoid wall thickening withoutsurrounding inflammation--?muscular hypertrophy  Macrocytic anemia--likely due to multiple myeloma--  Per hematology okay to consider ESA if Hgb < 8 will defer to hematology and nephrology -X72--620 -folic BTDH--7.4 -iron saturation 35%, ferritin 62 -transfused2 units 06/14/18 Hgb is down to 8.9 -started folate supplementation    Disposition Plan: PT eval  family Communication:daughter updated at bedside   Consultants:Renal/heme/onc and IR  Code Status: FULL   DVT Prophylaxis: [place] Heparin   Disposition--- possible discharge home in 1 to 2 days if renal function remains  stable without IV fluids as per nephrology  Procedures: As Listed in Progress Note Above  Antibiotics: Ceftriaxone 3/14>>>3/16 azithrommycin 3/14>>>3/16    Code Status : Full   Family Communication:   Daughter at bedside  DVT Prophylaxis  :   SCDs  Lab Results  Component Value Date   PLT 164 06/20/2018    Inpatient Medications  Scheduled Meds: . bisacodyl  10 mg Rectal Once  . feeding supplement (ENSURE ENLIVE)  237 mL Oral BID BM  . folic acid  1 mg Oral Daily  . lidocaine  1 patch Transdermal Q24H  . polyethylene glycol  17 g Oral Daily  . senna-docusate  2 tablet Oral BID   Continuous Infusions:  PRN Meds:.bisacodyl, hydrocortisone cream, loratadine, oxyCODONE-acetaminophen    Anti-infectives (From admission, onward)   Start     Dose/Rate Route Frequency Ordered Stop   06/12/18 2100  azithromycin (ZITHROMAX) 500 mg in sodium chloride 0.9 % 250 mL IVPB  Status:  Discontinued     500 mg 250 mL/hr over 60 Minutes Intravenous Every 24 hours 06/12/18 2011 06/15/18 0753   06/12/18 2100  cefTRIAXone (ROCEPHIN) 1 g in sodium chloride 0.9 % 100 mL IVPB  Status:  Discontinued     1 g 200 mL/hr over 30 Minutes Intravenous Every 24 hours 06/12/18 2054 06/12/18 2056   06/12/18 2100  azithromycin (ZITHROMAX) 500 mg in sodium chloride 0.9 % 250 mL IVPB  Status:  Discontinued     500 mg 250 mL/hr over 60 Minutes Intravenous Every 24 hours 06/12/18 2054 06/12/18 2056   06/12/18 2030  cefTRIAXone (ROCEPHIN) 1 g in sodium chloride 0.9 % 100 mL IVPB  Status:  Discontinued     1 g 200 mL/hr over 30 Minutes Intravenous Every 24 hours 06/12/18 2011 06/15/18 0753  Objective:   Vitals:   06/20/18 2116 06/21/18 0554 06/21/18 0804 06/21/18 1423  BP: 114/80 129/63  111/65  Pulse: (!) 103 (!) 101  (!) 104  Resp: 18 18  16   Temp: 98.2 F (36.8 C) 98.7 F (37.1 C)  98.4 F (36.9 C)  TempSrc: Oral Oral  Oral  SpO2: 98% 94% 96% 97%  Weight:      Height:        Wt  Readings from Last 3 Encounters:  06/12/18 77.1 kg  05/29/18 79.4 kg  04/15/18 93 kg     Intake/Output Summary (Last 24 hours) at 06/21/2018 1537 Last data filed at 06/21/2018 1300 Gross per 24 hour  Intake 540 ml  Output 350 ml  Net 190 ml     Physical Exam Patient is examined daily including today on 06/21/18 , exams remain the same as of yesterday except that has changed   Gen:- Awake Alert,  In no apparent distress  HEENT:- [place].AT, No sclera icterus Neck-Supple Neck,No JVD,.  Lungs-  CTAB , fair symmetrical air movement CV- S1, S2 normal, regular  Abd-  +ve B.Sounds, Abd Soft, No tenderness,    Extremity/Skin:- No  edema, pedal pulses present  Psych-affect is appropriate, oriented x3 Neuro-no new focal deficits, no tremors   Data Review:   Micro Results Recent Results (from the past 240 hour(s))  Culture, blood (routine x 2) Call MD if unable to obtain prior to antibiotics being given     Status: None   Collection Time: 06/12/18 10:41 PM  Result Value Ref Range Status   Specimen Description BLOOD RIGHT HAND  Final   Special Requests   Final    BOTTLES DRAWN AEROBIC AND ANAEROBIC Blood Culture adequate volume   Culture   Final    NO GROWTH 5 DAYS Performed at [facility], [address], [place], [place] [address]    Report Status 06/17/2018 FINAL  Final  Respiratory Panel by PCR     Status: Abnormal   Collection Time: 06/12/18 10:44 PM  Result Value Ref Range Status   Adenovirus NOT DETECTED NOT DETECTED Final   Coronavirus 229E NOT DETECTED NOT DETECTED Final    Comment: (NOTE) The Coronavirus on the Respiratory Panel, DOES NOT test for the novel  Coronavirus (2019 nCoV)    Coronavirus HKU1 NOT DETECTED NOT DETECTED Final   Coronavirus NL63 NOT DETECTED NOT DETECTED Final   Coronavirus OC43 NOT DETECTED NOT DETECTED Final   Metapneumovirus NOT DETECTED NOT DETECTED Final   Rhinovirus / Enterovirus DETECTED (A) NOT DETECTED Final   Influenza A NOT DETECTED  NOT DETECTED Final   Influenza B NOT DETECTED NOT DETECTED Final   Parainfluenza Virus 1 NOT DETECTED NOT DETECTED Final   Parainfluenza Virus 2 NOT DETECTED NOT DETECTED Final   Parainfluenza Virus 3 NOT DETECTED NOT DETECTED Final   Parainfluenza Virus 4 NOT DETECTED NOT DETECTED Final   Respiratory Syncytial Virus NOT DETECTED NOT DETECTED Final   Bordetella pertussis NOT DETECTED NOT DETECTED Final   Chlamydophila pneumoniae NOT DETECTED NOT DETECTED Final   Mycoplasma pneumoniae NOT DETECTED NOT DETECTED Final    Comment: Performed at [facility], [place] [address] [place], [place] [address]  Culture, blood (routine x 2) Call MD if unable to obtain prior to antibiotics being given     Status: None   Collection Time: 06/12/18 10:58 PM  Result Value Ref Range Status   Specimen Description BLOOD LEFT ARM  Final  Special Requests   Final    BOTTLES DRAWN AEROBIC AND ANAEROBIC Blood Culture adequate volume   Culture   Final    NO GROWTH 5 DAYS Performed at [facility], [address], [place], [place] [address]    Report Status 06/17/2018 FINAL  Final  MRSA PCR Screening     Status: None   Collection Time: 06/13/18 12:20 AM  Result Value Ref Range Status   MRSA by PCR NEGATIVE NEGATIVE Final    Comment:        The GeneXpert MRSA Assay (FDA approved for NASAL specimens only), is one component of a comprehensive MRSA colonization surveillance program. It is not intended to diagnose MRSA infection nor to guide or monitor treatment for MRSA infections. Performed at [facility], [address], [place], [place] [address]     Radiology Reports Ct Abdomen Pelvis Wo Contrast  Result Date: 06/12/2018 CLINICAL DATA:  Abdominal pain and fever EXAM: CT ABDOMEN AND PELVIS WITHOUT CONTRAST TECHNIQUE: Multidetector CT imaging of the abdomen and pelvis was performed following the standard protocol without IV contrast. Oral contrast was administered. COMPARISON:  Aug 12, 2012  FINDINGS: Lower chest: There are free-flowing pleural effusions bilaterally with bibasilar atelectasis. There is questionable mild consolidation in the posterior left lung base as well. Hepatobiliary: No focal liver lesions are appreciable on this noncontrast enhanced study. The gallbladder wall is not appreciably thickened. There is no biliary duct dilatation. Pancreas: No pancreatic mass or inflammatory focus. Spleen: No splenic lesions are evident. Adrenals/Urinary Tract: Adrenals bilaterally appear unremarkable. Kidneys bilaterally show no evident mass or hydronephrosis on either side. There is no appreciable renal or ureteral calculus on either side. Urinary bladder is midline with wall thickness within normal limits. Stomach/Bowel: There are sigmoid diverticula without overt diverticulitis. There is wall thickening in portions of the mid sigmoid colon without surrounding inflammation. Suspect muscular hypertrophy in this area from chronic diverticulosis. No bowel wall thickening is noted elsewhere. No bowel obstruction appreciable. No free air or portal venous air. Vascular/Lymphatic: There is aortic and bilateral iliac artery atherosclerosis. No aneurysm evident. There is no appreciable adenopathy in the abdomen or pelvis. Reproductive: Prostate and seminal vesicles appear normal in size and contour. There is no appreciable pelvic mass. Other: Appendix appears normal. No abscess or ascites is evident in the abdomen or pelvis. Musculoskeletal: There is extensive postoperative change from L1-L4. There is arthropathy throughout the lumbar region. There are no blastic or lytic bone lesions. There is no intramuscular or abdominal wall lesion evident. IMPRESSION: 1. Moderate free-flowing pleural effusions bilaterally with bibasilar atelectasis. Question a degree of superimposed pneumonia in the posterior left base. 2. Wall thickening in the mid sigmoid colon, likely due to muscular hypertrophy from chronic  diverticulosis. Earliest changes of diverticulitis in this area are difficult to entirely exclude. No overt diverticulitis is evident. 3. No bowel obstruction. Appendix appears normal. No evident abscess in the abdomen or pelvis. 4. No evident renal or ureteral calculus. No evident hydronephrosis on either side. 5.  Aortoiliac atherosclerosis. 6.  Extensive postoperative change throughout the lumbar spine. Electronically Signed   By: Lowella Grip III M.D.   On: 06/12/2018 17:14   Dg Chest 2 View  Result Date: 06/12/2018 CLINICAL DATA:  Stomach pain for a week. No vomiting or diarrhea. Fever. EXAM: CHEST - 2 VIEW COMPARISON:  CT chest 05/28/2016. Chest 11/04/2010 FINDINGS: Normal heart size and pulmonary vascularity. Small left pleural effusion with infiltration in the left lung base. This suggests pneumonia.  Right lung is clear. No pneumothorax. Mediastinal contours appear intact. IMPRESSION: Small left pleural effusion with infiltration in the left lung base suggesting pneumonia. Electronically Signed   By: Lucienne Capers M.D.   On: 06/12/2018 21:18   Ct Chest Wo Contrast  Result Date: 06/14/2018 CLINICAL DATA:  58 year old male with history of chest pain and pleural effusion. EXAM: CT CHEST WITHOUT CONTRAST TECHNIQUE: Multidetector CT imaging of the chest was performed following the standard protocol without IV contrast. COMPARISON:  Chest CT 05/20/2016.  Chest x-ray 06/12/2018. FINDINGS: Cardiovascular: Heart size is normal. There is no significant pericardial fluid, thickening or pericardial calcification. Aortic atherosclerosis. No coronary artery calcifications. Mediastinum/Nodes: No pathologically enlarged mediastinal or hilar lymph nodes. Esophagus is unremarkable in appearance. No axillary lymphadenopathy. Lungs/Pleura: Multiple tiny 2-3 mm pulmonary nodules scattered throughout both lungs, several of which are calcified, compatible with benign granulomas. No larger more suspicious appearing  pulmonary nodules or masses are noted. Moderate-sized bilateral pleural effusions lying dependently. This is associated with some mild passive atelectasis in the lower lobes of the lungs bilaterally. No acute consolidative airspace disease. Diffuse bronchial wall thickening with mild centrilobular and paraseptal emphysema. Upper Abdomen: Aortic atherosclerosis. Musculoskeletal: There are no aggressive appearing lytic or blastic lesions noted in the visualized portions of the skeleton. IMPRESSION: 1. Moderate bilateral pleural effusions lying dependently with areas of passive subsegmental atelectasis in the lower lobes of the lungs bilaterally. 2. Multiple tiny 2-3 mm pulmonary nodules scattered throughout the lungs bilaterally, nonspecific, but similar to prior study from 05/28/2016, considered definitively benign. 3. Aortic atherosclerosis. 4. Diffuse bronchial wall thickening with mild centrilobular and paraseptal emphysema; imaging findings suggestive of underlying COPD. Aortic Atherosclerosis (ICD10-I70.0) and Emphysema (ICD10-J43.9). Electronically Signed   By: Vinnie Langton M.D.   On: 06/14/2018 10:53   Mr Thoracic Spine Wo Contrast  Result Date: 06/14/2018 CLINICAL DATA:  Mid low back pain for 2 months EXAM: MRI THORACIC AND LUMBAR SPINE WITHOUT CONTRAST TECHNIQUE: Multiplanar and multiecho pulse sequences of the thoracic and lumbar spine were obtained without intravenous contrast. COMPARISON:  Lumbar spine MRI 05/06/2018 FINDINGS: Severe patient motion degrading image quality limiting evaluation. Portion of the examination are nondiagnostic. MRI THORACIC SPINE FINDINGS Alignment:  Physiologic. Vertebrae: No fracture, evidence of discitis, or bone lesion. Cord:  Normal signal and morphology. Paraspinal and other soft tissues: No acute paraspinal abnormality. Disc levels: Disc spaces:  Disc spaces are relatively preserved. C6-7: Mild broad-based disc bulge. No foraminal or central canal stenosis. T1-T2:  No disc protrusion, foraminal stenosis or central canal stenosis. T2-T3: No disc protrusion, foraminal stenosis or central canal stenosis. T3-T4: Broad-based disc bulge. Moderate bilateral facet arthropathy. Mild spinal stenosis. T4-T5: No disc protrusion, foraminal stenosis or central canal stenosis. Bilateral mild facet arthropathy. T5-T6: No disc protrusion, foraminal stenosis or central canal stenosis. T6-T7: No disc protrusion, foraminal stenosis or central canal stenosis. T7-T8: No disc protrusion, foraminal stenosis or central canal stenosis. T8-T9: No disc protrusion, foraminal stenosis or central canal stenosis. T9-T10: No disc protrusion, foraminal stenosis or central canal stenosis. T10-T11: No disc protrusion, foraminal stenosis or central canal stenosis. T11-T12: No disc protrusion, foraminal stenosis or central canal stenosis. MRI LUMBAR SPINE FINDINGS Segmentation:  Standard. Alignment:  Physiologic. Vertebrae: No fracture, evidence of discitis, or aggressive bone lesion. Low marrow signal throughout the lumbar spine which is limited in evaluation secondary to patient motion. Conus medullaris and cauda equina: Conus extends to the T12 level. Conus and cauda equina appear normal. Paraspinal and other soft tissues: No  acute paraspinal abnormality. Disc levels: Disc spaces: Posterior lumbar interbody fusion from L1 through L4. T12-L1: Minimal broad-based disc bulge. Mild bilateral facet arthropathy. No evidence of neural foraminal stenosis. No central canal stenosis. L1-L2: Interbody fusion. No evidence of neural foraminal stenosis. No central canal stenosis. L2-L3: Interbody fusion. Evaluation is extremely limited secondary to patient motion degrading image quality. There is suggestion of persistent right central/right paracentral epidural soft tissue, but characterization is extremely difficult for given the degree of patient motion. Right foraminal stenosis. No central canal stenosis. L3-L4: Interbody  fusion. No evidence of neural foraminal stenosis. No central canal stenosis. L4-L5: Minimal broad-based disc bulge. Mild bilateral facet scratch them moderate bilateral facet arthropathy. Mild left foraminal stenosis. No right foraminal stenosis. No central canal stenosis. L5-S1: Broad-based disc bulge. Moderate bilateral facet arthropathy. Mild left foraminal stenosis. No significant right foraminal stenosis. No central canal stenosis. IMPRESSION: Severe patient motion degrading image quality limiting evaluation. Portion of the examination are nondiagnostic. MR THORACIC SPINE IMPRESSION 1. No aggressive osseous lesion to suggest malignancy. 2.  No acute osseous injury of the thoracic spine. MR LUMBAR SPINE IMPRESSION 1. L2-3 is extremely limited in evaluation secondary to patient motion degrading image quality. There is suggestion of persistent right central/right paracentral epidural soft tissue, but characterization is extremely difficult for given the degree of patient motion. Right foraminal stenosis. Recommend repeat MRI of the lumbar spine when the patient is able to better tolerate the examination with less patient motion. 2.  No acute osseous injury of the lumbar spine. Electronically Signed   By: Kathreen Devoid   On: 06/14/2018 19:51   Mr Lumbar Spine Wo Contrast  Result Date: 06/14/2018 CLINICAL DATA:  Mid low back pain for 2 months EXAM: MRI THORACIC AND LUMBAR SPINE WITHOUT CONTRAST TECHNIQUE: Multiplanar and multiecho pulse sequences of the thoracic and lumbar spine were obtained without intravenous contrast. COMPARISON:  Lumbar spine MRI 05/06/2018 FINDINGS: Severe patient motion degrading image quality limiting evaluation. Portion of the examination are nondiagnostic. MRI THORACIC SPINE FINDINGS Alignment:  Physiologic. Vertebrae: No fracture, evidence of discitis, or bone lesion. Cord:  Normal signal and morphology. Paraspinal and other soft tissues: No acute paraspinal abnormality. Disc levels:  Disc spaces:  Disc spaces are relatively preserved. C6-7: Mild broad-based disc bulge. No foraminal or central canal stenosis. T1-T2: No disc protrusion, foraminal stenosis or central canal stenosis. T2-T3: No disc protrusion, foraminal stenosis or central canal stenosis. T3-T4: Broad-based disc bulge. Moderate bilateral facet arthropathy. Mild spinal stenosis. T4-T5: No disc protrusion, foraminal stenosis or central canal stenosis. Bilateral mild facet arthropathy. T5-T6: No disc protrusion, foraminal stenosis or central canal stenosis. T6-T7: No disc protrusion, foraminal stenosis or central canal stenosis. T7-T8: No disc protrusion, foraminal stenosis or central canal stenosis. T8-T9: No disc protrusion, foraminal stenosis or central canal stenosis. T9-T10: No disc protrusion, foraminal stenosis or central canal stenosis. T10-T11: No disc protrusion, foraminal stenosis or central canal stenosis. T11-T12: No disc protrusion, foraminal stenosis or central canal stenosis. MRI LUMBAR SPINE FINDINGS Segmentation:  Standard. Alignment:  Physiologic. Vertebrae: No fracture, evidence of discitis, or aggressive bone lesion. Low marrow signal throughout the lumbar spine which is limited in evaluation secondary to patient motion. Conus medullaris and cauda equina: Conus extends to the T12 level. Conus and cauda equina appear normal. Paraspinal and other soft tissues: No acute paraspinal abnormality. Disc levels: Disc spaces: Posterior lumbar interbody fusion from L1 through L4. T12-L1: Minimal broad-based disc bulge. Mild bilateral facet arthropathy. No evidence of neural foraminal  stenosis. No central canal stenosis. L1-L2: Interbody fusion. No evidence of neural foraminal stenosis. No central canal stenosis. L2-L3: Interbody fusion. Evaluation is extremely limited secondary to patient motion degrading image quality. There is suggestion of persistent right central/right paracentral epidural soft tissue, but  characterization is extremely difficult for given the degree of patient motion. Right foraminal stenosis. No central canal stenosis. L3-L4: Interbody fusion. No evidence of neural foraminal stenosis. No central canal stenosis. L4-L5: Minimal broad-based disc bulge. Mild bilateral facet scratch them moderate bilateral facet arthropathy. Mild left foraminal stenosis. No right foraminal stenosis. No central canal stenosis. L5-S1: Broad-based disc bulge. Moderate bilateral facet arthropathy. Mild left foraminal stenosis. No significant right foraminal stenosis. No central canal stenosis. IMPRESSION: Severe patient motion degrading image quality limiting evaluation. Portion of the examination are nondiagnostic. MR THORACIC SPINE IMPRESSION 1. No aggressive osseous lesion to suggest malignancy. 2.  No acute osseous injury of the thoracic spine. MR LUMBAR SPINE IMPRESSION 1. L2-3 is extremely limited in evaluation secondary to patient motion degrading image quality. There is suggestion of persistent right central/right paracentral epidural soft tissue, but characterization is extremely difficult for given the degree of patient motion. Right foraminal stenosis. Recommend repeat MRI of the lumbar spine when the patient is able to better tolerate the examination with less patient motion. 2.  No acute osseous injury of the lumbar spine. Electronically Signed   By: Kathreen Devoid   On: 06/14/2018 19:51   US Renal  Result Date: 06/14/2018 CLINICAL DATA:  Acute on chronic renal failure EXAM: RENAL / URINARY TRACT ULTRASOUND COMPLETE COMPARISON:  06/12/2018 FINDINGS: Right Kidney: Renal measurements: 11.6 x 4.9 x 7.6 cm = volume: 224 mL. Diffuse increased echogenicity is noted. No mass or hydronephrosis is noted. Left Kidney: Renal measurements: 11.9 x 6.5 x 5.5 cm = volume: 222 mL. Increased echogenicity is noted. No mass lesion or hydronephrosis is seen. Bladder: Appears normal for degree of bladder distention. Bilateral  pleural effusions are noted similar to that seen on prior CT. IMPRESSION: Diffuse increased echogenicity consistent with the given clinical history. Bilateral small effusions. Electronically Signed   By: Inez Catalina M.D.   On: 06/14/2018 10:30   Ct Biopsy  Result Date: 06/16/2018 INDICATION: 58 year old male referred for bone marrow biopsy with history of possible multiple myeloma EXAM: CT BONE MARROW BIOPSY AND ASPIRATION; CT BIOPSY MEDICATIONS: None. ANESTHESIA/SEDATION: Moderate (conscious) sedation was employed during this procedure. A total of Versed 3.0 mg and Fentanyl 100 mcg was administered intravenously. Moderate Sedation Time: 10 minutes. The patient's level of consciousness and vital signs were monitored continuously by radiology nursing throughout the procedure under my direct supervision. FLUOROSCOPY TIME:  CT COMPLICATIONS: None PROCEDURE: The procedure risks, benefits, and alternatives were explained to the patient. Questions regarding the procedure were encouraged and answered. The patient understands and consents to the procedure. Scout CT of the pelvis was performed for surgical planning purposes. The posterior pelvis was prepped with Chlorhexidine in a sterile fashion, and a sterile drape was applied covering the operative field. A sterile gown and sterile gloves were used for the procedure. Local anesthesia was provided with 1% Lidocaine. Posterior left iliac bone was targeted for biopsy. The skin and subcutaneous tissues were infiltrated with 1% lidocaine without epinephrine. A small stab incision was made with an 11 blade scalpel, and an 11 gauge Murphy needle was advanced with CT guidance to the posterior cortex. Manual forced was used to advance the needle through the posterior cortex and the stylet was  removed. A bone marrow aspirate was retrieved and passed to a cytotechnologist in the room. The initial aspirate was negative for visualized spicules/bone particles. Secondary aspirate  was performed with heparin. The Murphy needle was then advanced without the stylet for a core biopsy. The core biopsy was retrieved and also passed to a cytotechnologist. A second core biopsy was achieved given the absence of bone particles. Manual pressure was used for hemostasis and a sterile dressing was placed. No complications were encountered no significant blood loss was encountered. Patient tolerated the procedure well and remained hemodynamically stable throughout. IMPRESSION: Status post CT-guided bone marrow biopsy, with tissue specimen sent to pathology for complete histopathologic analysis Signed, Dulcy Fanny. Earleen Newport, DO [facility] Radiology Electronically Signed   By: Corrie Mckusick D.O.   On: 06/16/2018 12:03   Dg Bone Survey Met  Result Date: 06/15/2018 CLINICAL DATA:  58 year old male with a history of hypercalcemia EXAM: METASTATIC BONE SURVEY COMPARISON:  Chest CT 06/14/2018, plain film 06/12/2018, lumbar CT 05/06/2018 FINDINGS: Skull: No acute fracture. No lytic or sclerotic lesion identified.  Endodontal disease. Cervical spine: Surgical changes of anterior cervical discectomy infusion with anterior plate screw fixation U4-Q0 with no complicating features. Degenerative changes at the levels above and below. No lytic lesions or sclerotic lesions.  Alignment maintained. Thoracic spine: Vertebral body heights maintained with alignment. Mild disc disease of the thoracic spine. No acute fracture. No lytic or sclerotic lesions. Unremarkable appearance of the pedicles. Lumbar spine: Surgical changes of posterior lumbar interbody fusion with bilateral pedicle screw and rod fixation spanning L1-L4. No complicating features. No acute fracture. The lytic changes that were identified on prior CT are not visualized on the current plain film. Moderate disc disease and facet disease of the lumbar spine. No lytic or sclerotic lesions identified. Chest:  Cardiomediastinal silhouette within normal limits. Blunting of the bilateral costophrenic angles with obscuration of the hemidiaphragms bilaterally. No interlobular septal thickening. No rib fracture identified. Pelvis: No acute fracture. Sclerotic changes at the inferior aspect of the left sacroiliac joint on the iliac aspect, similar to prior CT abdomen. No lytic changes. Degenerative changes of the hips. Right upper extremity: No acute fracture.  No lucent lesion or sclerotic focus. Left upper extremity: No acute fracture.  No lucent lesion or sclerotic changes. Right lower extremity: No acute fracture.  No no lucent lesion or sclerotic lesion. Left lower extremity: No acute fracture.  No lucent lesion or sclerotic lesion. IMPRESSION: Abnormal lucent changes identified in the lumbar spine on the prior CT dated 05/06/2018 are not visualized on the current plain film. If concern for malignancy, correlation with either nuclear medicine bone scan or PET-CT may be useful. Sclerotic focus at the inferior aspect of the left sacral iliac joint, present on prior CT pelvis. If concern for malignancy, correlation with either nuclear medicine bone scan or PET-CT may be useful. Bilateral small pleural effusions, present on prior CT. Electronically Signed   By: Corrie Mckusick D.O.   On: 06/15/2018 09:22   Ct Bone Marrow Biopsy & Aspiration  Result Date: 06/16/2018 INDICATION: 58 year old male referred for bone marrow biopsy with history of possible multiple myeloma EXAM: CT BONE MARROW BIOPSY AND ASPIRATION; CT BIOPSY MEDICATIONS: None. ANESTHESIA/SEDATION: Moderate (conscious) sedation was employed during this procedure. A total of Versed 3.0 mg and Fentanyl 100 mcg was administered intravenously. Moderate Sedation Time: 10 minutes. The patient's level of consciousness and vital signs were monitored continuously by radiology nursing throughout the procedure under  my direct supervision. FLUOROSCOPY TIME:  CT COMPLICATIONS:  None PROCEDURE: The procedure risks, benefits, and alternatives were explained to the patient. Questions regarding the procedure were encouraged and answered. The patient understands and consents to the procedure. Scout CT of the pelvis was performed for surgical planning purposes. The posterior pelvis was prepped with Chlorhexidine in a sterile fashion, and a sterile drape was applied covering the operative field. A sterile gown and sterile gloves were used for the procedure. Local anesthesia was provided with 1% Lidocaine. Posterior left iliac bone was targeted for biopsy. The skin and subcutaneous tissues were infiltrated with 1% lidocaine without epinephrine. A small stab incision was made with an 11 blade scalpel, and an 11 gauge Murphy needle was advanced with CT guidance to the posterior cortex. Manual forced was used to advance the needle through the posterior cortex and the stylet was removed. A bone marrow aspirate was retrieved and passed to a cytotechnologist in the room. The initial aspirate was negative for visualized spicules/bone particles. Secondary aspirate was performed with heparin. The Murphy needle was then advanced without the stylet for a core biopsy. The core biopsy was retrieved and also passed to a cytotechnologist. A second core biopsy was achieved given the absence of bone particles. Manual pressure was used for hemostasis and a sterile dressing was placed. No complications were encountered no significant blood loss was encountered. Patient tolerated the procedure well and remained hemodynamically stable throughout. IMPRESSION: Status post CT-guided bone marrow biopsy, with tissue specimen sent to pathology for complete histopathologic analysis Signed, Dulcy Fanny. Earleen Newport, DO [facility] Radiology Electronically Signed   By: Corrie Mckusick D.O.   On: 06/16/2018 12:03   Dg Inject Diag/thera/inc Needle/cath/plc Epi/lumb/sac W/img  Result  Date: 05/27/2018 CLINICAL DATA:  Previous fusion surgery. Back and right lower extremity pain. EXAM: SELECTIVE NERVE ROOT BLOCK AND TRANSFORAMINAL EPIDURAL STEROID INJECTION UNDER FLUOROSCOPY FLUOROSCOPY TIME:  43 seconds; 74 uGym2 DAP TECHNIQUE: The procedure, risks (including but not limited to bleeding, infection, organ damage ), benefits, and alternatives were explained to the patient. Questions regarding the procedure were encouraged and answered. The patient understands and consents to the procedure. An appropriate skin entry site was determined under fluoroscopy. Operator donned sterile gloves and mask. Site was marked, prepped with Betadine, draped in usual sterile fashion, infiltrated locally with 1% lidocaine. A 22 gauge spinal needle was advanced to the superior ventral margin of the right L2-3 neural foramen. Diagnostic injection of 2 ml Omnipaque 180 showed partial outlining of the exiting nerve root as well as minimal epidural extension of contrast, with no intravascular or subarachnoid component. 120 mg Depo-Medrol in 3 ml lidocaine 1% was administered. The patient tolerated procedure well, with no immediate complication. IMPRESSION: 1. Technically successful right L2 selective nerve root block and transforaminal epidural steroid injection Electronically Signed   By: Lucrezia Europe M.D.   On: 05/27/2018 15:28     CBC Recent Labs  Lab 06/15/18 0534 06/16/18 0503 06/17/18 0424 06/20/18 0612  WBC 12.0* 11.0* 9.6 11.7*  HGB 9.8* 9.4* 9.4* 8.9*  HCT 29.7* 28.7* 27.7* 27.9*  PLT 150 158 155 164  MCV 100.3* 101.1* 100.4* 105.7*  MCH 33.1 33.1 34.1* 33.7  MCHC 33.0 32.8 33.9 31.9  RDW 20.1* 19.6* 18.9* 18.4*  LYMPHSABS 4.9* 4.4* 3.4  --   MONOABS 2.7* 1.8* 2.1*  --   EOSABS 0.1 0.1 0.1  --   BASOSABS 0.1 0.1 0.1  --  Chemistries  Recent Labs  Lab 06/15/18 0534  06/17/18 0424 06/18/18 0623 06/19/18 0606 06/20/18 0603 06/21/18 0544  NA 134*   < > 135  134* 134* 134* 137 136  K  4.6   < > 4.1  4.1 4.0 3.8 4.2 4.5  CL 101   < > 99  97* 97* 99 102 105  CO2 18*   < > 22  22 22  21* 22 19*  GLUCOSE 80   < > 84  85 132* 86 80 89  BUN 63*   < > 58*  58* 68* 76* 76* 76*  CREATININE 4.92*   < > 4.30*  4.33* 4.46* 4.68* 4.78* 4.80*  CALCIUM 12.9*   < > 11.6*  11.6* 10.7* 9.8 9.6 9.6  AST 22  --  20  --   --   --   --   ALT 19  --  18  --   --   --   --   ALKPHOS 82  --  71  --   --   --   --   BILITOT 0.3  --  0.5  --   --   --   --    < > = values in this interval not displayed.   ------------------------------------------------------------------------------------------------------------------ No results for input(s): CHOL, HDL, LDLCALC, TRIG, CHOLHDL, LDLDIRECT in the last 72 hours.  No results found for: HGBA1C ------------------------------------------------------------------------------------------------------------------ No results for input(s): TSH, T4TOTAL, T3FREE, THYROIDAB in the last 72 hours.  Invalid input(s): FREET3 ------------------------------------------------------------------------------------------------------------------ No results for input(s): VITAMINB12, FOLATE, FERRITIN, TIBC, IRON, RETICCTPCT in the last 72 hours.  Coagulation profile Recent Labs  Lab 06/15/18 1153  INR 1.0    No results for input(s): DDIMER in the last 72 hours.  Cardiac Enzymes No results for input(s): CKMB, TROPONINI, MYOGLOBIN in the last 168 hours.  Invalid input(s): CK ------------------------------------------------------------------------------------------------------------------ No results found for: BNP   Roxan Hockey M.D on 06/21/2018 at 3:37 PM  Go to [email or web address] - for contact info  [facility] - Office  [phone]

## 2018-06-21 NOTE — Progress Notes (Signed)
Patient ID: [name], male   DOB: 06-20-1960, 58 y.o.   MRN: [id number] [facility] Progress Note   Assessment/ Plan:   1. Acute kidney Injury: Most likely myeloma associated renal injury versus acute injury from hypercalcemia.  Renal function / GFR stable over weekend.  Calcium is corrected and has started on MM therapy.  Stop NS today, Continue to avoid nonsteroidal anti-inflammatory drugs and iodinated intravenous contrast. 2.  Hypercalcemia: Calcium level normalized status post calcitonin and prolia.  Stop NS as above 3.  Kappa light chain multiple myeloma: Status post cyclophosphamide, bortezomib and dexamethasone with next treatment due again this week.  Bone marrow biopsy Bx 87% plasma cells, Kappa restricted;  4.  Hyponatremia: resolved 5.  Hyperphosphatemia: improved off binders 6.  Anemia: Secondary to multiple myeloma, with low iron saturation and elevated ferritin noted.  ESA/iron therapy per hematology.    Subjective:    Feeling down overall; worried about leaving hospital  Inc LEE  No measured UOP  Stable SCr, K.Remains on NS + NaHCo3     Objective:   BP 129/63 (BP Location: Right Arm)   Pulse (!) 101   Temp 98.7 F (37.1 C) (Oral)   Resp 18   Ht 6' (1.829 m)   Wt 77.1 kg   SpO2 96%   BMI 23.06 kg/m   Intake/Output Summary (Last 24 hours) at 06/21/2018 0836 Last data filed at 06/20/2018 1300 Gross per 24 hour  Intake 480 ml  Output -  Net 480 ml   Weight change:   Physical Exam: Gen: sitting at edge of bed PSYCH: down/depressed mood/affect CVS: Pulse regular tachycardia, S1 and S2 normal, no murmur/rub Resp: Clear to auscultation bilaterally, no rales or rhonchi Abd: Soft, flat, nontender Ext: 2+ lower extremity edema  Imaging: No results found.  Labs: BMET Recent Labs  Lab 06/15/18 0534 06/16/18 0503 06/17/18 0424 06/18/18 3545 06/19/18 0606 06/20/18 0603 06/21/18 0544  NA 134* 134* 135  134* 134* 134* 137 136  K  4.6 4.3 4.1  4.1 4.0 3.8 4.2 4.5  CL 101 99 99  97* 97* 99 102 105  CO2 18* 21* _0 21* 22 19*  GLUCOSE 80 98 84  85 132* 86 80 89  BUN 63* 57* 58*  58* 68* 76* 76* 76*  CREATININE 4.92* 4.50* 4.30*  4.33* 4.46* 4.68* 4.78* 4.80*  CALCIUM 12.9* 12.0* 11.6*  11.6* 10.7* 9.8 9.6 9.6  PHOS 6.0* 6.2* 6.3* 6.4* 4.9* 5.1* 4.2   CBC Recent Labs  Lab 06/15/18 0534 06/16/18 0503 06/17/18 0424 06/20/18 0612  WBC 12.0* 11.0* 9.6 11.7*  NEUTROABS 3.9 4.3 3.7  --   HGB 9.8* 9.4* 9.4* 8.9*  HCT 29.7* 28.7* 27.7* 27.9*  MCV 100.3* 101.1* 100.4* 105.7*  PLT 150 158 155 164   Medications:    . feeding supplement (ENSURE ENLIVE)  237 mL Oral BID BM  . folic acid  1 mg Oral Daily  . lidocaine  1 patch Transdermal Q24H  . polyethylene glycol  17 g Oral Once  . senna-docusate  2 tablet Oral BID   Rexene Agent  06/21/2018, 8:36 AM

## 2018-06-21 NOTE — Progress Notes (Signed)
Pt refused to get off the toilet. PT educated about fall and safety. PT returned to bed with one assist. Pt reports constipation and urge to stool. Pt reports flatus. PT last documented BM on 06/17/18. Contacted MD and received order of Dulcolax. Dulcolax give with water. 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 Printed material provided on side effects of the Lidocaine patches. Continue to educate and monitor.

## 2018-06-22 LAB — RENAL FUNCTION PANEL
Albumin: 3.6 g/dL (ref 3.5–5.0)
Anion gap: 12 (ref 5–15)
BUN: 76 mg/dL — ABNORMAL HIGH (ref 6–20)
CO2: 21 mmol/L — ABNORMAL LOW (ref 22–32)
Calcium: 9.9 mg/dL (ref 8.9–10.3)
Chloride: 105 mmol/L (ref 98–111)
Creatinine, Ser: 4.78 mg/dL — ABNORMAL HIGH (ref 0.61–1.24)
GFR calc Af Amer: 14 mL/min — ABNORMAL LOW (ref >60)
GFR calc non Af Amer: 12 mL/min — ABNORMAL LOW (ref >60)
Glucose, Bld: 84 mg/dL (ref 70–99)
Phosphorus: 4.8 mg/dL — ABNORMAL HIGH (ref 2.5–4.6)
Potassium: 5.2 mmol/L — ABNORMAL HIGH (ref 3.5–5.1)
Sodium: 138 mmol/L (ref 135–145)

## 2018-06-22 MED ORDER — TORSEMIDE 20 MG PO TABS
40.0000 mg | ORAL_TABLET | Freq: Every day | ORAL | Status: DC
  Administered 2018-06-22: 40 mg via ORAL
  Filled 2018-06-22 (×2): qty 2

## 2018-06-22 MED ORDER — SODIUM POLYSTYRENE SULFONATE 15 GM/60ML PO SUSP
45.0000 g | Freq: Once | ORAL | Status: AC
  Administered 2018-06-22: 45 g via ORAL
  Filled 2018-06-22: qty 180

## 2018-06-22 NOTE — Evaluation (Signed)
Physical Therapy Evaluation Patient Details Name: [name] MRN: [id number] DOB: 12-15-1960 Today's Date: 06/22/2018   History of Present Illness  [name] is a 58 y.o. male with medical history significant of back pain since December after a fall, ckd, has been seen by a spine surgeon and told it looks like he may have cancer in his bones.  Pt does nothave a pcp and has had no f/u on this per mri done last month.  He has been taking nsaids otc and tramadol.  Denies n/v/d.  No fevers.  No cough or sob.  No wt loss.  Eating alright but not normally.  No urinary symptoms.  Pt referred for admission for aki, hi ca level, back pain and lactic acidosis.    Clinical Impression  Patient found awake and alert sitting at EOB. Patient demonstrated ability to perform sit to stand with Mod I using straight cane and increased time. Patient ambulated 50 feet with Min Guard although patient demonstrated good balance and ability to ambulate, he reached for the handrail intermittently which he reported was due to back pain, but it did not appear that he was off-balance or unsafe in any way. He requested returning to bed due to fatigue patient performed sitting to supine with modified independence. Patient would continue to benefit from skilled physical therapy in order to continue to progress patient's strength and overall functional mobility.     Follow Up Recommendations Home health PT;Supervision - Intermittent    Equipment Recommendations  None recommended by PT;Other (comment)(Patient may benefit from RW, but patient reported having one at home)    Recommendations for Other Services       Precautions / Restrictions Precautions Precautions: Fall Restrictions Weight Bearing Restrictions: No      Mobility  Bed Mobility Overal bed mobility: Modified Independent             General bed mobility comments: Patient required increased time to perform bed mobility and patient used upper  extremities to lift his right lower extremity into the bed  Transfers Overall transfer level: Modified independent Equipment used: Straight cane             General transfer comment: Patient performed sit to stand from EOB using Straight cane and increased time  Ambulation/Gait Ambulation/Gait assistance: Min guard Gait Distance (Feet): 50 Feet Assistive device: Straight cane Gait Pattern/deviations: Decreased stride length;Trunk flexed Gait velocity: Decreased gait velocity   General Gait Details: Patient demonstrated some forward flexed trunk with ambulation, patient demonstrated good balance with ambulation with cane, however intermittently would reach for the handrail with patient reporting the reason was due to back pain, although it did not appear that patient was unbalanced or unsafe  when reaching for the handrail  Stairs            Wheelchair Mobility    Modified Rankin (Stroke Patients Only)       Balance Overall balance assessment: Modified Independent                                           Pertinent Vitals/Pain Pain Assessment: 0-10 Pain Score: 7  Pain Location: Neck Pain Descriptors / Indicators: Sharp Pain Intervention(s): Limited activity within patient's tolerance;Monitored during session    Home Living Family/patient expects to be discharged to:: Private residence Living Arrangements: Alone Available Help at Discharge: Other (Comment)(Unknown availability of assistance at  home) Type of Home: Mobile home Home Access: Stairs to enter Entrance Stairs-Rails: Can reach both Entrance Stairs-Number of Steps: 2 Home Layout: One level Home Equipment: Cane - single point;Shower seat      Prior Function Level of Independence: Independent with assistive device(s)         Comments: Reported using SPC at home to walk short distances     Hand Dominance        Extremity/Trunk Assessment   Upper Extremity  Assessment Upper Extremity Assessment: Generalized weakness    Lower Extremity Assessment Lower Extremity Assessment: Generalized weakness    Cervical / Trunk Assessment Cervical / Trunk Assessment: Kyphotic  Communication   Communication: No difficulties  Cognition Arousal/Alertness: Awake/alert Behavior During Therapy: Agitated;WFL for tasks assessed/performed Overall Cognitive Status: No family/caregiver present to determine baseline cognitive functioning                                 General Comments: Patient able to report name and DOB accurately      General Comments General comments (skin integrity, edema, etc.): Noted bilateral foot edema    Exercises     Assessment/Plan    PT Assessment Patient needs continued PT services  PT Problem List Decreased strength;Decreased activity tolerance;Decreased balance;Pain       PT Treatment Interventions DME instruction;Gait training;Functional mobility training;Stair training;Therapeutic activities;Therapeutic exercise;Balance training;Patient/family education    PT Goals (Current goals can be found in the Care Plan section)  Acute Rehab PT Goals Patient Stated Goal: To return home PT Goal Formulation: With patient Time For Goal Achievement: 06/29/18 Potential to Achieve Goals: Good    Frequency Min 3X/week   Barriers to discharge        Co-evaluation               AM-PAC PT "6 Clicks" Mobility  Outcome Measure Help needed turning from your back to your side while in a flat bed without using bedrails?: None Help needed moving from lying on your back to sitting on the side of a flat bed without using bedrails?: A Little Help needed moving to and from a bed to a chair (including a wheelchair)?: A Little Help needed standing up from a chair using your arms (e.g., wheelchair or bedside chair)?: A Little Help needed to walk in hospital room?: A Little Help needed climbing 3-5 steps with a railing?  : A Little 6 Click Score: 19    End of Session Equipment Utilized During Treatment: Gait belt Activity Tolerance: Patient tolerated treatment well;No increased pain;Patient limited by fatigue Patient left: in bed;with call bell/phone within reach;with bed alarm set Nurse Communication: Mobility status PT Visit Diagnosis: Unsteadiness on feet (R26.81);Other abnormalities of gait and mobility (R26.89);Muscle weakness (generalized) (M62.81)    Time: 2992-4268 PT Time Calculation (min) (ACUTE ONLY): 21 min   Charges:   PT Evaluation $PT Eval Low Complexity: 1 Low        Clarene Critchley PT, DPT 11:11 AM, 06/22/18 [phone]

## 2018-06-22 NOTE — Plan of Care (Signed)
  Problem: Acute Rehab PT Goals(only PT should resolve) Goal: Patient Will Perform Sitting Balance Outcome: Progressing Flowsheets (Taken 06/22/2018 1113) Patient will perform sitting balance: Independently Goal: Patient Will Transfer Sit To/From Stand Outcome: Progressing Flowsheets (Taken 06/22/2018 1113) Patient will transfer sit to/from stand: Independently Goal: Pt Will Transfer Bed To Chair/Chair To Bed Outcome: Progressing Flowsheets (Taken 06/22/2018 1113) Pt will Transfer Bed to Chair/Chair to Bed: with modified independence Goal: Pt Will Ambulate Outcome: Progressing Flowsheets (Taken 06/22/2018 1113) Pt will Ambulate: 100 feet; with modified independence    Clarene Critchley PT, DPT 11:14 AM, 06/22/18 [phone]

## 2018-06-22 NOTE — Progress Notes (Signed)
Patient ID: [name], male   DOB: 06-21-1960, 58 y.o.   MRN: [id number] [facility] Progress Note   Assessment/ Plan:   1. Acute kidney Injury: Most likely myeloma associated renal injury +/- acute injury from hypercalcemia.  Renal function / GFR stable but low.  Calcium is corrected and has started on MM therapy.  Has sig LEE, off IVFs and NaHCO3.  Trial torsemide 32m PO daily and if good UOP and stable labs ok for DC tomorrow with close f/u.  2.  Hypercalcemia: Calcium level normalized status post calcitonin and prolia.   3.  Kappa light chain multiple myeloma: Status post cyclophosphamide, bortezomib and dexamethasone with next treatment due again this week.  Bone marrow biopsy Bx 87% plasma cells, Kappa restricted;  4.  Hyponatremia: resolved 5.  Hyperphosphatemia: improved off binders 6.  Anemia: Secondary to multiple myeloma, with low iron saturation and elevated ferritin noted.  ESA/iron therapy per hematology.    Subjective:    No c/o  Sig LEE  0.5L UOP  Stable SCr, K and HCO3  Off IVFs   Objective:   BP 129/79 (BP Location: Right Arm)   Pulse (!) 105   Temp 98.3 F (36.8 C) (Oral)   Resp 16   Ht 6' (1.829 m)   Wt 81.5 kg   SpO2 94%   BMI 24.37 kg/m   Intake/Output Summary (Last 24 hours) at 06/22/2018 0950 Last data filed at 06/22/2018 00737Gross per 24 hour  Intake 1560 ml  Output 550 ml  Net 1010 ml   Weight change:   Physical Exam: Gen: sitting at edge of bed PSYCH: down/depressed mood/affect CVS: Pulse regular tachycardia, S1 and S2 normal, no murmur/rub Resp: Clear to auscultation bilaterally, no rales or rhonchi Abd: Soft, flat, nontender Ext: 3+ lower extremity edema tapering off distal to knees  Imaging: No results found.  Labs: BMET Recent Labs  Lab 06/16/18 0503 06/17/18 0424 06/18/18 [id number]/21/20 0606 06/20/18 0603 06/21/18 0544 06/22/18 0432  NA 134* 135  134* 134* 134* 137 136 138  K 4.3 4.1  4.1 4.0 3.8  4.2 4.5 5.2*  CL 99 99  97* 97* 99 102 105 105  CO2 21* _0 21* 22 19* 21*  GLUCOSE 98 84  85 132* 86 80 89 84  BUN 57* 58*  58* 68* 76* 76* 76* 76*  CREATININE 4.50* 4.30*  4.33* 4.46* 4.68* 4.78* 4.80* 4.78*  CALCIUM 12.0* 11.6*  11.6* 10.7* 9.8 9.6 9.6 9.9  PHOS 6.2* 6.3* 6.4* 4.9* 5.1* 4.2 4.8*   CBC Recent Labs  Lab 06/16/18 0503 06/17/18 0424 06/20/18 0612  WBC 11.0* 9.6 11.7*  NEUTROABS 4.3 3.7  --   HGB 9.4* 9.4* 8.9*  HCT 28.7* 27.7* 27.9*  MCV 101.1* 100.4* 105.7*  PLT 158 155 164   Medications:    . bisacodyl  10 mg Rectal Once  . feeding supplement (ENSURE ENLIVE)  237 mL Oral BID BM  . folic acid  1 mg Oral Daily  . lidocaine  1 patch Transdermal Q24H  . polyethylene glycol  17 g Oral Daily  . senna-docusate  2 tablet Oral BID   RRexene Agent 06/22/2018, 9:50 AM

## 2018-06-22 NOTE — Consult Note (Signed)
[facility] Oncology Progress Note  Name: [name]      MRN: [id number]    Location: H417/E081-44  Date: 06/22/2018 Time:4:33 PM   Subjective: Interval History:[name] is seen in follow-up today.  He is sitting in the chair.  Complains of his back hurting.  He also reports having hiccups for the last few hours.  Denies any nausea or vomiting.  Is eating well.  Denies any tingling or numbness in extremities.  Objective: Vital signs in last 24 hours: Temp:  [98.3 F (36.8 C)-98.8 F (37.1 C)] 98.8 F (37.1 C) (03/24 1410) Pulse Rate:  [99-111] 99 (03/24 1410) BP: (128-142)/(69-79) 128/69 (03/24 1410) SpO2:  [91 %-98 %] 98 % (03/24 1410) Weight:  [179 lb 10.8 oz (81.5 kg)] 179 lb 10.8 oz (81.5 kg) (03/24 0747)    Intake/Output from previous day: 03/23 0800 - 03/24 0759 In: 1500 [P.O.:1500] Out: 550 [Urine:550]    Intake/Output this shift: Total I/O In: 600 [P.O.:600] Out: -    PHYSICAL EXAM: BP 128/69 (BP Location: Right Arm)   Pulse 99   Temp 98.8 F (37.1 C) (Oral)   Resp 16   Ht 6' (1.829 m)   Wt 179 lb 10.8 oz (81.5 kg)   SpO2 98%   BMI 24.37 kg/m  General appearance: alert and cooperative Lungs: Decreased breath sounds at bases.  Otherwise clear lungs. Heart: regular rate and rhythm Abdomen: soft, non-tender; bowel sounds normal; no masses,  no organomegaly Extremities: 1-2+ edema bilaterally.  No cyanosis. Skin: Skin color, texture, turgor normal. No rashes or lesions Lymph nodes: Cervical, supraclavicular, and axillary nodes normal. Neurologic: Grossly normal   Studies/Results: Results for orders placed or performed during the hospital encounter of 06/12/18 (from the past 48 hour(s))  Renal function panel     Status: Abnormal   Collection Time: 06/21/18  5:44 AM  Result Value Ref Range   Sodium 136 135 - 145 mmol/L   Potassium 4.5 3.5 - 5.1 mmol/L   Chloride 105 98 - 111 mmol/L   CO2 19 (L) 22 - 32 mmol/L   Glucose, Bld 89 70 - 99  mg/dL   BUN 76 (H) 6 - 20 mg/dL   Creatinine, Ser 4.80 (H) 0.61 - 1.24 mg/dL   Calcium 9.6 8.9 - 10.3 mg/dL   Phosphorus 4.2 2.5 - 4.6 mg/dL   Albumin 3.6 3.5 - 5.0 g/dL   GFR calc non Af Amer 12 (L) >60 mL/min   GFR calc Af Amer 14 (L) >60 mL/min   Anion gap 12 5 - 15    Comment: Performed at [facility], [address] [place], [place] [address]  Renal function panel     Status: Abnormal   Collection Time: 06/22/18  4:32 AM  Result Value Ref Range   Sodium 138 135 - 145 mmol/L   Potassium 5.2 (H) 3.5 - 5.1 mmol/L   Chloride 105 98 - 111 mmol/L   CO2 21 (L) 22 - 32 mmol/L   Glucose, Bld 84 70 - 99 mg/dL   BUN 76 (H) 6 - 20 mg/dL   Creatinine, Ser 4.78 (H) 0.61 - 1.24 mg/dL   Calcium 9.9 8.9 - 10.3 mg/dL   Phosphorus 4.8 (H) 2.5 - 4.6 mg/dL   Albumin 3.6 3.5 - 5.0 g/dL   GFR calc non Af Amer 12 (L) >60 mL/min   GFR calc Af Amer 14 (L) >60 mL/min   Anion gap 12 5 - 15    Comment: Performed at  [facility], [address], [place], [place] [address]   No results found.   MEDICATIONS: I have reviewed the patient's current medications.     Assessment/Plan:  1.  Kappa light chain myeloma: - Bone marrow biopsy on 06/16/2018 shows 87% plasma cells. - Received cycle 1 day 1 of cyclophosphamide, bortezomib and dexamethasone on 06/17/2018.  We are awaiting results of the Edna panel. -Skeletal survey did not show any lytic lesions.  Kappa free light chains are elevated at 12,085.  Free light chain ratio of 1981. - SPEP shows 0.4 g of monoclonal protein.  Serum immunofixation does not show any monoclonal heavy chain competent. - I will plan to give his day 7 of treatment tomorrow. -Upon discharge, he can follow-up with me in the office to continue his treatments.  2.  Hypercalcemia: -He presented with hypercalcemia of 13.1. -He received 1 dose of Xgeva along with calcitonin twice daily.  He also received IV hydration with Lasix. -Calcium today has improved to 9.9.  3.  Acute kidney  injury: - Most likely from myeloma associated renal injury.  Creatinine today is 4.78.  He is nonoliguric.  4.  Macrocytic anemia: -Last hemoglobin was 8.9 with MCV of 105. - This is from combination of myeloma, renal insufficiency. -If hemoglobin drops below 8, will consider erythropoiesis stimulating agents.  All questions were answered. The patient knows to call the clinic with any problems, questions or concerns. We can certainly see the patient much sooner if necessary.    Derek Jack

## 2018-06-22 NOTE — Progress Notes (Addendum)
Patient Demographics:    [name], is a 58 y.o. male, DOB - Nov 25, 1960, PIR:[id number]  Admit date - 06/12/2018   Admitting Physician Phillips Grout, MD  Outpatient Primary MD for the patient is Patient, No Pcp Per  LOS - 10   Chief Complaint  Patient presents with  . Abdominal Pain        Subjective:    [name] today has no fevers, no emesis, complains of chronic back pain, 3+ lower extremity edema tapering off distal to knees... Patient is due for chemo on 06/23/2018  Assessment  & Plan :    Principal Problem:   AKI (acute kidney injury) ([place]) Active Problems:   Tobacco abuse   Substance abuse ([place])   Personality disorder ([place])   Panlobular emphysema ([place])   Chronic left-sided low back pain with left-sided sciatica   Left lumbar radiculopathy   PNA (pneumonia)   Hypercalcemia   Macrocytic anemia   Lumbar epidural mass ([facility])   Kappa light chain myeloma ([facility])   Brief History: 58 year old male with a history of COPD, substance abuse, spinal stenosis and lumbar radiculopathy status post fusion 07/24/2015([place])presenting with abdominal pain radiating to the bilateral flanks as well as nausea without any emesis or diarrhea. He denies any fevers, chills, chest pain, shortness of breath, coughing. Patient had MRI of the lumbar spine on 05/06/2018 which showed abnormal right prevertebral and epidural soft tissue at L2-3 concerning for tumor infection. There is also right L3 nerve impingement. There is moderate L4-5 canal stenosis with moderate to severe L2-3 narrowing. The patient subsequently had L2 nerve block performed on 05/27/2018. Admitted on 06/12/2018 with back pain and hypercalcemia with corrected calcium of 13.1 and acute AKI on CKD--in the setting of NSAID use and multiple myeloma-- Renal and heme/onc were consulted to assist with management. Patient is due for chemo on  06/23/2018   Assessment/Plan: 1)AKI  -Etiology includes with myeloma Vs NSAIDs Vs hypercalcemia--or combination of all 3 -Renal ultrasound--neg hydronephrosis -SPEP--positive M-spike in gamma region -Serum Immunofixation--pending -serum kappa/lambda light chains--elevated -urine immunofixation = kappa Bence Jones protein, kappa type. SPEP shows 0.4g of monoclonal protein -baseline creatinine ~1.0 -urine protein/creatinine ratio--5.29 -appreciate nephrology input--nephrologist okay to STOP IV fluids on 06/21/18, Stopped IV Lasix on 06/18/18 Creatinine is 4.78 (peak was 6.14) 3+ lower extremity edema tapering off distal to knees--- per nephrologist start oral torsemide 40 mg   2)Kappa light chain multiple myeloma: Kappa free light chains elevated to 12,085, lambda light chain 6.1, free light chain ratio of 1981- Bone marrow biopsy on 06/16/18 with 87% plasma cells, Kappa restricted;  Skeletal bone survey without lytic lesion  status post chemo with cyclophosphamide, bortezomib and dexamethasone on 06/17/18,  with next treatment due this week.  Bone marrow biopsy result/myeloma FISH panel pending to help guide additional management.   Patient is due for chemo on 06/23/2018   3)Hypercalcemia -Intact PTH--9 On admission corrected calcium was 13.1 -likely due to myeloma -Status post Xgeva along with calcitonin bid ---  Lasix discontinued by his nephrologist as of 06/18/2018, IVF continued 06/21/2018 Calcium is down to 9.9 (corrected calcium is still around 10) -SPEP--positive M-spike in gamma region/SPEP shows 0.4g of monoclonal protein -serum kappa/lambda light chains--pending -urine immunofixation = kappa Bence-Jones  protein -heme/onc consult-->Katragadda spoke with IR after I spoke with Dr. Vernard Gambles (IR)-->only wants BM biopsy, cancelled L2-3 biopsy -completed 4 doses calcitonin [place]  3)Lumbar radiculopathy -05/06/2018 MRI L-spine--diffusely abnormal bone marrow signal concerning for  myeloproliferative disorder versus infiltrative metastatic disease. Abnormal prevertebraltissue as discussed above at L2-3 -3/16&3/17--discussed with neurosurgery, Dr. Melynda Keller nonoperative managementfor radiculopathy -06/15/18--discussed with IR (Dr. Gaye Pollack for needle bx of L2-3 soft tissue-->placed order in Epic -Dr. Delton Coombes spoke with IR after I spoke with Dr. Vernard Gambles (IR)-->only wants BM biopsy, cancelled L2-3 biopsy -3/16 MR T-spine--no acute findings 3/16 MR L-spine--motion degraded with persistent paracentral soft tissue -06/15/18--ambulated pt--pt able to ambulate with minimal to no assist Pain control improving on Percocets,  4)Bilateral pleural effusions -Echocardiogram--probably normal EF, poor quality -Urine protein creatinine ratio--5.29 -Stable on room air  5)Proteinuria -pt has nephrotic range proteinuria -has contributed to pleural effusions -urine protein/creatinine ratio 5.29 -nephrology following--appreciated  6)COPD -Stable on room air -Patient states that he quit smoking 1 month PTA  7)Pulmonary opacity -CT abdomen and pelvis showed consolidation in the posterior left lower lobe -Patient has no fever or shortness of breath -Viral respiratory panel shows rhinovirus -procalcitonin--7.71 -CT chest--no consolidation, moderate bilateral pleural effusion with atelectasis. Multiple tiny bilateral nodules. -d/c ceftriaxone and azithromycin-->remains afebrile and hemodynamically stable  8)Abdominal Pain May be related to chronic constipation, patient with chronic pain issues -3/14 CT abd--mid sigmoid wall thickening withoutsurrounding inflammation--?muscular hypertrophy  9)Macrocytic anemia--likely due to multiple myeloma--  Per hematology okay to consider ESA if Hgb < 8 will defer to hematology and nephrology -I20--355 -folic HRCB--6.3 -iron saturation 35%, ferritin 62 -transfused2 units 06/14/18 Hgb is down to 8.9 -started  folate supplementation  10)Hyperkalemia----in the setting of constipation, give Kayexalate x1 dose, repeat renal panel in a.m.  Disposition Plan: PT recommends home health PT family Communication:daughter updated at bedside   Consultants:Renal/heme/onc and IR  Code Status: FULL   DVT Prophylaxis: [place] Heparin   Disposition--- possible discharge home in 1 to 2 days if renal function remains stable without IV fluids as per nephrology-- Dr. Joelyn Oms from Nephrology advised against discharge on 06/22/2018-instead started the patient on torsemide due to increasing edema  Procedures: As Listed in Progress Note Above  Antibiotics: Ceftriaxone 3/14>>>3/16 azithrommycin 3/14>>>3/16    Code Status : Full   Family Communication:   Daughter at bedside  DVT Prophylaxis  :   SCDs  Lab Results  Component Value Date   PLT 164 06/20/2018    Inpatient Medications  Scheduled Meds: . bisacodyl  10 mg Rectal Once  . feeding supplement (ENSURE ENLIVE)  237 mL Oral BID BM  . folic acid  1 mg Oral Daily  . lidocaine  1 patch Transdermal Q24H  . polyethylene glycol  17 g Oral Daily  . senna-docusate  2 tablet Oral BID  . torsemide  40 mg Oral Daily   Continuous Infusions:  PRN Meds:.bisacodyl, hydrocortisone cream, loratadine, oxyCODONE-acetaminophen    Anti-infectives (From admission, onward)   Start     Dose/Rate Route Frequency Ordered Stop   06/12/18 2100  azithromycin (ZITHROMAX) 500 mg in sodium chloride 0.9 % 250 mL IVPB  Status:  Discontinued     500 mg 250 mL/hr over 60 Minutes Intravenous Every 24 hours 06/12/18 2011 06/15/18 0753   06/12/18 2100  cefTRIAXone (ROCEPHIN) 1 g in sodium chloride 0.9 % 100 mL IVPB  Status:  Discontinued     1 g 200 mL/hr over 30 Minutes Intravenous Every 24 hours 06/12/18 2054 06/12/18 2056  06/12/18 2100  azithromycin (ZITHROMAX) 500 mg in sodium chloride 0.9 % 250 mL IVPB  Status:  Discontinued     500 mg 250 mL/hr over 60  Minutes Intravenous Every 24 hours 06/12/18 2054 06/12/18 2056   06/12/18 2030  cefTRIAXone (ROCEPHIN) 1 g in sodium chloride 0.9 % 100 mL IVPB  Status:  Discontinued     1 g 200 mL/hr over 30 Minutes Intravenous Every 24 hours 06/12/18 2011 06/15/18 0753        Objective:   Vitals:   06/22/18 0517 06/22/18 0747 06/22/18 0801 06/22/18 1410  BP: 129/79   128/69  Pulse: (!) 105   99  Resp:      Temp: 98.3 F (36.8 C)   98.8 F (37.1 C)  TempSrc: Oral   Oral  SpO2: 95%  94% 98%  Weight:  81.5 kg    Height:        Wt Readings from Last 3 Encounters:  06/22/18 81.5 kg  05/29/18 79.4 kg  04/15/18 93 kg     Intake/Output Summary (Last 24 hours) at 06/22/2018 1752 Last data filed at 06/22/2018 1300 Gross per 24 hour  Intake 1320 ml  Output 200 ml  Net 1120 ml     Physical Exam Patient is examined daily including today on 06/22/18 , exams remain the same as of yesterday except that has changed   Gen:- Awake Alert,  In no apparent distress  HEENT:- [place].AT, No sclera icterus Neck-Supple Neck,No JVD,.  Lungs-  CTAB , fair symmetrical air movement CV- S1, S2 normal, regular  Abd-  +ve B.Sounds, Abd Soft, No tenderness,    Extremity/Skin:- Bil LE with 3 + pitting edema, pedal pulses present  Psych-affect is appropriate, oriented x3 Neuro-generalized weakness without  new focal deficits, no tremors   Data Review:   Micro Results Recent Results (from the past 240 hour(s))  Culture, blood (routine x 2) Call MD if unable to obtain prior to antibiotics being given     Status: None   Collection Time: 06/12/18 10:41 PM  Result Value Ref Range Status   Specimen Description BLOOD RIGHT HAND  Final   Special Requests   Final    BOTTLES DRAWN AEROBIC AND ANAEROBIC Blood Culture adequate volume   Culture   Final    NO GROWTH 5 DAYS Performed at [facility], [address], [place], [place] [address]    Report Status 06/17/2018 FINAL  Final  Respiratory Panel by PCR     Status:  Abnormal   Collection Time: 06/12/18 10:44 PM  Result Value Ref Range Status   Adenovirus NOT DETECTED NOT DETECTED Final   Coronavirus 229E NOT DETECTED NOT DETECTED Final    Comment: (NOTE) The Coronavirus on the Respiratory Panel, DOES NOT test for the novel  Coronavirus (2019 nCoV)    Coronavirus HKU1 NOT DETECTED NOT DETECTED Final   Coronavirus NL63 NOT DETECTED NOT DETECTED Final   Coronavirus OC43 NOT DETECTED NOT DETECTED Final   Metapneumovirus NOT DETECTED NOT DETECTED Final   Rhinovirus / Enterovirus DETECTED (A) NOT DETECTED Final   Influenza A NOT DETECTED NOT DETECTED Final   Influenza B NOT DETECTED NOT DETECTED Final   Parainfluenza Virus 1 NOT DETECTED NOT DETECTED Final   Parainfluenza Virus 2 NOT DETECTED NOT DETECTED Final   Parainfluenza Virus 3 NOT DETECTED NOT DETECTED Final   Parainfluenza Virus 4 NOT DETECTED NOT DETECTED Final   Respiratory Syncytial Virus NOT DETECTED NOT DETECTED Final   Bordetella  pertussis NOT DETECTED NOT DETECTED Final   Chlamydophila pneumoniae NOT DETECTED NOT DETECTED Final   Mycoplasma pneumoniae NOT DETECTED NOT DETECTED Final    Comment: Performed at [facility], [place] [address] [place], [place] [address]  Culture, blood (routine x 2) Call MD if unable to obtain prior to antibiotics being given     Status: None   Collection Time: 06/12/18 10:58 PM  Result Value Ref Range Status   Specimen Description BLOOD LEFT ARM  Final   Special Requests   Final    BOTTLES DRAWN AEROBIC AND ANAEROBIC Blood Culture adequate volume   Culture   Final    NO GROWTH 5 DAYS Performed at [facility], [address] [place], [place] [address]    Report Status 06/17/2018 FINAL  Final  MRSA PCR Screening     Status: None   Collection Time: 06/13/18 12:20 AM  Result Value Ref Range Status   MRSA by PCR NEGATIVE NEGATIVE Final    Comment:        The GeneXpert MRSA Assay (FDA approved for NASAL specimens only), is one component of  a comprehensive MRSA colonization surveillance program. It is not intended to diagnose MRSA infection nor to guide or monitor treatment for MRSA infections. Performed at [facility], [address], [place], [place] [address]     Radiology Reports Ct Abdomen Pelvis Wo Contrast  Result Date: 06/12/2018 CLINICAL DATA:  Abdominal pain and fever EXAM: CT ABDOMEN AND PELVIS WITHOUT CONTRAST TECHNIQUE: Multidetector CT imaging of the abdomen and pelvis was performed following the standard protocol without IV contrast. Oral contrast was administered. COMPARISON:  Aug 12, 2012 FINDINGS: Lower chest: There are free-flowing pleural effusions bilaterally with bibasilar atelectasis. There is questionable mild consolidation in the posterior left lung base as well. Hepatobiliary: No focal liver lesions are appreciable on this noncontrast enhanced study. The gallbladder wall is not appreciably thickened. There is no biliary duct dilatation. Pancreas: No pancreatic mass or inflammatory focus. Spleen: No splenic lesions are evident. Adrenals/Urinary Tract: Adrenals bilaterally appear unremarkable. Kidneys bilaterally show no evident mass or hydronephrosis on either side. There is no appreciable renal or ureteral calculus on either side. Urinary bladder is midline with wall thickness within normal limits. Stomach/Bowel: There are sigmoid diverticula without overt diverticulitis. There is wall thickening in portions of the mid sigmoid colon without surrounding inflammation. Suspect muscular hypertrophy in this area from chronic diverticulosis. No bowel wall thickening is noted elsewhere. No bowel obstruction appreciable. No free air or portal venous air. Vascular/Lymphatic: There is aortic and bilateral iliac artery atherosclerosis. No aneurysm evident. There is no appreciable adenopathy in the abdomen or pelvis. Reproductive: Prostate and seminal vesicles appear normal in size and contour. There is no appreciable pelvic  mass. Other: Appendix appears normal. No abscess or ascites is evident in the abdomen or pelvis. Musculoskeletal: There is extensive postoperative change from L1-L4. There is arthropathy throughout the lumbar region. There are no blastic or lytic bone lesions. There is no intramuscular or abdominal wall lesion evident. IMPRESSION: 1. Moderate free-flowing pleural effusions bilaterally with bibasilar atelectasis. Question a degree of superimposed pneumonia in the posterior left base. 2. Wall thickening in the mid sigmoid colon, likely due to muscular hypertrophy from chronic diverticulosis. Earliest changes of diverticulitis in this area are difficult to entirely exclude. No overt diverticulitis is evident. 3. No bowel obstruction. Appendix appears normal. No evident abscess in the abdomen or pelvis. 4. No evident renal or ureteral calculus. No evident  hydronephrosis on either side. 5.  Aortoiliac atherosclerosis. 6.  Extensive postoperative change throughout the lumbar spine. Electronically Signed   By: Lowella Grip III M.D.   On: 06/12/2018 17:14   Dg Chest 2 View  Result Date: 06/12/2018 CLINICAL DATA:  Stomach pain for a week. No vomiting or diarrhea. Fever. EXAM: CHEST - 2 VIEW COMPARISON:  CT chest 05/28/2016. Chest 11/04/2010 FINDINGS: Normal heart size and pulmonary vascularity. Small left pleural effusion with infiltration in the left lung base. This suggests pneumonia. Right lung is clear. No pneumothorax. Mediastinal contours appear intact. IMPRESSION: Small left pleural effusion with infiltration in the left lung base suggesting pneumonia. Electronically Signed   By: Lucienne Capers M.D.   On: 06/12/2018 21:18   Ct Chest Wo Contrast  Result Date: 06/14/2018 CLINICAL DATA:  58 year old male with history of chest pain and pleural effusion. EXAM: CT CHEST WITHOUT CONTRAST TECHNIQUE: Multidetector CT imaging of the chest was performed following the standard protocol without IV contrast.  COMPARISON:  Chest CT 05/20/2016.  Chest x-ray 06/12/2018. FINDINGS: Cardiovascular: Heart size is normal. There is no significant pericardial fluid, thickening or pericardial calcification. Aortic atherosclerosis. No coronary artery calcifications. Mediastinum/Nodes: No pathologically enlarged mediastinal or hilar lymph nodes. Esophagus is unremarkable in appearance. No axillary lymphadenopathy. Lungs/Pleura: Multiple tiny 2-3 mm pulmonary nodules scattered throughout both lungs, several of which are calcified, compatible with benign granulomas. No larger more suspicious appearing pulmonary nodules or masses are noted. Moderate-sized bilateral pleural effusions lying dependently. This is associated with some mild passive atelectasis in the lower lobes of the lungs bilaterally. No acute consolidative airspace disease. Diffuse bronchial wall thickening with mild centrilobular and paraseptal emphysema. Upper Abdomen: Aortic atherosclerosis. Musculoskeletal: There are no aggressive appearing lytic or blastic lesions noted in the visualized portions of the skeleton. IMPRESSION: 1. Moderate bilateral pleural effusions lying dependently with areas of passive subsegmental atelectasis in the lower lobes of the lungs bilaterally. 2. Multiple tiny 2-3 mm pulmonary nodules scattered throughout the lungs bilaterally, nonspecific, but similar to prior study from 05/28/2016, considered definitively benign. 3. Aortic atherosclerosis. 4. Diffuse bronchial wall thickening with mild centrilobular and paraseptal emphysema; imaging findings suggestive of underlying COPD. Aortic Atherosclerosis (ICD10-I70.0) and Emphysema (ICD10-J43.9). Electronically Signed   By: Vinnie Langton M.D.   On: 06/14/2018 10:53   Mr Thoracic Spine Wo Contrast  Result Date: 06/14/2018 CLINICAL DATA:  Mid low back pain for 2 months EXAM: MRI THORACIC AND LUMBAR SPINE WITHOUT CONTRAST TECHNIQUE: Multiplanar and multiecho pulse sequences of the thoracic  and lumbar spine were obtained without intravenous contrast. COMPARISON:  Lumbar spine MRI 05/06/2018 FINDINGS: Severe patient motion degrading image quality limiting evaluation. Portion of the examination are nondiagnostic. MRI THORACIC SPINE FINDINGS Alignment:  Physiologic. Vertebrae: No fracture, evidence of discitis, or bone lesion. Cord:  Normal signal and morphology. Paraspinal and other soft tissues: No acute paraspinal abnormality. Disc levels: Disc spaces:  Disc spaces are relatively preserved. C6-7: Mild broad-based disc bulge. No foraminal or central canal stenosis. T1-T2: No disc protrusion, foraminal stenosis or central canal stenosis. T2-T3: No disc protrusion, foraminal stenosis or central canal stenosis. T3-T4: Broad-based disc bulge. Moderate bilateral facet arthropathy. Mild spinal stenosis. T4-T5: No disc protrusion, foraminal stenosis or central canal stenosis. Bilateral mild facet arthropathy. T5-T6: No disc protrusion, foraminal stenosis or central canal stenosis. T6-T7: No disc protrusion, foraminal stenosis or central canal stenosis. T7-T8: No disc protrusion, foraminal stenosis or central canal stenosis. T8-T9: No disc protrusion, foraminal stenosis or central canal stenosis. T9-T10:  No disc protrusion, foraminal stenosis or central canal stenosis. T10-T11: No disc protrusion, foraminal stenosis or central canal stenosis. T11-T12: No disc protrusion, foraminal stenosis or central canal stenosis. MRI LUMBAR SPINE FINDINGS Segmentation:  Standard. Alignment:  Physiologic. Vertebrae: No fracture, evidence of discitis, or aggressive bone lesion. Low marrow signal throughout the lumbar spine which is limited in evaluation secondary to patient motion. Conus medullaris and cauda equina: Conus extends to the T12 level. Conus and cauda equina appear normal. Paraspinal and other soft tissues: No acute paraspinal abnormality. Disc levels: Disc spaces: Posterior lumbar interbody fusion from L1 through  L4. T12-L1: Minimal broad-based disc bulge. Mild bilateral facet arthropathy. No evidence of neural foraminal stenosis. No central canal stenosis. L1-L2: Interbody fusion. No evidence of neural foraminal stenosis. No central canal stenosis. L2-L3: Interbody fusion. Evaluation is extremely limited secondary to patient motion degrading image quality. There is suggestion of persistent right central/right paracentral epidural soft tissue, but characterization is extremely difficult for given the degree of patient motion. Right foraminal stenosis. No central canal stenosis. L3-L4: Interbody fusion. No evidence of neural foraminal stenosis. No central canal stenosis. L4-L5: Minimal broad-based disc bulge. Mild bilateral facet scratch them moderate bilateral facet arthropathy. Mild left foraminal stenosis. No right foraminal stenosis. No central canal stenosis. L5-S1: Broad-based disc bulge. Moderate bilateral facet arthropathy. Mild left foraminal stenosis. No significant right foraminal stenosis. No central canal stenosis. IMPRESSION: Severe patient motion degrading image quality limiting evaluation. Portion of the examination are nondiagnostic. MR THORACIC SPINE IMPRESSION 1. No aggressive osseous lesion to suggest malignancy. 2.  No acute osseous injury of the thoracic spine. MR LUMBAR SPINE IMPRESSION 1. L2-3 is extremely limited in evaluation secondary to patient motion degrading image quality. There is suggestion of persistent right central/right paracentral epidural soft tissue, but characterization is extremely difficult for given the degree of patient motion. Right foraminal stenosis. Recommend repeat MRI of the lumbar spine when the patient is able to better tolerate the examination with less patient motion. 2.  No acute osseous injury of the lumbar spine. Electronically Signed   By: Kathreen Devoid   On: 06/14/2018 19:51   Mr Lumbar Spine Wo Contrast  Result Date: 06/14/2018 CLINICAL DATA:  Mid low back pain  for 2 months EXAM: MRI THORACIC AND LUMBAR SPINE WITHOUT CONTRAST TECHNIQUE: Multiplanar and multiecho pulse sequences of the thoracic and lumbar spine were obtained without intravenous contrast. COMPARISON:  Lumbar spine MRI 05/06/2018 FINDINGS: Severe patient motion degrading image quality limiting evaluation. Portion of the examination are nondiagnostic. MRI THORACIC SPINE FINDINGS Alignment:  Physiologic. Vertebrae: No fracture, evidence of discitis, or bone lesion. Cord:  Normal signal and morphology. Paraspinal and other soft tissues: No acute paraspinal abnormality. Disc levels: Disc spaces:  Disc spaces are relatively preserved. C6-7: Mild broad-based disc bulge. No foraminal or central canal stenosis. T1-T2: No disc protrusion, foraminal stenosis or central canal stenosis. T2-T3: No disc protrusion, foraminal stenosis or central canal stenosis. T3-T4: Broad-based disc bulge. Moderate bilateral facet arthropathy. Mild spinal stenosis. T4-T5: No disc protrusion, foraminal stenosis or central canal stenosis. Bilateral mild facet arthropathy. T5-T6: No disc protrusion, foraminal stenosis or central canal stenosis. T6-T7: No disc protrusion, foraminal stenosis or central canal stenosis. T7-T8: No disc protrusion, foraminal stenosis or central canal stenosis. T8-T9: No disc protrusion, foraminal stenosis or central canal stenosis. T9-T10: No disc protrusion, foraminal stenosis or central canal stenosis. T10-T11: No disc protrusion, foraminal stenosis or central canal stenosis. T11-T12: No disc protrusion, foraminal stenosis or central canal stenosis.  MRI LUMBAR SPINE FINDINGS Segmentation:  Standard. Alignment:  Physiologic. Vertebrae: No fracture, evidence of discitis, or aggressive bone lesion. Low marrow signal throughout the lumbar spine which is limited in evaluation secondary to patient motion. Conus medullaris and cauda equina: Conus extends to the T12 level. Conus and cauda equina appear normal.  Paraspinal and other soft tissues: No acute paraspinal abnormality. Disc levels: Disc spaces: Posterior lumbar interbody fusion from L1 through L4. T12-L1: Minimal broad-based disc bulge. Mild bilateral facet arthropathy. No evidence of neural foraminal stenosis. No central canal stenosis. L1-L2: Interbody fusion. No evidence of neural foraminal stenosis. No central canal stenosis. L2-L3: Interbody fusion. Evaluation is extremely limited secondary to patient motion degrading image quality. There is suggestion of persistent right central/right paracentral epidural soft tissue, but characterization is extremely difficult for given the degree of patient motion. Right foraminal stenosis. No central canal stenosis. L3-L4: Interbody fusion. No evidence of neural foraminal stenosis. No central canal stenosis. L4-L5: Minimal broad-based disc bulge. Mild bilateral facet scratch them moderate bilateral facet arthropathy. Mild left foraminal stenosis. No right foraminal stenosis. No central canal stenosis. L5-S1: Broad-based disc bulge. Moderate bilateral facet arthropathy. Mild left foraminal stenosis. No significant right foraminal stenosis. No central canal stenosis. IMPRESSION: Severe patient motion degrading image quality limiting evaluation. Portion of the examination are nondiagnostic. MR THORACIC SPINE IMPRESSION 1. No aggressive osseous lesion to suggest malignancy. 2.  No acute osseous injury of the thoracic spine. MR LUMBAR SPINE IMPRESSION 1. L2-3 is extremely limited in evaluation secondary to patient motion degrading image quality. There is suggestion of persistent right central/right paracentral epidural soft tissue, but characterization is extremely difficult for given the degree of patient motion. Right foraminal stenosis. Recommend repeat MRI of the lumbar spine when the patient is able to better tolerate the examination with less patient motion. 2.  No acute osseous injury of the lumbar spine. Electronically  Signed   By: Kathreen Devoid   On: 06/14/2018 19:51   US Renal  Result Date: 06/14/2018 CLINICAL DATA:  Acute on chronic renal failure EXAM: RENAL / URINARY TRACT ULTRASOUND COMPLETE COMPARISON:  06/12/2018 FINDINGS: Right Kidney: Renal measurements: 11.6 x 4.9 x 7.6 cm = volume: 224 mL. Diffuse increased echogenicity is noted. No mass or hydronephrosis is noted. Left Kidney: Renal measurements: 11.9 x 6.5 x 5.5 cm = volume: 222 mL. Increased echogenicity is noted. No mass lesion or hydronephrosis is seen. Bladder: Appears normal for degree of bladder distention. Bilateral pleural effusions are noted similar to that seen on prior CT. IMPRESSION: Diffuse increased echogenicity consistent with the given clinical history. Bilateral small effusions. Electronically Signed   By: Inez Catalina M.D.   On: 06/14/2018 10:30   Ct Biopsy  Result Date: 06/16/2018 INDICATION: 58 year old male referred for bone marrow biopsy with history of possible multiple myeloma EXAM: CT BONE MARROW BIOPSY AND ASPIRATION; CT BIOPSY MEDICATIONS: None. ANESTHESIA/SEDATION: Moderate (conscious) sedation was employed during this procedure. A total of Versed 3.0 mg and Fentanyl 100 mcg was administered intravenously. Moderate Sedation Time: 10 minutes. The patient's level of consciousness and vital signs were monitored continuously by radiology nursing throughout the procedure under my direct supervision. FLUOROSCOPY TIME:  CT COMPLICATIONS: None PROCEDURE: The procedure risks, benefits, and alternatives were explained to the patient. Questions regarding the procedure were encouraged and answered. The patient understands and consents to the procedure. Scout CT of the pelvis was performed for surgical planning purposes. The posterior pelvis was prepped with Chlorhexidine in a sterile fashion, and a  sterile drape was applied covering the operative field. A sterile gown and sterile gloves were used for the procedure. Local anesthesia was provided  with 1% Lidocaine. Posterior left iliac bone was targeted for biopsy. The skin and subcutaneous tissues were infiltrated with 1% lidocaine without epinephrine. A small stab incision was made with an 11 blade scalpel, and an 11 gauge Murphy needle was advanced with CT guidance to the posterior cortex. Manual forced was used to advance the needle through the posterior cortex and the stylet was removed. A bone marrow aspirate was retrieved and passed to a cytotechnologist in the room. The initial aspirate was negative for visualized spicules/bone particles. Secondary aspirate was performed with heparin. The Murphy needle was then advanced without the stylet for a core biopsy. The core biopsy was retrieved and also passed to a cytotechnologist. A second core biopsy was achieved given the absence of bone particles. Manual pressure was used for hemostasis and a sterile dressing was placed. No complications were encountered no significant blood loss was encountered. Patient tolerated the procedure well and remained hemodynamically stable throughout. IMPRESSION: Status post CT-guided bone marrow biopsy, with tissue specimen sent to pathology for complete histopathologic analysis Signed, Dulcy Fanny. Earleen Newport, DO [facility] Radiology Electronically Signed   By: Corrie Mckusick D.O.   On: 06/16/2018 12:03   Dg Bone Survey Met  Result Date: 06/15/2018 CLINICAL DATA:  58 year old male with a history of hypercalcemia EXAM: METASTATIC BONE SURVEY COMPARISON:  Chest CT 06/14/2018, plain film 06/12/2018, lumbar CT 05/06/2018 FINDINGS: Skull: No acute fracture. No lytic or sclerotic lesion identified.  Endodontal disease. Cervical spine: Surgical changes of anterior cervical discectomy infusion with anterior plate screw fixation Z0-C5 with no complicating features. Degenerative changes at the levels above and below. No lytic lesions or sclerotic lesions.  Alignment maintained. Thoracic  spine: Vertebral body heights maintained with alignment. Mild disc disease of the thoracic spine. No acute fracture. No lytic or sclerotic lesions. Unremarkable appearance of the pedicles. Lumbar spine: Surgical changes of posterior lumbar interbody fusion with bilateral pedicle screw and rod fixation spanning L1-L4. No complicating features. No acute fracture. The lytic changes that were identified on prior CT are not visualized on the current plain film. Moderate disc disease and facet disease of the lumbar spine. No lytic or sclerotic lesions identified. Chest: Cardiomediastinal silhouette within normal limits. Blunting of the bilateral costophrenic angles with obscuration of the hemidiaphragms bilaterally. No interlobular septal thickening. No rib fracture identified. Pelvis: No acute fracture. Sclerotic changes at the inferior aspect of the left sacroiliac joint on the iliac aspect, similar to prior CT abdomen. No lytic changes. Degenerative changes of the hips. Right upper extremity: No acute fracture.  No lucent lesion or sclerotic focus. Left upper extremity: No acute fracture.  No lucent lesion or sclerotic changes. Right lower extremity: No acute fracture.  No no lucent lesion or sclerotic lesion. Left lower extremity: No acute fracture.  No lucent lesion or sclerotic lesion. IMPRESSION: Abnormal lucent changes identified in the lumbar spine on the prior CT dated 05/06/2018 are not visualized on the current plain film. If concern for malignancy, correlation with either nuclear medicine bone scan or PET-CT may be useful. Sclerotic focus at the inferior aspect of the left sacral iliac joint, present on prior CT pelvis. If concern for malignancy, correlation with either nuclear medicine bone scan or PET-CT may be useful. Bilateral small pleural effusions, present on prior CT. Electronically Signed   By: Corrie Mckusick  D.O.   On: 06/15/2018 09:22   Ct Bone Marrow Biopsy & Aspiration  Result Date:  06/16/2018 INDICATION: 58 year old male referred for bone marrow biopsy with history of possible multiple myeloma EXAM: CT BONE MARROW BIOPSY AND ASPIRATION; CT BIOPSY MEDICATIONS: None. ANESTHESIA/SEDATION: Moderate (conscious) sedation was employed during this procedure. A total of Versed 3.0 mg and Fentanyl 100 mcg was administered intravenously. Moderate Sedation Time: 10 minutes. The patient's level of consciousness and vital signs were monitored continuously by radiology nursing throughout the procedure under my direct supervision. FLUOROSCOPY TIME:  CT COMPLICATIONS: None PROCEDURE: The procedure risks, benefits, and alternatives were explained to the patient. Questions regarding the procedure were encouraged and answered. The patient understands and consents to the procedure. Scout CT of the pelvis was performed for surgical planning purposes. The posterior pelvis was prepped with Chlorhexidine in a sterile fashion, and a sterile drape was applied covering the operative field. A sterile gown and sterile gloves were used for the procedure. Local anesthesia was provided with 1% Lidocaine. Posterior left iliac bone was targeted for biopsy. The skin and subcutaneous tissues were infiltrated with 1% lidocaine without epinephrine. A small stab incision was made with an 11 blade scalpel, and an 11 gauge Murphy needle was advanced with CT guidance to the posterior cortex. Manual forced was used to advance the needle through the posterior cortex and the stylet was removed. A bone marrow aspirate was retrieved and passed to a cytotechnologist in the room. The initial aspirate was negative for visualized spicules/bone particles. Secondary aspirate was performed with heparin. The Murphy needle was then advanced without the stylet for a core biopsy. The core biopsy was retrieved and also passed to a cytotechnologist. A second core biopsy was achieved given the absence of bone particles. Manual pressure was used for  hemostasis and a sterile dressing was placed. No complications were encountered no significant blood loss was encountered. Patient tolerated the procedure well and remained hemodynamically stable throughout. IMPRESSION: Status post CT-guided bone marrow biopsy, with tissue specimen sent to pathology for complete histopathologic analysis Signed, Dulcy Fanny. Earleen Newport, DO [facility] Radiology Electronically Signed   By: Corrie Mckusick D.O.   On: 06/16/2018 12:03   Dg Inject Diag/thera/inc Needle/cath/plc Epi/lumb/sac W/img  Result Date: 05/27/2018 CLINICAL DATA:  Previous fusion surgery. Back and right lower extremity pain. EXAM: SELECTIVE NERVE ROOT BLOCK AND TRANSFORAMINAL EPIDURAL STEROID INJECTION UNDER FLUOROSCOPY FLUOROSCOPY TIME:  43 seconds; 59 uGym2 DAP TECHNIQUE: The procedure, risks (including but not limited to bleeding, infection, organ damage ), benefits, and alternatives were explained to the patient. Questions regarding the procedure were encouraged and answered. The patient understands and consents to the procedure. An appropriate skin entry site was determined under fluoroscopy. Operator donned sterile gloves and mask. Site was marked, prepped with Betadine, draped in usual sterile fashion, infiltrated locally with 1% lidocaine. A 22 gauge spinal needle was advanced to the superior ventral margin of the right L2-3 neural foramen. Diagnostic injection of 2 ml Omnipaque 180 showed partial outlining of the exiting nerve root as well as minimal epidural extension of contrast, with no intravascular or subarachnoid component. 120 mg Depo-Medrol in 3 ml lidocaine 1% was administered. The patient tolerated procedure well, with no immediate complication. IMPRESSION: 1. Technically successful right L2 selective nerve root block and transforaminal epidural steroid injection Electronically Signed   By: Lucrezia Europe M.D.   On: 05/27/2018 15:28     CBC Recent Labs   Lab 06/16/18 0503  06/17/18 0424 06/20/18 0612  WBC 11.0* 9.6 11.7*  HGB 9.4* 9.4* 8.9*  HCT 28.7* 27.7* 27.9*  PLT 158 155 164  MCV 101.1* 100.4* 105.7*  MCH 33.1 34.1* 33.7  MCHC 32.8 33.9 31.9  RDW 19.6* 18.9* 18.4*  LYMPHSABS 4.4* 3.4  --   MONOABS 1.8* 2.1*  --   EOSABS 0.1 0.1  --   BASOSABS 0.1 0.1  --     Chemistries  Recent Labs  Lab 06/17/18 0424 06/18/18 0623 06/19/18 0606 06/20/18 0603 06/21/18 0544 06/22/18 0432  NA 135  134* 134* 134* 137 136 138  K 4.1  4.1 4.0 3.8 4.2 4.5 5.2*  CL 99  97* 97* 99 102 105 105  CO2 _0 21* 22 19* 21*  GLUCOSE 84  85 132* 86 80 89 84  BUN 58*  58* 68* 76* 76* 76* 76*  CREATININE 4.30*  4.33* 4.46* 4.68* 4.78* 4.80* 4.78*  CALCIUM 11.6*  11.6* 10.7* 9.8 9.6 9.6 9.9  AST 20  --   --   --   --   --   ALT 18  --   --   --   --   --   ALKPHOS 71  --   --   --   --   --   BILITOT 0.5  --   --   --   --   --    ------------------------------------------------------------------------------------------------------------------ No results for input(s): CHOL, HDL, LDLCALC, TRIG, CHOLHDL, LDLDIRECT in the last 72 hours.  No results found for: HGBA1C ------------------------------------------------------------------------------------------------------------------ No results for input(s): TSH, T4TOTAL, T3FREE, THYROIDAB in the last 72 hours.  Invalid input(s): FREET3 ------------------------------------------------------------------------------------------------------------------ No results for input(s): VITAMINB12, FOLATE, FERRITIN, TIBC, IRON, RETICCTPCT in the last 72 hours.  Coagulation profile No results for input(s): INR, PROTIME in the last 168 hours.  No results for input(s): DDIMER in the last 72 hours.  Cardiac Enzymes No results for input(s): CKMB, TROPONINI, MYOGLOBIN in the last 168 hours.  Invalid input(s):  CK ------------------------------------------------------------------------------------------------------------------ No results found for: BNP   Roxan Hockey M.D on 06/22/2018 at 5:52 PM  Go to [email or web address] - for contact info  [facility] - Office  [phone]

## 2018-06-22 NOTE — Care Management Important Message (Signed)
Important Message  Patient Details  Name: [name] MRN: [id number] Date of Birth: 04-Dec-1960   Medicare Important Message Given:  Yes    Tommy Medal 06/22/2018, 9:38 AM

## 2018-06-23 ENCOUNTER — Encounter (HOSPITAL_COMMUNITY): Payer: Self-pay

## 2018-06-23 ENCOUNTER — Ambulatory Visit (HOSPITAL_COMMUNITY): Payer: Medicare Other

## 2018-06-23 VITALS — BP 135/72 | HR 95 | Temp 98.4°F | Resp 18 | Wt 158.1 lb

## 2018-06-23 LAB — HEPATIC FUNCTION PANEL
ALT: 25 U/L (ref 0–44)
AST: 30 U/L (ref 15–41)
Albumin: 3.4 g/dL — ABNORMAL LOW (ref 3.5–5.0)
Alkaline Phosphatase: 82 U/L (ref 38–126)
Bilirubin, Direct: 0.1 mg/dL (ref 0.0–0.2)
Indirect Bilirubin: 0.5 mg/dL (ref 0.3–0.9)
Total Bilirubin: 0.6 mg/dL (ref 0.3–1.2)
Total Protein: 6.2 g/dL — ABNORMAL LOW (ref 6.5–8.1)

## 2018-06-23 LAB — CBC
HCT: 27.3 % — ABNORMAL LOW (ref 39.0–52.0)
Hemoglobin: 8.2 g/dL — ABNORMAL LOW (ref 13.0–17.0)
MCH: 32.9 pg (ref 26.0–34.0)
MCHC: 30 g/dL (ref 30.0–36.0)
MCV: 109.6 fL — ABNORMAL HIGH (ref 80.0–100.0)
Platelets: 146 10*3/uL — ABNORMAL LOW (ref 150–400)
RBC: 2.49 MIL/uL — ABNORMAL LOW (ref 4.22–5.81)
RDW: 18.5 % — ABNORMAL HIGH (ref 11.5–15.5)
WBC: 9.7 10*3/uL (ref 4.0–10.5)
nRBC: 1 % — ABNORMAL HIGH (ref 0.0–0.2)

## 2018-06-23 LAB — RENAL FUNCTION PANEL
Albumin: 3.3 g/dL — ABNORMAL LOW (ref 3.5–5.0)
Anion gap: 11 (ref 5–15)
BUN: 79 mg/dL — ABNORMAL HIGH (ref 6–20)
CO2: 18 mmol/L — ABNORMAL LOW (ref 22–32)
Calcium: 9.6 mg/dL (ref 8.9–10.3)
Chloride: 107 mmol/L (ref 98–111)
Creatinine, Ser: 5.12 mg/dL — ABNORMAL HIGH (ref 0.61–1.24)
GFR calc Af Amer: 13 mL/min — ABNORMAL LOW (ref >60)
GFR calc non Af Amer: 11 mL/min — ABNORMAL LOW (ref >60)
Glucose, Bld: 79 mg/dL (ref 70–99)
Phosphorus: 5.3 mg/dL — ABNORMAL HIGH (ref 2.5–4.6)
Potassium: 5 mmol/L (ref 3.5–5.1)
Sodium: 136 mmol/L (ref 135–145)

## 2018-06-23 LAB — DIFFERENTIAL
Basophils Absolute: 0 10*3/uL (ref 0.0–0.1)
Basophils Relative: 0 %
Eosinophils Absolute: 0.1 10*3/uL (ref 0.0–0.5)
Eosinophils Relative: 1 %
Lymphocytes Relative: 30 %
Lymphs Abs: 3.1 10*3/uL (ref 0.7–4.0)
Monocytes Absolute: 2 10*3/uL — ABNORMAL HIGH (ref 0.1–1.0)
Monocytes Relative: 19 %
Neutro Abs: 4.7 10*3/uL (ref 1.7–7.7)
Neutrophils Relative %: 45 %

## 2018-06-23 MED ORDER — TORSEMIDE 20 MG PO TABS: 40.0000 mg | ORAL_TABLET | Freq: Every day | ORAL | 0 refills | Status: DC

## 2018-06-23 MED ORDER — SODIUM CHLORIDE 0.9 % IV SOLN
500.0000 mg | Freq: Once | INTRAVENOUS | Status: AC
  Administered 2018-06-23: 500 mg via INTRAVENOUS
  Filled 2018-06-23: qty 25

## 2018-06-23 MED ORDER — SENNOSIDES-DOCUSATE SODIUM 8.6-50 MG PO TABS: 2.0000 | ORAL_TABLET | Freq: Two times a day (BID) | ORAL | 0 refills | Status: AC

## 2018-06-23 MED ORDER — SODIUM CHLORIDE 0.9 % IV SOLN
Freq: Once | INTRAVENOUS | Status: AC
  Administered 2018-06-23: 12:00:00 via INTRAVENOUS

## 2018-06-23 MED ORDER — OXYCODONE-ACETAMINOPHEN 7.5-325 MG PO TABS: 1.0000 | ORAL_TABLET | ORAL | 0 refills | Status: DC | PRN

## 2018-06-23 MED ORDER — BORTEZOMIB CHEMO SQ INJECTION 3.5 MG (2.5MG/ML)
1.3000 mg/m2 | Freq: Once | INTRAMUSCULAR | Status: AC
  Administered 2018-06-23: 2.5 mg via SUBCUTANEOUS
  Filled 2018-06-23: qty 1

## 2018-06-23 MED ORDER — ENSURE ENLIVE PO LIQD: 237.0000 mL | Freq: Two times a day (BID) | ORAL | 12 refills | Status: DC

## 2018-06-23 MED ORDER — PALONOSETRON HCL INJECTION 0.25 MG/5ML
0.2500 mg | Freq: Once | INTRAVENOUS | Status: AC
  Administered 2018-06-23: 0.25 mg via INTRAVENOUS

## 2018-06-23 MED ORDER — SODIUM CHLORIDE 0.9 % IV SOLN
40.0000 mg | Freq: Once | INTRAVENOUS | Status: AC
  Administered 2018-06-23: 40 mg via INTRAVENOUS
  Filled 2018-06-23: qty 4

## 2018-06-23 NOTE — Progress Notes (Signed)
Patient ID: [name], male   DOB: [birth year], 58 y.o.   MRN: [id number]  [facility] Progress Note   Assessment/ Plan:   1. Acute kidney Injury: Most likely myeloma associated renal injury +/- acute injury from hypercalcemia.  Renal function / GFR stable but low.  Calcium is corrected and has started on MM therapy.  Has sig LEE, off IVFs and NaHCO3.  Cont torsemide 41m PO daily at DC.  Will arrange f/u labs for end of this week and close f/u at our office.   2.  Hypercalcemia: Calcium level normalized status post calcitonin and prolia.   3.  Kappa light chain multiple myeloma: Status post cyclophosphamide, bortezomib and dexamethasone with next treatment today.  Bone marrow biopsy Bx 87% plasma cells, Kappa restricted;  4.  Hyponatremia: resolved 5.  Hyperphosphatemia: improved off binders 6.  Anemia: Secondary to multiple myeloma, with low iron saturation and elevated ferritin noted.  ESA/iron therapy per hematology.    Subjective:    > 0.9L UOP after torsemide  Weights down as well  SCr up slightly to 5.12, K 5.0  Rec d7 Chemo today   Objective:   BP 125/66 (BP Location: Right Arm)   Pulse 98   Temp 98.2 F (36.8 C) (Oral)   Resp 18   Ht 6' (1.829 m)   Wt 79.5 kg   SpO2 95%   BMI 23.77 kg/m   Intake/Output Summary (Last 24 hours) at 06/23/2018 1041 Last data filed at 06/23/2018 0900 Gross per 24 hour  Intake 1017 ml  Output 1225 ml  Net -208 ml   Weight change:   Physical Exam: Gen: sitting at edge of bed PSYCH: down/depressed mood/affect CVS: Pulse regular tachycardia, S1 and S2 normal, no murmur/rub Resp: Clear to auscultation bilaterally, no rales or rhonchi Abd: Soft, flat, nontender Ext: 3+ lower extremity edema tapering off distal to knees  Imaging: No results found.  Labs: BMET Recent Labs  Lab 06/17/18 0424 06/18/18 [id number]/21/20 0606 06/20/18 0603 06/21/18 0544 06/22/18 0432 06/23/18 0430  NA 135  134* 134* 134* 137 136  138 136  K 4.1  4.1 4.0 3.8 4.2 4.5 5.2* 5.0  CL 99  97* 97* 99 102 105 105 107  CO2 22  22 22  21* 22 19* 21* 18*  GLUCOSE 84  85 132* 86 80 89 84 79  BUN 58*  58* 68* 76* 76* 76* 76* 79*  CREATININE 4.30*  4.33* 4.46* 4.68* 4.78* 4.80* 4.78* 5.12*  CALCIUM 11.6*  11.6* 10.7* 9.8 9.6 9.6 9.9 9.6  PHOS 6.3* 6.4* 4.9* 5.1* 4.2 4.8* 5.3*   CBC Recent Labs  Lab 06/17/18 0424 06/20/18 0612 06/23/18 0430  WBC 9.6 11.7* 9.7  NEUTROABS 3.7  --   --   HGB 9.4* 8.9* 8.2*  HCT 27.7* 27.9* 27.3*  MCV 100.4* 105.7* 109.6*  PLT 155 164 146*   Medications:    . bisacodyl  10 mg Rectal Once  . feeding supplement (ENSURE ENLIVE)  237 mL Oral BID BM  . folic acid  1 mg Oral Daily  . lidocaine  1 patch Transdermal Q24H  . polyethylene glycol  17 g Oral Daily  . senna-docusate  2 tablet Oral BID  . torsemide  40 mg Oral Daily   RRexene Agent 06/23/2018, 10:41 AM

## 2018-06-23 NOTE — Progress Notes (Signed)
Physical Therapy Treatment Patient Details Name: [name] MRN: [id number] DOB: 03/02/1961 Today's Date: 06/23/2018    History of Present Illness [name] is a 58 y.o. male with medical history significant of back pain since December after a fall, ckd, has been seen by a spine surgeon and told it looks like he may have cancer in his bones.  Pt does nothave a pcp and has had no f/u on this per mri done last month.  He has been taking nsaids otc and tramadol.  Denies n/v/d.  No fevers.  No cough or sob.  No wt loss.  Eating alright but not normally.  No urinary symptoms.  Pt referred for admission for aki, hi ca level, back pain and lactic acidosis.    PT Comments    Patient demonstrates increased endurance/distance for gait training using RW without loss of balance, required verbal cueing to roll walker instead of picking up with good return demonstrated, required sitting rest break before ambulating back to room and taken to procedure by nursing staff after therapy.  Patient will benefit from continued physical therapy in hospital and recommended venue below to increase strength, balance, endurance for safe ADLs and gait.    Follow Up Recommendations  Home health PT;Supervision - Intermittent     Equipment Recommendations  None recommended by PT    Recommendations for Other Services       Precautions / Restrictions Precautions Precautions: Fall Restrictions Weight Bearing Restrictions: No    Mobility  Bed Mobility               General bed mobility comments: Presents seated in chair (assisted by nursing staff)  Transfers Overall transfer level: Modified independent Equipment used: None;Rolling walker (2 wheeled)             General transfer comment: demonstrates good return for bed/wheelchair transfers without using an AD and using RW  Ambulation/Gait Ambulation/Gait assistance: Supervision Gait Distance (Feet): 100 Feet Assistive device: Rolling  walker (2 wheeled) Gait Pattern/deviations: Decreased step length - right;Decreased step length - left;Decreased stride length Gait velocity: decreased   General Gait Details: slightly labored cadence without loss of balance using RW, limited mostly due to c/o fatigue   Stairs             Wheelchair Mobility    Modified Rankin (Stroke Patients Only)       Balance Overall balance assessment: Modified Independent                                          Cognition Arousal/Alertness: Awake/alert Behavior During Therapy: WFL for tasks assessed/performed;Agitated Overall Cognitive Status: No family/caregiver present to determine baseline cognitive functioning                                 General Comments: easily distracted from completing tasks      Exercises General Exercises - Lower Extremity Long Arc Quad: Seated;AROM;Strengthening;Both;10 reps Toe Raises: Seated;Strengthening;AROM;Both;10 reps Heel Raises: Seated;Strengthening;AROM;Both;10 reps    General Comments        Pertinent Vitals/Pain Pain Assessment: 0-10 Pain Score: 5  Pain Location: low back, neck Pain Descriptors / Indicators: Aching Pain Intervention(s): Limited activity within patient's tolerance;Monitored during session;Premedicated before session    Home Living  Prior Function            PT Goals (current goals can now be found in the care plan section) Acute Rehab PT Goals Patient Stated Goal: To return home PT Goal Formulation: With patient Time For Goal Achievement: 06/29/18 Potential to Achieve Goals: Good Progress towards PT goals: Progressing toward goals    Frequency    Min 3X/week      PT Plan Current plan remains appropriate    Co-evaluation              AM-PAC PT "6 Clicks" Mobility   Outcome Measure  Help needed turning from your back to your side while in a flat bed without using bedrails?:  None Help needed moving from lying on your back to sitting on the side of a flat bed without using bedrails?: None Help needed moving to and from a bed to a chair (including a wheelchair)?: None Help needed standing up from a chair using your arms (e.g., wheelchair or bedside chair)?: None Help needed to walk in hospital room?: A Little Help needed climbing 3-5 steps with a railing? : A Little 6 Click Score: 22    End of Session   Activity Tolerance: Patient tolerated treatment well;Patient limited by fatigue Patient left: in chair;with call bell/phone within reach;with nursing/sitter in room Nurse Communication: Mobility status PT Visit Diagnosis: Unsteadiness on feet (R26.81);Other abnormalities of gait and mobility (R26.89);Muscle weakness (generalized) (M62.81)     Time: 4944-9675 PT Time Calculation (min) (ACUTE ONLY): 31 min  Charges:  $Gait Training: 8-22 mins $Therapeutic Exercise: 8-22 mins                     10:59 AM, 06/23/18 [name], MPT Physical Therapist with [facility] 336 [phone] office [phone] mobile phone

## 2018-06-23 NOTE — Progress Notes (Signed)
Labs reviewed by MD at office visit. Proceed with treatment.

## 2018-06-23 NOTE — Progress Notes (Signed)
Pt IV removed before he came down from cancer center. D/C instructions given to pt, Verbalized understanding. Daughter at short stay entrance to transport home.

## 2018-06-23 NOTE — Discharge Summary (Signed)
Physician Discharge Summary  [name] OJJ:[id number] DOB: 08-31-1960 DOA: 06/12/2018  PCP: Patient, No Pcp Per  Admit date: 06/12/2018  Discharge date: 06/23/2018  Admitted From:Home    Disposition:  Home  Recommendations for Outpatient Follow-up:  1. Follow up with PCP in 1-2 weeks 2. Please obtain BMP in one week 3. Follow-up with nephrology as scheduled 4. Continue on torsemide 40 mg daily as prescribed  Home Health: Yes  Equipment/Devices: None  Discharge Condition: Stable  CODE STATUS: Full  Diet recommendation: Heart Healthy  Brief/Interim Summary: Per HPI: 58 year old male with a history of COPD, substance abuse, spinal stenosis and lumbar radiculopathy status post fusion 07/24/2015([place])presenting with abdominal pain radiating to the bilateral flanks as well as nausea without any emesis or diarrhea. He denies any fevers, chills, chest pain, shortness of breath, coughing. Patient had MRI of the lumbar spine on 05/06/2018 which showed abnormal right prevertebral and epidural soft tissue at L2-3 concerning for tumor infection. There is also right L3 nerve impingement. There is moderate L4-5 canal stenosis with moderate to severe L2-3 narrowing. The patient subsequently had L2 nerve block performed on 05/27/2018.  Admitted on 06/12/2018 with back pain and hypercalcemia with corrected calcium of 13.1 and acute AKI on CKD--in the setting of NSAID use and multiple myeloma-- Renal and heme/onc were consulted to assist with management.  Patient has now undergone chemotherapy treatments per oncology and is now stable for discharge.  AKI was most likely myeloma associated and calcium has corrected.  Patient will remain on 40 mg torsemide daily on discharge and follow-up with nephrology as scheduled as well as oncology.  He will be set up with home health physical therapy as well as nursing and aide.  Discharge Diagnoses:  Principal Problem:   AKI (acute kidney injury)  ([place]) Active Problems:   Tobacco abuse   Substance abuse ([place])   Personality disorder ([place])   Panlobular emphysema ([place])   Chronic left-sided low back pain with left-sided sciatica   Left lumbar radiculopathy   PNA (pneumonia)   Hypercalcemia   Macrocytic anemia   Lumbar epidural mass ([facility])   Kappa light chain myeloma ([place])  Principal discharge diagnosis: AKI secondary to kappa light chain multiple myeloma as well as hypercalcemia.  Discharge Instructions  Discharge Instructions    Diet - low sodium heart healthy   Complete by:  As directed    Increase activity slowly   Complete by:  As directed      Allergies as of 06/23/2018      Reactions   Acetaminophen Nausea Only, Other (See Comments)   Elevated liver enzymes   Cymbalta [duloxetine Hcl] Swelling   Facial swelling   Diclofenac Sodium Swelling      Imodium [loperamide] Swelling   facial   Lyrica [pregabalin] Swelling   Morphine Other (See Comments)   Bradycardia   Vioxx [rofecoxib] Swelling   Aleve [naproxen] Swelling   eye      Medication List    STOP taking these medications   OVER THE COUNTER MEDICATION     TAKE these medications   bortezomib IV 3.5 MG injection Commonly known as:  VELCADE Inject 1.3 mg/m2 into the vein once a week.   CYCLOPHOSPHAMIDE IV Inject into the vein once a week.   feeding supplement (ENSURE ENLIVE) Liqd Take 237 mLs by mouth 2 (two) times daily between meals.   lidocaine 5 % Commonly known as:  Lidoderm Place 1 patch onto the skin daily. Remove & Discard patch within 12  hours or as directed by MD   oxyCODONE-acetaminophen 7.5-325 MG tablet Commonly known as:  PERCOCET Take 1-2 tablets by mouth every 4 (four) hours as needed for moderate pain or severe pain.   senna-docusate 8.6-50 MG tablet Commonly known as:  Senokot-S Take 2 tablets by mouth 2 (two) times daily for 30 days.   torsemide 20 MG tablet Commonly known as:  DEMADEX Take 2 tablets (40 mg total) by  mouth daily for 30 days. Start taking on:  June 24, 2018   traMADol 50 MG tablet Commonly known as:  ULTRAM Take 1 tablet (50 mg total) by mouth every 6 (six) hours as needed.      Follow-up Information    Rosita Fire, MD Follow up in 6 day(s).   Specialty:  Internal Medicine Why:  Appointment is Thursday, 06/24/2018 @ 3:00 p.m.  Contact information: [place] [address] [phone]        [place], [place] Follow up.   Why:  we will call to schedule f/u appt and labs Contact information: [address] [place] [address] [phone]          Allergies  Allergen Reactions  . Acetaminophen Nausea Only and Other (See Comments)    Elevated liver enzymes  . Cymbalta [Duloxetine Hcl] Swelling    Facial swelling  . Diclofenac Sodium Swelling       . Imodium [Loperamide] Swelling    facial  . Lyrica [Pregabalin] Swelling  . Morphine Other (See Comments)    Bradycardia   . Vioxx [Rofecoxib] Swelling  . Aleve [Naproxen] Swelling    eye    Consultations:  Oncology  Nephrology   Procedures/Studies: Ct Abdomen Pelvis Wo Contrast  Result Date: 06/12/2018 CLINICAL DATA:  Abdominal pain and fever EXAM: CT ABDOMEN AND PELVIS WITHOUT CONTRAST TECHNIQUE: Multidetector CT imaging of the abdomen and pelvis was performed following the standard protocol without IV contrast. Oral contrast was administered. COMPARISON:  Aug 12, 2012 FINDINGS: Lower chest: There are free-flowing pleural effusions bilaterally with bibasilar atelectasis. There is questionable mild consolidation in the posterior left lung base as well. Hepatobiliary: No focal liver lesions are appreciable on this noncontrast enhanced study. The gallbladder wall is not appreciably thickened. There is no biliary duct dilatation. Pancreas: No pancreatic mass or inflammatory focus. Spleen: No splenic lesions are evident. Adrenals/Urinary Tract: Adrenals bilaterally appear unremarkable. Kidneys  bilaterally show no evident mass or hydronephrosis on either side. There is no appreciable renal or ureteral calculus on either side. Urinary bladder is midline with wall thickness within normal limits. Stomach/Bowel: There are sigmoid diverticula without overt diverticulitis. There is wall thickening in portions of the mid sigmoid colon without surrounding inflammation. Suspect muscular hypertrophy in this area from chronic diverticulosis. No bowel wall thickening is noted elsewhere. No bowel obstruction appreciable. No free air or portal venous air. Vascular/Lymphatic: There is aortic and bilateral iliac artery atherosclerosis. No aneurysm evident. There is no appreciable adenopathy in the abdomen or pelvis. Reproductive: Prostate and seminal vesicles appear normal in size and contour. There is no appreciable pelvic mass. Other: Appendix appears normal. No abscess or ascites is evident in the abdomen or pelvis. Musculoskeletal: There is extensive postoperative change from L1-L4. There is arthropathy throughout the lumbar region. There are no blastic or lytic bone lesions. There is no intramuscular or abdominal wall lesion evident. IMPRESSION: 1. Moderate free-flowing pleural effusions bilaterally with bibasilar atelectasis. Question a degree of superimposed pneumonia in the posterior left base. 2. Wall thickening  in the mid sigmoid colon, likely due to muscular hypertrophy from chronic diverticulosis. Earliest changes of diverticulitis in this area are difficult to entirely exclude. No overt diverticulitis is evident. 3. No bowel obstruction. Appendix appears normal. No evident abscess in the abdomen or pelvis. 4. No evident renal or ureteral calculus. No evident hydronephrosis on either side. 5.  Aortoiliac atherosclerosis. 6.  Extensive postoperative change throughout the lumbar spine. Electronically Signed   By: Lowella Grip III M.D.   On: 06/12/2018 17:14   Dg Chest 2 View  Result Date:  06/12/2018 CLINICAL DATA:  Stomach pain for a week. No vomiting or diarrhea. Fever. EXAM: CHEST - 2 VIEW COMPARISON:  CT chest 05/28/2016. Chest 11/04/2010 FINDINGS: Normal heart size and pulmonary vascularity. Small left pleural effusion with infiltration in the left lung base. This suggests pneumonia. Right lung is clear. No pneumothorax. Mediastinal contours appear intact. IMPRESSION: Small left pleural effusion with infiltration in the left lung base suggesting pneumonia. Electronically Signed   By: Lucienne Capers M.D.   On: 06/12/2018 21:18   Ct Chest Wo Contrast  Result Date: 06/14/2018 CLINICAL DATA:  58 year old male with history of chest pain and pleural effusion. EXAM: CT CHEST WITHOUT CONTRAST TECHNIQUE: Multidetector CT imaging of the chest was performed following the standard protocol without IV contrast. COMPARISON:  Chest CT 05/20/2016.  Chest x-ray 06/12/2018. FINDINGS: Cardiovascular: Heart size is normal. There is no significant pericardial fluid, thickening or pericardial calcification. Aortic atherosclerosis. No coronary artery calcifications. Mediastinum/Nodes: No pathologically enlarged mediastinal or hilar lymph nodes. Esophagus is unremarkable in appearance. No axillary lymphadenopathy. Lungs/Pleura: Multiple tiny 2-3 mm pulmonary nodules scattered throughout both lungs, several of which are calcified, compatible with benign granulomas. No larger more suspicious appearing pulmonary nodules or masses are noted. Moderate-sized bilateral pleural effusions lying dependently. This is associated with some mild passive atelectasis in the lower lobes of the lungs bilaterally. No acute consolidative airspace disease. Diffuse bronchial wall thickening with mild centrilobular and paraseptal emphysema. Upper Abdomen: Aortic atherosclerosis. Musculoskeletal: There are no aggressive appearing lytic or blastic lesions noted in the visualized portions of the skeleton. IMPRESSION: 1. Moderate bilateral  pleural effusions lying dependently with areas of passive subsegmental atelectasis in the lower lobes of the lungs bilaterally. 2. Multiple tiny 2-3 mm pulmonary nodules scattered throughout the lungs bilaterally, nonspecific, but similar to prior study from 05/28/2016, considered definitively benign. 3. Aortic atherosclerosis. 4. Diffuse bronchial wall thickening with mild centrilobular and paraseptal emphysema; imaging findings suggestive of underlying COPD. Aortic Atherosclerosis (ICD10-I70.0) and Emphysema (ICD10-J43.9). Electronically Signed   By: Vinnie Langton M.D.   On: 06/14/2018 10:53   Mr Thoracic Spine Wo Contrast  Result Date: 06/14/2018 CLINICAL DATA:  Mid low back pain for 2 months EXAM: MRI THORACIC AND LUMBAR SPINE WITHOUT CONTRAST TECHNIQUE: Multiplanar and multiecho pulse sequences of the thoracic and lumbar spine were obtained without intravenous contrast. COMPARISON:  Lumbar spine MRI 05/06/2018 FINDINGS: Severe patient motion degrading image quality limiting evaluation. Portion of the examination are nondiagnostic. MRI THORACIC SPINE FINDINGS Alignment:  Physiologic. Vertebrae: No fracture, evidence of discitis, or bone lesion. Cord:  Normal signal and morphology. Paraspinal and other soft tissues: No acute paraspinal abnormality. Disc levels: Disc spaces:  Disc spaces are relatively preserved. C6-7: Mild broad-based disc bulge. No foraminal or central canal stenosis. T1-T2: No disc protrusion, foraminal stenosis or central canal stenosis. T2-T3: No disc protrusion, foraminal stenosis or central canal stenosis. T3-T4: Broad-based disc bulge. Moderate bilateral facet arthropathy. Mild spinal stenosis. T4-T5:  No disc protrusion, foraminal stenosis or central canal stenosis. Bilateral mild facet arthropathy. T5-T6: No disc protrusion, foraminal stenosis or central canal stenosis. T6-T7: No disc protrusion, foraminal stenosis or central canal stenosis. T7-T8: No disc protrusion, foraminal  stenosis or central canal stenosis. T8-T9: No disc protrusion, foraminal stenosis or central canal stenosis. T9-T10: No disc protrusion, foraminal stenosis or central canal stenosis. T10-T11: No disc protrusion, foraminal stenosis or central canal stenosis. T11-T12: No disc protrusion, foraminal stenosis or central canal stenosis. MRI LUMBAR SPINE FINDINGS Segmentation:  Standard. Alignment:  Physiologic. Vertebrae: No fracture, evidence of discitis, or aggressive bone lesion. Low marrow signal throughout the lumbar spine which is limited in evaluation secondary to patient motion. Conus medullaris and cauda equina: Conus extends to the T12 level. Conus and cauda equina appear normal. Paraspinal and other soft tissues: No acute paraspinal abnormality. Disc levels: Disc spaces: Posterior lumbar interbody fusion from L1 through L4. T12-L1: Minimal broad-based disc bulge. Mild bilateral facet arthropathy. No evidence of neural foraminal stenosis. No central canal stenosis. L1-L2: Interbody fusion. No evidence of neural foraminal stenosis. No central canal stenosis. L2-L3: Interbody fusion. Evaluation is extremely limited secondary to patient motion degrading image quality. There is suggestion of persistent right central/right paracentral epidural soft tissue, but characterization is extremely difficult for given the degree of patient motion. Right foraminal stenosis. No central canal stenosis. L3-L4: Interbody fusion. No evidence of neural foraminal stenosis. No central canal stenosis. L4-L5: Minimal broad-based disc bulge. Mild bilateral facet scratch them moderate bilateral facet arthropathy. Mild left foraminal stenosis. No right foraminal stenosis. No central canal stenosis. L5-S1: Broad-based disc bulge. Moderate bilateral facet arthropathy. Mild left foraminal stenosis. No significant right foraminal stenosis. No central canal stenosis. IMPRESSION: Severe patient motion degrading image quality limiting evaluation.  Portion of the examination are nondiagnostic. MR THORACIC SPINE IMPRESSION 1. No aggressive osseous lesion to suggest malignancy. 2.  No acute osseous injury of the thoracic spine. MR LUMBAR SPINE IMPRESSION 1. L2-3 is extremely limited in evaluation secondary to patient motion degrading image quality. There is suggestion of persistent right central/right paracentral epidural soft tissue, but characterization is extremely difficult for given the degree of patient motion. Right foraminal stenosis. Recommend repeat MRI of the lumbar spine when the patient is able to better tolerate the examination with less patient motion. 2.  No acute osseous injury of the lumbar spine. Electronically Signed   By: Kathreen Devoid   On: 06/14/2018 19:51   Mr Lumbar Spine Wo Contrast  Result Date: 06/14/2018 CLINICAL DATA:  Mid low back pain for 2 months EXAM: MRI THORACIC AND LUMBAR SPINE WITHOUT CONTRAST TECHNIQUE: Multiplanar and multiecho pulse sequences of the thoracic and lumbar spine were obtained without intravenous contrast. COMPARISON:  Lumbar spine MRI 05/06/2018 FINDINGS: Severe patient motion degrading image quality limiting evaluation. Portion of the examination are nondiagnostic. MRI THORACIC SPINE FINDINGS Alignment:  Physiologic. Vertebrae: No fracture, evidence of discitis, or bone lesion. Cord:  Normal signal and morphology. Paraspinal and other soft tissues: No acute paraspinal abnormality. Disc levels: Disc spaces:  Disc spaces are relatively preserved. C6-7: Mild broad-based disc bulge. No foraminal or central canal stenosis. T1-T2: No disc protrusion, foraminal stenosis or central canal stenosis. T2-T3: No disc protrusion, foraminal stenosis or central canal stenosis. T3-T4: Broad-based disc bulge. Moderate bilateral facet arthropathy. Mild spinal stenosis. T4-T5: No disc protrusion, foraminal stenosis or central canal stenosis. Bilateral mild facet arthropathy. T5-T6: No disc protrusion, foraminal stenosis or  central canal stenosis. T6-T7: No disc protrusion, foraminal  stenosis or central canal stenosis. T7-T8: No disc protrusion, foraminal stenosis or central canal stenosis. T8-T9: No disc protrusion, foraminal stenosis or central canal stenosis. T9-T10: No disc protrusion, foraminal stenosis or central canal stenosis. T10-T11: No disc protrusion, foraminal stenosis or central canal stenosis. T11-T12: No disc protrusion, foraminal stenosis or central canal stenosis. MRI LUMBAR SPINE FINDINGS Segmentation:  Standard. Alignment:  Physiologic. Vertebrae: No fracture, evidence of discitis, or aggressive bone lesion. Low marrow signal throughout the lumbar spine which is limited in evaluation secondary to patient motion. Conus medullaris and cauda equina: Conus extends to the T12 level. Conus and cauda equina appear normal. Paraspinal and other soft tissues: No acute paraspinal abnormality. Disc levels: Disc spaces: Posterior lumbar interbody fusion from L1 through L4. T12-L1: Minimal broad-based disc bulge. Mild bilateral facet arthropathy. No evidence of neural foraminal stenosis. No central canal stenosis. L1-L2: Interbody fusion. No evidence of neural foraminal stenosis. No central canal stenosis. L2-L3: Interbody fusion. Evaluation is extremely limited secondary to patient motion degrading image quality. There is suggestion of persistent right central/right paracentral epidural soft tissue, but characterization is extremely difficult for given the degree of patient motion. Right foraminal stenosis. No central canal stenosis. L3-L4: Interbody fusion. No evidence of neural foraminal stenosis. No central canal stenosis. L4-L5: Minimal broad-based disc bulge. Mild bilateral facet scratch them moderate bilateral facet arthropathy. Mild left foraminal stenosis. No right foraminal stenosis. No central canal stenosis. L5-S1: Broad-based disc bulge. Moderate bilateral facet arthropathy. Mild left foraminal stenosis. No  significant right foraminal stenosis. No central canal stenosis. IMPRESSION: Severe patient motion degrading image quality limiting evaluation. Portion of the examination are nondiagnostic. MR THORACIC SPINE IMPRESSION 1. No aggressive osseous lesion to suggest malignancy. 2.  No acute osseous injury of the thoracic spine. MR LUMBAR SPINE IMPRESSION 1. L2-3 is extremely limited in evaluation secondary to patient motion degrading image quality. There is suggestion of persistent right central/right paracentral epidural soft tissue, but characterization is extremely difficult for given the degree of patient motion. Right foraminal stenosis. Recommend repeat MRI of the lumbar spine when the patient is able to better tolerate the examination with less patient motion. 2.  No acute osseous injury of the lumbar spine. Electronically Signed   By: Kathreen Devoid   On: 06/14/2018 19:51   US Renal  Result Date: 06/14/2018 CLINICAL DATA:  Acute on chronic renal failure EXAM: RENAL / URINARY TRACT ULTRASOUND COMPLETE COMPARISON:  06/12/2018 FINDINGS: Right Kidney: Renal measurements: 11.6 x 4.9 x 7.6 cm = volume: 224 mL. Diffuse increased echogenicity is noted. No mass or hydronephrosis is noted. Left Kidney: Renal measurements: 11.9 x 6.5 x 5.5 cm = volume: 222 mL. Increased echogenicity is noted. No mass lesion or hydronephrosis is seen. Bladder: Appears normal for degree of bladder distention. Bilateral pleural effusions are noted similar to that seen on prior CT. IMPRESSION: Diffuse increased echogenicity consistent with the given clinical history. Bilateral small effusions. Electronically Signed   By: Inez Catalina M.D.   On: 06/14/2018 10:30   Ct Biopsy  Result Date: 06/16/2018 INDICATION: 58 year old male referred for bone marrow biopsy with history of possible multiple myeloma EXAM: CT BONE MARROW BIOPSY AND ASPIRATION; CT BIOPSY MEDICATIONS: None. ANESTHESIA/SEDATION: Moderate (conscious) sedation was employed  during this procedure. A total of Versed 3.0 mg and Fentanyl 100 mcg was administered intravenously. Moderate Sedation Time: 10 minutes. The patient's level of consciousness and vital signs were monitored continuously by radiology nursing throughout the procedure under my direct supervision. FLUOROSCOPY TIME:  CT COMPLICATIONS: None PROCEDURE: The procedure risks, benefits, and alternatives were explained to the patient. Questions regarding the procedure were encouraged and answered. The patient understands and consents to the procedure. Scout CT of the pelvis was performed for surgical planning purposes. The posterior pelvis was prepped with Chlorhexidine in a sterile fashion, and a sterile drape was applied covering the operative field. A sterile gown and sterile gloves were used for the procedure. Local anesthesia was provided with 1% Lidocaine. Posterior left iliac bone was targeted for biopsy. The skin and subcutaneous tissues were infiltrated with 1% lidocaine without epinephrine. A small stab incision was made with an 11 blade scalpel, and an 11 gauge Murphy needle was advanced with CT guidance to the posterior cortex. Manual forced was used to advance the needle through the posterior cortex and the stylet was removed. A bone marrow aspirate was retrieved and passed to a cytotechnologist in the room. The initial aspirate was negative for visualized spicules/bone particles. Secondary aspirate was performed with heparin. The Murphy needle was then advanced without the stylet for a core biopsy. The core biopsy was retrieved and also passed to a cytotechnologist. A second core biopsy was achieved given the absence of bone particles. Manual pressure was used for hemostasis and a sterile dressing was placed. No complications were encountered no significant blood loss was encountered. Patient tolerated the procedure well and remained hemodynamically stable throughout. IMPRESSION: Status post CT-guided bone marrow  biopsy, with tissue specimen sent to pathology for complete histopathologic analysis Signed, Dulcy Fanny. Earleen Newport, DO [name] Electronically Signed   By: Corrie Mckusick D.O.   On: 06/16/2018 12:03   Dg Bone Survey Met  Result Date: 06/15/2018 CLINICAL DATA:  58 year old male with a history of hypercalcemia EXAM: METASTATIC BONE SURVEY COMPARISON:  Chest CT 06/14/2018, plain film 06/12/2018, lumbar CT 05/06/2018 FINDINGS: Skull: No acute fracture. No lytic or sclerotic lesion identified.  Endodontal disease. Cervical spine: Surgical changes of anterior cervical discectomy infusion with anterior plate screw fixation J6-O1 with no complicating features. Degenerative changes at the levels above and below. No lytic lesions or sclerotic lesions.  Alignment maintained. Thoracic spine: Vertebral body heights maintained with alignment. Mild disc disease of the thoracic spine. No acute fracture. No lytic or sclerotic lesions. Unremarkable appearance of the pedicles. Lumbar spine: Surgical changes of posterior lumbar interbody fusion with bilateral pedicle screw and rod fixation spanning L1-L4. No complicating features. No acute fracture. The lytic changes that were identified on prior CT are not visualized on the current plain film. Moderate disc disease and facet disease of the lumbar spine. No lytic or sclerotic lesions identified. Chest: Cardiomediastinal silhouette within normal limits. Blunting of the bilateral costophrenic angles with obscuration of the hemidiaphragms bilaterally. No interlobular septal thickening. No rib fracture identified. Pelvis: No acute fracture. Sclerotic changes at the inferior aspect of the left sacroiliac joint on the iliac aspect, similar to prior CT abdomen. No lytic changes. Degenerative changes of the hips. Right upper extremity: No acute fracture.  No lucent lesion or sclerotic focus. Left upper extremity: No acute fracture.  No  lucent lesion or sclerotic changes. Right lower extremity: No acute fracture.  No no lucent lesion or sclerotic lesion. Left lower extremity: No acute fracture.  No lucent lesion or sclerotic lesion. IMPRESSION: Abnormal lucent changes identified in the lumbar spine on the prior CT dated 05/06/2018 are not visualized on the current plain film. If concern for malignancy, correlation with either nuclear medicine bone  scan or PET-CT may be useful. Sclerotic focus at the inferior aspect of the left sacral iliac joint, present on prior CT pelvis. If concern for malignancy, correlation with either nuclear medicine bone scan or PET-CT may be useful. Bilateral small pleural effusions, present on prior CT. Electronically Signed   By: Corrie Mckusick D.O.   On: 06/15/2018 09:22   Ct Bone Marrow Biopsy & Aspiration  Result Date: 06/16/2018 INDICATION: 58 year old male referred for bone marrow biopsy with history of possible multiple myeloma EXAM: CT BONE MARROW BIOPSY AND ASPIRATION; CT BIOPSY MEDICATIONS: None. ANESTHESIA/SEDATION: Moderate (conscious) sedation was employed during this procedure. A total of Versed 3.0 mg and Fentanyl 100 mcg was administered intravenously. Moderate Sedation Time: 10 minutes. The patient's level of consciousness and vital signs were monitored continuously by radiology nursing throughout the procedure under my direct supervision. FLUOROSCOPY TIME:  CT COMPLICATIONS: None PROCEDURE: The procedure risks, benefits, and alternatives were explained to the patient. Questions regarding the procedure were encouraged and answered. The patient understands and consents to the procedure. Scout CT of the pelvis was performed for surgical planning purposes. The posterior pelvis was prepped with Chlorhexidine in a sterile fashion, and a sterile drape was applied covering the operative field. A sterile gown and sterile gloves were used for the procedure. Local anesthesia was provided with 1% Lidocaine.  Posterior left iliac bone was targeted for biopsy. The skin and subcutaneous tissues were infiltrated with 1% lidocaine without epinephrine. A small stab incision was made with an 11 blade scalpel, and an 11 gauge Murphy needle was advanced with CT guidance to the posterior cortex. Manual forced was used to advance the needle through the posterior cortex and the stylet was removed. A bone marrow aspirate was retrieved and passed to a cytotechnologist in the room. The initial aspirate was negative for visualized spicules/bone particles. Secondary aspirate was performed with heparin. The Murphy needle was then advanced without the stylet for a core biopsy. The core biopsy was retrieved and also passed to a cytotechnologist. A second core biopsy was achieved given the absence of bone particles. Manual pressure was used for hemostasis and a sterile dressing was placed. No complications were encountered no significant blood loss was encountered. Patient tolerated the procedure well and remained hemodynamically stable throughout. IMPRESSION: Status post CT-guided bone marrow biopsy, with tissue specimen sent to pathology for complete histopathologic analysis Signed, Dulcy Fanny. Earleen Newport, DO [facility] Electronically Signed   By: Corrie Mckusick D.O.   On: 06/16/2018 12:03   Dg Inject Diag/thera/inc Needle/cath/plc Epi/lumb/sac W/img  Result Date: 05/27/2018 CLINICAL DATA:  Previous fusion surgery. Back and right lower extremity pain. EXAM: SELECTIVE NERVE ROOT BLOCK AND TRANSFORAMINAL EPIDURAL STEROID INJECTION UNDER FLUOROSCOPY FLUOROSCOPY TIME:  43 seconds; 30 uGym2 DAP TECHNIQUE: The procedure, risks (including but not limited to bleeding, infection, organ damage ), benefits, and alternatives were explained to the patient. Questions regarding the procedure were encouraged and answered. The patient understands and consents to the procedure. An appropriate skin  entry site was determined under fluoroscopy. Operator donned sterile gloves and mask. Site was marked, prepped with Betadine, draped in usual sterile fashion, infiltrated locally with 1% lidocaine. A 22 gauge spinal needle was advanced to the superior ventral margin of the right L2-3 neural foramen. Diagnostic injection of 2 ml Omnipaque 180 showed partial outlining of the exiting nerve root as well as minimal epidural extension of contrast, with no intravascular or subarachnoid component. 120 mg Depo-Medrol in 3  ml lidocaine 1% was administered. The patient tolerated procedure well, with no immediate complication. IMPRESSION: 1. Technically successful right L2 selective nerve root block and transforaminal epidural steroid injection Electronically Signed   By: Lucrezia Europe M.D.   On: 05/27/2018 15:28     Discharge Exam: Vitals:   06/23/18 0726 06/23/18 0818  BP: 125/66   Pulse: 98   Resp: 18   Temp: 98.2 F (36.8 C)   SpO2: 96% 95%   Vitals:   06/22/18 2110 06/23/18 0517 06/23/18 0726 06/23/18 0818  BP: 123/62 (!) 144/67 125/66   Pulse: 96 (!) 103 98   Resp: 18 20 18    Temp: 98.9 F (37.2 C) 98.3 F (36.8 C) 98.2 F (36.8 C)   TempSrc: Oral Oral Oral   SpO2: 97% 96% 96% 95%  Weight:  79.5 kg    Height:        General: Pt is alert, awake, not in acute distress Cardiovascular: RRR, S1/S2 +, no rubs, no gallops Respiratory: CTA bilaterally, no wheezing, no rhonchi Abdominal: Soft, NT, ND, bowel sounds + Extremities: no edema, no cyanosis    The results of significant diagnostics from this hospitalization (including imaging, microbiology, ancillary and laboratory) are listed below for reference.     Microbiology: No results found for this or any previous visit (from the past 240 hour(s)).   Labs: BNP (last 3 results) No results for input(s): BNP in the last 8760 hours. Basic Metabolic Panel: Recent Labs  Lab 06/19/18 0606 06/20/18 0603 06/21/18 0544 06/22/18 0432  06/23/18 0430  NA 134* 137 136 138 136  K 3.8 4.2 4.5 5.2* 5.0  CL 99 102 105 105 107  CO2 21* 22 19* 21* 18*  GLUCOSE 86 80 89 84 79  BUN 76* 76* 76* 76* 79*  CREATININE 4.68* 4.78* 4.80* 4.78* 5.12*  CALCIUM 9.8 9.6 9.6 9.9 9.6  PHOS 4.9* 5.1* 4.2 4.8* 5.3*   Liver Function Tests: Recent Labs  Lab 06/17/18 0424  06/19/18 0606 06/20/18 0603 06/21/18 0544 06/22/18 0432 06/23/18 0430  AST 20  --   --   --   --   --  30  ALT 18  --   --   --   --   --  25  ALKPHOS 71  --   --   --   --   --  82  BILITOT 0.5  --   --   --   --   --  0.6  PROT 6.6  --   --   --   --   --  6.2*  ALBUMIN 3.6  3.7   < > 3.7 3.6 3.6 3.6 3.4*  3.3*   < > = values in this interval not displayed.   No results for input(s): LIPASE, AMYLASE in the last 168 hours. No results for input(s): AMMONIA in the last 168 hours. CBC: Recent Labs  Lab 06/17/18 0424 06/20/18 0612 06/23/18 0430  WBC 9.6 11.7* 9.7  NEUTROABS 3.7  --  4.7  HGB 9.4* 8.9* 8.2*  HCT 27.7* 27.9* 27.3*  MCV 100.4* 105.7* 109.6*  PLT 155 164 146*   Cardiac Enzymes: No results for input(s): CKTOTAL, CKMB, CKMBINDEX, TROPONINI in the last 168 hours. BNP: Invalid input(s): POCBNP CBG: No results for input(s): GLUCAP in the last 168 hours. D-Dimer No results for input(s): DDIMER in the last 72 hours. Hgb A1c No results for input(s): HGBA1C in the last 72 hours. Lipid Profile No results for  input(s): CHOL, HDL, LDLCALC, TRIG, CHOLHDL, LDLDIRECT in the last 72 hours. Thyroid function studies No results for input(s): TSH, T4TOTAL, T3FREE, THYROIDAB in the last 72 hours.  Invalid input(s): FREET3 Anemia work up No results for input(s): VITAMINB12, FOLATE, FERRITIN, TIBC, IRON, RETICCTPCT in the last 72 hours. Urinalysis    Component Value Date/Time   COLORURINE YELLOW 06/12/2018 1356   APPEARANCEUR CLEAR 06/12/2018 1356   LABSPEC 1.012 06/12/2018 1356   PHURINE 5.0 06/12/2018 1356   GLUCOSEU NEGATIVE 06/12/2018 1356    HGBUR MODERATE (A) 06/12/2018 1356   BILIRUBINUR NEGATIVE 06/12/2018 1356   [place] 06/12/2018 1356   PROTEINUR 30 (A) 06/12/2018 1356   UROBILINOGEN 0.2 02/09/2008 2042   NITRITE NEGATIVE 06/12/2018 [place] 06/12/2018 1356   Sepsis Labs Invalid input(s): PROCALCITONIN,  WBC,  LACTICIDVEN Microbiology No results found for this or any previous visit (from the past 240 hour(s)).   Time coordinating discharge: 35 minutes  SIGNED:   Rodena Goldmann, DO [facility] 06/23/2018, 12:58 PM  If 7PM-7AM, please contact night-coverage [email or web address] Password [email or web address]

## 2018-06-23 NOTE — Progress Notes (Signed)
Pt an inpatient sent to [name] for treatment. Labs reviewed by Dr. Delton Coombes. Creat. 5.12. MD aware. Proceed with treatment per MD. VSS.    Report given to [place] Foley RN on third floor. Reported VSS, pt status, medications received  today and pt assessment.   Treatment given today per MD orders. Tolerated infusion without adverse affects. Vital signs stable. No complaints at this time. Discharged from clinic via wheel chair and escorted by [employer] CNA. F/U with [facility] as scheduled.

## 2018-06-23 NOTE — Progress Notes (Signed)
Called and spoke with Dr. Delton Coombes pertaining to f/u appointments for lab, treatment, and office visit. Per Dr. Delton Coombes. Place pt on appointment schedule for next week with labs, treatment and office visit all in the same day. Arranged with scheduling and spoke with DStrader.

## 2018-06-23 NOTE — TOC Progression Note (Addendum)
Transition of Care [facility]) - Progression Note    Patient Details  Name: [name] MRN: [id number] Date of Birth: 1961/03/25  [facility] CM/SW Contact  Ihor Gully, LCSW Phone Number: 06/23/2018, 4:31 PM  Clinical Narrative:    Patient discharged home with HHPT (PT, nurse, aide). PCP established with Dr. Legrand Rams. D/C summary sent to Dr. Legrand Rams.    Expected Discharge Plan: [place] Barriers to Discharge: No Barriers Identified  Expected Discharge Plan and Services Expected Discharge Plan: [place] Choice: Home Health   Expected Discharge Date: 06/23/18                         Social Determinants of Health (SDOH) Interventions    Readmission Risk Interventions Readmission Risk Prevention Plan 06/14/2018  Transportation Screening Complete  Social Work Consult for [place] Planning/Counseling Complete  Some recent data might be hidden

## 2018-06-23 NOTE — TOC Transition Note (Addendum)
Transition of Care [facility]) - CM/SW Discharge Note   Patient Details  Name: [name] MRN: [id number] Date of Birth: [birth year]  Transition of Care [facility]) CM/SW Contact:  Ihor Gully, LCSW Phone Number: 06/23/2018, 4:32 PM   Clinical Narrative:    Patient discharged home with HHPT (nurse, PT, aide). PCP care established with Dr. Legrand Rams. D/C summary sent to Dr. Legrand Rams.   Final next level of care: [place] Barriers to Discharge: No Barriers Identified   Patient Goals and CMS Choice Patient states their goals for this hospitalization and ongoing recovery are:: To have pain managed and get PCP options.  CMS Medicare.gov Compare Post Acute Care list provided to:: Patient Choice offered to / list presented to : Patient  Discharge Placement                       Discharge Plan and Services     Post Acute Care Choice: Home Health                    Social Determinants of Health (SDOH) Interventions     Readmission Risk Interventions Readmission Risk Prevention Plan 06/14/2018  Transportation Screening Complete  Social Work Consult for [place] Planning/Counseling Complete  Some recent data might be hidden

## 2018-06-23 NOTE — Consult Note (Signed)
[facility] Oncology Progress Note  Name: [name]      MRN: [id number]    Location: J155/M080-22  Date: 06/23/2018 Time:3:53 PM   Subjective: Interval History:[name] is seen today for follow-up of multiple myeloma and hypercalcemia.  He denies any new onset pains.  He continues to have back pain.  Denies any fevers or nausea or vomiting or diarrhea overnight.  Objective: Vital signs in last 24 hours: Temp:  [98.2 F (36.8 C)-98.9 F (37.2 C)] 98.4 F (36.9 C) (03/25 1421) Pulse Rate:  [95-103] 95 (03/25 1421) Resp:  [18-20] 18 (03/25 1421) BP: (123-159)/(62-72) 135/72 (03/25 1421) SpO2:  [95 %-97 %] 95 % (03/25 1421) Weight:  [158 lb 1.6 oz (71.7 kg)-175 lb 4.3 oz (79.5 kg)] 158 lb 1.6 oz (71.7 kg) (03/25 1058)    Intake/Output from previous day: 03/24 0800 - 03/25 0759 In: 960 [P.O.:960] Out: 950 [Urine:950]    Intake/Output this shift: Total I/O In: 824.9 [P.O.:417; I.V.:28.8; IV Piggyback:379.2] Out: 275 [Urine:275]   PHYSICAL EXAM: BP 125/66 (BP Location: Right Arm)   Pulse 98   Temp 98.2 F (36.8 C) (Oral)   Resp 18   Ht 6' (1.829 m)   Wt 175 lb 4.3 oz (79.5 kg)   SpO2 95%   BMI 23.77 kg/m  General appearance: alert and cooperative Lungs: clear to auscultation bilaterally Heart: regular rate and rhythm Abdomen: soft, non-tender; bowel sounds normal; no masses,  no organomegaly Extremities: Bilateral lower extremity edema extending above ankles. Skin: Skin color, texture, turgor normal. No rashes or lesions Lymph nodes: Cervical, supraclavicular, and axillary nodes normal. Neurologic: Grossly normal   Studies/Results: Results for orders placed or performed during the hospital encounter of 06/12/18 (from the past 48 hour(s))  Renal function panel     Status: Abnormal   Collection Time: 06/22/18  4:32 AM  Result Value Ref Range   Sodium 138 135 - 145 mmol/L   Potassium 5.2 (H) 3.5 - 5.1 mmol/L   Chloride 105 98 - 111 mmol/L   CO2  21 (L) 22 - 32 mmol/L   Glucose, Bld 84 70 - 99 mg/dL   BUN 76 (H) 6 - 20 mg/dL   Creatinine, Ser 4.78 (H) 0.61 - 1.24 mg/dL   Calcium 9.9 8.9 - 10.3 mg/dL   Phosphorus 4.8 (H) 2.5 - 4.6 mg/dL   Albumin 3.6 3.5 - 5.0 g/dL   GFR calc non Af Amer 12 (L) >60 mL/min   GFR calc Af Amer 14 (L) >60 mL/min   Anion gap 12 5 - 15    Comment: Performed at [facility], [address], [place], [place] [address]  Renal function panel     Status: Abnormal   Collection Time: 06/23/18  4:30 AM  Result Value Ref Range   Sodium 136 135 - 145 mmol/L   Potassium 5.0 3.5 - 5.1 mmol/L   Chloride 107 98 - 111 mmol/L   CO2 18 (L) 22 - 32 mmol/L   Glucose, Bld 79 70 - 99 mg/dL   BUN 79 (H) 6 - 20 mg/dL   Creatinine, Ser 5.12 (H) 0.61 - 1.24 mg/dL   Calcium 9.6 8.9 - 10.3 mg/dL   Phosphorus 5.3 (H) 2.5 - 4.6 mg/dL   Albumin 3.3 (L) 3.5 - 5.0 g/dL   GFR calc non Af Amer 11 (L) >60 mL/min   GFR calc Af Amer 13 (L) >60 mL/min   Anion gap 11 5 - 15    Comment: Performed at  [address] [place], [place] [address]  CBC     Status: Abnormal   Collection Time: 06/23/18  4:30 AM  Result Value Ref Range   WBC 9.7 4.0 - 10.5 K/uL   RBC 2.49 (L) 4.22 - 5.81 MIL/uL   Hemoglobin 8.2 (L) 13.0 - 17.0 g/dL   HCT 27.3 (L) 39.0 - 52.0 %   MCV 109.6 (H) 80.0 - 100.0 fL   MCH 32.9 26.0 - 34.0 pg   MCHC 30.0 30.0 - 36.0 g/dL   RDW 18.5 (H) 11.5 - 15.5 %   Platelets 146 (L) 150 - 400 K/uL   nRBC 1.0 (H) 0.0 - 0.2 %    Comment: Performed at [facility], [address] [place], [place] [address]  Differential     Status: Abnormal   Collection Time: 06/23/18  4:30 AM  Result Value Ref Range   Neutrophils Relative % 45 %   Neutro Abs 4.7 1.7 - 7.7 K/uL   Lymphocytes Relative 30 %   Lymphs Abs 3.1 0.7 - 4.0 K/uL   Monocytes Relative 19 %   Monocytes Absolute 2.0 (H) 0.1 - 1.0 K/uL   Eosinophils Relative 1 %   Eosinophils Absolute 0.1 0.0 - 0.5 K/uL   Basophils Relative 0 %   Basophils Absolute 0.0  0.0 - 0.1 K/uL   WBC Morphology MILD LEFT SHIFT (1-5% METAS, OCC MYELO, OCC BANDS)     Comment: Performed at [facility], [address] [place], [place] [address]   No results found.     MEDICATIONS: I have reviewed the patient's current medications.     Assessment/Plan:  1.  Kappa light chain myeloma: - Bone marrow biopsy on 06/16/2018 shows 87% plasma cells. -Received cycle 1 day 1 of cyclophosphamide, bortezomib and dexamethasone on 06/17/2018. -I have reviewed his labs.  Creatinine went up slightly.  Calcium is 9.6. - He will proceed with day 8 of cyclophosphamide, bortezomib and dexamethasone today.  I will increase cyclophosphamide dose 200 mg. -I will reevaluate him in my office next week and give treatment. -We will follow-up on the bone marrow biopsy FISH panel.  2.  Hypercalcemia: -Today his calcium level is 9.6. -He has received 1 dose of denosumab and calcitonin twice a day.  3.  Macrocytic anemia: - Hemoglobin is 8.2 with MCV of 109. -This is from combination of myeloma and renal insufficiency. - We will consider erythropoiesis stimulating agents if hemoglobin drops below 8.  All questions were answered. The patient knows to call the clinic with any problems, questions or concerns. We can certainly see the patient much sooner if necessary.     Derek Jack

## 2018-06-24 ENCOUNTER — Ambulatory Visit (HOSPITAL_COMMUNITY): Payer: Medicare Other | Admitting: Hematology

## 2018-06-24 ENCOUNTER — Other Ambulatory Visit (HOSPITAL_COMMUNITY): Payer: Medicare Other

## 2018-06-24 ENCOUNTER — Ambulatory Visit (HOSPITAL_COMMUNITY): Payer: Medicare Other

## 2018-06-28 ENCOUNTER — Encounter (HOSPITAL_COMMUNITY): Payer: Self-pay | Admitting: Hematology

## 2018-06-30 ENCOUNTER — Inpatient Hospital Stay (HOSPITAL_BASED_OUTPATIENT_CLINIC_OR_DEPARTMENT_OTHER): Payer: Medicare Other | Admitting: Hematology

## 2018-06-30 ENCOUNTER — Inpatient Hospital Stay (HOSPITAL_COMMUNITY): Payer: Medicare Other

## 2018-06-30 ENCOUNTER — Inpatient Hospital Stay (HOSPITAL_COMMUNITY): Payer: Medicare Other | Attending: Hematology

## 2018-06-30 ENCOUNTER — Other Ambulatory Visit: Payer: Self-pay

## 2018-06-30 ENCOUNTER — Encounter (HOSPITAL_COMMUNITY): Payer: Self-pay | Admitting: Hematology

## 2018-06-30 VITALS — BP 131/37 | HR 88 | Temp 96.8°F | Resp 16 | Wt 161.6 lb

## 2018-06-30 DIAGNOSIS — Z7189 Other specified counseling: Secondary | ICD-10-CM

## 2018-06-30 DIAGNOSIS — D631 Anemia in chronic kidney disease: Secondary | ICD-10-CM | POA: Diagnosis not present

## 2018-06-30 DIAGNOSIS — C9000 Multiple myeloma not having achieved remission: Secondary | ICD-10-CM | POA: Diagnosis present

## 2018-06-30 DIAGNOSIS — M545 Low back pain: Secondary | ICD-10-CM | POA: Diagnosis not present

## 2018-06-30 DIAGNOSIS — E876 Hypokalemia: Secondary | ICD-10-CM | POA: Diagnosis not present

## 2018-06-30 DIAGNOSIS — Z5111 Encounter for antineoplastic chemotherapy: Secondary | ICD-10-CM | POA: Insufficient documentation

## 2018-06-30 DIAGNOSIS — Z515 Encounter for palliative care: Secondary | ICD-10-CM

## 2018-06-30 DIAGNOSIS — Z5112 Encounter for antineoplastic immunotherapy: Secondary | ICD-10-CM | POA: Insufficient documentation

## 2018-06-30 DIAGNOSIS — M5416 Radiculopathy, lumbar region: Secondary | ICD-10-CM

## 2018-06-30 DIAGNOSIS — N189 Chronic kidney disease, unspecified: Secondary | ICD-10-CM | POA: Insufficient documentation

## 2018-06-30 LAB — COMPREHENSIVE METABOLIC PANEL
ALT: 24 U/L (ref 0–44)
AST: 24 U/L (ref 15–41)
Albumin: 4.1 g/dL (ref 3.5–5.0)
Alkaline Phosphatase: 78 U/L (ref 38–126)
Anion gap: 16 — ABNORMAL HIGH (ref 5–15)
BUN: 85 mg/dL — ABNORMAL HIGH (ref 6–20)
CO2: 21 mmol/L — ABNORMAL LOW (ref 22–32)
Calcium: 8.3 mg/dL — ABNORMAL LOW (ref 8.9–10.3)
Chloride: 97 mmol/L — ABNORMAL LOW (ref 98–111)
Creatinine, Ser: 5.77 mg/dL — ABNORMAL HIGH (ref 0.61–1.24)
GFR calc Af Amer: 11 mL/min — ABNORMAL LOW (ref >60)
GFR calc non Af Amer: 10 mL/min — ABNORMAL LOW (ref >60)
Glucose, Bld: 88 mg/dL (ref 70–99)
Potassium: 4.5 mmol/L (ref 3.5–5.1)
Sodium: 134 mmol/L — ABNORMAL LOW (ref 135–145)
Total Bilirubin: 0.8 mg/dL (ref 0.3–1.2)
Total Protein: 7 g/dL (ref 6.5–8.1)

## 2018-06-30 LAB — CBC WITH DIFFERENTIAL/PLATELET
Abs Immature Granulocytes: 0.11 10*3/uL — ABNORMAL HIGH (ref 0.00–0.07)
Basophils Absolute: 0 10*3/uL (ref 0.0–0.1)
Basophils Relative: 1 %
Eosinophils Absolute: 0 10*3/uL (ref 0.0–0.5)
Eosinophils Relative: 1 %
HCT: 24.5 % — ABNORMAL LOW (ref 39.0–52.0)
Hemoglobin: 7.9 g/dL — ABNORMAL LOW (ref 13.0–17.0)
Immature Granulocytes: 2 %
Lymphocytes Relative: 20 %
Lymphs Abs: 1.3 10*3/uL (ref 0.7–4.0)
MCH: 33.3 pg (ref 26.0–34.0)
MCHC: 32.2 g/dL (ref 30.0–36.0)
MCV: 103.4 fL — ABNORMAL HIGH (ref 80.0–100.0)
Monocytes Absolute: 1.7 10*3/uL — ABNORMAL HIGH (ref 0.1–1.0)
Monocytes Relative: 26 %
Neutro Abs: 3.4 10*3/uL (ref 1.7–7.7)
Neutrophils Relative %: 50 %
Platelets: 151 10*3/uL (ref 150–400)
RBC: 2.37 MIL/uL — ABNORMAL LOW (ref 4.22–5.81)
RDW: 18.1 % — ABNORMAL HIGH (ref 11.5–15.5)
WBC: 6.6 10*3/uL (ref 4.0–10.5)
nRBC: 0 % (ref 0.0–0.2)

## 2018-06-30 MED ORDER — BORTEZOMIB CHEMO SQ INJECTION 3.5 MG (2.5MG/ML)
1.3000 mg/m2 | Freq: Once | INTRAMUSCULAR | Status: AC
  Administered 2018-06-30: 2.5 mg via SUBCUTANEOUS
  Filled 2018-06-30: qty 1

## 2018-06-30 MED ORDER — SODIUM CHLORIDE 0.9 % IV SOLN
20.0000 mg | Freq: Once | INTRAVENOUS | Status: AC
  Administered 2018-06-30: 20 mg via INTRAVENOUS
  Filled 2018-06-30: qty 20

## 2018-06-30 MED ORDER — HEPARIN SOD (PORK) LOCK FLUSH 100 UNIT/ML IV SOLN: 500.0000 [IU] | Freq: Once | INTRAVENOUS | Status: DC | PRN

## 2018-06-30 MED ORDER — ACYCLOVIR 200 MG PO CAPS: 200.0000 mg | ORAL_CAPSULE | Freq: Two times a day (BID) | ORAL | 3 refills | Status: DC

## 2018-06-30 MED ORDER — SODIUM CHLORIDE 0.9 % IV SOLN
Freq: Once | INTRAVENOUS | Status: AC
  Administered 2018-06-30: 12:00:00 via INTRAVENOUS

## 2018-06-30 MED ORDER — OXYCODONE HCL 5 MG PO TABA: 5.0000 mg | ORAL_TABLET | Freq: Three times a day (TID) | ORAL | 0 refills | Status: DC | PRN

## 2018-06-30 MED ORDER — SODIUM CHLORIDE 0.9% FLUSH: 10.0000 mL | INTRAVENOUS | Status: DC | PRN

## 2018-06-30 MED ORDER — SODIUM CHLORIDE 0.9 % IV SOLN
500.0000 mg | Freq: Once | INTRAVENOUS | Status: AC
  Administered 2018-06-30: 500 mg via INTRAVENOUS
  Filled 2018-06-30: qty 25

## 2018-06-30 MED ORDER — PALONOSETRON HCL INJECTION 0.25 MG/5ML
0.2500 mg | Freq: Once | INTRAVENOUS | Status: AC
  Administered 2018-06-30: 0.25 mg via INTRAVENOUS
  Filled 2018-06-30: qty 5

## 2018-06-30 NOTE — Progress Notes (Signed)
I received a vm from Mellody Drown, she has questions regarding patient's care.  I was unable to reach her by phone but left a message for her to return the call at her convenience.

## 2018-06-30 NOTE — Patient Instructions (Addendum)
[place] at [facility] Discharge Instructions  You were seen today by Dr. Delton Coombes. He went over your recent lab results. You have Multiple Myeloma, a type of blood cancer. It is very important that you try to stay active. He would like you to start taking a 68m Aspirin everyday. He will send in pain medication as well as a medication to help prevent shingles. He would like you to start taking 3 fluid pills every morning. You will have you 3rd treatment today. He will see you back in 1 week for labs and follow up.   [facility].  To afford each patient quality time with our provider, please arrive at least 15 minutes before your scheduled appointment time.   If you have a lab appointment with the [facility] come in thru the  Main Entrance and check in at the main information desk  You need to re-schedule your appointment should you arrive 10 or more minutes late.  We strive to give you quality time with our providers, and arriving late affects you and other patients whose appointments are after yours.  Also, if you no show three or more times for appointments you may be dismissed from the clinic at the providers discretion.     Again, [facility]  Our hope is that these requests will decrease the amount of time that you wait before being seen by our physicians.       _____________________________________________________________  Should you have questions [facility] please contact our office at [phone] between the hours of 8:00 a.m. and 4:30 p.m.  Voicemails left after 4:00 p.m. will not be returned until the following business day.  For prescription refill requests, have your pharmacy contact our office and allow 72 hours.    Cancer Center Support Programs:   > Cancer Support  Group  2nd Tuesday of the month 1pm-2pm, Journey Room    _______________________________________________________________

## 2018-06-30 NOTE — Progress Notes (Signed)
06/30/18  Confirmed dose of cyclophosphamide today to be 500 mg flat dose. Today's scr 5.77 and CrCl 14 ml/min.  V.O. Dr Rhys Martini, PharmD

## 2018-06-30 NOTE — Patient Instructions (Signed)
[facility] Discharge Instructions for Patients Receiving Chemotherapy  Today you received the following chemotherapy agents   To help prevent nausea and vomiting after your treatment, we encourage you to take your nausea medication   If you develop nausea and vomiting that is not controlled by your nausea medication, call the clinic.   BELOW ARE SYMPTOMS THAT SHOULD BE REPORTED IMMEDIATELY:  *FEVER GREATER THAN 100.5 F  *CHILLS WITH OR WITHOUT FEVER  NAUSEA AND VOMITING THAT IS NOT CONTROLLED WITH YOUR NAUSEA MEDICATION  *UNUSUAL SHORTNESS OF BREATH  *UNUSUAL BRUISING OR BLEEDING  TENDERNESS IN MOUTH AND THROAT WITH OR WITHOUT PRESENCE OF ULCERS  *URINARY PROBLEMS  *BOWEL PROBLEMS  UNUSUAL RASH Items with * indicate a potential emergency and should be followed up as soon as possible.  Feel free to call the clinic should you have any questions or concerns. The clinic phone number is [phone].  Please show the CHEMO ALERT CARD at check-in to the Emergency Department and triage nurse.   

## 2018-06-30 NOTE — Progress Notes (Signed)
Oncology Navigator note: Patient's sister called back and said that they are having a hard time getting information from the patient as he doesn't know what cancer he has or is being treated for. I explained multiple myeloma to her and explained the chemotherapy regimen that he was getting and the side effects of the treatment. I explained the treatment schedule and why it was important to stay on that schedule.   She also stated that patient was scared to tell the nurses that his pain medication made him itch. 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   I have sent a message to Dr. Delton Coombes regarding this.    She is requesting a rollaider for him in the home because he is using hers and really benefits from using it.  I will send the order to [employer]   The caller was pleased and verbalized understanding of the information and was able to read back most of it as she was writing it all down.  She was given my contact information and asked to call me should her or patient have any questions or concerns.

## 2018-06-30 NOTE — Progress Notes (Signed)
[name], [place] [address]   CLINIC:  Medical Oncology/Hematology  PCP:  Patient, No Pcp Per No address on file None   REASON FOR VISIT:  Follow-up for multiple myeloma   BRIEF ONCOLOGIC HISTORY:    Kappa light chain myeloma ([place])   06/16/2018 Initial Diagnosis    Kappa light chain myeloma ([place])    06/17/2018 -  Chemotherapy    The patient had palonosetron (ALOXI) injection 0.25 mg, 0.25 mg, Intravenous,  Once, 3 of 4 cycles Administration: 0.25 mg (06/17/2018), 0.25 mg (06/23/2018), 0.25 mg (06/30/2018) bortezomib SQ (VELCADE) chemo injection 2.5 mg, 1.3 mg/m2 = 2.5 mg, Subcutaneous,  Once, 3 of 4 cycles Administration: 2.5 mg (06/17/2018), 2.5 mg (06/23/2018), 2.5 mg (06/30/2018) cyclophosphamide (CYTOXAN) 300 mg in sodium chloride 0.9 % 250 mL chemo infusion, 300 mg (100 % of original dose 300 mg), Intravenous,  Once, 3 of 4 cycles Dose modification: 300 mg (original dose 300 mg, Cycle 1, Reason: Change in SCr/CrCl), 500 mg (original dose 500 mg, Cycle 2, Reason: Provider Judgment), 500 mg (original dose 500 mg, Cycle 3, Reason: Change in SCr/CrCl) Administration: 300 mg (06/17/2018), 500 mg (06/23/2018), 500 mg (06/30/2018)  for chemotherapy treatment.       CANCER STAGING: Cancer Staging No matching staging information was found for the patient.   INTERVAL HISTORY:  [name] 58 y.o. male returns for routine follow-up and consideration for next cycle of chemotherapy. He is here today alone. He states that still has constipation. He complains with continuing back and right side pain. He still has foot and ankle swelling, takes fluid pills twice a day as prescribed. He states that he has rash all over from the tylenol in his pain medication. He states that he urinates maybe 2 times a day. Denies any vomiting, or diarrhea. Denies any new pains. Had not noticed any recent bleeding such as epistaxis, hematuria or hematochezia. Denies recent chest  pain on exertion, shortness of breath on minimal exertion, pre-syncopal episodes, or palpitations. Denies any numbness or tingling in hands . Denies any recent fevers, infections, or recent hospitalizations. Patient reports appetite at 50% and energy level at 0%.    REVIEW OF SYSTEMS:  Review of Systems  Constitutional: Positive for fatigue.  Cardiovascular: Positive for leg swelling.  Gastrointestinal: Positive for constipation and nausea.  Musculoskeletal: Positive for back pain and flank pain.  Skin: Positive for itching and rash.  Neurological: Positive for numbness.  Psychiatric/Behavioral: Positive for confusion, depression and sleep disturbance. The patient is nervous/anxious.      PAST MEDICAL/SURGICAL HISTORY:  Past Medical History:  Diagnosis Date  . Allergy   . Arthritis    neck and back  . Blood transfusion without reported diagnosis   . BPH (benign prostatic hyperplasia)   . Cancer [facility]) 2004   testicle  . Chronic kidney disease    kidney stones  . Left lumbar radiculopathy 06/12/2016  . 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 Medical history non-contributory    Pt has scattered thoughts and uncertain of past medical history  . 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 Stones, urinary tract   . Substance abuse ([place])    prescribed oxydcodone- 10 years   Past Surgical History:  Procedure Laterality Date  . BACK SURGERY     x5  . CERVICAL DISC SURGERY     x2  . COLONOSCOPY WITH PROPOFOL N/A 08/11/2016   Procedure: COLONOSCOPY WITH PROPOFOL;  Surgeon: [name], MD;  Location: [facility];  Service: Endoscopy;  Laterality: N/A;  130   . POLYPECTOMY  08/11/2016   Procedure: POLYPECTOMY;  Surgeon: [name], MD;  Location: [facility];  Service: Endoscopy;;  colon  . testicular cancer  2004  . TONSILLECTOMY       SOCIAL HISTORY:  Social History   Socioeconomic History  . Marital status: Single    Spouse name: Not on file  . Number of children: 1  .  Years of education: 44  . Highest education level: Not on file  Occupational History  . Occupation: retired    Comment: Psychologist, prison and probation services  . Occupation: disabled  Social Needs  . Financial resource strain: Not on file  . Food insecurity:    Worry: Not on file    Inability: Not on file  . Transportation needs:    Medical: Not on file    Non-medical: Not on file  Tobacco Use  . Smoking status: Current Every Day Smoker    Packs/day: 1.00    Years: 40.00    Pack years: 40.00    Types: Cigarettes    Start date: 03/31/1974  . Smokeless tobacco: Never Used  Substance and Sexual Activity  . Alcohol use: Yes    Comment: Occasional  . Drug use: No    Types: Cocaine    Comment: Remote hx of cocaine, quit 1989  . Sexual activity: Yes  Lifestyle  . Physical activity:    Days per week: Not on file    Minutes per session: Not on file  . Stress: Not on file  Relationships  . Social connections:    Talks on phone: Not on file    Gets together: Not on file    Attends religious service: Not on file    Active member of club or organization: Not on file    Attends meetings of clubs or organizations: Not on file    Relationship status: Not on file  . Intimate partner violence:    Fear of current or ex partner: Not on file    Emotionally abused: Not on file    Physically abused: Not on file    Forced sexual activity: Not on file  Other Topics Concern  . Not on file  Social History Narrative   [employer] for 12 years   [employer] for 47 years      Never married   One daughter      Lives alone   Retail banker   Right-handed   Occasional caffeine use          FAMILY HISTORY:  Family History  Problem Relation Age of Onset  . COPD Mother   . Heart disease Mother   . Other Father        Never knew his father.  . Thyroid disease Sister   . Arthritis Sister   . Heart disease Sister        bypass  . Colon cancer Neg Hx     CURRENT MEDICATIONS:  Outpatient  Encounter Medications as of 06/30/2018  Medication Sig  . acyclovir (ZOVIRAX) 200 MG capsule Take 1 capsule (200 mg total) by mouth 2 (two) times daily.  . bortezomib IV (VELCADE) 3.5 MG injection Inject 1.3 mg/m2 into the vein once a week.   . CYCLOPHOSPHAMIDE IV Inject into the vein once a week.  . feeding supplement, ENSURE ENLIVE, (ENSURE ENLIVE) LIQD Take 237 mLs by mouth 2 (two) times daily between  meals.  . lidocaine (LIDODERM) 5 % Place 1 patch onto the skin daily. Remove & Discard patch within 12 hours or as directed by MD (Patient not taking: Reported on 06/12/2018)  . OxyCODONE HCl, Abuse Deter, (OXAYDO) 5 MG TABA Take 5 mg by mouth every 8 (eight) hours as needed.  Marland Kitchen oxyCODONE-acetaminophen (PERCOCET) 7.5-325 MG tablet Take 1-2 tablets by mouth every 4 (four) hours as needed for moderate pain or severe pain.  Marland Kitchen senna-docusate (SENOKOT-S) 8.6-50 MG tablet Take 2 tablets by mouth 2 (two) times daily for 30 days.  Marland Kitchen torsemide (DEMADEX) 20 MG tablet Take 2 tablets (40 mg total) by mouth daily for 30 days.  . traMADol (ULTRAM) 50 MG tablet Take 1 tablet (50 mg total) by mouth every 6 (six) hours as needed. (Patient not taking: Reported on 06/30/2018)   No facility-administered encounter medications on file as of 06/30/2018.     ALLERGIES:  Allergies  Allergen Reactions  . Acetaminophen Nausea Only and Other (See Comments)    Elevated liver enzymes  . Cymbalta [Duloxetine Hcl] Swelling    Facial swelling  . Diclofenac Sodium Swelling       . Imodium [Loperamide] Swelling    facial  . Lyrica [Pregabalin] Swelling  . Morphine Other (See Comments)    Bradycardia   . Vioxx [Rofecoxib] Swelling  . Aleve [Naproxen] Swelling    eye     PHYSICAL EXAM:  ECOG Performance status: 1  Vitals:   06/30/18 0908  BP: (!) 131/37  Pulse: 88  Resp: 16  Temp: (!) 96.8 F (36 C)  SpO2: 98%   Filed Weights   06/30/18 0908  Weight: 161 lb 9.6 oz (73.3 kg)    Physical  Exam Constitutional:      Appearance: Normal appearance.  Cardiovascular:     Rate and Rhythm: Normal rate and regular rhythm.     Heart sounds: Normal heart sounds.  Pulmonary:     Effort: Pulmonary effort is normal.     Breath sounds: Normal breath sounds.  Abdominal:     General: Bowel sounds are normal. There is no distension.     Palpations: Abdomen is soft.     Tenderness: There is no abdominal tenderness.  Musculoskeletal:     Right lower leg: Edema present.     Left lower leg: Edema present.  Skin:    General: Skin is warm.  Neurological:     General: No focal deficit present.     Mental Status: He is alert and oriented to person, place, and time.  Psychiatric:        Mood and Affect: Mood normal.        Behavior: Behavior normal.      LABORATORY DATA:  I have reviewed the labs as listed.  CBC    Component Value Date/Time   WBC 6.6 06/30/2018 0823   RBC 2.37 (L) 06/30/2018 0823   HGB 7.9 (L) 06/30/2018 0823   HCT 24.5 (L) 06/30/2018 0823   PLT 151 06/30/2018 0823   MCV 103.4 (H) 06/30/2018 0823   MCH 33.3 06/30/2018 0823   MCHC 32.2 06/30/2018 0823   RDW 18.1 (H) 06/30/2018 0823   LYMPHSABS 1.3 06/30/2018 0823   MONOABS 1.7 (H) 06/30/2018 0823   EOSABS 0.0 06/30/2018 0823   BASOSABS 0.0 06/30/2018 0823   CMP Latest Ref Rng & Units 06/30/2018 06/23/2018 06/22/2018  Glucose 70 - 99 mg/dL 88 79 84  BUN 6 - 20 mg/dL 85(H) 79(H) 76(H)  Creatinine  0.61 - 1.24 mg/dL 5.77(H) 5.12(H) 4.78(H)  Sodium 135 - 145 mmol/L 134(L) 136 138  Potassium 3.5 - 5.1 mmol/L 4.5 5.0 5.2(H)  Chloride 98 - 111 mmol/L 97(L) 107 105  CO2 22 - 32 mmol/L 21(L) 18(L) 21(L)  Calcium 8.9 - 10.3 mg/dL 8.3(L) 9.6 9.9  Total Protein 6.5 - 8.1 g/dL 7.0 6.2(L) -  Total Bilirubin 0.3 - 1.2 mg/dL 0.8 0.6 -  Alkaline Phos 38 - 126 U/L 78 82 -  AST 15 - 41 U/L 24 30 -  ALT 0 - 44 U/L 24 25 -       DIAGNOSTIC IMAGING:  I have independently reviewed the scans and discussed with the  patient.   I have reviewed Venita Lick LPN's note and agree with the documentation.  I personally performed a face-to-face visit, made revisions and my assessment and plan is as follows.    ASSESSMENT & PLAN:   Kappa light chain myeloma ([place]) 1.  Kappa light chain myeloma: - Presentation with acute renal failure and hypercalcemia. -M spike of 0.4 g.  Serum immunofixation did not show any immunoglobulin heavy chain.  Beta-2 microglobulin of 32.8.  Kappa free light chains of 12,085 and free light chain ratio of 1981.  LDH of 224. - Started on weekly CyBorD on 06/17/2018. 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  Cytogenetic analysis shows complex chromosome aberrations.  Gain of 1 q. was seen.  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 minus; 17p- - Hemoglobin today 7.9.  White count and platelets are normal. -He may proceed with week 3 of therapy today.  He is tolerating it very well. -We had a prolonged discussion about his diagnosis and the prognosis.  He has high risk features on FISH panel and cytogenetics.  Goal is to get him to bone marrow transplant after induction therapy.  2.  Hypercalcemia: - Status post denosumab on 06/17/2018.  He was also treated with calcitonin twice daily while inpatient. -Calcium today is normal.  3.  ID prophylaxis: -We will start him on acyclovir 200 mg twice daily based on his renal function. -He was also told to take aspirin 81 mg for thromboprophylaxis.  4.  Low back pain: - He had maculopapular rash on the back after taking Percocet.  He reports that he is allergic to Tylenol. - I have given him prescription for oxycodone 5 mg to be taken every 8 hours as needed.  5.  Lower extremity edema: -He is currently taking torsemide 40 mg in the morning. -He still has significant lower extremity swelling.  I will increase torsemide to 60 mg daily.   Total time spent is 40 minutes with more than 50% of the time spent face-to-face  discussing diagnosis, prognosis, treatment options and coordination of care.  Orders placed this encounter:  No orders of the defined types were placed in this encounter.     Derek Jack, MD [place] [phone]

## 2018-06-30 NOTE — Assessment & Plan Note (Addendum)
1.  Kappa light chain myeloma: - Presentation with acute renal failure and hypercalcemia. -M spike of 0.4 g.  Serum immunofixation did not show any immunoglobulin heavy chain.  Beta-2 microglobulin of 32.8.  Kappa free light chains of 12,085 and free light chain ratio of 1981.  LDH of 224. - Started on weekly CyBorD on 06/17/2018. 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  Cytogenetic analysis shows complex chromosome aberrations.  Gain of 1 q. was seen.  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 minus; 17p- - Hemoglobin today 7.9.  White count and platelets are normal. -He may proceed with week 3 of therapy today.  He is tolerating it very well. -We had a prolonged discussion about his diagnosis and the prognosis.  He has high risk features on FISH panel and cytogenetics.  Goal is to get him to bone marrow transplant after induction therapy.  2.  Hypercalcemia: - Status post denosumab on 06/17/2018.  He was also treated with calcitonin twice daily while inpatient. -Calcium today is normal.  3.  ID prophylaxis: -We will start him on acyclovir 200 mg twice daily based on his renal function. -He was also told to take aspirin 81 mg for thromboprophylaxis.  4.  Low back pain: - He had maculopapular rash on the back after taking Percocet.  He reports that he is allergic to Tylenol. - I have given him prescription for oxycodone 5 mg to be taken every 8 hours as needed.  5.  Lower extremity edema: -He is currently taking torsemide 40 mg in the morning. -He still has significant lower extremity swelling.  I will increase torsemide to 60 mg daily.

## 2018-06-30 NOTE — Progress Notes (Signed)
Pt seen by Dr. Delton Coombes today. Labs reviewed and MD aware of BUN 85, Creatinine 5.77, Hgb 7.9. Proceed with treatment per MD.  discontinued   Treatment given today per MD orders. Tolerated infusion without adverse affects. Vital signs stable. No complaints at this time. Discharged from clinic ambulatory. F/U with [facility] as scheduled.

## 2018-07-02 ENCOUNTER — Other Ambulatory Visit (HOSPITAL_COMMUNITY): Payer: Self-pay | Admitting: *Deleted

## 2018-07-02 MED ORDER — MISC. DEVICES MISC: 99 refills | Status: DC

## 2018-07-05 ENCOUNTER — Other Ambulatory Visit (HOSPITAL_COMMUNITY): Payer: Self-pay | Admitting: *Deleted

## 2018-07-05 NOTE — Telephone Encounter (Signed)
Patient's niece called stating that the prescription for his rollaider didn't get to the pharmacy. I have called Psychiatrist to verify. They confirm that they did receive the prescription and are working to get it approved through the insurance.  Mrs Beagley is aware.

## 2018-07-07 ENCOUNTER — Other Ambulatory Visit: Payer: Self-pay

## 2018-07-07 ENCOUNTER — Inpatient Hospital Stay (HOSPITAL_COMMUNITY): Payer: Medicare Other

## 2018-07-07 ENCOUNTER — Encounter (HOSPITAL_COMMUNITY): Payer: Self-pay | Admitting: Hematology

## 2018-07-07 ENCOUNTER — Inpatient Hospital Stay (HOSPITAL_BASED_OUTPATIENT_CLINIC_OR_DEPARTMENT_OTHER): Payer: Medicare Other | Admitting: Hematology

## 2018-07-07 VITALS — BP 101/58 | HR 106 | Temp 98.7°F | Resp 16 | Wt 158.9 lb

## 2018-07-07 VITALS — BP 104/50 | HR 85 | Temp 98.3°F | Resp 18

## 2018-07-07 DIAGNOSIS — M545 Low back pain: Secondary | ICD-10-CM | POA: Diagnosis not present

## 2018-07-07 DIAGNOSIS — C9000 Multiple myeloma not having achieved remission: Secondary | ICD-10-CM | POA: Diagnosis not present

## 2018-07-07 DIAGNOSIS — Z5112 Encounter for antineoplastic immunotherapy: Secondary | ICD-10-CM | POA: Diagnosis not present

## 2018-07-07 LAB — COMPREHENSIVE METABOLIC PANEL
ALT: 17 U/L (ref 0–44)
AST: 20 U/L (ref 15–41)
Albumin: 4 g/dL (ref 3.5–5.0)
Alkaline Phosphatase: 63 U/L (ref 38–126)
Anion gap: 15 (ref 5–15)
BUN: 84 mg/dL — ABNORMAL HIGH (ref 6–20)
CO2: 21 mmol/L — ABNORMAL LOW (ref 22–32)
Calcium: 7.5 mg/dL — ABNORMAL LOW (ref 8.9–10.3)
Chloride: 101 mmol/L (ref 98–111)
Creatinine, Ser: 5.41 mg/dL — ABNORMAL HIGH (ref 0.61–1.24)
GFR calc Af Amer: 12 mL/min — ABNORMAL LOW (ref >60)
GFR calc non Af Amer: 11 mL/min — ABNORMAL LOW (ref >60)
Glucose, Bld: 129 mg/dL — ABNORMAL HIGH (ref 70–99)
Potassium: 4.4 mmol/L (ref 3.5–5.1)
Sodium: 137 mmol/L (ref 135–145)
Total Bilirubin: 0.5 mg/dL (ref 0.3–1.2)
Total Protein: 7.1 g/dL (ref 6.5–8.1)

## 2018-07-07 LAB — CBC WITH DIFFERENTIAL/PLATELET
Abs Immature Granulocytes: 0.04 10*3/uL (ref 0.00–0.07)
Basophils Absolute: 0 10*3/uL (ref 0.0–0.1)
Basophils Relative: 0 %
Eosinophils Absolute: 0.2 10*3/uL (ref 0.0–0.5)
Eosinophils Relative: 4 %
HCT: 23.1 % — ABNORMAL LOW (ref 39.0–52.0)
Hemoglobin: 7.5 g/dL — ABNORMAL LOW (ref 13.0–17.0)
Immature Granulocytes: 1 %
Lymphocytes Relative: 14 %
Lymphs Abs: 0.7 10*3/uL (ref 0.7–4.0)
MCH: 34.1 pg — ABNORMAL HIGH (ref 26.0–34.0)
MCHC: 32.5 g/dL (ref 30.0–36.0)
MCV: 105 fL — ABNORMAL HIGH (ref 80.0–100.0)
Monocytes Absolute: 0.9 10*3/uL (ref 0.1–1.0)
Monocytes Relative: 18 %
Neutro Abs: 3.1 10*3/uL (ref 1.7–7.7)
Neutrophils Relative %: 63 %
Platelets: 167 10*3/uL (ref 150–400)
RBC: 2.2 MIL/uL — ABNORMAL LOW (ref 4.22–5.81)
RDW: 18.3 % — ABNORMAL HIGH (ref 11.5–15.5)
WBC: 4.8 10*3/uL (ref 4.0–10.5)
nRBC: 0 % (ref 0.0–0.2)

## 2018-07-07 MED ORDER — PALONOSETRON HCL INJECTION 0.25 MG/5ML
0.2500 mg | Freq: Once | INTRAVENOUS | Status: AC
  Administered 2018-07-07: 0.25 mg via INTRAVENOUS
  Filled 2018-07-07: qty 5

## 2018-07-07 MED ORDER — SODIUM CHLORIDE 0.9% FLUSH: 3.0000 mL | INTRAVENOUS | Status: DC | PRN

## 2018-07-07 MED ORDER — HEPARIN SOD (PORK) LOCK FLUSH 100 UNIT/ML IV SOLN: 500.0000 [IU] | Freq: Once | INTRAVENOUS | Status: DC | PRN

## 2018-07-07 MED ORDER — SODIUM CHLORIDE 0.9% FLUSH: 10.0000 mL | INTRAVENOUS | Status: DC | PRN

## 2018-07-07 MED ORDER — SODIUM CHLORIDE 0.9 % IV SOLN
300.0000 mg/m2 | Freq: Once | INTRAVENOUS | Status: AC
  Administered 2018-07-07: 600 mg via INTRAVENOUS
  Filled 2018-07-07: qty 30

## 2018-07-07 MED ORDER — HEPARIN SOD (PORK) LOCK FLUSH 100 UNIT/ML IV SOLN: 250.0000 [IU] | Freq: Once | INTRAVENOUS | Status: DC | PRN

## 2018-07-07 MED ORDER — BORTEZOMIB CHEMO SQ INJECTION 3.5 MG (2.5MG/ML)
1.3000 mg/m2 | Freq: Once | INTRAMUSCULAR | Status: AC
  Administered 2018-07-07: 2.5 mg via SUBCUTANEOUS
  Filled 2018-07-07: qty 1

## 2018-07-07 MED ORDER — ALTEPLASE 2 MG IJ SOLR: 2.0000 mg | Freq: Once | INTRAMUSCULAR | Status: DC | PRN

## 2018-07-07 MED ORDER — SODIUM CHLORIDE 0.9 % IV SOLN
20.0000 mg | Freq: Once | INTRAVENOUS | Status: AC
  Administered 2018-07-07: 20 mg via INTRAVENOUS
  Filled 2018-07-07: qty 20

## 2018-07-07 MED ORDER — SODIUM CHLORIDE 0.9 % IV SOLN
Freq: Once | INTRAVENOUS | Status: AC
  Administered 2018-07-07: 11:00:00 via INTRAVENOUS

## 2018-07-07 NOTE — Patient Instructions (Addendum)
[place] [facility] Discharge Instructions  You were seen today by Dr. Delton Coombes.  He talked with you about your recent lab work, it is looking better. Your kidney functions are better.  Still need to see the nephrologist next week.  He talked to you about taking stool softeners to help with constipation. He also wants you to start taking the lactulose that we sent in to pharmacy.  You will take it at night and it will help your bowel movements stay soft.   He wants you to stop taking the shingles pill (Acyclovir).  Lets see if this is what is making you itch.   Try not to nap during the day so that it can help you sleep at night.    We will see you again in 1 week with next treatment.    Thank you for choosing [facility] to provide your oncology and hematology care.  To afford each patient quality time with our provider, please arrive at least 15 minutes before your scheduled appointment time.   If you have a lab appointment with the [facility] please come in thru the  Main Entrance and check in at the main information desk  You need to re-schedule your appointment should you arrive 10 or more minutes late.  We strive to give you quality time with our providers, and arriving late affects you and other patients whose appointments are after yours.  Also, if you no show three or more times for appointments you may be dismissed from the clinic at the providers discretion.     Again, thank you for choosing [facility].  Our hope is that these requests will decrease the amount of time that you wait before being seen by our physicians.       _____________________________________________________________  Should you have questions after your visit to [facility], please contact our office at [phone] between the hours of 8:00 a.m. and 4:30 p.m.  Voicemails left after 4:00 p.m. will not be returned  until the following business day.  For prescription refill requests, have your pharmacy contact our office and allow 72 hours.    Cancer Center Support Programs:   > Cancer Support Group  2nd Tuesday of the month 1pm-2pm, Journey Room

## 2018-07-07 NOTE — Progress Notes (Signed)
1045 CBCD and CMET results reviewed with and pt seen by Dr. Delton Coombes and pt approved for chemo tx today per MD                                                                     [name] tolerated Cytoxan infusion and Velcade injection well without complaints or incident. Peripheral IV site checked by 2 RN's with positive blood return noted prior to and after infusion. VSS upon discharge. Pt discharged via wheelchair in satisfactory condition

## 2018-07-07 NOTE — Progress Notes (Signed)
[name], [place] [address]   CLINIC:  Medical Oncology/Hematology  PCP:  Patient, No Pcp Per No address on file None   REASON FOR VISIT:  Follow-up for multiple myeloma and next treatment cycle  CURRENT THERAPY: Cytoxan/Velcade  BRIEF ONCOLOGIC HISTORY:    Kappa light chain myeloma ([place])   06/16/2018 Initial Diagnosis    Kappa light chain myeloma ([place])    06/17/2018 -  Chemotherapy    The patient had palonosetron (ALOXI) injection 0.25 mg, 0.25 mg, Intravenous,  Once, 4 of 7 cycles Administration: 0.25 mg (06/17/2018), 0.25 mg (06/23/2018), 0.25 mg (06/30/2018), 0.25 mg (07/07/2018) bortezomib SQ (VELCADE) chemo injection 2.5 mg, 1.3 mg/m2 = 2.5 mg, Subcutaneous,  Once, 4 of 7 cycles Administration: 2.5 mg (06/17/2018), 2.5 mg (06/23/2018), 2.5 mg (06/30/2018), 2.5 mg (07/07/2018) cyclophosphamide (CYTOXAN) 300 mg in sodium chloride 0.9 % 250 mL chemo infusion, 300 mg (100 % of original dose 300 mg), Intravenous,  Once, 4 of 7 cycles Dose modification: 300 mg (original dose 300 mg, Cycle 1, Reason: Change in SCr/CrCl), 500 mg (original dose 500 mg, Cycle 2, Reason: Provider Judgment), 500 mg (original dose 500 mg, Cycle 3, Reason: Change in SCr/CrCl) Administration: 300 mg (06/17/2018), 500 mg (06/23/2018), 500 mg (06/30/2018), 600 mg (07/07/2018)  for chemotherapy treatment.       CANCER STAGING: Cancer Staging No matching staging information was found for the patient.   INTERVAL HISTORY:  [name] 58 y.o. male returns for routine follow-up and consideration for next cycle of chemotherapy. Patient states that he has been doing well.  He states that he has had some diarrhea this morning.  He is normally constipated and took a laxative.  He states that he had some abdominal bloating.  He complains of his "abdomen and kidney bothering him".  He reports severe itching.  Since changing to just oxycodone without the tylenol he feels like he is itching  more.  He feels like its coming from that or the acyclovir. He reports that he is seeing the nephrologist next Tuesday.  He states that he is having trouble sleeping and describes nightmares.  He states that he has had some numbness and tingling in his feet and states that his leg has been going numb.   He reports his energy levels at 25% and appetite 50%.   Overall, he feels ready for next cycle of chemo today.     REVIEW OF SYSTEMS:  Review of Systems  Cardiovascular: Positive for leg swelling.  Gastrointestinal: Positive for constipation.  Skin: Positive for itching.  Psychiatric/Behavioral: Positive for sleep disturbance.  All other systems reviewed and are negative.    PAST MEDICAL/SURGICAL HISTORY:  Past Medical History:  Diagnosis Date  . Allergy   . Arthritis    neck and back  . Blood transfusion without reported diagnosis   . BPH (benign prostatic hyperplasia)   . [facility]   testicle  . Chronic kidney disease    kidney stones  . Left lumbar radiculopathy 06/12/2016  . 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 Medical history non-contributory    Pt has scattered thoughts and uncertain of past medical history  . 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 Stones, urinary tract   . Substance abuse ([place])    prescribed oxydcodone- 10 years   Past Surgical History:  Procedure Laterality Date  . BACK SURGERY     x5  . CERVICAL DISC SURGERY     x2  .  COLONOSCOPY WITH PROPOFOL N/A 08/11/2016   Procedure: COLONOSCOPY WITH PROPOFOL;  Surgeon: [name], MD;  [facility];  Service: Endoscopy;  Laterality: N/A;  130   . POLYPECTOMY  08/11/2016   Procedure: POLYPECTOMY;  Surgeon: [name], MD;  Location: [facility];  Service: Endoscopy;;  colon  . testicular cancer  2004  . TONSILLECTOMY       SOCIAL HISTORY:  Social History   Socioeconomic History  . Marital status: Single    Spouse name: Not on file  . Number of children: 1  . Years of  education: 68  . Highest education level: Not on file  Occupational History  . Occupation: retired    Comment: Psychologist, prison and probation services  . Occupation: disabled  Social Needs  . Financial resource strain: Not on file  . Food insecurity:    Worry: Not on file    Inability: Not on file  . Transportation needs:    Medical: Not on file    Non-medical: Not on file  Tobacco Use  . Smoking status: Current Every Day Smoker    Packs/day: 1.00    Years: 40.00    Pack years: 40.00    Types: Cigarettes    Start date: 03/31/1974  . Smokeless tobacco: Never Used  Substance and Sexual Activity  . Alcohol use: Yes    Comment: Occasional  . Drug use: No    Types: Cocaine    Comment: Remote hx of cocaine, quit 1989  . Sexual activity: Yes  Lifestyle  . Physical activity:    Days per week: Not on file    Minutes per session: Not on file  . Stress: Not on file  Relationships  . Social connections:    Talks on phone: Not on file    Gets together: Not on file    Attends religious service: Not on file    Active member of club or organization: Not on file    Attends meetings of clubs or organizations: Not on file    Relationship status: Not on file  . Intimate partner violence:    Fear of current or ex partner: Not on file    Emotionally abused: Not on file    Physically abused: Not on file    Forced sexual activity: Not on file  Other Topics Concern  . Not on file  Social History Narrative   [employer] for 12 years   [employer] for 70 years      Never married   One daughter      Lives alone   Retail banker   Right-handed   Occasional caffeine use          FAMILY HISTORY:  Family History  Problem Relation Age of Onset  . COPD Mother   . Heart disease Mother   . Other Father        Never knew his father.  . Thyroid disease Sister   . Arthritis Sister   . Heart disease Sister        bypass  . Colon cancer Neg Hx     CURRENT MEDICATIONS:  Outpatient Encounter  Medications as of 07/07/2018  Medication Sig  . acyclovir (ZOVIRAX) 200 MG capsule Take 1 capsule (200 mg total) by mouth 2 (two) times daily.  . bortezomib IV (VELCADE) 3.5 MG injection Inject 1.3 mg/m2 into the vein once a week.   . CYCLOPHOSPHAMIDE IV Inject into the vein once a week.  Marland Kitchen  Misc. Devices MISC Please provide patient with rollaider.  Dx: multiple myeloma C90.0  . OxyCODONE HCl, Abuse Deter, (OXAYDO) 5 MG TABA Take 5 mg by mouth every 8 (eight) hours as needed.  . senna-docusate (SENOKOT-S) 8.6-50 MG tablet Take 2 tablets by mouth 2 (two) times daily for 30 days.  Marland Kitchen torsemide (DEMADEX) 20 MG tablet Take 2 tablets (40 mg total) by mouth daily for 30 days.  . feeding supplement, ENSURE ENLIVE, (ENSURE ENLIVE) LIQD Take 237 mLs by mouth 2 (two) times daily between meals. (Patient not taking: Reported on 07/07/2018)  . lidocaine (LIDODERM) 5 % Place 1 patch onto the skin daily. Remove & Discard patch within 12 hours or as directed by MD (Patient not taking: Reported on 06/12/2018)  . oxyCODONE-acetaminophen (PERCOCET) 7.5-325 MG tablet Take 1-2 tablets by mouth every 4 (four) hours as needed for moderate pain or severe pain. (Patient not taking: Reported on 07/07/2018)  . traMADol (ULTRAM) 50 MG tablet Take 1 tablet (50 mg total) by mouth every 6 (six) hours as needed. (Patient not taking: Reported on 07/07/2018)   No facility-administered encounter medications on file as of 07/07/2018.     ALLERGIES:  Allergies  Allergen Reactions  . Acetaminophen Nausea Only and Other (See Comments)    Elevated liver enzymes  . Cymbalta [Duloxetine Hcl] Swelling    Facial swelling  . Diclofenac Sodium Swelling       . Imodium [Loperamide] Swelling    facial  . Lyrica [Pregabalin] Swelling  . Morphine Other (See Comments)    Bradycardia   . Vioxx [Rofecoxib] Swelling  . Aleve [Naproxen] Swelling    eye     PHYSICAL EXAM:  ECOG Performance status: 1  Vitals:   07/07/18 0904  BP: (!) 101/58   Pulse: (!) 106  Resp: 16  Temp: 98.7 F (37.1 C)  SpO2: 100%   Filed Weights   07/07/18 0904  Weight: 158 lb 14.4 oz (72.1 kg)    Physical Exam Vitals signs reviewed.  Constitutional:      Appearance: Normal appearance.  Cardiovascular:     Rate and Rhythm: Normal rate and regular rhythm.     Heart sounds: Normal heart sounds.  Pulmonary:     Effort: Pulmonary effort is normal.     Breath sounds: Normal breath sounds.  Abdominal:     General: There is no distension.     Palpations: Abdomen is soft. There is no mass.  Musculoskeletal:        General: No swelling.  Skin:    General: Skin is warm.  Neurological:     General: No focal deficit present.     Mental Status: He is alert and oriented to person, place, and time.  Psychiatric:        Mood and Affect: Mood normal.      LABORATORY DATA:  I have reviewed the labs as listed.  CBC    Component Value Date/Time   WBC 4.8 07/07/2018 0837   RBC 2.20 (L) 07/07/2018 0837   HGB 7.5 (L) 07/07/2018 0837   HCT 23.1 (L) 07/07/2018 0837   PLT 167 07/07/2018 0837   MCV 105.0 (H) 07/07/2018 0837   MCH 34.1 (H) 07/07/2018 0837   MCHC 32.5 07/07/2018 0837   RDW 18.3 (H) 07/07/2018 0837   LYMPHSABS 0.7 07/07/2018 0837   MONOABS 0.9 07/07/2018 0837   EOSABS 0.2 07/07/2018 0837   BASOSABS 0.0 07/07/2018 0837   CMP Latest Ref Rng & Units 07/07/2018 06/30/2018 06/23/2018  Glucose 70 - 99 mg/dL 129(H) 88 79  BUN 6 - 20 mg/dL 84(H) 85(H) 79(H)  Creatinine 0.61 - 1.24 mg/dL 5.41(H) 5.77(H) 5.12(H)  Sodium 135 - 145 mmol/L 137 134(L) 136  Potassium 3.5 - 5.1 mmol/L 4.4 4.5 5.0  Chloride 98 - 111 mmol/L 101 97(L) 107  CO2 22 - 32 mmol/L 21(L) 21(L) 18(L)  Calcium 8.9 - 10.3 mg/dL 7.5(L) 8.3(L) 9.6  Total Protein 6.5 - 8.1 g/dL 7.1 7.0 6.2(L)  Total Bilirubin 0.3 - 1.2 mg/dL 0.5 0.8 0.6  Alkaline Phos 38 - 126 U/L 63 78 82  AST 15 - 41 U/L _0 ALT 0 - 44 U/L _1 DIAGNOSTIC IMAGING:  I have independently  reviewed the scans and discussed with the patient.   I have reviewed Petrona Wyeth's note and agree with the documentation.  I personally performed a face-to-face visit, made revisions and my assessment and plan is as follows.    ASSESSMENT & PLAN:   Kappa light chain myeloma ([place]) 1.  Kappa light chain myeloma: - Presentation with acute renal failure and hypercalcemia. -M spike of 0.4 g.  Serum immunofixation did not show any immunoglobulin heavy chain.  Beta-2 microglobulin of 32.8.  Kappa free light chains of 12,085 and free light chain ratio of 1981.  LDH of 224. - Started on weekly CyBorD on 06/17/2018. 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  Cytogenetic analysis shows complex chromosome aberrations.  Gain of 1 q. was seen.  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 [place]; 17p- - Week 3 of treatment was on 06/30/2018. - He is tolerating Velcade and Cytoxan very well.  I will increase Cytoxan to full dose today. -His creatinine improved to 5.41 from 5.77 at last visit.  He has an appointment to see his kidney doctor on 07/13/2018. - We will reevaluate him in 1 week.  He may proceed with week 4 today.  I plan to repeat his free light chains and SPEP next week.  Because of his high risk cytogenetics, will consider referral to transplant center.  2.  Hypercalcemia: - Status post denosumab on 06/17/2018.  He was also treated with calcitonin twice daily while inpatient. -Calcium today 7.5.  He was instructed to take calcium supplements.   3.  ID prophylaxis: -We started him on acyclovir 200 mg twice daily based on his renal function at last visit on 06/30/2018. -He is also taking aspirin 81 mg daily. -He started complaining of itching all over the body since he started acyclovir.  I have told him to hold acyclovir until he sees me next week.   4.  Low back pain: -He is taking oxycodone 5 mg every 8 hours as needed.  He does have some constipation. -He is taking stool  softener to tablets twice daily. -I will send him prescription for lactulose to be taken 30 mL at bedtime.   5.  Lower extremity edema: -I have increased his torsemide to 60 mg daily on 06/30/2018. -His leg swelling has improved.   Total time spent is 40 minutes with more than 50% of the time spent face-to-face discussing his diagnosis, prognosis, treatment side effects, adjusting his medications and coordination of care.    Orders placed this encounter:  Orders Placed This Encounter  Procedures  . Kappa/lambda light chains  . Protein electrophoresis, serum      Derek Jack, MD [place] [phone]

## 2018-07-07 NOTE — Assessment & Plan Note (Signed)
1.  Kappa light chain myeloma: - Presentation with acute renal failure and hypercalcemia. -M spike of 0.4 g.  Serum immunofixation did not show any immunoglobulin heavy chain.  Beta-2 microglobulin of 32.8.  Kappa free light chains of 12,085 and free light chain ratio of 1981.  LDH of 224. - Started on weekly CyBorD on 06/17/2018. 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  Cytogenetic analysis shows complex chromosome aberrations.  Gain of 1 q. was seen.  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 minus; 17p- - Week 3 of treatment was on 06/30/2018. - He is tolerating Velcade and Cytoxan very well.  I will increase Cytoxan to full dose today. -His creatinine improved to 5.41 from 5.77 at last visit.  He has an appointment to see his kidney doctor on 07/13/2018. - We will reevaluate him in 1 week.  He may proceed with week 4 today.  I plan to repeat his free light chains and SPEP next week.  Because of his high risk cytogenetics, will consider referral to transplant center.  2.  Hypercalcemia: - Status post denosumab on 06/17/2018.  He was also treated with calcitonin twice daily while inpatient. -Calcium today 7.5.  He was instructed to take calcium supplements.   3.  ID prophylaxis: -We started him on acyclovir 200 mg twice daily based on his renal function at last visit on 06/30/2018. -He is also taking aspirin 81 mg daily. -He started complaining of itching all over the body since he started acyclovir.  I have told him to hold acyclovir until he sees me next week.   4.  Low back pain: -He is taking oxycodone 5 mg every 8 hours as needed.  He does have some constipation. -He is taking stool softener to tablets twice daily. -I will send him prescription for lactulose to be taken 30 mL at bedtime.   5.  Lower extremity edema: -I have increased his torsemide to 60 mg daily on 06/30/2018. -His leg swelling has improved.

## 2018-07-07 NOTE — Progress Notes (Deleted)
0940 CBCD,CMET and Mag results reviewed with and pt seen by Dr.Katragadda and pt approved for chemo tx per MD and will return tomorrow for hydration per MD order.

## 2018-07-07 NOTE — Progress Notes (Signed)
07/07/18  Order clarification for cyclophosphamide please give 300 mg/m2 (600 mg) today.  T.O. Dr Rhys Martini, PharmD

## 2018-07-07 NOTE — Patient Instructions (Signed)
[facility] Discharge Instructions for Patients Receiving Chemotherapy   Beginning January 23rd 2017 lab work for the [facility] will be done in the  Main lab at [facility] on 1st floor. If you have a lab appointment with the [facility] please come in thru the  Main Entrance and check in at the main information desk   Today you received the following chemotherapy agents Cytoxan and Velcae injection. Follow-up as scheduled. Call clinic for any questions or concerns  To help prevent nausea and vomiting after your treatment, we encourage you to take your nausea medication   If you develop nausea and vomiting, or diarrhea that is not controlled by your medication, call the clinic.  The clinic phone number is [phone]. Office hours are Monday-Friday 8:30am-5:00pm.  BELOW ARE SYMPTOMS THAT SHOULD BE REPORTED IMMEDIATELY:  *FEVER GREATER THAN 101.0 F  *CHILLS WITH OR WITHOUT FEVER  NAUSEA AND VOMITING THAT IS NOT CONTROLLED WITH YOUR NAUSEA MEDICATION  *UNUSUAL SHORTNESS OF BREATH  *UNUSUAL BRUISING OR BLEEDING  TENDERNESS IN MOUTH AND THROAT WITH OR WITHOUT PRESENCE OF ULCERS  *URINARY PROBLEMS  *BOWEL PROBLEMS  UNUSUAL RASH Items with * indicate a potential emergency and should be followed up as soon as possible. If you have an emergency after office hours please contact your primary care physician or go to the nearest emergency department.  Please call the clinic during office hours if you have any questions or concerns.   You may also contact the Patient Navigator at [phone] should you have any questions or need assistance in obtaining follow up care.      Resources For Cancer Patients and their Caregivers ? American Cancer Society: Can assist with transportation, wigs, general needs, runs Look Good Feel Better.        (385)833-8116 ? Cancer Care: Provides financial assistance, online support groups, medication/co-pay assistance.   1-800-813-HOPE 4432794360) ? [place] Assists [place] cancer patients and their families through emotional , educational and financial support.  731-185-5125 ? [place] DSS Where to apply for food stamps, Medicaid and utility assistance. 5147564181 ? [facility]: Transportation to medical appointments. 475-030-9329 ? Social Security Administration: May apply for disability if have a Stage IV cancer. (551)563-4389 435-762-8806 ? [employer], Disability and Transit Services: Assists with nutrition, care and transit needs. 9798271004

## 2018-07-12 ENCOUNTER — Other Ambulatory Visit (HOSPITAL_COMMUNITY): Payer: Self-pay | Admitting: Hematology

## 2018-07-12 ENCOUNTER — Other Ambulatory Visit (HOSPITAL_COMMUNITY): Payer: Self-pay | Admitting: *Deleted

## 2018-07-12 DIAGNOSIS — M5416 Radiculopathy, lumbar region: Secondary | ICD-10-CM

## 2018-07-12 MED ORDER — DIPHENHYDRAMINE HCL 25 MG PO TABS: 25.0000 mg | ORAL_TABLET | ORAL | 0 refills | Status: DC | PRN

## 2018-07-12 MED ORDER — LACTULOSE 10 GM/15ML PO SOLN: 20.0000 g | Freq: Every day | ORAL | 1 refills | Status: DC

## 2018-07-12 MED ORDER — HYDROMORPHONE HCL 2 MG PO TABS: 1.0000 mg | ORAL_TABLET | Freq: Three times a day (TID) | ORAL | 0 refills | Status: DC | PRN

## 2018-07-14 ENCOUNTER — Encounter (HOSPITAL_COMMUNITY): Payer: Self-pay | Admitting: Hematology

## 2018-07-14 ENCOUNTER — Other Ambulatory Visit: Payer: Self-pay

## 2018-07-14 ENCOUNTER — Inpatient Hospital Stay (HOSPITAL_BASED_OUTPATIENT_CLINIC_OR_DEPARTMENT_OTHER): Payer: Medicare Other | Admitting: Hematology

## 2018-07-14 ENCOUNTER — Inpatient Hospital Stay (HOSPITAL_COMMUNITY): Payer: Medicare Other

## 2018-07-14 VITALS — BP 112/54 | HR 84 | Temp 98.5°F | Resp 18

## 2018-07-14 DIAGNOSIS — Z5112 Encounter for antineoplastic immunotherapy: Secondary | ICD-10-CM | POA: Diagnosis not present

## 2018-07-14 DIAGNOSIS — C9000 Multiple myeloma not having achieved remission: Secondary | ICD-10-CM | POA: Diagnosis not present

## 2018-07-14 DIAGNOSIS — M545 Low back pain: Secondary | ICD-10-CM | POA: Diagnosis not present

## 2018-07-14 LAB — CBC WITH DIFFERENTIAL/PLATELET
Abs Immature Granulocytes: 0.03 10*3/uL (ref 0.00–0.07)
Basophils Absolute: 0 10*3/uL (ref 0.0–0.1)
Basophils Relative: 0 %
Eosinophils Absolute: 0.3 10*3/uL (ref 0.0–0.5)
Eosinophils Relative: 8 %
HCT: 22.7 % — ABNORMAL LOW (ref 39.0–52.0)
Hemoglobin: 7.2 g/dL — ABNORMAL LOW (ref 13.0–17.0)
Immature Granulocytes: 1 %
Lymphocytes Relative: 19 %
Lymphs Abs: 0.7 10*3/uL (ref 0.7–4.0)
MCH: 34.3 pg — ABNORMAL HIGH (ref 26.0–34.0)
MCHC: 31.7 g/dL (ref 30.0–36.0)
MCV: 108.1 fL — ABNORMAL HIGH (ref 80.0–100.0)
Monocytes Absolute: 0.8 10*3/uL (ref 0.1–1.0)
Monocytes Relative: 21 %
Neutro Abs: 2 10*3/uL (ref 1.7–7.7)
Neutrophils Relative %: 51 %
Platelets: 175 10*3/uL (ref 150–400)
RBC: 2.1 MIL/uL — ABNORMAL LOW (ref 4.22–5.81)
RDW: 18.9 % — ABNORMAL HIGH (ref 11.5–15.5)
WBC: 3.9 10*3/uL — ABNORMAL LOW (ref 4.0–10.5)
nRBC: 0 % (ref 0.0–0.2)

## 2018-07-14 LAB — COMPREHENSIVE METABOLIC PANEL
ALT: 14 U/L (ref 0–44)
AST: 16 U/L (ref 15–41)
Albumin: 4 g/dL (ref 3.5–5.0)
Alkaline Phosphatase: 52 U/L (ref 38–126)
Anion gap: 14 (ref 5–15)
BUN: 65 mg/dL — ABNORMAL HIGH (ref 6–20)
CO2: 19 mmol/L — ABNORMAL LOW (ref 22–32)
Calcium: 6.6 mg/dL — ABNORMAL LOW (ref 8.9–10.3)
Chloride: 104 mmol/L (ref 98–111)
Creatinine, Ser: 5.09 mg/dL — ABNORMAL HIGH (ref 0.61–1.24)
GFR calc Af Amer: 13 mL/min — ABNORMAL LOW (ref >60)
GFR calc non Af Amer: 11 mL/min — ABNORMAL LOW (ref >60)
Glucose, Bld: 115 mg/dL — ABNORMAL HIGH (ref 70–99)
Potassium: 4.6 mmol/L (ref 3.5–5.1)
Sodium: 137 mmol/L (ref 135–145)
Total Bilirubin: 0.8 mg/dL (ref 0.3–1.2)
Total Protein: 6.8 g/dL (ref 6.5–8.1)

## 2018-07-14 LAB — PHOSPHORUS: Phosphorus: 5.2 mg/dL — ABNORMAL HIGH (ref 2.5–4.6)

## 2018-07-14 LAB — MAGNESIUM: Magnesium: 1.9 mg/dL (ref 1.7–2.4)

## 2018-07-14 MED ORDER — BORTEZOMIB CHEMO SQ INJECTION 3.5 MG (2.5MG/ML)
1.3000 mg/m2 | Freq: Once | INTRAMUSCULAR | Status: AC
  Administered 2018-07-14: 2.5 mg via SUBCUTANEOUS
  Filled 2018-07-14: qty 1

## 2018-07-14 MED ORDER — SODIUM CHLORIDE 0.9 % IV SOLN
Freq: Once | INTRAVENOUS | Status: AC
  Administered 2018-07-14: 11:00:00 via INTRAVENOUS

## 2018-07-14 MED ORDER — PALONOSETRON HCL INJECTION 0.25 MG/5ML
0.2500 mg | Freq: Once | INTRAVENOUS | Status: AC
  Administered 2018-07-14: 0.25 mg via INTRAVENOUS
  Filled 2018-07-14: qty 5

## 2018-07-14 MED ORDER — SODIUM CHLORIDE 0.9 % IV SOLN
20.0000 mg | Freq: Once | INTRAVENOUS | Status: AC
  Administered 2018-07-14: 20 mg via INTRAVENOUS
  Filled 2018-07-14: qty 20

## 2018-07-14 MED ORDER — METHYLPREDNISOLONE 4 MG PO TBPK: ORAL_TABLET | ORAL | 0 refills | Status: DC

## 2018-07-14 MED ORDER — TORSEMIDE 20 MG PO TABS: 40.0000 mg | ORAL_TABLET | Freq: Every day | ORAL | 1 refills | Status: DC

## 2018-07-14 MED ORDER — SODIUM CHLORIDE 0.9 % IV SOLN
300.0000 mg/m2 | Freq: Once | INTRAVENOUS | Status: AC
  Administered 2018-07-14: 600 mg via INTRAVENOUS
  Filled 2018-07-14: qty 30

## 2018-07-14 NOTE — Progress Notes (Signed)
[address] [place], [place] [address]   CLINIC:  Medical Oncology/Hematology  PCP:  Patient, No Pcp Per No address on file None   REASON FOR VISIT:  Follow-up for multiple myeloma   BRIEF ONCOLOGIC HISTORY:    Kappa light chain myeloma ([place])   06/16/2018 Initial Diagnosis    Kappa light chain myeloma ([place])    06/17/2018 -  Chemotherapy    The patient had palonosetron (ALOXI) injection 0.25 mg, 0.25 mg, Intravenous,  Once, 5 of 7 cycles Administration: 0.25 mg (06/17/2018), 0.25 mg (06/23/2018), 0.25 mg (06/30/2018), 0.25 mg (07/07/2018), 0.25 mg (07/14/2018) bortezomib SQ (VELCADE) chemo injection 2.5 mg, 1.3 mg/m2 = 2.5 mg, Subcutaneous,  Once, 5 of 7 cycles Administration: 2.5 mg (06/17/2018), 2.5 mg (06/23/2018), 2.5 mg (06/30/2018), 2.5 mg (07/07/2018), 2.5 mg (07/14/2018) cyclophosphamide (CYTOXAN) 300 mg in sodium chloride 0.9 % 250 mL chemo infusion, 300 mg (100 % of original dose 300 mg), Intravenous,  Once, 5 of 7 cycles Dose modification: 300 mg (original dose 300 mg, Cycle 1, Reason: Change in SCr/CrCl), 500 mg (original dose 500 mg, Cycle 2, Reason: Provider Judgment), 500 mg (original dose 500 mg, Cycle 3, Reason: Change in SCr/CrCl) Administration: 300 mg (06/17/2018), 500 mg (06/23/2018), 500 mg (06/30/2018), 600 mg (07/07/2018), 600 mg (07/14/2018)  for chemotherapy treatment.       CANCER STAGING: Cancer Staging No matching staging information was found for the patient.   INTERVAL HISTORY:  Mr. Knoll 58 y.o. male returns for routine follow-up and consideration for next cycle of chemotherapy. He is here today alone. He states that he is still in a lot of pain after taking the dilaudid. He states that he has cramping in his hands. He states that he still has some constipation Denies any nausea, vomiting, or diarrhea. Denies any new pains. Had not noticed any recent bleeding such as epistaxis, hematuria or hematochezia. Denies recent chest pain on  exertion, shortness of breath on minimal exertion, pre-syncopal episodes, or palpitations.  Denies any recent fevers, infections, or recent hospitalizations. Patient reports appetite at 75% and energy level at 0%.      REVIEW OF SYSTEMS:  Review of Systems  Gastrointestinal: Positive for constipation and nausea.  Skin: Positive for itching and rash.  Psychiatric/Behavioral: Positive for depression.     PAST MEDICAL/SURGICAL HISTORY:  Past Medical History:  Diagnosis Date  . Allergy   . Arthritis    neck and back  . Blood transfusion without reported diagnosis   . BPH (benign prostatic hyperplasia)   . Cancer [facility]) 2004   testicle  . Chronic kidney disease    kidney stones  . Left lumbar radiculopathy 06/12/2016  . 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 Medical history non-contributory    Pt has scattered thoughts and uncertain of past medical history  . 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 Stones, urinary tract   . Substance abuse ([place])    prescribed oxydcodone- 10 years   Past Surgical History:  Procedure Laterality Date  . BACK SURGERY     x5  . CERVICAL DISC SURGERY     x2  . COLONOSCOPY WITH PROPOFOL N/A 08/11/2016   Procedure: COLONOSCOPY WITH PROPOFOL;  Surgeon: [name], MD;  Location: [facility];  Service: Endoscopy;  Laterality: N/A;  130   . POLYPECTOMY  08/11/2016   Procedure: POLYPECTOMY;  Surgeon: [name], MD;  Location: [facility] ENDO SUITE;  Service: Endoscopy;;  colon  . testicular cancer  2004  . TONSILLECTOMY       SOCIAL HISTORY:  Social History   Socioeconomic History  . Marital status: Single    Spouse name: Not on file  . Number of children: 1  . Years of education: 51  . Highest education level: Not on file  Occupational History  . Occupation: retired    Comment: Psychologist, prison and probation services  . Occupation: disabled  Social Needs  . Financial resource strain: Not on file  . Food insecurity:    Worry: Not on file    Inability: Not on file  .  Transportation needs:    Medical: Not on file    Non-medical: Not on file  Tobacco Use  . Smoking status: Current Every Day Smoker    Packs/day: 1.00    Years: 40.00    Pack years: 40.00    Types: Cigarettes    Start date: 03/31/1974  . Smokeless tobacco: Never Used  Substance and Sexual Activity  . Alcohol use: Yes    Comment: Occasional  . Drug use: No    Types: Cocaine    Comment: Remote hx of cocaine, quit 1989  . Sexual activity: Yes  Lifestyle  . Physical activity:    Days per week: Not on file    Minutes per session: Not on file  . Stress: Not on file  Relationships  . Social connections:    Talks on phone: Not on file    Gets together: Not on file    Attends religious service: Not on file    Active member of club or organization: Not on file    Attends meetings of clubs or organizations: Not on file    Relationship status: Not on file  . Intimate partner violence:    Fear of current or ex partner: Not on file    Emotionally abused: Not on file    Physically abused: Not on file    Forced sexual activity: Not on file  Other Topics Concern  . Not on file  Social History Narrative   [employer] for 12 years   [employer] for 3 years      Never married   One daughter      Lives alone   Retail banker   Right-handed   Occasional caffeine use          FAMILY HISTORY:  Family History  Problem Relation Age of Onset  . COPD Mother   . Heart disease Mother   . Other Father        Never knew his father.  . Thyroid disease Sister   . Arthritis Sister   . Heart disease Sister        bypass  . Colon cancer Neg Hx     CURRENT MEDICATIONS:  Outpatient Encounter Medications as of 07/14/2018  Medication Sig  . acyclovir (ZOVIRAX) 200 MG capsule Take 1 capsule (200 mg total) by mouth 2 (two) times daily. (Patient not taking: Reported on 07/14/2018)  . bortezomib IV (VELCADE) 3.5 MG injection Inject 1.3 mg/m2 into the vein once a week.   .  CYCLOPHOSPHAMIDE IV Inject into the vein once a week.  . diphenhydrAMINE (BENADRYL) 25 MG tablet Take 1 tablet (25 mg total) by mouth as needed for itching.  . feeding supplement, ENSURE ENLIVE, (ENSURE ENLIVE) LIQD Take 237 mLs by mouth 2 (two) times daily between meals. (Patient not taking: Reported on 07/07/2018)  . HYDROmorphone (DILAUDID) 2 MG tablet Take 0.5 tablets (1  mg total) by mouth every 8 (eight) hours as needed for severe pain.  Marland Kitchen lactulose (CHRONULAC) 10 GM/15ML solution Take 30 mLs (20 g total) by mouth at bedtime. (Patient not taking: Reported on 07/14/2018)  . lidocaine (LIDODERM) 5 % Place 1 patch onto the skin daily. Remove & Discard patch within 12 hours or as directed by MD (Patient not taking: Reported on 06/12/2018)  . methylPREDNISolone (MEDROL DOSEPAK) 4 MG TBPK tablet Take as directed  . Misc. Devices MISC Please provide patient with rollaider.  Dx: multiple myeloma C90.0  . senna-docusate (SENOKOT-S) 8.6-50 MG tablet Take 2 tablets by mouth 2 (two) times daily for 30 days.  Marland Kitchen torsemide (DEMADEX) 20 MG tablet Take 2 tablets (40 mg total) by mouth daily for 30 days.  . [DISCONTINUED] torsemide (DEMADEX) 20 MG tablet Take 2 tablets (40 mg total) by mouth daily for 30 days.   No facility-administered encounter medications on file as of 07/14/2018.     ALLERGIES:  Allergies  Allergen Reactions  . Acetaminophen Nausea Only and Other (See Comments)    Elevated liver enzymes  . Cymbalta [Duloxetine Hcl] Swelling    Facial swelling  . Diclofenac Sodium Swelling       . Imodium [Loperamide] Swelling    facial  . Lyrica [Pregabalin] Swelling  . Morphine Other (See Comments)    Bradycardia   . Vioxx [Rofecoxib] Swelling  . Aleve [Naproxen] Swelling    eye     PHYSICAL EXAM:  ECOG Performance status: 1  Vitals:   07/14/18 0831  BP: (!) 98/57  Pulse: 97  Resp: 18  Temp: 98.6 F (37 C)  SpO2: 97%   Filed Weights   07/14/18 0833  Weight: 157 lb 8 oz (71.4 kg)     Physical Exam Vitals signs reviewed.  Constitutional:      Appearance: Normal appearance.  Cardiovascular:     Rate and Rhythm: Normal rate and regular rhythm.     Heart sounds: Normal heart sounds.  Pulmonary:     Effort: Pulmonary effort is normal.     Breath sounds: Normal breath sounds.  Abdominal:     General: There is no distension.     Palpations: Abdomen is soft. There is no mass.  Musculoskeletal:        General: No swelling.  Skin:    General: Skin is warm.  Neurological:     General: No focal deficit present.     Mental Status: He is alert and oriented to person, place, and time.  Psychiatric:        Mood and Affect: Mood normal.        Behavior: Behavior normal.      LABORATORY DATA:  I have reviewed the labs as listed.  CBC    Component Value Date/Time   WBC 3.9 (L) 07/14/2018 0802   RBC 2.10 (L) 07/14/2018 0802   HGB 7.2 (L) 07/14/2018 0802   HCT 22.7 (L) 07/14/2018 0802   PLT 175 07/14/2018 0802   MCV 108.1 (H) 07/14/2018 0802   MCH 34.3 (H) 07/14/2018 0802   MCHC 31.7 07/14/2018 0802   RDW 18.9 (H) 07/14/2018 0802   LYMPHSABS 0.7 07/14/2018 0802   MONOABS 0.8 07/14/2018 0802   EOSABS 0.3 07/14/2018 0802   BASOSABS 0.0 07/14/2018 0802   CMP Latest Ref Rng & Units 07/14/2018 07/07/2018 06/30/2018  Glucose 70 - 99 mg/dL 115(H) 129(H) 88  BUN 6 - 20 mg/dL 65(H) 84(H) 85(H)  Creatinine 0.61 - 1.24 mg/dL  5.09(H) 5.41(H) 5.77(H)  Sodium 135 - 145 mmol/L 137 137 134(L)  Potassium 3.5 - 5.1 mmol/L 4.6 4.4 4.5  Chloride 98 - 111 mmol/L 104 101 97(L)  CO2 22 - 32 mmol/L 19(L) 21(L) 21(L)  Calcium 8.9 - 10.3 mg/dL 6.6(L) 7.5(L) 8.3(L)  Total Protein 6.5 - 8.1 g/dL 6.8 7.1 7.0  Total Bilirubin 0.3 - 1.2 mg/dL 0.8 0.5 0.8  Alkaline Phos 38 - 126 U/L 52 63 78  AST 15 - 41 U/L 16 20 24   ALT 0 - 44 U/L 14 17 24        DIAGNOSTIC IMAGING:  I have independently reviewed the scans and discussed with the patient.   I have reviewed Venita Lick LPN's  note and agree with the documentation.  I personally performed a face-to-face visit, made revisions and my assessment and plan is as follows.    ASSESSMENT & PLAN:   Kappa light chain myeloma ([place]) 1.  Kappa light chain myeloma: - Presentation with acute renal failure and hypercalcemia. -M spike of 0.4 g.  Serum immunofixation did not show any immunoglobulin heavy chain.  Beta-2 microglobulin of 32.8.  Kappa free light chains of 12,085 and free light chain ratio of 1981.  LDH of 224. - Started on weekly CyBorD on 06/17/2018. 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  Cytogenetic analysis shows complex chromosome aberrations.  Gain of 1 q. was seen.  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 minus; 17p- - Week 3 of treatment was on 06/30/2018. - He is tolerating Velcade and Cytoxan very well.  His Cytoxan was increased to full dose on 07/07/2018.  -Creatinine improved to 5.09 today. -He was seen by kidney doctor on 07/12/2018.  He will see him back in 1 month. -He is complaining of cramping in his hands.  We checked his magnesium which was within normal limits.  He will use mustard which is helping. -Today he may proceed with his Velcade and Cytoxan.  I have sent myeloma panel today which we will follow-up next week.  Because of his high risk cytogenetics, will consider referral to transplant center once disease is under control. -He has a lot of rash and itching on the upper trunk, mainly on the back.  I will give him a trial of Medrol Dosepak.   2.  Hypercalcemia: - Status post denosumab on 06/17/2018.  He was also treated with calcitonin twice daily while inpatient. -Calcium today 6.6.  He was instructed to take calcium supplements twice daily.  3.  ID prophylaxis: -We started him on acyclovir 200 mg twice daily based on his renal function at last visit on 06/30/2018. -He is also taking aspirin 81 mg daily. -As he started itching all over the body, we stopped his acyclovir at  last visit on 07/07/2018.   4.  Low back pain: -He did develop a rash with itching with oxycodone and hydrocodone. - I have started him on Dilaudid 1 mg every 8 hours.  It is not helping with pain.  I will increase it to 2 mg every 8 hours. -He will take lactulose for constipation.  5.  Lower extremity edema: -His edema has improved.  I will cut back on torsemide to 40 mg daily.        Orders placed this encounter:  No orders of the defined types were placed in this encounter.     Derek Jack, MD [place] [phone]

## 2018-07-14 NOTE — Progress Notes (Signed)
Pt presents today for appointment with Dr. Delton Coombes and treatment. VSS. Pt complains of cramping in his hands since his last treatment and itching. Upon assessment pt has a rash on his chest. Small, pin point. MAR reviewed.   Pt seen by Dr. Delton Coombes today. Labs reviewed. VS and labs within parameters for treatment. Per ATravis LPN add a magnesium blood draw.   Treatment given today per MD orders. Tolerated infusion without adverse affects. Vital signs stable. No complaints at this time. Discharged from clinic ambulatory. F/U with [facility] as scheduled.

## 2018-07-14 NOTE — Patient Instructions (Signed)
[facility] Discharge Instructions for Patients Receiving Chemotherapy  Today you received the following chemotherapy agents   To help prevent nausea and vomiting after your treatment, we encourage you to take your nausea medication   If you develop nausea and vomiting that is not controlled by your nausea medication, call the clinic.   BELOW ARE SYMPTOMS THAT SHOULD BE REPORTED IMMEDIATELY:  *FEVER GREATER THAN 100.5 F  *CHILLS WITH OR WITHOUT FEVER  NAUSEA AND VOMITING THAT IS NOT CONTROLLED WITH YOUR NAUSEA MEDICATION  *UNUSUAL SHORTNESS OF BREATH  *UNUSUAL BRUISING OR BLEEDING  TENDERNESS IN MOUTH AND THROAT WITH OR WITHOUT PRESENCE OF ULCERS  *URINARY PROBLEMS  *BOWEL PROBLEMS  UNUSUAL RASH Items with * indicate a potential emergency and should be followed up as soon as possible.  Feel free to call the clinic should you have any questions or concerns. The clinic phone number is [phone].  Please show the CHEMO ALERT CARD at check-in to the Emergency Department and triage nurse.   

## 2018-07-14 NOTE — Assessment & Plan Note (Signed)
1.  Kappa light chain myeloma: - Presentation with acute renal failure and hypercalcemia. -M spike of 0.4 g.  Serum immunofixation did not show any immunoglobulin heavy chain.  Beta-2 microglobulin of 32.8.  Kappa free light chains of 12,085 and free light chain ratio of 1981.  LDH of 224. - Started on weekly CyBorD on 06/17/2018. 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  Cytogenetic analysis shows complex chromosome aberrations.  Gain of 1 q. was seen.  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 minus; 17p- - Week 3 of treatment was on 06/30/2018. - He is tolerating Velcade and Cytoxan very well.  His Cytoxan was increased to full dose on 07/07/2018.  -Creatinine improved to 5.09 today. -He was seen by kidney doctor on 07/12/2018.  He will see him back in 1 month. -He is complaining of cramping in his hands.  We checked his magnesium which was within normal limits.  He will use mustard which is helping. -Today he may proceed with his Velcade and Cytoxan.  I have sent myeloma panel today which we will follow-up next week.  Because of his high risk cytogenetics, will consider referral to transplant center once disease is under control. -He has a lot of rash and itching on the upper trunk, mainly on the back.  I will give him a trial of Medrol Dosepak.   2.  Hypercalcemia: - Status post denosumab on 06/17/2018.  He was also treated with calcitonin twice daily while inpatient. -Calcium today 6.6.  He was instructed to take calcium supplements twice daily.  3.  ID prophylaxis: -We started him on acyclovir 200 mg twice daily based on his renal function at last visit on 06/30/2018. -He is also taking aspirin 81 mg daily. -As he started itching all over the body, we stopped his acyclovir at last visit on 07/07/2018.   4.  Low back pain: -He did develop a rash with itching with oxycodone and hydrocodone. - I have started him on Dilaudid 1 mg every 8 hours.  It is not helping with pain.  I  will increase it to 2 mg every 8 hours. -He will take lactulose for constipation.  5.  Lower extremity edema: -His edema has improved.  I will cut back on torsemide to 40 mg daily.

## 2018-07-14 NOTE — Patient Instructions (Addendum)
[place] at [facility] Discharge Instructions  You were seen today by Dr. Delton Coombes. He went over your recent results. Start taking only 40mg  (2 tablets) of your fluid pill. He will see you back in 1 week for labs, treatment and follow up.   Thank you for choosing [place] at [facility] to provide your oncology and hematology care.  To afford each patient quality time with our provider, please arrive at least 15 minutes before your scheduled appointment time.   If you have a lab appointment with the [facility] please come in thru the  Main Entrance and check in at the main information desk  You need to re-schedule your appointment should you arrive 10 or more minutes late.  We strive to give you quality time with our providers, and arriving late affects you and other patients whose appointments are after yours.  Also, if you no show three or more times for appointments you may be dismissed from the clinic at the providers discretion.     Again, thank you for choosing [facility].  Our hope is that these requests will decrease the amount of time that you wait before being seen by our physicians.       _____________________________________________________________  Should you have questions after your visit to [facility], please contact our office at [phone] between the hours of 8:00 a.m. and 4:30 p.m.  Voicemails left after 4:00 p.m. will not be returned until the following business day.  For prescription refill requests, have your pharmacy contact our office and allow 72 hours.    Cancer Center Support Programs:   > Cancer Support Group  2nd Tuesday of the month 1pm-2pm, Journey Room

## 2018-07-15 ENCOUNTER — Telehealth: Payer: Self-pay | Admitting: Internal Medicine

## 2018-07-15 ENCOUNTER — Other Ambulatory Visit: Payer: Self-pay

## 2018-07-15 ENCOUNTER — Encounter (HOSPITAL_COMMUNITY): Payer: Self-pay | Admitting: Emergency Medicine

## 2018-07-15 ENCOUNTER — Observation Stay (HOSPITAL_COMMUNITY)
Admission: EM | Admit: 2018-07-15 | Discharge: 2018-07-17 | Disposition: A | Discharge status: Home / Self Care | Payer: Medicare Other | Attending: Internal Medicine | Admitting: Internal Medicine

## 2018-07-15 DIAGNOSIS — C9000 Multiple myeloma not having achieved remission: Secondary | ICD-10-CM | POA: Diagnosis present

## 2018-07-15 DIAGNOSIS — Z79899 Other long term (current) drug therapy: Secondary | ICD-10-CM | POA: Insufficient documentation

## 2018-07-15 DIAGNOSIS — K625 Hemorrhage of anus and rectum: Secondary | ICD-10-CM | POA: Diagnosis present

## 2018-07-15 DIAGNOSIS — K922 Gastrointestinal hemorrhage, unspecified: Principal | ICD-10-CM | POA: Insufficient documentation

## 2018-07-15 DIAGNOSIS — D649 Anemia, unspecified: Secondary | ICD-10-CM | POA: Insufficient documentation

## 2018-07-15 DIAGNOSIS — R1084 Generalized abdominal pain: Secondary | ICD-10-CM

## 2018-07-15 DIAGNOSIS — Z8547 Personal history of malignant neoplasm of testis: Secondary | ICD-10-CM | POA: Diagnosis not present

## 2018-07-15 DIAGNOSIS — J449 Chronic obstructive pulmonary disease, unspecified: Secondary | ICD-10-CM | POA: Insufficient documentation

## 2018-07-15 DIAGNOSIS — N189 Chronic kidney disease, unspecified: Secondary | ICD-10-CM

## 2018-07-15 DIAGNOSIS — N185 Chronic kidney disease, stage 5: Secondary | ICD-10-CM | POA: Diagnosis not present

## 2018-07-15 DIAGNOSIS — I2510 Atherosclerotic heart disease of native coronary artery without angina pectoris: Secondary | ICD-10-CM | POA: Insufficient documentation

## 2018-07-15 DIAGNOSIS — F17210 Nicotine dependence, cigarettes, uncomplicated: Secondary | ICD-10-CM | POA: Insufficient documentation

## 2018-07-15 DIAGNOSIS — N179 Acute kidney failure, unspecified: Secondary | ICD-10-CM

## 2018-07-15 LAB — URINALYSIS, ROUTINE W REFLEX MICROSCOPIC
Bacteria, UA: NONE SEEN
Bilirubin Urine: NEGATIVE
Glucose, UA: NEGATIVE mg/dL
Ketones, ur: NEGATIVE mg/dL
Leukocytes,Ua: NEGATIVE
Nitrite: NEGATIVE
Protein, ur: NEGATIVE mg/dL
Specific Gravity, Urine: 1.006 (ref 1.005–1.030)
pH: 5 (ref 5.0–8.0)

## 2018-07-15 LAB — PROTEIN ELECTROPHORESIS, SERUM
A/G Ratio: 1.5 (ref 0.7–1.7)
Albumin ELP: 3.7 g/dL (ref 2.9–4.4)
Alpha-1-Globulin: 0.2 g/dL (ref 0.0–0.4)
Alpha-2-Globulin: 1.1 g/dL — ABNORMAL HIGH (ref 0.4–1.0)
Beta Globulin: 0.7 g/dL (ref 0.7–1.3)
Gamma Globulin: 0.5 g/dL (ref 0.4–1.8)
Globulin, Total: 2.5 g/dL (ref 2.2–3.9)
M-Spike, %: 0.2 g/dL — ABNORMAL HIGH
Total Protein ELP: 6.2 g/dL (ref 6.0–8.5)

## 2018-07-15 LAB — COMPREHENSIVE METABOLIC PANEL
ALT: 14 U/L (ref 0–44)
AST: 16 U/L (ref 15–41)
Albumin: 4.1 g/dL (ref 3.5–5.0)
Alkaline Phosphatase: 51 U/L (ref 38–126)
Anion gap: 13 (ref 5–15)
BUN: 66 mg/dL — ABNORMAL HIGH (ref 6–20)
CO2: 18 mmol/L — ABNORMAL LOW (ref 22–32)
Calcium: 6.5 mg/dL — ABNORMAL LOW (ref 8.9–10.3)
Chloride: 105 mmol/L (ref 98–111)
Creatinine, Ser: 5.16 mg/dL — ABNORMAL HIGH (ref 0.61–1.24)
GFR calc Af Amer: 13 mL/min — ABNORMAL LOW (ref >60)
GFR calc non Af Amer: 11 mL/min — ABNORMAL LOW (ref >60)
Glucose, Bld: 94 mg/dL (ref 70–99)
Potassium: 4.3 mmol/L (ref 3.5–5.1)
Sodium: 136 mmol/L (ref 135–145)
Total Bilirubin: 0.7 mg/dL (ref 0.3–1.2)
Total Protein: 7 g/dL (ref 6.5–8.1)

## 2018-07-15 LAB — CBC WITH DIFFERENTIAL/PLATELET
Abs Immature Granulocytes: 0.06 10*3/uL (ref 0.00–0.07)
Basophils Absolute: 0 10*3/uL (ref 0.0–0.1)
Basophils Relative: 0 %
Eosinophils Absolute: 0.3 10*3/uL (ref 0.0–0.5)
Eosinophils Relative: 6 %
HCT: 22 % — ABNORMAL LOW (ref 39.0–52.0)
Hemoglobin: 7.1 g/dL — ABNORMAL LOW (ref 13.0–17.0)
Immature Granulocytes: 1 %
Lymphocytes Relative: 14 %
Lymphs Abs: 0.7 10*3/uL (ref 0.7–4.0)
MCH: 34.1 pg — ABNORMAL HIGH (ref 26.0–34.0)
MCHC: 32.3 g/dL (ref 30.0–36.0)
MCV: 105.8 fL — ABNORMAL HIGH (ref 80.0–100.0)
Monocytes Absolute: 0.9 10*3/uL (ref 0.1–1.0)
Monocytes Relative: 19 %
Neutro Abs: 2.9 10*3/uL (ref 1.7–7.7)
Neutrophils Relative %: 60 %
Platelets: 184 10*3/uL (ref 150–400)
RBC: 2.08 MIL/uL — ABNORMAL LOW (ref 4.22–5.81)
RDW: 18.9 % — ABNORMAL HIGH (ref 11.5–15.5)
WBC: 4.9 10*3/uL (ref 4.0–10.5)
nRBC: 0 % (ref 0.0–0.2)

## 2018-07-15 LAB — KAPPA/LAMBDA LIGHT CHAINS
Kappa free light chain: 9425 mg/L — ABNORMAL HIGH (ref 3.3–19.4)
Kappa, lambda light chain ratio: 1273.65 — ABNORMAL HIGH (ref 0.26–1.65)
Lambda free light chains: 7.4 mg/L (ref 5.7–26.3)

## 2018-07-15 LAB — POC OCCULT BLOOD, ED: Fecal Occult Bld: NEGATIVE

## 2018-07-15 LAB — LIPASE, BLOOD: Lipase: 38 U/L (ref 11–51)

## 2018-07-15 MED ORDER — ACETAMINOPHEN 650 MG RE SUPP: 650.0000 mg | Freq: Four times a day (QID) | RECTAL | Status: DC | PRN

## 2018-07-15 MED ORDER — SODIUM CHLORIDE 0.9 % IV SOLN: 250.0000 mL | INTRAVENOUS | Status: DC | PRN

## 2018-07-15 MED ORDER — ONDANSETRON HCL 4 MG/2ML IJ SOLN: 4.0000 mg | Freq: Four times a day (QID) | INTRAMUSCULAR | Status: DC | PRN

## 2018-07-15 MED ORDER — ONDANSETRON HCL 4 MG/2ML IJ SOLN
4.0000 mg | Freq: Once | INTRAMUSCULAR | Status: AC
  Administered 2018-07-15: 21:00:00 4 mg via INTRAVENOUS
  Filled 2018-07-15: qty 2

## 2018-07-15 MED ORDER — SODIUM CHLORIDE 0.9% FLUSH
3.0000 mL | Freq: Two times a day (BID) | INTRAVENOUS | Status: DC
  Administered 2018-07-16 – 2018-07-17 (×4): 3 mL via INTRAVENOUS

## 2018-07-15 MED ORDER — ONDANSETRON HCL 4 MG PO TABS
4.0000 mg | ORAL_TABLET | Freq: Four times a day (QID) | ORAL | Status: DC | PRN
  Filled 2018-07-15: qty 1

## 2018-07-15 MED ORDER — SODIUM CHLORIDE 0.9% FLUSH: 3.0000 mL | INTRAVENOUS | Status: DC | PRN

## 2018-07-15 MED ORDER — CALCIUM GLUCONATE-NACL 1-0.675 GM/50ML-% IV SOLN
1.0000 g | Freq: Once | INTRAVENOUS | Status: AC
  Administered 2018-07-16: 1000 mg via INTRAVENOUS
  Filled 2018-07-15: qty 50

## 2018-07-15 MED ORDER — ACYCLOVIR 200 MG PO CAPS
200.0000 mg | ORAL_CAPSULE | Freq: Two times a day (BID) | ORAL | Status: DC
  Filled 2018-07-15 (×2): qty 1

## 2018-07-15 MED ORDER — HYDROMORPHONE HCL 1 MG/ML IJ SOLN
1.0000 mg | Freq: Once | INTRAMUSCULAR | Status: AC
  Administered 2018-07-15: 1 mg via INTRAVENOUS
  Filled 2018-07-15: qty 1

## 2018-07-15 MED ORDER — HYDROXYZINE HCL 25 MG PO TABS
25.0000 mg | ORAL_TABLET | Freq: Once | ORAL | Status: AC
  Administered 2018-07-15: 25 mg via ORAL
  Filled 2018-07-15: qty 1

## 2018-07-15 MED ORDER — HYDROMORPHONE HCL 2 MG PO TABS
1.0000 mg | ORAL_TABLET | Freq: Three times a day (TID) | ORAL | Status: DC | PRN
  Administered 2018-07-16 – 2018-07-17 (×3): 2 mg via ORAL
  Filled 2018-07-15 (×4): qty 1

## 2018-07-15 MED ORDER — HYDROMORPHONE HCL 2 MG PO TABS: 1.0000 mg | ORAL_TABLET | Freq: Three times a day (TID) | ORAL | Status: DC | PRN

## 2018-07-15 MED ORDER — CALCIUM CARBONATE ANTACID 500 MG PO CHEW
400.0000 mg | CHEWABLE_TABLET | Freq: Two times a day (BID) | ORAL | Status: DC
  Administered 2018-07-16 – 2018-07-17 (×3): 400 mg via ORAL
  Filled 2018-07-15 (×3): qty 2

## 2018-07-15 MED ORDER — ACETAMINOPHEN 325 MG PO TABS: 650.0000 mg | ORAL_TABLET | Freq: Four times a day (QID) | ORAL | Status: DC | PRN

## 2018-07-15 MED ORDER — SODIUM CHLORIDE 0.9 % IV SOLN
INTRAVENOUS | Status: AC
  Administered 2018-07-16: 01:00:00 via INTRAVENOUS

## 2018-07-15 NOTE — Telephone Encounter (Signed)
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 Last CT abdomen/pelvis 06/12/18 showed some wall thickening which could POSSIBLY be early diverticulitis. However, we haven't seen him in about a year. I would be hesitent to order a new CT right now because he's had several related to oncology. His hgb was low-7 range yesterday and hematology should be keeping an eye on that.   I feel it's probably best given the significant amount of pain he's in with possible need for imaging to r/o diverticulitis vs some other abdominal etiology PLUS possible need for transfusion AND the fact we haven't seen him in over a year that he should probably present to the ER.  Let me know if any questions.

## 2018-07-15 NOTE — H&P (Signed)
History and Physical    [name]:[id number] DOB: [birth year] DOA: 07/15/2018  PCP: [name], No Pcp Per   [name] coming from: Home   Chief Complaint: Rectal bleeding and right hip pain intermittently x1 mo   HPI: [name] is a 58 y.o. male with medical history significant for substance abuse, lumbar radiculopathy status post fusion, COPD, kappa light chain myeloma on chemotherapy, and chronic kidney disease stage V, now presenting to the emergency department for evaluation of intermittent rectal bleeding and right flank pain over the past month.  [name] reports that he first noticed pain at the right hip and lower back about a month ago, around the same time as when he was diagnosed with multiple myeloma.  This has been waxing and waning, improves with oral Dilaudid at home, and with no other alleviating and no exacerbating factors identified.  Over the same interval, he also reports intermittent rectal bleeding, described as red with occasional clot, usually about a teaspoon or 2 mixed with stool.  He moved his bowels twice today, both with some red blood.  He reports a generalized abdominal cramping discomfort, sometimes worse in the lower right quadrant.  Denies any fevers or chills.  Denies any change in his chronic cough, denies shortness of breath, chest pain, or palpitations.  ED Course: Upon arrival to the ED, [name] is found to be afebrile, saturating well on room air, and with stable blood pressure.  Chemistry panel is notable for bicarbonate of 18, calcium 6.5 with normal albumin, and creatinine of 5.16, similar to priors.  CBC is notable for microcytic anemia with hemoglobin 7.1.  Fecal occult blood testing is negative.  Gastroenterology was consulted by the ED physician and recommended medical admission.  Review of Systems:  All other systems reviewed and apart from HPI, are negative.  Past Medical History:  Diagnosis Date  . Allergy   . Arthritis    neck and back   . Blood transfusion without reported diagnosis   . BPH (benign prostatic hyperplasia)   . Cancer [facility]) 2004   testicle  . Chronic kidney disease    kidney stones  . Left lumbar radiculopathy 06/12/2016  . 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 Medical history non-contributory    Pt has scattered thoughts and uncertain of past medical history  . 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 Stones, urinary tract   . Substance abuse ([place])    prescribed oxydcodone- 10 years    Past Surgical History:  Procedure Laterality Date  . BACK SURGERY     x5  . CERVICAL DISC SURGERY     x2  . COLONOSCOPY WITH PROPOFOL N/A 08/11/2016   Procedure: COLONOSCOPY WITH PROPOFOL;  Surgeon: [name], MD;  Location[facility] END[facility];  Service: Endoscopy;  Laterality: N/A;  130   . POLYPECTOMY  08/11/2016   Procedure: POLYPECTOMY;  Surgeon: [name], MD;  Location: [facility] ENDO SUITE;  Service: Endoscopy;;  colon  . testicular cancer  2004  . TONSILLECTOMY       reports that he has been smoking cigarettes. He started smoking about 44 years ago. He has a 40.00 pack-year smoking history. He has never used smokeless tobacco. He reports current alcohol use. He reports that he does not use drugs.  Allergies  Allergen Reactions  . Acetaminophen Nausea Only and Other (See Comments)    Elevated liver enzymes  . Cymbalta [Duloxetine Hcl] Swelling    Facial swelling  . Diclofenac Sodium Swelling       .  Imodium [Loperamide] Swelling    facial  . Lyrica [Pregabalin] Swelling  . Morphine Other (See Comments)    Bradycardia   . Vioxx [Rofecoxib] Swelling  . Aleve [Naproxen] Swelling    eye    Family History  Problem Relation Age of Onset  . COPD Mother   . Heart disease Mother   . Other Father        Never knew his father.  . Thyroid disease Sister   . Arthritis Sister   . Heart disease Sister        bypass  . Colon cancer Neg Hx      Prior to Admission medications   Medication Sig Start Date  End Date Taking? Authorizing Provider  acyclovir (ZOVIRAX) 200 MG capsule Take 200 mg by mouth 2 (two) times daily.   Yes [provider]  bortezomib IV (VELCADE) 3.5 MG injection Inject 1.3 mg/m2 into the vein once a week.    Yes [provider]  CYCLOPHOSPHAMIDE IV Inject into the vein once a week.   Yes [provider]  HYDROmorphone (DILAUDID) 2 MG tablet Take 0.5 tablets (1 mg total) by mouth every 8 (eight) hours as needed for severe pain. [name] taking differently: Take 2 mg by mouth every 8 (eight) hours as needed for severe pain.  07/12/18  Yes Brandon Jack, MD  Misc. Devices MISC Please provide [name] with rollaider.  Dx: multiple myeloma C90.0 07/02/18  Yes Brandon Jack, MD  senna-docusate (SENOKOT-S) 8.6-50 MG tablet Take 2 tablets by mouth 2 (two) times daily for 30 days. 06/23/18 07/23/18 Yes Shah, Pratik D, DO  torsemide (DEMADEX) 20 MG tablet Take 2 tablets (40 mg total) by mouth daily for 30 days. 07/14/18 08/13/18 Yes Brandon Jack, MD  diphenhydrAMINE (BENADRYL) 25 MG tablet Take 1 tablet (25 mg total) by mouth as needed for itching. [name] not taking: Reported on 07/15/2018 07/12/18   Brandon Jack, MD  methylPREDNISolone (MEDROL DOSEPAK) 4 MG TBPK tablet Take as directed [name] not taking: Reported on 07/15/2018 07/14/18   Brandon Jack, MD    Physical Exam: Vitals:   07/15/18 2130 07/15/18 2200 07/15/18 2230 07/15/18 2300  BP: (!) 110/57 111/62 115/71 108/71  Pulse: 83 78 84 81  Resp:      Temp:      TempSrc:      SpO2: 94% 96% 96% 95%    Constitutional: NAD, calm  Eyes: PERTLA, lids and conjunctivae normal ENMT: Mucous membranes are moist. Posterior pharynx clear of any exudate or lesions.   Neck: normal, supple, no masses, no thyromegaly Respiratory: no wheezing, no crackles. Normal respiratory effort.  Cardiovascular: S1 & S2 heard, regular rate and rhythm. No extremity edema.   Abdomen: No distension,  soft, mild generalized tenderness, no rebound pain or guarding. Bowel sounds active.  Musculoskeletal: no clubbing / cyanosis. No joint deformity upper and lower extremities.    Skin: no significant rashes, lesions, ulcers. Warm, dry, well-perfused. Neurologic: CN 2-12 grossly intact. Sensation intact, DTR normal. Strength 5/5 in all 4 limbs.  Psychiatric:  Alert and oriented to person, place, and situation. Calm, cooperative.    Labs on Admission: I have personally reviewed following labs and imaging studies  CBC: Recent Labs  Lab 07/14/18 0802 07/15/18 2032  WBC 3.9* 4.9  NEUTROABS 2.0 2.9  HGB 7.2* 7.1*  HCT 22.7* 22.0*  MCV 108.1* 105.8*  PLT 175 417   Basic Metabolic Panel: Recent Labs  Lab 07/14/18 0802 07/14/18 0803 07/15/18 2032  NA  137  --  136  K 4.6  --  4.3  CL 104  --  105  CO2 19*  --  18*  GLUCOSE 115*  --  94  BUN 65*  --  66*  CREATININE 5.09*  --  5.16*  CALCIUM 6.6*  --  6.5*  MG  --  1.9  --   PHOS 5.2*  --   --    GFR: Estimated Creatinine Clearance: 15.6 mL/min (A) (by C-G formula based on SCr of 5.16 mg/dL (H)). Liver Function Tests: Recent Labs  Lab 07/14/18 0802 07/15/18 2032  AST 16 16  ALT 14 14  ALKPHOS 52 51  BILITOT 0.8 0.7  PROT 6.8 7.0  ALBUMIN 4.0 4.1   Recent Labs  Lab 07/15/18 2032  LIPASE 38   No results for input(s): AMMONIA in the last 168 hours. Coagulation Profile: No results for input(s): INR, PROTIME in the last 168 hours. Cardiac Enzymes: No results for input(s): CKTOTAL, CKMB, CKMBINDEX, TROPONINI in the last 168 hours. BNP (last 3 results) No results for input(s): PROBNP in the last 8760 hours. HbA1C: No results for input(s): HGBA1C in the last 72 hours. CBG: No results for input(s): GLUCAP in the last 168 hours. Lipid Profile: No results for input(s): CHOL, HDL, LDLCALC, TRIG, CHOLHDL, LDLDIRECT in the last 72 hours. Thyroid Function Tests: No results for input(s): TSH, T4TOTAL, FREET4, T3FREE,  THYROIDAB in the last 72 hours. Anemia Panel: No results for input(s): VITAMINB12, FOLATE, FERRITIN, TIBC, IRON, RETICCTPCT in the last 72 hours. Urine analysis:    Component Value Date/Time   COLORURINE STRAW (A) 07/15/2018 2033   APPEARANCEUR CLEAR 07/15/2018 2033   LABSPEC 1.006 07/15/2018 2033   PHURINE 5.0 07/15/2018 2033   GLUCOSEU NEGATIVE 07/15/2018 2033   HGBUR SMALL (A) 07/15/2018 2033   BILIRUBINUR NEGATIVE 07/15/2018 2033   KETONESUR NEGATIVE 07/15/2018 2033   PROTEINUR NEGATIVE 07/15/2018 2033   UROBILINOGEN 0.2 02/09/2008 2042   NITRITE NEGATIVE 07/15/2018 2033   LEUKOCYTESUR NEGATIVE 07/15/2018 2033   Sepsis Labs: _0 (procalcitonin:4,lacticidven:4) )No results found for this or any previous visit (from the past 240 hour(s)).   Radiological Exams on Admission: No results found.  EKG: Not performed.   Assessment/Plan  1. Rectal bleeding; macrocytic anemia  - Presents with blood in his stool x2 today, reporting this has been intermittent over the past month  - Hgb is 7.1 in ED, continuing a slow downtrend from the 9-range last month  - FOBT is negative in ED  - History most consistent with diverticular bleed or AVM, possibly hemorrhoidal, and less likely upper   - GI is consulting and much appreciated  - Type and screen  - Continue bowel-rest, gentle IVF hydration while NPO, repeat CBC in am   2. CKD V  - SCr is 5.16 on admission, similar to priors and attributed to myeloma  - BP is at goal, potassium wnl, bicarb slightly low, and no hypervolemia (appears hypovolemic on admission)  - Renally-dose medications, hold diuretic and provide gentle IVF hydration while NPO    3. Kappa light chain myeloma  - Diagnosed in March 2020, following with oncology, managed with chemotherapy, last infusion 07/14/18    4. Chronic pain  - Continue home-regimen with oral Dilaudid    5. Hypocalcemia   - Serum calcium is 6.5 in ED with normal albumin  - He had been  treated by oncology with denosumab and calcitonin for hypercalcemia, now advised to take to calcium supplement  -  Magnesium level wnl  - He reports some hand cramping recently and will be given 1 g IV calcium, then continue oral supplement    PPE: Mask, face shield  DVT prophylaxis: SCD's  Code Status: Full  Family Communication: Discussed with [name]  Consults called: GI consulted by ED physician  Admission status: Observation     Vianne Bulls, MD [facility] Pager [phone]  If 7PM-7AM, please contact night-coverage [email or web address] Password [facility]  07/15/2018, 11:16 PM

## 2018-07-15 NOTE — Telephone Encounter (Signed)
Communication and advice noted.  Pt could be significantly ill.  Diverticulitis but one possibility.  If report he can't stand up secondary to pain and given co-morbidities, I advise an ED visit without further discussion.  Interesting he was seen by oncology yesterday.

## 2018-07-15 NOTE — Telephone Encounter (Signed)
PATIENT CALLED AND HE IS PASSING BLOOD AND HE IS HAVING AWFUL SIDE PAIN, ALSO DIAGNOSED WITH CANCER.  [phone]  THINKS RMR HAD MENTIONED DIVERTICULITIS TO HIM IN THE PAST.  HE DOES NOT HAVE A SMART PHONE TO BE ABLE TO HAVE A VIRTUAL VISIT. SAID HIS PAIN IS SO BAD HE CAN NOT STAND UP STRAIGHT.

## 2018-07-15 NOTE — Telephone Encounter (Signed)
Spoke with pt and he is aware of the ED eval recommendation per EG and RMR.

## 2018-07-15 NOTE — ED Provider Notes (Signed)
[facility] EMERGENCY DEPARTMENT Provider Note   CSN: [id number] Arrival date & time: 07/15/18  1956    History   Chief Complaint Chief Complaint  Patient presents with  . Flank Pain  . Rectal Bleeding    HPI [name] is a 58 y.o. male with a history of multiple myeloma, currently undergoing weekly chemotherapy, chronic renal failure followed by nephology, presenting with complaint of bright red blood bleeding with bowel movements x 2 today, although endorses seeing occasional blood with bm's x the past several weeks, worsened today.  He has known history of hemorrhoids but also diverticulosis and has been on chronic pain medicine but also stool softeners to avoid straining.  He denies abdominal pain and rectal pain but endorses right flank pain which has been present since he was first diagnosed with his multiple myeloma.  He does report generalized fatigue, denies dizziness, syncope, sob, chest pain, also denies fevers or chills, no unusual bruising or other bleeding sites.     The history is provided by the patient.    Past Medical History:  Diagnosis Date  . Allergy   . Arthritis    neck and back  . Blood transfusion without reported diagnosis   . BPH (benign prostatic hyperplasia)   . Cancer [facility]) 2004   testicle  . Chronic kidney disease    kidney stones  . Left lumbar radiculopathy 06/12/2016  . 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 Medical history non-contributory    Pt has scattered thoughts and uncertain of past medical history  . 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 Stones, urinary tract   . Substance abuse ([place])    prescribed oxydcodone- 10 years    Patient Active Problem List   Diagnosis Date Noted  . Goals of care, counseling/discussion   . Palliative care encounter   . Kappa light chain myeloma ([place]) 06/16/2018  . Lumbar epidural mass ([place])   . AKI (acute kidney injury) ([place]) 06/12/2018  . PNA (pneumonia) 06/12/2018  . Hypercalcemia 06/12/2018  .  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 Pulmonary nodules 06/18/2017  . Rectal bleeding 07/09/2016  . Hemorrhoids 07/09/2016  . Chronic left-sided low back pain with left-sided sciatica 06/12/2016  . Left lumbar radiculopathy 06/12/2016  . Aortic atherosclerosis ([place]) 05/28/2016  . 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 Cervical disc disease 05/22/2016  . Kidney stones 05/22/2016  . Tobacco abuse 05/22/2016  . BPH (benign prostatic hyperplasia) 05/22/2016  . H/O: asbestos exposure 05/22/2016  . Substance abuse ([place]) 05/22/2016  . Personality disorder ([place]) 05/22/2016  . Influenza vaccine refused 05/22/2016  . Lumbar stenosis 07/24/2015  . CAROTID ARTERY STENOSIS 04/24/2010  . PVC (premature ventricular contraction) 02/25/2010    Past Surgical History:  Procedure Laterality Date  . BACK SURGERY     x5  . CERVICAL DISC SURGERY     x2  . COLONOSCOPY WITH PROPOFOL N/A 08/11/2016   Procedure: COLONOSCOPY WITH PROPOFOL;  Surgeon: [name], MD;  Location[facility] ENDO [facility];  Service: Endoscopy;  Laterality: N/A;  130   . POLYPECTOMY  08/11/2016   Procedure: POLYPECTOMY;  Surgeon: [name], MD;  Location: [facility];  Service: Endoscopy;;  colon  . testicular cancer  2004  . TONSILLECTOMY          Home Medications    Prior to Admission medications   Medication Sig Start Date End Date Taking? Authorizing Provider  acyclovir (ZOVIRAX) 200 MG capsule Take 200 mg by mouth 2 (two) times daily.  Yes [provider]  bortezomib IV (VELCADE) 3.5 MG injection Inject 1.3 mg/m2 into the vein once a week.    Yes [provider]  CYCLOPHOSPHAMIDE IV Inject into the vein once a week.   Yes [provider]  HYDROmorphone (DILAUDID) 2 MG tablet Take 0.5 tablets (1 mg total) by mouth every 8 (eight) hours as needed for severe pain. Patient taking differently: Take 2 mg by mouth every 8 (eight) hours as needed for severe pain.  07/12/18  Yes Derek Jack,  MD  Misc. Devices MISC Please provide patient with rollaider.  Dx: multiple myeloma C90.0 07/02/18  Yes Derek Jack, MD  senna-docusate (SENOKOT-S) 8.6-50 MG tablet Take 2 tablets by mouth 2 (two) times daily for 30 days. 06/23/18 07/23/18 Yes Shah, Pratik D, DO  torsemide (DEMADEX) 20 MG tablet Take 2 tablets (40 mg total) by mouth daily for 30 days. 07/14/18 08/13/18 Yes Derek Jack, MD  diphenhydrAMINE (BENADRYL) 25 MG tablet Take 1 tablet (25 mg total) by mouth as needed for itching. Patient not taking: Reported on 07/15/2018 07/12/18   Derek Jack, MD  methylPREDNISolone (MEDROL DOSEPAK) 4 MG TBPK tablet Take as directed Patient not taking: Reported on 07/15/2018 07/14/18   Derek Jack, MD    Family History Family History  Problem Relation Age of Onset  . COPD Mother   . Heart disease Mother   . Other Father        Never knew his father.  . Thyroid disease Sister   . Arthritis Sister   . Heart disease Sister        bypass  . Colon cancer Neg Hx     Social History Social History   Tobacco Use  . Smoking status: Current Every Day Smoker    Packs/day: 1.00    Years: 40.00    Pack years: 40.00    Types: Cigarettes    Start date: 03/31/1974  . Smokeless tobacco: Never Used  Substance Use Topics  . Alcohol use: Yes    Comment: Occasional  . Drug use: No    Types: Cocaine    Comment: Remote hx of cocaine, quit 1989     Allergies   Acetaminophen; Cymbalta [duloxetine hcl]; Diclofenac sodium; Imodium [loperamide]; Lyrica [pregabalin]; Morphine; Vioxx [rofecoxib]; and Aleve [naproxen]   Review of Systems Review of Systems  Constitutional: Negative for chills and fever.  HENT: Negative for congestion.   Eyes: Negative.   Respiratory: Negative for chest tightness and shortness of breath.   Cardiovascular: Negative for chest pain.  Gastrointestinal: Positive for blood in stool. Negative for abdominal pain, anal bleeding, nausea, rectal pain and  vomiting.  Genitourinary: Positive for flank pain. Negative for dysuria and hematuria.  Musculoskeletal: Negative for arthralgias, joint swelling and neck pain.  Skin: Negative.  Negative for rash and wound.  Neurological: Positive for weakness. Negative for dizziness, light-headedness, numbness and headaches.  Hematological: Does not bruise/bleed easily.  Psychiatric/Behavioral: Negative.      Physical Exam Updated Vital Signs BP 115/71   Pulse 84   Temp 98.7 F (37.1 C) (Oral)   Resp 16   SpO2 96%   Physical Exam Vitals signs and nursing note reviewed. Exam conducted with a chaperone present.  Constitutional:      General: He is not in acute distress.    Appearance: He is well-developed.  HENT:     Head: Normocephalic and atraumatic.  Eyes:     Conjunctiva/sclera:     Left eye: Left eye chemosis: pale  conjunctiva.  Neck:     Musculoskeletal: Normal range of motion.  Cardiovascular:     Rate and Rhythm: Normal rate and regular rhythm.     Heart sounds: Normal heart sounds.  Pulmonary:     Effort: Pulmonary effort is normal.     Breath sounds: Normal breath sounds. No wheezing.  Abdominal:     General: Bowel sounds are normal. There is no distension.     Palpations: Abdomen is soft.     Tenderness: There is abdominal tenderness. There is right CVA tenderness. There is no guarding or rebound.     Comments: ttp  RUQ and right flank.   Genitourinary:    Rectum: Guaiac result negative. No mass, anal fissure, external hemorrhoid or internal hemorrhoid.  Musculoskeletal: Normal range of motion.  Skin:    General: Skin is warm and dry.  Neurological:     Mental Status: He is alert.      ED Treatments / Results  Labs (all labs ordered are listed, but only abnormal results are displayed) Labs Reviewed  CBC WITH DIFFERENTIAL/PLATELET - Abnormal; Notable for the following components:      Result Value   RBC 2.08 (*)    Hemoglobin 7.1 (*)    HCT 22.0 (*)    MCV 105.8  (*)    MCH 34.1 (*)    RDW 18.9 (*)    All other components within normal limits  COMPREHENSIVE METABOLIC PANEL - Abnormal; Notable for the following components:   CO2 18 (*)    BUN 66 (*)    Creatinine, Ser 5.16 (*)    Calcium 6.5 (*)    GFR calc non Af Amer 11 (*)    GFR calc Af Amer 13 (*)    All other components within normal limits  URINALYSIS, ROUTINE W REFLEX MICROSCOPIC - Abnormal; Notable for the following components:   Color, Urine STRAW (*)    Hgb urine dipstick SMALL (*)    All other components within normal limits  LIPASE, BLOOD  POC OCCULT BLOOD, ED  TYPE AND SCREEN    EKG None  Radiology No results found.  Procedures Procedures (including critical care time)  Medications Ordered in ED Medications  ondansetron (ZOFRAN) injection 4 mg (4 mg Intravenous Given 07/15/18 2038)  HYDROmorphone (DILAUDID) injection 1 mg (1 mg Intravenous Given 07/15/18 2039)  hydrOXYzine (ATARAX/VISTARIL) tablet 25 mg (25 mg Oral Given 07/15/18 2042)     Initial Impression / Assessment and Plan / ED Course  I have reviewed the triage vital signs and the nursing notes.  Pertinent labs & imaging results that were available during my care of the patient were reviewed by me and considered in my medical decision making (see chart for details).        Pt with a history of diverticulosis with new presumptive lower GI bleed, but hemoccult negative tonight in setting of chronic anemia, hgb 7.1 tonight, renal failure felt secondary to recent diagnosis of multiple myeloma, currently undergoing chemotherapy.  Call placed to Dr. Gala Romney who will follow patient during admission.  Will plan to see in am.  Will plan admission to Hospitalists.    Discussed pt with Dr. Myna Hidalgo who accepts pt for admission.  Final Clinical Impressions(s) / ED Diagnoses   Final diagnoses:  Lower GI bleed  Anemia, unspecified type  Acute kidney injury superimposed on CKD ([place])  Multiple myeloma not having achieved  remission [facility])    ED Discharge Orders    None  Evalee Jefferson, PA-C 07/15/18 2307    Noemi Chapel, MD 07/19/18 1324

## 2018-07-15 NOTE — ED Provider Notes (Signed)
Medical screening examination/treatment/procedure(s) were conducted as a shared visit with non-physician practitioner(s) and myself.  I personally evaluated the patient during the encounter.  Clinical Impression:   Final diagnoses:  Lower GI bleed  Anemia, unspecified type  Acute kidney injury superimposed on CKD ([place])  Multiple myeloma not having achieved remission ([place])      Pt visit shared with Evalee Jefferson -  Pt has hx of MM, on chemo - has had slow drop of his Hgb and today is just over 7, has known poor kidney function - has also been seeing blood in his stools for a couple of weeks which he reports was secondary to his opiate use for his pain (now on dilaudid).  He denies vomiting and has soft abd on exam - per PA note has neg stool - no stool in vault - no hemorrhoids present.  Pt likely benefit from admission given already anemic, poor kidneys function and now GI bleeding - his BP is stable appearing.   Noemi Chapel, MD 07/19/18 1324

## 2018-07-15 NOTE — ED Triage Notes (Signed)
PC/O right sided flank pain that began 3 weeks ago. He has noticed some dysuria that started "recently." Pt also C/O bloody stools that began 2 weeks ago. Pt states he has spoken with Dr. Gala Romney and was told he has problems with hemorrhoids but if the problem persists to come to the ER for evaluation.

## 2018-07-15 NOTE — Telephone Encounter (Signed)
Number is busy. Will call pt back.

## 2018-07-15 NOTE — Telephone Encounter (Signed)
Pt with Multiple myeloma called with c/o rt side pelvic pain that gets to a 7.5. It takes a while when standing for pt to straighten up because the pain can be severe. Pt also reports bleeding in his stool about a teaspoon full off and on x 3 weeks. He does feel a little lightheaded if he gets up too fast. Pt mentioned that he has a blood transfusion 1 week ago and gets his blood checked often due to his condition. Pt doesn't feel he is straining and takes a stool softener with his other mediations so he can loosen his stool up. Pt is also currently receives chemo treatments. Pt was asked if he talked to his cancer doctor and kidney doctor about the pain and he said RMR mentioned diverticulosis or diverticulitis and he feels that's causing the pain in his abdomen/pelvic area. Pt reports no nausea or vomiting. Pt is aware that if his symptoms worsens, he needs to be evaluated.

## 2018-07-16 ENCOUNTER — Observation Stay (HOSPITAL_COMMUNITY): Payer: Medicare Other

## 2018-07-16 DIAGNOSIS — K922 Gastrointestinal hemorrhage, unspecified: Secondary | ICD-10-CM | POA: Diagnosis not present

## 2018-07-16 DIAGNOSIS — N179 Acute kidney failure, unspecified: Secondary | ICD-10-CM | POA: Diagnosis not present

## 2018-07-16 DIAGNOSIS — R1084 Generalized abdominal pain: Secondary | ICD-10-CM

## 2018-07-16 DIAGNOSIS — I2510 Atherosclerotic heart disease of native coronary artery without angina pectoris: Secondary | ICD-10-CM | POA: Diagnosis not present

## 2018-07-16 DIAGNOSIS — C9000 Multiple myeloma not having achieved remission: Secondary | ICD-10-CM | POA: Diagnosis not present

## 2018-07-16 DIAGNOSIS — F17210 Nicotine dependence, cigarettes, uncomplicated: Secondary | ICD-10-CM | POA: Diagnosis not present

## 2018-07-16 DIAGNOSIS — N189 Chronic kidney disease, unspecified: Secondary | ICD-10-CM

## 2018-07-16 DIAGNOSIS — D649 Anemia, unspecified: Secondary | ICD-10-CM | POA: Diagnosis not present

## 2018-07-16 DIAGNOSIS — K625 Hemorrhage of anus and rectum: Secondary | ICD-10-CM | POA: Diagnosis not present

## 2018-07-16 LAB — CBC
HCT: 20.6 % — ABNORMAL LOW (ref 39.0–52.0)
HCT: 24.2 % — ABNORMAL LOW (ref 39.0–52.0)
Hemoglobin: 6.6 g/dL — CL (ref 13.0–17.0)
Hemoglobin: 7.7 g/dL — ABNORMAL LOW (ref 13.0–17.0)
MCH: 34 pg (ref 26.0–34.0)
MCH: 32.1 pg (ref 26.0–34.0)
MCHC: 32 g/dL (ref 30.0–36.0)
MCHC: 31.8 g/dL (ref 30.0–36.0)
MCV: 106.2 fL — ABNORMAL HIGH (ref 80.0–100.0)
MCV: 100.8 fL — ABNORMAL HIGH (ref 80.0–100.0)
Platelets: 167 10*3/uL (ref 150–400)
Platelets: 170 10*3/uL (ref 150–400)
RBC: 1.94 MIL/uL — ABNORMAL LOW (ref 4.22–5.81)
RBC: 2.4 MIL/uL — ABNORMAL LOW (ref 4.22–5.81)
RDW: 18.9 % — ABNORMAL HIGH (ref 11.5–15.5)
RDW: 20.8 % — ABNORMAL HIGH (ref 11.5–15.5)
WBC: 4.2 10*3/uL (ref 4.0–10.5)
WBC: 3.8 10*3/uL — ABNORMAL LOW (ref 4.0–10.5)
nRBC: 0 % (ref 0.0–0.2)
nRBC: 0 % (ref 0.0–0.2)

## 2018-07-16 LAB — PREPARE RBC (CROSSMATCH)

## 2018-07-16 LAB — COMPREHENSIVE METABOLIC PANEL
ALT: 13 U/L (ref 0–44)
AST: 16 U/L (ref 15–41)
Albumin: 3.7 g/dL (ref 3.5–5.0)
Alkaline Phosphatase: 49 U/L (ref 38–126)
Anion gap: 11 (ref 5–15)
BUN: 65 mg/dL — ABNORMAL HIGH (ref 6–20)
CO2: 19 mmol/L — ABNORMAL LOW (ref 22–32)
Calcium: 6.5 mg/dL — ABNORMAL LOW (ref 8.9–10.3)
Chloride: 107 mmol/L (ref 98–111)
Creatinine, Ser: 5.2 mg/dL — ABNORMAL HIGH (ref 0.61–1.24)
GFR calc Af Amer: 13 mL/min — ABNORMAL LOW (ref >60)
GFR calc non Af Amer: 11 mL/min — ABNORMAL LOW (ref >60)
Glucose, Bld: 74 mg/dL (ref 70–99)
Potassium: 4.4 mmol/L (ref 3.5–5.1)
Sodium: 137 mmol/L (ref 135–145)
Total Bilirubin: 0.6 mg/dL (ref 0.3–1.2)
Total Protein: 6.4 g/dL — ABNORMAL LOW (ref 6.5–8.1)

## 2018-07-16 MED ORDER — IOHEXOL 300 MG/ML  SOLN
50.0000 mL | Freq: Once | INTRAMUSCULAR | Status: AC | PRN
  Administered 2018-07-16: 30 mL via ORAL

## 2018-07-16 MED ORDER — ACYCLOVIR 200 MG PO CAPS
ORAL_CAPSULE | ORAL | Status: AC
  Filled 2018-07-16: qty 1

## 2018-07-16 NOTE — Consult Note (Signed)
Referring Provider: No ref. provider found Primary Care Physician:  Patient, No Pcp Per Primary Gastroenterologist:  Dr. Gala Romney  Date of Admission: 07/15/2018 Date of Consultation: 07/16/2018  Reason for Consultation:  hematochezia  HPI:  [name] is a 58 y.o. male with a past medical history of BPH, testicular cancer, chronic kidney disease, macrocytic anemia, blood transfusion, multiple myeloma not in remission currently undergoing chemotherapy.  The patient called our office yesterday with complaints of right-sided pelvic pain 7-8/10, some rectal bleeding, lightheadedness.  Recent blood transfusion 1 week ago.  No reports of straining during bowel movement.  Currently undergoing chemotherapy.  Has a history of diverticula on CT imaging and recent colonoscopy.  He was recommended to proceed to the emergency department due to his symptoms and possibility of significant illness.  He was seen in the emergency department yesterday.  Also noted chronic renal failure followed by nephrology.  2 episodes of rectal bleeding and occasional rectal bleeding over the past several weeks that seem to worsen yesterday.  Known hemorrhoids and diverticulosis.  No abdominal pain.  Also complains of generalized fatigue, denies dizziness, syncope, shortness of breath, chest pain.  His CBC in emergency department found stable hemoglobin at 7.1 compared to the day prior, normal platelets, macrocytic hyperchromic indices, creatinine significantly elevated at 5.16 with known chronic kidney disease, lipase normal.  Interestingly his occult blood card was negative.  Recent CT of the abdomen and pelvis completed 06/12/2018 with wall thickening in the mid sigmoid colon likely due to muscular hypertrophy from chronic diverticulosis but earliest changes of diverticulitis difficult to exclude.  He did have a drop in his hemoglobin today to 6.6.  CMP stable.  Today he states he has had intermittent low-volume hematochezia  over the past couple weeks.  It seemed to worsen yesterday and had to bowel movements with some blood in it.  There seem to be some small "stringy clots."  Denies melena.  He also admits significant abdominal pain, it was difficult to stand yesterday it hurts so bad.  Pain is generalized abdomen and off to the right flank.  Denies overt nausea and vomiting.  He states he has had weakness and fatigue ever since coming to the hospital, none previously.  No other GI complaints.  Past Medical History:  Diagnosis Date  . Allergy   . Arthritis    neck and back  . Blood transfusion without reported diagnosis   . BPH (benign prostatic hyperplasia)   . Cancer [facility]) 2004   testicle  . Chronic kidney disease    kidney stones  . Left lumbar radiculopathy 06/12/2016  . 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 Medical history non-contributory    Pt has scattered thoughts and uncertain of past medical history  . 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 Stones, urinary tract   . Substance abuse ([place])    prescribed oxydcodone- 10 years    Past Surgical History:  Procedure Laterality Date  . BACK SURGERY     x5  . CERVICAL DISC SURGERY     x2  . COLONOSCOPY WITH PROPOFOL N/A 08/11/2016   Procedure: COLONOSCOPY WITH PROPOFOL;  Surgeon: [name], MD;  Location: [facility];  Service: Endoscopy;  Laterality: N/A;  130   . POLYPECTOMY  08/11/2016   Procedure: POLYPECTOMY;  Surgeon: [name], MD;  Location: [facility];  Service: Endoscopy;;  colon  . testicular cancer  2004  . TONSILLECTOMY      Prior to Admission medications  Medication Sig Start Date End Date Taking? Authorizing Provider  acyclovir (ZOVIRAX) 200 MG capsule Take 200 mg by mouth 2 (two) times daily.   Yes [provider]  bortezomib IV (VELCADE) 3.5 MG injection Inject 1.3 mg/m2 into the vein once a week.    Yes [provider]  CYCLOPHOSPHAMIDE IV Inject into the vein once a week.   Yes [provider]  HYDROmorphone (DILAUDID) 2 MG tablet Take 0.5 tablets (1 mg total) by mouth every 8 (eight) hours as needed for severe pain. Patient taking differently: Take 2 mg by mouth every 8 (eight) hours as needed for severe pain.  07/12/18  Yes Derek Jack, MD  Misc. Devices MISC Please provide patient with rollaider.  Dx: multiple myeloma C90.0 07/02/18  Yes Derek Jack, MD  senna-docusate (SENOKOT-S) 8.6-50 MG tablet Take 2 tablets by mouth 2 (two) times daily for 30 days. 06/23/18 07/23/18 Yes Shah, Pratik D, DO  torsemide (DEMADEX) 20 MG tablet Take 2 tablets (40 mg total) by mouth daily for 30 days. 07/14/18 08/13/18 Yes Derek Jack, MD  diphenhydrAMINE (BENADRYL) 25 MG tablet Take 1 tablet (25 mg total) by mouth as needed for itching. Patient not taking: Reported on 07/15/2018 07/12/18   Derek Jack, MD  methylPREDNISolone (MEDROL DOSEPAK) 4 MG TBPK tablet Take as directed Patient not taking: Reported on 07/15/2018 07/14/18   Derek Jack, MD    Current Facility-Administered Medications  Medication Dose Route Frequency Provider Last Rate Last Dose  . 0.9 %  sodium chloride infusion  250 mL Intravenous PRN Opyd, Ilene Qua, MD      . 0.9 %  sodium chloride infusion   Intravenous Continuous Opyd, Ilene Qua, MD 75 mL/hr at 07/16/18 0037    . acetaminophen (TYLENOL) tablet 650 mg  650 mg Oral Q6H PRN Opyd, Ilene Qua, MD       Or  . acetaminophen (TYLENOL) suppository 650 mg  650 mg Rectal Q6H PRN Opyd, Ilene Qua, MD      . calcium carbonate (TUMS - dosed in mg elemental calcium) chewable tablet 400 mg of elemental calcium  400 mg of elemental calcium Oral BID Opyd, Ilene Qua, MD      . HYDROmorphone (DILAUDID) tablet 1-2 mg  1-2 mg Oral Q8H PRN Opyd, Ilene Qua, MD   2 mg at 07/16/18 0446  . ondansetron (ZOFRAN) tablet 4 mg  4 mg Oral Q6H PRN Opyd, Ilene Qua, MD       Or  . ondansetron (ZOFRAN) injection 4 mg  4 mg Intravenous Q6H PRN Opyd, Ilene Qua, MD       . sodium chloride flush (NS) 0.9 % injection 3 mL  3 mL Intravenous Q12H Opyd, Ilene Qua, MD   3 mL at 07/16/18 0055  . sodium chloride flush (NS) 0.9 % injection 3 mL  3 mL Intravenous PRN Opyd, Ilene Qua, MD        Allergies as of 07/15/2018 - Review Complete 07/15/2018  Allergen Reaction Noted  . Acetaminophen Nausea Only and Other (See Comments) 01/17/2015  . Cymbalta [duloxetine hcl] Swelling 07/09/2016  . Diclofenac sodium Swelling 02/25/2010  . Imodium [loperamide] Swelling 01/17/2015  . Lyrica [pregabalin] Swelling 06/18/2016  . Morphine Other (See Comments) 02/25/2010  . Vioxx [rofecoxib] Swelling 07/18/2015  . Aleve [naproxen] Swelling 07/18/2015    Family History  Problem Relation Age of Onset  . COPD Mother   . Heart disease Mother   . Other Father  Never knew his father.  . Thyroid disease Sister   . Arthritis Sister   . Heart disease Sister        bypass  . Colon cancer Neg Hx     Social History   Socioeconomic History  . Marital status: Single    Spouse name: Not on file  . Number of children: 1  . Years of education: 65  . Highest education level: Not on file  Occupational History  . Occupation: retired    Comment: Psychologist, prison and probation services  . Occupation: disabled  Social Needs  . Financial resource strain: Not on file  . Food insecurity:    Worry: Not on file    Inability: Not on file  . Transportation needs:    Medical: Not on file    Non-medical: Not on file  Tobacco Use  . Smoking status: Current Every Day Smoker    Packs/day: 1.00    Years: 40.00    Pack years: 40.00    Types: Cigarettes    Start date: 03/31/1974  . Smokeless tobacco: Never Used  Substance and Sexual Activity  . Alcohol use: Yes    Comment: Occasional  . Drug use: No    Types: Cocaine    Comment: Remote hx of cocaine, quit 1989  . Sexual activity: Yes  Lifestyle  . Physical activity:    Days per week: Not on file    Minutes per session: Not on file  . Stress: Not on  file  Relationships  . Social connections:    Talks on phone: Not on file    Gets together: Not on file    Attends religious service: Not on file    Active member of club or organization: Not on file    Attends meetings of clubs or organizations: Not on file    Relationship status: Not on file  . Intimate partner violence:    Fear of current or ex partner: Not on file    Emotionally abused: Not on file    Physically abused: Not on file    Forced sexual activity: Not on file  Other Topics Concern  . Not on file  Social History Narrative   [employer] for 12 years   [employer] for 65 years      Never married   One daughter      Lives alone   Retail banker   Right-handed   Occasional caffeine use          Review of Systems: General: Negative for anorexia, weight loss, fever, chills, fatigue, weakness. ENT: Negative for hoarseness, difficulty swallowing. CV: Negative for chest pain, angina, palpitations, peripheral edema.  Respiratory: Negative for dyspnea at rest, cough, sputum, wheezing.  GI: See history of present illness. MS: Chronic back pain.  Derm: Negative for rash or itching.  Endo: Negative for unusual weight change.  Heme: Negative for bruising or bleeding. Allergy: Negative for rash or hives.  Physical Exam: Vital signs in last 24 hours: Temp:  [98.3 F (36.8 C)-98.7 F (37.1 C)] 98.5 F (36.9 C) (04/17 0630) Pulse Rate:  [76-102] 78 (04/17 0630) Resp:  [16-18] 18 (04/16 2353) BP: (103-121)/(51-71) 103/51 (04/17 0630) SpO2:  [94 %-100 %] 99 % (04/17 0630) Weight:  [71.5 kg] 71.5 kg (04/16 2352) Last BM Date: 07/14/18 General:   Alert,  Well-developed, well-nourished, pleasant and cooperative in NAD. Seems frustrated with being in the hospital. Eyes:  Sclera clear, no icterus. Conjunctiva pink. Ears:  Normal  auditory acuity. Nose:  No deformity, discharge, or lesions. Lungs:  Clear throughout to auscultation. No wheezes, crackles, or  rhonchi. No acute distress. Heart:  Regular rate and rhythm; no murmurs, clicks, rubs, or gallops. Abdomen:  Soft, significantly tender generalized abdomen. No masses, hepatosplenomegaly or hernias noted. Normal bowel sounds, and without rebound.   Rectal:  Deferred until time of colonoscopy.   Msk:  Symmetrical without gross deformities. Extremities:  Without clubbing or edema. Neurologic:  Alert and  oriented x4;  grossly normal neurologically. Psych:  Alert and cooperative. Normal mood and affect.  Intake/Output from previous day: 04/16 0701 - 04/17 0700 In: -  Out: 350 [Urine:350] Intake/Output this shift: No intake/output data recorded.  Lab Results: Recent Labs    07/14/18 0802 07/15/18 2032 07/16/18 0437  WBC 3.9* 4.9 4.2  HGB 7.2* 7.1* 6.6*  HCT 22.7* 22.0* 20.6*  PLT 175 184 167   BMET Recent Labs    07/14/18 0802 07/15/18 2032 07/16/18 0437  NA 137 136 137  K 4.6 4.3 4.4  CL 104 105 107  CO2 19* 18* 19*  GLUCOSE 115* 94 74  BUN 65* 66* 65*  CREATININE 5.09* 5.16* 5.20*  CALCIUM 6.6* 6.5* 6.5*   LFT Recent Labs    07/14/18 0802 07/15/18 2032 07/16/18 0437  PROT 6.8 7.0 6.4*  ALBUMIN 4.0 4.1 3.7  AST 16 16 16   ALT 14 14 13   ALKPHOS 52 51 49  BILITOT 0.8 0.7 0.6   PT/INR No results for input(s): LABPROT, INR in the last 72 hours. Hepatitis Panel No results for input(s): HEPBSAG, HCVAB, HEPAIGM, HEPBIGM in the last 72 hours. C-Diff No results for input(s): CDIFFTOX in the last 72 hours.  Studies/Results: No results found.  Impression: 58 year old male who presents with a history of multiple myeloma currently undergoing chemotherapy, macrocytic anemia, recent low-volume hematochezia.  Colonoscopy last completed 2018 which found nonbleeding internal hemorrhoids and a single 4 mm polyp in the rectum.  Recommended 10-year repeat exam.  On intensive chemotherapy for multiple myeloma he has been anemic ongoing with a baseline between 7 and 7.5.  His  anemia is macrocytic.  His B12 in mid March was normal, but his folate was low.  His hemoglobin today dropped to 6.6, although he did not have much with the room.  There is an order for PRBC transfusion.  I do not feel he has significant GI bleed that justifies need for inpatient/emergent colonoscopy, especially with heme negative exam in the ER.  Overall anemia is likely multifactorial in nature (multiple myeloma, on chemotherapy, folate deficiency, low volume hematochezia).  At this point we will update some labs including iron and ferritin to ensure no iron deficiency component.  He will likely benefit from folate supplementation.  We will check a CT of the abdomen to ensure no surprise etiologies behind his significant abdominal pain.  However, this will have to be a noncontrast study as he has chronic kidney disease and his creatinine would not tolerate contrast.    Plan: 1. Ferritin, Iron 2. CT abdomen/pelvis without contrast (CKD with elevated Cr) 3. Transfuse as necessary 4. Can eat after CT exam 5. Would likely benefit from Folate supplementation 6. Supportive measutes   Thank you for allowing [name] to participate in the care of Tilda Burrow, DNP, AGNP-C Adult & Gerontological Nurse Practitioner [facility]    LOS: 0 days     07/16/2018, 8:05 AM

## 2018-07-16 NOTE — Progress Notes (Signed)
CT results reviewed. No acute changes or explanation for abdominal pain. Gave the ok to feed the 2g sodium diet.  Will continue to follow.  Thank you for allowing [name] to participate in the care of Brandon Burrow, DNP, AGNP-C Adult & Gerontological Nurse Practitioner [facility]

## 2018-07-16 NOTE — Progress Notes (Signed)
Patient Demographics:    [name], is a 58 y.o. male, DOB - September 13, 1960, HWK:[id number]  Admit date - 07/15/2018   Admitting Physician Vianne Bulls, MD  Outpatient Primary MD for the patient is Patient, No Pcp Per  LOS - 0   Chief Complaint  Patient presents with  . Flank Pain  . Rectal Bleeding        Subjective:    [name] today has no fevers, no emesis,  No chest pain,  C/o abd pain, had intermittent rectal bleeding   Assessment  & Plan :    Principal Problem:   Rectal bleeding Active Problems:   Hypocalcemia   Kappa light chain myeloma (HCC)   CKD (chronic kidney disease), stage V (HCC)  Brief History: 58 y.o. male with medical history significant for substance abuse, Spinal Stenosis/lumbar radiculopathy status post fusion (07/24/2015), COPD, kappa light chain myeloma on chemotherapy, and chronic kidney disease stage V admitted 07/15/2018 with intermittent rectal bleeding   Assessment/Plan:   1)Rectal Bleeding---acute on chronic macrocytic anemia secondary to underlying multiple myeloma compounded by acute blood loss anemia --- on admission hemoglobin was 7.1 drifted down to 6.6 , hemoglobin was 8.2 about 3 weeks ago,,, patient with dizziness and tachycardia, okay to transfuse 1 unit of backslash and reevaluate,   continue to monitor for ongoing rectal bleeding transfuse as clinically indicated... Per hematology okay to consider ESA if Hgb < 8 will defer to hematology and nephrology  2)Acute Blood Loss Anemia leading to acute on chronic Anemia---- as noted above management as above  3)Kappa Light chain Myeloma---Bone marrow biopsy on 06/16/18 with 87% plasma cells, Kappa restricted;Skeletal bone survey without lytic lesion, patient work-up revealed -SPEP--positive M-spike in gamma region, -serum kappa/lambda light chains--elevated -urine immunofixation = kappa Bence Jones  protein, kappa type. SPEP shows 0.4g of monoclonal protein  4)CKD V---recent renal ultrasound without hydronephrosis, avoid nephrotoxic agents  5)Lumbar Radiculopathy--- -Spinal Stenosis/lumbar radiculopathy status post fusion (07/24/2015)---- 05/06/2018 MRI L-spine--diffusely abnormal bone marrow signal concerning for myeloproliferative disorder versus infiltrative metastatic disease. Abnormal prevertebraltissue as discussed above at L2-3 -3/16 MR T-spine--no acute findings, neurosurgery, Dr. Melynda Keller nonoperative managementfor radiculopathy  6)COPD- -Stable on room air, continue PRN bronchodilators----Patient states that he quit smoking 1 month PTA   Disposition Plan: TBD  Family Communication:na  Consultants:Gi  Code Status: FULL   DVT Prophylaxis: SCD  Code Status : Full  Family Communication:   NA  DVT Prophylaxis  :    SCDs   Lab Results  Component Value Date   PLT 167 07/16/2018    Inpatient Medications  Scheduled Meds: . calcium carbonate  400 mg of elemental calcium Oral BID  . sodium chloride flush  3 mL Intravenous Q12H   Continuous Infusions: . sodium chloride    . sodium chloride 75 mL/hr at 07/16/18 0037   PRN Meds:.sodium chloride, acetaminophen **OR** acetaminophen, HYDROmorphone, ondansetron **OR** ondansetron (ZOFRAN) IV, sodium chloride flush    Anti-infectives (From admission, onward)   Start     Dose/Rate Route Frequency Ordered Stop   07/15/18 2315  acyclovir (ZOVIRAX) 200 MG capsule 200 mg  Status:  Discontinued     200 mg Oral 2 times daily 07/15/18 2310 07/15/18 2326  Objective:   Vitals:   07/15/18 2330 07/15/18 2352 07/15/18 2353 07/16/18 0630  BP: 117/68  118/68 (!) 103/51  Pulse: 87  76 78  Resp:   18   Temp:   98.3 F (36.8 C) 98.5 F (36.9 C)  TempSrc:   Oral Oral  SpO2: 95%  100% 99%  Weight:  71.5 kg    Height:  6' (1.829 m)      Wt Readings from Last 3 Encounters:  07/15/18 71.5 kg   07/14/18 71.4 kg  07/07/18 72.1 kg    Intake/Output Summary (Last 24 hours) at 07/16/2018 0737 Last data filed at 07/16/2018 0400 Gross per 24 hour  Intake -  Output 350 ml  Net -350 ml   Physical Exam Patient is examined daily including today on 07/16/18 , exams remain the same as of yesterday except that has changed   Gen:- Awake Alert,  In no apparent distress  HEENT:- [place].AT, No sclera icterus Neck-Supple Neck,No JVD,.  Lungs-  CTAB , fair symmetrical air movement CV- S1, S2 normal, regular  Abd-  +ve B.Sounds, Abd Soft, No tenderness,    Extremity/Skin:- No  edema, pedal pulses present  Psych-affect is appropriate, oriented x3 Neuro-no new focal deficits, no tremors   Data Review:   Micro Results No results found for this or any previous visit (from the past 240 hour(s)).  Radiology Reports Ct Biopsy  Result Date: 06/16/2018 INDICATION: 58 year old male referred for bone marrow biopsy with history of possible multiple myeloma EXAM: CT BONE MARROW BIOPSY AND ASPIRATION; CT BIOPSY MEDICATIONS: None. ANESTHESIA/SEDATION: Moderate (conscious) sedation was employed during this procedure. A total of Versed 3.0 mg and Fentanyl 100 mcg was administered intravenously. Moderate Sedation Time: 10 minutes. The patient's level of consciousness and vital signs were monitored continuously by radiology nursing throughout the procedure under my direct supervision. FLUOROSCOPY TIME:  CT COMPLICATIONS: None PROCEDURE: The procedure risks, benefits, and alternatives were explained to the patient. Questions regarding the procedure were encouraged and answered. The patient understands and consents to the procedure. Scout CT of the pelvis was performed for surgical planning purposes. The posterior pelvis was prepped with Chlorhexidine in a sterile fashion, and a sterile drape was applied covering the operative field. A sterile gown and sterile gloves were used for the procedure. Local anesthesia was  provided with 1% Lidocaine. Posterior left iliac bone was targeted for biopsy. The skin and subcutaneous tissues were infiltrated with 1% lidocaine without epinephrine. A small stab incision was made with an 11 blade scalpel, and an 11 gauge Murphy needle was advanced with CT guidance to the posterior cortex. Manual forced was used to advance the needle through the posterior cortex and the stylet was removed. A bone marrow aspirate was retrieved and passed to a cytotechnologist in the room. The initial aspirate was negative for visualized spicules/bone particles. Secondary aspirate was performed with heparin. The Murphy needle was then advanced without the stylet for a core biopsy. The core biopsy was retrieved and also passed to a cytotechnologist. A second core biopsy was achieved given the absence of bone particles. Manual pressure was used for hemostasis and a sterile dressing was placed. No complications were encountered no significant blood loss was encountered. Patient tolerated the procedure well and remained hemodynamically stable throughout. IMPRESSION: Status post CT-guided bone marrow biopsy, with tissue specimen sent to pathology for complete histopathologic analysis Signed, Dulcy Fanny. Earleen Newport, DO [facility] Electronically Signed   By: Corrie Mckusick  D.O.   On: 06/16/2018 12:03   Ct Bone Marrow Biopsy & Aspiration  Result Date: 06/16/2018 INDICATION: 58 year old male referred for bone marrow biopsy with history of possible multiple myeloma EXAM: CT BONE MARROW BIOPSY AND ASPIRATION; CT BIOPSY MEDICATIONS: None. ANESTHESIA/SEDATION: Moderate (conscious) sedation was employed during this procedure. A total of Versed 3.0 mg and Fentanyl 100 mcg was administered intravenously. Moderate Sedation Time: 10 minutes. The patient's level of consciousness and vital signs were monitored continuously by radiology nursing throughout the procedure under my  direct supervision. FLUOROSCOPY TIME:  CT COMPLICATIONS: None PROCEDURE: The procedure risks, benefits, and alternatives were explained to the patient. Questions regarding the procedure were encouraged and answered. The patient understands and consents to the procedure. Scout CT of the pelvis was performed for surgical planning purposes. The posterior pelvis was prepped with Chlorhexidine in a sterile fashion, and a sterile drape was applied covering the operative field. A sterile gown and sterile gloves were used for the procedure. Local anesthesia was provided with 1% Lidocaine. Posterior left iliac bone was targeted for biopsy. The skin and subcutaneous tissues were infiltrated with 1% lidocaine without epinephrine. A small stab incision was made with an 11 blade scalpel, and an 11 gauge Murphy needle was advanced with CT guidance to the posterior cortex. Manual forced was used to advance the needle through the posterior cortex and the stylet was removed. A bone marrow aspirate was retrieved and passed to a cytotechnologist in the room. The initial aspirate was negative for visualized spicules/bone particles. Secondary aspirate was performed with heparin. The Murphy needle was then advanced without the stylet for a core biopsy. The core biopsy was retrieved and also passed to a cytotechnologist. A second core biopsy was achieved given the absence of bone particles. Manual pressure was used for hemostasis and a sterile dressing was placed. No complications were encountered no significant blood loss was encountered. Patient tolerated the procedure well and remained hemodynamically stable throughout. IMPRESSION: Status post CT-guided bone marrow biopsy, with tissue specimen sent to pathology for complete histopathologic analysis Signed, Dulcy Fanny. Earleen Newport, DO [facility] Electronically Signed   By: Corrie Mckusick D.O.   On: 06/16/2018 12:03     CBC Recent  Labs  Lab 07/14/18 0802 07/15/18 2032 07/16/18 0437  WBC 3.9* 4.9 4.2  HGB 7.2* 7.1* 6.6*  HCT 22.7* 22.0* 20.6*  PLT 175 184 167  MCV 108.1* 105.8* 106.2*  MCH 34.3* 34.1* 34.0  MCHC 31.7 32.3 32.0  RDW 18.9* 18.9* 18.9*  LYMPHSABS 0.7 0.7  --   MONOABS 0.8 0.9  --   EOSABS 0.3 0.3  --   BASOSABS 0.0 0.0  --     Chemistries  Recent Labs  Lab 07/14/18 0802 07/14/18 0803 07/15/18 2032 07/16/18 0437  NA 137  --  136 137  K 4.6  --  4.3 4.4  CL 104  --  105 107  CO2 19*  --  18* 19*  GLUCOSE 115*  --  94 74  BUN 65*  --  66* 65*  CREATININE 5.09*  --  5.16* 5.20*  CALCIUM 6.6*  --  6.5* 6.5*  MG  --  1.9  --   --   AST 16  --  16 16  ALT 14  --  14 13  ALKPHOS 52  --  51 49  BILITOT 0.8  --  0.7 0.6   ------------------------------------------------------------------------------------------------------------------ No results for input(s): CHOL, HDL, LDLCALC, TRIG, CHOLHDL,  LDLDIRECT in the last 72 hours.  No results found for: HGBA1C ------------------------------------------------------------------------------------------------------------------ No results for input(s): TSH, T4TOTAL, T3FREE, THYROIDAB in the last 72 hours.  Invalid input(s): FREET3 ------------------------------------------------------------------------------------------------------------------ No results for input(s): VITAMINB12, FOLATE, FERRITIN, TIBC, IRON, RETICCTPCT in the last 72 hours.  Coagulation profile No results for input(s): INR, PROTIME in the last 168 hours.  No results for input(s): DDIMER in the last 72 hours.  Cardiac Enzymes No results for input(s): CKMB, TROPONINI, MYOGLOBIN in the last 168 hours.  Invalid input(s): CK ------------------------------------------------------------------------------------------------------------------ No results found for: BNP   Roxan Hockey M.D on 07/16/2018 at 7:37 AM  Go to [email or web address] - for contact info  [facility] -  Office  [phone]

## 2018-07-16 NOTE — Progress Notes (Signed)
CRITICAL VALUE ALERT  Critical Value: hemoglobin 6.6  Date & Time Notied: 07/16/2018 06:08  Provider Notified: Yes / T. Opyd   Orders Received/Actions taken:  Awaiting response.

## 2018-07-17 DIAGNOSIS — N179 Acute kidney failure, unspecified: Secondary | ICD-10-CM | POA: Diagnosis not present

## 2018-07-17 DIAGNOSIS — K625 Hemorrhage of anus and rectum: Secondary | ICD-10-CM

## 2018-07-17 DIAGNOSIS — N189 Chronic kidney disease, unspecified: Secondary | ICD-10-CM

## 2018-07-17 DIAGNOSIS — N185 Chronic kidney disease, stage 5: Secondary | ICD-10-CM | POA: Diagnosis not present

## 2018-07-17 DIAGNOSIS — D649 Anemia, unspecified: Secondary | ICD-10-CM | POA: Diagnosis not present

## 2018-07-17 DIAGNOSIS — R1084 Generalized abdominal pain: Secondary | ICD-10-CM | POA: Diagnosis not present

## 2018-07-17 LAB — TYPE AND SCREEN
ABO/RH(D): A POS
Antibody Screen: NEGATIVE
Unit division: 0

## 2018-07-17 LAB — CBC
HCT: 26.6 % — ABNORMAL LOW (ref 39.0–52.0)
Hemoglobin: 8.5 g/dL — ABNORMAL LOW (ref 13.0–17.0)
MCH: 32.6 pg (ref 26.0–34.0)
MCHC: 32 g/dL (ref 30.0–36.0)
MCV: 101.9 fL — ABNORMAL HIGH (ref 80.0–100.0)
Platelets: 169 10*3/uL (ref 150–400)
RBC: 2.61 MIL/uL — ABNORMAL LOW (ref 4.22–5.81)
RDW: 21.7 % — ABNORMAL HIGH (ref 11.5–15.5)
WBC: 3.6 10*3/uL — ABNORMAL LOW (ref 4.0–10.5)
nRBC: 0 % (ref 0.0–0.2)

## 2018-07-17 LAB — BPAM RBC
Blood Product Expiration Date: 202004202359
ISSUE DATE / TIME: 202004170839
Unit Type and Rh: 600

## 2018-07-17 NOTE — Progress Notes (Signed)
Subjective:  Patient states he had a bowel movement last night it was liquid stool.  He had abdominal pelvic CT with contrast earlier in the day.  He did not see any blood per rectum.  He continues to complain of pain in lower abdomen mainly in the right lower quadrant.  He also complains of pain in right lumbar region.  He states he has been told that he has a chronic abdominal pain.  He is on Dilaudid for back pain which he uses on as-needed basis.    Objective: Blood pressure (!) 124/56, pulse 93, temperature 98.4 F (36.9 C), temperature source Oral, resp. rate 20, height 6' (1.829 m), weight 72.5 kg, SpO2 100 %. Patient is alert and comfortable lying on his right side. He did complain of some back pain when he was asked to lie on his back. Abdomen is symmetrical.  Bowel sounds are hyperactive.  On palpation abdomen is soft.  He has mild tenderness in right lower quadrant.  No guarding or masses noted.  Labs/studies Results:  CBC Latest Ref Rng & Units 07/17/2018 07/16/2018 07/16/2018  WBC 4.0 - 10.5 K/uL 3.6(L) 3.8(L) 4.2  Hemoglobin 13.0 - 17.0 g/dL 8.5(L) 7.7(L) 6.6(LL)  Hematocrit 39.0 - 52.0 % 26.6(L) 24.2(L) 20.6(L)  Platelets 150 - 400 K/uL 169 170 167    CMP Latest Ref Rng & Units 07/16/2018 07/15/2018 07/14/2018  Glucose 70 - 99 mg/dL 74 94 115(H)  BUN 6 - 20 mg/dL 65(H) 66(H) 65(H)  Creatinine 0.61 - 1.24 mg/dL 5.20(H) 5.16(H) 5.09(H)  Sodium 135 - 145 mmol/L 137 136 137  Potassium 3.5 - 5.1 mmol/L 4.4 4.3 4.6  Chloride 98 - 111 mmol/L 107 105 104  CO2 22 - 32 mmol/L 19(L) 18(L) 19(L)  Calcium 8.9 - 10.3 mg/dL 6.5(L) 6.5(L) 6.6(L)  Total Protein 6.5 - 8.1 g/dL 6.4(L) 7.0 6.8  Total Bilirubin 0.3 - 1.2 mg/dL 0.6 0.7 0.8  Alkaline Phos 38 - 126 U/L 49 51 52  AST 15 - 41 U/L 16 16 16  ALT 0 - 44 U/L 13 14 14    Hepatic Function Latest Ref Rng & Units 07/16/2018 07/15/2018 07/14/2018  Total Protein 6.5 - 8.1 g/dL 6.4(L) 7.0 6.8  Albumin 3.5 - 5.0 g/dL 3.7 4.1 4.0  AST 15 - 41  U/L 16 16 16  ALT 0 - 44 U/L 13 14 14  Alk Phosphatase 38 - 126 U/L 49 51 52  Total Bilirubin 0.3 - 1.2 mg/dL 0.6 0.7 0.8  Bilirubin, Direct 0.0 - 0.2 mg/dL - - -    Abdomino-pelvic CT from yesterday reviewed. Small bilateral pleural effusions.  Unremarkable liver and spleen. No abnormality noted to stomach small bowel and appendix.  Colonic diverticulosis but no evidence of diverticulitis.  He has subtle lucencies involving cortex of bilateral pelvis sacrum and proximal femurs as well as both ischia.  He also has lucency adjacent to right pedicle at L3 as well as surgical changes from previous fusion between L1 and L4.  Assessment:  #1.  Rectal bleeding most likely secondary to internal hemorrhoids in the setting of chronic constipation.  Doubt diverticular bleed.  #2.  Anemia.  Patient has received 1 unit of PRBCs.  Hemoglobin is up from 6.6 on admission to 8.5 g today.  He has chronic disease anemia.  #3.  Chronic constipation secondary to medications.  Can continue senna and stool softener daily and use lactulose as a backup.  #4.  Abdominal pain appears to be chronic.  Most   of this pain may be referred from his back.  #5.  Multiple myeloma.  He is under care of Dr. Katragadda.   Recommendations:  Patient will continue high-fiber diet; stool softener as before and use lactulose only as a backup if he goes a day without a bowel movement. Patient stable for discharge from GI standpoint.  Discussed with Dr. Memon.      

## 2018-07-17 NOTE — Plan of Care (Signed)
Patient is progressing with care plan. 

## 2018-07-17 NOTE — Discharge Instructions (Signed)
Blood Transfusion, Adult, Care After This sheet gives you information about how to care for yourself after your procedure. Your doctor may also give you more specific instructions. If you have problems or questions, contact your doctor. Follow these instructions at home:   Take over-the-counter and prescription medicines only as told by your doctor.  Go back to your normal activities as told by your doctor.  Follow instructions from your doctor about how to take care of the area where an IV tube was put into your vein (insertion site). Make sure you: ? Wash your hands with soap and water before you change your bandage (dressing). If there is no soap and water, use hand sanitizer. ? Change your bandage as told by your doctor.  Check your IV insertion site every day for signs of infection. Check for: ? More redness, swelling, or pain. ? More fluid or blood. ? Warmth. ? Pus or a bad smell. Contact a doctor if:  You have more redness, swelling, or pain around the IV insertion site.  You have more fluid or blood coming from the IV insertion site.  Your IV insertion site feels warm to the touch.  You have pus or a bad smell coming from the IV insertion site.  Your pee (urine) turns pink, red, or brown.  You feel weak after doing your normal activities. Get help right away if:  You have signs of a serious allergic or body defense (immune) system reaction, including: ? Itchiness. ? Hives. ? Trouble breathing. ? Anxiety. ? Pain in your chest or lower back. ? Fever, flushing, and chills. ? Fast pulse. ? Rash. ? Watery poop (diarrhea). ? Throwing up (vomiting). ? Dark pee. ? Serious headache. ? Dizziness. ? Stiff neck. ? Yellow color in your face or the white parts of your eyes (jaundice). Summary  After a blood transfusion, return to your normal activities as told by your doctor.  Every day, check for signs of infection where the IV tube was put into your vein.  Some  signs of infection are warm skin, more redness and pain, more fluid or blood, and pus or a bad smell where the needle went in.  Contact your doctor if you feel weak or have any unusual symptoms. This information is not intended to replace advice given to you by your health care provider. Make sure you discuss any questions you have with your health care provider. Document Released: 04/07/2014 Document Revised: 11/09/2015 Document Reviewed: 11/09/2015 Elsevier Interactive Patient Education  2019 [place].   Anemia  Anemia is a condition in which you do not have enough red blood cells or hemoglobin. Hemoglobin is a substance in red blood cells that carries oxygen. When you do not have enough red blood cells or hemoglobin (are anemic), your body cannot get enough oxygen and your organs may not work properly. As a result, you may feel very tired or have other problems. What are the causes? Common causes of anemia include:  Excessive bleeding. Anemia can be caused by excessive bleeding inside or outside the body, including bleeding from the intestine or from periods in women.  Poor nutrition.  Long-lasting (chronic) kidney, thyroid, and liver disease.  Bone marrow disorders.  Cancer and treatments for cancer.  HIV (human immunodeficiency virus) and AIDS (acquired immunodeficiency syndrome).  Treatments for HIV and AIDS.  Spleen problems.  Blood disorders.  Infections, medicines, and autoimmune disorders that destroy red blood cells. What are the signs or symptoms? Symptoms of this condition  include:  Minor weakness.  Dizziness.  Headache.  Feeling heartbeats that are irregular or faster than normal (palpitations).  Shortness of breath, especially with exercise.  Paleness.  Cold sensitivity.  Indigestion.  Nausea.  Difficulty sleeping.  Difficulty concentrating. Symptoms may occur suddenly or develop slowly. If your anemia is mild, you may not have  symptoms. How is this diagnosed? This condition is diagnosed based on:  Blood tests.  Your medical history.  A physical exam.  Bone marrow biopsy. Your health care provider may also check your stool (feces) for blood and may do additional testing to look for the cause of your bleeding. You may also have other tests, including:  Imaging tests, such as a CT scan or MRI.  Endoscopy.  Colonoscopy. How is this treated? Treatment for this condition depends on the cause. If you continue to lose a lot of blood, you may need to be treated at a hospital. Treatment may include:  Taking supplements of iron, vitamin O06, or folic acid.  Taking a hormone medicine (erythropoietin) that can help to stimulate red blood cell growth.  Having a blood transfusion. This may be needed if you lose a lot of blood.  Making changes to your diet.  Having surgery to remove your spleen. Follow these instructions at home:  Take over-the-counter and prescription medicines only as told by your health care provider.  Take supplements only as told by your health care provider.  Follow any diet instructions that you were given.  Keep all follow-up visits as told by your health care provider. This is important. Contact a health care provider if:  You develop new bleeding anywhere in the body. Get help right away if:  You are very weak.  You are short of breath.  You have pain in your abdomen or chest.  You are dizzy or feel faint.  You have trouble concentrating.  You have bloody or black, tarry stools.  You vomit repeatedly or you vomit up blood. Summary  Anemia is a condition in which you do not have enough red blood cells or enough of a substance in your red blood cells that carries oxygen (hemoglobin).  Symptoms may occur suddenly or develop slowly.  If your anemia is mild, you may not have symptoms.  This condition is diagnosed with blood tests as well as a medical history and  physical exam. Other tests may be needed.  Treatment for this condition depends on the cause of the anemia. This information is not intended to replace advice given to you by your health care provider. Make sure you discuss any questions you have with your health care provider. Document Released: 04/24/2004 Document Revised: 04/18/2016 Document Reviewed: 04/18/2016 Elsevier Interactive Patient Education  2019 [employer].

## 2018-07-17 NOTE — Discharge Summary (Signed)
Physician Discharge Summary  [name] NBV:[id number] DOB: 05-03-1960 DOA: 07/15/2018  PCP: Rosita Fire, MD  Admit date: 07/15/2018 Discharge date: 07/17/2018  Admitted From: Home Disposition: Home  Recommendations for Outpatient Follow-up:  1. Follow up with PCP in 1-2 weeks 2. Please obtain BMP/CBC in one week 3. Follow-up with oncology as previously scheduled 4. Follow-up with nephrology as scheduled.  On his last admission, he was set up to follow-up with [facility]   Discharge Condition: Stable CODE STATUS: Full code Diet recommendation: Heart healthy  Brief/Interim Summary: 58 y.o.malewith medical history significant forsubstance abuse, Spinal Stenosis/lumbar radiculopathy status post fusion (07/24/2015), COPD, kappa light chain myeloma on chemotherapy, and chronic kidney disease stage V admitted 07/15/2018 with intermittent rectal bleeding  Discharge Diagnoses:  Principal Problem:   Rectal bleeding Active Problems:   Hypocalcemia   Kappa light chain myeloma (HCC)   CKD (chronic kidney disease), stage V ([place])   Acute kidney injury superimposed on CKD ([place])   Generalized abdominal pain  1. Rectal bleeding.  On admission, hemoglobin 70.7 0.1.  This drifted down to 6.6.  He was seen by gastroenterology.  It was felt that he may have had bleeding from internal hemorrhoids.  Was recommended that he continue with stool softeners.  Rectal bleeding has resolved at time of admission 2. Acute blood loss anemia.  Hemoglobin drifted down to 6.6.  He was transfused 1 unit of PRBC with improvement of hemoglobin to 8.5.  This can be further followed up as an outpatient.  Can consider Aranesp/Procrit as an outpatient.  Will defer to hematology/nephrology. 3. Myeloma.  Continue to follow-up with oncology 4. Chronic kidney disease stage V.  Creatinine is near baseline.  Continue to follow with nephrology as an outpatient. 5. Radiculopathy.  Seen by neurosurgery with  recommendations for nonoperative management. 6. COPD.  Stable on room air.  Continue current treatments.  Discharge Instructions  Discharge Instructions    Diet - low sodium heart healthy   Complete by:  As directed    Increase activity slowly   Complete by:  As directed      Allergies as of 07/17/2018      Reactions   Acetaminophen Nausea Only, Other (See Comments)   Elevated liver enzymes   Cymbalta [duloxetine Hcl] Swelling   Facial swelling   Diclofenac Sodium Swelling      Imodium [loperamide] Swelling   facial   Lyrica [pregabalin] Swelling   Morphine Other (See Comments)   Bradycardia   Vioxx [rofecoxib] Swelling   Aleve [naproxen] Swelling   eye      Medication List    STOP taking these medications   methylPREDNISolone 4 MG Tbpk tablet Commonly known as:  MEDROL DOSEPAK     TAKE these medications   acyclovir 200 MG capsule Commonly known as:  ZOVIRAX Take 200 mg by mouth 2 (two) times daily.   bortezomib IV 3.5 MG injection Commonly known as:  VELCADE Inject 1.3 mg/m2 into the vein once a week.   CYCLOPHOSPHAMIDE IV Inject into the vein once a week.   diphenhydrAMINE 25 MG tablet Commonly known as:  BENADRYL Take 1 tablet (25 mg total) by mouth as needed for itching.   HYDROmorphone 2 MG tablet Commonly known as:  Dilaudid Take 0.5 tablets (1 mg total) by mouth every 8 (eight) hours as needed for severe pain. What changed:  how much to take   Misc. Devices Misc Please provide patient with rollaider.  Dx: multiple myeloma C90.0  senna-docusate 8.6-50 MG tablet Commonly known as:  Senokot-S Take 2 tablets by mouth 2 (two) times daily for 30 days.   torsemide 20 MG tablet Commonly known as:  DEMADEX Take 2 tablets (40 mg total) by mouth daily for 30 days.      Follow-up Information    Rosita Fire, MD Follow up in 5 day(s).   Specialty:  Internal Medicine Why:  Dr. Legrand Rams will do a TELEHEALTH visit with you via phone. He will call you  between 9:30 and 9:45 a.m on Thursday 07/22/2018. Contact information: [place] [address] [phone]          Allergies  Allergen Reactions  . Acetaminophen Nausea Only and Other (See Comments)    Elevated liver enzymes  . Cymbalta [Duloxetine Hcl] Swelling    Facial swelling  . Diclofenac Sodium Swelling       . Imodium [Loperamide] Swelling    facial  . Lyrica [Pregabalin] Swelling  . Morphine Other (See Comments)    Bradycardia   . Vioxx [Rofecoxib] Swelling  . Aleve [Naproxen] Swelling    eye    Consultations:  Gastroenterology   Procedures/Studies: Ct Abdomen Pelvis Wo Contrast  Result Date: 07/16/2018 CLINICAL DATA:  58 year old male with a history of right-sided abdominal pain EXAM: CT ABDOMEN AND PELVIS WITHOUT CONTRAST TECHNIQUE: Multidetector CT imaging of the abdomen and pelvis was performed following the standard protocol without IV contrast. COMPARISON:  06/12/2018 FINDINGS: Lower chest: Bilateral pleural effusions with associated atelectasis. Differential attenuation of the blood pool and the myocardium, compatible with anemia. Hepatobiliary: Unremarkable liver.  Contracted gallbladder. Pancreas: Unremarkable pancreas. Spleen: Spleen unremarkable Adrenals/Urinary Tract: Unremarkable appearance of the adrenal glands. No evidence of hydronephrosis of the right or left kidney. No nephrolithiasis. Unremarkable course of the bilateral ureters. Unremarkable appearance of the urinary bladder. Stomach/Bowel: Unremarkable stomach. Unremarkable small bowel. Normal appendix. No significant stool burden. No focal inflammatory changes of the colon. Colonic diverticula are present, without definite associated inflammation. Vascular/Lymphatic: Atherosclerosis. Small lymph nodes of the inguinal region. No pelvic adenopathy or retroperitoneal adenopathy. Reproductive: Unremarkable pelvic structures Other: Mild edema of the body wall. Musculoskeletal: No  acute displaced fracture. Similar appearance of the appendicular and the axial visualized skeleton, with subtle lucencies involving cortex of the bilateral pelvis, sacrum, proximal femurs. Focal low-density lesions of the bilateral ischium, unchanged. Lucency adjacent to the right pedicle sclero of L3, unchanged. Surgical changes of posterior lumbar interbody fusion with bilateral pedicle screw and rod fixation, spanning L1-L4 with laminectomy. IMPRESSION: No acute CT finding to account for abdominal pain. No large stool burden. Bony changes compatible given diagnosis of multiple myeloma. Anemia Small bilateral pleural effusions with mild body wall edema/anasarca. Aortic Atherosclerosis (ICD10-I70.0). Electronically Signed   By: Corrie Mckusick D.O.   On: 07/16/2018 12:57       Subjective: No further rectal bleeding.  Discharge Exam: Vitals:   07/16/18 2035 07/16/18 2116 07/17/18 0500 07/17/18 0620  BP:  120/67  (!) 124/56  Pulse:  89  93  Resp:  16  20  Temp:  98.8 F (37.1 C)  98.4 F (36.9 C)  TempSrc:  Oral  Oral  SpO2: 97% 97%  100%  Weight:   71.4 kg 72.5 kg  Height:        General: Pt is alert, awake, not in acute distress Cardiovascular: RRR, S1/S2 +, no rubs, no gallops Respiratory: CTA bilaterally, no wheezing, no rhonchi Abdominal: Soft, NT, ND, bowel sounds + Extremities: no edema,  no cyanosis    The results of significant diagnostics from this hospitalization (including imaging, microbiology, ancillary and laboratory) are listed below for reference.     Microbiology: No results found for this or any previous visit (from the past 240 hour(s)).   Labs: BNP (last 3 results) No results for input(s): BNP in the last 8760 hours. Basic Metabolic Panel: Recent Labs  Lab 07/14/18 0802 07/14/18 0803 07/15/18 2032 07/16/18 0437  NA 137  --  136 137  K 4.6  --  4.3 4.4  CL 104  --  105 107  CO2 19*  --  18* 19*  GLUCOSE 115*  --  94 74  BUN 65*  --  66* 65*   CREATININE 5.09*  --  5.16* 5.20*  CALCIUM 6.6*  --  6.5* 6.5*  MG  --  1.9  --   --   PHOS 5.2*  --   --   --    Liver Function Tests: Recent Labs  Lab 07/14/18 0802 07/15/18 2032 07/16/18 0437  AST 16 16 16   ALT 14 14 13   ALKPHOS 52 51 49  BILITOT 0.8 0.7 0.6  PROT 6.8 7.0 6.4*  ALBUMIN 4.0 4.1 3.7   Recent Labs  Lab 07/15/18 2032  LIPASE 38   No results for input(s): AMMONIA in the last 168 hours. CBC: Recent Labs  Lab 07/14/18 0802 07/15/18 2032 07/16/18 0437 07/16/18 1411 07/17/18 0920  WBC 3.9* 4.9 4.2 3.8* 3.6*  NEUTROABS 2.0 2.9  --   --   --   HGB 7.2* 7.1* 6.6* 7.7* 8.5*  HCT 22.7* 22.0* 20.6* 24.2* 26.6*  MCV 108.1* 105.8* 106.2* 100.8* 101.9*  PLT 175 184 167 170 169   Cardiac Enzymes: No results for input(s): CKTOTAL, CKMB, CKMBINDEX, TROPONINI in the last 168 hours. BNP: Invalid input(s): POCBNP CBG: No results for input(s): GLUCAP in the last 168 hours. D-Dimer No results for input(s): DDIMER in the last 72 hours. Hgb A1c No results for input(s): HGBA1C in the last 72 hours. Lipid Profile No results for input(s): CHOL, HDL, LDLCALC, TRIG, CHOLHDL, LDLDIRECT in the last 72 hours. Thyroid function studies No results for input(s): TSH, T4TOTAL, T3FREE, THYROIDAB in the last 72 hours.  Invalid input(s): FREET3 Anemia work up No results for input(s): VITAMINB12, FOLATE, FERRITIN, TIBC, IRON, RETICCTPCT in the last 72 hours. Urinalysis    Component Value Date/Time   COLORURINE STRAW (A) 07/15/2018 2033   APPEARANCEUR CLEAR 07/15/2018 2033   LABSPEC 1.006 07/15/2018 2033   PHURINE 5.0 07/15/2018 2033   GLUCOSEU NEGATIVE 07/15/2018 2033   HGBUR SMALL (A) 07/15/2018 2033   BILIRUBINUR NEGATIVE 07/15/2018 2033   KETONESUR NEGATIVE 07/15/2018 2033   PROTEINUR NEGATIVE 07/15/2018 2033   UROBILINOGEN 0.2 02/09/2008 2042   NITRITE NEGATIVE 07/15/2018 2033   LEUKOCYTESUR NEGATIVE 07/15/2018 2033   Sepsis Labs Invalid input(s): PROCALCITONIN,   WBC,  LACTICIDVEN Microbiology No results found for this or any previous visit (from the past 240 hour(s)).   Time coordinating discharge: 39mns  SIGNED:   JKathie Dike MD  [facility] 07/17/2018, 7:43 PM   If 7PM-7AM, please contact night-coverage [email or web address]

## 2018-07-20 ENCOUNTER — Other Ambulatory Visit (HOSPITAL_COMMUNITY): Payer: Self-pay | Admitting: *Deleted

## 2018-07-20 DIAGNOSIS — C9000 Multiple myeloma not having achieved remission: Secondary | ICD-10-CM

## 2018-07-21 ENCOUNTER — Encounter (HOSPITAL_COMMUNITY): Payer: Self-pay | Admitting: Hematology

## 2018-07-21 ENCOUNTER — Inpatient Hospital Stay (HOSPITAL_COMMUNITY): Payer: Medicare Other

## 2018-07-21 ENCOUNTER — Other Ambulatory Visit: Payer: Self-pay

## 2018-07-21 ENCOUNTER — Inpatient Hospital Stay (HOSPITAL_BASED_OUTPATIENT_CLINIC_OR_DEPARTMENT_OTHER): Payer: Medicare Other | Admitting: Hematology

## 2018-07-21 DIAGNOSIS — C9000 Multiple myeloma not having achieved remission: Secondary | ICD-10-CM

## 2018-07-21 DIAGNOSIS — N189 Chronic kidney disease, unspecified: Secondary | ICD-10-CM | POA: Diagnosis not present

## 2018-07-21 DIAGNOSIS — M545 Low back pain: Secondary | ICD-10-CM

## 2018-07-21 DIAGNOSIS — Z5112 Encounter for antineoplastic immunotherapy: Secondary | ICD-10-CM | POA: Diagnosis not present

## 2018-07-21 DIAGNOSIS — D631 Anemia in chronic kidney disease: Secondary | ICD-10-CM

## 2018-07-21 LAB — CBC WITH DIFFERENTIAL/PLATELET
Abs Immature Granulocytes: 0.06 10*3/uL (ref 0.00–0.07)
Basophils Absolute: 0 10*3/uL (ref 0.0–0.1)
Basophils Relative: 1 %
Eosinophils Absolute: 0.5 10*3/uL (ref 0.0–0.5)
Eosinophils Relative: 11 %
HCT: 25.1 % — ABNORMAL LOW (ref 39.0–52.0)
Hemoglobin: 8 g/dL — ABNORMAL LOW (ref 13.0–17.0)
Immature Granulocytes: 1 %
Lymphocytes Relative: 18 %
Lymphs Abs: 0.8 10*3/uL (ref 0.7–4.0)
MCH: 32.9 pg (ref 26.0–34.0)
MCHC: 31.9 g/dL (ref 30.0–36.0)
MCV: 103.3 fL — ABNORMAL HIGH (ref 80.0–100.0)
Monocytes Absolute: 1.1 10*3/uL — ABNORMAL HIGH (ref 0.1–1.0)
Monocytes Relative: 26 %
Neutro Abs: 1.9 10*3/uL (ref 1.7–7.7)
Neutrophils Relative %: 43 %
Platelets: 186 10*3/uL (ref 150–400)
RBC: 2.43 MIL/uL — ABNORMAL LOW (ref 4.22–5.81)
RDW: 20.4 % — ABNORMAL HIGH (ref 11.5–15.5)
WBC: 4.4 10*3/uL (ref 4.0–10.5)
nRBC: 0 % (ref 0.0–0.2)

## 2018-07-21 LAB — COMPREHENSIVE METABOLIC PANEL
ALT: 13 U/L (ref 0–44)
AST: 15 U/L (ref 15–41)
Albumin: 4 g/dL (ref 3.5–5.0)
Alkaline Phosphatase: 48 U/L (ref 38–126)
Anion gap: 12 (ref 5–15)
BUN: 51 mg/dL — ABNORMAL HIGH (ref 6–20)
CO2: 19 mmol/L — ABNORMAL LOW (ref 22–32)
Calcium: 7.1 mg/dL — ABNORMAL LOW (ref 8.9–10.3)
Chloride: 108 mmol/L (ref 98–111)
Creatinine, Ser: 4.52 mg/dL — ABNORMAL HIGH (ref 0.61–1.24)
GFR calc Af Amer: 15 mL/min — ABNORMAL LOW (ref >60)
GFR calc non Af Amer: 13 mL/min — ABNORMAL LOW (ref >60)
Glucose, Bld: 99 mg/dL (ref 70–99)
Potassium: 4.7 mmol/L (ref 3.5–5.1)
Sodium: 139 mmol/L (ref 135–145)
Total Bilirubin: 0.6 mg/dL (ref 0.3–1.2)
Total Protein: 7.2 g/dL (ref 6.5–8.1)

## 2018-07-21 LAB — MAGNESIUM: Magnesium: 2.1 mg/dL (ref 1.7–2.4)

## 2018-07-21 MED ORDER — HYDROMORPHONE HCL 4 MG PO TABS: 4.0000 mg | ORAL_TABLET | Freq: Three times a day (TID) | ORAL | 0 refills | Status: DC | PRN

## 2018-07-21 NOTE — Progress Notes (Signed)
[name], [place] [id number]   CLINIC:  Medical Oncology/Hematology  PCP:  Rosita Fire, MD [address] [place] [address] [phone]   REASON FOR VISIT:  Follow-up for multiple myeloma      BRIEF ONCOLOGIC HISTORY:    Kappa light chain myeloma ([place])   06/16/2018 Initial Diagnosis    Kappa light chain myeloma ([place])    06/17/2018 -  Chemotherapy    The patient had palonosetron (ALOXI) injection 0.25 mg, 0.25 mg, Intravenous,  Once, 5 of 7 cycles Administration: 0.25 mg (06/17/2018), 0.25 mg (06/23/2018), 0.25 mg (06/30/2018), 0.25 mg (07/07/2018), 0.25 mg (07/14/2018) bortezomib SQ (VELCADE) chemo injection 2.5 mg, 1.3 mg/m2 = 2.5 mg, Subcutaneous,  Once, 5 of 7 cycles Administration: 2.5 mg (06/17/2018), 2.5 mg (06/23/2018), 2.5 mg (06/30/2018), 2.5 mg (07/07/2018), 2.5 mg (07/14/2018) cyclophosphamide (CYTOXAN) 300 mg in sodium chloride 0.9 % 250 mL chemo infusion, 300 mg (100 % of original dose 300 mg), Intravenous,  Once, 5 of 7 cycles Dose modification: 300 mg (original dose 300 mg, Cycle 1, Reason: Change in SCr/CrCl), 500 mg (original dose 500 mg, Cycle 2, Reason: Provider Judgment), 500 mg (original dose 500 mg, Cycle 3, Reason: Change in SCr/CrCl) Administration: 300 mg (06/17/2018), 500 mg (06/23/2018), 500 mg (06/30/2018), 600 mg (07/07/2018), 600 mg (07/14/2018)  for chemotherapy treatment.       CANCER STAGING: Cancer Staging No matching staging information was found for the patient.   INTERVAL HISTORY:  [name] 58 y.o. male returns for routine follow-up and consideration for next cycle of chemotherapy. [name] is here today alone. [name] states that [name] has had a recent ER visit and hospitalization this past weekend for rectal bleeding. [name] states that [name] has trouble sleeping due to pain. [name] states that [name] constipation has gotten better since starting the benifiber twice daily. [name] states that the itching has gotten a little better  since [name] last visit. Denies any nausea, vomiting, or diarrhea. Denies any new pains. Had not noticed any recent bleeding such as epistaxis, hematuria or hematochezia. Denies recent chest pain on exertion, shortness of breath on minimal exertion, pre-syncopal episodes, or palpitations. Denies any numbness or tingling in hands or feet. Denies any recent fevers, or infections. Patient reports appetite at 100% and energy level at 25%.     REVIEW OF SYSTEMS:  Review of Systems  Cardiovascular: Positive for palpitations.  Gastrointestinal: Positive for constipation.  Neurological: Positive for headaches and numbness.  Psychiatric/Behavioral: Positive for sleep disturbance.     PAST MEDICAL/SURGICAL HISTORY:  Past Medical History:  Diagnosis Date  . Allergy   . Arthritis    neck and back  . Blood transfusion without reported diagnosis   . BPH (benign prostatic hyperplasia)   . Cancer [facility]) 2004   testicle  . Chronic kidney disease    kidney stones  . Left lumbar radiculopathy 06/12/2016  . 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 Medical history non-contributory    Pt has scattered thoughts and uncertain of past medical history  . 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 Stones, urinary tract   . Substance abuse ([place])    prescribed oxydcodone- 10 years   Past Surgical History:  Procedure Laterality Date  . BACK SURGERY     x5  . CERVICAL DISC SURGERY     x2  . COLONOSCOPY WITH PROPOFOL N/A 08/11/2016   Procedure: COLONOSCOPY WITH PROPOFOL;  Surgeon: [name], MD;  Location: [facility];  Service: Endoscopy;  Laterality: N/A;  130   . POLYPECTOMY  08/11/2016   Procedure: POLYPECTOMY;  Surgeon: [name], MD;  Location: [facility];  Service: Endoscopy;;  colon  . testicular cancer  2004  . TONSILLECTOMY       SOCIAL HISTORY:  Social History   Socioeconomic History  . Marital status: Single    Spouse name: Not on file  . Number of children: 1  . Years of education:  64  . Highest education level: Not on file  Occupational History  . Occupation: retired    Comment: Psychologist, prison and probation services  . Occupation: disabled  Social Needs  . Financial resource strain: Not on file  . Food insecurity:    Worry: Not on file    Inability: Not on file  . Transportation needs:    Medical: Not on file    Non-medical: Not on file  Tobacco Use  . Smoking status: Current Every Day Smoker    Packs/day: 1.00    Years: 40.00    Pack years: 40.00    Types: Cigarettes    Start date: 03/31/1974  . Smokeless tobacco: Never Used  Substance and Sexual Activity  . Alcohol use: Yes    Comment: Occasional  . Drug use: No    Types: Cocaine    Comment: Remote hx of cocaine, quit 1989  . Sexual activity: Yes  Lifestyle  . Physical activity:    Days per week: Not on file    Minutes per session: Not on file  . Stress: Not on file  Relationships  . Social connections:    Talks on phone: Not on file    Gets together: Not on file    Attends religious service: Not on file    Active member of club or organization: Not on file    Attends meetings of clubs or organizations: Not on file    Relationship status: Not on file  . Intimate partner violence:    Fear of current or ex partner: Not on file    Emotionally abused: Not on file    Physically abused: Not on file    Forced sexual activity: Not on file  Other Topics Concern  . Not on file  Social History Narrative   [employer] for 12 years   [employer] for 45 years      Never married   One daughter      Lives alone   Retail banker   Right-handed   Occasional caffeine use          FAMILY HISTORY:  Family History  Problem Relation Age of Onset  . COPD Mother   . Heart disease Mother   . Other Father        Never knew [name] father.  . Thyroid disease Sister   . Arthritis Sister   . Heart disease Sister        bypass  . Colon cancer Neg Hx     CURRENT MEDICATIONS:  Outpatient Encounter Medications as of  07/21/2018  Medication Sig  . acyclovir (ZOVIRAX) 200 MG capsule Take 200 mg by mouth 2 (two) times daily.  . bortezomib IV (VELCADE) 3.5 MG injection Inject 1.3 mg/m2 into the vein once a week.   . CYCLOPHOSPHAMIDE IV Inject into the vein once a week.  . diphenhydrAMINE (BENADRYL) 25 MG tablet Take 1 tablet (25 mg total) by mouth as needed for itching.  Marland Kitchen HYDROmorphone (DILAUDID) 2 MG tablet Take  0.5 tablets (1 mg total) by mouth every 8 (eight) hours as needed for severe pain. (Patient taking differently: Take 2 mg by mouth every 8 (eight) hours as needed for severe pain. )  . Misc. Devices MISC Please provide patient with rollaider.  Dx: multiple myeloma C90.0  . senna-docusate (SENOKOT-S) 8.6-50 MG tablet Take 2 tablets by mouth 2 (two) times daily for 30 days.  Marland Kitchen torsemide (DEMADEX) 20 MG tablet Take 2 tablets (40 mg total) by mouth daily for 30 days.   No facility-administered encounter medications on file as of 07/21/2018.     ALLERGIES:  Allergies  Allergen Reactions  . Acetaminophen Nausea Only and Other (See Comments)    Elevated liver enzymes  . Cymbalta [Duloxetine Hcl] Swelling    Facial swelling  . Diclofenac Sodium Swelling       . Imodium [Loperamide] Swelling    facial  . Lyrica [Pregabalin] Swelling  . Morphine Other (See Comments)    Bradycardia   . Vioxx [Rofecoxib] Swelling  . Aleve [Naproxen] Swelling    eye     PHYSICAL EXAM:  ECOG Performance status: 2  Vitals:   07/21/18 0915  BP: (!) 121/50  Pulse: (!) 110  Resp: 16  Temp: 98.9 F (37.2 C)  SpO2: 100%   Filed Weights   07/21/18 0915  Weight: 157 lb 4.8 oz (71.4 kg)    Physical Exam Vitals signs reviewed.  Constitutional:      Appearance: Normal appearance.  Cardiovascular:     Rate and Rhythm: Normal rate and regular rhythm.     Heart sounds: Normal heart sounds.  Pulmonary:     Effort: Pulmonary effort is normal.     Breath sounds: Normal breath sounds.  Abdominal:     General:  There is no distension.     Palpations: Abdomen is soft. There is no mass.  Musculoskeletal:        General: No swelling.  Skin:    General: Skin is warm.  Neurological:     General: No focal deficit present.     Mental Status: [name] is alert and oriented to person, place, and time.  Psychiatric:        Mood and Affect: Mood normal.        Behavior: Behavior normal.      LABORATORY DATA:  I have reviewed the labs as listed.  CBC    Component Value Date/Time   WBC 4.4 07/21/2018 0848   RBC 2.43 (L) 07/21/2018 0848   HGB 8.0 (L) 07/21/2018 0848   HCT 25.1 (L) 07/21/2018 0848   PLT 186 07/21/2018 0848   MCV 103.3 (H) 07/21/2018 0848   MCH 32.9 07/21/2018 0848   MCHC 31.9 07/21/2018 0848   RDW 20.4 (H) 07/21/2018 0848   LYMPHSABS 0.8 07/21/2018 0848   MONOABS 1.1 (H) 07/21/2018 0848   EOSABS 0.5 07/21/2018 0848   BASOSABS 0.0 07/21/2018 0848   CMP Latest Ref Rng & Units 07/21/2018 07/16/2018 07/15/2018  Glucose 70 - 99 mg/dL 99 74 94  BUN 6 - 20 mg/dL 51(H) 65(H) 66(H)  Creatinine 0.61 - 1.24 mg/dL 4.52(H) 5.20(H) 5.16(H)  Sodium 135 - 145 mmol/L 139 137 136  Potassium 3.5 - 5.1 mmol/L 4.7 4.4 4.3  Chloride 98 - 111 mmol/L 108 107 105  CO2 22 - 32 mmol/L 19(L) 19(L) 18(L)  Calcium 8.9 - 10.3 mg/dL 7.1(L) 6.5(L) 6.5(L)  Total Protein 6.5 - 8.1 g/dL 7.2 6.4(L) 7.0  Total Bilirubin 0.3 - 1.2 mg/dL  0.6 0.6 0.7  Alkaline Phos 38 - 126 U/L 48 49 51  AST 15 - 41 U/L 15 16 16   ALT 0 - 44 U/L 13 13 14        DIAGNOSTIC IMAGING:  I have independently reviewed the scans and discussed with the patient.   I have reviewed Venita Lick LPN's note and agree with the documentation.  I personally performed a face-to-face visit, made revisions and my assessment and plan is as follows.    ASSESSMENT & PLAN:   Multiple myeloma not having achieved remission ([place]) 1.  Kappa light chain myeloma: - Presentation with acute renal failure and hypercalcemia. -M spike of 0.4 g.  Serum  immunofixation did not show any immunoglobulin heavy chain.  Beta-2 microglobulin of 32.8.  Kappa free light chains of 12,085 and free light chain ratio of 1981.  LDH of 224. - Started on weekly CyBorD on 06/17/2018. 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  Cytogenetic analysis shows complex chromosome aberrations.  Gain of 1 q. was seen.  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 minus; 17p- - Week 5 of treatment was on 07/14/2018. -Creatinine improved to 4.5. -[name] may proceed with Velcade and Cytoxan today.  I reviewed myeloma panel.  [name] kappa light chains improved to 9425 from 12085.  M spike improved to 0.2 g from 0.4 g.  Free light chain ratio has improved to 1273 from 1981. -[name] was admitted to the hospital from 07/15/2018 through 07/17/2018 with rectal bleed.  GI saw and thought it was from internal hemorrhoids.  Hemoglobin was 6.6 and received 1 unit of PRBC. -[name] hemoglobin today is 8.  We talked about starting [name] on Procrit for anemia secondary to CKD.  We discussed the side effects in detail. - I will reevaluate [name] in 1 week.   2.  Hypercalcemia: - Status post denosumab on 06/17/2018.  [name] was also treated with calcitonin twice daily while inpatient. -Calcium today 6.6.  [name] was instructed to take calcium supplements twice daily.  3.  ID prophylaxis: -Acyclovir 200 mg twice daily started on 06/30/2018.  As [name] started itching all over the body, [name] acyclovir was discontinued on 07/07/2018. -[name] is also taking aspirin 81 mg daily. -We will consider restarting it at some point.   4.  Low back pain: -She did develop a rash with itching with oxycodone and hydrocodone. -Dilaudid 2 mg every 8 hours is not completely helping. -I have told [name] to increase it to 4 mg every 8 hours.  [name] will take lactulose for constipation.  5.  Lower extremity edema: -This has improved quite well.  [name] will take torsemide 40 mg daily.    Total time spent is 40 minutes with more than 50% of the  time spent face-to-face discussing treatment plan, management of pain medication, starting of Procrit, side effect discussion and coordination of care.    Orders placed this encounter:  No orders of the defined types were placed in this encounter.     Derek Jack, MD [place] [phone]

## 2018-07-21 NOTE — Progress Notes (Signed)
Multiple attempts to obtain an iv access today.Patient request to come back tomorrow and try again. Encouraged patient to drink more water.   Vitals stable and discharged home from clinic via wheelchair. Follow up as scheduled.

## 2018-07-21 NOTE — Assessment & Plan Note (Signed)
1.  Kappa light chain myeloma: - Presentation with acute renal failure and hypercalcemia. -M spike of 0.4 g.  Serum immunofixation did not show any immunoglobulin heavy chain.  Beta-2 microglobulin of 32.8.  Kappa free light chains of 12,085 and free light chain ratio of 1981.  LDH of 224. - Started on weekly CyBorD on 06/17/2018. 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  Cytogenetic analysis shows complex chromosome aberrations.  Gain of 1 q. was seen.  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 minus; 17p- - Week 5 of treatment was on 07/14/2018. -Creatinine improved to 4.5. -He may proceed with Velcade and Cytoxan today.  I reviewed myeloma panel.  His kappa light chains improved to 9425 from 12085.  M spike improved to 0.2 g from 0.4 g.  Free light chain ratio has improved to 1273 from 1981. -He was admitted to the hospital from 07/15/2018 through 07/17/2018 with rectal bleed.  GI saw and thought it was from internal hemorrhoids.  Hemoglobin was 6.6 and received 1 unit of PRBC. -His hemoglobin today is 8.  We talked about starting him on Procrit for anemia secondary to CKD.  We discussed the side effects in detail. - I will reevaluate him in 1 week.   2.  Hypercalcemia: - Status post denosumab on 06/17/2018.  He was also treated with calcitonin twice daily while inpatient. -Calcium today 6.6.  He was instructed to take calcium supplements twice daily.  3.  ID prophylaxis: -Acyclovir 200 mg twice daily started on 06/30/2018.  As he started itching all over the body, his acyclovir was discontinued on 07/07/2018. -He is also taking aspirin 81 mg daily. -We will consider restarting it at some point.   4.  Low back pain: -She did develop a rash with itching with oxycodone and hydrocodone. -Dilaudid 2 mg every 8 hours is not completely helping. -I have told him to increase it to 4 mg every 8 hours.  He will take lactulose for constipation.  5.  Lower extremity edema: -This has  improved quite well.  He will take torsemide 40 mg daily.

## 2018-07-21 NOTE — Patient Instructions (Addendum)
[place] at [facility] Discharge Instructions  You were seen today by Dr. Delton Coombes. He went over your recent lab results. He would like you to start taking 2 tablets of pain medication every 8 hours. Your myeloma numbers are looking better, once we get your myeloma under control we will send you back to your back surgeon. He will start you on an injection called Procrit to help keep your blood counts up. He will see you back in 1 week for labs and follow up.   Thank you for choosing [place] at [facility] to provide your oncology and hematology care.  To afford each patient quality time with our provider, please arrive at least 15 minutes before your scheduled appointment time.   If you have a lab appointment with the [facility] please come in thru the  Main Entrance and check in at the main information desk  You need to re-schedule your appointment should you arrive 10 or more minutes late.  We strive to give you quality time with our providers, and arriving late affects you and other patients whose appointments are after yours.  Also, if you no show three or more times for appointments you may be dismissed from the clinic at the providers discretion.     Again, thank you for choosing [facility].  Our hope is that these requests will decrease the amount of time that you wait before being seen by our physicians.       _____________________________________________________________  Should you have questions after your visit to [facility], please contact our office at [phone] between the hours of 8:00 a.m. and 4:30 p.m.  Voicemails left after 4:00 p.m. will not be returned until the following business day.  For prescription refill requests, have your pharmacy contact our office and allow 72 hours.    Cancer Center Support Programs:   > Cancer Support Group  2nd Tuesday of the month 1pm-2pm, Journey Room

## 2018-07-22 ENCOUNTER — Inpatient Hospital Stay (HOSPITAL_COMMUNITY): Payer: Medicare Other

## 2018-07-22 VITALS — BP 111/56 | HR 95 | Temp 98.4°F | Resp 18

## 2018-07-22 DIAGNOSIS — Z5112 Encounter for antineoplastic immunotherapy: Secondary | ICD-10-CM | POA: Diagnosis not present

## 2018-07-22 DIAGNOSIS — C9000 Multiple myeloma not having achieved remission: Secondary | ICD-10-CM

## 2018-07-22 DIAGNOSIS — N185 Chronic kidney disease, stage 5: Secondary | ICD-10-CM

## 2018-07-22 MED ORDER — SODIUM CHLORIDE 0.9 % IV SOLN
20.0000 mg | Freq: Once | INTRAVENOUS | Status: AC
  Administered 2018-07-22: 20 mg via INTRAVENOUS
  Filled 2018-07-22: qty 20

## 2018-07-22 MED ORDER — EPOETIN ALFA-EPBX 10000 UNIT/ML IJ SOLN
INTRAMUSCULAR | Status: AC
  Filled 2018-07-22: qty 2

## 2018-07-22 MED ORDER — SODIUM CHLORIDE 0.9 % IV SOLN
300.0000 mg/m2 | Freq: Once | INTRAVENOUS | Status: AC
  Administered 2018-07-22: 600 mg via INTRAVENOUS
  Filled 2018-07-22: qty 30

## 2018-07-22 MED ORDER — EPOETIN ALFA-EPBX 10000 UNIT/ML IJ SOLN
20000.0000 [IU] | Freq: Once | INTRAMUSCULAR | Status: AC
  Administered 2018-07-22: 11:00:00 20000 [IU] via SUBCUTANEOUS

## 2018-07-22 MED ORDER — PALONOSETRON HCL INJECTION 0.25 MG/5ML
0.2500 mg | Freq: Once | INTRAVENOUS | Status: AC
  Administered 2018-07-22: 0.25 mg via INTRAVENOUS

## 2018-07-22 MED ORDER — BORTEZOMIB CHEMO SQ INJECTION 3.5 MG (2.5MG/ML)
1.3000 mg/m2 | Freq: Once | INTRAMUSCULAR | Status: AC
  Administered 2018-07-22: 2.5 mg via SUBCUTANEOUS
  Filled 2018-07-22: qty 1

## 2018-07-22 MED ORDER — SODIUM CHLORIDE 0.9 % IV SOLN
Freq: Once | INTRAVENOUS | Status: AC
  Administered 2018-07-22: 08:00:00 via INTRAVENOUS

## 2018-07-22 MED ORDER — PALONOSETRON HCL INJECTION 0.25 MG/5ML
INTRAVENOUS | Status: AC
  Filled 2018-07-22: qty 5

## 2018-07-22 NOTE — Patient Instructions (Signed)
[facility] Discharge Instructions for Patients Receiving Chemotherapy  Today you received the following chemotherapy agents   To help prevent nausea and vomiting after your treatment, we encourage you to take your nausea medication   If you develop nausea and vomiting that is not controlled by your nausea medication, call the clinic.   BELOW ARE SYMPTOMS THAT SHOULD BE REPORTED IMMEDIATELY:  *FEVER GREATER THAN 100.5 F  *CHILLS WITH OR WITHOUT FEVER  NAUSEA AND VOMITING THAT IS NOT CONTROLLED WITH YOUR NAUSEA MEDICATION  *UNUSUAL SHORTNESS OF BREATH  *UNUSUAL BRUISING OR BLEEDING  TENDERNESS IN MOUTH AND THROAT WITH OR WITHOUT PRESENCE OF ULCERS  *URINARY PROBLEMS  *BOWEL PROBLEMS  UNUSUAL RASH Items with * indicate a potential emergency and should be followed up as soon as possible.  Feel free to call the clinic should you have any questions or concerns. The clinic phone number is [phone].  Please show the CHEMO ALERT CARD at check-in to the Emergency Department and triage nurse.   

## 2018-07-29 ENCOUNTER — Inpatient Hospital Stay (HOSPITAL_COMMUNITY): Payer: Medicare Other

## 2018-07-29 ENCOUNTER — Inpatient Hospital Stay (HOSPITAL_BASED_OUTPATIENT_CLINIC_OR_DEPARTMENT_OTHER): Payer: Medicare Other | Admitting: Hematology

## 2018-07-29 ENCOUNTER — Other Ambulatory Visit: Payer: Self-pay

## 2018-07-29 ENCOUNTER — Encounter (HOSPITAL_COMMUNITY): Payer: Self-pay | Admitting: Hematology

## 2018-07-29 VITALS — BP 103/52 | HR 73 | Temp 98.3°F | Resp 18

## 2018-07-29 VITALS — BP 120/59 | HR 93 | Temp 98.4°F | Resp 18 | Wt 153.0 lb

## 2018-07-29 DIAGNOSIS — C9000 Multiple myeloma not having achieved remission: Secondary | ICD-10-CM

## 2018-07-29 DIAGNOSIS — E875 Hyperkalemia: Secondary | ICD-10-CM

## 2018-07-29 DIAGNOSIS — M545 Low back pain: Secondary | ICD-10-CM | POA: Diagnosis not present

## 2018-07-29 DIAGNOSIS — N185 Chronic kidney disease, stage 5: Secondary | ICD-10-CM

## 2018-07-29 DIAGNOSIS — N189 Chronic kidney disease, unspecified: Secondary | ICD-10-CM | POA: Diagnosis not present

## 2018-07-29 DIAGNOSIS — D631 Anemia in chronic kidney disease: Secondary | ICD-10-CM

## 2018-07-29 DIAGNOSIS — Z5112 Encounter for antineoplastic immunotherapy: Secondary | ICD-10-CM | POA: Diagnosis not present

## 2018-07-29 LAB — COMPREHENSIVE METABOLIC PANEL
ALT: 11 U/L (ref 0–44)
AST: 13 U/L — ABNORMAL LOW (ref 15–41)
Albumin: 4.1 g/dL (ref 3.5–5.0)
Alkaline Phosphatase: 56 U/L (ref 38–126)
Anion gap: 12 (ref 5–15)
BUN: 42 mg/dL — ABNORMAL HIGH (ref 6–20)
CO2: 19 mmol/L — ABNORMAL LOW (ref 22–32)
Calcium: 7.1 mg/dL — ABNORMAL LOW (ref 8.9–10.3)
Chloride: 105 mmol/L (ref 98–111)
Creatinine, Ser: 4.31 mg/dL — ABNORMAL HIGH (ref 0.61–1.24)
GFR calc Af Amer: 16 mL/min — ABNORMAL LOW (ref >60)
GFR calc non Af Amer: 14 mL/min — ABNORMAL LOW (ref >60)
Glucose, Bld: 86 mg/dL (ref 70–99)
Potassium: 5.7 mmol/L — ABNORMAL HIGH (ref 3.5–5.1)
Sodium: 136 mmol/L (ref 135–145)
Total Bilirubin: 0.7 mg/dL (ref 0.3–1.2)
Total Protein: 7 g/dL (ref 6.5–8.1)

## 2018-07-29 LAB — CBC WITH DIFFERENTIAL/PLATELET
Abs Immature Granulocytes: 0.08 10*3/uL — ABNORMAL HIGH (ref 0.00–0.07)
Basophils Absolute: 0.1 10*3/uL (ref 0.0–0.1)
Basophils Relative: 1 %
Eosinophils Absolute: 0.1 10*3/uL (ref 0.0–0.5)
Eosinophils Relative: 2 %
HCT: 24.9 % — ABNORMAL LOW (ref 39.0–52.0)
Hemoglobin: 7.8 g/dL — ABNORMAL LOW (ref 13.0–17.0)
Immature Granulocytes: 1 %
Lymphocytes Relative: 15 %
Lymphs Abs: 0.9 10*3/uL (ref 0.7–4.0)
MCH: 32.8 pg (ref 26.0–34.0)
MCHC: 31.3 g/dL (ref 30.0–36.0)
MCV: 104.6 fL — ABNORMAL HIGH (ref 80.0–100.0)
Monocytes Absolute: 1.4 10*3/uL — ABNORMAL HIGH (ref 0.1–1.0)
Monocytes Relative: 22 %
Neutro Abs: 3.6 10*3/uL (ref 1.7–7.7)
Neutrophils Relative %: 59 %
Platelets: 201 10*3/uL (ref 150–400)
RBC: 2.38 MIL/uL — ABNORMAL LOW (ref 4.22–5.81)
RDW: 20.1 % — ABNORMAL HIGH (ref 11.5–15.5)
WBC: 6.1 10*3/uL (ref 4.0–10.5)
nRBC: 0 % (ref 0.0–0.2)

## 2018-07-29 LAB — MAGNESIUM: Magnesium: 2.1 mg/dL (ref 1.7–2.4)

## 2018-07-29 MED ORDER — PALONOSETRON HCL INJECTION 0.25 MG/5ML
0.2500 mg | Freq: Once | INTRAVENOUS | Status: AC
  Administered 2018-07-29: 0.25 mg via INTRAVENOUS
  Filled 2018-07-29: qty 5

## 2018-07-29 MED ORDER — INSULIN REGULAR HUMAN 100 UNIT/ML IJ SOLN
10.0000 [IU] | Freq: Once | INTRAMUSCULAR | Status: AC
  Administered 2018-07-29: 13:00:00 10 [IU] via SUBCUTANEOUS
  Filled 2018-07-29: qty 10

## 2018-07-29 MED ORDER — SODIUM CHLORIDE 0.9 % IV SOLN
300.0000 mg/m2 | Freq: Once | INTRAVENOUS | Status: AC
  Administered 2018-07-29: 600 mg via INTRAVENOUS
  Filled 2018-07-29: qty 30

## 2018-07-29 MED ORDER — DEXTROSE 50 % IV SOLN
50.0000 mL | Freq: Once | INTRAVENOUS | Status: AC
  Administered 2018-07-29: 50 mL via INTRAVENOUS
  Filled 2018-07-29: qty 50

## 2018-07-29 MED ORDER — SODIUM CHLORIDE 0.9 % IV SOLN
Freq: Once | INTRAVENOUS | Status: AC
  Administered 2018-07-29: 11:00:00 via INTRAVENOUS

## 2018-07-29 MED ORDER — HYDROMORPHONE HCL 4 MG PO TABS: 4.0000 mg | ORAL_TABLET | Freq: Three times a day (TID) | ORAL | 0 refills | Status: DC | PRN

## 2018-07-29 MED ORDER — SODIUM CHLORIDE 0.9 % IV SOLN
20.0000 mg | Freq: Once | INTRAVENOUS | Status: AC
  Administered 2018-07-29: 20 mg via INTRAVENOUS
  Filled 2018-07-29: qty 20

## 2018-07-29 MED ORDER — BORTEZOMIB CHEMO SQ INJECTION 3.5 MG (2.5MG/ML)
1.3000 mg/m2 | Freq: Once | INTRAMUSCULAR | Status: AC
  Administered 2018-07-29: 2.5 mg via SUBCUTANEOUS
  Filled 2018-07-29: qty 1

## 2018-07-29 MED ORDER — EPOETIN ALFA-EPBX 10000 UNIT/ML IJ SOLN
20000.0000 [IU] | Freq: Once | INTRAMUSCULAR | Status: AC
  Administered 2018-07-29: 20000 [IU] via SUBCUTANEOUS
  Filled 2018-07-29: qty 2

## 2018-07-29 NOTE — Progress Notes (Signed)
[address], [place] [address]   CLINIC:  Medical Oncology/Hematology  PCP:  Rosita Fire, MD [place] [address] [phone]   REASON FOR VISIT:  Follow-up for multiple myeloma   BRIEF ONCOLOGIC HISTORY:    Kappa light chain myeloma ([place])   06/16/2018 Initial Diagnosis    Kappa light chain myeloma ([place])    06/17/2018 -  Chemotherapy    The patient had palonosetron (ALOXI) injection 0.25 mg, 0.25 mg, Intravenous,  Once, 6 of 10 cycles Administration: 0.25 mg (06/17/2018), 0.25 mg (06/23/2018), 0.25 mg (06/30/2018), 0.25 mg (07/07/2018), 0.25 mg (07/14/2018), 0.25 mg (07/22/2018) bortezomib SQ (VELCADE) chemo injection 2.5 mg, 1.3 mg/m2 = 2.5 mg, Subcutaneous,  Once, 6 of 10 cycles Administration: 2.5 mg (06/17/2018), 2.5 mg (06/23/2018), 2.5 mg (06/30/2018), 2.5 mg (07/07/2018), 2.5 mg (07/14/2018), 2.5 mg (07/22/2018) cyclophosphamide (CYTOXAN) 300 mg in sodium chloride 0.9 % 250 mL chemo infusion, 300 mg (100 % of original dose 300 mg), Intravenous,  Once, 6 of 10 cycles Dose modification: 300 mg (original dose 300 mg, Cycle 1, Reason: Change in SCr/CrCl), 500 mg (original dose 500 mg, Cycle 2, Reason: Provider Judgment), 500 mg (original dose 500 mg, Cycle 3, Reason: Change in SCr/CrCl) Administration: 300 mg (06/17/2018), 500 mg (06/23/2018), 500 mg (06/30/2018), 600 mg (07/07/2018), 600 mg (07/14/2018), 600 mg (07/22/2018)  for chemotherapy treatment.       CANCER STAGING: Cancer Staging No matching staging information was found for the patient.   INTERVAL HISTORY:  Mr. Wenger 58 y.o. male returns for routine follow-up and consideration for next cycle of chemotherapy. He is here today alone. He states that he continues to experience constipation. He states that his pain has been more controlled since his last visit.  Denies any nausea, vomiting, or diarrhea. Denies any new pains. Had not noticed any recent bleeding such as  epistaxis, hematuria or hematochezia. Denies recent chest pain on exertion, shortness of breath on minimal exertion, pre-syncopal episodes, or palpitations. Denies any numbness or tingling in hands or feet. Denies any recent fevers, infections, or recent hospitalizations. Patient reports appetite at 100% and energy level at 75%.     REVIEW OF SYSTEMS:  Review of Systems  Constitutional: Positive for fatigue.  Respiratory: Positive for cough.   Cardiovascular: Positive for leg swelling.  Gastrointestinal: Positive for constipation.  Skin: Positive for itching.  Neurological: Positive for dizziness and numbness.     PAST MEDICAL/SURGICAL HISTORY:  Past Medical History:  Diagnosis Date  . Allergy   . Arthritis    neck and back  . Blood transfusion without reported diagnosis   . BPH (benign prostatic hyperplasia)   . Cancer [facility]) 2004   testicle  . Chronic kidney disease    kidney stones  . Left lumbar radiculopathy 06/12/2016  . 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 Medical history non-contributory    Pt has scattered thoughts and uncertain of past medical history  . 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 Stones, urinary tract   . Substance abuse ([place])    prescribed oxydcodone- 10 years   Past Surgical History:  Procedure Laterality Date  . BACK SURGERY     x5  . CERVICAL DISC SURGERY     x2  . COLONOSCOPY WITH PROPOFOL N/A 08/11/2016   Procedure: COLONOSCOPY WITH PROPOFOL;  Surgeon: [name], MD;  Location: [facility];  Service: Endoscopy;  Laterality: N/A;  130   . POLYPECTOMY  08/11/2016  Procedure: POLYPECTOMY;  Surgeon: [name], MD;  Location: [facility] SUITE;  Service: Endoscopy;;  colon  . testicular cancer  2004  . TONSILLECTOMY       SOCIAL HISTORY:  Social History   Socioeconomic History  . Marital status: Single    Spouse name: Not on file  . Number of children: 1  . Years of education: 32  . Highest education level: Not on file   Occupational History  . Occupation: retired    Comment: Psychologist, prison and probation services  . Occupation: disabled  Social Needs  . Financial resource strain: Not on file  . Food insecurity:    Worry: Not on file    Inability: Not on file  . Transportation needs:    Medical: Not on file    Non-medical: Not on file  Tobacco Use  . Smoking status: Current Every Day Smoker    Packs/day: 1.00    Years: 40.00    Pack years: 40.00    Types: Cigarettes    Start date: 03/31/1974  . Smokeless tobacco: Never Used  Substance and Sexual Activity  . Alcohol use: Yes    Comment: Occasional  . Drug use: No    Types: Cocaine    Comment: Remote hx of cocaine, quit 1989  . Sexual activity: Yes  Lifestyle  . Physical activity:    Days per week: Not on file    Minutes per session: Not on file  . Stress: Not on file  Relationships  . Social connections:    Talks on phone: Not on file    Gets together: Not on file    Attends religious service: Not on file    Active member of club or organization: Not on file    Attends meetings of clubs or organizations: Not on file    Relationship status: Not on file  . Intimate partner violence:    Fear of current or ex partner: Not on file    Emotionally abused: Not on file    Physically abused: Not on file    Forced sexual activity: Not on file  Other Topics Concern  . Not on file  Social History Narrative   [employer] for 12 years   [employer] for 36 years      Never married   One daughter      Lives alone   Retail banker   Right-handed   Occasional caffeine use          FAMILY HISTORY:  Family History  Problem Relation Age of Onset  . COPD Mother   . Heart disease Mother   . Other Father        Never knew his father.  . Thyroid disease Sister   . Arthritis Sister   . Heart disease Sister        bypass  . Colon cancer Neg Hx     CURRENT MEDICATIONS:  Outpatient Encounter Medications as of 07/29/2018  Medication Sig  . bortezomib IV  (VELCADE) 3.5 MG injection Inject 1.3 mg/m2 into the vein once a week.   . CYCLOPHOSPHAMIDE IV Inject into the vein once a week.  . diphenhydrAMINE (BENADRYL) 25 MG tablet Take 1 tablet (25 mg total) by mouth as needed for itching.  Marland Kitchen HYDROmorphone (DILAUDID) 4 MG tablet Take 1 tablet (4 mg total) by mouth every 8 (eight) hours as needed for severe pain.  . Misc. Devices MISC Please provide patient with rollaider.  Dx: multiple myeloma C90.0  .  protein supplement (RESOURCE BENEPROTEIN) POWD Take 1 Scoop by mouth 3 (three) times daily with meals.  . senna (SENOKOT) 8.6 MG tablet Take 2 tablets by mouth 2 (two) times daily.  Marland Kitchen torsemide (DEMADEX) 20 MG tablet Take 2 tablets (40 mg total) by mouth daily for 30 days.  Marland Kitchen acyclovir (ZOVIRAX) 200 MG capsule Take 200 mg by mouth 2 (two) times daily.   No facility-administered encounter medications on file as of 07/29/2018.     ALLERGIES:  Allergies  Allergen Reactions  . Acetaminophen Nausea Only and Other (See Comments)    Elevated liver enzymes  . Cymbalta [Duloxetine Hcl] Swelling    Facial swelling  . Diclofenac Sodium Swelling       . Imodium [Loperamide] Swelling    facial  . Lyrica [Pregabalin] Swelling  . Morphine Other (See Comments)    Bradycardia   . Vioxx [Rofecoxib] Swelling  . Aleve [Naproxen] Swelling    eye     PHYSICAL EXAM:  ECOG Performance status: 1  Vitals:   07/29/18 0835  BP: (!) 120/59  Pulse: 93  Resp: 18  Temp: 98.4 F (36.9 C)  SpO2: 99%   Filed Weights   07/29/18 0835  Weight: 153 lb (69.4 kg)    Physical Exam Vitals signs reviewed.  Constitutional:      Appearance: Normal appearance.  Cardiovascular:     Rate and Rhythm: Normal rate and regular rhythm.     Heart sounds: Normal heart sounds.  Pulmonary:     Effort: Pulmonary effort is normal.     Breath sounds: Normal breath sounds.  Abdominal:     General: There is no distension.     Palpations: Abdomen is soft. There is no mass.   Musculoskeletal:        General: No swelling.  Skin:    General: Skin is warm.  Neurological:     General: No focal deficit present.     Mental Status: He is alert and oriented to person, place, and time.  Psychiatric:        Mood and Affect: Mood normal.        Behavior: Behavior normal.      LABORATORY DATA:  I have reviewed the labs as listed.  CBC    Component Value Date/Time   WBC 6.1 07/29/2018 0815   RBC 2.38 (L) 07/29/2018 0815   HGB 7.8 (L) 07/29/2018 0815   HCT 24.9 (L) 07/29/2018 0815   PLT 201 07/29/2018 0815   MCV 104.6 (H) 07/29/2018 0815   MCH 32.8 07/29/2018 0815   MCHC 31.3 07/29/2018 0815   RDW 20.1 (H) 07/29/2018 0815   LYMPHSABS 0.9 07/29/2018 0815   MONOABS 1.4 (H) 07/29/2018 0815   EOSABS 0.1 07/29/2018 0815   BASOSABS 0.1 07/29/2018 0815   CMP Latest Ref Rng & Units 07/29/2018 07/21/2018 07/16/2018  Glucose 70 - 99 mg/dL 86 99 74  BUN 6 - 20 mg/dL 42(H) 51(H) 65(H)  Creatinine 0.61 - 1.24 mg/dL 4.31(H) 4.52(H) 5.20(H)  Sodium 135 - 145 mmol/L 136 139 137  Potassium 3.5 - 5.1 mmol/L 5.7(H) 4.7 4.4  Chloride 98 - 111 mmol/L 105 108 107  CO2 22 - 32 mmol/L 19(L) 19(L) 19(L)  Calcium 8.9 - 10.3 mg/dL 7.1(L) 7.1(L) 6.5(L)  Total Protein 6.5 - 8.1 g/dL 7.0 7.2 6.4(L)  Total Bilirubin 0.3 - 1.2 mg/dL 0.7 0.6 0.6  Alkaline Phos 38 - 126 U/L 56 48 49  AST 15 - 41 U/L 13(L) 15 16  ALT 0 - 44 U/L 11 13 13        DIAGNOSTIC IMAGING:  I have independently reviewed the scans and discussed with the patient.   I have reviewed Venita Lick LPN's note and agree with the documentation.  I personally performed a face-to-face visit, made revisions and my assessment and plan is as follows.    ASSESSMENT & PLAN:   Kappa light chain myeloma ([place]) 1.  Kappa light chain myeloma: - Presentation with acute renal failure and hypercalcemia. -M spike of 0.4 g.  Serum immunofixation did not show any immunoglobulin heavy chain.  Beta-2 microglobulin of 32.8.   Kappa free light chains of 12,085 and free light chain ratio of 1981.  LDH of 224. - Started on weekly CyBorD on 06/17/2018. 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  Cytogenetic analysis shows complex chromosome aberrations.  Gain of 1 q. was seen.  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 minus; 17p- - Week 6 of treatment on 07/22/2018.  Creatinine improved to 4.3. -He may proceed with Velcade and Cytoxan today. -Kappa light chains improved to 9425.  M spike improved to 0.2 g.  Free light chain ratio has improved to 1273. -Procrit 20,000 units was started on 07/22/2018.  He will continue it weekly. - I will obtain another free light chains and SPEP next week.   2.  Hypercalcemia: - Status post denosumab on 06/17/2018.  He was also treated with calcitonin twice daily while inpatient. -Calcium today 7.1.  He was instructed to take calcium supplements twice daily.  3.  ID prophylaxis: -Acyclovir 200 mg twice daily started on 06/30/2018.  As he started itching all over the body, his acyclovir was discontinued on 07/07/2018. -He is also taking aspirin 81 mg daily. -We will consider restarting it at some point.   4.  Low back pain: -He developed a rash with itching with oxycodone and hydrocodone. -He is taking Dilaudid 4 mg every 8 hours which is helping pain.  I will give a refill. -He will take lactulose for constipation.  5.  Lower extremity edema: -His lower extremity edema has improved.  He will continue torsemide 40 mg daily.    Total time spent is 25 minutes with more than 50% of the time spent face-to-face discussing and reinforcing treatment plan, side effects and coordination of care.    Orders placed this encounter:  Orders Placed This Encounter  Procedures  . CBC with Differential/Platelet  . Comprehensive metabolic panel  . Protein electrophoresis, serum  . Kappa/lambda light chains  . Lactate dehydrogenase      Derek Jack, MD [place] [phone]

## 2018-07-29 NOTE — Progress Notes (Signed)
Patient seen by Dr. Delton Coombes and lab work reviewed. Per MD, ok to proceed with Velcade, Cytoxan, and Retacrit today. Also give 1 amp of D50 and 10 units insulin for K of 5.7.  [name] tolerated treatment without incident or complaint. Peripheral IV noted for positive blood return by 2 RNs prior to, during, and after infusions. Discharged via wheelchair in satisfactory condition.

## 2018-07-29 NOTE — Assessment & Plan Note (Signed)
1.  Kappa light chain myeloma: - Presentation with acute renal failure and hypercalcemia. -M spike of 0.4 g.  Serum immunofixation did not show any immunoglobulin heavy chain.  Beta-2 microglobulin of 32.8.  Kappa free light chains of 12,085 and free light chain ratio of 1981.  LDH of 224. - Started on weekly CyBorD on 06/17/2018. 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  Cytogenetic analysis shows complex chromosome aberrations.  Gain of 1 q. was seen.  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 minus; 17p- - Week 6 of treatment on 07/22/2018.  Creatinine improved to 4.3. -He may proceed with Velcade and Cytoxan today. -Kappa light chains improved to 9425.  M spike improved to 0.2 g.  Free light chain ratio has improved to 1273. -Procrit 20,000 units was started on 07/22/2018.  He will continue it weekly. - I will obtain another free light chains and SPEP next week.   2.  Hypercalcemia: - Status post denosumab on 06/17/2018.  He was also treated with calcitonin twice daily while inpatient. -Calcium today 7.1.  He was instructed to take calcium supplements twice daily.  3.  ID prophylaxis: -Acyclovir 200 mg twice daily started on 06/30/2018.  As he started itching all over the body, his acyclovir was discontinued on 07/07/2018. -He is also taking aspirin 81 mg daily. -We will consider restarting it at some point.   4.  Low back pain: -He developed a rash with itching with oxycodone and hydrocodone. -He is taking Dilaudid 4 mg every 8 hours which is helping pain.  I will give a refill. -He will take lactulose for constipation.  5.  Lower extremity edema: -His lower extremity edema has improved.  He will continue torsemide 40 mg daily.

## 2018-07-29 NOTE — Progress Notes (Signed)
07/29/18  Received verbal order to give patient:  Regular Insulin 10 units + Dextrose 50% 1 amp  DX: hyperkalemia  Orders repeated back and entered as ordered.  V.O. Dr Sylvie Farrier LPN/[facility] Ronnald Ramp, PharmD

## 2018-07-29 NOTE — Addendum Note (Signed)
Addended by: Derek Jack on: 07/29/2018 03:08 PM   Modules accepted: Orders

## 2018-07-29 NOTE — Patient Instructions (Addendum)
[facility] at [facility] Discharge Instructions  You were seen today by Dr. Katragadda. He went over your recent lab results. He will see you back in 1 week for labs and follow up.   Thank you for choosing [facility] at [facility] to provide your oncology and hematology care.  To afford each patient quality time with our provider, please arrive at least 15 minutes before your scheduled appointment time.   If you have a lab appointment with the Cancer Center please come in thru the  Main Entrance and check in at the main information desk  You need to re-schedule your appointment should you arrive 10 or more minutes late.  We strive to give you quality time with our providers, and arriving late affects you and other patients whose appointments are after yours.  Also, if you no show three or more times for appointments you may be dismissed from the clinic at the providers discretion.     Again, thank you for choosing [facility].  Our hope is that these requests will decrease the amount of time that you wait before being seen by our physicians.       _____________________________________________________________  Should you have questions after your visit to [facility], please contact our office at [phone] between the hours of 8:00 a.m. and 4:30 p.m.  Voicemails left after 4:00 p.m. will not be returned until the following business day.  For prescription refill requests, have your pharmacy contact our office and allow 72 hours.    Cancer Center Support Programs:   > Cancer Support Group  2nd Tuesday of the month 1pm-2pm, Journey Room    

## 2018-07-29 NOTE — Patient Instructions (Signed)
[facility] Discharge Instructions for Patients Receiving Chemotherapy   Beginning January 23rd 2017 lab work for the [facility] will be done in the  Main lab at [facility] on 1st floor. If you have a lab appointment with the [facility] please come in thru the  Main Entrance and check in at the main information desk   Today you received the following chemotherapy agents Velcade and Cytoxan  To help prevent nausea and vomiting after your treatment, we encourage you to take your nausea medication   If you develop nausea and vomiting, or diarrhea that is not controlled by your medication, call the clinic.  The clinic phone number is [phone]. Office hours are Monday-Friday 8:30am-5:00pm.  BELOW ARE SYMPTOMS THAT SHOULD BE REPORTED IMMEDIATELY:  *FEVER GREATER THAN 101.0 F  *CHILLS WITH OR WITHOUT FEVER  NAUSEA AND VOMITING THAT IS NOT CONTROLLED WITH YOUR NAUSEA MEDICATION  *UNUSUAL SHORTNESS OF BREATH  *UNUSUAL BRUISING OR BLEEDING  TENDERNESS IN MOUTH AND THROAT WITH OR WITHOUT PRESENCE OF ULCERS  *URINARY PROBLEMS  *BOWEL PROBLEMS  UNUSUAL RASH Items with * indicate a potential emergency and should be followed up as soon as possible. If you have an emergency after office hours please contact your primary care physician or go to the nearest emergency department.  Please call the clinic during office hours if you have any questions or concerns.   You may also contact the Patient Navigator at [phone] should you have any questions or need assistance in obtaining follow up care.      Resources For Cancer Patients and their Caregivers ? American Cancer Society: Can assist with transportation, wigs, general needs, runs Look Good Feel Better.        (364) 735-9508 ? Cancer Care: Provides financial assistance, online support groups, medication/co-pay assistance.  1-800-813-HOPE (703) 020-9706) ? [facility] Assists  [place] cancer patients and their families through emotional , educational and financial support.  303 832 0425 ? [place] DSS Where to apply for food stamps, Medicaid and utility assistance. 289-049-8079 ? [facility]: Transportation to medical appointments. 365-402-2285 ? Social Security Administration: May apply for disability if have a Stage IV cancer. 7721550786 904-881-7984 ? [employer], Disability and Transit Services: Assists with nutrition, care and transit needs. (915)423-3828

## 2018-08-06 ENCOUNTER — Inpatient Hospital Stay (HOSPITAL_COMMUNITY): Payer: Medicare Other

## 2018-08-06 ENCOUNTER — Inpatient Hospital Stay (HOSPITAL_BASED_OUTPATIENT_CLINIC_OR_DEPARTMENT_OTHER): Payer: Medicare Other | Admitting: Hematology

## 2018-08-06 ENCOUNTER — Inpatient Hospital Stay (HOSPITAL_COMMUNITY): Payer: Medicare Other | Attending: Hematology

## 2018-08-06 ENCOUNTER — Other Ambulatory Visit: Payer: Self-pay

## 2018-08-06 ENCOUNTER — Encounter (HOSPITAL_COMMUNITY): Payer: Self-pay | Admitting: Hematology

## 2018-08-06 VITALS — BP 115/59 | HR 77 | Temp 97.6°F | Resp 16

## 2018-08-06 DIAGNOSIS — C9000 Multiple myeloma not having achieved remission: Secondary | ICD-10-CM

## 2018-08-06 DIAGNOSIS — Z5112 Encounter for antineoplastic immunotherapy: Secondary | ICD-10-CM | POA: Diagnosis present

## 2018-08-06 DIAGNOSIS — R600 Localized edema: Secondary | ICD-10-CM | POA: Diagnosis not present

## 2018-08-06 DIAGNOSIS — F17210 Nicotine dependence, cigarettes, uncomplicated: Secondary | ICD-10-CM | POA: Diagnosis not present

## 2018-08-06 DIAGNOSIS — Z79899 Other long term (current) drug therapy: Secondary | ICD-10-CM | POA: Insufficient documentation

## 2018-08-06 DIAGNOSIS — M545 Low back pain: Secondary | ICD-10-CM | POA: Diagnosis not present

## 2018-08-06 DIAGNOSIS — D649 Anemia, unspecified: Secondary | ICD-10-CM | POA: Insufficient documentation

## 2018-08-06 DIAGNOSIS — N189 Chronic kidney disease, unspecified: Secondary | ICD-10-CM

## 2018-08-06 DIAGNOSIS — Z5111 Encounter for antineoplastic chemotherapy: Secondary | ICD-10-CM | POA: Insufficient documentation

## 2018-08-06 DIAGNOSIS — N185 Chronic kidney disease, stage 5: Secondary | ICD-10-CM

## 2018-08-06 DIAGNOSIS — E875 Hyperkalemia: Secondary | ICD-10-CM | POA: Insufficient documentation

## 2018-08-06 LAB — CBC WITH DIFFERENTIAL/PLATELET
Abs Immature Granulocytes: 0.04 10*3/uL (ref 0.00–0.07)
Basophils Absolute: 0 10*3/uL (ref 0.0–0.1)
Basophils Relative: 1 %
Eosinophils Absolute: 0.1 10*3/uL (ref 0.0–0.5)
Eosinophils Relative: 3 %
HCT: 25.3 % — ABNORMAL LOW (ref 39.0–52.0)
Hemoglobin: 8 g/dL — ABNORMAL LOW (ref 13.0–17.0)
Immature Granulocytes: 1 %
Lymphocytes Relative: 15 %
Lymphs Abs: 0.5 10*3/uL — ABNORMAL LOW (ref 0.7–4.0)
MCH: 33.2 pg (ref 26.0–34.0)
MCHC: 31.6 g/dL (ref 30.0–36.0)
MCV: 105 fL — ABNORMAL HIGH (ref 80.0–100.0)
Monocytes Absolute: 1 10*3/uL (ref 0.1–1.0)
Monocytes Relative: 29 %
Neutro Abs: 1.8 10*3/uL (ref 1.7–7.7)
Neutrophils Relative %: 51 %
Platelets: 220 10*3/uL (ref 150–400)
RBC: 2.41 MIL/uL — ABNORMAL LOW (ref 4.22–5.81)
RDW: 20.7 % — ABNORMAL HIGH (ref 11.5–15.5)
WBC: 3.5 10*3/uL — ABNORMAL LOW (ref 4.0–10.5)
nRBC: 0 % (ref 0.0–0.2)

## 2018-08-06 LAB — COMPREHENSIVE METABOLIC PANEL
ALT: 10 U/L (ref 0–44)
AST: 14 U/L — ABNORMAL LOW (ref 15–41)
Albumin: 4.1 g/dL (ref 3.5–5.0)
Alkaline Phosphatase: 51 U/L (ref 38–126)
Anion gap: 11 (ref 5–15)
BUN: 55 mg/dL — ABNORMAL HIGH (ref 6–20)
CO2: 20 mmol/L — ABNORMAL LOW (ref 22–32)
Calcium: 7.1 mg/dL — ABNORMAL LOW (ref 8.9–10.3)
Chloride: 107 mmol/L (ref 98–111)
Creatinine, Ser: 4.64 mg/dL — ABNORMAL HIGH (ref 0.61–1.24)
GFR calc Af Amer: 15 mL/min — ABNORMAL LOW (ref >60)
GFR calc non Af Amer: 13 mL/min — ABNORMAL LOW (ref >60)
Glucose, Bld: 90 mg/dL (ref 70–99)
Potassium: 6.6 mmol/L (ref 3.5–5.1)
Sodium: 138 mmol/L (ref 135–145)
Total Bilirubin: 0.7 mg/dL (ref 0.3–1.2)
Total Protein: 6.9 g/dL (ref 6.5–8.1)

## 2018-08-06 LAB — LACTATE DEHYDROGENASE: LDH: 237 U/L — ABNORMAL HIGH (ref 98–192)

## 2018-08-06 MED ORDER — INSULIN REGULAR HUMAN 100 UNIT/ML IJ SOLN
10.0000 [IU] | Freq: Once | INTRAMUSCULAR | Status: AC
  Administered 2018-08-06: 10 [IU] via SUBCUTANEOUS
  Filled 2018-08-06: qty 0.1

## 2018-08-06 MED ORDER — PALONOSETRON HCL INJECTION 0.25 MG/5ML
0.2500 mg | Freq: Once | INTRAVENOUS | Status: AC
  Administered 2018-08-06: 0.25 mg via INTRAVENOUS
  Filled 2018-08-06: qty 5

## 2018-08-06 MED ORDER — INSULIN ASPART 100 UNIT/ML ~~LOC~~ SOLN: 10.0000 [IU] | Freq: Once | SUBCUTANEOUS | Status: DC

## 2018-08-06 MED ORDER — INSULIN REGULAR HUMAN 100 UNIT/ML IJ SOLN
10.0000 [IU] | Freq: Once | INTRAMUSCULAR | Status: DC
  Filled 2018-08-06: qty 10

## 2018-08-06 MED ORDER — DEXTROSE 50 % IV SOLN
1.0000 | Freq: Once | INTRAVENOUS | Status: AC
  Administered 2018-08-06: 50 mL via INTRAVENOUS
  Filled 2018-08-06: qty 50

## 2018-08-06 MED ORDER — EPOETIN ALFA-EPBX 10000 UNIT/ML IJ SOLN: 20000.0000 [IU] | Freq: Once | INTRAMUSCULAR | Status: DC

## 2018-08-06 MED ORDER — SODIUM CHLORIDE 0.9 % IV SOLN
20.0000 mg | Freq: Once | INTRAVENOUS | Status: AC
  Administered 2018-08-06: 20 mg via INTRAVENOUS
  Filled 2018-08-06: qty 20

## 2018-08-06 MED ORDER — BORTEZOMIB CHEMO SQ INJECTION 3.5 MG (2.5MG/ML)
1.3000 mg/m2 | Freq: Once | INTRAMUSCULAR | Status: AC
  Administered 2018-08-06: 2.5 mg via SUBCUTANEOUS
  Filled 2018-08-06: qty 1

## 2018-08-06 MED ORDER — INSULIN REGULAR HUMAN 100 UNIT/ML IJ SOLN
10.0000 [IU] | Freq: Once | INTRAMUSCULAR | Status: AC
  Administered 2018-08-06: 10 [IU] via INTRAVENOUS
  Filled 2018-08-06: qty 0.1

## 2018-08-06 MED ORDER — SODIUM CHLORIDE 0.9 % IV SOLN
300.0000 mg/m2 | Freq: Once | INTRAVENOUS | Status: AC
  Administered 2018-08-06: 600 mg via INTRAVENOUS
  Filled 2018-08-06: qty 30

## 2018-08-06 MED ORDER — SODIUM POLYSTYRENE SULFONATE 15 GM/60ML PO SUSP
60.0000 g | Freq: Once | ORAL | Status: DC
  Filled 2018-08-06: qty 240

## 2018-08-06 MED ORDER — SODIUM CHLORIDE 0.9 % IV SOLN
1.0000 g | Freq: Once | INTRAVENOUS | Status: AC
  Administered 2018-08-06: 1 g via INTRAVENOUS
  Filled 2018-08-06: qty 10

## 2018-08-06 MED ORDER — SODIUM CHLORIDE 0.9 % IV SOLN
Freq: Once | INTRAVENOUS | Status: AC
  Administered 2018-08-06: 11:00:00 via INTRAVENOUS

## 2018-08-06 NOTE — Progress Notes (Signed)
[address] [place], [place] [address]   CLINIC:  Medical Oncology/Hematology  PCP:  Rosita Fire, MD [place] [id number] [phone]   REASON FOR VISIT:  Follow-up for multiple myeloma   BRIEF ONCOLOGIC HISTORY:    Kappa light chain myeloma ([place])   06/16/2018 Initial Diagnosis    Kappa light chain myeloma ([place])    06/17/2018 -  Chemotherapy    The patient had palonosetron (ALOXI) injection 0.25 mg, 0.25 mg, Intravenous,  Once, 8 of 10 cycles Administration: 0.25 mg (06/17/2018), 0.25 mg (06/23/2018), 0.25 mg (06/30/2018), 0.25 mg (07/07/2018), 0.25 mg (07/14/2018), 0.25 mg (07/22/2018), 0.25 mg (07/29/2018), 0.25 mg (08/06/2018) bortezomib SQ (VELCADE) chemo injection 2.5 mg, 1.3 mg/m2 = 2.5 mg, Subcutaneous,  Once, 8 of 10 cycles Administration: 2.5 mg (06/17/2018), 2.5 mg (06/23/2018), 2.5 mg (06/30/2018), 2.5 mg (07/07/2018), 2.5 mg (07/14/2018), 2.5 mg (07/22/2018), 2.5 mg (07/29/2018), 2.5 mg (08/06/2018) cyclophosphamide (CYTOXAN) 300 mg in sodium chloride 0.9 % 250 mL chemo infusion, 300 mg (100 % of original dose 300 mg), Intravenous,  Once, 8 of 10 cycles Dose modification: 300 mg (original dose 300 mg, Cycle 1, Reason: Change in SCr/CrCl), 500 mg (original dose 500 mg, Cycle 2, Reason: Provider Judgment), 500 mg (original dose 500 mg, Cycle 3, Reason: Change in SCr/CrCl) Administration: 300 mg (06/17/2018), 500 mg (06/23/2018), 500 mg (06/30/2018), 600 mg (07/07/2018), 600 mg (07/14/2018), 600 mg (07/22/2018), 600 mg (07/29/2018), 600 mg (08/06/2018)  for chemotherapy treatment.       CANCER STAGING: Cancer Staging No matching staging information was found for the patient.   INTERVAL HISTORY:  Mr. Mckamie 58 y.o. male returns for routine follow-up and consideration for next cycle of chemotherapy. He is here today alone. He states that he continues to have swelling in his legs. He continues to have nausea off and on. He states that he has a  cough from smoking. He states that he continues to have numbness in his fingers and toes. Denies any vomiting, or diarrhea. Denies any new pains. Had not noticed any recent bleeding such as epistaxis, hematuria or hematochezia. Denies recent chest pain on exertion, shortness of breath on minimal exertion, pre-syncopal episodes, or palpitations. Denies any recent fevers, infections, or recent hospitalizations. Patient reports appetite at 75% and energy level at 60%.      REVIEW OF SYSTEMS:  Review of Systems  Respiratory: Positive for cough.   Cardiovascular: Positive for leg swelling.  Gastrointestinal: Positive for nausea.  Neurological: Positive for numbness.     PAST MEDICAL/SURGICAL HISTORY:  Past Medical History:  Diagnosis Date  . Allergy   . Arthritis    neck and back  . Blood transfusion without reported diagnosis   . BPH (benign prostatic hyperplasia)   . Cancer [facility]) 2004   testicle  . Chronic kidney disease    kidney stones  . Left lumbar radiculopathy 06/12/2016  . 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 Medical history non-contributory    Pt has scattered thoughts and uncertain of past medical history  . 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 Stones, urinary tract   . Substance abuse ([place])    prescribed oxydcodone- 10 years   Past Surgical History:  Procedure Laterality Date  . BACK SURGERY     x5  . CERVICAL DISC SURGERY     x2  . COLONOSCOPY WITH PROPOFOL N/A 08/11/2016   Procedure: COLONOSCOPY WITH PROPOFOL;  Surgeon: [name], MD;  Location: [facility];  Service: Endoscopy;  Laterality: N/A;  130   . POLYPECTOMY  08/11/2016   Procedure: POLYPECTOMY;  Surgeon: [name], MD;  Location: [facility];  Service: Endoscopy;;  colon  . testicular cancer  2004  . TONSILLECTOMY       SOCIAL HISTORY:  Social History   Socioeconomic History  . Marital status: Single    Spouse name: Not on file  . Number of children: 1  . Years of education: 85   . Highest education level: Not on file  Occupational History  . Occupation: retired    Comment: Psychologist, prison and probation services  . Occupation: disabled  Social Needs  . Financial resource strain: Not on file  . Food insecurity:    Worry: Not on file    Inability: Not on file  . Transportation needs:    Medical: Not on file    Non-medical: Not on file  Tobacco Use  . Smoking status: Current Every Day Smoker    Packs/day: 1.00    Years: 40.00    Pack years: 40.00    Types: Cigarettes    Start date: 03/31/1974  . Smokeless tobacco: Never Used  Substance and Sexual Activity  . Alcohol use: Yes    Comment: Occasional  . Drug use: No    Types: Cocaine    Comment: Remote hx of cocaine, quit 1989  . Sexual activity: Yes  Lifestyle  . Physical activity:    Days per week: Not on file    Minutes per session: Not on file  . Stress: Not on file  Relationships  . Social connections:    Talks on phone: Not on file    Gets together: Not on file    Attends religious service: Not on file    Active member of club or organization: Not on file    Attends meetings of clubs or organizations: Not on file    Relationship status: Not on file  . Intimate partner violence:    Fear of current or ex partner: Not on file    Emotionally abused: Not on file    Physically abused: Not on file    Forced sexual activity: Not on file  Other Topics Concern  . Not on file  Social History Narrative   [employer] for 12 years   [employer] for 73 years      Never married   One daughter      Lives alone   Retail banker   Right-handed   Occasional caffeine use          FAMILY HISTORY:  Family History  Problem Relation Age of Onset  . COPD Mother   . Heart disease Mother   . Other Father        Never knew his father.  . Thyroid disease Sister   . Arthritis Sister   . Heart disease Sister        bypass  . Colon cancer Neg Hx     CURRENT MEDICATIONS:  Outpatient Encounter Medications as of  08/06/2018  Medication Sig  . acyclovir (ZOVIRAX) 200 MG capsule Take 200 mg by mouth 2 (two) times daily.  . bortezomib IV (VELCADE) 3.5 MG injection Inject 1.3 mg/m2 into the vein once a week.   . CYCLOPHOSPHAMIDE IV Inject into the vein once a week.  . diphenhydrAMINE (BENADRYL) 25 MG tablet Take 1 tablet (25 mg total) by mouth as needed for itching.  Marland Kitchen HYDROmorphone (DILAUDID) 4 MG tablet Take  1 tablet (4 mg total) by mouth every 8 (eight) hours as needed for severe pain.  . Misc. Devices MISC Please provide patient with rollaider.  Dx: multiple myeloma C90.0  . protein supplement (RESOURCE BENEPROTEIN) POWD Take 1 Scoop by mouth 3 (three) times daily with meals.  . senna (SENOKOT) 8.6 MG tablet Take 2 tablets by mouth 2 (two) times daily.  . sodium bicarbonate 650 MG tablet Take 1,300 mg by mouth 2 (two) times daily.  Marland Kitchen torsemide (DEMADEX) 20 MG tablet Take 2 tablets (40 mg total) by mouth daily for 30 days.   No facility-administered encounter medications on file as of 08/06/2018.     ALLERGIES:  Allergies  Allergen Reactions  . Acetaminophen Nausea Only and Other (See Comments)    Elevated liver enzymes  . Cymbalta [Duloxetine Hcl] Swelling    Facial swelling  . Diclofenac Sodium Swelling       . Imodium [Loperamide] Swelling    facial  . Lyrica [Pregabalin] Swelling  . Morphine Other (See Comments)    Bradycardia   . Vioxx [Rofecoxib] Swelling  . Aleve [Naproxen] Swelling    eye     PHYSICAL EXAM:  ECOG Performance status: 1  Vitals:   08/06/18 0858  BP: 122/69  Pulse: 80  Resp: 16  Temp: 98 F (36.7 C)  SpO2: 100%   Filed Weights   08/06/18 0858  Weight: 159 lb 3.2 oz (72.2 kg)    Physical Exam Vitals signs reviewed.  Constitutional:      Appearance: Normal appearance.  Cardiovascular:     Rate and Rhythm: Normal rate and regular rhythm.     Heart sounds: Normal heart sounds.  Pulmonary:     Effort: Pulmonary effort is normal.     Breath sounds:  Normal breath sounds.  Abdominal:     General: There is no distension.     Palpations: Abdomen is soft. There is no mass.  Musculoskeletal:        General: No swelling.  Skin:    General: Skin is warm.  Neurological:     General: No focal deficit present.     Mental Status: He is alert and oriented to person, place, and time.  Psychiatric:        Mood and Affect: Mood normal.        Behavior: Behavior normal.      LABORATORY DATA:  I have reviewed the labs as listed.  CBC    Component Value Date/Time   WBC 3.5 (L) 08/06/2018 0814   RBC 2.41 (L) 08/06/2018 0814   HGB 8.0 (L) 08/06/2018 0814   HCT 25.3 (L) 08/06/2018 0814   PLT 220 08/06/2018 0814   MCV 105.0 (H) 08/06/2018 0814   MCH 33.2 08/06/2018 0814   MCHC 31.6 08/06/2018 0814   RDW 20.7 (H) 08/06/2018 0814   LYMPHSABS 0.5 (L) 08/06/2018 0814   MONOABS 1.0 08/06/2018 0814   EOSABS 0.1 08/06/2018 0814   BASOSABS 0.0 08/06/2018 0814   CMP Latest Ref Rng & Units 08/06/2018 07/29/2018 07/21/2018  Glucose 70 - 99 mg/dL 90 86 99  BUN 6 - 20 mg/dL 55(H) 42(H) 51(H)  Creatinine 0.61 - 1.24 mg/dL 4.64(H) 4.31(H) 4.52(H)  Sodium 135 - 145 mmol/L 138 136 139  Potassium 3.5 - 5.1 mmol/L 6.6(HH) 5.7(H) 4.7  Chloride 98 - 111 mmol/L 107 105 108  CO2 22 - 32 mmol/L 20(L) 19(L) 19(L)  Calcium 8.9 - 10.3 mg/dL 7.1(L) 7.1(L) 7.1(L)  Total Protein 6.5 -  8.1 g/dL 6.9 7.0 7.2  Total Bilirubin 0.3 - 1.2 mg/dL 0.7 0.7 0.6  Alkaline Phos 38 - 126 U/L 51 56 48  AST 15 - 41 U/L 14(L) 13(L) 15  ALT 0 - 44 U/L 10 11 13        DIAGNOSTIC IMAGING:  I have independently reviewed the scans and discussed with the patient.   I have reviewed Venita Lick LPN's note and agree with the documentation.  I personally performed a face-to-face visit, made revisions and my assessment and plan is as follows.    ASSESSMENT & PLAN:   Kappa light chain myeloma ([place]) 1.  Kappa light chain myeloma: - Presentation with acute renal failure and  hypercalcemia. -M spike of 0.4 g.  Serum immunofixation did not show any immunoglobulin heavy chain.  Beta-2 microglobulin of 32.8.  Kappa free light chains of 12,085 and free light chain ratio of 1981.  LDH of 224. - Started on weekly CyBorD on 06/17/2018. 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  Cytogenetic analysis shows complex chromosome aberrations.  Gain of 1 q. was seen.  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 minus; 17p- - Week 7 of treatment on 07/29/2018. -We have reviewed his blood work.  Hemoglobin slightly improved with Procrit.  Procrit was started on 07/29/2026 dose of 20,000 units. -We will follow-up on kappa light chains and M spike from today. - He may proceed with Velcade and cyclophosphamide today.  I will see him back in 1 week for follow-up.   2.  Hyperkalemia: -Potassium today 6.6. -I have counseled him against eating potassium rich foods.  He will not drink orange juice and cranberry juice. -He is taking Benefiber twice daily which also has potassium.  I have told him to take lactulose instead with stool softener. -Today we have given him insulin with dextrose.  We have also given Kayexalate 60 g.   3.  ID prophylaxis: -Acyclovir 200 mg twice daily started on 06/30/2018.  As he started itching all over the body, his acyclovir was discontinued on 07/07/2018. -He is also taking aspirin 81 mg daily. -We will consider restarting it at some point.   4.  Low back pain: -He had rash and itching with oxycodone and hydrocodone. -He is taking Dilaudid 4 mg every 8 hours which is helping pain.  5.  Lower extremity edema: -This has improved.  I have told him to cut back on torsemide to 20 mg daily.     Total time spent is 25 minutes with more than 50% of the time spent face-to-face discussing treatment plan and coordination of care.    Orders placed this encounter:  No orders of the defined types were placed in this encounter.     Derek Jack, MD [place] [phone]

## 2018-08-06 NOTE — Patient Instructions (Addendum)
[facility] at [facility] Discharge Instructions  You were seen today by Dr. Katragadda. He went over your recent lab results. He will see you back in 1 week for labs and follow up.   Thank you for choosing [facility] at [facility] to provide your oncology and hematology care.  To afford each patient quality time with our provider, please arrive at least 15 minutes before your scheduled appointment time.   If you have a lab appointment with the Cancer Center please come in thru the  Main Entrance and check in at the main information desk  You need to re-schedule your appointment should you arrive 10 or more minutes late.  We strive to give you quality time with our providers, and arriving late affects you and other patients whose appointments are after yours.  Also, if you no show three or more times for appointments you may be dismissed from the clinic at the providers discretion.     Again, thank you for choosing [facility].  Our hope is that these requests will decrease the amount of time that you wait before being seen by our physicians.       _____________________________________________________________  Should you have questions after your visit to [facility], please contact our office at [phone] between the hours of 8:00 a.m. and 4:30 p.m.  Voicemails left after 4:00 p.m. will not be returned until the following business day.  For prescription refill requests, have your pharmacy contact our office and allow 72 hours.    Cancer Center Support Programs:   > Cancer Support Group  2nd Tuesday of the month 1pm-2pm, Journey Room    

## 2018-08-06 NOTE — Assessment & Plan Note (Signed)
1.  Kappa light chain myeloma: - Presentation with acute renal failure and hypercalcemia. -M spike of 0.4 g.  Serum immunofixation did not show any immunoglobulin heavy chain.  Beta-2 microglobulin of 32.8.  Kappa free light chains of 12,085 and free light chain ratio of 1981.  LDH of 224. - Started on weekly CyBorD on 06/17/2018. 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  Cytogenetic analysis shows complex chromosome aberrations.  Gain of 1 q. was seen.  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 minus; 17p- - Week 7 of treatment on 07/29/2018. -We have reviewed his blood work.  Hemoglobin slightly improved with Procrit.  Procrit was started on 07/29/2026 dose of 20,000 units. -We will follow-up on kappa light chains and M spike from today. - He may proceed with Velcade and cyclophosphamide today.  I will see him back in 1 week for follow-up.   2.  Hyperkalemia: -Potassium today 6.6. -I have counseled him against eating potassium rich foods.  He will not drink orange juice and cranberry juice. -He is taking Benefiber twice daily which also has potassium.  I have told him to take lactulose instead with stool softener. -Today we have given him insulin with dextrose.  We have also given Kayexalate 60 g.   3.  ID prophylaxis: -Acyclovir 200 mg twice daily started on 06/30/2018.  As he started itching all over the body, his acyclovir was discontinued on 07/07/2018. -He is also taking aspirin 81 mg daily. -We will consider restarting it at some point.   4.  Low back pain: -He had rash and itching with oxycodone and hydrocodone. -He is taking Dilaudid 4 mg every 8 hours which is helping pain.  5.  Lower extremity edema: -This has improved.  I have told him to cut back on torsemide to 20 mg daily.

## 2018-08-06 NOTE — Progress Notes (Signed)
Labs reviewed in office visit today, Potassium noted by MD. Proceed with treatment and additional meds.  Treatment given per orders. Patient tolerated it well without problems. Vitals stable and discharged home from clinic via wheelchair. Follow up as scheduled.

## 2018-08-06 NOTE — Progress Notes (Signed)
CRITICAL VALUE ALERT  Critical Value:  K+ 6.6  Date & Time Notied:  08/06/2018 at [place]  Provider Notified: Dr. Delton Coombes  Orders Received/Actions taken: Give D50 one amp IV, Novolog 10 units IV, and Kayexalate 60g po.

## 2018-08-06 NOTE — Progress Notes (Signed)
08/06/18  Elevated K+ 6.6 -  Patient given regular insulin 10 units intravenously + Dextrose 50% x 1 amp (50 ml) intravenously + Kayexalate 60 gm oral per Dr Delton Coombes.  Received order to add Calcium gluconate 1 gm IVPB to treatment.  Order entered on behalf of Dr Delton Coombes  V.O. Dr Beckey Downing LPN/Haylen Bellotti Ronnald Ramp, PharmD

## 2018-08-09 LAB — PROTEIN ELECTROPHORESIS, SERUM
A/G Ratio: 1.5 (ref 0.7–1.7)
Albumin ELP: 3.8 g/dL (ref 2.9–4.4)
Alpha-1-Globulin: 0.2 g/dL (ref 0.0–0.4)
Alpha-2-Globulin: 1 g/dL (ref 0.4–1.0)
Beta Globulin: 0.7 g/dL (ref 0.7–1.3)
Gamma Globulin: 0.6 g/dL (ref 0.4–1.8)
Globulin, Total: 2.5 g/dL (ref 2.2–3.9)
M-Spike, %: 0.3 g/dL — ABNORMAL HIGH
Total Protein ELP: 6.3 g/dL (ref 6.0–8.5)

## 2018-08-09 LAB — KAPPA/LAMBDA LIGHT CHAINS
Kappa free light chain: 9982.1 mg/L — ABNORMAL HIGH (ref 3.3–19.4)
Kappa, lambda light chain ratio: 1405.93 — ABNORMAL HIGH (ref 0.26–1.65)
Lambda free light chains: 7.1 mg/L (ref 5.7–26.3)

## 2018-08-11 ENCOUNTER — Other Ambulatory Visit (HOSPITAL_COMMUNITY): Payer: Self-pay | Admitting: Hematology

## 2018-08-13 ENCOUNTER — Encounter (HOSPITAL_COMMUNITY): Payer: Self-pay | Admitting: Hematology

## 2018-08-13 ENCOUNTER — Inpatient Hospital Stay (HOSPITAL_BASED_OUTPATIENT_CLINIC_OR_DEPARTMENT_OTHER): Payer: Medicare Other | Admitting: Hematology

## 2018-08-13 ENCOUNTER — Inpatient Hospital Stay (HOSPITAL_COMMUNITY): Payer: Medicare Other

## 2018-08-13 ENCOUNTER — Other Ambulatory Visit: Payer: Self-pay

## 2018-08-13 ENCOUNTER — Encounter (HOSPITAL_COMMUNITY): Payer: Self-pay

## 2018-08-13 VITALS — BP 120/67 | HR 79 | Temp 98.7°F | Resp 18

## 2018-08-13 VITALS — BP 104/74 | HR 83 | Temp 98.0°F | Resp 18 | Wt 166.6 lb

## 2018-08-13 DIAGNOSIS — F17210 Nicotine dependence, cigarettes, uncomplicated: Secondary | ICD-10-CM

## 2018-08-13 DIAGNOSIS — C9000 Multiple myeloma not having achieved remission: Secondary | ICD-10-CM

## 2018-08-13 DIAGNOSIS — E785 Hyperlipidemia, unspecified: Secondary | ICD-10-CM

## 2018-08-13 DIAGNOSIS — N185 Chronic kidney disease, stage 5: Secondary | ICD-10-CM

## 2018-08-13 DIAGNOSIS — Z79899 Other long term (current) drug therapy: Secondary | ICD-10-CM

## 2018-08-13 DIAGNOSIS — R600 Localized edema: Secondary | ICD-10-CM

## 2018-08-13 DIAGNOSIS — N189 Chronic kidney disease, unspecified: Secondary | ICD-10-CM | POA: Diagnosis not present

## 2018-08-13 DIAGNOSIS — D649 Anemia, unspecified: Secondary | ICD-10-CM | POA: Diagnosis not present

## 2018-08-13 DIAGNOSIS — M545 Low back pain: Secondary | ICD-10-CM

## 2018-08-13 LAB — MAGNESIUM: Magnesium: 1.9 mg/dL (ref 1.7–2.4)

## 2018-08-13 LAB — CBC WITH DIFFERENTIAL/PLATELET
Abs Immature Granulocytes: 0.01 10*3/uL (ref 0.00–0.07)
Basophils Absolute: 0 10*3/uL (ref 0.0–0.1)
Basophils Relative: 1 %
Eosinophils Absolute: 0.2 10*3/uL (ref 0.0–0.5)
Eosinophils Relative: 5 %
HCT: 24.1 % — ABNORMAL LOW (ref 39.0–52.0)
Hemoglobin: 7.6 g/dL — ABNORMAL LOW (ref 13.0–17.0)
Immature Granulocytes: 0 %
Lymphocytes Relative: 16 %
Lymphs Abs: 0.5 10*3/uL — ABNORMAL LOW (ref 0.7–4.0)
MCH: 33.3 pg (ref 26.0–34.0)
MCHC: 31.5 g/dL (ref 30.0–36.0)
MCV: 105.7 fL — ABNORMAL HIGH (ref 80.0–100.0)
Monocytes Absolute: 1 10*3/uL (ref 0.1–1.0)
Monocytes Relative: 33 %
Neutro Abs: 1.4 10*3/uL — ABNORMAL LOW (ref 1.7–7.7)
Neutrophils Relative %: 45 %
Platelets: 186 10*3/uL (ref 150–400)
RBC: 2.28 MIL/uL — ABNORMAL LOW (ref 4.22–5.81)
RDW: 20.3 % — ABNORMAL HIGH (ref 11.5–15.5)
WBC: 3.2 10*3/uL — ABNORMAL LOW (ref 4.0–10.5)
nRBC: 0 % (ref 0.0–0.2)

## 2018-08-13 LAB — COMPREHENSIVE METABOLIC PANEL
ALT: 10 U/L (ref 0–44)
AST: 15 U/L (ref 15–41)
Albumin: 3.8 g/dL (ref 3.5–5.0)
Alkaline Phosphatase: 46 U/L (ref 38–126)
Anion gap: 11 (ref 5–15)
BUN: 45 mg/dL — ABNORMAL HIGH (ref 6–20)
CO2: 20 mmol/L — ABNORMAL LOW (ref 22–32)
Calcium: 6.1 mg/dL — CL (ref 8.9–10.3)
Chloride: 110 mmol/L (ref 98–111)
Creatinine, Ser: 4.33 mg/dL — ABNORMAL HIGH (ref 0.61–1.24)
GFR calc Af Amer: 16 mL/min — ABNORMAL LOW (ref >60)
GFR calc non Af Amer: 14 mL/min — ABNORMAL LOW (ref >60)
Glucose, Bld: 88 mg/dL (ref 70–99)
Potassium: 4.8 mmol/L (ref 3.5–5.1)
Sodium: 141 mmol/L (ref 135–145)
Total Bilirubin: 0.6 mg/dL (ref 0.3–1.2)
Total Protein: 6.5 g/dL (ref 6.5–8.1)

## 2018-08-13 MED ORDER — EPOETIN ALFA 20000 UNIT/ML IJ SOLN
INTRAMUSCULAR | Status: AC
  Filled 2018-08-13: qty 1

## 2018-08-13 MED ORDER — BORTEZOMIB CHEMO SQ INJECTION 3.5 MG (2.5MG/ML)
1.3000 mg/m2 | Freq: Once | INTRAMUSCULAR | Status: AC
  Administered 2018-08-13: 2.5 mg via SUBCUTANEOUS
  Filled 2018-08-13: qty 1

## 2018-08-13 MED ORDER — SODIUM CHLORIDE 0.9 % IV SOLN
Freq: Once | INTRAVENOUS | Status: AC
  Administered 2018-08-13: 09:00:00 via INTRAVENOUS

## 2018-08-13 MED ORDER — CALCIUM GLUCONATE-NACL 1-0.675 GM/50ML-% IV SOLN
1.0000 g | Freq: Once | INTRAVENOUS | Status: AC
  Administered 2018-08-13: 1000 mg via INTRAVENOUS
  Filled 2018-08-13: qty 50

## 2018-08-13 MED ORDER — PALONOSETRON HCL INJECTION 0.25 MG/5ML
0.2500 mg | Freq: Once | INTRAVENOUS | Status: AC
  Administered 2018-08-13: 0.25 mg via INTRAVENOUS
  Filled 2018-08-13: qty 5

## 2018-08-13 MED ORDER — SODIUM CHLORIDE 0.9 % IV SOLN
300.0000 mg/m2 | Freq: Once | INTRAVENOUS | Status: AC
  Administered 2018-08-13: 600 mg via INTRAVENOUS
  Filled 2018-08-13: qty 30

## 2018-08-13 MED ORDER — EPOETIN ALFA-EPBX 10000 UNIT/ML IJ SOLN
INTRAMUSCULAR | Status: AC
  Filled 2018-08-13: qty 1

## 2018-08-13 MED ORDER — EPOETIN ALFA-EPBX 10000 UNIT/ML IJ SOLN
20000.0000 [IU] | Freq: Once | INTRAMUSCULAR | Status: AC
  Administered 2018-08-13: 20000 [IU] via SUBCUTANEOUS
  Filled 2018-08-13: qty 2

## 2018-08-13 MED ORDER — SODIUM CHLORIDE 0.9 % IV SOLN
20.0000 mg | Freq: Once | INTRAVENOUS | Status: AC
  Administered 2018-08-13: 20 mg via INTRAVENOUS
  Filled 2018-08-13: qty 20

## 2018-08-13 MED ORDER — SODIUM CHLORIDE 0.9 % IV SOLN
1.0000 g | Freq: Once | INTRAVENOUS | Status: DC
  Filled 2018-08-13: qty 10

## 2018-08-13 NOTE — Progress Notes (Signed)
Pt presents today for treatment and doctor's visit. VSS. Labs reviewed by Dr. Delton Coombes. MD aware of HGB 7.6, Creatinine 4.33, and Neuts# 1.4. Message received from [facility] LPN to proceed with treatment. Pt has no complaints of any changes since the last visit.   Treatment given today per MD orders. Tolerated infusion without adverse affects. Vital signs stable. No complaints at this time. Discharged from clinic via wheel chair. F/U with [facility] as scheduled.

## 2018-08-13 NOTE — Progress Notes (Signed)
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[place])    06/17/2018 -  Chemotherapy    The patient had palonosetron (ALOXI) injection 0.25 mg, 0.25 mg, Intravenous,  Once, 9 of 10 cycles Administration: 0.25 mg (06/17/2018), 0.25 mg (06/23/2018), 0.25 mg (06/30/2018), 0.25 mg (07/07/2018), 0.25 mg (07/14/2018), 0.25 mg (07/22/2018), 0.25 mg (07/29/2018), 0.25 mg (08/06/2018), 0.25 mg (08/13/2018) bortezomib SQ (VELCADE) chemo injection 2.5 mg, 1.3 mg/m2 = 2.5 mg, Subcutaneous,  Once, 9 of 10 cycles Administration: 2.5 mg (06/17/2018), 2.5 mg (06/23/2018), 2.5 mg (06/30/2018), 2.5 mg (07/07/2018), 2.5 mg (07/14/2018), 2.5 mg (07/22/2018), 2.5 mg (07/29/2018), 2.5 mg (08/06/2018), 2.5 mg (08/13/2018) cyclophosphamide (CYTOXAN) 300 mg in sodium chloride 0.9 % 250 mL chemo infusion, 300 mg (100 % of original dose 300 mg), Intravenous,  Once, 9 of 10 cycles Dose modification: 300 mg (original dose 300 mg, Cycle 1, Reason: Change in SCr/CrCl), 500 mg (original dose 500 mg, Cycle 2, Reason: Provider Judgment), 500 mg (original dose 500 mg, Cycle 3, Reason: Change in SCr/CrCl) Administration: 300 mg (06/17/2018), 500 mg (06/23/2018), 500 mg (06/30/2018), 600 mg (07/07/2018), 600 mg (07/14/2018), 600 mg (07/22/2018), 600 mg (07/29/2018), 600 mg (08/06/2018), 600 mg (08/13/2018)  for chemotherapy treatment.       CANCER STAGING: Cancer Staging No matching staging information was found for the patient.   INTERVAL HISTORY:  [name] 58 y.o. male returns for routine follow-up and consideration for next cycle of chemotherapy. He is here today alone. He states that he has felt better since his last visit.  Denies  any nausea, vomiting, or diarrhea. Denies any new pains. Had not noticed any recent bleeding such as epistaxis, hematuria or hematochezia. Denies recent chest pain on exertion, shortness of breath on minimal exertion, pre-syncopal episodes, or palpitations. Denies any numbness or tingling in hands or feet. Denies any recent fevers, infections, or recent hospitalizations. Patient reports appetite at 100% and energy level at 75%.      REVIEW OF SYSTEMS:  Review of Systems  Constitutional: Positive for fatigue.  Gastrointestinal: Positive for nausea.  Musculoskeletal: Positive for back pain.  Psychiatric/Behavioral: The patient is nervous/anxious.      PAST MEDICAL/SURGICAL HISTORY:  Past Medical History:  Diagnosis Date  . Allergy   . Arthritis    neck and back  . Blood transfusion without reported diagnosis   . BPH (benign prostatic hyperplasia)   . Cancer [facility]) 2004   testicle  . Chronic kidney disease    kidney stones  . Left lumbar radiculopathy 06/12/2016  . 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 Medical history non-contributory    Pt has scattered thoughts and uncertain of past medical history  . 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 Stones, urinary tract   . Substance abuse ([place])    prescribed oxydcodone- 10 years   Past Surgical History:  Procedure Laterality Date  . BACK SURGERY     x5  . CERVICAL DISC SURGERY     x2  . COLONOSCOPY WITH PROPOFOL N/A 08/11/2016   Procedure: COLONOSCOPY WITH PROPOFOL;  Surgeon: [name], MD;  Location: [facility];  Service: Endoscopy;  Laterality: N/A;  130   .  POLYPECTOMY  08/11/2016   Procedure: POLYPECTOMY;  Surgeon: [name], MD;  Location: [facility];  Service: Endoscopy;;  colon  . testicular cancer  2004  . TONSILLECTOMY       SOCIAL HISTORY:  Social History   Socioeconomic History  . Marital status: Single    Spouse name: Not on file  . Number of children: 1  . Years of education: 72  . Highest  education level: Not on file  Occupational History  . Occupation: retired    Comment: Psychologist, prison and probation services  . Occupation: disabled  Social Needs  . Financial resource strain: Not on file  . Food insecurity:    Worry: Not on file    Inability: Not on file  . Transportation needs:    Medical: Not on file    Non-medical: Not on file  Tobacco Use  . Smoking status: Current Every Day Smoker    Packs/day: 1.00    Years: 40.00    Pack years: 40.00    Types: Cigarettes    Start date: 03/31/1974  . Smokeless tobacco: Never Used  Substance and Sexual Activity  . Alcohol use: Yes    Comment: Occasional  . Drug use: No    Types: Cocaine    Comment: Remote hx of cocaine, quit 1989  . Sexual activity: Yes  Lifestyle  . Physical activity:    Days per week: Not on file    Minutes per session: Not on file  . Stress: Not on file  Relationships  . Social connections:    Talks on phone: Not on file    Gets together: Not on file    Attends religious service: Not on file    Active member of club or organization: Not on file    Attends meetings of clubs or organizations: Not on file    Relationship status: Not on file  . Intimate partner violence:    Fear of current or ex partner: Not on file    Emotionally abused: Not on file    Physically abused: Not on file    Forced sexual activity: Not on file  Other Topics Concern  . Not on file  Social History Narrative   [employer] for 12 years   [employer] for 41 years      Never married   One daughter      Lives alone   Retail banker   Right-handed   Occasional caffeine use          FAMILY HISTORY:  Family History  Problem Relation Age of Onset  . COPD Mother   . Heart disease Mother   . Other Father        Never knew his father.  . Thyroid disease Sister   . Arthritis Sister   . Heart disease Sister        bypass  . Colon cancer Neg Hx     CURRENT MEDICATIONS:  Outpatient Encounter Medications as of 08/13/2018   Medication Sig  . acyclovir (ZOVIRAX) 200 MG capsule Take 200 mg by mouth 2 (two) times daily.  . bortezomib IV (VELCADE) 3.5 MG injection Inject 1.3 mg/m2 into the vein once a week.   . CYCLOPHOSPHAMIDE IV Inject into the vein once a week.  . diphenhydrAMINE (BENADRYL) 25 MG tablet Take 1 tablet (25 mg total) by mouth as needed for itching.  Marland Kitchen HYDROmorphone (DILAUDID) 4 MG tablet Take 1 tablet (4 mg total) by mouth every 8 (eight)  hours as needed for severe pain.  . Misc. Devices MISC Please provide patient with rollaider.  Dx: multiple myeloma C90.0  . protein supplement (RESOURCE BENEPROTEIN) POWD Take 1 Scoop by mouth 3 (three) times daily with meals.  . senna (SENOKOT) 8.6 MG tablet Take 2 tablets by mouth 2 (two) times daily.  . sodium bicarbonate 650 MG tablet Take 1,300 mg by mouth 2 (two) times daily.  Marland Kitchen torsemide (DEMADEX) 20 MG tablet TAKE 2 TABLETS BY MOUTH DAILY FOR 30 DAYS.  . [EXPIRED] 0.9 %  sodium chloride infusion    No facility-administered encounter medications on file as of 08/13/2018.     ALLERGIES:  Allergies  Allergen Reactions  . Acetaminophen Nausea Only and Other (See Comments)    Elevated liver enzymes  . Cymbalta [Duloxetine Hcl] Swelling    Facial swelling  . Diclofenac Sodium Swelling       . Imodium [Loperamide] Swelling    facial  . Lyrica [Pregabalin] Swelling  . Morphine Other (See Comments)    Bradycardia   . Vioxx [Rofecoxib] Swelling  . Aleve [Naproxen] Swelling    eye     PHYSICAL EXAM:  ECOG Performance status: 1  Vitals:   08/13/18 0854  BP: 104/74  Pulse: 83  Resp: 18  Temp: 98 F (36.7 C)  SpO2: 100%   Filed Weights   08/13/18 0854  Weight: 166 lb 9.6 oz (75.6 kg)    Physical Exam Vitals signs reviewed.  Constitutional:      Appearance: Normal appearance.  Cardiovascular:     Rate and Rhythm: Normal rate and regular rhythm.     Heart sounds: Normal heart sounds.  Pulmonary:     Effort: Pulmonary effort is  normal.     Breath sounds: Normal breath sounds.  Abdominal:     General: There is no distension.     Palpations: Abdomen is soft. There is no mass.  Musculoskeletal:     Right lower leg: Edema present.     Left lower leg: Edema present.  Skin:    General: Skin is warm.  Neurological:     General: No focal deficit present.     Mental Status: He is alert and oriented to person, place, and time.  Psychiatric:        Mood and Affect: Mood normal.        Behavior: Behavior normal.      LABORATORY DATA:  I have reviewed the labs as listed.  CBC    Component Value Date/Time   WBC 3.2 (L) 08/13/2018 0827   RBC 2.28 (L) 08/13/2018 0827   HGB 7.6 (L) 08/13/2018 0827   HCT 24.1 (L) 08/13/2018 0827   PLT 186 08/13/2018 0827   MCV 105.7 (H) 08/13/2018 0827   MCH 33.3 08/13/2018 0827   MCHC 31.5 08/13/2018 0827   RDW 20.3 (H) 08/13/2018 0827   LYMPHSABS 0.5 (L) 08/13/2018 0827   MONOABS 1.0 08/13/2018 0827   EOSABS 0.2 08/13/2018 0827   BASOSABS 0.0 08/13/2018 0827   CMP Latest Ref Rng & Units 08/13/2018 08/06/2018 07/29/2018  Glucose 70 - 99 mg/dL 88 90 86  BUN 6 - 20 mg/dL 45(H) 55(H) 42(H)  Creatinine 0.61 - 1.24 mg/dL 4.33(H) 4.64(H) 4.31(H)  Sodium 135 - 145 mmol/L 141 138 136  Potassium 3.5 - 5.1 mmol/L 4.8 6.6(HH) 5.7(H)  Chloride 98 - 111 mmol/L 110 107 105  CO2 22 - 32 mmol/L 20(L) 20(L) 19(L)  Calcium 8.9 - 10.3 mg/dL 6.1(LL) 7.1(L) 7.1(L)  Total Protein 6.5 - 8.1 g/dL 6.5 6.9 7.0  Total Bilirubin 0.3 - 1.2 mg/dL 0.6 0.7 0.7  Alkaline Phos 38 - 126 U/L 46 51 56  AST 15 - 41 U/L 15 14(L) 13(L)  ALT 0 - 44 U/L 10 10 11        DIAGNOSTIC IMAGING:  I have independently reviewed the scans and discussed with the patient.   I have reviewed Venita Lick LPN's note and agree with the documentation.  I personally performed a face-to-face visit, made revisions and my assessment and plan is as follows.    ASSESSMENT & PLAN:   Kappa light chain myeloma ([place]) 1.  Kappa  light chain myeloma: - Presentation with acute renal failure and hypercalcemia. -M spike of 0.4 g.  Serum immunofixation did not show any immunoglobulin heavy chain.  Beta-2 microglobulin of 32.8.  Kappa free light chains of 12,085 and free light chain ratio of 1981.  LDH of 224. - Started on weekly CyBorD on 06/17/2018. 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  Cytogenetic analysis shows complex chromosome aberrations.  Gain of 1 q. was seen.  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 minus; 17p- - We reviewed results of SPEP from 08/05/2018 which increased slightly to 0.3.  Previously it was 0.2 g. - Free kappa light chain are 9982.  Light chain ratio is 04/04/2003.  This has slightly gone up. - The changes in the free light chains could be related to underlying worsening of renal function.  However his kidney function improved today. - He was evaluated by Dr. Joelyn Oms of nephrology yesterday.  He was given a list of low potassium foods. - He will proceed with Velcade and Cytoxan today.  If he is M spike and light chains continue to get worse, we will discontinue cyclophosphamide and consider adding daratumumab. --We have reviewed his blood work.  Hemoglobin slightly improved with Procrit.  Procrit was started on 07/29/2026 dose of 20,000 units. -We will follow-up on kappa light chains and M spike from today. - He may proceed with Velcade and cyclophosphamide today.  I will see him back in 1 week for follow-up.   2.  Hyperkalemia: -Calcium today is 4.8. -He was evaluated by Dr. Joelyn Oms of nephrology.  He was given a list of foods to avoid.  3.  ID prophylaxis: -Acyclovir 200 mg twice daily started on 06/30/2018.  As he started itching all over the body, his acyclovir was discontinued on 07/07/2018. -He is also taking aspirin 81 mg daily. -We will consider restarting it at some point.  4.  Low back pain: -He had rash and itching with oxycodone and hydrocodone. -This is well  controlled with Dilaudid 4 mg every 8 hours.  5.  Lower extremity edema: -He has 1+ edema today.  He will continue torsemide 20 mg daily.  If it gets worse, will increase it to 40 mg daily.      Total time spent is 25 minutes with more than 50% of the time spent face-to-face discussing treatment plan, side effects and coordination of care.    Orders placed this encounter:  Orders Placed This Encounter  Procedures  . CBC with Differential/Platelet  . Comprehensive metabolic panel  . Magnesium      Derek Jack, MD [place] [phone]

## 2018-08-13 NOTE — Assessment & Plan Note (Signed)
1.  Kappa light chain myeloma: - Presentation with acute renal failure and hypercalcemia. -M spike of 0.4 g.  Serum immunofixation did not show any immunoglobulin heavy chain.  Beta-2 microglobulin of 32.8.  Kappa free light chains of 12,085 and free light chain ratio of 1981.  LDH of 224. - Started on weekly CyBorD on 06/17/2018. 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  Cytogenetic analysis shows complex chromosome aberrations.  Gain of 1 q. was seen.  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 minus; 17p- - We reviewed results of SPEP from 08/05/2018 which increased slightly to 0.3.  Previously it was 0.2 g. - Free kappa light chain are 9982.  Light chain ratio is 04/04/2003.  This has slightly gone up. - The changes in the free light chains could be related to underlying worsening of renal function.  However his kidney function improved today. - He was evaluated by Dr. Joelyn Oms of nephrology yesterday.  He was given a list of low potassium foods. - He will proceed with Velcade and Cytoxan today.  If he is M spike and light chains continue to get worse, we will discontinue cyclophosphamide and consider adding daratumumab. --We have reviewed his blood work.  Hemoglobin slightly improved with Procrit.  Procrit was started on 07/29/2026 dose of 20,000 units. -We will follow-up on kappa light chains and M spike from today. - He may proceed with Velcade and cyclophosphamide today.  I will see him back in 1 week for follow-up.   2.  Hyperkalemia: -Calcium today is 4.8. -He was evaluated by Dr. Joelyn Oms of nephrology.  He was given a list of foods to avoid.  3.  ID prophylaxis: -Acyclovir 200 mg twice daily started on 06/30/2018.  As he started itching all over the body, his acyclovir was discontinued on 07/07/2018. -He is also taking aspirin 81 mg daily. -We will consider restarting it at some point.  4.  Low back pain: -He had rash and itching with oxycodone and hydrocodone. -This is  well controlled with Dilaudid 4 mg every 8 hours.  5.  Lower extremity edema: -He has 1+ edema today.  He will continue torsemide 20 mg daily.  If it gets worse, will increase it to 40 mg daily.

## 2018-08-13 NOTE — Patient Instructions (Signed)
[facility] at [facility] Discharge Instructions  You were seen today by Dr. Katragadda. He went over your recent lab results. He will see you back in 1 week for labs, treatment and follow up.   Thank you for choosing [facility] at [facility] to provide your oncology and hematology care.  To afford each patient quality time with our provider, please arrive at least 15 minutes before your scheduled appointment time.   If you have a lab appointment with the Cancer Center please come in thru the  Main Entrance and check in at the main information desk  You need to re-schedule your appointment should you arrive 10 or more minutes late.  We strive to give you quality time with our providers, and arriving late affects you and other patients whose appointments are after yours.  Also, if you no show three or more times for appointments you may be dismissed from the clinic at the providers discretion.     Again, thank you for choosing [facility].  Our hope is that these requests will decrease the amount of time that you wait before being seen by our physicians.       _____________________________________________________________  Should you have questions after your visit to [facility], please contact our office at [phone] between the hours of 8:00 a.m. and 4:30 p.m.  Voicemails left after 4:00 p.m. will not be returned until the following business day.  For prescription refill requests, have your pharmacy contact our office and allow 72 hours.    Cancer Center Support Programs:   > Cancer Support Group  2nd Tuesday of the month 1pm-2pm, Journey Room    

## 2018-08-13 NOTE — Progress Notes (Signed)
CRITICAL VALUE ALERT  Critical Value:  Calcium 6.1  Date & Time Notied:  08/13/2018 at 0942  Provider Notified: Dr. Delton Coombes  Orders Received/Actions taken: give calcium gluconate 1 g IV

## 2018-08-13 NOTE — Progress Notes (Signed)
Give calcium 1 gram IV today for low calcium level per MD.   Demetrius Charity, PharmD, [facility] Pharmacist Pharmacy Phone: [phone] 08/13/2018

## 2018-08-19 ENCOUNTER — Encounter: Payer: Self-pay | Admitting: Gastroenterology

## 2018-08-19 ENCOUNTER — Other Ambulatory Visit: Payer: Self-pay

## 2018-08-19 ENCOUNTER — Ambulatory Visit (INDEPENDENT_AMBULATORY_CARE_PROVIDER_SITE_OTHER): Payer: Medicare Other | Admitting: Gastroenterology

## 2018-08-19 DIAGNOSIS — K625 Hemorrhage of anus and rectum: Secondary | ICD-10-CM | POA: Diagnosis not present

## 2018-08-19 NOTE — Patient Instructions (Signed)
Use the hydrocortisone cream per rectum twice a day per rectum for the next 7 days.   I will be discussing with Dr. Gala Romney further evaluation.   Further recommendations to follow!  It was a pleasure to talk to you today. I strive to create trusting relationships with patients to provide genuine, compassionate, and quality care. I value your feedback. If you receive a survey regarding your visit,  I greatly appreciate you taking time to fill this out.   Annitta Needs, PhD, ANP-BC [facility]

## 2018-08-19 NOTE — Progress Notes (Signed)
Primary Care Physician:  Rosita Fire, MD  Primary GI: Dr. Gala Romney   Patient Location: Home   Provider Location: [facility] office   Reason for Visit: Rectal bleeding   Persons present on the virtual encounter, with roles: Patient and Provider   Total time (minutes) spent on medical discussion: 30 minutes   Due to COVID-19, visit was conducted using virtual method.  Visit was requested by patient.  Virtual Visit via Telephone Note Due to COVID-19, visit is conducted virtually and was requested by patient.   I connected with [name] on 08/19/18 at 10:00 AM EDT by telephone and verified that I am speaking with the correct person using two identifiers.   I discussed the limitations, risks, security and privacy concerns of performing an evaluation and management service by telephone and the availability of in person appointments. I also discussed with the patient that there may be a patient responsible charge related to this service. The patient expressed understanding and agreed to proceed.  Chief Complaint  Patient presents with  . Rectal Bleeding    has had bleeding with every BM x last friday, stringy/clot blood, denies rectal pain  . Abdominal Pain    below naval but once bowels move it goes away, side pain as well (jabby pain all day long).  . Chemotherapy    will go tomorrow.     History of Present Illness: 58 year old male with history of CKD, macrocytic anemia, multiple myeloma, inpatient April 2020 with rectal bleeding. Colonoscopy last completed in 2018 with non-bleeding internal hemorrhoids and 4 mm rectal polyp. Felt to have bleeding from internal hemorrhoids. Visit today for follow-up.    Prior hemorrhoid banding  2018: right anterior, left lateral. Jan 2019: right posterior, band in neutral position. May 2019: left lateral band X 2.   Benefiber in evening. Takes 1 Senna in evenings. Stool in morning is normal. No griping. Just sits down and goes. Blood  looks about like an ounce. Has rectal itching.   Cooked peppers and onions on Friday and used Lawry's salt. Had a BM and passed blood. Since that time has had pools of blood in toilet but "clot" on tissue. Pain below navel. Alternates left and right.  Has injection in abdomen after each chemo and doesn't feel well for a few days. Has a painful sensation and relieved with BM. Supraumbilical abdominal discomfort/burning with BM but relieved thereafter. Has gas. Doesn't feel weak and tired. Feels great. BMs feel productive. Has rash below right shoulder blade, came up all the sudden.   Has pain at right pelvic bone and radiates to navel.    Past Medical History:  Diagnosis Date  . Allergy   . Arthritis    neck and back  . Blood transfusion without reported diagnosis   . BPH (benign prostatic hyperplasia)   . Cancer [facility]) 2004   testicle  . Chronic kidney disease    kidney stones  . Left lumbar radiculopathy 06/12/2016  . 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 Medical history non-contributory    Pt has scattered thoughts and uncertain of past medical history  . 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 Stones, urinary tract   . Substance abuse ([place])    prescribed oxydcodone- 10 years     Past Surgical History:  Procedure Laterality Date  . BACK SURGERY     x5  . CERVICAL DISC SURGERY     x2  . COLONOSCOPY WITH PROPOFOL N/A 08/11/2016   Dr. Gala Romney: non-bleeding  internal hemorrhoids, one 4 mm hyperplastic rectal polyp, diverticulosis in entire examined colon  . POLYPECTOMY  08/11/2016   Procedure: POLYPECTOMY;  Surgeon: [name], MD;  [facility]: [facility];  Service: Endoscopy;;  colon  . testicular cancer  2004  . TONSILLECTOMY       Current Meds  Medication Sig  . acyclovir (ZOVIRAX) 200 MG capsule Take 200 mg by mouth 2 (two) times daily.  . bortezomib IV (VELCADE) 3.5 MG injection Inject 1.3 mg/m2 into the vein once a week.   . calcitRIOL (ROCALTROL) 0.25 MCG capsule Take 1  capsule by mouth daily.  . CYCLOPHOSPHAMIDE IV Inject into the vein once a week.  . diphenhydrAMINE (BENADRYL) 25 MG tablet Take 1 tablet (25 mg total) by mouth as needed for itching.  Marland Kitchen HYDROmorphone (DILAUDID) 4 MG tablet Take 1 tablet (4 mg total) by mouth every 8 (eight) hours as needed for severe pain.  . Misc. Devices MISC Please provide patient with rollaider.  Dx: multiple myeloma C90.0  . protein supplement (RESOURCE BENEPROTEIN) POWD Take 1 Scoop by mouth 3 (three) times daily with meals.  . senna (SENOKOT) 8.6 MG tablet Take 2 tablets by mouth 2 (two) times daily.  . sodium bicarbonate 650 MG tablet Take 1,300 mg by mouth 2 (two) times daily.  Marland Kitchen torsemide (DEMADEX) 20 MG tablet TAKE 2 TABLETS BY MOUTH DAILY FOR 30 DAYS.     Family History  Problem Relation Age of Onset  . COPD Mother   . Heart disease Mother   . Other Father        Never knew his father.  . Thyroid disease Sister   . Arthritis Sister   . Heart disease Sister        bypass  . Colon cancer Neg Hx     Social History   Socioeconomic History  . Marital status: Single    Spouse name: Not on file  . Number of children: 1  . Years of education: 36  . Highest education level: Not on file  Occupational History  . Occupation: retired    Comment: Psychologist, prison and probation services  . Occupation: disabled  Social Needs  . Financial resource strain: Not on file  . Food insecurity:    Worry: Not on file    Inability: Not on file  . Transportation needs:    Medical: Not on file    Non-medical: Not on file  Tobacco Use  . Smoking status: Current Every Day Smoker    Packs/day: 1.00    Years: 40.00    Pack years: 40.00    Types: Cigarettes    Start date: 03/31/1974  . Smokeless tobacco: Never Used  Substance and Sexual Activity  . Alcohol use: Yes    Comment: Occasional  . Drug use: No    Types: Cocaine    Comment: Remote hx of cocaine, quit 1989  . Sexual activity: Yes  Lifestyle  . Physical activity:    Days per week: Not  on file    Minutes per session: Not on file  . Stress: Not on file  Relationships  . Social connections:    Talks on phone: Not on file    Gets together: Not on file    Attends religious service: Not on file    Active member of club or organization: Not on file    Attends meetings of clubs or organizations: Not on file    Relationship status: Not on file  Other Topics Concern  .  Not on file  Social History Narrative   [employer] for 12 years   [employer] for 24 years      Never married   One daughter      Lives alone   Retail banker   Right-handed   Occasional caffeine use             Review of Systems: Gen: Denies fever, chills, anorexia. Denies fatigue, weakness, weight loss.  CV: Denies chest pain, palpitations, syncope, peripheral edema, and claudication. Resp: Denies dyspnea at rest, cough, wheezing, coughing up blood, and pleurisy. GI: see HPI Derm: Denies rash, itching, dry skin Psych: Denies depression, anxiety, memory loss, confusion. No homicidal or suicidal ideation.  Heme: see HPI  Observations/Objective: No distress. Unable to perform physical exam due to telephone encounter. No video available.   Assessment and Plan: 58 year old male with multiple medical issues, recently inpatient with rectal bleeding. Chronic anemia multifactorial. Hgb checked after 5/22, day after this appointment, and was in the 7 range (baseline). He has had likely maximum intervention of internal hemorrhoids as noted previously. I have sent in hydrocortisone to take BID per rectum for the next week. Unclear if early interval colonoscopy warranted at this time to exclude any other etiology for rectal bleeding, although internal hemorrhoids are most likely culprit. To discuss further with Dr. Gala Romney. Keep close follow-up with Oncology.   Follow Up Instructions:    I discussed the assessment and treatment plan with the patient. The patient was provided an opportunity to  ask questions and all were answered. The patient agreed with the plan and demonstrated an understanding of the instructions.   The patient was advised to call back or seek an in-person evaluation if the symptoms worsen or if the condition fails to improve as anticipated.  I provided 30 minutes of non-face-to-face time during this encounter.  Annitta Needs, PhD, ANP-BC [facility]    '

## 2018-08-20 ENCOUNTER — Other Ambulatory Visit (HOSPITAL_COMMUNITY): Payer: Medicare Other

## 2018-08-20 ENCOUNTER — Encounter (HOSPITAL_COMMUNITY): Payer: Self-pay

## 2018-08-20 ENCOUNTER — Encounter (HOSPITAL_COMMUNITY): Payer: Self-pay | Admitting: Hematology

## 2018-08-20 ENCOUNTER — Inpatient Hospital Stay (HOSPITAL_COMMUNITY): Payer: Medicare Other

## 2018-08-20 ENCOUNTER — Inpatient Hospital Stay (HOSPITAL_BASED_OUTPATIENT_CLINIC_OR_DEPARTMENT_OTHER): Payer: Medicare Other | Admitting: Hematology

## 2018-08-20 ENCOUNTER — Other Ambulatory Visit: Payer: Self-pay

## 2018-08-20 VITALS — BP 113/52 | HR 78 | Temp 97.7°F | Resp 18 | Wt 162.3 lb

## 2018-08-20 VITALS — BP 128/59 | HR 74 | Temp 98.3°F | Resp 18

## 2018-08-20 DIAGNOSIS — M545 Low back pain: Secondary | ICD-10-CM

## 2018-08-20 DIAGNOSIS — N189 Chronic kidney disease, unspecified: Secondary | ICD-10-CM

## 2018-08-20 DIAGNOSIS — C9000 Multiple myeloma not having achieved remission: Secondary | ICD-10-CM

## 2018-08-20 DIAGNOSIS — D649 Anemia, unspecified: Secondary | ICD-10-CM

## 2018-08-20 DIAGNOSIS — R600 Localized edema: Secondary | ICD-10-CM

## 2018-08-20 DIAGNOSIS — Z79890 Hormone replacement therapy (postmenopausal): Secondary | ICD-10-CM

## 2018-08-20 DIAGNOSIS — E785 Hyperlipidemia, unspecified: Secondary | ICD-10-CM

## 2018-08-20 DIAGNOSIS — N185 Chronic kidney disease, stage 5: Secondary | ICD-10-CM

## 2018-08-20 DIAGNOSIS — B029 Zoster without complications: Secondary | ICD-10-CM

## 2018-08-20 DIAGNOSIS — F17210 Nicotine dependence, cigarettes, uncomplicated: Secondary | ICD-10-CM

## 2018-08-20 LAB — CBC WITH DIFFERENTIAL/PLATELET
Abs Immature Granulocytes: 0.04 10*3/uL (ref 0.00–0.07)
Basophils Absolute: 0 10*3/uL (ref 0.0–0.1)
Basophils Relative: 1 %
Eosinophils Absolute: 0.1 10*3/uL (ref 0.0–0.5)
Eosinophils Relative: 2 %
HCT: 23.1 % — ABNORMAL LOW (ref 39.0–52.0)
Hemoglobin: 7 g/dL — ABNORMAL LOW (ref 13.0–17.0)
Immature Granulocytes: 1 %
Lymphocytes Relative: 10 %
Lymphs Abs: 0.5 10*3/uL — ABNORMAL LOW (ref 0.7–4.0)
MCH: 32.4 pg (ref 26.0–34.0)
MCHC: 30.3 g/dL (ref 30.0–36.0)
MCV: 106.9 fL — ABNORMAL HIGH (ref 80.0–100.0)
Monocytes Absolute: 0.9 10*3/uL (ref 0.1–1.0)
Monocytes Relative: 18 %
Neutro Abs: 3.3 10*3/uL (ref 1.7–7.7)
Neutrophils Relative %: 68 %
Platelets: 205 10*3/uL (ref 150–400)
RBC: 2.16 MIL/uL — ABNORMAL LOW (ref 4.22–5.81)
RDW: 19.9 % — ABNORMAL HIGH (ref 11.5–15.5)
WBC: 4.9 10*3/uL (ref 4.0–10.5)
nRBC: 0 % (ref 0.0–0.2)

## 2018-08-20 LAB — COMPREHENSIVE METABOLIC PANEL
ALT: 11 U/L (ref 0–44)
AST: 15 U/L (ref 15–41)
Albumin: 3.9 g/dL (ref 3.5–5.0)
Alkaline Phosphatase: 52 U/L (ref 38–126)
Anion gap: 12 (ref 5–15)
BUN: 48 mg/dL — ABNORMAL HIGH (ref 6–20)
CO2: 20 mmol/L — ABNORMAL LOW (ref 22–32)
Calcium: 7 mg/dL — ABNORMAL LOW (ref 8.9–10.3)
Chloride: 111 mmol/L (ref 98–111)
Creatinine, Ser: 4.13 mg/dL — ABNORMAL HIGH (ref 0.61–1.24)
GFR calc Af Amer: 17 mL/min — ABNORMAL LOW (ref >60)
GFR calc non Af Amer: 15 mL/min — ABNORMAL LOW (ref >60)
Glucose, Bld: 92 mg/dL (ref 70–99)
Potassium: 5.3 mmol/L — ABNORMAL HIGH (ref 3.5–5.1)
Sodium: 143 mmol/L (ref 135–145)
Total Bilirubin: 0.6 mg/dL (ref 0.3–1.2)
Total Protein: 6.5 g/dL (ref 6.5–8.1)

## 2018-08-20 LAB — MAGNESIUM: Magnesium: 2 mg/dL (ref 1.7–2.4)

## 2018-08-20 MED ORDER — VALACYCLOVIR HCL 1 G PO TABS: 1000.0000 mg | ORAL_TABLET | Freq: Every day | ORAL | 0 refills | Status: DC

## 2018-08-20 MED ORDER — PALONOSETRON HCL INJECTION 0.25 MG/5ML
0.2500 mg | Freq: Once | INTRAVENOUS | Status: AC
  Administered 2018-08-20: 0.25 mg via INTRAVENOUS
  Filled 2018-08-20: qty 5

## 2018-08-20 MED ORDER — HEPARIN SOD (PORK) LOCK FLUSH 100 UNIT/ML IV SOLN: 500.0000 [IU] | Freq: Once | INTRAVENOUS | Status: DC | PRN

## 2018-08-20 MED ORDER — BORTEZOMIB CHEMO SQ INJECTION 3.5 MG (2.5MG/ML)
1.3000 mg/m2 | Freq: Once | INTRAMUSCULAR | Status: AC
  Administered 2018-08-20: 2.5 mg via SUBCUTANEOUS
  Filled 2018-08-20: qty 1

## 2018-08-20 MED ORDER — SODIUM CHLORIDE 0.9 % IV SOLN
300.0000 mg/m2 | Freq: Once | INTRAVENOUS | Status: AC
  Administered 2018-08-20: 600 mg via INTRAVENOUS
  Filled 2018-08-20: qty 30

## 2018-08-20 MED ORDER — SODIUM CHLORIDE 0.9% FLUSH
10.0000 mL | INTRAVENOUS | Status: DC | PRN
  Administered 2018-08-20: 10 mL
  Filled 2018-08-20: qty 10

## 2018-08-20 MED ORDER — EPOETIN ALFA-EPBX 10000 UNIT/ML IJ SOLN
20000.0000 [IU] | Freq: Once | INTRAMUSCULAR | Status: AC
  Administered 2018-08-20: 20000 [IU] via SUBCUTANEOUS
  Filled 2018-08-20: qty 2

## 2018-08-20 MED ORDER — SODIUM CHLORIDE 0.9 % IV SOLN
Freq: Once | INTRAVENOUS | Status: AC
  Administered 2018-08-20: 11:00:00 via INTRAVENOUS

## 2018-08-20 MED ORDER — SODIUM CHLORIDE 0.9 % IV SOLN
20.0000 mg | Freq: Once | INTRAVENOUS | Status: AC
  Administered 2018-08-20: 20 mg via INTRAVENOUS
  Filled 2018-08-20: qty 20

## 2018-08-20 NOTE — Progress Notes (Signed)
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[place])    06/17/2018 -  Chemotherapy    The patient had palonosetron (ALOXI) injection 0.25 mg, 0.25 mg, Intravenous,  Once, 10 of 12 cycles Administration: 0.25 mg (06/17/2018), 0.25 mg (06/23/2018), 0.25 mg (06/30/2018), 0.25 mg (07/07/2018), 0.25 mg (07/14/2018), 0.25 mg (07/22/2018), 0.25 mg (07/29/2018), 0.25 mg (08/06/2018), 0.25 mg (08/13/2018), 0.25 mg (08/20/2018) bortezomib SQ (VELCADE) chemo injection 2.5 mg, 1.3 mg/m2 = 2.5 mg, Subcutaneous,  Once, 10 of 12 cycles Administration: 2.5 mg (06/17/2018), 2.5 mg (06/23/2018), 2.5 mg (06/30/2018), 2.5 mg (07/07/2018), 2.5 mg (07/14/2018), 2.5 mg (07/22/2018), 2.5 mg (07/29/2018), 2.5 mg (08/06/2018), 2.5 mg (08/13/2018), 2.5 mg (08/20/2018) cyclophosphamide (CYTOXAN) 300 mg in sodium chloride 0.9 % 250 mL chemo infusion, 300 mg (100 % of original dose 300 mg), Intravenous,  Once, 10 of 12 cycles Dose modification: 300 mg (original dose 300 mg, Cycle 1, Reason: Change in SCr/CrCl), 500 mg (original dose 500 mg, Cycle 2, Reason: Provider Judgment), 500 mg (original dose 500 mg, Cycle 3, Reason: Change in SCr/CrCl) Administration: 300 mg (06/17/2018), 500 mg (06/23/2018), 500 mg (06/30/2018), 600 mg (07/07/2018), 600 mg (07/14/2018), 600 mg (07/22/2018), 600 mg (07/29/2018), 600 mg (08/06/2018), 600 mg (08/13/2018), 600 mg (08/20/2018)  for chemotherapy treatment.       CANCER STAGING: Cancer Staging No matching staging information was found for the patient.   INTERVAL HISTORY:  [name] 58 y.o. male returns for follow-up of multiple myeloma and weekly treatments.  He reportedly developed a rash on his  right posterior chest wall few days ago.  He thought the rash developed from him starting on calcitriol given by Dr. Joelyn Oms. He was also given Veltassa by Dr. Joelyn Oms.  However patient did not start taking them yet.  Denies any tingling or numbness extremities.  Denies any nausea vomiting or diarrhea.  Is able to eat well.  He cooked food with seasoned salt in his leg started swelling.  Appetite is 100%.  Energy levels are 75%.   REVIEW OF SYSTEMS:  Review of Systems  Constitutional: Positive for fatigue.  Gastrointestinal: Positive for nausea.  Musculoskeletal: Positive for back pain.  Psychiatric/Behavioral: The patient is nervous/anxious.      PAST MEDICAL/SURGICAL HISTORY:  Past Medical History:  Diagnosis Date  . Allergy   . Arthritis    neck and back  . Blood transfusion without reported diagnosis   . BPH (benign prostatic hyperplasia)   . Cancer [facility]) 2004   testicle  . Chronic kidney disease    kidney stones  . Left lumbar radiculopathy 06/12/2016  . 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 Medical history non-contributory    Pt has scattered thoughts and uncertain of past medical history  . 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 Stones, urinary tract   . Substance abuse ([place])    prescribed oxydcodone- 10 years   Past Surgical History:  Procedure Laterality Date  . BACK SURGERY     x5  . CERVICAL DISC SURGERY     x2  . COLONOSCOPY WITH PROPOFOL N/A 08/11/2016   Dr. Gala Romney: non-bleeding internal hemorrhoids, one 4 mm hyperplastic rectal polyp, diverticulosis in entire  examined colon  . POLYPECTOMY  08/11/2016   Procedure: POLYPECTOMY;  Surgeon: [name], MD;  Location: [facility];  Service: Endoscopy;;  colon  . testicular cancer  2004  . TONSILLECTOMY       SOCIAL HISTORY:  Social History   Socioeconomic History  . Marital status: Single    Spouse name: Not on file  . Number of children: 1  . Years of education: 33  . Highest education level: Not on file   Occupational History  . Occupation: retired    Comment: Psychologist, prison and probation services  . Occupation: disabled  Social Needs  . Financial resource strain: Not on file  . Food insecurity:    Worry: Not on file    Inability: Not on file  . Transportation needs:    Medical: Not on file    Non-medical: Not on file  Tobacco Use  . Smoking status: Current Every Day Smoker    Packs/day: 1.00    Years: 40.00    Pack years: 40.00    Types: Cigarettes    Start date: 03/31/1974  . Smokeless tobacco: Never Used  Substance and Sexual Activity  . Alcohol use: Yes    Comment: Occasional  . Drug use: No    Types: Cocaine    Comment: Remote hx of cocaine, quit 1989  . Sexual activity: Yes  Lifestyle  . Physical activity:    Days per week: Not on file    Minutes per session: Not on file  . Stress: Not on file  Relationships  . Social connections:    Talks on phone: Not on file    Gets together: Not on file    Attends religious service: Not on file    Active member of club or organization: Not on file    Attends meetings of clubs or organizations: Not on file    Relationship status: Not on file  . Intimate partner violence:    Fear of current or ex partner: Not on file    Emotionally abused: Not on file    Physically abused: Not on file    Forced sexual activity: Not on file  Other Topics Concern  . 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 COPD Mother   . Heart disease Mother   . Other Father        Never knew his father.  . Thyroid disease Sister   . Arthritis Sister   . Heart disease Sister        bypass  . Colon cancer Neg Hx     CURRENT MEDICATIONS:  Outpatient Encounter Medications as of 08/20/2018  Medication Sig  . bortezomib IV  (VELCADE) 3.5 MG injection Inject 1.3 mg/m2 into the vein once a week.   . calcitRIOL (ROCALTROL) 0.25 MCG capsule Take 1 capsule by mouth daily.  . CYCLOPHOSPHAMIDE IV Inject into the vein once a week.  . diphenhydrAMINE (BENADRYL) 25 MG tablet Take 1 tablet (25 mg total) by mouth as needed for itching.  Marland Kitchen HYDROmorphone (DILAUDID) 4 MG tablet Take 1 tablet (4 mg total) by mouth every 8 (  eight) hours as needed for severe pain.  Marland Kitchen lactulose (CHRONULAC) 10 GM/15ML solution Take 10 g by mouth as needed for mild constipation.  . Misc. Devices MISC Please provide patient with rollaider.  Dx: multiple myeloma C90.0  . protein supplement (RESOURCE BENEPROTEIN) POWD Take 1 Scoop by mouth 3 (three) times daily with meals.  . senna (SENOKOT) 8.6 MG tablet Take 2 tablets by mouth 2 (two) times daily.  . sodium bicarbonate 650 MG tablet Take 1,300 mg by mouth 2 (two) times daily.  Marland Kitchen torsemide (DEMADEX) 20 MG tablet TAKE 2 TABLETS BY MOUTH DAILY FOR 30 DAYS.  Marland Kitchen acyclovir (ZOVIRAX) 200 MG capsule Take 200 mg by mouth 2 (two) times daily.  . valACYclovir (VALTREX) 1000 MG tablet Take 1 tablet (1,000 mg total) by mouth daily.   No facility-administered encounter medications on file as of 08/20/2018.     ALLERGIES:  Allergies  Allergen Reactions  . Acetaminophen Nausea Only and Other (See Comments)    Elevated liver enzymes  . Cymbalta [Duloxetine Hcl] Swelling    Facial swelling  . Diclofenac Sodium Swelling       . Imodium [Loperamide] Swelling    facial  . Lyrica [Pregabalin] Swelling  . Morphine Other (See Comments)    Bradycardia   . Vioxx [Rofecoxib] Swelling  . Aleve [Naproxen] Swelling    eye     PHYSICAL EXAM:  ECOG Performance status: 1  Vitals:   08/20/18 0854  BP: (!) 113/52  Pulse: 78  Resp: 18  Temp: 97.7 F (36.5 C)  SpO2: 100%   Filed Weights   08/20/18 0854  Weight: 162 lb 4.8 oz (73.6 kg)    Physical Exam Vitals signs reviewed.  Constitutional:       Appearance: Normal appearance.  Cardiovascular:     Rate and Rhythm: Normal rate and regular rhythm.     Heart sounds: Normal heart sounds.  Pulmonary:     Effort: Pulmonary effort is normal.     Breath sounds: Normal breath sounds.  Abdominal:     General: There is no distension.     Palpations: Abdomen is soft. There is no mass.  Musculoskeletal:     Right lower leg: Edema present.     Left lower leg: Edema present.  Skin:    General: Skin is warm.  Neurological:     General: No focal deficit present.     Mental Status: He is alert and oriented to person, place, and time.  Psychiatric:        Mood and Affect: Mood normal.        Behavior: Behavior normal.    There is erythematous papular rash with small blisters on top.  This is mainly below the right scapula in a dermatomal distribution.  LABORATORY DATA:  I have reviewed the labs as listed.  CBC    Component Value Date/Time   WBC 4.9 08/20/2018 0826   RBC 2.16 (L) 08/20/2018 0826   HGB 7.0 (L) 08/20/2018 0826   HCT 23.1 (L) 08/20/2018 0826   PLT 205 08/20/2018 0826   MCV 106.9 (H) 08/20/2018 0826   MCH 32.4 08/20/2018 0826   MCHC 30.3 08/20/2018 0826   RDW 19.9 (H) 08/20/2018 0826   LYMPHSABS 0.5 (L) 08/20/2018 0826   MONOABS 0.9 08/20/2018 0826   EOSABS 0.1 08/20/2018 0826   BASOSABS 0.0 08/20/2018 0826   CMP Latest Ref Rng & Units 08/20/2018 08/13/2018 08/06/2018  Glucose 70 - 99 mg/dL 92 88 90  BUN  6 - 20 mg/dL 48(H) 45(H) 55(H)  Creatinine 0.61 - 1.24 mg/dL 4.13(H) 4.33(H) 4.64(H)  Sodium 135 - 145 mmol/L 143 141 138  Potassium 3.5 - 5.1 mmol/L 5.3(H) 4.8 6.6(HH)  Chloride 98 - 111 mmol/L 111 110 107  CO2 22 - 32 mmol/L 20(L) 20(L) 20(L)  Calcium 8.9 - 10.3 mg/dL 7.0(L) 6.1(LL) 7.1(L)  Total Protein 6.5 - 8.1 g/dL 6.5 6.5 6.9  Total Bilirubin 0.3 - 1.2 mg/dL 0.6 0.6 0.7  Alkaline Phos 38 - 126 U/L 52 46 51  AST 15 - 41 U/L 15 15 14(L)  ALT 0 - 44 U/L _0 DIAGNOSTIC IMAGING:  I have  independently reviewed the scans and discussed with the patient.   I have reviewed Venita Lick LPN's note and agree with the documentation.  I personally performed a face-to-face visit, made revisions and my assessment and plan is as follows.    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  Kappa light chain myeloma: - Presentation with acute renal failure and hypercalcemia. -M spike of 0.4 g.  Serum immunofixation did not show any immunoglobulin heavy chain.  Beta-2 microglobulin of 32.8.  Kappa free light chains of 12,085 and free light chain ratio of 1981.  LDH of 224. - Started on weekly CyBorD on 06/17/2018. 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  Cytogenetic analysis shows complex chromosome aberrations.  Gain of 1 q. was seen.  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 minus; 17p- - We reviewed results of SPEP from 08/05/2018 which increased slightly to 0.3.  Previously it was 0.2 g. - Free kappa light chain are 9982.  Light chain ratio is 04/04/2003.  This has slightly gone up. - The changes in the free light chains could be related to underlying worsening of renal function.  However his kidney function improved today. - He follows up with Dr. Joelyn Oms of nephrology. - He will proceed with Velcade and Cytoxan today.  If he is M spike and light chains continue to get worse, we will discontinue cyclophosphamide and consider adding daratumumab. - Procrit 20,000 units weekly was started on 07/29/2018.  He is tolerating it well.   -We will check his free light chains and M spike next week.  He may proceed with his treatment today. - Clinically it looks like shingles with erythematous maculopapular rash on the right posterior chest wall in a dermatomal distribution.  I will start him on Valtrex thousand milligrams daily for 7 days adjusted to his renal function.  2.  Hyperkalemia: -Potassium is 5.3 today. -Dr. Joelyn Oms has given him samples of Veltassa.  He has  not taken them yet.  I have told him to start taking it once a day. -He was also told to follow the list of potassium low foods given by Dr. Joelyn Oms.  3.  ID prophylaxis: -He did get a rash with acyclovir.  4.  Low back pain: -He had rash and itching with oxycodone and hydrocodone. -This is well controlled with Dilaudid 4 mg every 8 hours.  5.  Lower extremity edema: -He has 2+ edema today.  He is taking torsemide 20 mg daily.  I have told him to increase it to 40 mg on days of worsening edema.      Total time spent is 25 minutes with more than 50% of the time spent face-to-face discussing treatment plan, side effects and coordination of care.  Orders placed this encounter:  Orders Placed This Encounter  Procedures  . Protein electrophoresis, serum  . Kappa/lambda light chains  . CBC with Differential  . Comprehensive metabolic panel  . Magnesium      Derek Jack, MD [place] [phone]

## 2018-08-20 NOTE — Progress Notes (Signed)
Treatment given per orders. Patient tolerated it well without problems. Vitals stable and discharged home from clinic via wheelchair Follow up as scheduled.  

## 2018-08-20 NOTE — Assessment & Plan Note (Addendum)
1.  Kappa light chain myeloma: - Presentation with acute renal failure and hypercalcemia. -M spike of 0.4 g.  Serum immunofixation did not show any immunoglobulin heavy chain.  Beta-2 microglobulin of 32.8.  Kappa free light chains of 12,085 and free light chain ratio of 1981.  LDH of 224. - Started on weekly CyBorD on 06/17/2018. 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  Cytogenetic analysis shows complex chromosome aberrations.  Gain of 1 q. was seen.  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 minus; 17p- - We reviewed results of SPEP from 08/05/2018 which increased slightly to 0.3.  Previously it was 0.2 g. - Free kappa light chain are 9982.  Light chain ratio is 04/04/2003.  This has slightly gone up. - The changes in the free light chains could be related to underlying worsening of renal function.  However his kidney function improved today. - He follows up with Dr. Sanford of nephrology. - He will proceed with Velcade and Cytoxan today.  If he is M spike and light chains continue to get worse, we will discontinue cyclophosphamide and consider adding daratumumab. - Procrit 20,000 units weekly was started on 07/29/2018.  He is tolerating it well.   -We will check his free light chains and M spike next week.  He may proceed with his treatment today. - Clinically it looks like shingles with erythematous maculopapular rash on the right posterior chest wall in a dermatomal distribution.  I will start him on Valtrex thousand milligrams daily for 7 days adjusted to his renal function.  2.  Hyperkalemia: -Potassium is 5.3 today. -Dr. Sanford has given him samples of Veltassa.  He has not taken them yet.  I have told him to start taking it once a day. -He was also told to follow the list of potassium low foods given by Dr. Sanford.  3.  ID prophylaxis: -He did get a rash with acyclovir.  4.  Low back pain: -He had rash and itching with oxycodone and hydrocodone. -This is well  controlled with Dilaudid 4 mg every 8 hours.  5.  Lower extremity edema: -He has 2+ edema today.  He is taking torsemide 20 mg daily.  I have told him to increase it to 40 mg on days of worsening edema.     

## 2018-08-20 NOTE — Patient Instructions (Signed)
[facility] Discharge Instructions for Patients Receiving Chemotherapy  Today you received the following chemotherapy agents   To help prevent nausea and vomiting after your treatment, we encourage you to take your nausea medication   If you develop nausea and vomiting that is not controlled by your nausea medication, call the clinic.   BELOW ARE SYMPTOMS THAT SHOULD BE REPORTED IMMEDIATELY:  *FEVER GREATER THAN 100.5 F  *CHILLS WITH OR WITHOUT FEVER  NAUSEA AND VOMITING THAT IS NOT CONTROLLED WITH YOUR NAUSEA MEDICATION  *UNUSUAL SHORTNESS OF BREATH  *UNUSUAL BRUISING OR BLEEDING  TENDERNESS IN MOUTH AND THROAT WITH OR WITHOUT PRESENCE OF ULCERS  *URINARY PROBLEMS  *BOWEL PROBLEMS  UNUSUAL RASH Items with * indicate a potential emergency and should be followed up as soon as possible.  Feel free to call the clinic should you have any questions or concerns. The clinic phone number is [phone].  Please show the CHEMO ALERT CARD at check-in to the Emergency Department and triage nurse.   

## 2018-08-20 NOTE — Progress Notes (Signed)
To treatment room for chemotherapy and oncology follow up visit.  Patient stated he has been passing blood with bowel movements since last Friday after his treatment.  Stated clots and "maroon" colored.  The patient stated the blood turned to "streaking" after a couple of days and continues to have blood streaking with bowel movements.  The patient denied rectal pain. He had a phone visit with Dr. Roseanne Kaufman office yesterday and was instructed he needed to be set up for a colonoscopy.   He also stated his nephrologist started him on Calcitriol and he now has a rash on his right shoulder.  Dr. Delton Coombes notified for oncology follow up visit.  No s/s of distress noted.    Patient seen by Dr. Delton Coombes with lab review Hgb 7.0 today with verbal order ok to treat.

## 2018-08-24 ENCOUNTER — Encounter: Payer: Self-pay | Admitting: Gastroenterology

## 2018-08-25 NOTE — Progress Notes (Signed)
CC'ED TO PCP 

## 2018-08-27 ENCOUNTER — Inpatient Hospital Stay (HOSPITAL_COMMUNITY): Payer: Medicare Other

## 2018-08-27 ENCOUNTER — Other Ambulatory Visit: Payer: Self-pay

## 2018-08-27 ENCOUNTER — Inpatient Hospital Stay (HOSPITAL_COMMUNITY): Payer: Medicare Other | Attending: Hematology | Admitting: Hematology

## 2018-08-27 ENCOUNTER — Encounter (HOSPITAL_COMMUNITY): Payer: Self-pay | Admitting: Hematology

## 2018-08-27 VITALS — BP 94/65 | HR 58 | Temp 98.0°F | Resp 18

## 2018-08-27 DIAGNOSIS — C9000 Multiple myeloma not having achieved remission: Secondary | ICD-10-CM | POA: Diagnosis present

## 2018-08-27 DIAGNOSIS — Z9221 Personal history of antineoplastic chemotherapy: Secondary | ICD-10-CM | POA: Diagnosis not present

## 2018-08-27 DIAGNOSIS — F17210 Nicotine dependence, cigarettes, uncomplicated: Secondary | ICD-10-CM

## 2018-08-27 DIAGNOSIS — Z79899 Other long term (current) drug therapy: Secondary | ICD-10-CM | POA: Diagnosis not present

## 2018-08-27 DIAGNOSIS — N185 Chronic kidney disease, stage 5: Secondary | ICD-10-CM

## 2018-08-27 LAB — CBC WITH DIFFERENTIAL/PLATELET
Abs Immature Granulocytes: 0.04 10*3/uL (ref 0.00–0.07)
Basophils Absolute: 0 10*3/uL (ref 0.0–0.1)
Basophils Relative: 1 %
Eosinophils Absolute: 0.1 10*3/uL (ref 0.0–0.5)
Eosinophils Relative: 2 %
HCT: 23.9 % — ABNORMAL LOW (ref 39.0–52.0)
Hemoglobin: 7.6 g/dL — ABNORMAL LOW (ref 13.0–17.0)
Immature Granulocytes: 1 %
Lymphocytes Relative: 19 %
Lymphs Abs: 0.9 10*3/uL (ref 0.7–4.0)
MCH: 33.5 pg (ref 26.0–34.0)
MCHC: 31.8 g/dL (ref 30.0–36.0)
MCV: 105.3 fL — ABNORMAL HIGH (ref 80.0–100.0)
Monocytes Absolute: 1 10*3/uL (ref 0.1–1.0)
Monocytes Relative: 21 %
Neutro Abs: 2.7 10*3/uL (ref 1.7–7.7)
Neutrophils Relative %: 56 %
Platelets: 258 10*3/uL (ref 150–400)
RBC: 2.27 MIL/uL — ABNORMAL LOW (ref 4.22–5.81)
RDW: 19.8 % — ABNORMAL HIGH (ref 11.5–15.5)
WBC: 4.7 10*3/uL (ref 4.0–10.5)
nRBC: 0 % (ref 0.0–0.2)

## 2018-08-27 LAB — COMPREHENSIVE METABOLIC PANEL
ALT: 13 U/L (ref 0–44)
AST: 15 U/L (ref 15–41)
Albumin: 3.9 g/dL (ref 3.5–5.0)
Alkaline Phosphatase: 56 U/L (ref 38–126)
Anion gap: 13 (ref 5–15)
BUN: 46 mg/dL — ABNORMAL HIGH (ref 6–20)
CO2: 18 mmol/L — ABNORMAL LOW (ref 22–32)
Calcium: 7.1 mg/dL — ABNORMAL LOW (ref 8.9–10.3)
Chloride: 108 mmol/L (ref 98–111)
Creatinine, Ser: 4.39 mg/dL — ABNORMAL HIGH (ref 0.61–1.24)
GFR calc Af Amer: 16 mL/min — ABNORMAL LOW (ref >60)
GFR calc non Af Amer: 14 mL/min — ABNORMAL LOW (ref >60)
Glucose, Bld: 77 mg/dL (ref 70–99)
Potassium: 5 mmol/L (ref 3.5–5.1)
Sodium: 139 mmol/L (ref 135–145)
Total Bilirubin: 0.5 mg/dL (ref 0.3–1.2)
Total Protein: 6.6 g/dL (ref 6.5–8.1)

## 2018-08-27 LAB — MAGNESIUM: Magnesium: 2 mg/dL (ref 1.7–2.4)

## 2018-08-27 MED ORDER — SODIUM CHLORIDE 0.9 % IV SOLN
Freq: Once | INTRAVENOUS | Status: AC
  Administered 2018-08-27: 12:00:00 via INTRAVENOUS

## 2018-08-27 MED ORDER — SODIUM CHLORIDE 0.9 % IV SOLN
20.0000 mg | Freq: Once | INTRAVENOUS | Status: AC
  Administered 2018-08-27: 20 mg via INTRAVENOUS
  Filled 2018-08-27: qty 2

## 2018-08-27 MED ORDER — EPOETIN ALFA-EPBX 10000 UNIT/ML IJ SOLN
20000.0000 [IU] | Freq: Once | INTRAMUSCULAR | Status: AC
  Administered 2018-08-27: 20000 [IU] via SUBCUTANEOUS
  Filled 2018-08-27: qty 2

## 2018-08-27 MED ORDER — SODIUM CHLORIDE 0.9 % IV SOLN
300.0000 mg/m2 | Freq: Once | INTRAVENOUS | Status: AC
  Administered 2018-08-27: 600 mg via INTRAVENOUS
  Filled 2018-08-27: qty 30

## 2018-08-27 MED ORDER — PALONOSETRON HCL INJECTION 0.25 MG/5ML
0.2500 mg | Freq: Once | INTRAVENOUS | Status: AC
  Administered 2018-08-27: 0.25 mg via INTRAVENOUS
  Filled 2018-08-27: qty 5

## 2018-08-27 MED ORDER — BORTEZOMIB CHEMO SQ INJECTION 3.5 MG (2.5MG/ML)
1.3000 mg/m2 | Freq: Once | INTRAMUSCULAR | Status: AC
  Administered 2018-08-27: 2.5 mg via SUBCUTANEOUS
  Filled 2018-08-27: qty 1

## 2018-08-27 MED ORDER — HYDROMORPHONE HCL 4 MG PO TABS: 4.0000 mg | ORAL_TABLET | Freq: Three times a day (TID) | ORAL | 0 refills | Status: DC | PRN

## 2018-08-27 NOTE — Patient Instructions (Addendum)
[facility] at [facility] Discharge Instructions  You were seen today by Dr. Katragadda. He went over your recent lab results. He will see you back in 1 week for labs and follow up.   Thank you for choosing [facility] at [facility] to provide your oncology and hematology care.  To afford each patient quality time with our provider, please arrive at least 15 minutes before your scheduled appointment time.   If you have a lab appointment with the Cancer Center please come in thru the  Main Entrance and check in at the main information desk  You need to re-schedule your appointment should you arrive 10 or more minutes late.  We strive to give you quality time with our providers, and arriving late affects you and other patients whose appointments are after yours.  Also, if you no show three or more times for appointments you may be dismissed from the clinic at the providers discretion.     Again, thank you for choosing [facility].  Our hope is that these requests will decrease the amount of time that you wait before being seen by our physicians.       _____________________________________________________________  Should you have questions after your visit to  Cancer Center, please contact our office at [phone] between the hours of 8:00 a.m. and 4:30 p.m.  Voicemails left after 4:00 p.m. will not be returned until the following business day.  For prescription refill requests, have your pharmacy contact our office and allow 72 hours.    Cancer Center Support Programs:   > Cancer Support Group  2nd Tuesday of the month 1pm-2pm, Journey Room    

## 2018-08-27 NOTE — Patient Instructions (Signed)
[facility] Discharge Instructions for Patients Receiving Chemotherapy  Today you received the following chemotherapy agents   To help prevent nausea and vomiting after your treatment, we encourage you to take your nausea medication   If you develop nausea and vomiting that is not controlled by your nausea medication, call the clinic.   BELOW ARE SYMPTOMS THAT SHOULD BE REPORTED IMMEDIATELY:  *FEVER GREATER THAN 100.5 F  *CHILLS WITH OR WITHOUT FEVER  NAUSEA AND VOMITING THAT IS NOT CONTROLLED WITH YOUR NAUSEA MEDICATION  *UNUSUAL SHORTNESS OF BREATH  *UNUSUAL BRUISING OR BLEEDING  TENDERNESS IN MOUTH AND THROAT WITH OR WITHOUT PRESENCE OF ULCERS  *URINARY PROBLEMS  *BOWEL PROBLEMS  UNUSUAL RASH Items with * indicate a potential emergency and should be followed up as soon as possible.  Feel free to call the clinic should you have any questions or concerns. The clinic phone number is [phone].  Please show the CHEMO ALERT CARD at check-in to the Emergency Department and triage nurse.   

## 2018-08-27 NOTE — Addendum Note (Signed)
Addended by: Derek Jack on: 08/27/2018 06:13 PM   Modules accepted: Orders

## 2018-08-27 NOTE — Progress Notes (Signed)
Labs reviewed with MD today, proceed with treatment per MD.   Patient agreed to consult for a port a cath placement. Dr. Delton Coombes made aware.  Reticrit given today per orders. Velcade given today per orders. Cytoxan given today per orders. Patient tolerated it well without problems. Vitals stable and discharged home from clinic via wheelchair. Follow up as scheduled.

## 2018-08-27 NOTE — Progress Notes (Signed)
[place], [place] [address]   CLINIC:  Medical Oncology/Hematology  PCP:  Rosita Fire, MD [place] [address] [phone]   REASON FOR VISIT:  Follow-up for multiple myeloma   BRIEF ONCOLOGIC HISTORY:    Kappa light chain myeloma ([place])   06/16/2018 Initial Diagnosis    Kappa light chain myeloma ([place])    06/17/2018 -  Chemotherapy    The patient had palonosetron (ALOXI) injection 0.25 mg, 0.25 mg, Intravenous,  Once, 11 of 12 cycles Administration: 0.25 mg (06/17/2018), 0.25 mg (06/23/2018), 0.25 mg (06/30/2018), 0.25 mg (07/07/2018), 0.25 mg (07/14/2018), 0.25 mg (07/22/2018), 0.25 mg (07/29/2018), 0.25 mg (08/06/2018), 0.25 mg (08/13/2018), 0.25 mg (08/20/2018), 0.25 mg (08/27/2018) bortezomib SQ (VELCADE) chemo injection 2.5 mg, 1.3 mg/m2 = 2.5 mg, Subcutaneous,  Once, 11 of 12 cycles Administration: 2.5 mg (06/17/2018), 2.5 mg (06/23/2018), 2.5 mg (06/30/2018), 2.5 mg (07/07/2018), 2.5 mg (07/14/2018), 2.5 mg (07/22/2018), 2.5 mg (07/29/2018), 2.5 mg (08/06/2018), 2.5 mg (08/13/2018), 2.5 mg (08/20/2018), 2.5 mg (08/27/2018) cyclophosphamide (CYTOXAN) 300 mg in sodium chloride 0.9 % 250 mL chemo infusion, 300 mg (100 % of original dose 300 mg), Intravenous,  Once, 11 of 12 cycles Dose modification: 300 mg (original dose 300 mg, Cycle 1, Reason: Change in SCr/CrCl), 500 mg (original dose 500 mg, Cycle 2, Reason: Provider Judgment), 500 mg (original dose 500 mg, Cycle 3, Reason: Change in SCr/CrCl) Administration: 300 mg (06/17/2018), 500 mg (06/23/2018), 500 mg (06/30/2018), 600 mg (07/07/2018), 600 mg (07/14/2018), 600 mg (07/22/2018), 600 mg (07/29/2018), 600 mg (08/06/2018), 600 mg (08/13/2018), 600 mg (08/20/2018), 600 mg (08/27/2018)  for chemotherapy treatment.       CANCER STAGING: Cancer Staging No matching staging information was found for the patient.   INTERVAL HISTORY:  [name] 58 y.o. male returns for follow-up of multiple myeloma  on weekly treatments.  Denies any fevers, night sweats or weight loss.  Completed taking Valtrex and the rash and pain associated with it has improved in the right upper back.  Denies any chest pains or lightheadedness.  He has been taking torsemide 20 mg and took 40 mg today.  Appetite and energy levels are 75%.   REVIEW OF SYSTEMS:  Review of Systems  Constitutional: Negative for fatigue.  Cardiovascular: Positive for leg swelling.  Gastrointestinal: Negative for nausea.  Musculoskeletal: Positive for back pain.  Psychiatric/Behavioral: The patient is nervous/anxious.      PAST MEDICAL/SURGICAL HISTORY:  Past Medical History:  Diagnosis Date  . Allergy   . Arthritis    neck and back  . Blood transfusion without reported diagnosis   . BPH (benign prostatic hyperplasia)   . Cancer [facility]) 2004   testicle  . Chronic kidney disease    kidney stones  . Left lumbar radiculopathy 06/12/2016  . 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 Medical history non-contributory    Pt has scattered thoughts and uncertain of past medical history  . 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 Stones, urinary tract   . Substance abuse ([place])    prescribed oxydcodone- 10 years   Past Surgical History:  Procedure Laterality Date  . BACK SURGERY     x5  . CERVICAL DISC SURGERY     x2  . COLONOSCOPY WITH PROPOFOL N/A 08/11/2016   Dr. Gala Romney: non-bleeding internal hemorrhoids, one 4 mm hyperplastic rectal polyp, diverticulosis in entire examined colon  . POLYPECTOMY  08/11/2016   Procedure: POLYPECTOMY;  Surgeon: [name], MD;  Location: [facility];  Service: Endoscopy;;  colon  . testicular cancer  2004  . TONSILLECTOMY       SOCIAL HISTORY:  Social History   Socioeconomic History  . Marital status: Single    Spouse name: Not on file  . Number of children: 1  . Years of education: 15  . Highest education level: Not on file  Occupational History  . Occupation: retired    Comment: Psychologist, prison and probation services  .  Occupation: disabled  Social Needs  . Financial resource strain: Not on file  . Food insecurity:    Worry: Not on file    Inability: Not on file  . Transportation needs:    Medical: Not on file    Non-medical: Not on file  Tobacco Use  . Smoking status: Current Every Day Smoker    Packs/day: 1.00    Years: 40.00    Pack years: 40.00    Types: Cigarettes    Start date: 03/31/1974  . Smokeless tobacco: Never Used  Substance and Sexual Activity  . Alcohol use: Yes    Comment: Occasional  . Drug use: No    Types: Cocaine    Comment: Remote hx of cocaine, quit 1989  . Sexual activity: Yes  Lifestyle  . Physical activity:    Days per week: Not on file    Minutes per session: Not on file  . Stress: Not on file  Relationships  . Social connections:    Talks on phone: Not on file    Gets together: Not on file    Attends religious service: Not on file    Active member of club or organization: Not on file    Attends meetings of clubs or organizations: Not on file    Relationship status: Not on file  . Intimate partner violence:    Fear of current or ex partner: Not on file    Emotionally abused: Not on file    Physically abused: Not on file    Forced sexual activity: Not on file  Other Topics Concern  . Not on file  Social History Narrative   [employer] for 12 years   [employer] for 67 years      Never married   One daughter      Lives alone   Retail banker   Right-handed   Occasional caffeine use          FAMILY HISTORY:  Family History  Problem Relation Age of Onset  . COPD Mother   . Heart disease Mother   . Other Father        Never knew his father.  . Thyroid disease Sister   . Arthritis Sister   . Heart disease Sister        bypass  . Colon cancer Neg Hx     CURRENT MEDICATIONS:  Outpatient Encounter Medications as of 08/27/2018  Medication Sig  . acyclovir (ZOVIRAX) 200 MG capsule Take 200 mg by mouth daily.   . bortezomib IV  (VELCADE) 3.5 MG injection Inject 1.3 mg/m2 into the vein once a week.   . calcitRIOL (ROCALTROL) 0.25 MCG capsule Take 1 capsule by mouth daily.  . CYCLOPHOSPHAMIDE IV Inject into the vein once a week.  . diphenhydrAMINE (BENADRYL) 25 MG tablet Take 1 tablet (25 mg total) by mouth as needed for itching.  Marland Kitchen HYDROmorphone (DILAUDID) 4 MG tablet Take 1 tablet (4 mg total) by mouth every 8 (eight) hours as needed  for severe pain.  Marland Kitchen lactulose (CHRONULAC) 10 GM/15ML solution Take 10 g by mouth as needed for mild constipation.  . Misc. Devices MISC Please provide patient with rollaider.  Dx: multiple myeloma C90.0  . protein supplement (RESOURCE BENEPROTEIN) POWD Take 1 Scoop by mouth 3 (three) times daily with meals.  . senna (SENOKOT) 8.6 MG tablet Take 2 tablets by mouth 2 (two) times daily.  . sodium bicarbonate 650 MG tablet Take 1,300 mg by mouth 2 (two) times daily.  Marland Kitchen torsemide (DEMADEX) 20 MG tablet TAKE 2 TABLETS BY MOUTH DAILY FOR 30 DAYS. (Patient taking differently: Pt taking differently-taking 1 tablet daily, and 2 tablets only if his ankles are really swollen)  . valACYclovir (VALTREX) 1000 MG tablet Take 1 tablet (1,000 mg total) by mouth daily. (Patient not taking: Reported on 08/27/2018)   No facility-administered encounter medications on file as of 08/27/2018.     ALLERGIES:  Allergies  Allergen Reactions  . Acetaminophen Nausea Only and Other (See Comments)    Elevated liver enzymes  . Cymbalta [Duloxetine Hcl] Swelling    Facial swelling  . Diclofenac Sodium Swelling       . Imodium [Loperamide] Swelling    facial  . Lyrica [Pregabalin] Swelling  . Morphine Other (See Comments)    Bradycardia   . Vioxx [Rofecoxib] Swelling  . Aleve [Naproxen] Swelling    eye     PHYSICAL EXAM:  ECOG Performance status: 1  Vitals:   08/27/18 1006  Pulse: (!) 117  Resp: 18  Temp: 98.4 F (36.9 C)  SpO2: 94%   Filed Weights   08/27/18 1006  Weight: 165 lb 12.8 oz (75.2  kg)    Physical Exam Vitals signs reviewed.  Constitutional:      Appearance: Normal appearance.  Cardiovascular:     Rate and Rhythm: Normal rate and regular rhythm.     Heart sounds: Normal heart sounds.  Pulmonary:     Effort: Pulmonary effort is normal.     Breath sounds: Normal breath sounds.  Abdominal:     General: There is no distension.     Palpations: Abdomen is soft. There is no mass.  Musculoskeletal:     Right lower leg: Edema present.     Left lower leg: Edema present.  Skin:    General: Skin is warm.  Neurological:     General: No focal deficit present.     Mental Status: He is alert and oriented to person, place, and time.  Psychiatric:        Mood and Affect: Mood normal.        Behavior: Behavior normal.    There is erythematous papular rash with small blisters on top.  This is mainly below the right scapula in a dermatomal distribution.  LABORATORY DATA:  I have reviewed the labs as listed.  CBC    Component Value Date/Time   WBC 4.7 08/27/2018 0936   RBC 2.27 (L) 08/27/2018 0936   HGB 7.6 (L) 08/27/2018 0936   HCT 23.9 (L) 08/27/2018 0936   PLT 258 08/27/2018 0936   MCV 105.3 (H) 08/27/2018 0936   MCH 33.5 08/27/2018 0936   MCHC 31.8 08/27/2018 0936   RDW 19.8 (H) 08/27/2018 0936   LYMPHSABS 0.9 08/27/2018 0936   MONOABS 1.0 08/27/2018 0936   EOSABS 0.1 08/27/2018 0936   BASOSABS 0.0 08/27/2018 0936   CMP Latest Ref Rng & Units 08/27/2018 08/20/2018 08/13/2018  Glucose 70 - 99 mg/dL 77 92 88  BUN 6 - 20 mg/dL 46(H) 48(H) 45(H)  Creatinine 0.61 - 1.24 mg/dL 4.39(H) 4.13(H) 4.33(H)  Sodium 135 - 145 mmol/L 139 143 141  Potassium 3.5 - 5.1 mmol/L 5.0 5.3(H) 4.8  Chloride 98 - 111 mmol/L 108 111 110  CO2 22 - 32 mmol/L 18(L) 20(L) 20(L)  Calcium 8.9 - 10.3 mg/dL 7.1(L) 7.0(L) 6.1(LL)  Total Protein 6.5 - 8.1 g/dL 6.6 6.5 6.5  Total Bilirubin 0.3 - 1.2 mg/dL 0.5 0.6 0.6  Alkaline Phos 38 - 126 U/L 56 52 46  AST 15 - 41 U/L _0 ALT 0 -  44 U/L _1 DIAGNOSTIC IMAGING:  I have independently reviewed the scans and discussed with the patient.   I have reviewed Venita Lick LPN's note and agree with the documentation.  I personally performed a face-to-face visit, made revisions and my assessment and plan is as follows.    ASSESSMENT & PLAN:   Kappa light chain myeloma ([place]) 1.  Kappa light chain myeloma: - Presentation with acute renal failure and hypercalcemia. -M spike of 0.4 g.  Serum immunofixation did not show any immunoglobulin heavy chain.  Beta-2 microglobulin of 32.8.  Kappa free light chains of 12,085 and free light chain ratio of 1981.  LDH of 224. - Started on weekly CyBorD on 06/17/2018. 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  Cytogenetic analysis shows complex chromosome aberrations.  Gain of 1 q. was seen.  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 minus; 17p- - We reviewed results of SPEP from 08/05/2018 which increased slightly to 0.3.  Previously it was 0.2 g. - Free kappa light chain are 9982.  Light chain ratio is 04/04/2003.  This has slightly gone up. - The changes in the free light chains could be related to underlying worsening of renal function.  However his kidney function improved today. - He follows up with Dr. Joelyn Oms of nephrology. - He will proceed with Velcade and Cytoxan today.  If he is M spike and light chains continue to get worse, we will discontinue cyclophosphamide and consider adding daratumumab. - Procrit 20,000 units weekly was started on 07/29/2018.  He is tolerating it well.   -Shingles have resolved with Valtrex  treatment.  - MM labs pending today. - Labs are acceptable to proceed with treatment today.  - RTC in 1 week.  If there is any further worsening of the labs, we will consider discontinuing Cytoxan and adding another agent.  2.  Hyperkalemia: -Potassium is 5.0 today. -Dr. Joelyn Oms has given him some samples of Veltassa.  -He was also told  to follow the list of potassium low foods given by Dr. Joelyn Oms.  3.  ID prophylaxis: -He did get a rash with acyclovir. -We will consider starting him on Valtrex prophylaxis.  4.  Low back pain: -He had rash and itching with oxycodone and hydrocodone. -This is well controlled with Dilaudid 4 mg every 8 hours.  5.  Lower extremity edema: -He has 2+ edema today.  He is taking torsemide 20 mg daily.  I have told him to increase it to 40 mg on days of worsening edema.      Total time spent is 25 minutes with more than 50% of the time spent face-to-face discussing treatment plan, side effects and coordination of care.    Orders placed this encounter:  No orders of the defined types were placed in this encounter.  Derek Jack, MD [place] [phone]

## 2018-08-27 NOTE — Assessment & Plan Note (Addendum)
1.  Kappa light chain myeloma: - Presentation with acute renal failure and hypercalcemia. -M spike of 0.4 g.  Serum immunofixation did not show any immunoglobulin heavy chain.  Beta-2 microglobulin of 32.8.  Kappa free light chains of 12,085 and free light chain ratio of 1981.  LDH of 224. - Started on weekly CyBorD on 06/17/2018. 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  Cytogenetic analysis shows complex chromosome aberrations.  Gain of 1 q. was seen.  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 minus; 17p- - We reviewed results of SPEP from 08/05/2018 which increased slightly to 0.3.  Previously it was 0.2 g. - Free kappa light chain are 9982.  Light chain ratio is 04/04/2003.  This has slightly gone up. - The changes in the free light chains could be related to underlying worsening of renal function.  However his kidney function improved today. - He follows up with Dr. Joelyn Oms of nephrology. - He will proceed with Velcade and Cytoxan today.  If he is M spike and light chains continue to get worse, we will discontinue cyclophosphamide and consider adding daratumumab. - Procrit 20,000 units weekly was started on 07/29/2018.  He is tolerating it well.   -Shingles have resolved with Valtrex  treatment.  - MM labs pending today. - Labs are acceptable to proceed with treatment today.  - RTC in 1 week.  If there is any further worsening of the labs, we will consider discontinuing Cytoxan and adding another agent.  2.  Hyperkalemia: -Potassium is 5.0 today. -Dr. Joelyn Oms has given him some samples of Veltassa.  -He was also told to follow the list of potassium low foods given by Dr. Joelyn Oms.  3.  ID prophylaxis: -He did get a rash with acyclovir. -We will consider starting him on Valtrex prophylaxis.  4.  Low back pain: -He had rash and itching with oxycodone and hydrocodone. -This is well controlled with Dilaudid 4 mg every 8 hours.  5.  Lower extremity edema: -He has 2+ edema  today.  He is taking torsemide 20 mg daily.  I have told him to increase it to 40 mg on days of worsening edema.

## 2018-08-30 LAB — KAPPA/LAMBDA LIGHT CHAINS
Kappa free light chain: 4599.5 mg/L — ABNORMAL HIGH (ref 3.3–19.4)
Kappa, lambda light chain ratio: 647.82 — ABNORMAL HIGH (ref 0.26–1.65)
Lambda free light chains: 7.1 mg/L (ref 5.7–26.3)

## 2018-08-31 LAB — PROTEIN ELECTROPHORESIS, SERUM
A/G Ratio: 1.9 — ABNORMAL HIGH (ref 0.7–1.7)
Albumin ELP: 3.9 g/dL (ref 2.9–4.4)
Alpha-1-Globulin: 0.1 g/dL (ref 0.0–0.4)
Alpha-2-Globulin: 0.8 g/dL (ref 0.4–1.0)
Beta Globulin: 0.8 g/dL (ref 0.7–1.3)
Gamma Globulin: 0.4 g/dL (ref 0.4–1.8)
Globulin, Total: 2.1 g/dL — ABNORMAL LOW (ref 2.2–3.9)
M-Spike, %: 0.2 g/dL — ABNORMAL HIGH
Total Protein ELP: 6 g/dL (ref 6.0–8.5)

## 2018-09-03 ENCOUNTER — Other Ambulatory Visit (HOSPITAL_COMMUNITY): Payer: Self-pay | Admitting: Hematology

## 2018-09-06 ENCOUNTER — Inpatient Hospital Stay (HOSPITAL_BASED_OUTPATIENT_CLINIC_OR_DEPARTMENT_OTHER): Payer: Medicare Other | Admitting: Hematology

## 2018-09-06 ENCOUNTER — Inpatient Hospital Stay (HOSPITAL_COMMUNITY): Payer: Medicare Other

## 2018-09-06 ENCOUNTER — Other Ambulatory Visit: Payer: Self-pay

## 2018-09-06 ENCOUNTER — Encounter (HOSPITAL_COMMUNITY): Payer: Self-pay | Admitting: Hematology

## 2018-09-06 ENCOUNTER — Inpatient Hospital Stay (HOSPITAL_COMMUNITY): Payer: Medicare Other | Attending: Hematology

## 2018-09-06 VITALS — BP 115/51 | HR 63

## 2018-09-06 VITALS — BP 125/86 | HR 75 | Temp 98.2°F | Resp 18 | Wt 165.8 lb

## 2018-09-06 DIAGNOSIS — Z5112 Encounter for antineoplastic immunotherapy: Secondary | ICD-10-CM | POA: Insufficient documentation

## 2018-09-06 DIAGNOSIS — E875 Hyperkalemia: Secondary | ICD-10-CM | POA: Insufficient documentation

## 2018-09-06 DIAGNOSIS — D631 Anemia in chronic kidney disease: Secondary | ICD-10-CM | POA: Insufficient documentation

## 2018-09-06 DIAGNOSIS — N185 Chronic kidney disease, stage 5: Secondary | ICD-10-CM

## 2018-09-06 DIAGNOSIS — C9000 Multiple myeloma not having achieved remission: Secondary | ICD-10-CM | POA: Diagnosis present

## 2018-09-06 DIAGNOSIS — N189 Chronic kidney disease, unspecified: Secondary | ICD-10-CM | POA: Diagnosis not present

## 2018-09-06 DIAGNOSIS — R600 Localized edema: Secondary | ICD-10-CM

## 2018-09-06 DIAGNOSIS — Z5111 Encounter for antineoplastic chemotherapy: Secondary | ICD-10-CM | POA: Diagnosis present

## 2018-09-06 LAB — CBC WITH DIFFERENTIAL/PLATELET
Abs Immature Granulocytes: 0.05 10*3/uL (ref 0.00–0.07)
Basophils Absolute: 0 10*3/uL (ref 0.0–0.1)
Basophils Relative: 1 %
Eosinophils Absolute: 0.1 10*3/uL (ref 0.0–0.5)
Eosinophils Relative: 4 %
HCT: 25.9 % — ABNORMAL LOW (ref 39.0–52.0)
Hemoglobin: 8.3 g/dL — ABNORMAL LOW (ref 13.0–17.0)
Immature Granulocytes: 1 %
Lymphocytes Relative: 22 %
Lymphs Abs: 0.8 10*3/uL (ref 0.7–4.0)
MCH: 33.6 pg (ref 26.0–34.0)
MCHC: 32 g/dL (ref 30.0–36.0)
MCV: 104.9 fL — ABNORMAL HIGH (ref 80.0–100.0)
Monocytes Absolute: 0.8 10*3/uL (ref 0.1–1.0)
Monocytes Relative: 23 %
Neutro Abs: 1.8 10*3/uL (ref 1.7–7.7)
Neutrophils Relative %: 49 %
Platelets: 258 10*3/uL (ref 150–400)
RBC: 2.47 MIL/uL — ABNORMAL LOW (ref 4.22–5.81)
RDW: 19.7 % — ABNORMAL HIGH (ref 11.5–15.5)
WBC: 3.5 10*3/uL — ABNORMAL LOW (ref 4.0–10.5)
nRBC: 0 % (ref 0.0–0.2)

## 2018-09-06 LAB — MAGNESIUM: Magnesium: 2.2 mg/dL (ref 1.7–2.4)

## 2018-09-06 LAB — COMPREHENSIVE METABOLIC PANEL
ALT: 10 U/L (ref 0–44)
AST: 14 U/L — ABNORMAL LOW (ref 15–41)
Albumin: 4 g/dL (ref 3.5–5.0)
Alkaline Phosphatase: 57 U/L (ref 38–126)
Anion gap: 12 (ref 5–15)
BUN: 44 mg/dL — ABNORMAL HIGH (ref 6–20)
CO2: 22 mmol/L (ref 22–32)
Calcium: 7.4 mg/dL — ABNORMAL LOW (ref 8.9–10.3)
Chloride: 106 mmol/L (ref 98–111)
Creatinine, Ser: 4.68 mg/dL — ABNORMAL HIGH (ref 0.61–1.24)
GFR calc Af Amer: 15 mL/min — ABNORMAL LOW (ref >60)
GFR calc non Af Amer: 13 mL/min — ABNORMAL LOW (ref >60)
Glucose, Bld: 102 mg/dL — ABNORMAL HIGH (ref 70–99)
Potassium: 5.6 mmol/L — ABNORMAL HIGH (ref 3.5–5.1)
Sodium: 140 mmol/L (ref 135–145)
Total Bilirubin: 0.4 mg/dL (ref 0.3–1.2)
Total Protein: 6.6 g/dL (ref 6.5–8.1)

## 2018-09-06 MED ORDER — INSULIN REGULAR HUMAN 100 UNIT/ML IJ SOLN
10.0000 [IU] | Freq: Once | INTRAMUSCULAR | Status: AC
  Administered 2018-09-06: 10 [IU] via INTRAVENOUS
  Filled 2018-09-06: qty 0.1

## 2018-09-06 MED ORDER — SODIUM CHLORIDE 0.9 % IV SOLN
Freq: Once | INTRAVENOUS | Status: AC
  Administered 2018-09-06: 10:00:00 via INTRAVENOUS

## 2018-09-06 MED ORDER — INSULIN REGULAR HUMAN 100 UNIT/ML IJ SOLN
10.0000 [IU] | Freq: Once | INTRAMUSCULAR | Status: DC
  Filled 2018-09-06: qty 10

## 2018-09-06 MED ORDER — INSULIN REGULAR HUMAN 100 UNIT/ML IJ SOLN: 10.0000 [IU] | Freq: Once | INTRAMUSCULAR | Status: DC

## 2018-09-06 MED ORDER — BORTEZOMIB CHEMO SQ INJECTION 3.5 MG (2.5MG/ML)
1.3000 mg/m2 | Freq: Once | INTRAMUSCULAR | Status: AC
  Administered 2018-09-06: 2.5 mg via SUBCUTANEOUS
  Filled 2018-09-06: qty 1

## 2018-09-06 MED ORDER — VALACYCLOVIR HCL 500 MG PO TABS: 500.0000 mg | ORAL_TABLET | Freq: Two times a day (BID) | ORAL | 6 refills | Status: DC

## 2018-09-06 MED ORDER — EPOETIN ALFA-EPBX 10000 UNIT/ML IJ SOLN
20000.0000 [IU] | Freq: Once | INTRAMUSCULAR | Status: AC
  Administered 2018-09-06: 20000 [IU] via SUBCUTANEOUS
  Filled 2018-09-06: qty 2

## 2018-09-06 MED ORDER — PALONOSETRON HCL INJECTION 0.25 MG/5ML
0.2500 mg | Freq: Once | INTRAVENOUS | Status: AC
  Administered 2018-09-06: 0.25 mg via INTRAVENOUS
  Filled 2018-09-06: qty 5

## 2018-09-06 MED ORDER — DEXTROSE 50 % IV SOLN
50.0000 mL | Freq: Once | INTRAVENOUS | Status: AC
  Administered 2018-09-06: 13:00:00 50 mL via INTRAVENOUS
  Filled 2018-09-06: qty 50

## 2018-09-06 MED ORDER — SODIUM CHLORIDE 0.9 % IV SOLN
300.0000 mg/m2 | Freq: Once | INTRAVENOUS | Status: AC
  Administered 2018-09-06: 600 mg via INTRAVENOUS
  Filled 2018-09-06: qty 30

## 2018-09-06 MED ORDER — SODIUM CHLORIDE 0.9 % IV SOLN
20.0000 mg | Freq: Once | INTRAVENOUS | Status: AC
  Administered 2018-09-06: 20 mg via INTRAVENOUS
  Filled 2018-09-06: qty 20

## 2018-09-06 NOTE — Assessment & Plan Note (Addendum)
1.  Kappa light chain myeloma: - Presentation with acute renal failure and hypercalcemia. -M spike of 0.4 g.  Serum immunofixation did not show any immunoglobulin heavy chain.  Beta-2 microglobulin of 32.8.  Kappa free light chains of 12,085 and free light chain ratio of 1981.  LDH of 224. - Started on weekly CyBorD on 06/17/2018. 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  Cytogenetic analysis shows complex chromosome aberrations.  Gain of 1 q. was seen.  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 Joelyn Oms of nephrology. - Retacrit 20,000 units weekly was started on 07/22/2018. -M spike on 08/27/2018 is 0.2 g/dL.  Kappa free light chains have decreased to 4599 from 9982 previously.  Light chain ratio has also improved to 647 from 1405.  This showed a very good response to treatment. - Week 11 of cyclophosphamide and Velcade was on 08/27/2018. - We reviewed his labs.  He may proceed with his weekly treatment today without any dose modifications.  We will reevaluate him in 1 week.  2.  Hyperkalemia: -Potassium is 5.6 today. -Dr. Joelyn Oms has given him some samples of Veltassa.  However patient is not taking them. -I have recommended him to start taking them.  Today we will give some insulin and D50.  3.  ID prophylaxis: -He had a rash with acyclovir.  We will start him on Valtrex 500 twice daily.   4.  Low back pain: -He had rash and itching with oxycodone and hydrocodone. -He is taking Dilaudid 4 mg every 8 hours, controlling well.  5.  Lower extremity edema: -He is taking torsemide 20 mg - 40 mg daily depending on the swelling.

## 2018-09-06 NOTE — Progress Notes (Signed)
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[place])   06/16/2018 Initial Diagnosis    Kappa light chain myeloma ([place])    06/17/2018 -  Chemotherapy    The patient had palonosetron (ALOXI) injection 0.25 mg, 0.25 mg, Intravenous,  Once, 12 of 16 cycles Administration: 0.25 mg (06/17/2018), 0.25 mg (06/23/2018), 0.25 mg (06/30/2018), 0.25 mg (07/07/2018), 0.25 mg (07/14/2018), 0.25 mg (07/22/2018), 0.25 mg (07/29/2018), 0.25 mg (08/06/2018), 0.25 mg (08/13/2018), 0.25 mg (08/20/2018), 0.25 mg (08/27/2018), 0.25 mg (09/06/2018) bortezomib SQ (VELCADE) chemo injection 2.5 mg, 1.3 mg/m2 = 2.5 mg, Subcutaneous,  Once, 12 of 16 cycles Administration: 2.5 mg (06/17/2018), 2.5 mg (06/23/2018), 2.5 mg (06/30/2018), 2.5 mg (07/07/2018), 2.5 mg (07/14/2018), 2.5 mg (07/22/2018), 2.5 mg (07/29/2018), 2.5 mg (08/06/2018), 2.5 mg (08/13/2018), 2.5 mg (08/20/2018), 2.5 mg (08/27/2018) cyclophosphamide (CYTOXAN) 300 mg in sodium chloride 0.9 % 250 mL chemo infusion, 300 mg (100 % of original dose 300 mg), Intravenous,  Once, 12 of 16 cycles Dose modification: 300 mg (original dose 300 mg, Cycle 1, Reason: Change in SCr/CrCl), 500 mg (original dose 500 mg, Cycle 2, Reason: Provider Judgment), 500 mg (original dose 500 mg, Cycle 3, Reason: Change in SCr/CrCl) Administration: 300 mg (06/17/2018), 500 mg (06/23/2018), 500 mg (06/30/2018), 600 mg (07/07/2018), 600 mg (07/14/2018), 600 mg (07/22/2018), 600 mg (07/29/2018), 600 mg (08/06/2018), 600 mg (08/13/2018), 600 mg (08/20/2018), 600 mg (08/27/2018)  for chemotherapy treatment.       CANCER STAGING: Cancer Staging No matching staging information was found for the patient.   INTERVAL HISTORY:  Mr. Prehn 58 y.o. male for follow-up of  multiple myeloma.  He is not compliant with potassium rich foods.  He is taking 2 tablets of torsemide.  Pain is well controlled with Dilaudid every 8 hours.  Ankle swelling is stable.  This morning he noticed numbness in the right foot which lasted only a few minutes.  Denies any fevers or infections.  Denies any ER visits or hospitalizations.  Denies nausea vomiting or diarrhea.  Appetite and energy levels are 75%.     REVIEW OF SYSTEMS:  Review of Systems  Cardiovascular: Positive for leg swelling.  Neurological: Positive for numbness.  All other systems reviewed and are negative.    PAST MEDICAL/SURGICAL HISTORY:  Past Medical History:  Diagnosis Date  . Allergy   . Arthritis    neck and back  . Blood transfusion without reported diagnosis   . BPH (benign prostatic hyperplasia)   . Cancer [facility]) 2004   testicle  . Chronic kidney disease    kidney stones  . Left lumbar radiculopathy 06/12/2016  . 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 Medical history non-contributory    Pt has scattered thoughts and uncertain of past medical history  . 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 Stones, urinary tract   . Substance abuse ([place])    prescribed oxydcodone- 10 years   Past Surgical History:  Procedure Laterality Date  . BACK SURGERY     x5  . CERVICAL DISC SURGERY     x2  . COLONOSCOPY WITH PROPOFOL N/A 08/11/2016   Dr. Gala Romney: non-bleeding internal hemorrhoids, one 4 mm hyperplastic rectal polyp, diverticulosis in entire examined colon  . POLYPECTOMY  08/11/2016  Procedure: POLYPECTOMY;  Surgeon: [name], MD;  Location: [facility];  Service: Endoscopy;;  colon  . testicular cancer  2004  . TONSILLECTOMY       SOCIAL HISTORY:  Social History   Socioeconomic History  . Marital status: Single    Spouse name: Not on file  . Number of children: 1  . Years of education: 43  . Highest education level: Not on file  Occupational History  . Occupation: retired    Comment:  Psychologist, prison and probation services  . Occupation: disabled  Social Needs  . Financial resource strain: Not on file  . Food insecurity:    Worry: Not on file    Inability: Not on file  . Transportation needs:    Medical: Not on file    Non-medical: Not on file  Tobacco Use  . Smoking status: Current Every Day Smoker    Packs/day: 1.00    Years: 40.00    Pack years: 40.00    Types: Cigarettes    Start date: 03/31/1974  . Smokeless tobacco: Never Used  Substance and Sexual Activity  . Alcohol use: Yes    Comment: Occasional  . Drug use: No    Types: Cocaine    Comment: Remote hx of cocaine, quit 1989  . Sexual activity: Yes  Lifestyle  . Physical activity:    Days per week: Not on file    Minutes per session: Not on file  . Stress: Not on file  Relationships  . Social connections:    Talks on phone: Not on file    Gets together: Not on file    Attends religious service: Not on file    Active member of club or organization: Not on file    Attends meetings of clubs or organizations: Not on file    Relationship status: Not on file  . Intimate partner violence:    Fear of current or ex partner: Not on file    Emotionally abused: Not on file    Physically abused: Not on file    Forced sexual activity: Not on file  Other Topics Concern  . Not on file  Social History Narrative   [employer] for 12 years   [employer] for 52 years      Never married   One daughter      Lives alone   Retail banker   Right-handed   Occasional caffeine use          FAMILY HISTORY:  Family History  Problem Relation Age of Onset  . COPD Mother   . Heart disease Mother   . Other Father        Never knew his father.  . Thyroid disease Sister   . Arthritis Sister   . Heart disease Sister        bypass  . Colon cancer Neg Hx     CURRENT MEDICATIONS:  Outpatient Encounter Medications as of 09/06/2018  Medication Sig  . acyclovir (ZOVIRAX) 200 MG capsule Take 200 mg by mouth daily.   .  bortezomib IV (VELCADE) 3.5 MG injection Inject 1.3 mg/m2 into the vein once a week.   . calcitRIOL (ROCALTROL) 0.25 MCG capsule Take 1 capsule by mouth daily.  . CYCLOPHOSPHAMIDE IV Inject into the vein once a week.  . diphenhydrAMINE (BENADRYL) 25 MG tablet Take 1 tablet (25 mg total) by mouth as needed for itching.  Marland Kitchen HYDROmorphone (DILAUDID) 4 MG tablet Take 1 tablet (4 mg  total) by mouth every 8 (eight) hours as needed for severe pain.  Marland Kitchen lactulose (CHRONULAC) 10 GM/15ML solution Take 10 g by mouth as needed for mild constipation.  . Misc. Devices MISC Please provide patient with rollaider.  Dx: multiple myeloma C90.0  . protein supplement (RESOURCE BENEPROTEIN) POWD Take 1 Scoop by mouth 3 (three) times daily with meals.  . senna (SENOKOT) 8.6 MG tablet Take 2 tablets by mouth 2 (two) times daily.  . sodium bicarbonate 650 MG tablet Take 1,300 mg by mouth 2 (two) times daily.  Marland Kitchen torsemide (DEMADEX) 20 MG tablet Pt taking differently-taking 1 tablet daily, and 2 tablets only if his ankles are really swollen  . valACYclovir (VALTREX) 1000 MG tablet Take 1 tablet (1,000 mg total) by mouth daily. (Patient not taking: Reported on 08/27/2018)  . valACYclovir (VALTREX) 500 MG tablet Take 1 tablet (500 mg total) by mouth 2 (two) times daily.   No facility-administered encounter medications on file as of 09/06/2018.     ALLERGIES:  Allergies  Allergen Reactions  . Acetaminophen Nausea Only and Other (See Comments)    Elevated liver enzymes  . Cymbalta [Duloxetine Hcl] Swelling    Facial swelling  . Diclofenac Sodium Swelling       . Imodium [Loperamide] Swelling    facial  . Lyrica [Pregabalin] Swelling  . Morphine Other (See Comments)    Bradycardia   . Vioxx [Rofecoxib] Swelling  . Aleve [Naproxen] Swelling    eye     PHYSICAL EXAM:  ECOG Performance status: 1  Vitals:   09/06/18 0904  BP: 125/86  Pulse: 75  Resp: 18  Temp: 98.2 F (36.8 C)  SpO2: 100%   Filed Weights    09/06/18 0904  Weight: 165 lb 12.8 oz (75.2 kg)    Physical Exam Vitals signs reviewed.  Constitutional:      Appearance: Normal appearance.  Cardiovascular:     Rate and Rhythm: Normal rate and regular rhythm.     Heart sounds: Normal heart sounds.  Pulmonary:     Effort: Pulmonary effort is normal.     Breath sounds: Normal breath sounds.  Abdominal:     General: There is no distension.     Palpations: Abdomen is soft. There is no mass.  Musculoskeletal:     Right lower leg: Edema present.     Left lower leg: Edema present.  Skin:    General: Skin is warm.  Neurological:     General: No focal deficit present.     Mental Status: He is alert and oriented to person, place, and time.  Psychiatric:        Mood and Affect: Mood normal.        Behavior: Behavior normal.      LABORATORY DATA:  I have reviewed the labs as listed.  CBC    Component Value Date/Time   WBC 3.5 (L) 09/06/2018 0838   RBC 2.47 (L) 09/06/2018 0838   HGB 8.3 (L) 09/06/2018 0838   HCT 25.9 (L) 09/06/2018 0838   PLT 258 09/06/2018 0838   MCV 104.9 (H) 09/06/2018 0838   MCH 33.6 09/06/2018 0838   MCHC 32.0 09/06/2018 0838   RDW 19.7 (H) 09/06/2018 0838   LYMPHSABS 0.8 09/06/2018 0838   MONOABS 0.8 09/06/2018 0838   EOSABS 0.1 09/06/2018 0838   BASOSABS 0.0 09/06/2018 0838   CMP Latest Ref Rng & Units 09/06/2018 08/27/2018 08/20/2018  Glucose 70 - 99 mg/dL 102(H) 77 92  BUN 6 -  20 mg/dL 44(H) 46(H) 48(H)  Creatinine 0.61 - 1.24 mg/dL 4.68(H) 4.39(H) 4.13(H)  Sodium 135 - 145 mmol/L 140 139 143  Potassium 3.5 - 5.1 mmol/L 5.6(H) 5.0 5.3(H)  Chloride 98 - 111 mmol/L 106 108 111  CO2 22 - 32 mmol/L 22 18(L) 20(L)  Calcium 8.9 - 10.3 mg/dL 7.4(L) 7.1(L) 7.0(L)  Total Protein 6.5 - 8.1 g/dL 6.6 6.6 6.5  Total Bilirubin 0.3 - 1.2 mg/dL 0.4 0.5 0.6  Alkaline Phos 38 - 126 U/L 57 56 52  AST 15 - 41 U/L 14(L) 15 15  ALT 0 - 44 U/L _0 DIAGNOSTIC IMAGING:  I have independently  reviewed the scans and discussed with the patient.   I have reviewed Venita Lick LPN's note and agree with the documentation.  I personally performed a face-to-face visit, made revisions and my assessment and plan is as follows.    ASSESSMENT & PLAN:   Kappa light chain myeloma ([place]) 1.  Kappa light chain myeloma: - Presentation with acute renal failure and hypercalcemia. -M spike of 0.4 g.  Serum immunofixation did not show any immunoglobulin heavy chain.  Beta-2 microglobulin of 32.8.  Kappa free light chains of 12,085 and free light chain ratio of 1981.  LDH of 224. - Started on weekly CyBorD on 06/17/2018. 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  Cytogenetic analysis shows complex chromosome aberrations.  Gain of 1 q. was seen.  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 Joelyn Oms of nephrology. - Retacrit 20,000 units weekly was started on 07/22/2018. -M spike on 08/27/2018 is 0.2 g/dL.  Kappa free light chains have decreased to 4599 from 9982 previously.  Light chain ratio has also improved to 647 from 1405.  This showed a very good response to treatment. - Week 11 of cyclophosphamide and Velcade was on 08/27/2018. - We reviewed his labs.  He may proceed with his weekly treatment today without any dose modifications.  We will reevaluate him in 1 week.  2.  Hyperkalemia: -Potassium is 5.6 today. -Dr. Joelyn Oms has given him some samples of Veltassa.  However patient is not taking them. -I have recommended him to start taking them.  Today we will give some insulin and D50.  3.  ID prophylaxis: -He had a rash with acyclovir.  We will start him on Valtrex 500 twice daily.   4.  Low back pain: -He had rash and itching with oxycodone and hydrocodone. -He is taking Dilaudid 4 mg every 8 hours, controlling well.  5.  Lower extremity edema: -He is taking torsemide 20 mg - 40 mg daily depending on the swelling.        Total time  spent is 25 minutes with more than 50% of the time spent face-to-face discussing treatment plan and coordination of care.    Orders placed this encounter:  Orders Placed This Encounter  Procedures  . CBC with Differential/Platelet  . Comprehensive metabolic panel  . Magnesium  . Protein electrophoresis, serum  . Kappa/lambda light chains  . Lactate dehydrogenase      Derek Jack, MD [place] [phone]

## 2018-09-06 NOTE — Patient Instructions (Signed)
[facility] Discharge Instructions for Patients Receiving Chemotherapy   Beginning January 23rd 2017 lab work for the [facility] will be done in the  Main lab at [facility] on 1st floor. If you have a lab appointment with the [facility] please come in thru the  Main Entrance and check in at the main information desk   Today you received the following chemotherapy agents Velcade and Cytoxan  To help prevent nausea and vomiting after your treatment, we encourage you to take your nausea medication   If you develop nausea and vomiting, or diarrhea that is not controlled by your medication, call the clinic.  The clinic phone number is [phone]. Office hours are Monday-Friday 8:30am-5:00pm.  BELOW ARE SYMPTOMS THAT SHOULD BE REPORTED IMMEDIATELY:  *FEVER GREATER THAN 101.0 F  *CHILLS WITH OR WITHOUT FEVER  NAUSEA AND VOMITING THAT IS NOT CONTROLLED WITH YOUR NAUSEA MEDICATION  *UNUSUAL SHORTNESS OF BREATH  *UNUSUAL BRUISING OR BLEEDING  TENDERNESS IN MOUTH AND THROAT WITH OR WITHOUT PRESENCE OF ULCERS  *URINARY PROBLEMS  *BOWEL PROBLEMS  UNUSUAL RASH Items with * indicate a potential emergency and should be followed up as soon as possible. If you have an emergency after office hours please contact your primary care physician or go to the nearest emergency department.  Please call the clinic during office hours if you have any questions or concerns.   You may also contact the Patient Navigator at [phone] should you have any questions or need assistance in obtaining follow up care.      Resources For Cancer Patients and their Caregivers ? American Cancer Society: Can assist with transportation, wigs, general needs, runs Look Good Feel Better.        380-828-7482 ? Cancer Care: Provides financial assistance, online support groups, medication/co-pay assistance.  1-800-813-HOPE 563-043-7553) ? [place] Assists  [place] cancer patients and their families through emotional , educational and financial support.  (434) 122-0412 ? [place] DSS Where to apply for food stamps, Medicaid and utility assistance. 6093932679 ? [facility]: Transportation to medical appointments. (571)686-8896 ? Social Security Administration: May apply for disability if have a Stage IV cancer. 435 534 1602 905-784-0228 ? [employer], Disability and Transit Services: Assists with nutrition, care and transit needs. 430-361-2979

## 2018-09-06 NOTE — Patient Instructions (Signed)
[facility] at [facility] Discharge Instructions  You were seen today by Dr. Katragadda. He went over your recent lab results. He will see you back in 1 week for labs and follow up.   Thank you for choosing  Cancer Center at [facility] to provide your oncology and hematology care.  To afford each patient quality time with our provider, please arrive at least 15 minutes before your scheduled appointment time.   If you have a lab appointment with the Cancer Center please come in thru the  Main Entrance and check in at the main information desk  You need to re-schedule your appointment should you arrive 10 or more minutes late.  We strive to give you quality time with our providers, and arriving late affects you and other patients whose appointments are after yours.  Also, if you no show three or more times for appointments you may be dismissed from the clinic at the providers discretion.     Again, thank you for choosing [facility].  Our hope is that these requests will decrease the amount of time that you wait before being seen by our physicians.       _____________________________________________________________  Should you have questions after your visit to  Cancer Center, please contact our office at [phone] between the hours of 8:00 a.m. and 4:30 p.m.  Voicemails left after 4:00 p.m. will not be returned until the following business day.  For prescription refill requests, have your pharmacy contact our office and allow 72 hours.    Cancer Center Support Programs:   > Cancer Support Group  2nd Tuesday of the month 1pm-2pm, Journey Room    

## 2018-09-06 NOTE — Progress Notes (Signed)
[id number] Patient seen by Dr. Delton Coombes and lab work reviewed, including K 5.6 and Cr 4.68. Per MD, ok to proceed with Velcade and Cytoxan today.   [name] tolerated treatment without incident or complaint. Discharged in satisfactory condition.

## 2018-09-06 NOTE — Progress Notes (Signed)
09/06/18  K+ = 5.6  Give Novolin R Insulin 10 units Intravenously followed with Dextrose 50% 50 ml x 1 on both.  OK to treat with today's lab values - creatinine  T.O. Dr Beckey Downing LPN/Beauford Lando Ronnald Ramp, PharmD

## 2018-09-07 ENCOUNTER — Telehealth (HOSPITAL_COMMUNITY): Payer: Self-pay | Admitting: *Deleted

## 2018-09-07 DIAGNOSIS — C9000 Multiple myeloma not having achieved remission: Secondary | ICD-10-CM

## 2018-09-07 NOTE — Telephone Encounter (Signed)
-----   Message from Derek Jack, MD sent at 09/07/2018  3:39 PM EDT ----- Regarding: FW: Vetlassa and Phosphorus Please make sure we check magnesium and phosphate next week and monthly. ----- Message ----- From: Rexene Agent, MD Sent: 09/07/2018   2:03 PM EDT To: Derek Jack, MD Subject: Minerva Areola and Phosphorus                        Good afternoon Pt starting on daily Veltassa, would you mind checking a monthly magnesium with your standing labs?   Also, would you also check a monthly phosphorus for his CKD BMD? Thanks RS

## 2018-09-09 ENCOUNTER — Ambulatory Visit: Payer: Medicare Other | Admitting: General Surgery

## 2018-09-09 ENCOUNTER — Encounter: Payer: Self-pay | Admitting: General Surgery

## 2018-09-09 ENCOUNTER — Other Ambulatory Visit: Payer: Self-pay

## 2018-09-09 VITALS — BP 129/73 | HR 87 | Temp 98.7°F | Resp 16 | Ht 72.0 in | Wt 164.0 lb

## 2018-09-09 DIAGNOSIS — C9000 Multiple myeloma not having achieved remission: Secondary | ICD-10-CM | POA: Diagnosis not present

## 2018-09-09 NOTE — Patient Instructions (Signed)
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
## 2018-09-10 ENCOUNTER — Other Ambulatory Visit: Payer: Self-pay
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
## 2018-09-13 ENCOUNTER — Inpatient Hospital Stay (HOSPITAL_COMMUNITY): Payer: Medicare Other

## 2018-09-13 ENCOUNTER — Other Ambulatory Visit: Payer: Self-pay

## 2018-09-13 ENCOUNTER — Inpatient Hospital Stay (HOSPITAL_BASED_OUTPATIENT_CLINIC_OR_DEPARTMENT_OTHER): Payer: Medicare Other | Admitting: Hematology

## 2018-09-13 VITALS — BP 137/67 | HR 88 | Temp 98.0°F | Resp 18

## 2018-09-13 VITALS — BP 118/98 | HR 93 | Temp 98.3°F | Resp 18 | Wt 163.9 lb

## 2018-09-13 DIAGNOSIS — C9000 Multiple myeloma not having achieved remission: Secondary | ICD-10-CM

## 2018-09-13 DIAGNOSIS — D631 Anemia in chronic kidney disease: Secondary | ICD-10-CM

## 2018-09-13 DIAGNOSIS — N185 Chronic kidney disease, stage 5: Secondary | ICD-10-CM

## 2018-09-13 DIAGNOSIS — E875 Hyperkalemia: Secondary | ICD-10-CM

## 2018-09-13 DIAGNOSIS — R600 Localized edema: Secondary | ICD-10-CM

## 2018-09-13 DIAGNOSIS — N189 Chronic kidney disease, unspecified: Secondary | ICD-10-CM | POA: Diagnosis not present

## 2018-09-13 DIAGNOSIS — Z5112 Encounter for antineoplastic immunotherapy: Secondary | ICD-10-CM | POA: Diagnosis not present

## 2018-09-13 LAB — COMPREHENSIVE METABOLIC PANEL
ALT: 11 U/L (ref 0–44)
AST: 14 U/L — ABNORMAL LOW (ref 15–41)
Albumin: 4.2 g/dL (ref 3.5–5.0)
Alkaline Phosphatase: 59 U/L (ref 38–126)
Anion gap: 11 (ref 5–15)
BUN: 46 mg/dL — ABNORMAL HIGH (ref 6–20)
CO2: 21 mmol/L — ABNORMAL LOW (ref 22–32)
Calcium: 8.2 mg/dL — ABNORMAL LOW (ref 8.9–10.3)
Chloride: 106 mmol/L (ref 98–111)
Creatinine, Ser: 4.64 mg/dL — ABNORMAL HIGH (ref 0.61–1.24)
GFR calc Af Amer: 15 mL/min — ABNORMAL LOW (ref >60)
GFR calc non Af Amer: 13 mL/min — ABNORMAL LOW (ref >60)
Glucose, Bld: 90 mg/dL (ref 70–99)
Potassium: 4.5 mmol/L (ref 3.5–5.1)
Sodium: 138 mmol/L (ref 135–145)
Total Bilirubin: 0.4 mg/dL (ref 0.3–1.2)
Total Protein: 7.1 g/dL (ref 6.5–8.1)

## 2018-09-13 LAB — CBC WITH DIFFERENTIAL/PLATELET
Abs Immature Granulocytes: 0.03 10*3/uL (ref 0.00–0.07)
Basophils Absolute: 0 10*3/uL (ref 0.0–0.1)
Basophils Relative: 1 %
Eosinophils Absolute: 0.2 10*3/uL (ref 0.0–0.5)
Eosinophils Relative: 6 %
HCT: 27.8 % — ABNORMAL LOW (ref 39.0–52.0)
Hemoglobin: 8.6 g/dL — ABNORMAL LOW (ref 13.0–17.0)
Immature Granulocytes: 1 %
Lymphocytes Relative: 24 %
Lymphs Abs: 0.8 10*3/uL (ref 0.7–4.0)
MCH: 32.8 pg (ref 26.0–34.0)
MCHC: 30.9 g/dL (ref 30.0–36.0)
MCV: 106.1 fL — ABNORMAL HIGH (ref 80.0–100.0)
Monocytes Absolute: 1 10*3/uL (ref 0.1–1.0)
Monocytes Relative: 33 %
Neutro Abs: 1.1 10*3/uL — ABNORMAL LOW (ref 1.7–7.7)
Neutrophils Relative %: 35 %
Platelets: 249 10*3/uL (ref 150–400)
RBC: 2.62 MIL/uL — ABNORMAL LOW (ref 4.22–5.81)
RDW: 19 % — ABNORMAL HIGH (ref 11.5–15.5)
WBC: 3.1 10*3/uL — ABNORMAL LOW (ref 4.0–10.5)
nRBC: 0 % (ref 0.0–0.2)

## 2018-09-13 LAB — PHOSPHORUS: Phosphorus: 6.4 mg/dL — ABNORMAL HIGH (ref 2.5–4.6)

## 2018-09-13 LAB — MAGNESIUM: Magnesium: 1.9 mg/dL (ref 1.7–2.4)

## 2018-09-13 MED ORDER — PALONOSETRON HCL INJECTION 0.25 MG/5ML
0.2500 mg | Freq: Once | INTRAVENOUS | Status: AC
  Administered 2018-09-13: 0.25 mg via INTRAVENOUS
  Filled 2018-09-13: qty 5

## 2018-09-13 MED ORDER — EPOETIN ALFA-EPBX 10000 UNIT/ML IJ SOLN
20000.0000 [IU] | Freq: Once | INTRAMUSCULAR | Status: AC
  Administered 2018-09-13: 20000 [IU] via SUBCUTANEOUS
  Filled 2018-09-13: qty 2

## 2018-09-13 MED ORDER — SODIUM CHLORIDE 0.9 % IV SOLN
Freq: Once | INTRAVENOUS | Status: AC
  Administered 2018-09-13: 11:00:00 via INTRAVENOUS

## 2018-09-13 MED ORDER — SODIUM CHLORIDE 0.9 % IV SOLN
20.0000 mg | Freq: Once | INTRAVENOUS | Status: AC
  Administered 2018-09-13: 20 mg via INTRAVENOUS
  Filled 2018-09-13: qty 20

## 2018-09-13 MED ORDER — SODIUM CHLORIDE 0.9% FLUSH
10.0000 mL | INTRAVENOUS | Status: DC | PRN
  Administered 2018-09-13: 10 mL
  Filled 2018-09-13: qty 10

## 2018-09-13 MED ORDER — BORTEZOMIB CHEMO SQ INJECTION 3.5 MG (2.5MG/ML)
1.3000 mg/m2 | Freq: Once | INTRAMUSCULAR | Status: AC
  Administered 2018-09-13: 2.5 mg via SUBCUTANEOUS
  Filled 2018-09-13: qty 1

## 2018-09-13 MED ORDER — SODIUM CHLORIDE 0.9 % IV SOLN
300.0000 mg/m2 | Freq: Once | INTRAVENOUS | Status: AC
  Administered 2018-09-13: 600 mg via INTRAVENOUS
  Filled 2018-09-13: qty 30

## 2018-09-13 NOTE — Progress Notes (Signed)
[name], [place] [address]   CLINIC:  Medical Oncology/Hematology  PCP:  Brandon Fire, MD [place] [address] [phone]   REASON FOR VISIT:  Follow-up for Multiple Myeloma   CURRENT THERAPY: CyBorD  BRIEF ONCOLOGIC HISTORY:  Oncology History  Kappa light chain myeloma ([place])  06/16/2018 Initial Diagnosis   Kappa light chain myeloma ([place])   06/17/2018 -  Chemotherapy   The patient had palonosetron (ALOXI) injection 0.25 mg, 0.25 mg, Intravenous,  Once, 13 of 16 cycles Administration: 0.25 mg (06/17/2018), 0.25 mg (06/23/2018), 0.25 mg (06/30/2018), 0.25 mg (07/07/2018), 0.25 mg (07/14/2018), 0.25 mg (07/22/2018), 0.25 mg (07/29/2018), 0.25 mg (08/06/2018), 0.25 mg (08/13/2018), 0.25 mg (08/20/2018), 0.25 mg (08/27/2018), 0.25 mg (09/06/2018), 0.25 mg (09/13/2018) bortezomib SQ (VELCADE) chemo injection 2.5 mg, 1.3 mg/m2 = 2.5 mg, Subcutaneous,  Once, 13 of 16 cycles Administration: 2.5 mg (06/17/2018), 2.5 mg (06/23/2018), 2.5 mg (06/30/2018), 2.5 mg (07/07/2018), 2.5 mg (07/14/2018), 2.5 mg (07/22/2018), 2.5 mg (07/29/2018), 2.5 mg (08/06/2018), 2.5 mg (08/13/2018), 2.5 mg (08/20/2018), 2.5 mg (08/27/2018), 2.5 mg (09/06/2018), 2.5 mg (09/13/2018) cyclophosphamide (CYTOXAN) 300 mg in sodium chloride 0.9 % 250 mL chemo infusion, 300 mg (100 % of original dose 300 mg), Intravenous,  Once, 13 of 16 cycles Dose modification: 300 mg (original dose 300 mg, Cycle 1, Reason: Change in SCr/CrCl), 500 mg (original dose 500 mg, Cycle 2, Reason: Provider Judgment), 500 mg (original dose 500 mg, Cycle 3, Reason: Change in SCr/CrCl) Administration: 300 mg (06/17/2018), 500 mg (06/23/2018), 500 mg (06/30/2018), 600 mg (07/07/2018), 600 mg (07/14/2018), 600 mg (07/22/2018), 600 mg (07/29/2018), 600 mg (08/06/2018), 600 mg (08/13/2018), 600 mg (08/20/2018), 600 mg (08/27/2018), 600 mg (09/06/2018), 600 mg (09/13/2018)  for chemotherapy treatment.        INTERVAL HISTORY:  [name] 58 y.o. male presents today for follow up. Reports overall doing well. Denies nay significant fatigue.  Appetite is stable.  He states he is now taking Veltessa as prescribed.  He is also avoiding potassium rich foods.  He noticed increase bilateral lower extremity swelling after increased salt intake.  He states he is trying to monitor his salt intake.  He reports his bowel movements are normal.  Denies any constipation.  Pain is well controlled with current pain regimen.  He denies any peripheral neuropathies.  He states he is ready to proceed with treatment today.   REVIEW OF SYSTEMS:  Review of Systems  Constitutional: Positive for fatigue.  HENT:  Negative.   Eyes: Negative.   Respiratory: Negative.   Cardiovascular: Negative.   Gastrointestinal: Negative.   Endocrine: Negative.   Genitourinary: Negative.    Musculoskeletal: Positive for myalgias.  Skin: Negative.   Neurological: Negative.   Hematological: Negative.   Psychiatric/Behavioral: Negative.      PAST MEDICAL/SURGICAL HISTORY:  Past Medical History:  Diagnosis Date  . Allergy   . Arthritis    neck and back  . Blood transfusion without reported diagnosis   . BPH (benign prostatic hyperplasia)   . Cancer [facility]) 2004   testicle  . Chronic kidney disease    kidney stones  . Left lumbar radiculopathy 06/12/2016  . 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 Medical history non-contributory    Pt has scattered thoughts and uncertain of past medical history  . 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 Stones, urinary tract   . Substance abuse ([place])    prescribed oxydcodone- 10 years   Past Surgical History:  Procedure Laterality Date  . BACK SURGERY     x5  . CERVICAL DISC SURGERY     x2  . COLONOSCOPY WITH PROPOFOL N/A 08/11/2016   Dr. Gala Rowland: non-bleeding internal hemorrhoids, one 4 mm hyperplastic rectal polyp, diverticulosis in entire examined colon  . POLYPECTOMY  08/11/2016   Procedure: POLYPECTOMY;  Surgeon:  Brandon Dolin, MD;  Location: [facility];  Service: Endoscopy;;  colon  . testicular cancer  2004  . TONSILLECTOMY       SOCIAL HISTORY:  Social History   Socioeconomic History  . Marital status: Single    Spouse name: Not on file  . Number of children: 1  . Years of education: 69  . Highest education level: Not on file  Occupational History  . Occupation: retired    Comment: Psychologist, prison and probation services  . Occupation: disabled  Social Needs  . Financial resource strain: Not on file  . Food insecurity    Worry: Not on file    Inability: Not on file  . Transportation needs    Medical: Not on file    Non-medical: Not on file  Tobacco Use  . Smoking status: Current Every Day Smoker    Packs/day: 1.00    Years: 40.00    Pack years: 40.00    Types: Cigarettes    Start date: 03/31/1974  . Smokeless tobacco: Never Used  Substance and Sexual Activity  . Alcohol use: Yes    Comment: Occasional  . Drug use: No    Types: Cocaine    Comment: Remote hx of cocaine, quit 1989  . Sexual activity: Yes  Lifestyle  . Physical activity    Days per week: Not on file    Minutes per session: Not on file  . Stress: Not on file  Relationships  . Social Herbalist on phone: Not on file    Gets together: Not on file    Attends religious service: Not on file    Active member of club or organization: Not on file    Attends meetings of clubs or organizations: Not on file    Relationship status: Not on file  . Intimate partner violence    Fear of current or ex partner: Not on file    Emotionally abused: Not on file    Physically abused: Not on file    Forced sexual activity: Not on file  Other Topics Concern  . Not on file  Social History Narrative   [employer] for 12 years   [employer] for 16 years      Never married   One daughter      Lives alone   Retail banker   Right-handed   Occasional caffeine use          FAMILY HISTORY:  Family History  Problem  Relation Age of Onset  . COPD Mother   . Heart disease Mother   . Other Father        Never knew his father.  . Thyroid disease Sister   . Arthritis Sister   . Heart disease Sister        bypass  . Colon cancer Neg Hx     CURRENT MEDICATIONS:  Outpatient Encounter Medications as of 09/13/2018  Medication Sig Note  . bortezomib IV (VELCADE) 3.5 MG injection Inject 1.3 mg/m2 into the vein once a week.    . calcitRIOL (ROCALTROL) 0.25 MCG capsule Take 1 capsule by mouth daily.   Marland Kitchen  CYCLOPHOSPHAMIDE IV Inject into the vein once a week.   Marland Kitchen Epoetin Alfa (PROCRIT IJ) Inject as directed. Unsure of dosage- gets this once weekly   . HYDROmorphone (DILAUDID) 4 MG tablet Take 1 tablet (4 mg total) by mouth every 8 (eight) hours as needed for severe pain.   . Misc. Devices MISC Please provide patient with rollaider.  Dx: multiple myeloma C90.0   . Patiromer Sorbitex Calcium (VELTASSA PO) Take by mouth. 09/10/2018: Says unsure of dose- has not got yet so unsure of dose but will be picking up soon  . protein supplement (RESOURCE BENEPROTEIN) POWD Take 1 Scoop by mouth 3 (three) times daily with meals.   . senna (SENOKOT) 8.6 MG tablet Take 1 tablet by mouth 2 (two) times daily.    . sodium bicarbonate 650 MG tablet Take 1,300 mg by mouth 2 (two) times daily.   Marland Kitchen torsemide (DEMADEX) 20 MG tablet Pt taking differently-taking 1 tablet daily, and 2 tablets only if his ankles are really swollen (Patient taking differently: Take 20 mg by mouth daily. )   . valACYclovir (VALTREX) 500 MG tablet Take 1 tablet (500 mg total) by mouth 2 (two) times daily.   . [DISCONTINUED] valACYclovir (VALTREX) 1000 MG tablet Take 1 tablet (1,000 mg total) by mouth daily.   . diphenhydrAMINE (BENADRYL) 25 MG tablet Take 1 tablet (25 mg total) by mouth as needed for itching. (Patient not taking: Reported on 09/10/2018)   . [DISCONTINUED] acyclovir (ZOVIRAX) 200 MG capsule Take 200 mg by mouth daily.    . [DISCONTINUED] lactulose  (CHRONULAC) 10 GM/15ML solution Take 10 g by mouth as needed for mild constipation.    No facility-administered encounter medications on file as of 09/13/2018.     ALLERGIES:  Allergies  Allergen Reactions  . Acetaminophen Nausea Only and Other (See Comments)    Elevated liver enzymes  . Cymbalta [Duloxetine Hcl] Swelling    Facial swelling  . Diclofenac Sodium Swelling       . Imodium [Loperamide] Swelling    facial  . Lyrica [Pregabalin] Swelling  . Morphine Other (See Comments)    Bradycardia   . Vioxx [Rofecoxib] Swelling  . Aleve [Naproxen] Swelling    eye     PHYSICAL EXAM:  ECOG Performance status: 2  Vitals:   09/13/18 1023  BP: (!) 118/98  Pulse: 93  Resp: 18  Temp: 98.3 F (36.8 C)  SpO2: 100%   Filed Weights   09/13/18 1023  Weight: 163 lb 14.4 oz (74.3 kg)    Physical Exam Constitutional:      Appearance: Normal appearance. He is normal weight.  HENT:     Head: Normocephalic.     Nose: Nose normal.     Mouth/Throat:     Mouth: Mucous membranes are moist.     Pharynx: Oropharynx is clear.  Eyes:     Extraocular Movements: Extraocular movements intact.     Conjunctiva/sclera: Conjunctivae normal.  Neck:     Musculoskeletal: Normal range of motion.  Cardiovascular:     Rate and Rhythm: Normal rate and regular rhythm.     Pulses: Normal pulses.     Heart sounds: Normal heart sounds.  Pulmonary:     Effort: Pulmonary effort is normal.     Breath sounds: Normal breath sounds.  Abdominal:     General: Bowel sounds are normal.     Palpations: Abdomen is soft.  Musculoskeletal: Normal range of motion.     Right lower leg: Edema  present.     Left lower leg: Edema present.  Skin:    General: Skin is warm and dry.  Neurological:     Mental Status: He is alert and oriented to person, place, and time. Mental status is at baseline.  Psychiatric:        Mood and Affect: Mood normal.        Behavior: Behavior normal.        Thought Content:  Thought content normal.        Judgment: Judgment normal.      LABORATORY DATA:  I have reviewed the labs as listed.  CBC    Component Value Date/Time   WBC 3.1 (L) 09/13/2018 0940   RBC 2.62 (L) 09/13/2018 0940   HGB 8.6 (L) 09/13/2018 0940   HCT 27.8 (L) 09/13/2018 0940   PLT 249 09/13/2018 0940   MCV 106.1 (H) 09/13/2018 0940   MCH 32.8 09/13/2018 0940   MCHC 30.9 09/13/2018 0940   RDW 19.0 (H) 09/13/2018 0940   LYMPHSABS 0.8 09/13/2018 0940   MONOABS 1.0 09/13/2018 0940   EOSABS 0.2 09/13/2018 0940   BASOSABS 0.0 09/13/2018 0940   CMP Latest Ref Rng & Units 09/13/2018 09/06/2018 08/27/2018  Glucose 70 - 99 mg/dL 90 102(H) 77  BUN 6 - 20 mg/dL 46(H) 44(H) 46(H)  Creatinine 0.61 - 1.24 mg/dL 4.64(H) 4.68(H) 4.39(H)  Sodium 135 - 145 mmol/L 138 140 139  Potassium 3.5 - 5.1 mmol/L 4.5 5.6(H) 5.0  Chloride 98 - 111 mmol/L 106 106 108  CO2 22 - 32 mmol/L 21(L) 22 18(L)  Calcium 8.9 - 10.3 mg/dL 8.2(L) 7.4(L) 7.1(L)  Total Protein 6.5 - 8.1 g/dL 7.1 6.6 6.6  Total Bilirubin 0.3 - 1.2 mg/dL 0.4 0.4 0.5  Alkaline Phos 38 - 126 U/L 59 57 56  AST 15 - 41 U/L 14(L) 14(L) 15  ALT 0 - 44 U/L _0 ASSESSMENT & PLAN:   Kappa light chain myeloma (HCC) 1.  Kappa light chain myeloma: - Presentation with acute renal failure and hypercalcemia. -M spike of 0.4 g.  Serum immunofixation did not show any immunoglobulin heavy chain.  Beta-2 microglobulin of 32.8.  Kappa free light chains of 12,085 and free light chain ratio of 1981.  LDH of 224. - Started on weekly CyBorD on 06/17/2018. 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  Cytogenetic analysis shows complex chromosome aberrations.  Gain of 1 q. was seen.  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 Joelyn Oms of nephrology. - Retacrit 20,000 units weekly was started on 07/22/2018. -M spike on 08/27/2018 is 0.2 g/dL.  Kappa free light chains have decreased to 4599 from 9982  previously.  Light chain ratio has also improved to 647 from 1405.  This showed a very good response to treatment. - Week 11 of cyclophosphamide and Velcade was on 08/27/2018. - Labs are stable to proceed with Cycle 13 of CyBorD today.  Plan to increase Velcade to 1.40m/m2 if serum creatinine remains elevated. Will continue to monitor. We will repeat Myeloma labs at his next visit. - RTC in 1 week.   2.  Hyperkalemia: -Currently taking Veltassa daily.    3. 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 - Currently on Retacrit 20,000 units weekly. Hemoglobin is stable.  4.  ID prophylaxis: -He had a rash with acyclovir.  We will start him on Valtrex 500 twice daily.  4.  Low back pain: -He had rash and itching with oxycodone and hydrocodone. -He is taking Dilaudid 4 mg every 8 hours, controlling well.  5.  Lower extremity edema: -He is taking torsemide 20 mg - 40 mg daily depending on the swelling.           Orders placed this encounter:  Orders Placed This Encounter  Procedures  . CBC with Differential  . Comprehensive metabolic panel  . Kappa/lambda light chains  . Multiple Myeloma Panel (SPEP&IFE w/QIG)      Roger Shelter, [place] [phone]

## 2018-09-13 NOTE — Progress Notes (Signed)
ANC 1.1 today with verbal order ok to treat Dr. Delton Coombes.   Patient tolerated chemotherapy with no complaints voiced.  Peripheral IV site clean and dry with no bruising or swelling noted at site.  Good blood return noted before and after administration of chemotherapy.  Band aid applied.  Patient left by wheelchair with VSS and no s/s of distress noted.

## 2018-09-13 NOTE — Assessment & Plan Note (Signed)
1.  Kappa light chain myeloma: - Presentation with acute renal failure and hypercalcemia. -M spike of 0.4 g.  Serum immunofixation did not show any immunoglobulin heavy chain.  Beta-2 microglobulin of 32.8.  Kappa free light chains of 12,085 and free light chain ratio of 1981.  LDH of 224. - Started on weekly CyBorD on 06/17/2018. 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  Cytogenetic analysis shows complex chromosome aberrations.  Gain of 1 q. was seen.  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 Joelyn Oms of nephrology. - Retacrit 20,000 units weekly was started on 07/22/2018. -M spike on 08/27/2018 is 0.2 g/dL.  Kappa free light chains have decreased to 4599 from 9982 previously.  Light chain ratio has also improved to 647 from 1405.  This showed a very good response to treatment. - Week 11 of cyclophosphamide and Velcade was on 08/27/2018. - Labs are stable to proceed with Cycle 13 of CyBorD today.  Plan to increase Velcade to 1.74m/m2 if serum creatinine remains elevated. Will continue to monitor. We will repeat Myeloma labs at his next visit. - RTC in 1 week.   2.  Hyperkalemia: -Currently taking Veltassa daily.    3. 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 - Currently on Retacrit 20,000 units weekly. Hemoglobin is stable.  4.  ID prophylaxis: -He had a rash with acyclovir.  We will start him on Valtrex 500 twice daily.   4.  Low back pain: -He had rash and itching with oxycodone and hydrocodone. -He is taking Dilaudid 4 mg every 8 hours, controlling well.  5.  Lower extremity edema: -He is taking torsemide 20 mg - 40 mg daily depending on the swelling.

## 2018-09-15 ENCOUNTER — Encounter (HOSPITAL_COMMUNITY): Payer: Self-pay

## 2018-09-15 ENCOUNTER — Other Ambulatory Visit: Payer: Self-pay

## 2018-09-15 ENCOUNTER — Encounter (HOSPITAL_COMMUNITY)
Admission: RE | Admit: 2018-09-15 | Discharge: 2018-09-15 | Disposition: A | Discharge status: Home / Self Care | Payer: Medicare Other | Source: Ambulatory Visit | Attending: General Surgery | Admitting: General Surgery

## 2018-09-16 ENCOUNTER — Other Ambulatory Visit (HOSPITAL_COMMUNITY)
Admission: RE | Admit: 2018-09-16 | Discharge: 2018-09-16 | Disposition: A | Discharge status: Home / Self Care | Payer: Medicare Other | Source: Ambulatory Visit | Attending: General Surgery | Admitting: General Surgery

## 2018-09-16 DIAGNOSIS — Z1159 Encounter for screening for other viral diseases: Secondary | ICD-10-CM | POA: Insufficient documentation

## 2018-09-17 LAB — NOVEL CORONAVIRUS, NAA (HOSP ORDER, SEND-OUT TO REF LAB; TAT 18-24 HRS): SARS-CoV-2, NAA: NOT DETECTED

## 2018-09-19 ENCOUNTER — Other Ambulatory Visit (HOSPITAL_COMMUNITY): Payer: Self-pay | Admitting: Hematology

## 2018-09-20 ENCOUNTER — Encounter (HOSPITAL_COMMUNITY)
Admission: RE | Disposition: A | Discharge status: Home / Self Care | Payer: Self-pay | Source: Home / Self Care | Attending: General Surgery

## 2018-09-20 ENCOUNTER — Inpatient Hospital Stay (HOSPITAL_COMMUNITY): Payer: Medicare Other

## 2018-09-20 ENCOUNTER — Inpatient Hospital Stay (HOSPITAL_BASED_OUTPATIENT_CLINIC_OR_DEPARTMENT_OTHER): Payer: Medicare Other | Admitting: Hematology

## 2018-09-20 ENCOUNTER — Ambulatory Visit (HOSPITAL_COMMUNITY): Payer: Medicare Other | Admitting: Anesthesiology

## 2018-09-20 ENCOUNTER — Ambulatory Visit (HOSPITAL_COMMUNITY)
Admission: RE | Admit: 2018-09-20 | Discharge: 2018-09-20 | Disposition: A | Discharge status: Home / Self Care | Payer: Medicare Other | Attending: General Surgery | Admitting: General Surgery

## 2018-09-20 ENCOUNTER — Other Ambulatory Visit: Payer: Self-pay

## 2018-09-20 ENCOUNTER — Ambulatory Visit (HOSPITAL_COMMUNITY): Payer: Medicare Other

## 2018-09-20 ENCOUNTER — Encounter (HOSPITAL_COMMUNITY): Payer: Self-pay | Admitting: Hematology

## 2018-09-20 ENCOUNTER — Encounter (HOSPITAL_COMMUNITY): Payer: Self-pay | Admitting: *Deleted

## 2018-09-20 VITALS — BP 149/79 | HR 64 | Temp 97.5°F | Resp 18 | Wt 160.0 lb

## 2018-09-20 VITALS — BP 154/78 | HR 76 | Temp 98.4°F | Resp 18

## 2018-09-20 DIAGNOSIS — J449 Chronic obstructive pulmonary disease, unspecified: Secondary | ICD-10-CM | POA: Insufficient documentation

## 2018-09-20 DIAGNOSIS — Z86010 Personal history of colonic polyps: Secondary | ICD-10-CM | POA: Insufficient documentation

## 2018-09-20 DIAGNOSIS — D649 Anemia, unspecified: Secondary | ICD-10-CM | POA: Insufficient documentation

## 2018-09-20 DIAGNOSIS — M1990 Unspecified osteoarthritis, unspecified site: Secondary | ICD-10-CM | POA: Diagnosis not present

## 2018-09-20 DIAGNOSIS — C9000 Multiple myeloma not having achieved remission: Secondary | ICD-10-CM | POA: Diagnosis present

## 2018-09-20 DIAGNOSIS — D631 Anemia in chronic kidney disease: Secondary | ICD-10-CM | POA: Diagnosis not present

## 2018-09-20 DIAGNOSIS — I739 Peripheral vascular disease, unspecified: Secondary | ICD-10-CM | POA: Diagnosis not present

## 2018-09-20 DIAGNOSIS — R600 Localized edema: Secondary | ICD-10-CM

## 2018-09-20 DIAGNOSIS — E875 Hyperkalemia: Secondary | ICD-10-CM | POA: Diagnosis not present

## 2018-09-20 DIAGNOSIS — N185 Chronic kidney disease, stage 5: Secondary | ICD-10-CM

## 2018-09-20 DIAGNOSIS — Z8547 Personal history of malignant neoplasm of testis: Secondary | ICD-10-CM | POA: Insufficient documentation

## 2018-09-20 DIAGNOSIS — Z885 Allergy status to narcotic agent status: Secondary | ICD-10-CM | POA: Insufficient documentation

## 2018-09-20 DIAGNOSIS — Z95828 Presence of other vascular implants and grafts: Secondary | ICD-10-CM

## 2018-09-20 DIAGNOSIS — Z886 Allergy status to analgesic agent status: Secondary | ICD-10-CM | POA: Insufficient documentation

## 2018-09-20 DIAGNOSIS — Z87442 Personal history of urinary calculi: Secondary | ICD-10-CM | POA: Insufficient documentation

## 2018-09-20 DIAGNOSIS — Z9221 Personal history of antineoplastic chemotherapy: Secondary | ICD-10-CM | POA: Diagnosis not present

## 2018-09-20 DIAGNOSIS — N400 Enlarged prostate without lower urinary tract symptoms: Secondary | ICD-10-CM | POA: Diagnosis not present

## 2018-09-20 DIAGNOSIS — Z825 Family history of asthma and other chronic lower respiratory diseases: Secondary | ICD-10-CM | POA: Diagnosis not present

## 2018-09-20 DIAGNOSIS — M5416 Radiculopathy, lumbar region: Secondary | ICD-10-CM | POA: Insufficient documentation

## 2018-09-20 DIAGNOSIS — I2510 Atherosclerotic heart disease of native coronary artery without angina pectoris: Secondary | ICD-10-CM | POA: Diagnosis not present

## 2018-09-20 DIAGNOSIS — Z8261 Family history of arthritis: Secondary | ICD-10-CM | POA: Insufficient documentation

## 2018-09-20 DIAGNOSIS — Z888 Allergy status to other drugs, medicaments and biological substances status: Secondary | ICD-10-CM | POA: Diagnosis not present

## 2018-09-20 DIAGNOSIS — F17210 Nicotine dependence, cigarettes, uncomplicated: Secondary | ICD-10-CM | POA: Insufficient documentation

## 2018-09-20 DIAGNOSIS — Z5112 Encounter for antineoplastic immunotherapy: Secondary | ICD-10-CM | POA: Diagnosis not present

## 2018-09-20 DIAGNOSIS — G709 Myoneural disorder, unspecified: Secondary | ICD-10-CM | POA: Diagnosis not present

## 2018-09-20 DIAGNOSIS — Z79899 Other long term (current) drug therapy: Secondary | ICD-10-CM | POA: Diagnosis not present

## 2018-09-20 DIAGNOSIS — N189 Chronic kidney disease, unspecified: Secondary | ICD-10-CM | POA: Diagnosis not present

## 2018-09-20 DIAGNOSIS — Z8249 Family history of ischemic heart disease and other diseases of the circulatory system: Secondary | ICD-10-CM | POA: Diagnosis not present

## 2018-09-20 HISTORY — PX: PORTACATH PLACEMENT: SHX2246

## 2018-09-20 LAB — COMPREHENSIVE METABOLIC PANEL
ALT: 10 U/L (ref 0–44)
AST: 11 U/L — ABNORMAL LOW (ref 15–41)
Albumin: 4 g/dL (ref 3.5–5.0)
Alkaline Phosphatase: 58 U/L (ref 38–126)
Anion gap: 11 (ref 5–15)
BUN: 49 mg/dL — ABNORMAL HIGH (ref 6–20)
CO2: 20 mmol/L — ABNORMAL LOW (ref 22–32)
Calcium: 7.8 mg/dL — ABNORMAL LOW (ref 8.9–10.3)
Chloride: 107 mmol/L (ref 98–111)
Creatinine, Ser: 4.56 mg/dL — ABNORMAL HIGH (ref 0.61–1.24)
GFR calc Af Amer: 15 mL/min — ABNORMAL LOW (ref >60)
GFR calc non Af Amer: 13 mL/min — ABNORMAL LOW (ref >60)
Glucose, Bld: 84 mg/dL (ref 70–99)
Potassium: 5 mmol/L (ref 3.5–5.1)
Sodium: 138 mmol/L (ref 135–145)
Total Bilirubin: 0.5 mg/dL (ref 0.3–1.2)
Total Protein: 6.6 g/dL (ref 6.5–8.1)

## 2018-09-20 LAB — CBC WITH DIFFERENTIAL/PLATELET
Abs Immature Granulocytes: 0.02 10*3/uL (ref 0.00–0.07)
Basophils Absolute: 0 10*3/uL (ref 0.0–0.1)
Basophils Relative: 1 %
Eosinophils Absolute: 0.2 10*3/uL (ref 0.0–0.5)
Eosinophils Relative: 7 %
HCT: 27.9 % — ABNORMAL LOW (ref 39.0–52.0)
Hemoglobin: 8.9 g/dL — ABNORMAL LOW (ref 13.0–17.0)
Immature Granulocytes: 1 %
Lymphocytes Relative: 31 %
Lymphs Abs: 0.9 10*3/uL (ref 0.7–4.0)
MCH: 33 pg (ref 26.0–34.0)
MCHC: 31.9 g/dL (ref 30.0–36.0)
MCV: 103.3 fL — ABNORMAL HIGH (ref 80.0–100.0)
Monocytes Absolute: 0.8 10*3/uL (ref 0.1–1.0)
Monocytes Relative: 25 %
Neutro Abs: 1.1 10*3/uL — ABNORMAL LOW (ref 1.7–7.7)
Neutrophils Relative %: 35 %
Platelets: 261 10*3/uL (ref 150–400)
RBC: 2.7 MIL/uL — ABNORMAL LOW (ref 4.22–5.81)
RDW: 19.5 % — ABNORMAL HIGH (ref 11.5–15.5)
WBC: 3.1 10*3/uL — ABNORMAL LOW (ref 4.0–10.5)
nRBC: 0 % (ref 0.0–0.2)

## 2018-09-20 LAB — PHOSPHORUS: Phosphorus: 6.5 mg/dL — ABNORMAL HIGH (ref 2.5–4.6)

## 2018-09-20 LAB — MAGNESIUM: Magnesium: 2.1 mg/dL (ref 1.7–2.4)

## 2018-09-20 SURGERY — INSERTION, TUNNELED CENTRAL VENOUS DEVICE, WITH PORT
Anesthesia: Monitor Anesthesia Care | Laterality: Left

## 2018-09-20 MED ORDER — FENTANYL CITRATE (PF) 100 MCG/2ML IJ SOLN
INTRAMUSCULAR | Status: AC
  Filled 2018-09-20: qty 2

## 2018-09-20 MED ORDER — HEPARIN SOD (PORK) LOCK FLUSH 100 UNIT/ML IV SOLN
INTRAVENOUS | Status: DC | PRN
  Administered 2018-09-20: 500 [IU] via INTRAVENOUS

## 2018-09-20 MED ORDER — SODIUM CHLORIDE 0.9 % IV SOLN
20.0000 mg | Freq: Once | INTRAVENOUS | Status: AC
  Administered 2018-09-20: 12:00:00 20 mg via INTRAVENOUS
  Filled 2018-09-20: qty 2

## 2018-09-20 MED ORDER — PROPOFOL 500 MG/50ML IV EMUL
INTRAVENOUS | Status: DC | PRN
  Administered 2018-09-20: 50 ug/kg/min via INTRAVENOUS

## 2018-09-20 MED ORDER — BORTEZOMIB CHEMO SQ INJECTION 3.5 MG (2.5MG/ML)
1.3000 mg/m2 | Freq: Once | INTRAMUSCULAR | Status: AC
  Administered 2018-09-20: 13:00:00 2.5 mg via SUBCUTANEOUS
  Filled 2018-09-20: qty 1

## 2018-09-20 MED ORDER — FENTANYL CITRATE (PF) 100 MCG/2ML IJ SOLN
INTRAMUSCULAR | Status: DC | PRN
  Administered 2018-09-20 (×2): 25 ug via INTRAVENOUS

## 2018-09-20 MED ORDER — LACTATED RINGERS IV SOLN: INTRAVENOUS | Status: DC

## 2018-09-20 MED ORDER — LACTATED RINGERS IV SOLN
INTRAVENOUS | Status: DC
  Administered 2018-09-20: 08:00:00 via INTRAVENOUS

## 2018-09-20 MED ORDER — LIDOCAINE HCL (PF) 1 % IJ SOLN
INTRAMUSCULAR | Status: AC
  Filled 2018-09-20: qty 30

## 2018-09-20 MED ORDER — PALONOSETRON HCL INJECTION 0.25 MG/5ML
0.2500 mg | Freq: Once | INTRAVENOUS | Status: AC
  Administered 2018-09-20: 11:00:00 0.25 mg via INTRAVENOUS
  Filled 2018-09-20: qty 5

## 2018-09-20 MED ORDER — PROMETHAZINE HCL 25 MG/ML IJ SOLN: 6.2500 mg | INTRAMUSCULAR | Status: DC | PRN

## 2018-09-20 MED ORDER — SODIUM CHLORIDE 0.9% FLUSH
10.0000 mL | INTRAVENOUS | Status: DC | PRN
  Administered 2018-09-20: 10:00:00 10 mL
  Filled 2018-09-20: qty 10

## 2018-09-20 MED ORDER — MIDAZOLAM HCL 5 MG/5ML IJ SOLN
INTRAMUSCULAR | Status: DC | PRN
  Administered 2018-09-20: 2 mg via INTRAVENOUS

## 2018-09-20 MED ORDER — HEPARIN SOD (PORK) LOCK FLUSH 100 UNIT/ML IV SOLN
500.0000 [IU] | Freq: Once | INTRAVENOUS | Status: AC | PRN
  Administered 2018-09-20: 13:00:00 500 [IU]

## 2018-09-20 MED ORDER — EPOETIN ALFA-EPBX 10000 UNIT/ML IJ SOLN
20000.0000 [IU] | Freq: Once | INTRAMUSCULAR | Status: AC
  Administered 2018-09-20: 20000 [IU] via SUBCUTANEOUS
  Filled 2018-09-20: qty 2

## 2018-09-20 MED ORDER — CEFAZOLIN SODIUM-DEXTROSE 2-4 GM/100ML-% IV SOLN
2.0000 g | INTRAVENOUS | Status: AC
  Administered 2018-09-20: 2 g via INTRAVENOUS
  Filled 2018-09-20: qty 100

## 2018-09-20 MED ORDER — HEPARIN SOD (PORK) LOCK FLUSH 100 UNIT/ML IV SOLN
INTRAVENOUS | Status: AC
  Filled 2018-09-20: qty 5

## 2018-09-20 MED ORDER — PROPOFOL 10 MG/ML IV BOLUS
INTRAVENOUS | Status: DC | PRN
  Administered 2018-09-20: 15 mg via INTRAVENOUS

## 2018-09-20 MED ORDER — LIDOCAINE HCL (PF) 1 % IJ SOLN
INTRAMUSCULAR | Status: DC | PRN
  Administered 2018-09-20: 10 mL

## 2018-09-20 MED ORDER — SODIUM CHLORIDE 0.9 % IV SOLN
300.0000 mg/m2 | Freq: Once | INTRAVENOUS | Status: AC
  Administered 2018-09-20: 12:00:00 600 mg via INTRAVENOUS
  Filled 2018-09-20: qty 30

## 2018-09-20 MED ORDER — CHLORHEXIDINE GLUCONATE CLOTH 2 % EX PADS: 6.0000 | MEDICATED_PAD | Freq: Once | CUTANEOUS | Status: DC

## 2018-09-20 MED ORDER — SODIUM CHLORIDE (PF) 0.9 % IJ SOLN
INTRAMUSCULAR | Status: DC | PRN
  Administered 2018-09-20: 500 mL via INTRAVENOUS

## 2018-09-20 MED ORDER — MIDAZOLAM HCL 2 MG/2ML IJ SOLN
INTRAMUSCULAR | Status: AC
  Filled 2018-09-20: qty 2

## 2018-09-20 MED ORDER — PROPOFOL 10 MG/ML IV BOLUS
INTRAVENOUS | Status: AC
  Filled 2018-09-20: qty 20

## 2018-09-20 MED ORDER — SODIUM CHLORIDE 0.9 % IV SOLN
Freq: Once | INTRAVENOUS | Status: AC
  Administered 2018-09-20: 11:00:00 via INTRAVENOUS

## 2018-09-20 SURGICAL SUPPLY — 36 items
BAG DECANTER FOR FLEXI CONT (MISCELLANEOUS) ×3 IMPLANT
CHLORAPREP W/TINT 10.5 ML (MISCELLANEOUS) ×3 IMPLANT
CLOTH BEACON ORANGE TIMEOUT ST (SAFETY) ×3 IMPLANT
COVER LIGHT HANDLE STERIS (MISCELLANEOUS) ×6 IMPLANT
COVER WAND RF STERILE (DRAPES) ×3 IMPLANT
DECANTER SPIKE VIAL GLASS SM (MISCELLANEOUS) ×3 IMPLANT
DERMABOND ADVANCED (GAUZE/BANDAGES/DRESSINGS) ×2
DERMABOND ADVANCED .7 DNX12 (GAUZE/BANDAGES/DRESSINGS) ×1 IMPLANT
DRAPE C-ARM FOLDED MOBILE STRL (DRAPES) ×3 IMPLANT
DRSG TEGADERM 4X4.75 (GAUZE/BANDAGES/DRESSINGS) ×2 IMPLANT
ELECT REM PT RETURN 9FT ADLT (ELECTROSURGICAL) ×3
ELECTRODE REM PT RTRN 9FT ADLT (ELECTROSURGICAL) ×1 IMPLANT
GLOVE BIO SURGEON STRL SZ 6.5 (GLOVE) ×2 IMPLANT
GLOVE BIO SURGEONS STRL SZ 6.5 (GLOVE) ×1
GLOVE BIOGEL PI IND STRL 6.5 (GLOVE) ×1 IMPLANT
GLOVE BIOGEL PI IND STRL 7.0 (GLOVE) ×1 IMPLANT
GLOVE BIOGEL PI INDICATOR 6.5 (GLOVE) ×2
GLOVE BIOGEL PI INDICATOR 7.0 (GLOVE) ×4
GLOVE ECLIPSE 6.5 STRL STRAW (GLOVE) ×2 IMPLANT
GOWN STRL REUS W/TWL LRG LVL3 (GOWN DISPOSABLE) ×6 IMPLANT
IV NS 500ML (IV SOLUTION) ×2
IV NS 500ML BAXH (IV SOLUTION) ×1 IMPLANT
KIT PORT POWER 8FR ISP MRI (Port) ×3 IMPLANT
KIT TURNOVER KIT A (KITS) ×3 IMPLANT
MANIFOLD NEPTUNE II (INSTRUMENTS) ×3 IMPLANT
NDL HYPO 25X1 1.5 SAFETY (NEEDLE) ×1 IMPLANT
NEEDLE HYPO 25X1 1.5 SAFETY (NEEDLE) ×3 IMPLANT
PACK MINOR (CUSTOM PROCEDURE TRAY) ×3 IMPLANT
PAD ARMBOARD 7.5X6 YLW CONV (MISCELLANEOUS) ×3 IMPLANT
SET BASIN LINEN APH (SET/KITS/TRAYS/PACK) ×3 IMPLANT
SUT MNCRL AB 4-0 PS2 18 (SUTURE) ×3 IMPLANT
SUT PROLENE 2 0 SH 30 (SUTURE) ×3 IMPLANT
SUT VIC AB 3-0 SH 27 (SUTURE) ×2
SUT VIC AB 3-0 SH 27X BRD (SUTURE) ×1 IMPLANT
SYR 10ML LL (SYRINGE) ×3 IMPLANT
SYR CONTROL 10ML LL (SYRINGE) ×3 IMPLANT

## 2018-09-20 NOTE — Transfer of Care (Signed)
Immediate Anesthesia Transfer of Care Note  Patient: Steffan Caniglia  Procedure(s) Performed: INSERTION PORT-A-CATH (catheter attached left subclavian) (Left )  Patient Location: PACU  Anesthesia Type:MAC  Level of Consciousness: awake and patient cooperative  Airway & Oxygen Therapy: Patient Spontanous Breathing and Patient connected to nasal cannula oxygen  Post-op Assessment: Report given to RN and Post -op Vital signs reviewed and stable  Post vital signs: Reviewed and stable  Last Vitals:  Vitals Value Taken Time  BP    Temp    Pulse 73 09/20/18 0912  Resp 19 09/20/18 0912  SpO2 99 % 09/20/18 0912  Vitals shown include unvalidated device data.  Last Pain:  Vitals:   09/20/18 0811  TempSrc: Oral  PainSc: 4       Patients Stated Pain Goal: 7 (17/53/01 0404)  Complications: No apparent anesthesia complications

## 2018-09-20 NOTE — Patient Instructions (Addendum)
[facility] Discharge Instructions for Patients Receiving Chemotherapy   Beginning January 23rd 2017 lab work for the [facility] will be done in the  Main lab at [facility] on 1st floor. If you have a lab appointment with the [place] please come in thru the  Main Entrance and check in at the main information desk   Today you received the following chemotherapy agents Cytoxan and Velcade as well as Retacrit injection. Follow-up as scheduled. Call clinic for any questions or concerns  To help prevent nausea and vomiting after your treatment, we encourage you to take your nausea medication   If you develop nausea and vomiting, or diarrhea that is not controlled by your medication, call the clinic.  The clinic phone number is [phone]. Office hours are Monday-Friday 8:30am-5:00pm.  BELOW ARE SYMPTOMS THAT SHOULD BE REPORTED IMMEDIATELY:  *FEVER GREATER THAN 101.0 F  *CHILLS WITH OR WITHOUT FEVER  NAUSEA AND VOMITING THAT IS NOT CONTROLLED WITH YOUR NAUSEA MEDICATION  *UNUSUAL SHORTNESS OF BREATH  *UNUSUAL BRUISING OR BLEEDING  TENDERNESS IN MOUTH AND THROAT WITH OR WITHOUT PRESENCE OF ULCERS  *URINARY PROBLEMS  *BOWEL PROBLEMS  UNUSUAL RASH Items with * indicate a potential emergency and should be followed up as soon as possible. If you have an emergency after office hours please contact your primary care physician or go to the nearest emergency department.  Please call the clinic during office hours if you have any questions or concerns.   You may also contact the Patient Navigator at [phone] should you have any questions or need assistance in obtaining follow up care.      Resources For Cancer Patients and their Caregivers ? American Cancer Society: Can assist with transportation, wigs, general needs, runs Look Good Feel Better.        321-478-1981 ? Cancer Care: Provides financial assistance, online support groups,  medication/co-pay assistance.  1-800-813-HOPE (512)647-4877) ? [facility] Assists [place] cancer patients and their families through emotional , educational and financial support.  6120260272 ? [place] DSS Where to apply for food stamps, Medicaid and utility assistance. 813-324-1989 ? [facility]: Transportation to medical appointments. 519-761-8478 ? Social Security Administration: May apply for disability if have a Stage IV cancer. [phone] 626-572-7077 ? [employer], Disability and Transit Services: Assists with nutrition, care and transit needs. [phone]

## 2018-09-20 NOTE — Anesthesia Postprocedure Evaluation (Signed)
Anesthesia Post Note  Patient: [name]  Procedure(s) Performed: INSERTION PORT-A-CATH (catheter attached left subclavian) (Left )  Patient location during evaluation: PACU Anesthesia Type: MAC Level of consciousness: awake and alert and patient cooperative Pain management: pain level controlled Vital Signs Assessment: post-procedure vital signs reviewed and stable Respiratory status: spontaneous breathing and respiratory function stable Cardiovascular status: blood pressure returned to baseline Postop Assessment: no apparent nausea or vomiting Anesthetic complications: no     Last Vitals:  Vitals:   09/20/18 0811  BP: 124/73  Pulse: 76  Resp: 18  Temp: 36.7 C  SpO2: 98%    Last Pain:  Vitals:   09/20/18 0811  TempSrc: Oral  PainSc: 4                  Lillie Portner J

## 2018-09-20 NOTE — Discharge Instructions (Signed)
Monitored Anesthesia Care, Care After These instructions provide you with information about caring for yourself after your procedure. Your health care provider may also give you more specific instructions. Your treatment has been planned according to current medical practices, but problems sometimes occur. Call your health care provider if you have any problems or questions after your procedure. What can I expect after the procedure? After your procedure, you may:  Feel sleepy for several hours.  Feel clumsy and have poor balance for several hours.  Feel forgetful about what happened after the procedure.  Have poor judgment for several hours.  Feel nauseous or vomit.  Have a sore throat if you had a breathing tube during the procedure. Follow these instructions at home: For at least 24 hours after the procedure:      Have a responsible adult stay with you. It is important to have someone help care for you until you are awake and alert.  Rest as needed.  Do not: ? Participate in activities in which you could fall or become injured. ? Drive. ? Use heavy machinery. ? Drink alcohol. ? Take sleeping pills or medicines that cause drowsiness. ? Make important decisions or sign legal documents. ? Take care of children on your own. Eating and drinking  Follow the diet that is recommended by your health care provider.  If you vomit, drink water, juice, or soup when you can drink without vomiting.  Make sure you have little or no nausea before eating solid foods. General instructions  Take over-the-counter and prescription medicines only as told by your health care provider.  If you have sleep apnea, surgery and certain medicines can increase your risk for breathing problems. Follow instructions from your health care provider about wearing your sleep device: ? Anytime you are sleeping, including during daytime naps. ? While taking prescription pain medicines, sleeping medicines,  or medicines that make you drowsy.  If you smoke, do not smoke without supervision.  Keep all follow-up visits as told by your health care provider. This is important. Contact a health care provider if:  You keep feeling nauseous or you keep vomiting.  You feel light-headed.  You develop a rash.  You have a fever. Get help right away if:  You have trouble breathing. Summary  For several hours after your procedure, you may feel sleepy and have poor judgment.  Have a responsible adult stay with you for at least 24 hours or until you are awake and alert. This information is not intended to replace advice given to you by your health care provider. Make sure you discuss any questions you have with your health care provider. Document Released: 07/08/2015 Document Revised: 10/31/2016 Document Reviewed: 07/08/2015 Elsevier Interactive Patient Education  [employer].    Keep area clean and dry. You can take a shower in 24 hours. Do not submerge in water.  Take tylenol and ibuprofen for pain control and you can take one of your dilaudid for severe pain if needed.  Implanted Port Insertion, Care After This sheet gives you information about how to care for yourself after your procedure. Your health care provider may also give you more specific instructions. If you have problems or questions, contact your health care provider. What can I expect after the procedure? After the procedure, it is common to have:  Discomfort at the port insertion site.  Bruising on the skin over the port. This should improve over 3-4 days. Follow these instructions at home: [facility]  After your port is placed, you will get a manufacturer's information card. The card has information about your port. Keep this card with you at all times.  Take care of the port as told by your health care provider. Ask your health care provider if you or a family member can get training for taking care of the port at  home. A home health care nurse may also take care of the port.  Make sure to remember what type of port you have. Incision care      Follow instructions from your health care provider about how to take care of your port insertion site. Make sure you: ? Wash your hands with soap and water before and after you change your bandage (dressing). If soap and water are not available, use hand sanitizer. ? Change your dressing as told by your health care provider. ? Leave stitches (sutures), skin glue, or adhesive strips in place. These skin closures may need to stay in place for 2 weeks or longer. If adhesive strip edges start to loosen and curl up, you may trim the loose edges. Do not remove adhesive strips completely unless your health care provider tells you to do that.  Check your port insertion site every day for signs of infection. Check for: ? Redness, swelling, or pain. ? Fluid or blood. ? Warmth. ? Pus or a bad smell. Activity  Return to your normal activities as told by your health care provider. Ask your health care provider what activities are safe for you.  Do not lift anything that is heavier than 10 lb (4.5 kg), or the limit that you are told, until your health care provider says that it is safe. General instructions  Take over-the-counter and prescription medicines only as told by your health care provider.  Do not take baths, swim, or use a hot tub until your health care provider approves. Ask your health care provider if you may take showers. You may only be allowed to take sponge baths.  Do not drive for 24 hours if you were given a sedative during your procedure.  Wear a medical alert bracelet in case of an emergency. This will tell any health care providers that you have a port.  Keep all follow-up visits as told by your health care provider. This is important. Contact a health care provider if:  You cannot flush your port with saline as directed, or you cannot draw  blood from the port.  You have a fever or chills.  You have redness, swelling, or pain around your port insertion site.  You have fluid or blood coming from your port insertion site.  Your port insertion site feels warm to the touch.  You have pus or a bad smell coming from the port insertion site. Get help right away if:  You have chest pain or shortness of breath.  You have bleeding from your port that you cannot control. Summary  Take care of the port as told by your health care provider. Keep the manufacturer's information card with you at all times.  Change your dressing as told by your health care provider.  Contact a health care provider if you have a fever or chills or if you have redness, swelling, or pain around your port insertion site.  Keep all follow-up visits as told by your health care provider. This information is not intended to replace advice given to you by your health care provider. Make sure you discuss any questions you  have with your health care provider. Document Released: 01/05/2013 Document Revised: 10/13/2017 Document Reviewed: 10/13/2017 Elsevier Interactive Patient Education  [employer].

## 2018-09-20 NOTE — Interval H&P Note (Signed)
History and Physical Interval Note:  09/20/2018 8:06 AM  [name]  has presented today for surgery, with the diagnosis of kappa light chain myeloma.  The various methods of treatment have been discussed with the patient and family. After consideration of risks, benefits and other options for treatment, the patient has consented to  Procedure(s): INSERTION PORT-A-CATH (N/A) as a surgical intervention.  The patient's history has been reviewed, patient examined, no change in status, stable for surgery.  I have reviewed the patient's chart and labs.  Questions were answered to the patient's satisfaction.    Patient without changes. He does have a scar on the left neck. [name] used to identify the IJ on the left which is patent and large. Plan to start on left subclavian. Patient with treatment today so will leave accessed. Discussed with Kershaw.   Virl Cagey

## 2018-09-20 NOTE — Progress Notes (Signed)
[name], [place] [address]   CLINIC:  Medical Oncology/Hematology  PCP:  Rosita Fire, MD [place] [id number] [phone]   REASON FOR VISIT: Follow-up for Multiple myeloma    BRIEF ONCOLOGIC HISTORY:  Oncology History  Kappa light chain myeloma ([place])  06/16/2018 Initial Diagnosis   Kappa light chain myeloma ([place])   06/17/2018 -  Chemotherapy   The patient had palonosetron (ALOXI) injection 0.25 mg, 0.25 mg, Intravenous,  Once, 14 of 16 cycles Administration: 0.25 mg (06/17/2018), 0.25 mg (06/23/2018), 0.25 mg (06/30/2018), 0.25 mg (07/07/2018), 0.25 mg (07/14/2018), 0.25 mg (07/22/2018), 0.25 mg (07/29/2018), 0.25 mg (08/06/2018), 0.25 mg (08/13/2018), 0.25 mg (08/20/2018), 0.25 mg (08/27/2018), 0.25 mg (09/06/2018), 0.25 mg (09/13/2018) bortezomib SQ (VELCADE) chemo injection 2.5 mg, 1.3 mg/m2 = 2.5 mg, Subcutaneous,  Once, 14 of 16 cycles Administration: 2.5 mg (06/17/2018), 2.5 mg (06/23/2018), 2.5 mg (06/30/2018), 2.5 mg (07/07/2018), 2.5 mg (07/14/2018), 2.5 mg (07/22/2018), 2.5 mg (07/29/2018), 2.5 mg (08/06/2018), 2.5 mg (08/13/2018), 2.5 mg (08/20/2018), 2.5 mg (08/27/2018), 2.5 mg (09/06/2018), 2.5 mg (09/13/2018) cyclophosphamide (CYTOXAN) 300 mg in sodium chloride 0.9 % 250 mL chemo infusion, 300 mg (100 % of original dose 300 mg), Intravenous,  Once, 14 of 16 cycles Dose modification: 300 mg (original dose 300 mg, Cycle 1, Reason: Change in SCr/CrCl), 500 mg (original dose 500 mg, Cycle 2, Reason: Provider Judgment), 500 mg (original dose 500 mg, Cycle 3, Reason: Change in SCr/CrCl) Administration: 300 mg (06/17/2018), 500 mg (06/23/2018), 500 mg (06/30/2018), 600 mg (07/07/2018), 600 mg (07/14/2018), 600 mg (07/22/2018), 600 mg (07/29/2018), 600 mg (08/06/2018), 600 mg (08/13/2018), 600 mg (08/20/2018), 600 mg (08/27/2018), 600 mg (09/06/2018), 600 mg (09/13/2018)  for chemotherapy treatment.       INTERVAL HISTORY:  [name] 58 y.o. male returns  for routine follow-up for multiple myeloma. He reports he has been doing well since his last visit. He does have numbness on the bottoms of his feet that comes and goes. He is also SOB with exertion. Denies any nausea, vomiting, or diarrhea. Denies any new pains. Had not noticed any recent bleeding such as epistaxis, hematuria or hematochezia. Denies recent chest pain on exertion, shortness of breath on minimal exertion, pre-syncopal episodes, or palpitations. Denies any numbness or tingling in hands or feet. Denies any recent fevers, infections, or recent hospitalizations. Patient reports appetite at 50% and energy level at 50%. He is eating well and maintaining his weight at this time.      REVIEW OF SYSTEMS:  Review of Systems  Respiratory: Positive for cough and shortness of breath.   Neurological: Positive for numbness.  All other systems reviewed and are negative.    PAST MEDICAL/SURGICAL HISTORY:  Past Medical History:  Diagnosis Date  . Allergy   . Arthritis    neck and back  . Blood transfusion without reported diagnosis   . BPH (benign prostatic hyperplasia)   . Cancer [facility]) 2004   testicle  . Chronic kidney disease    kidney stones  . Left lumbar radiculopathy 06/12/2016  . 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 Medical history non-contributory    Pt has scattered thoughts and uncertain of past medical history  . 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 Stones, urinary tract   . Substance abuse ([place])    prescribed oxydcodone- 10 years   Past Surgical History:  Procedure Laterality Date  . BACK SURGERY     x5  . CERVICAL DISC SURGERY  x2  . COLONOSCOPY WITH PROPOFOL N/A 08/11/2016   Dr. Rourk: non-bleeding internal hemorrhoids, one 4 mm hyperplastic rectal polyp, diverticulosis in entire examined colon  . POLYPECTOMY  08/11/2016   Procedure: POLYPECTOMY;  Surgeon: Rourk, Robert M, MD;  Location: [facility];  Service: Endoscopy;;  colon  . testicular cancer  2004  .  TONSILLECTOMY       SOCIAL HISTORY:  Social History   Socioeconomic History  . Marital status: Single    Spouse name: Not on file  . Number of children: 1  . Years of education: 16  . Highest education level: Not on file  Occupational History  . Occupation: retired    Comment: [employer]  . Occupation: disabled  Social Needs  . Financial resource strain: Not on file  . Food insecurity    Worry: Not on file    Inability: Not on file  . Transportation needs    Medical: Not on file    Non-medical: Not on file  Tobacco Use  . Smoking status: Current Every Day Smoker    Packs/day: 1.00    Years: 40.00    Pack years: 40.00    Types: Cigarettes    Start date: 03/31/1974  . Smokeless tobacco: Never Used  Substance and Sexual Activity  . Alcohol use: Yes    Comment: Occasional  . Drug use: No    Types: Cocaine    Comment: Remote hx of cocaine, quit 1989  . Sexual activity: Yes  Lifestyle  . Physical activity    Days per week: Not on file    Minutes per session: Not on file  . Stress: Not on file  Relationships  . Social connections    Talks on phone: Not on file    Gets together: Not on file    Attends religious service: Not on file    Active member of club or organization: Not on file    Attends meetings of clubs or organizations: Not on file    Relationship status: Not on file  . Intimate partner violence    Fear of current or ex partner: Not on file    Emotionally abused: Not on file    Physically abused: Not on file    Forced sexual activity: Not on file  Other Topics Concern  . Not on file  Social History Narrative   [employer] for 12 years   [employer] for 20 years      Never married   One daughter      Lives alone   Collect stamps   Plays trumpet   Right-handed   Occasional caffeine use          FAMILY HISTORY:  Family History  Problem Relation Age of Onset  . COPD Mother   . Heart disease Mother   . Other Father        Never knew his father.   . Thyroid disease Sister   . Arthritis Sister   . Heart disease Sister        bypass  . Colon cancer Neg Hx     CURRENT MEDICATIONS:  Outpatient Encounter Medications as of 09/20/2018  Medication Sig  . bortezomib IV (VELCADE) 3.5 MG injection Inject 1.3 mg/m2 into the vein once a week.   . calcitRIOL (ROCALTROL) 0.25 MCG capsule Take 1 capsule by mouth daily.  . CYCLOPHOSPHAMIDE IV Inject into the vein once a week.  . Epoetin Alfa (PROCRIT IJ) Inject as directed. Unsure of dosage-   gets this once weekly  . HYDROmorphone (DILAUDID) 4 MG tablet Take 1 tablet (4 mg total) by mouth every 8 (eight) hours as needed for severe pain.  . Misc. Devices MISC Please provide patient with rollaider.  Dx: multiple myeloma C90.0  . Patiromer Sorbitex Calcium (VELTASSA PO) Take by mouth.  . protein supplement (RESOURCE BENEPROTEIN) POWD Take 1 Scoop by mouth 3 (three) times daily with meals.  . senna (SENOKOT) 8.6 MG tablet Take 1 tablet by mouth 2 (two) times daily.   . sodium bicarbonate 650 MG tablet Take 1,300 mg by mouth 2 (two) times daily.  . torsemide (DEMADEX) 20 MG tablet Pt taking differently-taking 1 tablet daily, and 2 tablets only if his ankles are really swollen (Patient taking differently: Take 20 mg by mouth daily. )  . valACYclovir (VALTREX) 500 MG tablet Take 1 tablet (500 mg total) by mouth 2 (two) times daily.  . diphenhydrAMINE (BENADRYL) 25 MG tablet Take 1 tablet (25 mg total) by mouth as needed for itching. (Patient not taking: Reported on 09/10/2018)   Facility-Administered Encounter Medications as of 09/20/2018  Medication  . [DISCONTINUED] Chlorhexidine Gluconate Cloth 2 % PADS 6 each  . [DISCONTINUED] Chlorhexidine Gluconate Cloth 2 % PADS 6 each  . [DISCONTINUED] lactated ringers infusion  . [DISCONTINUED] lactated ringers infusion  . [DISCONTINUED] promethazine (PHENERGAN) injection 6.25-12.5 mg    ALLERGIES:  Allergies  Allergen Reactions  . Oxycodone-Acetaminophen    . Acetaminophen Nausea Only and Other (See Comments)    Elevated liver enzymes  . Cymbalta [Duloxetine Hcl] Swelling    Facial swelling  . Diclofenac Sodium Swelling       . Imodium [Loperamide] Swelling    facial  . Lyrica [Pregabalin] Swelling  . Morphine Other (See Comments)    Bradycardia   . Vioxx [Rofecoxib] Swelling  . Aleve [Naproxen] Swelling    eye     PHYSICAL EXAM:  ECOG Performance status: 1  Vitals:   09/20/18 1018  BP: (!) 149/79  Pulse: 64  Resp: 18  Temp: (!) 97.5 F (36.4 C)  SpO2: 100%   Filed Weights   09/20/18 1018  Weight: 160 lb (72.6 kg)    Physical Exam Vitals signs reviewed.  Constitutional:      Appearance: Normal appearance. He is normal weight.  Cardiovascular:     Rate and Rhythm: Normal rate and regular rhythm.     Heart sounds: Normal heart sounds.  Pulmonary:     Effort: Pulmonary effort is normal.     Breath sounds: Normal breath sounds.  Abdominal:     General: Bowel sounds are normal.     Palpations: Abdomen is soft.  Musculoskeletal: Normal range of motion.     Right lower leg: Edema present.     Left lower leg: Edema present.  Skin:    General: Skin is warm and dry.  Neurological:     Mental Status: He is alert and oriented to person, place, and time. Mental status is at baseline.  Psychiatric:        Mood and Affect: Mood normal.        Behavior: Behavior normal.      LABORATORY DATA:  I have reviewed the labs as listed.  CBC    Component Value Date/Time   WBC 3.1 (L) 09/20/2018 1001   RBC 2.70 (L) 09/20/2018 1001   HGB 8.9 (L) 09/20/2018 1001   HCT 27.9 (L) 09/20/2018 1001   PLT 261 09/20/2018 1001   MCV 103.3 (  H) 09/20/2018 1001   MCH 33.0 09/20/2018 1001   MCHC 31.9 09/20/2018 1001   RDW 19.5 (H) 09/20/2018 1001   LYMPHSABS 0.9 09/20/2018 1001   MONOABS 0.8 09/20/2018 1001   EOSABS 0.2 09/20/2018 1001   BASOSABS 0.0 09/20/2018 1001   CMP Latest Ref Rng & Units 09/20/2018 09/13/2018 09/06/2018   Glucose 70 - 99 mg/dL 84 90 102(H)  BUN 6 - 20 mg/dL 49(H) 46(H) 44(H)  Creatinine 0.61 - 1.24 mg/dL 4.56(H) 4.64(H) 4.68(H)  Sodium 135 - 145 mmol/L 138 138 140  Potassium 3.5 - 5.1 mmol/L 5.0 4.5 5.6(H)  Chloride 98 - 111 mmol/L 107 106 106  CO2 22 - 32 mmol/L 20(L) 21(L) 22  Calcium 8.9 - 10.3 mg/dL 7.8(L) 8.2(L) 7.4(L)  Total Protein 6.5 - 8.1 g/dL 6.6 7.1 6.6  Total Bilirubin 0.3 - 1.2 mg/dL 0.5 0.4 0.4  Alkaline Phos 38 - 126 U/L 58 59 57  AST 15 - 41 U/L 11(L) 14(L) 14(L)  ALT 0 - 44 U/L _0 DIAGNOSTIC IMAGING:  I have independently reviewed the scans and discussed with the patient.   I have reviewed Francene Finders, NP's note and agree with the documentation.  I personally performed a face-to-face visit, made revisions and my assessment and plan is as follows.    ASSESSMENT & PLAN:   Kappa light chain myeloma ([place]) 1.  Kappa light chain myeloma: - Presentation with acute renal failure and hypercalcemia. -M spike of 0.4 g.  Serum immunofixation did not show any immunoglobulin heavy chain.  Beta-2 microglobulin of 32.8.  Kappa free light chains of 12,085 and free light chain ratio of 1981.  LDH of 224. - Started on weekly CyBorD on 06/17/2018. 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  Cytogenetic analysis shows complex chromosome aberrations.  Gain of 1 q. was seen.  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 Joelyn Oms of nephrology. - Retacrit 20,000 units weekly was started on 07/22/2018. -M spike on 08/27/2018 is 0.2.  Kappa free light chains have decreased to 4599.  Light chain ratio also improved to 647. -He is tolerating Velcade and cyclophosphamide weekly very well.  He has mild numbness in the bottom of the feet but not constant. -I reviewed his labs today.  He may proceed with his next treatment.  I plan to repeat his myeloma panel next week and see him back in 2 weeks to discuss the results.   2.   Hyperkalemia: -He is taking Veltassa regularly.  Magnesium is within normal limits.  Potassium today is 5.0.   3.  Macrocytic anemia: -He is receiving K 20,000 units weekly.  Hemoglobin is 8.9.    4.  ID prophylaxis: -He had a rash with acyclovir.  We will start him on Valtrex 500 twice daily.   4.  Low back pain: -He had rash and itching with oxycodone and hydrocodone. -He is taking Dilaudid 4 mg every 8 hours, controlling well.  5.  Lower extremity edema: -He is taking torsemide 40 mg daily.        Total time spent is 25 minutes with more than 50% of the time spent face-to-face discussing treatment plan and coordination of care.    Orders placed this encounter:  Orders Placed This Encounter  Procedures  . Lactate dehydrogenase  . Protein electrophoresis, serum  . Kappa/lambda light chains  . CBC with Differential/Platelet  .  Comprehensive metabolic panel  . Magnesium  . Phosphorus      Sreedhar Katragadda, MD [facility] [phone]  

## 2018-09-20 NOTE — Op Note (Signed)
Operative Note 09/20/18   Preoperative Diagnosis:  Multiple myeloma    Postoperative Diagnosis: Same   Procedure(s) Performed: Port-A-Cath placement, left subclavian    Surgeon: Lanell Matar. Constance Haw, MD   Assistants: No qualified resident was available   Anesthesia: Monitored anesthesia care   Anesthesiologist: Dr. Currie Paris    Specimens: None   Estimated Blood Loss: Minimal   Blood Replacement: None    Complications: None    Operative Findings: Normal anatomy  Indications: Mr. Pardee is a 58 yo with multiple myeloma requiring more aggressive treatment. He has been receiving treatment in the peripheral veins and needs to transition to a port a catheter.  He is due to treatment today. We discussed the risk and benefits of the procedure including but not limited to bleeding, infection, malfunction, pneumothorax, and he opted to proceed. We will leave the port accessed for chemotherapy later today.   Procedure: The patient was brought into the operating room and monitored anesthesia care was induced.    The left chest and neck was prepped and draped in the usual sterile fashion.  Preoperative antibiotics were given.   One percent lidocaine was used for local anesthesia.  An incision was made below the left clavicle. A subcutaneous pocket was formed. The needles advanced into the left subclavian vein using the Seldinger technique without difficulty. A guidewire was then advanced into the right atrium under fluoroscopic guidance.  Ectopia was not noted. An introducer and peel-away sheath were placed over the guidewire. The catheter was then inserted through the peel-away sheath and the peel-away sheath was removed.  A spot film was performed to confirm the position. The catheter was then attached to the port and the port placed in subcutaneous pocket. Adequate positioning was confirmed by fluoroscopy. Hemostasis was confirmed, and the port was secured with 2-0 prolene sutures.  Good backflow  of blood was noted on aspiration of the port. The port was flushed with heparin flush. Subcutaneous layer was reapproximated using a 3-0 Vicryl interrupted suture. The skin was closed using a 4-0 Vicryl subcuticular suture. Dermabond was applied. A 90 degree huber needle was placed into the port with infusion catheter to leave it accessed for chemotherapy today. This was secured with a Tegaderm.   All tape and needle counts were correct at the end of the procedure. The patient was transferred to PACU in stable condition. A chest x-ray will be performed at that time.  Curlene Labrum, MD [facility] [address] [place], [place] [address] [phone] (office)

## 2018-09-20 NOTE — Progress Notes (Signed)
[facility]  [place] [facility] Checked. Dilaudid 4 mg 84 tablet prescribed by Dr. Delton Coombes. No additional narcotics will be written for the port placement.  Spoke with Neoma Laming, sister, notified that the procedure went well.  CXR pending.  Curlene Labrum, MD [facility] [address] [place], [place] [address] [phone] (office)

## 2018-09-20 NOTE — Patient Instructions (Signed)
[employer] at [facility] Discharge Instructions  Labs and treatment in 1 weeks follow up with Dr. Raliegh Ip in 2 weeks with labs and treatment.    Thank you for choosing [place] at [facility] to provide your oncology and hematology care.  To afford each patient quality time with our provider, please arrive at least 15 minutes before your scheduled appointment time.   If you have a lab appointment with the [facility] please come in thru the  Main Entrance and check in at the main information desk  You need to re-schedule your appointment should you arrive 10 or more minutes late.  We strive to give you quality time with our providers, and arriving late affects you and other patients whose appointments are after yours.  Also, if you no show three or more times for appointments you may be dismissed from the clinic at the providers discretion.     Again, thank you for choosing [facility].  Our hope is that these requests will decrease the amount of time that you wait before being seen by our physicians.       _____________________________________________________________  Should you have questions after your visit to [facility], please contact our office at [phone] between the hours of 8:00 a.m. and 4:30 p.m.  Voicemails left after 4:00 p.m. will not be returned until the following business day.  For prescription refill requests, have your pharmacy contact our office and allow 72 hours.    Cancer Center Support Programs:   > Cancer Support Group  2nd Tuesday of the month 1pm-2pm, Journey Room

## 2018-09-20 NOTE — Progress Notes (Signed)
1105 Labs, including ANC 1.1, reviewed with and pt seen by Dr. Delton Coombes and pt approved for chemo tx today per MD                                                                                            [name] tolerated Cytoxan infusion and Velcade and Retacrit injections well without complaints or incident.Hgb 8.9 today. CXR results reviewed prior to using new portacath which was placed this am. VSS upon discharge. Pt discharged via wheelchair in satisfactory condition

## 2018-09-20 NOTE — Progress Notes (Signed)
[facility]  CXR without pneumothorax. In expected position.  Curlene Labrum, MD [facility] [place] [address] [place], [place] [address] [phone] (office)

## 2018-09-20 NOTE — Anesthesia Preprocedure Evaluation (Signed)
Anesthesia Evaluation    Airway Mallampati: II       Dental  (+) Teeth Intact   Pulmonary pneumonia, COPD, Current Smoker,    breath sounds clear to auscultation       Cardiovascular  Rhythm:regular     Neuro/Psych  Neuromuscular disease    GI/Hepatic   Endo/Other    Renal/GU CRFRenal disease     Musculoskeletal   Abdominal   Peds  Hematology  (+) Blood dyscrasia, anemia ,   Anesthesia Other Findings Multiple myeloma ASCVD, PVD  Reproductive/Obstetrics                             Anesthesia Physical Anesthesia Plan  ASA: IV  Anesthesia Plan: MAC   Post-op Pain Management:    Induction:   PONV Risk Score and Plan:   Airway Management Planned:   Additional Equipment:   Intra-op Plan:   Post-operative Plan:   Informed Consent: I have reviewed the patients History and Physical, chart, labs and discussed the procedure including the risks, benefits and alternatives for the proposed anesthesia with the patient or authorized representative who has indicated his/her understanding and acceptance.       Plan Discussed with: Anesthesiologist  Anesthesia Plan Comments:         Anesthesia Quick Evaluation

## 2018-09-20 NOTE — Assessment & Plan Note (Signed)
1.  Kappa light chain myeloma: - Presentation with acute renal failure and hypercalcemia. -M spike of 0.4 g.  Serum immunofixation did not show any immunoglobulin heavy chain.  Beta-2 microglobulin of 32.8.  Kappa free light chains of 12,085 and free light chain ratio of 1981.  LDH of 224. - Started on weekly CyBorD on 06/17/2018. 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  Cytogenetic analysis shows complex chromosome aberrations.  Gain of 1 q. was seen.  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 Joelyn Oms of nephrology. - Retacrit 20,000 units weekly was started on 07/22/2018. -M spike on 08/27/2018 is 0.2.  Kappa free light chains have decreased to 4599.  Light chain ratio also improved to 647. -He is tolerating Velcade and cyclophosphamide weekly very well.  He has mild numbness in the bottom of the feet but not constant. -I reviewed his labs today.  He may proceed with his next treatment.  I plan to repeat his myeloma panel next week and see him back in 2 weeks to discuss the results.   2.  Hyperkalemia: -He is taking Veltassa regularly.  Magnesium is within normal limits.  Potassium today is 5.0.   3.  Macrocytic anemia: -He is receiving K 20,000 units weekly.  Hemoglobin is 8.9.    4.  ID prophylaxis: -He had a rash with acyclovir.  We will start him on Valtrex 500 twice daily.   4.  Low back pain: -He had rash and itching with oxycodone and hydrocodone. -He is taking Dilaudid 4 mg every 8 hours, controlling well.  5.  Lower extremity edema: -He is taking torsemide 40 mg daily.

## 2018-09-21 ENCOUNTER — Encounter (HOSPITAL_COMMUNITY): Payer: Self-pay | Admitting: General Surgery

## 2018-09-27 ENCOUNTER — Other Ambulatory Visit: Payer: Self-pay

## 2018-09-27 ENCOUNTER — Inpatient Hospital Stay (HOSPITAL_COMMUNITY): Payer: Medicare Other

## 2018-09-27 ENCOUNTER — Other Ambulatory Visit (HOSPITAL_COMMUNITY): Payer: Self-pay | Admitting: *Deleted

## 2018-09-27 ENCOUNTER — Encounter (HOSPITAL_COMMUNITY): Payer: Self-pay

## 2018-09-27 VITALS — BP 122/59 | HR 85 | Temp 98.5°F | Resp 18 | Wt 165.4 lb

## 2018-09-27 DIAGNOSIS — C9000 Multiple myeloma not having achieved remission: Secondary | ICD-10-CM

## 2018-09-27 DIAGNOSIS — N185 Chronic kidney disease, stage 5: Secondary | ICD-10-CM

## 2018-09-27 DIAGNOSIS — Z5112 Encounter for antineoplastic immunotherapy: Secondary | ICD-10-CM | POA: Diagnosis not present

## 2018-09-27 LAB — COMPREHENSIVE METABOLIC PANEL
ALT: 8 U/L (ref 0–44)
AST: 14 U/L — ABNORMAL LOW (ref 15–41)
Albumin: 4 g/dL (ref 3.5–5.0)
Alkaline Phosphatase: 60 U/L (ref 38–126)
Anion gap: 15 (ref 5–15)
BUN: 49 mg/dL — ABNORMAL HIGH (ref 6–20)
CO2: 19 mmol/L — ABNORMAL LOW (ref 22–32)
Calcium: 7.7 mg/dL — ABNORMAL LOW (ref 8.9–10.3)
Chloride: 104 mmol/L (ref 98–111)
Creatinine, Ser: 4.33 mg/dL — ABNORMAL HIGH (ref 0.61–1.24)
GFR calc Af Amer: 16 mL/min — ABNORMAL LOW (ref >60)
GFR calc non Af Amer: 14 mL/min — ABNORMAL LOW (ref >60)
Glucose, Bld: 123 mg/dL — ABNORMAL HIGH (ref 70–99)
Potassium: 4.6 mmol/L (ref 3.5–5.1)
Sodium: 138 mmol/L (ref 135–145)
Total Bilirubin: 0.4 mg/dL (ref 0.3–1.2)
Total Protein: 6.7 g/dL (ref 6.5–8.1)

## 2018-09-27 LAB — CBC WITH DIFFERENTIAL/PLATELET
Abs Immature Granulocytes: 0.03 10*3/uL (ref 0.00–0.07)
Basophils Absolute: 0 10*3/uL (ref 0.0–0.1)
Basophils Relative: 1 %
Eosinophils Absolute: 0.2 10*3/uL (ref 0.0–0.5)
Eosinophils Relative: 5 %
HCT: 27.4 % — ABNORMAL LOW (ref 39.0–52.0)
Hemoglobin: 8.8 g/dL — ABNORMAL LOW (ref 13.0–17.0)
Immature Granulocytes: 1 %
Lymphocytes Relative: 18 %
Lymphs Abs: 0.9 10*3/uL (ref 0.7–4.0)
MCH: 33.6 pg (ref 26.0–34.0)
MCHC: 32.1 g/dL (ref 30.0–36.0)
MCV: 104.6 fL — ABNORMAL HIGH (ref 80.0–100.0)
Monocytes Absolute: 0.8 10*3/uL (ref 0.1–1.0)
Monocytes Relative: 18 %
Neutro Abs: 2.6 10*3/uL (ref 1.7–7.7)
Neutrophils Relative %: 57 %
Platelets: 231 10*3/uL (ref 150–400)
RBC: 2.62 MIL/uL — ABNORMAL LOW (ref 4.22–5.81)
RDW: 19.3 % — ABNORMAL HIGH (ref 11.5–15.5)
WBC: 4.6 10*3/uL (ref 4.0–10.5)
nRBC: 0 % (ref 0.0–0.2)

## 2018-09-27 LAB — PHOSPHORUS: Phosphorus: 7 mg/dL — ABNORMAL HIGH (ref 2.5–4.6)

## 2018-09-27 LAB — LACTATE DEHYDROGENASE: LDH: 164 U/L (ref 98–192)

## 2018-09-27 LAB — MAGNESIUM: Magnesium: 2 mg/dL (ref 1.7–2.4)

## 2018-09-27 MED ORDER — HYDROMORPHONE HCL 4 MG PO TABS: 4.0000 mg | ORAL_TABLET | Freq: Three times a day (TID) | ORAL | 0 refills | Status: DC | PRN

## 2018-09-27 MED ORDER — EPOETIN ALFA-EPBX 10000 UNIT/ML IJ SOLN
20000.0000 [IU] | Freq: Once | INTRAMUSCULAR | Status: AC
  Administered 2018-09-27: 20000 [IU] via SUBCUTANEOUS
  Filled 2018-09-27: qty 2

## 2018-09-27 MED ORDER — SODIUM CHLORIDE 0.9% FLUSH
10.0000 mL | INTRAVENOUS | Status: DC | PRN
  Administered 2018-09-27: 10 mL
  Filled 2018-09-27: qty 10

## 2018-09-27 MED ORDER — EPOETIN ALFA-EPBX 10000 UNIT/ML IJ SOLN
INTRAMUSCULAR | Status: AC
  Filled 2018-09-27: qty 1

## 2018-09-27 MED ORDER — BORTEZOMIB CHEMO SQ INJECTION 3.5 MG (2.5MG/ML)
1.3000 mg/m2 | Freq: Once | INTRAMUSCULAR | Status: AC
  Administered 2018-09-27: 2.5 mg via SUBCUTANEOUS
  Filled 2018-09-27: qty 1

## 2018-09-27 MED ORDER — SODIUM CHLORIDE 0.9 % IV SOLN
Freq: Once | INTRAVENOUS | Status: AC
  Administered 2018-09-27: 10:00:00 via INTRAVENOUS

## 2018-09-27 MED ORDER — SODIUM CHLORIDE 0.9 % IV SOLN
20.0000 mg | Freq: Once | INTRAVENOUS | Status: AC
  Administered 2018-09-27: 20 mg via INTRAVENOUS
  Filled 2018-09-27: qty 20

## 2018-09-27 MED ORDER — SODIUM CHLORIDE 0.9 % IV SOLN
300.0000 mg/m2 | Freq: Once | INTRAVENOUS | Status: AC
  Administered 2018-09-27: 600 mg via INTRAVENOUS
  Filled 2018-09-27: qty 30

## 2018-09-27 MED ORDER — HEPARIN SOD (PORK) LOCK FLUSH 100 UNIT/ML IV SOLN
500.0000 [IU] | Freq: Once | INTRAVENOUS | Status: AC | PRN
  Administered 2018-09-27: 12:00:00 500 [IU]

## 2018-09-27 MED ORDER — PALONOSETRON HCL INJECTION 0.25 MG/5ML
0.2500 mg | Freq: Once | INTRAVENOUS | Status: AC
  Administered 2018-09-27: 0.25 mg via INTRAVENOUS
  Filled 2018-09-27: qty 5

## 2018-09-27 NOTE — Patient Instructions (Signed)
[facility] at [facility] Discharge Instructions  Labs drawn from portacath today   Thank you for choosing [facility] at [facility] to provide your oncology and hematology care.  To afford each patient quality time with our provider, please arrive at least 15 minutes before your scheduled appointment time.   If you have a lab appointment with the Cancer Center please come in thru the  Main Entrance and check in at the main information desk  You need to re-schedule your appointment should you arrive 10 or more minutes late.  We strive to give you quality time with our providers, and arriving late affects you and other patients whose appointments are after yours.  Also, if you no show three or more times for appointments you may be dismissed from the clinic at the providers discretion.     Again, thank you for choosing [facility].  Our hope is that these requests will decrease the amount of time that you wait before being seen by our physicians.       _____________________________________________________________  Should you have questions after your visit to  Cancer Center, please contact our office at [phone] between the hours of 8:00 a.m. and 4:30 p.m.  Voicemails left after 4:00 p.m. will not be returned until the following business day.  For prescription refill requests, have your pharmacy contact our office and allow 72 hours.    Cancer Center Support Programs:   > Cancer Support Group  2nd Tuesday of the month 1pm-2pm, Journey Room   

## 2018-09-27 NOTE — Progress Notes (Signed)
0063 Labs reviewed with Dr. Delton Coombes and pt approved for chemo tx today per MD                                                       [name] tolerated Cytoxan infusion and Velcade and Retacrit injections well without complaints or incident. Hgb 8.8 today. VSS upon discharge. Pt discharged self ambulatory in satisfactory condition

## 2018-09-27 NOTE — Patient Instructions (Signed)
[facility] Discharge Instructions for Patients Receiving Chemotherapy   Beginning January 23rd 2017 lab work for the [facility] will be done in the  Main lab at [facility] on 1st floor. If you have a lab appointment with the [facility] please come in thru the  Main Entrance and check in at the main information desk   Today you received the following chemotherapy agents Cytoxan and Velcade as well as Retacrit injection  To help prevent nausea and vomiting after your treatment, we encourage you to take your nausea medication    If you develop nausea and vomiting, or diarrhea that is not controlled by your medication, call the clinic.  The clinic phone number is [phone]. Office hours are Monday-Friday 8:30am-5:00pm.  BELOW ARE SYMPTOMS THAT SHOULD BE REPORTED IMMEDIATELY:  *FEVER GREATER THAN 101.0 F  *CHILLS WITH OR WITHOUT FEVER  NAUSEA AND VOMITING THAT IS NOT CONTROLLED WITH YOUR NAUSEA MEDICATION  *UNUSUAL SHORTNESS OF BREATH  *UNUSUAL BRUISING OR BLEEDING  TENDERNESS IN MOUTH AND THROAT WITH OR WITHOUT PRESENCE OF ULCERS  *URINARY PROBLEMS  *BOWEL PROBLEMS  UNUSUAL RASH Items with * indicate a potential emergency and should be followed up as soon as possible. If you have an emergency after office hours please contact your primary care physician or go to the nearest emergency department.  Please call the clinic during office hours if you have any questions or concerns.   You may also contact the Patient Navigator at [phone] should you have any questions or need assistance in obtaining follow up care.      Resources For Cancer Patients and their Caregivers ? American Cancer Society: Can assist with transportation, wigs, general needs, runs Look Good Feel Better.        7033505593 ? Cancer Care: Provides financial assistance, online support groups, medication/co-pay assistance.  1-800-813-HOPE 845-673-6562) ? [place] Assists [place] cancer patients and their families through emotional , educational and financial support.  (410)044-7234 ? [place] DSS Where to apply for food stamps, Medicaid and utility assistance. 306-753-2547 ? [facility]: Transportation to medical appointments. 223-780-0812 ? Social Security Administration: May apply for disability if have a Stage IV cancer. (819) 317-5264 239-843-2336 ? [employer], Disability and Transit Services: Assists with nutrition, care and transit needs. 574-575-3985

## 2018-09-28 LAB — KAPPA/LAMBDA LIGHT CHAINS
Kappa free light chain: 3014 mg/L — ABNORMAL HIGH (ref 3.3–19.4)
Kappa, lambda light chain ratio: 287.05 — ABNORMAL HIGH (ref 0.26–1.65)
Lambda free light chains: 10.5 mg/L (ref 5.7–26.3)

## 2018-09-28 LAB — PROTEIN ELECTROPHORESIS, SERUM
A/G Ratio: 2.1 — ABNORMAL HIGH (ref 0.7–1.7)
Albumin ELP: 4.1 g/dL (ref 2.9–4.4)
Alpha-1-Globulin: 0.1 g/dL (ref 0.0–0.4)
Alpha-2-Globulin: 0.9 g/dL (ref 0.4–1.0)
Beta Globulin: 0.6 g/dL — ABNORMAL LOW (ref 0.7–1.3)
Gamma Globulin: 0.4 g/dL (ref 0.4–1.8)
Globulin, Total: 2 g/dL — ABNORMAL LOW (ref 2.2–3.9)
M-Spike, %: 0.1 g/dL — ABNORMAL HIGH
Total Protein ELP: 6.1 g/dL (ref 6.0–8.5)

## 2018-09-29 ENCOUNTER — Other Ambulatory Visit (HOSPITAL_COMMUNITY): Payer: Self-pay | Admitting: Hematology

## 2018-10-04 ENCOUNTER — Encounter (HOSPITAL_COMMUNITY): Payer: Self-pay | Admitting: Hematology

## 2018-10-04 ENCOUNTER — Inpatient Hospital Stay (HOSPITAL_COMMUNITY): Payer: Medicare Other | Attending: Hematology | Admitting: Hematology

## 2018-10-04 ENCOUNTER — Inpatient Hospital Stay (HOSPITAL_COMMUNITY): Payer: Medicare Other

## 2018-10-04 ENCOUNTER — Telehealth: Payer: Self-pay | Admitting: Gastroenterology

## 2018-10-04 ENCOUNTER — Other Ambulatory Visit: Payer: Self-pay

## 2018-10-04 VITALS — BP 136/83 | HR 73 | Resp 16

## 2018-10-04 VITALS — BP 112/56 | HR 82 | Temp 97.5°F | Resp 16 | Wt 167.2 lb

## 2018-10-04 DIAGNOSIS — E875 Hyperkalemia: Secondary | ICD-10-CM | POA: Diagnosis not present

## 2018-10-04 DIAGNOSIS — C9000 Multiple myeloma not having achieved remission: Secondary | ICD-10-CM

## 2018-10-04 DIAGNOSIS — N185 Chronic kidney disease, stage 5: Secondary | ICD-10-CM | POA: Insufficient documentation

## 2018-10-04 DIAGNOSIS — Z5112 Encounter for antineoplastic immunotherapy: Secondary | ICD-10-CM | POA: Diagnosis present

## 2018-10-04 DIAGNOSIS — Z5111 Encounter for antineoplastic chemotherapy: Secondary | ICD-10-CM | POA: Diagnosis present

## 2018-10-04 DIAGNOSIS — D631 Anemia in chronic kidney disease: Secondary | ICD-10-CM | POA: Insufficient documentation

## 2018-10-04 LAB — COMPREHENSIVE METABOLIC PANEL
ALT: 9 U/L (ref 0–44)
AST: 15 U/L (ref 15–41)
Albumin: 3.9 g/dL (ref 3.5–5.0)
Alkaline Phosphatase: 63 U/L (ref 38–126)
Anion gap: 12 (ref 5–15)
BUN: 59 mg/dL — ABNORMAL HIGH (ref 6–20)
CO2: 20 mmol/L — ABNORMAL LOW (ref 22–32)
Calcium: 7.2 mg/dL — ABNORMAL LOW (ref 8.9–10.3)
Chloride: 106 mmol/L (ref 98–111)
Creatinine, Ser: 4.31 mg/dL — ABNORMAL HIGH (ref 0.61–1.24)
GFR calc Af Amer: 16 mL/min — ABNORMAL LOW (ref >60)
GFR calc non Af Amer: 14 mL/min — ABNORMAL LOW (ref >60)
Glucose, Bld: 119 mg/dL — ABNORMAL HIGH (ref 70–99)
Potassium: 4.5 mmol/L (ref 3.5–5.1)
Sodium: 138 mmol/L (ref 135–145)
Total Bilirubin: 0.5 mg/dL (ref 0.3–1.2)
Total Protein: 6.6 g/dL (ref 6.5–8.1)

## 2018-10-04 LAB — CBC WITH DIFFERENTIAL/PLATELET
Abs Immature Granulocytes: 0.03 10*3/uL (ref 0.00–0.07)
Basophils Absolute: 0 10*3/uL (ref 0.0–0.1)
Basophils Relative: 1 %
Eosinophils Absolute: 0.2 10*3/uL (ref 0.0–0.5)
Eosinophils Relative: 5 %
HCT: 27.3 % — ABNORMAL LOW (ref 39.0–52.0)
Hemoglobin: 8.8 g/dL — ABNORMAL LOW (ref 13.0–17.0)
Immature Granulocytes: 1 %
Lymphocytes Relative: 13 %
Lymphs Abs: 0.5 10*3/uL — ABNORMAL LOW (ref 0.7–4.0)
MCH: 33.3 pg (ref 26.0–34.0)
MCHC: 32.2 g/dL (ref 30.0–36.0)
MCV: 103.4 fL — ABNORMAL HIGH (ref 80.0–100.0)
Monocytes Absolute: 0.7 10*3/uL (ref 0.1–1.0)
Monocytes Relative: 16 %
Neutro Abs: 2.7 10*3/uL (ref 1.7–7.7)
Neutrophils Relative %: 64 %
Platelets: 230 10*3/uL (ref 150–400)
RBC: 2.64 MIL/uL — ABNORMAL LOW (ref 4.22–5.81)
RDW: 18.9 % — ABNORMAL HIGH (ref 11.5–15.5)
WBC: 4.2 10*3/uL (ref 4.0–10.5)
nRBC: 0 % (ref 0.0–0.2)

## 2018-10-04 MED ORDER — PALONOSETRON HCL INJECTION 0.25 MG/5ML
0.2500 mg | Freq: Once | INTRAVENOUS | Status: AC
  Administered 2018-10-04: 0.25 mg via INTRAVENOUS
  Filled 2018-10-04: qty 5

## 2018-10-04 MED ORDER — SODIUM CHLORIDE 0.9% FLUSH
10.0000 mL | INTRAVENOUS | Status: DC | PRN
  Administered 2018-10-04: 10 mL
  Filled 2018-10-04: qty 10

## 2018-10-04 MED ORDER — EPOETIN ALFA-EPBX 10000 UNIT/ML IJ SOLN
20000.0000 [IU] | Freq: Once | INTRAMUSCULAR | Status: AC
  Administered 2018-10-04: 20000 [IU] via SUBCUTANEOUS
  Filled 2018-10-04: qty 2

## 2018-10-04 MED ORDER — SODIUM CHLORIDE 0.9 % IV SOLN
20.0000 mg | Freq: Once | INTRAVENOUS | Status: AC
  Administered 2018-10-04: 20 mg via INTRAVENOUS
  Filled 2018-10-04: qty 20

## 2018-10-04 MED ORDER — SODIUM CHLORIDE 0.9 % IV SOLN
Freq: Once | INTRAVENOUS | Status: AC
  Administered 2018-10-04: 10:00:00 via INTRAVENOUS

## 2018-10-04 MED ORDER — SODIUM CHLORIDE 0.9 % IV SOLN
300.0000 mg/m2 | Freq: Once | INTRAVENOUS | Status: AC
  Administered 2018-10-04: 600 mg via INTRAVENOUS
  Filled 2018-10-04: qty 30

## 2018-10-04 MED ORDER — BORTEZOMIB CHEMO SQ INJECTION 3.5 MG (2.5MG/ML)
1.3000 mg/m2 | Freq: Once | INTRAMUSCULAR | Status: AC
  Administered 2018-10-04: 2.5 mg via SUBCUTANEOUS
  Filled 2018-10-04: qty 1

## 2018-10-04 MED ORDER — EPOETIN ALFA-EPBX 10000 UNIT/ML IJ SOLN
INTRAMUSCULAR | Status: AC
  Filled 2018-10-04: qty 1

## 2018-10-04 MED ORDER — HEPARIN SOD (PORK) LOCK FLUSH 100 UNIT/ML IV SOLN
500.0000 [IU] | Freq: Once | INTRAVENOUS | Status: AC | PRN
  Administered 2018-10-04: 500 [IU]

## 2018-10-04 NOTE — Patient Instructions (Addendum)
[facility] at [facility] Discharge Instructions  You were seen today by Dr. Katragadda. He went over your recent lab results. He will see you back in 4 weeks for labs and follow up.   Thank you for choosing [facility] at [facility] to provide your oncology and hematology care.  To afford each patient quality time with our provider, please arrive at least 15 minutes before your scheduled appointment time.   If you have a lab appointment with the Cancer Center please come in thru the  Main Entrance and check in at the main information desk  You need to re-schedule your appointment should you arrive 10 or more minutes late.  We strive to give you quality time with our providers, and arriving late affects you and other patients whose appointments are after yours.  Also, if you no show three or more times for appointments you may be dismissed from the clinic at the providers discretion.     Again, thank you for choosing [facility].  Our hope is that these requests will decrease the amount of time that you wait before being seen by our physicians.       _____________________________________________________________  Should you have questions after your visit to [facility], please contact our office at [phone] between the hours of 8:00 a.m. and 4:30 p.m.  Voicemails left after 4:00 p.m. will not be returned until the following business day.  For prescription refill requests, have your pharmacy contact our office and allow 72 hours.    Cancer Center Support Programs:   > Cancer Support Group  2nd Tuesday of the month 1pm-2pm, Journey Room    

## 2018-10-04 NOTE — Telephone Encounter (Signed)
Can we get an update on rectal bleeding? He was seen virtually in the past and Anusol sent in. Is rectal bleeding continuing?

## 2018-10-04 NOTE — Progress Notes (Signed)
[name], [place] [address]   CLINIC:  Medical Oncology/Hematology  PCP:  Rosita Fire, MD [place] [address] [phone]   REASON FOR VISIT: Follow-up for Multiple myeloma    BRIEF ONCOLOGIC HISTORY:  Oncology History  Kappa light chain myeloma ([place])  06/16/2018 Initial Diagnosis   Kappa light chain myeloma ([place])   06/17/2018 -  Chemotherapy   The patient had palonosetron (ALOXI) injection 0.25 mg, 0.25 mg, Intravenous,  Once, 16 of 19 cycles Administration: 0.25 mg (06/17/2018), 0.25 mg (06/23/2018), 0.25 mg (06/30/2018), 0.25 mg (07/07/2018), 0.25 mg (07/14/2018), 0.25 mg (07/22/2018), 0.25 mg (07/29/2018), 0.25 mg (08/06/2018), 0.25 mg (08/13/2018), 0.25 mg (08/20/2018), 0.25 mg (08/27/2018), 0.25 mg (09/06/2018), 0.25 mg (09/13/2018), 0.25 mg (09/20/2018), 0.25 mg (09/27/2018), 0.25 mg (10/04/2018) bortezomib SQ (VELCADE) chemo injection 2.5 mg, 1.3 mg/m2 = 2.5 mg, Subcutaneous,  Once, 16 of 19 cycles Administration: 2.5 mg (06/17/2018), 2.5 mg (06/23/2018), 2.5 mg (06/30/2018), 2.5 mg (07/07/2018), 2.5 mg (07/14/2018), 2.5 mg (07/22/2018), 2.5 mg (07/29/2018), 2.5 mg (08/06/2018), 2.5 mg (08/13/2018), 2.5 mg (08/20/2018), 2.5 mg (08/27/2018), 2.5 mg (09/06/2018), 2.5 mg (09/13/2018), 2.5 mg (09/20/2018), 2.5 mg (09/27/2018), 2.5 mg (10/04/2018) cyclophosphamide (CYTOXAN) 300 mg in sodium chloride 0.9 % 250 mL chemo infusion, 300 mg (100 % of original dose 300 mg), Intravenous,  Once, 16 of 19 cycles Dose modification: 300 mg (original dose 300 mg, Cycle 1, Reason: Change in SCr/CrCl), 500 mg (original dose 500 mg, Cycle 2, Reason: Provider Judgment), 500 mg (original dose 500 mg, Cycle 3, Reason: Change in SCr/CrCl) Administration: 300 mg (06/17/2018), 500 mg (06/23/2018), 500 mg (06/30/2018), 600 mg (07/07/2018), 600 mg (07/14/2018), 600 mg (07/22/2018), 600 mg (07/29/2018), 600 mg (08/06/2018), 600 mg (08/13/2018), 600 mg (08/20/2018), 600 mg (08/27/2018), 600 mg  (09/06/2018), 600 mg (09/13/2018), 600 mg (09/20/2018), 600 mg (09/27/2018), 600 mg (10/04/2018)  for chemotherapy treatment.       INTERVAL HISTORY:  [name] 58 y.o. male seen for follow-up of multiple myeloma.  Leg swellings are much better.  He is taking torsemide 40 mg daily.  Denies any worsening of minor numbness in the hands and feet.  Appetite is 75%.  Energy levels are 75%.  He is also taking Veltassa for hyperkalemia.  He is tolerating Procrit very well.  Denies any new onset pains.  Dilaudid 4 mg every 8 hours is helping his back pain and right shoulder pain.  Denies any ER visits or hospitalizations.     REVIEW OF SYSTEMS:  Review of Systems  Musculoskeletal: Positive for back pain.  Neurological: Positive for numbness.  Psychiatric/Behavioral: Positive for sleep disturbance.  All other systems reviewed and are negative.    PAST MEDICAL/SURGICAL HISTORY:  Past Medical History:  Diagnosis Date  . Allergy   . Arthritis    neck and back  . Blood transfusion without reported diagnosis   . BPH (benign prostatic hyperplasia)   . Cancer [facility]) 2004   testicle  . Chronic kidney disease    kidney stones  . Left lumbar radiculopathy 06/12/2016  . 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 Medical history non-contributory    Pt has scattered thoughts and uncertain of past medical history  . 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 Stones, urinary tract   . Substance abuse ([place])    prescribed oxydcodone- 10 years   Past Surgical History:  Procedure Laterality Date  . BACK SURGERY     x5  . CERVICAL DISC SURGERY  x2  . COLONOSCOPY WITH PROPOFOL N/A 08/11/2016   Dr. Gala Romney: non-bleeding internal hemorrhoids, one 4 mm hyperplastic rectal polyp, diverticulosis in entire examined colon  . POLYPECTOMY  08/11/2016   Procedure: POLYPECTOMY;  Surgeon: [name], MD;  Location: [facility] ENDO SUITE;  Service: Endoscopy;;  colon  . PORTACATH PLACEMENT Left 09/20/2018   Procedure: INSERTION  PORT-A-CATH (catheter attached left subclavian);  Surgeon: Virl Cagey, MD;  Location: [facility] ORS;  Service: General;  Laterality: Left;  . testicular cancer  2004  . TONSILLECTOMY       SOCIAL HISTORY:  Social History   Socioeconomic History  . Marital status: Single    Spouse name: Not on file  . Number of children: 1  . Years of education: 63  . Highest education level: Not on file  Occupational History  . Occupation: retired    Comment: Psychologist, prison and probation services  . Occupation: disabled  Social Needs  . Financial resource strain: Not on file  . Food insecurity    Worry: Not on file    Inability: Not on file  . Transportation needs    Medical: Not on file    Non-medical: Not on file  Tobacco Use  . Smoking status: Current Every Day Smoker    Packs/day: 1.00    Years: 40.00    Pack years: 40.00    Types: Cigarettes    Start date: 03/31/1974  . Smokeless tobacco: Never Used  Substance and Sexual Activity  . Alcohol use: Yes    Comment: Occasional  . Drug use: No    Types: Cocaine    Comment: Remote hx of cocaine, quit 1989  . Sexual activity: Yes  Lifestyle  . Physical activity    Days per week: Not on file    Minutes per session: Not on file  . Stress: Not on file  Relationships  . Social Herbalist on phone: Not on file    Gets together: Not on file    Attends religious service: Not on file    Active member of club or organization: Not on file    Attends meetings of clubs or organizations: Not on file    Relationship status: Not on file  . Intimate partner violence    Fear of current or ex partner: Not on file    Emotionally abused: Not on file    Physically abused: Not on file    Forced sexual activity: Not on file  Other Topics Concern  . Not on file  Social History Narrative   [employer] for 12 years   [employer] for 43 years      Never married   One daughter      Lives alone   Retail banker   Right-handed   Occasional caffeine  use          FAMILY HISTORY:  Family History  Problem Relation Age of Onset  . COPD Mother   . Heart disease Mother   . Other Father        Never knew his father.  . Thyroid disease Sister   . Arthritis Sister   . Heart disease Sister        bypass  . Colon cancer Neg Hx     CURRENT MEDICATIONS:  Outpatient Encounter Medications as of 10/04/2018  Medication Sig  . bortezomib IV (VELCADE) 3.5 MG injection Inject 1.3 mg/m2 into the vein once a week.   . calcitRIOL (ROCALTROL) 0.25  MCG capsule Take 1 capsule by mouth daily.  . CYCLOPHOSPHAMIDE IV Inject into the vein once a week.  . diphenhydrAMINE (BENADRYL) 25 MG tablet Take 1 tablet (25 mg total) by mouth as needed for itching. (Patient not taking: Reported on 09/10/2018)  . Epoetin Alfa (PROCRIT IJ) Inject as directed. Unsure of dosage- gets this once weekly  . HYDROmorphone (DILAUDID) 4 MG tablet Take 1 tablet (4 mg total) by mouth every 8 (eight) hours as needed for severe pain.  . Misc. Devices MISC Please provide patient with rollaider.  Dx: multiple myeloma C90.0  . Patiromer Sorbitex Calcium (VELTASSA PO) Take by mouth 2 (two) times a week.   . senna (SENOKOT) 8.6 MG tablet Take 1 tablet by mouth 2 (two) times daily.   . sodium bicarbonate 650 MG tablet Take 1,300 mg by mouth 2 (two) times daily.  Mar[place] Kitchen torsemide (DEMADEX) 20 MG tablet TAKING 1 TABLET DAILY, AND 2 TABLETS ONLY IF HIS ANKLES ARE REALLY SWOLLEN  . valACYclovir (VALTREX) 500 MG tablet Take 1 tablet (500 mg total) by mouth 2 (two) times daily.  . [DISCONTINUED] protein supplement (RESOURCE BENEPROTEIN) POWD Take 1 Scoop by mouth 3 (three) times daily with meals.   No facility-administered encounter medications on file as of 10/04/2018.     ALLERGIES:  Allergies  Allergen Reactions  . Oxycodone-Acetaminophen   . Acetaminophen Nausea Only and Other (See Comments)    Elevated liver enzymes  . Cymbalta [Duloxetine Hcl] Swelling    Facial swelling  . Diclofenac  Sodium Swelling       . Imodium [Loperamide] Swelling    facial  . Lyrica [Pregabalin] Swelling  . Morphine Other (See Comments)    Bradycardia   . Vioxx [Rofecoxib] Swelling  . Aleve [Naproxen] Swelling    eye     PHYSICAL EXAM:  ECOG Performance status: 1  Vitals:   10/04/18 0847  BP: (!) 112/56  Pulse: 82  Resp: 16  Temp: (!) 97.5 F (36.4 C)  SpO2: 100%   Filed Weights   10/04/18 0847  Weight: 167 lb 3.2 oz (75.8 kg)    Physical Exam Vitals signs reviewed.  Constitutional:      Appearance: Normal appearance. He is normal weight.  Cardiovascular:     Rate and Rhythm: Normal rate and regular rhythm.     Heart sounds: Normal heart sounds.  Pulmonary:     Effort: Pulmonary effort is normal.     Breath sounds: Normal breath sounds.  Abdominal:     General: Bowel sounds are normal.     Palpations: Abdomen is soft.  Musculoskeletal: Normal range of motion.     Right lower leg: Edema present.     Left lower leg: Edema present.  Skin:    General: Skin is warm and dry.  Neurological:     Mental Status: He is alert and oriented to person, place, and time. Mental status is at baseline.  Psychiatric:        Mood and Affect: Mood normal.        Behavior: Behavior normal.      LABORATORY DATA:  I have reviewed the labs as listed.  CBC    Component Value Date/Time   WBC 4.2 10/04/2018 0904   RBC 2.64 (L) 10/04/2018 0904   HGB 8.8 (L) 10/04/2018 0904   HCT 27.3 (L) 10/04/2018 0904   PLT 230 10/04/2018 0904   MCV 103.4 (H) 10/04/2018 0904   MCH 33.3 10/04/2018 0904   MCHC 32.2  10/04/2018 0904   RDW 18.9 (H) 10/04/2018 0904   LYMPHSABS 0.5 (L) 10/04/2018 0904   MONOABS 0.7 10/04/2018 0904   EOSABS 0.2 10/04/2018 0904   BASOSABS 0.0 10/04/2018 0904   CMP Latest Ref Rng & Units 10/04/2018 09/27/2018 09/20/2018  Glucose 70 - 99 mg/dL 119(H) 123(H) 84  BUN 6 - 20 mg/dL 59(H) 49(H) 49(H)  Creatinine 0.61 - 1.24 mg/dL 4.31(H) 4.33(H) 4.56(H)  Sodium 135 - 145  mmol/L 138 138 138  Potassium 3.5 - 5.1 mmol/L 4.5 4.6 5.0  Chloride 98 - 111 mmol/L 106 104 107  CO2 22 - 32 mmol/L 20(L) 19(L) 20(L)  Calcium 8.9 - 10.3 mg/dL 7.2(L) 7.7(L) 7.8(L)  Total Protein 6.5 - 8.1 g/dL 6.6 6.7 6.6  Total Bilirubin 0.3 - 1.2 mg/dL 0.5 0.4 0.5  Alkaline Phos 38 - 126 U/L 63 60 58  AST 15 - 41 U/L 15 14(L) 11(L)  ALT 0 - 44 U/L _0 I have personally reviewed his scans and discussed with him.     ASSESSMENT & PLAN:   Kappa light chain myeloma ([place]) 1.  Kappa light chain myeloma: - Presentation with acute renal failure and hypercalcemia. -M spike of 0.4 g.  Serum immunofixation did not show any immunoglobulin heavy chain.  Beta-2 microglobulin of 32.8.  Kappa free light chains of 12,085 and free light chain ratio of 1981.  LDH of 224. - Started on weekly CyBorD on 06/17/2018. 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  Cytogenetic analysis shows complex chromosome aberrations.  Gain of 1 q. was seen.  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 Joelyn Oms of nephrology. - Retacrit 20,000 units weekly was started on 07/22/2018. -Multiple myeloma labs show continued response.  Labs from 09/27/2018 shows M spike of 0.1 g.  Free light chain ratio has decreased to 287 from 647.  Kappa light chains have come down to 3014.  Creatinine is also better at 4.31. -He may proceed with Velcade and cyclophosphamide.  We will reevaluate him in 4 weeks.  2.  Hyperkalemia: -He is taking Veltassa regularly.  Potassium and magnesium are normal.  3.  Macrocytic anemia: --he is receiving Procrit 20,000 units weekly.  Hemoglobin is stable between 8 and 9.  4.  ID prophylaxis: -He had a rash with acyclovir.  We will start him on Valtrex 500 twice daily.   4.  Low back pain: -He had rash and itching with oxycodone and hydrocodone. -He is taking Dilaudid 4 mg every 8 hours, controlling well.  5.  Lower extremity  edema: -He is taking torsemide 40 mg daily.        Total time spent is 25 minutes with more than 50% of the time spent face-to-face discussing treatment plan and coordination of care.    Orders placed this encounter:  Orders Placed This Encounter  Procedures  . CBC with Differential/Platelet  . Comprehensive metabolic panel  . Protein electrophoresis, serum  . Kappa/lambda light chains  . Lactate dehydrogenase      Derek Jack, MD [place] [phone]

## 2018-10-04 NOTE — Progress Notes (Signed)
Labs reviewed with MD today. Proceed with treatment per MD.   Procrit given per orders. See MAR  Treatment given per orders. Patient tolerated it well without problems. Vitals stable and discharged home from clinic ambulatory. Follow up as scheduled.

## 2018-10-04 NOTE — Telephone Encounter (Signed)
Pt spoke with his cancer doctor about his diet. The cancer doctor wanted him to cut out the red meat  That he was eating. Pt d/c beef ect and hasn't seen any blood. Pt d/c the benefiber due to having several times a day and his doctor put him on Veltassa due to his potassium that was leaving him. Pt states that since he cut out the beef/red meat, he hasn't seen any problems.

## 2018-10-04 NOTE — Assessment & Plan Note (Addendum)
1.  Kappa light chain myeloma: - Presentation with acute renal failure and hypercalcemia. -M spike of 0.4 g.  Serum immunofixation did not show any immunoglobulin heavy chain.  Beta-2 microglobulin of 32.8.  Kappa free light chains of 12,085 and free light chain ratio of 1981.  LDH of 224. - Started on weekly CyBorD on 06/17/2018. 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  Cytogenetic analysis shows complex chromosome aberrations.  Gain of 1 q. was seen.  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 Joelyn Oms of nephrology. - Retacrit 20,000 units weekly was started on 07/22/2018. -Multiple myeloma labs show continued response.  Labs from 09/27/2018 shows M spike of 0.1 g.  Free light chain ratio has decreased to 287 from 647.  Kappa light chains have come down to 3014.  Creatinine is also better at 4.31. -He may proceed with Velcade and cyclophosphamide.  We will reevaluate him in 4 weeks.  2.  Hyperkalemia: -He is taking Veltassa regularly.  Potassium and magnesium are normal.  3.  Macrocytic anemia: --he is receiving Procrit 20,000 units weekly.  Hemoglobin is stable between 8 and 9.  4.  ID prophylaxis: -He had a rash with acyclovir.  We will start him on Valtrex 500 twice daily.   4.  Low back pain: -He had rash and itching with oxycodone and hydrocodone. -He is taking Dilaudid 4 mg every 8 hours, controlling well.  5.  Lower extremity edema: -He is taking torsemide 40 mg daily.

## 2018-10-04 NOTE — Progress Notes (Signed)
Correction Reticrit given per orders. No procrit.

## 2018-10-04 NOTE — Patient Instructions (Signed)
[facility] at [facility]  Discharge Instructions:   _______________________________________________________________  Thank you for choosing [facility] at [facility] to provide your oncology and hematology care.  To afford each patient quality time with our providers, please arrive at least 15 minutes before your scheduled appointment.  You need to re-schedule your appointment if you arrive 10 or more minutes late.  We strive to give you quality time with our providers, and arriving late affects you and other patients whose appointments are after yours.  Also, if you no show three or more times for appointments you may be dismissed from the clinic.  Again, thank you for choosing [facility] at [facility]. Our hope is that these requests will allow you access to exceptional care and in a timely manner. _______________________________________________________________  If you have questions after your visit, please contact our office at [phone] between the hours of 8:30 a.m. and 5:00 p.m. Voicemails left after 4:30 p.m. will not be returned until the following business day. _______________________________________________________________  For prescription refill requests, have your pharmacy contact our office. _______________________________________________________________  Recommendations made by the consultant and any test results will be sent to your referring physician. _______________________________________________________________ 

## 2018-10-05 NOTE — Telephone Encounter (Signed)
Glad to hear. [name]: please arrange routine follow-up in next 3-4 months.

## 2018-10-06 ENCOUNTER — Other Ambulatory Visit (HOSPITAL_COMMUNITY): Payer: Self-pay | Admitting: Nurse Practitioner

## 2018-10-06 ENCOUNTER — Encounter: Payer: Self-pay | Admitting: Internal Medicine

## 2018-10-06 NOTE — Telephone Encounter (Signed)
Patient scheduled and letter sent  °

## 2018-10-11 ENCOUNTER — Inpatient Hospital Stay (HOSPITAL_COMMUNITY): Payer: Medicare Other

## 2018-10-11 ENCOUNTER — Other Ambulatory Visit: Payer: Self-pay

## 2018-10-11 VITALS — BP 115/65 | HR 77 | Temp 97.3°F | Resp 16 | Wt 164.6 lb

## 2018-10-11 DIAGNOSIS — E875 Hyperkalemia: Secondary | ICD-10-CM

## 2018-10-11 DIAGNOSIS — Z5112 Encounter for antineoplastic immunotherapy: Secondary | ICD-10-CM | POA: Diagnosis not present

## 2018-10-11 DIAGNOSIS — C9000 Multiple myeloma not having achieved remission: Secondary | ICD-10-CM

## 2018-10-11 DIAGNOSIS — N185 Chronic kidney disease, stage 5: Secondary | ICD-10-CM

## 2018-10-11 LAB — CBC WITH DIFFERENTIAL/PLATELET
Abs Immature Granulocytes: 0.02 10*3/uL (ref 0.00–0.07)
Basophils Absolute: 0 10*3/uL (ref 0.0–0.1)
Basophils Relative: 0 %
Eosinophils Absolute: 0.2 10*3/uL (ref 0.0–0.5)
Eosinophils Relative: 5 %
HCT: 31.3 % — ABNORMAL LOW (ref 39.0–52.0)
Hemoglobin: 10.1 g/dL — ABNORMAL LOW (ref 13.0–17.0)
Immature Granulocytes: 1 %
Lymphocytes Relative: 12 %
Lymphs Abs: 0.5 10*3/uL — ABNORMAL LOW (ref 0.7–4.0)
MCH: 33.3 pg (ref 26.0–34.0)
MCHC: 32.3 g/dL (ref 30.0–36.0)
MCV: 103.3 fL — ABNORMAL HIGH (ref 80.0–100.0)
Monocytes Absolute: 0.9 10*3/uL (ref 0.1–1.0)
Monocytes Relative: 22 %
Neutro Abs: 2.5 10*3/uL (ref 1.7–7.7)
Neutrophils Relative %: 60 %
Platelets: 253 10*3/uL (ref 150–400)
RBC: 3.03 MIL/uL — ABNORMAL LOW (ref 4.22–5.81)
RDW: 18.4 % — ABNORMAL HIGH (ref 11.5–15.5)
WBC: 4.1 10*3/uL (ref 4.0–10.5)
nRBC: 0 % (ref 0.0–0.2)

## 2018-10-11 LAB — COMPREHENSIVE METABOLIC PANEL
ALT: 10 U/L (ref 0–44)
AST: 16 U/L (ref 15–41)
Albumin: 4.1 g/dL (ref 3.5–5.0)
Alkaline Phosphatase: 68 U/L (ref 38–126)
Anion gap: 12 (ref 5–15)
BUN: 53 mg/dL — ABNORMAL HIGH (ref 6–20)
CO2: 20 mmol/L — ABNORMAL LOW (ref 22–32)
Calcium: 8 mg/dL — ABNORMAL LOW (ref 8.9–10.3)
Chloride: 107 mmol/L (ref 98–111)
Creatinine, Ser: 4.53 mg/dL — ABNORMAL HIGH (ref 0.61–1.24)
GFR calc Af Amer: 15 mL/min — ABNORMAL LOW (ref >60)
GFR calc non Af Amer: 13 mL/min — ABNORMAL LOW (ref >60)
Glucose, Bld: 95 mg/dL (ref 70–99)
Potassium: 5.7 mmol/L — ABNORMAL HIGH (ref 3.5–5.1)
Sodium: 139 mmol/L (ref 135–145)
Total Bilirubin: 0.4 mg/dL (ref 0.3–1.2)
Total Protein: 6.6 g/dL (ref 6.5–8.1)

## 2018-10-11 LAB — MAGNESIUM: Magnesium: 2.3 mg/dL (ref 1.7–2.4)

## 2018-10-11 MED ORDER — PALONOSETRON HCL INJECTION 0.25 MG/5ML
0.2500 mg | Freq: Once | INTRAVENOUS | Status: AC
  Administered 2018-10-11: 0.25 mg via INTRAVENOUS
  Filled 2018-10-11: qty 5

## 2018-10-11 MED ORDER — SODIUM CHLORIDE 0.9% FLUSH
10.0000 mL | INTRAVENOUS | Status: DC | PRN
  Administered 2018-10-11: 10 mL
  Filled 2018-10-11: qty 10

## 2018-10-11 MED ORDER — HEPARIN SOD (PORK) LOCK FLUSH 100 UNIT/ML IV SOLN
500.0000 [IU] | Freq: Once | INTRAVENOUS | Status: AC | PRN
  Administered 2018-10-11: 500 [IU]

## 2018-10-11 MED ORDER — DEXTROSE 50 % IV SOLN
50.0000 mL | Freq: Once | INTRAVENOUS | Status: AC
  Administered 2018-10-11: 50 mL via INTRAVENOUS
  Filled 2018-10-11: qty 50

## 2018-10-11 MED ORDER — EPOETIN ALFA-EPBX 10000 UNIT/ML IJ SOLN
20000.0000 [IU] | Freq: Once | INTRAMUSCULAR | Status: AC
  Administered 2018-10-11: 20000 [IU] via SUBCUTANEOUS
  Filled 2018-10-11: qty 2

## 2018-10-11 MED ORDER — BORTEZOMIB CHEMO SQ INJECTION 3.5 MG (2.5MG/ML)
1.3000 mg/m2 | Freq: Once | INTRAMUSCULAR | Status: AC
  Administered 2018-10-11: 2.5 mg via SUBCUTANEOUS
  Filled 2018-10-11: qty 1

## 2018-10-11 MED ORDER — SODIUM CHLORIDE 0.9 % IV SOLN
300.0000 mg/m2 | Freq: Once | INTRAVENOUS | Status: AC
  Administered 2018-10-11: 600 mg via INTRAVENOUS
  Filled 2018-10-11: qty 30

## 2018-10-11 MED ORDER — SODIUM CHLORIDE 0.9 % IV SOLN
20.0000 mg | Freq: Once | INTRAVENOUS | Status: AC
  Administered 2018-10-11: 20 mg via INTRAVENOUS
  Filled 2018-10-11: qty 20

## 2018-10-11 MED ORDER — SODIUM CHLORIDE 0.9 % IV SOLN
Freq: Once | INTRAVENOUS | Status: AC
  Administered 2018-10-11: 10:00:00 via INTRAVENOUS

## 2018-10-11 MED ORDER — INSULIN REGULAR HUMAN 100 UNIT/ML IJ SOLN
10.0000 [IU] | Freq: Once | INTRAMUSCULAR | Status: AC
  Administered 2018-10-11: 10 [IU] via INTRAVENOUS
  Filled 2018-10-11: qty 10

## 2018-10-11 NOTE — Patient Instructions (Signed)
[facility] Discharge Instructions for Patients Receiving Chemotherapy  Today you received the following chemotherapy agents   To help prevent nausea and vomiting after your treatment, we encourage you to take your nausea medication   If you develop nausea and vomiting that is not controlled by your nausea medication, call the clinic.   BELOW ARE SYMPTOMS THAT SHOULD BE REPORTED IMMEDIATELY:  *FEVER GREATER THAN 100.5 F  *CHILLS WITH OR WITHOUT FEVER  NAUSEA AND VOMITING THAT IS NOT CONTROLLED WITH YOUR NAUSEA MEDICATION  *UNUSUAL SHORTNESS OF BREATH  *UNUSUAL BRUISING OR BLEEDING  TENDERNESS IN MOUTH AND THROAT WITH OR WITHOUT PRESENCE OF ULCERS  *URINARY PROBLEMS  *BOWEL PROBLEMS  UNUSUAL RASH Items with * indicate a potential emergency and should be followed up as soon as possible.  Feel free to call the clinic should you have any questions or concerns. The clinic phone number is [phone].  Please show the CHEMO ALERT CARD at check-in to the Emergency Department and triage nurse.   

## 2018-10-11 NOTE — Progress Notes (Signed)
10/11/18  Potassium level = 5.7  Received order to give Regular Insulin 10 units IV + Dextrose 50% IV x 1 amp (55mL).  Orders entered.  T.O. Dr Katragadda/Catherine Page RN/Yessenia Maillet Ronnald Ramp, PharmD

## 2018-10-11 NOTE — Progress Notes (Signed)
Labs reviewed with MD today. Will give additional medications per MD. And ok to proceed with treatment as well. Potassium, BUN, Creatinine noted by MD also.   Treatment given per orders. Patient tolerated it well without problems. Vitals stable and discharged home from clinic ambulatory. Follow up as scheduled.

## 2018-10-12 MED ORDER — LANREOTIDE ACETATE 120 MG/0.5ML ~~LOC~~ SOLN
SUBCUTANEOUS | Status: AC
  Filled 2018-10-12: qty 120

## 2018-10-13 ENCOUNTER — Other Ambulatory Visit (HOSPITAL_COMMUNITY): Payer: Self-pay | Admitting: Hematology

## 2018-10-18 ENCOUNTER — Encounter (HOSPITAL_COMMUNITY): Payer: Self-pay

## 2018-10-18 ENCOUNTER — Inpatient Hospital Stay (HOSPITAL_COMMUNITY): Payer: Medicare Other

## 2018-10-18 ENCOUNTER — Other Ambulatory Visit: Payer: Self-pay

## 2018-10-18 VITALS — BP 124/58 | HR 79 | Temp 98.5°F | Resp 18 | Wt 166.4 lb

## 2018-10-18 DIAGNOSIS — C9000 Multiple myeloma not having achieved remission: Secondary | ICD-10-CM

## 2018-10-18 DIAGNOSIS — Z5112 Encounter for antineoplastic immunotherapy: Secondary | ICD-10-CM | POA: Diagnosis not present

## 2018-10-18 LAB — MAGNESIUM: Magnesium: 1.9 mg/dL (ref 1.7–2.4)

## 2018-10-18 LAB — COMPREHENSIVE METABOLIC PANEL
ALT: 13 U/L (ref 0–44)
AST: 22 U/L (ref 15–41)
Albumin: 4.2 g/dL (ref 3.5–5.0)
Alkaline Phosphatase: 79 U/L (ref 38–126)
Anion gap: 10 (ref 5–15)
BUN: 56 mg/dL — ABNORMAL HIGH (ref 6–20)
CO2: 20 mmol/L — ABNORMAL LOW (ref 22–32)
Calcium: 8 mg/dL — ABNORMAL LOW (ref 8.9–10.3)
Chloride: 107 mmol/L (ref 98–111)
Creatinine, Ser: 4.35 mg/dL — ABNORMAL HIGH (ref 0.61–1.24)
GFR calc Af Amer: 16 mL/min — ABNORMAL LOW (ref >60)
GFR calc non Af Amer: 14 mL/min — ABNORMAL LOW (ref >60)
Glucose, Bld: 109 mg/dL — ABNORMAL HIGH (ref 70–99)
Potassium: 5 mmol/L (ref 3.5–5.1)
Sodium: 137 mmol/L (ref 135–145)
Total Bilirubin: 0.6 mg/dL (ref 0.3–1.2)
Total Protein: 6.7 g/dL (ref 6.5–8.1)

## 2018-10-18 LAB — CBC WITH DIFFERENTIAL/PLATELET
Abs Immature Granulocytes: 0.03 10*3/uL (ref 0.00–0.07)
Basophils Absolute: 0 10*3/uL (ref 0.0–0.1)
Basophils Relative: 0 %
Eosinophils Absolute: 0.3 10*3/uL (ref 0.0–0.5)
Eosinophils Relative: 7 %
HCT: 32.5 % — ABNORMAL LOW (ref 39.0–52.0)
Hemoglobin: 10.6 g/dL — ABNORMAL LOW (ref 13.0–17.0)
Immature Granulocytes: 1 %
Lymphocytes Relative: 15 %
Lymphs Abs: 0.6 10*3/uL — ABNORMAL LOW (ref 0.7–4.0)
MCH: 33.3 pg (ref 26.0–34.0)
MCHC: 32.6 g/dL (ref 30.0–36.0)
MCV: 102.2 fL — ABNORMAL HIGH (ref 80.0–100.0)
Monocytes Absolute: 0.7 10*3/uL (ref 0.1–1.0)
Monocytes Relative: 18 %
Neutro Abs: 2.4 10*3/uL (ref 1.7–7.7)
Neutrophils Relative %: 59 %
Platelets: 214 10*3/uL (ref 150–400)
RBC: 3.18 MIL/uL — ABNORMAL LOW (ref 4.22–5.81)
RDW: 18.6 % — ABNORMAL HIGH (ref 11.5–15.5)
WBC: 4 10*3/uL (ref 4.0–10.5)
nRBC: 0 % (ref 0.0–0.2)

## 2018-10-18 MED ORDER — SODIUM CHLORIDE 0.9 % IV SOLN
20.0000 mg | Freq: Once | INTRAVENOUS | Status: AC
  Administered 2018-10-18: 20 mg via INTRAVENOUS
  Filled 2018-10-18: qty 20

## 2018-10-18 MED ORDER — SODIUM CHLORIDE 0.9 % IV SOLN
Freq: Once | INTRAVENOUS | Status: AC
  Administered 2018-10-18: 11:00:00 via INTRAVENOUS

## 2018-10-18 MED ORDER — SODIUM CHLORIDE 0.9% FLUSH
10.0000 mL | INTRAVENOUS | Status: DC | PRN
  Administered 2018-10-18: 10 mL
  Filled 2018-10-18: qty 10

## 2018-10-18 MED ORDER — PALONOSETRON HCL INJECTION 0.25 MG/5ML
0.2500 mg | Freq: Once | INTRAVENOUS | Status: AC
  Administered 2018-10-18: 0.25 mg via INTRAVENOUS
  Filled 2018-10-18: qty 5

## 2018-10-18 MED ORDER — HEPARIN SOD (PORK) LOCK FLUSH 100 UNIT/ML IV SOLN
500.0000 [IU] | Freq: Once | INTRAVENOUS | Status: AC | PRN
  Administered 2018-10-18: 500 [IU]

## 2018-10-18 MED ORDER — SODIUM CHLORIDE 0.9 % IV SOLN
300.0000 mg/m2 | Freq: Once | INTRAVENOUS | Status: AC
  Administered 2018-10-18: 600 mg via INTRAVENOUS
  Filled 2018-10-18: qty 30

## 2018-10-18 MED ORDER — BORTEZOMIB CHEMO SQ INJECTION 3.5 MG (2.5MG/ML)
1.3000 mg/m2 | Freq: Once | INTRAMUSCULAR | Status: AC
  Administered 2018-10-18: 2.5 mg via SUBCUTANEOUS
  Filled 2018-10-18: qty 1

## 2018-10-18 NOTE — Progress Notes (Signed)
10/18/18  Hold Retacrit today with Hgb 10.9 per chemotherapy parameters.   Henreitta Leber, PharmD

## 2018-10-18 NOTE — Patient Instructions (Addendum)
[facility] Discharge Instructions for Patients Receiving Chemotherapy   Beginning January 23rd 2017 lab work for the [facility] will be done in the  Main lab at [facility] on 1st floor. If you have a lab appointment with the [facility] please come in thru the  Main Entrance and check in at the main information desk   Today you received the following chemotherapy agents Cytoxan and Velcade injection. Follow-up as scheduled. Call clinic for any questions or concerns  To help prevent nausea and vomiting after your treatment, we encourage you to take your nausea medication    If you develop nausea and vomiting, or diarrhea that is not controlled by your medication, call the clinic.  The clinic phone number is [phone]. Office hours are Monday-Friday 8:30am-5:00pm.  BELOW ARE SYMPTOMS THAT SHOULD BE REPORTED IMMEDIATELY:  *FEVER GREATER THAN 101.0 F  *CHILLS WITH OR WITHOUT FEVER  NAUSEA AND VOMITING THAT IS NOT CONTROLLED WITH YOUR NAUSEA MEDICATION  *UNUSUAL SHORTNESS OF BREATH  *UNUSUAL BRUISING OR BLEEDING  TENDERNESS IN MOUTH AND THROAT WITH OR WITHOUT PRESENCE OF ULCERS  *URINARY PROBLEMS  *BOWEL PROBLEMS  UNUSUAL RASH Items with * indicate a potential emergency and should be followed up as soon as possible. If you have an emergency after office hours please contact your primary care physician or go to the nearest emergency department.  Please call the clinic during office hours if you have any questions or concerns.   You may also contact the Patient Navigator at [phone] should you have any questions or need assistance in obtaining follow up care.      Resources For Cancer Patients and their Caregivers ? American Cancer Society: Can assist with transportation, wigs, general needs, runs Look Good Feel Better.        574-192-7779 ? Cancer Care: Provides financial assistance, online support groups, medication/co-pay assistance.   1-800-813-HOPE 609-147-2858) ? [facility] Assists [place] cancer patients and their families through emotional , educational and financial support.  (773) 525-5220 ? [place] DSS Where to apply for food stamps, Medicaid and utility assistance. 918-289-5251 ? [employer]: Transportation to medical appointments. 805-175-1060 ? Social Security Administration: May apply for disability if have a Stage IV cancer. 709-233-5969 801-391-3598 ? [employer], Disability and Transit Services: Assists with nutrition, care and transit needs. 9391243985

## 2018-10-18 NOTE — Progress Notes (Signed)
[place] reviewed with Dr. Delton Coombes and pt approved for chemo tx today per MD                                [name] tolerated chemo tx well without complaints or incident.Hgb 10.6 today so Retacrit injection held per C.Jones Pharmacist. VSS upon discharge. Pt discharged self ambulatory in satisfactory condition

## 2018-10-25 ENCOUNTER — Inpatient Hospital Stay (HOSPITAL_COMMUNITY): Payer: Medicare Other

## 2018-10-25 ENCOUNTER — Other Ambulatory Visit: Payer: Self-pay

## 2018-10-25 ENCOUNTER — Encounter (HOSPITAL_COMMUNITY): Payer: Self-pay

## 2018-10-25 VITALS — BP 106/64 | HR 68 | Temp 97.5°F | Resp 18 | Wt 171.8 lb

## 2018-10-25 DIAGNOSIS — C9000 Multiple myeloma not having achieved remission: Secondary | ICD-10-CM

## 2018-10-25 DIAGNOSIS — N185 Chronic kidney disease, stage 5: Secondary | ICD-10-CM

## 2018-10-25 DIAGNOSIS — Z5112 Encounter for antineoplastic immunotherapy: Secondary | ICD-10-CM | POA: Diagnosis not present

## 2018-10-25 LAB — CBC WITH DIFFERENTIAL/PLATELET
Abs Immature Granulocytes: 0.01 10*3/uL (ref 0.00–0.07)
Basophils Absolute: 0.1 10*3/uL (ref 0.0–0.1)
Basophils Relative: 1 %
Eosinophils Absolute: 0.3 10*3/uL (ref 0.0–0.5)
Eosinophils Relative: 6 %
HCT: 30.3 % — ABNORMAL LOW (ref 39.0–52.0)
Hemoglobin: 9.6 g/dL — ABNORMAL LOW (ref 13.0–17.0)
Immature Granulocytes: 0 %
Lymphocytes Relative: 14 %
Lymphs Abs: 0.6 10*3/uL — ABNORMAL LOW (ref 0.7–4.0)
MCH: 32.5 pg (ref 26.0–34.0)
MCHC: 31.7 g/dL (ref 30.0–36.0)
MCV: 102.7 fL — ABNORMAL HIGH (ref 80.0–100.0)
Monocytes Absolute: 0.9 10*3/uL (ref 0.1–1.0)
Monocytes Relative: 20 %
Neutro Abs: 2.4 10*3/uL (ref 1.7–7.7)
Neutrophils Relative %: 59 %
Platelets: 168 10*3/uL (ref 150–400)
RBC: 2.95 MIL/uL — ABNORMAL LOW (ref 4.22–5.81)
RDW: 18.1 % — ABNORMAL HIGH (ref 11.5–15.5)
WBC: 4.2 10*3/uL (ref 4.0–10.5)
nRBC: 0 % (ref 0.0–0.2)

## 2018-10-25 LAB — COMPREHENSIVE METABOLIC PANEL
ALT: 12 U/L (ref 0–44)
AST: 18 U/L (ref 15–41)
Albumin: 4 g/dL (ref 3.5–5.0)
Alkaline Phosphatase: 73 U/L (ref 38–126)
Anion gap: 9 (ref 5–15)
BUN: 54 mg/dL — ABNORMAL HIGH (ref 6–20)
CO2: 22 mmol/L (ref 22–32)
Calcium: 7.9 mg/dL — ABNORMAL LOW (ref 8.9–10.3)
Chloride: 107 mmol/L (ref 98–111)
Creatinine, Ser: 4.35 mg/dL — ABNORMAL HIGH (ref 0.61–1.24)
GFR calc Af Amer: 16 mL/min — ABNORMAL LOW (ref >60)
GFR calc non Af Amer: 14 mL/min — ABNORMAL LOW (ref >60)
Glucose, Bld: 91 mg/dL (ref 70–99)
Potassium: 5.1 mmol/L (ref 3.5–5.1)
Sodium: 138 mmol/L (ref 135–145)
Total Bilirubin: 0.5 mg/dL (ref 0.3–1.2)
Total Protein: 6.1 g/dL — ABNORMAL LOW (ref 6.5–8.1)

## 2018-10-25 LAB — MAGNESIUM: Magnesium: 2.2 mg/dL (ref 1.7–2.4)

## 2018-10-25 MED ORDER — SODIUM CHLORIDE 0.9 % IV SOLN
Freq: Once | INTRAVENOUS | Status: AC
  Administered 2018-10-25: 09:00:00 via INTRAVENOUS

## 2018-10-25 MED ORDER — SODIUM CHLORIDE 0.9% FLUSH
10.0000 mL | INTRAVENOUS | Status: DC | PRN
  Administered 2018-10-25: 10 mL
  Filled 2018-10-25: qty 10

## 2018-10-25 MED ORDER — SODIUM CHLORIDE 0.9 % IV SOLN
300.0000 mg/m2 | Freq: Once | INTRAVENOUS | Status: AC
  Administered 2018-10-25: 600 mg via INTRAVENOUS
  Filled 2018-10-25: qty 30

## 2018-10-25 MED ORDER — EPOETIN ALFA-EPBX 10000 UNIT/ML IJ SOLN
INTRAMUSCULAR | Status: AC
  Filled 2018-10-25: qty 1

## 2018-10-25 MED ORDER — EPOETIN ALFA-EPBX 10000 UNIT/ML IJ SOLN
20000.0000 [IU] | Freq: Once | INTRAMUSCULAR | Status: AC
  Administered 2018-10-25: 20000 [IU] via SUBCUTANEOUS
  Filled 2018-10-25: qty 2

## 2018-10-25 MED ORDER — HEPARIN SOD (PORK) LOCK FLUSH 100 UNIT/ML IV SOLN
500.0000 [IU] | Freq: Once | INTRAVENOUS | Status: AC | PRN
  Administered 2018-10-25: 500 [IU]

## 2018-10-25 MED ORDER — PALONOSETRON HCL INJECTION 0.25 MG/5ML
INTRAVENOUS | Status: AC
  Filled 2018-10-25: qty 5

## 2018-10-25 MED ORDER — BORTEZOMIB CHEMO SQ INJECTION 3.5 MG (2.5MG/ML)
1.3000 mg/m2 | Freq: Once | INTRAMUSCULAR | Status: AC
  Administered 2018-10-25: 2.5 mg via SUBCUTANEOUS
  Filled 2018-10-25: qty 1

## 2018-10-25 MED ORDER — SODIUM CHLORIDE 0.9 % IV SOLN
20.0000 mg | Freq: Once | INTRAVENOUS | Status: AC
  Administered 2018-10-25: 20 mg via INTRAVENOUS
  Filled 2018-10-25: qty 20

## 2018-10-25 MED ORDER — DIPHENHYDRAMINE HCL 25 MG PO CAPS
ORAL_CAPSULE | ORAL | Status: AC
  Filled 2018-10-25: qty 1

## 2018-10-25 MED ORDER — PALONOSETRON HCL INJECTION 0.25 MG/5ML
0.2500 mg | Freq: Once | INTRAVENOUS | Status: AC
  Administered 2018-10-25: 0.25 mg via INTRAVENOUS

## 2018-10-25 NOTE — Patient Instructions (Signed)
[facility] Discharge Instructions for Patients Receiving Chemotherapy   Beginning January 23rd 2017 lab work for the [facility] will be done in the  Main lab at [facility] on 1st floor. If you have a lab appointment with the [facility] please come in thru the  Main Entrance and check in at the main information desk   Today you received the following chemotherapy agents Cytoxan and Velcade as well as Retacrit injection. Follow-up as scheduled. Call clinic for any questions or concerns  To help prevent nausea and vomiting after your treatment, we encourage you to take your nausea medication   If you develop nausea and vomiting, or diarrhea that is not controlled by your medication, call the clinic.  The clinic phone number is [phone]. Office hours are Monday-Friday 8:30am-5:00pm.  BELOW ARE SYMPTOMS THAT SHOULD BE REPORTED IMMEDIATELY:  *FEVER GREATER THAN 101.0 F  *CHILLS WITH OR WITHOUT FEVER  NAUSEA AND VOMITING THAT IS NOT CONTROLLED WITH YOUR NAUSEA MEDICATION  *UNUSUAL SHORTNESS OF BREATH  *UNUSUAL BRUISING OR BLEEDING  TENDERNESS IN MOUTH AND THROAT WITH OR WITHOUT PRESENCE OF ULCERS  *URINARY PROBLEMS  *BOWEL PROBLEMS  UNUSUAL RASH Items with * indicate a potential emergency and should be followed up as soon as possible. If you have an emergency after office hours please contact your primary care physician or go to the nearest emergency department.  Please call the clinic during office hours if you have any questions or concerns.   You may also contact the Patient Navigator at [phone] should you have any questions or need assistance in obtaining follow up care.      Resources For Cancer Patients and their Caregivers ? American Cancer Society: Can assist with transportation, wigs, general needs, runs Look Good Feel Better.        939-134-8550 ? Cancer Care: Provides financial assistance, online support groups,  medication/co-pay assistance.  1-800-813-HOPE (939) 635-5120) ? [facility] Assists [place] cancer patients and their families through emotional , educational and financial support.  (518)332-9188 ? [place] DSS Where to apply for food stamps, Medicaid and utility assistance. 782-215-2466 ? [employer]: Transportation to medical appointments. 385-576-6071 ? Social Security Administration: May apply for disability if have a Stage IV cancer. 2136484118 [phone] ? [employer], Disability and Transit Services: Assists with nutrition, care and transit needs. 605-181-7374

## 2018-10-25 NOTE — Progress Notes (Signed)
0910 Labs reviewed with Dr. Delton Coombes and pt approved for chemo tx today per MD                                [name] tolerated Cytoxan infusion and Velcade and Retacrit injections well without complaints or incident. VSS upon discharge. Hgb 9.6 today. Pt discharged self ambulatory in satisfactory condition

## 2018-10-25 NOTE — Patient Instructions (Signed)
[facility] at [facility] Discharge Instructions  Labs drawn from portacath   Thank you for choosing [facility] at [facility] to provide your oncology and hematology care.  To afford each patient quality time with our provider, please arrive at least 15 minutes before your scheduled appointment time.   If you have a lab appointment with the Cancer Center please come in thru the  Main Entrance and check in at the main information desk  You need to re-schedule your appointment should you arrive 10 or more minutes late.  We strive to give you quality time with our providers, and arriving late affects you and other patients whose appointments are after yours.  Also, if you no show three or more times for appointments you may be dismissed from the clinic at the providers discretion.     Again, thank you for choosing [facility].  Our hope is that these requests will decrease the amount of time that you wait before being seen by our physicians.       _____________________________________________________________  Should you have questions after your visit to [facility], please contact our office at [phone] between the hours of 8:00 a.m. and 4:30 p.m.  Voicemails left after 4:00 p.m. will not be returned until the following business day.  For prescription refill requests, have your pharmacy contact our office and allow 72 hours.    Cancer Center Support Programs:   > Cancer Support Group  2nd Tuesday of the month 1pm-2pm, Journey Room   

## 2018-11-01 ENCOUNTER — Inpatient Hospital Stay (HOSPITAL_COMMUNITY): Payer: Medicare Other

## 2018-11-01 ENCOUNTER — Inpatient Hospital Stay (HOSPITAL_BASED_OUTPATIENT_CLINIC_OR_DEPARTMENT_OTHER): Payer: Medicare Other | Admitting: Hematology

## 2018-11-01 ENCOUNTER — Inpatient Hospital Stay (HOSPITAL_COMMUNITY): Payer: Medicare Other | Attending: Hematology

## 2018-11-01 ENCOUNTER — Encounter (HOSPITAL_COMMUNITY): Payer: Self-pay | Admitting: Hematology

## 2018-11-01 ENCOUNTER — Other Ambulatory Visit: Payer: Self-pay

## 2018-11-01 VITALS — BP 115/64 | HR 83 | Temp 98.2°F | Resp 18

## 2018-11-01 DIAGNOSIS — Z5111 Encounter for antineoplastic chemotherapy: Secondary | ICD-10-CM | POA: Diagnosis not present

## 2018-11-01 DIAGNOSIS — C9000 Multiple myeloma not having achieved remission: Secondary | ICD-10-CM | POA: Diagnosis present

## 2018-11-01 DIAGNOSIS — E875 Hyperkalemia: Secondary | ICD-10-CM | POA: Insufficient documentation

## 2018-11-01 DIAGNOSIS — M545 Low back pain: Secondary | ICD-10-CM | POA: Diagnosis not present

## 2018-11-01 DIAGNOSIS — D631 Anemia in chronic kidney disease: Secondary | ICD-10-CM | POA: Insufficient documentation

## 2018-11-01 DIAGNOSIS — Z5112 Encounter for antineoplastic immunotherapy: Secondary | ICD-10-CM | POA: Insufficient documentation

## 2018-11-01 DIAGNOSIS — N189 Chronic kidney disease, unspecified: Secondary | ICD-10-CM | POA: Diagnosis present

## 2018-11-01 DIAGNOSIS — N185 Chronic kidney disease, stage 5: Secondary | ICD-10-CM

## 2018-11-01 LAB — CBC WITH DIFFERENTIAL/PLATELET
Abs Immature Granulocytes: 0.02 10*3/uL (ref 0.00–0.07)
Basophils Absolute: 0.1 10*3/uL (ref 0.0–0.1)
Basophils Relative: 1 %
Eosinophils Absolute: 0.3 10*3/uL (ref 0.0–0.5)
Eosinophils Relative: 7 %
HCT: 31.2 % — ABNORMAL LOW (ref 39.0–52.0)
Hemoglobin: 9.9 g/dL — ABNORMAL LOW (ref 13.0–17.0)
Immature Granulocytes: 0 %
Lymphocytes Relative: 20 %
Lymphs Abs: 1 10*3/uL (ref 0.7–4.0)
MCH: 32.4 pg (ref 26.0–34.0)
MCHC: 31.7 g/dL (ref 30.0–36.0)
MCV: 102 fL — ABNORMAL HIGH (ref 80.0–100.0)
Monocytes Absolute: 0.9 10*3/uL (ref 0.1–1.0)
Monocytes Relative: 17 %
Neutro Abs: 2.8 10*3/uL (ref 1.7–7.7)
Neutrophils Relative %: 55 %
Platelets: 191 10*3/uL (ref 150–400)
RBC: 3.06 MIL/uL — ABNORMAL LOW (ref 4.22–5.81)
RDW: 18.3 % — ABNORMAL HIGH (ref 11.5–15.5)
WBC: 5.1 10*3/uL (ref 4.0–10.5)
nRBC: 0 % (ref 0.0–0.2)

## 2018-11-01 LAB — COMPREHENSIVE METABOLIC PANEL
ALT: 10 U/L (ref 0–44)
AST: 13 U/L — ABNORMAL LOW (ref 15–41)
Albumin: 4 g/dL (ref 3.5–5.0)
Alkaline Phosphatase: 72 U/L (ref 38–126)
Anion gap: 9 (ref 5–15)
BUN: 49 mg/dL — ABNORMAL HIGH (ref 6–20)
CO2: 23 mmol/L (ref 22–32)
Calcium: 8 mg/dL — ABNORMAL LOW (ref 8.9–10.3)
Chloride: 108 mmol/L (ref 98–111)
Creatinine, Ser: 4.49 mg/dL — ABNORMAL HIGH (ref 0.61–1.24)
GFR calc Af Amer: 15 mL/min — ABNORMAL LOW (ref >60)
GFR calc non Af Amer: 13 mL/min — ABNORMAL LOW (ref >60)
Glucose, Bld: 92 mg/dL (ref 70–99)
Potassium: 5.3 mmol/L — ABNORMAL HIGH (ref 3.5–5.1)
Sodium: 140 mmol/L (ref 135–145)
Total Bilirubin: 0.5 mg/dL (ref 0.3–1.2)
Total Protein: 6.4 g/dL — ABNORMAL LOW (ref 6.5–8.1)

## 2018-11-01 LAB — LACTATE DEHYDROGENASE: LDH: 188 U/L (ref 98–192)

## 2018-11-01 MED ORDER — BORTEZOMIB CHEMO SQ INJECTION 3.5 MG (2.5MG/ML)
1.3000 mg/m2 | Freq: Once | INTRAMUSCULAR | Status: AC
  Administered 2018-11-01: 2.5 mg via SUBCUTANEOUS
  Filled 2018-11-01: qty 1

## 2018-11-01 MED ORDER — PALONOSETRON HCL INJECTION 0.25 MG/5ML
0.2500 mg | Freq: Once | INTRAVENOUS | Status: AC
  Administered 2018-11-01: 0.25 mg via INTRAVENOUS
  Filled 2018-11-01: qty 5

## 2018-11-01 MED ORDER — EPOETIN ALFA-EPBX 10000 UNIT/ML IJ SOLN
20000.0000 [IU] | Freq: Once | INTRAMUSCULAR | Status: AC
  Administered 2018-11-01: 20000 [IU] via SUBCUTANEOUS
  Filled 2018-11-01: qty 2

## 2018-11-01 MED ORDER — HYDROMORPHONE HCL 4 MG PO TABS: 4.0000 mg | ORAL_TABLET | Freq: Three times a day (TID) | ORAL | 0 refills | Status: DC | PRN

## 2018-11-01 MED ORDER — DEXTROSE 50 % IV SOLN
50.0000 mL | Freq: Once | INTRAVENOUS | Status: AC
  Administered 2018-11-01: 11:00:00 50 mL via INTRAVENOUS
  Filled 2018-11-01: qty 50

## 2018-11-01 MED ORDER — SODIUM CHLORIDE 0.9 % IV SOLN
300.0000 mg/m2 | Freq: Once | INTRAVENOUS | Status: AC
  Administered 2018-11-01: 12:00:00 600 mg via INTRAVENOUS
  Filled 2018-11-01: qty 30

## 2018-11-01 MED ORDER — SODIUM CHLORIDE 0.9 % IV SOLN
Freq: Once | INTRAVENOUS | Status: AC
  Administered 2018-11-01: 10:00:00 via INTRAVENOUS

## 2018-11-01 MED ORDER — HEPARIN SOD (PORK) LOCK FLUSH 100 UNIT/ML IV SOLN
500.0000 [IU] | Freq: Once | INTRAVENOUS | Status: AC | PRN
  Administered 2018-11-01: 12:00:00 500 [IU]

## 2018-11-01 MED ORDER — SODIUM CHLORIDE 0.9 % IV SOLN
20.0000 mg | Freq: Once | INTRAVENOUS | Status: AC
  Administered 2018-11-01: 11:00:00 20 mg via INTRAVENOUS
  Filled 2018-11-01: qty 20

## 2018-11-01 MED ORDER — INSULIN REGULAR HUMAN 100 UNIT/ML IJ SOLN
10.0000 [IU] | Freq: Once | INTRAMUSCULAR | Status: DC
  Filled 2018-11-01: qty 0.1

## 2018-11-01 MED ORDER — INSULIN REGULAR HUMAN 100 UNIT/ML IJ SOLN
10.0000 [IU] | Freq: Once | INTRAMUSCULAR | Status: AC
  Administered 2018-11-01: 11:00:00 10 [IU] via INTRAVENOUS
  Filled 2018-11-01: qty 10

## 2018-11-01 MED ORDER — SODIUM CHLORIDE 0.9% FLUSH
10.0000 mL | INTRAVENOUS | Status: DC | PRN
  Administered 2018-11-01: 10 mL
  Filled 2018-11-01: qty 10

## 2018-11-01 NOTE — Progress Notes (Signed)
[name], [place] [address]   CLINIC:  Medical Oncology/Hematology  PCP:  Rosita Fire, MD [place] [address] [phone]   REASON FOR VISIT: Follow-up for Multiple myeloma    BRIEF ONCOLOGIC HISTORY:  Oncology History  Kappa light chain myeloma ([place])  06/16/2018 Initial Diagnosis   Kappa light chain myeloma ([place])   06/17/2018 -  Chemotherapy   The patient had palonosetron (ALOXI) injection 0.25 mg, 0.25 mg, Intravenous,  Once, 20 of 22 cycles Administration: 0.25 mg (06/17/2018), 0.25 mg (06/23/2018), 0.25 mg (06/30/2018), 0.25 mg (07/07/2018), 0.25 mg (07/14/2018), 0.25 mg (07/22/2018), 0.25 mg (07/29/2018), 0.25 mg (08/06/2018), 0.25 mg (08/13/2018), 0.25 mg (08/20/2018), 0.25 mg (08/27/2018), 0.25 mg (09/06/2018), 0.25 mg (09/13/2018), 0.25 mg (09/20/2018), 0.25 mg (09/27/2018), 0.25 mg (10/04/2018), 0.25 mg (10/11/2018), 0.25 mg (10/18/2018), 0.25 mg (10/25/2018) bortezomib SQ (VELCADE) chemo injection 2.5 mg, 1.3 mg/m2 = 2.5 mg, Subcutaneous,  Once, 20 of 22 cycles Administration: 2.5 mg (06/17/2018), 2.5 mg (06/23/2018), 2.5 mg (06/30/2018), 2.5 mg (07/07/2018), 2.5 mg (07/14/2018), 2.5 mg (07/22/2018), 2.5 mg (07/29/2018), 2.5 mg (08/06/2018), 2.5 mg (08/13/2018), 2.5 mg (08/20/2018), 2.5 mg (08/27/2018), 2.5 mg (09/06/2018), 2.5 mg (09/13/2018), 2.5 mg (09/20/2018), 2.5 mg (09/27/2018), 2.5 mg (10/04/2018), 2.5 mg (10/11/2018), 2.5 mg (10/18/2018), 2.5 mg (10/25/2018) cyclophosphamide (CYTOXAN) 300 mg in sodium chloride 0.9 % 250 mL chemo infusion, 300 mg (100 % of original dose 300 mg), Intravenous,  Once, 20 of 22 cycles Dose modification: 300 mg (original dose 300 mg, Cycle 1, Reason: Change in SCr/CrCl), 500 mg (original dose 500 mg, Cycle 2, Reason: Provider Judgment), 500 mg (original dose 500 mg, Cycle 3, Reason: Change in SCr/CrCl) Administration: 300 mg (06/17/2018), 500 mg (06/23/2018), 500 mg (06/30/2018), 600 mg (07/07/2018), 600 mg (07/14/2018), 600 mg  (07/22/2018), 600 mg (07/29/2018), 600 mg (08/06/2018), 600 mg (08/13/2018), 600 mg (08/20/2018), 600 mg (08/27/2018), 600 mg (09/06/2018), 600 mg (09/13/2018), 600 mg (09/20/2018), 600 mg (09/27/2018), 600 mg (10/04/2018), 600 mg (10/11/2018), 600 mg (10/18/2018), 600 mg (10/25/2018)  for chemotherapy treatment.       INTERVAL HISTORY:  [name] 58 y.o. male seen for follow-up of multiple myeloma.  Appetite is 50% and energy levels are 50%.  Pain in the back and right shoulder is well controlled with the current regimen.  He requests refill for Dilaudid.  He reports some numbness in the bottom of his toes which is also stable.  No pins-and-needles sensation.  No fevers or infections.  Denies any ER visits or hospitalizations.     REVIEW OF SYSTEMS:  Review of Systems  Musculoskeletal: Positive for back pain.  Neurological: Positive for numbness.  All other systems reviewed and are negative.    PAST MEDICAL/SURGICAL HISTORY:  Past Medical History:  Diagnosis Date  . Allergy   . Arthritis    neck and back  . Blood transfusion without reported diagnosis   . BPH (benign prostatic hyperplasia)   . Cancer [facility]) 2004   testicle  . Chronic kidney disease    kidney stones  . Left lumbar radiculopathy 06/12/2016  . 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 Medical history non-contributory    Pt has scattered thoughts and uncertain of past medical history  . 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 Stones, urinary tract   . Substance abuse ([place])    prescribed oxydcodone- 10 years   Past Surgical History:  Procedure Laterality Date  . BACK SURGERY     x5  . CERVICAL  [place] SURGERY     x2  . COLONOSCOPY WITH PROPOFOL N/A 08/11/2016   Dr. Gala Romney: non-bleeding internal hemorrhoids, one 4 mm hyperplastic rectal polyp, diverticulosis in entire examined colon  . POLYPECTOMY  08/11/2016   Procedure: POLYPECTOMY;  Surgeon: [name], MD;  Location: [facility] ENDO SUITE;  Service: Endoscopy;;  colon  . PORTACATH  PLACEMENT Left 09/20/2018   Procedure: INSERTION PORT-A-CATH (catheter attached left subclavian);  Surgeon: Virl Cagey, MD;  Location: [facility] ORS;  Service: General;  Laterality: Left;  . testicular cancer  2004  . TONSILLECTOMY       SOCIAL HISTORY:  Social History   Socioeconomic History  . Marital status: Single    Spouse name: Not on file  . Number of children: 1  . Years of education: 32  . Highest education level: Not on file  Occupational History  . Occupation: retired    Comment: Psychologist, prison and probation services  . Occupation: disabled  Social Needs  . Financial resource strain: Not on file  . Food insecurity    Worry: Not on file    Inability: Not on file  . Transportation needs    Medical: Not on file    Non-medical: Not on file  Tobacco Use  . Smoking status: Current Every Day Smoker    Packs/day: 1.00    Years: 40.00    Pack years: 40.00    Types: Cigarettes    Start date: 03/31/1974  . Smokeless tobacco: Never Used  Substance and Sexual Activity  . Alcohol use: Yes    Comment: Occasional  . Drug use: No    Types: Cocaine    Comment: Remote hx of cocaine, quit 1989  . Sexual activity: Yes  Lifestyle  . Physical activity    Days per week: Not on file    Minutes per session: Not on file  . Stress: Not on file  Relationships  . Social Herbalist on phone: Not on file    Gets together: Not on file    Attends religious service: Not on file    Active member of club or organization: Not on file    Attends meetings of clubs or organizations: Not on file    Relationship status: Not on file  . Intimate partner violence    Fear of current or ex partner: No    Emotionally abused: No    Physically abused: No    Forced sexual activity: No  Other Topics Concern  . Not on file  Social History Narrative   [employer] for 12 years   [employer] for 41 years      Never married   One daughter      Lives alone   Retail banker   Right-handed    Occasional caffeine use          FAMILY HISTORY:  Family History  Problem Relation Age of Onset  . COPD Mother   . Heart disease Mother   . Other Father        Never knew his father.  . Thyroid disease Sister   . Arthritis Sister   . Heart disease Sister        bypass  . Colon cancer Neg Hx     CURRENT MEDICATIONS:  Outpatient Encounter Medications as of 11/01/2018  Medication Sig  . bortezomib IV (VELCADE) 3.5 MG injection Inject 1.3 mg/m2 into the vein once a week.   . calcitRIOL (ROCALTROL) 0.5 MCG capsule  Take 0.5 mcg by mouth daily.   . CYCLOPHOSPHAMIDE IV Inject into the vein once a week.  Marland Kitchen Epoetin Alfa (PROCRIT IJ) Inject as directed. Unsure of dosage- gets this once weekly  . HYDROmorphone (DILAUDID) 4 MG tablet Take 1 tablet (4 mg total) by mouth every 8 (eight) hours as needed for severe pain.  . Misc. Devices MISC Please provide patient with rollaider.  Dx: multiple myeloma C90.0  . Patiromer Sorbitex Calcium (VELTASSA PO) Take by mouth 2 (two) times a week.   . senna (SENOKOT) 8.6 MG tablet Take 1 tablet by mouth 2 (two) times daily.   . sodium bicarbonate 650 MG tablet Take 1,300 mg by mouth 2 (two) times daily.  Marland Kitchen torsemide (DEMADEX) 20 MG tablet TAKING 1 TABLET DAILY, AND 2 TABLETS ONLY IF HIS ANKLES ARE REALLY SWOLLEN  . valACYclovir (VALTREX) 500 MG tablet Take 1 tablet (500 mg total) by mouth 2 (two) times daily.  . [DISCONTINUED] calcitRIOL (ROCALTROL) 0.25 MCG capsule Take 1 capsule by mouth daily.  . [DISCONTINUED] HYDROmorphone (DILAUDID) 4 MG tablet Take 1 tablet (4 mg total) by mouth every 8 (eight) hours as needed for severe pain.  . diphenhydrAMINE (BENADRYL) 25 MG tablet Take 1 tablet (25 mg total) by mouth as needed for itching. (Patient not taking: Reported on 11/01/2018)   No facility-administered encounter medications on file as of 11/01/2018.     ALLERGIES:  Allergies  Allergen Reactions  . Oxycodone-Acetaminophen   . Acetaminophen Nausea Only  and Other (See Comments)    Elevated liver enzymes  . Cymbalta [Duloxetine Hcl] Swelling    Facial swelling  . Diclofenac Sodium Swelling       . Imodium [Loperamide] Swelling    facial  . Lyrica [Pregabalin] Swelling  . Morphine Other (See Comments)    Bradycardia   . Vioxx [Rofecoxib] Swelling  . Aleve [Naproxen] Swelling    eye     PHYSICAL EXAM:  ECOG Performance status: 1  Vitals:   11/01/18 0848  BP: 133/73  Pulse: 77  Resp: 18  Temp: (!) 97.3 F (36.3 C)  SpO2: 99%   Filed Weights   11/01/18 0848  Weight: 169 lb 12.8 oz (77 kg)    Physical Exam Vitals signs reviewed.  Constitutional:      Appearance: Normal appearance. He is normal weight.  Cardiovascular:     Rate and Rhythm: Normal rate and regular rhythm.     Heart sounds: Normal heart sounds.  Pulmonary:     Effort: Pulmonary effort is normal.     Breath sounds: Normal breath sounds.  Abdominal:     General: Bowel sounds are normal.     Palpations: Abdomen is soft.  Musculoskeletal: Normal range of motion.     Right lower leg: No edema.     Left lower leg: No edema.  Skin:    General: Skin is warm and dry.  Neurological:     Mental Status: He is alert and oriented to person, place, and time. Mental status is at baseline.  Psychiatric:        Mood and Affect: Mood normal.        Behavior: Behavior normal.      LABORATORY DATA:  I have reviewed the labs as listed.  CBC    Component Value Date/Time   WBC 5.1 11/01/2018 0837   RBC 3.06 (L) 11/01/2018 0837   HGB 9.9 (L) 11/01/2018 0837   HCT 31.2 (L) 11/01/2018 0837   PLT 191 11/01/2018  0837   MCV 102.0 (H) 11/01/2018 0837   MCH 32.4 11/01/2018 0837   MCHC 31.7 11/01/2018 0837   RDW 18.3 (H) 11/01/2018 0837   LYMPHSABS 1.0 11/01/2018 0837   MONOABS 0.9 11/01/2018 0837   EOSABS 0.3 11/01/2018 0837   BASOSABS 0.1 11/01/2018 0837   CMP Latest Ref Rng & Units 11/01/2018 10/25/2018 10/18/2018  Glucose 70 - 99 mg/dL 92 91 109(H)  BUN 6 -  20 mg/dL 49(H) 54(H) 56(H)  Creatinine 0.61 - 1.24 mg/dL 4.49(H) 4.35(H) 4.35(H)  Sodium 135 - 145 mmol/L 140 138 137  Potassium 3.5 - 5.1 mmol/L 5.3(H) 5.1 5.0  Chloride 98 - 111 mmol/L 108 107 107  CO2 22 - 32 mmol/L 23 22 20(L)  Calcium 8.9 - 10.3 mg/dL 8.0(L) 7.9(L) 8.0(L)  Total Protein 6.5 - 8.1 g/dL 6.4(L) 6.1(L) 6.7  Total Bilirubin 0.3 - 1.2 mg/dL 0.5 0.5 0.6  Alkaline Phos 38 - 126 U/L 72 73 79  AST 15 - 41 U/L 13(L) 18 22  ALT 0 - 44 U/L 10 12 13       I have personally reviewed his scans and discussed with him.     ASSESSMENT & PLAN:   Kappa light chain myeloma ([place]) 1.  Kappa light chain myeloma: - Presentation with acute renal failure and hypercalcemia.  M spike was 0.4 g.  Immunofixation did not show any immunoglobulin heavy chain.  Kappa free light chains was 12,085.  Free light chain ratio of 1981 and LDH of 224. -Bone marrow biopsy on 06/16/2018 shows 87% atypical plasma cells.  Cytogenetics shows complex chromosome abnormalities.  Gain of 1 q. was seen.  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 minus; 17 P- - Started on weekly CyBorD on 06/17/2018. - He is continuing to tolerate treatment very well.  Last myeloma labs on 09/27/2018 shows M spike of 0.9 g.  Free light chain ratio has decreased to 287.  Kappa light chains were 3014. - Creatinine also stabilized around 4.3. -He will proceed with his next cycle today.  We have reviewed his blood work.  We sent myeloma panel today. -I will see him back in 1 week to discuss the results.  2.  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 -He is getting weekly Retacrit 20,000 units.  Hemoglobin is staying between 9 and 10.  3.  Hyperkalemia: -His potassium is 5.3 today.  He has poor adherence to low potassium foods. - He is also on Veltassa.  We will treat his hyperkalemia today.  3.  ID prophylaxis: -He had dressed with acyclovir.  He will continue Valtrex 100 mg twice daily.  4.  Low back  pain: -He is taking Dilaudid 4 mg every 8 hours which is controlling the pain.  We will give a refill.  5.  Lower extremity edema: - Swelling has improved.  We will cut back on torsemide to 20 mg daily.  Total time spent is 25 minutes with more than 50% of the time spent face-to-face discussing treatment plan and coordination of care.    Orders placed this encounter:  No orders of the defined types were placed in this encounter.     Derek Jack, MD [place] [phone]

## 2018-11-01 NOTE — Patient Instructions (Signed)
[facility] at [facility] Discharge Instructions  Labs drawn from portacath today   Thank you for choosing [facility] at [facility] to provide your oncology and hematology care.  To afford each patient quality time with our provider, please arrive at least 15 minutes before your scheduled appointment time.   If you have a lab appointment with the Cancer Center please come in thru the  Main Entrance and check in at the main information desk  You need to re-schedule your appointment should you arrive 10 or more minutes late.  We strive to give you quality time with our providers, and arriving late affects you and other patients whose appointments are after yours.  Also, if you no show three or more times for appointments you may be dismissed from the clinic at the providers discretion.     Again, thank you for choosing [facility].  Our hope is that these requests will decrease the amount of time that you wait before being seen by our physicians.       _____________________________________________________________  Should you have questions after your visit to [facility], please contact our office at [phone] between the hours of 8:00 a.m. and 4:30 p.m.  Voicemails left after 4:00 p.m. will not be returned until the following business day.  For prescription refill requests, have your pharmacy contact our office and allow 72 hours.    Cancer Center Support Programs:   > Cancer Support Group  2nd Tuesday of the month 1pm-2pm, Journey Room   

## 2018-11-01 NOTE — Progress Notes (Signed)
11/01/18  Potassium level 5.3 today  Orders received for patient to receive:  Regular Insulin 10 units Intravenously x 1 dose then  Dextrose 50% 1 amp (50 mL) intravenously  x 1 dose.  T.O. Dr Beckey Downing LPN/Emilea Goga Ronnald Ramp, PharmD

## 2018-11-01 NOTE — Progress Notes (Signed)
0630 Labs reviewed with and pt seen by Dr. Delton Coombes and pt approved for chemo tx today with IV insulin and D50 added as well for K+ of 5.3 per MD.                                         [name] tolerated chemo tx and Retacrit injection well without complaints or incident.Hgb 9.9 today VSS upon discharge. Pt discharged self ambulatory in satisfactory condition

## 2018-11-01 NOTE — Assessment & Plan Note (Signed)
1.  Kappa light chain myeloma: - Presentation with acute renal failure and hypercalcemia.  M spike was 0.4 g.  Immunofixation did not show any immunoglobulin heavy chain.  Kappa free light chains was 12,085.  Free light chain ratio of 1981 and LDH of 224. -Bone marrow biopsy on 06/16/2018 shows 87% atypical plasma cells.  Cytogenetics shows complex chromosome abnormalities.  Gain of 1 q. was seen.  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 minus; 17 P- - Started on weekly CyBorD on 06/17/2018. - He is continuing to tolerate treatment very well.  Last myeloma labs on 09/27/2018 shows M spike of 0.9 g.  Free light chain ratio has decreased to 287.  Kappa light chains were 3014. - Creatinine also stabilized around 4.3. -He will proceed with his next cycle today.  We have reviewed his blood work.  We sent myeloma panel today. -I will see him back in 1 week to discuss the results.  2.  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 -He is getting weekly Retacrit 20,000 units.  Hemoglobin is staying between 9 and 10.  3.  Hyperkalemia: -His potassium is 5.3 today.  He has poor adherence to low potassium foods. - He is also on Veltassa.  We will treat his hyperkalemia today.  3.  ID prophylaxis: -He had dressed with acyclovir.  He will continue Valtrex 100 mg twice daily.  4.  Low back pain: -He is taking Dilaudid 4 mg every 8 hours which is controlling the pain.  We will give a refill.  5.  Lower extremity edema: - Swelling has improved.  We will cut back on torsemide to 20 mg daily.

## 2018-11-01 NOTE — Patient Instructions (Signed)
[facility] Discharge Instructions for Patients Receiving Chemotherapy   Beginning January 23rd 2017 lab work for the [facility] will be done in the  Main lab at [facility] on 1st floor. If you have a lab appointment with the [facility] please come in thru the  Main Entrance and check in at the main information desk   Today you received the following chemotherapy agents Cytoxan and Velcade as well as Retacrit injection. Follow-up as scheduled. Call clinic for any questions or concerns  To help prevent nausea and vomiting after your treatment, we encourage you to take your nausea medication   If you develop nausea and vomiting, or diarrhea that is not controlled by your medication, call the clinic.  The clinic phone number is [phone]. Office hours are Monday-Friday 8:30am-5:00pm.  BELOW ARE SYMPTOMS THAT SHOULD BE REPORTED IMMEDIATELY:  *FEVER GREATER THAN 101.0 F  *CHILLS WITH OR WITHOUT FEVER  NAUSEA AND VOMITING THAT IS NOT CONTROLLED WITH YOUR NAUSEA MEDICATION  *UNUSUAL SHORTNESS OF BREATH  *UNUSUAL BRUISING OR BLEEDING  TENDERNESS IN MOUTH AND THROAT WITH OR WITHOUT PRESENCE OF ULCERS  *URINARY PROBLEMS  *BOWEL PROBLEMS  UNUSUAL RASH Items with * indicate a potential emergency and should be followed up as soon as possible. If you have an emergency after office hours please contact your primary care physician or go to the nearest emergency department.  Please call the clinic during office hours if you have any questions or concerns.   You may also contact the Patient Navigator at [phone] should you have any questions or need assistance in obtaining follow up care.      Resources For Cancer Patients and their Caregivers ? American Cancer Society: Can assist with transportation, wigs, general needs, runs Look Good Feel Better.        817-240-3400 ? Cancer Care: Provides financial assistance, online support groups,  medication/co-pay assistance.  1-800-813-HOPE (918)299-3388) ? [place] Assists [place] cancer patients and their families through emotional , educational and financial support.  603-409-9976 ? [place] DSS Where to apply for food stamps, Medicaid and utility assistance. 929-672-3141 ? [facility]: Transportation to medical appointments. (786)372-1658 ? Social Security Administration: May apply for disability if have a Stage IV cancer. 907-791-0652 2673247102 ? [employer], Disability and Transit Services: Assists with nutrition, care and transit needs. (306)482-3937

## 2018-11-01 NOTE — Patient Instructions (Addendum)
[facility] at [facility] Discharge Instructions  You were seen today by Dr. Katragadda. He went over your recent lab results. He will see you back in 1 week for labs and follow up.   Thank you for choosing [facility] at [facility] to provide your oncology and hematology care.  To afford each patient quality time with our provider, please arrive at least 15 minutes before your scheduled appointment time.   If you have a lab appointment with the Cancer Center please come in thru the  Main Entrance and check in at the main information desk  You need to re-schedule your appointment should you arrive 10 or more minutes late.  We strive to give you quality time with our providers, and arriving late affects you and other patients whose appointments are after yours.  Also, if you no show three or more times for appointments you may be dismissed from the clinic at the providers discretion.     Again, thank you for choosing [facility].  Our hope is that these requests will decrease the amount of time that you wait before being seen by our physicians.       _____________________________________________________________  Should you have questions after your visit to [facility], please contact our office at [phone] between the hours of 8:00 a.m. and 4:30 p.m.  Voicemails left after 4:00 p.m. will not be returned until the following business day.  For prescription refill requests, have your pharmacy contact our office and allow 72 hours.    Cancer Center Support Programs:   > Cancer Support Group  2nd Tuesday of the month 1pm-2pm, Journey Room    

## 2018-11-02 LAB — PROTEIN ELECTROPHORESIS, SERUM
A/G Ratio: 2.1 — ABNORMAL HIGH (ref 0.7–1.7)
Albumin ELP: 3.9 g/dL (ref 2.9–4.4)
Alpha-1-Globulin: 0.1 g/dL (ref 0.0–0.4)
Alpha-2-Globulin: 0.7 g/dL (ref 0.4–1.0)
Beta Globulin: 0.6 g/dL — ABNORMAL LOW (ref 0.7–1.3)
Gamma Globulin: 0.6 g/dL (ref 0.4–1.8)
Globulin, Total: 1.9 g/dL — ABNORMAL LOW (ref 2.2–3.9)
Total Protein ELP: 5.8 g/dL — ABNORMAL LOW (ref 6.0–8.5)

## 2018-11-02 LAB — KAPPA/LAMBDA LIGHT CHAINS
Kappa free light chain: 1347.7 mg/L — ABNORMAL HIGH (ref 3.3–19.4)
Kappa, lambda light chain ratio: 95.58 — ABNORMAL HIGH (ref 0.26–1.65)
Lambda free light chains: 14.1 mg/L (ref 5.7–26.3)

## 2018-11-03 ENCOUNTER — Encounter: Payer: Self-pay | Admitting: General Practice

## 2018-11-03 NOTE — Progress Notes (Signed)
[facility] CSW Progress Notes  REferred patient to [place] to receive monthly box of food and essential items.  Edwyna Shell, LCSW Clinical Social Worker Phone:  [phone]

## 2018-11-08 ENCOUNTER — Encounter (HOSPITAL_COMMUNITY): Payer: Self-pay | Admitting: Lab

## 2018-11-08 ENCOUNTER — Inpatient Hospital Stay (HOSPITAL_COMMUNITY): Payer: Medicare Other

## 2018-11-08 ENCOUNTER — Inpatient Hospital Stay (HOSPITAL_BASED_OUTPATIENT_CLINIC_OR_DEPARTMENT_OTHER): Payer: Medicare Other | Admitting: Hematology

## 2018-11-08 ENCOUNTER — Other Ambulatory Visit: Payer: Self-pay

## 2018-11-08 ENCOUNTER — Encounter (HOSPITAL_COMMUNITY): Payer: Self-pay | Admitting: Hematology

## 2018-11-08 VITALS — BP 135/88 | HR 87 | Temp 97.5°F | Resp 16 | Wt 170.2 lb

## 2018-11-08 VITALS — BP 117/61 | HR 84 | Temp 97.7°F | Resp 18

## 2018-11-08 DIAGNOSIS — C9000 Multiple myeloma not having achieved remission: Secondary | ICD-10-CM

## 2018-11-08 DIAGNOSIS — R200 Anesthesia of skin: Secondary | ICD-10-CM | POA: Diagnosis not present

## 2018-11-08 DIAGNOSIS — E875 Hyperkalemia: Secondary | ICD-10-CM

## 2018-11-08 DIAGNOSIS — N185 Chronic kidney disease, stage 5: Secondary | ICD-10-CM

## 2018-11-08 DIAGNOSIS — Z5112 Encounter for antineoplastic immunotherapy: Secondary | ICD-10-CM | POA: Diagnosis not present

## 2018-11-08 LAB — COMPREHENSIVE METABOLIC PANEL
ALT: 9 U/L (ref 0–44)
AST: 13 U/L — ABNORMAL LOW (ref 15–41)
Albumin: 3.9 g/dL (ref 3.5–5.0)
Alkaline Phosphatase: 66 U/L (ref 38–126)
Anion gap: 9 (ref 5–15)
BUN: 49 mg/dL — ABNORMAL HIGH (ref 6–20)
CO2: 21 mmol/L — ABNORMAL LOW (ref 22–32)
Calcium: 7.7 mg/dL — ABNORMAL LOW (ref 8.9–10.3)
Chloride: 108 mmol/L (ref 98–111)
Creatinine, Ser: 4.22 mg/dL — ABNORMAL HIGH (ref 0.61–1.24)
GFR calc Af Amer: 17 mL/min — ABNORMAL LOW (ref >60)
GFR calc non Af Amer: 14 mL/min — ABNORMAL LOW (ref >60)
Glucose, Bld: 82 mg/dL (ref 70–99)
Potassium: 5.4 mmol/L — ABNORMAL HIGH (ref 3.5–5.1)
Sodium: 138 mmol/L (ref 135–145)
Total Bilirubin: 0.7 mg/dL (ref 0.3–1.2)
Total Protein: 6.5 g/dL (ref 6.5–8.1)

## 2018-11-08 LAB — CBC WITH DIFFERENTIAL/PLATELET
Abs Immature Granulocytes: 0.02 10*3/uL (ref 0.00–0.07)
Basophils Absolute: 0 10*3/uL (ref 0.0–0.1)
Basophils Relative: 1 %
Eosinophils Absolute: 0.2 10*3/uL (ref 0.0–0.5)
Eosinophils Relative: 5 %
HCT: 32.9 % — ABNORMAL LOW (ref 39.0–52.0)
Hemoglobin: 10.2 g/dL — ABNORMAL LOW (ref 13.0–17.0)
Immature Granulocytes: 1 %
Lymphocytes Relative: 21 %
Lymphs Abs: 0.9 10*3/uL (ref 0.7–4.0)
MCH: 31.5 pg (ref 26.0–34.0)
MCHC: 31 g/dL (ref 30.0–36.0)
MCV: 101.5 fL — ABNORMAL HIGH (ref 80.0–100.0)
Monocytes Absolute: 1 10*3/uL (ref 0.1–1.0)
Monocytes Relative: 22 %
Neutro Abs: 2.2 10*3/uL (ref 1.7–7.7)
Neutrophils Relative %: 50 %
Platelets: 228 10*3/uL (ref 150–400)
RBC: 3.24 MIL/uL — ABNORMAL LOW (ref 4.22–5.81)
RDW: 18.4 % — ABNORMAL HIGH (ref 11.5–15.5)
WBC: 4.3 10*3/uL (ref 4.0–10.5)
nRBC: 0 % (ref 0.0–0.2)

## 2018-11-08 LAB — MAGNESIUM: Magnesium: 2.1 mg/dL (ref 1.7–2.4)

## 2018-11-08 MED ORDER — PALONOSETRON HCL INJECTION 0.25 MG/5ML
0.2500 mg | Freq: Once | INTRAVENOUS | Status: AC
  Administered 2018-11-08: 0.25 mg via INTRAVENOUS
  Filled 2018-11-08: qty 5

## 2018-11-08 MED ORDER — SODIUM CHLORIDE 0.9 % IV SOLN
Freq: Once | INTRAVENOUS | Status: AC
  Administered 2018-11-08: 11:00:00 via INTRAVENOUS

## 2018-11-08 MED ORDER — SODIUM CHLORIDE 0.9% FLUSH
10.0000 mL | INTRAVENOUS | Status: DC | PRN
  Administered 2018-11-08: 10 mL
  Filled 2018-11-08: qty 10

## 2018-11-08 MED ORDER — DEXTROSE 50 % IV SOLN
50.0000 mL | Freq: Once | INTRAVENOUS | Status: AC
  Administered 2018-11-08: 50 mL via INTRAVENOUS
  Filled 2018-11-08: qty 50

## 2018-11-08 MED ORDER — SODIUM CHLORIDE 0.9 % IV SOLN
300.0000 mg/m2 | Freq: Once | INTRAVENOUS | Status: AC
  Administered 2018-11-08: 600 mg via INTRAVENOUS
  Filled 2018-11-08: qty 30

## 2018-11-08 MED ORDER — HEPARIN SOD (PORK) LOCK FLUSH 100 UNIT/ML IV SOLN
500.0000 [IU] | Freq: Once | INTRAVENOUS | Status: AC | PRN
  Administered 2018-11-08: 500 [IU]

## 2018-11-08 MED ORDER — BORTEZOMIB CHEMO SQ INJECTION 3.5 MG (2.5MG/ML)
1.3000 mg/m2 | Freq: Once | INTRAMUSCULAR | Status: AC
  Administered 2018-11-08: 2.5 mg via SUBCUTANEOUS
  Filled 2018-11-08: qty 1

## 2018-11-08 MED ORDER — INSULIN REGULAR HUMAN 100 UNIT/ML IJ SOLN
10.0000 [IU] | Freq: Once | INTRAMUSCULAR | Status: AC
  Administered 2018-11-08: 10 [IU] via INTRAVENOUS
  Filled 2018-11-08: qty 10

## 2018-11-08 MED ORDER — SODIUM POLYSTYRENE SULFONATE 15 GM/60ML PO SUSP
30.0000 g | Freq: Once | ORAL | Status: AC
  Administered 2018-11-08: 30 g via ORAL
  Filled 2018-11-08: qty 120

## 2018-11-08 MED ORDER — SODIUM CHLORIDE 0.9 % IV SOLN
20.0000 mg | Freq: Once | INTRAVENOUS | Status: AC
  Administered 2018-11-08: 20 mg via INTRAVENOUS
  Filled 2018-11-08: qty 20

## 2018-11-08 MED ORDER — EPOETIN ALFA-EPBX 10000 UNIT/ML IJ SOLN: 20000.0000 [IU] | Freq: Once | INTRAMUSCULAR | Status: DC

## 2018-11-08 NOTE — Progress Notes (Unsigned)
Referral sent to [facility] Dr Tiana Loft.  They will review and check insurance and will call pt with appt.  I spoke with [place] at [facility].

## 2018-11-08 NOTE — Assessment & Plan Note (Addendum)
1.  Kappa light chain myeloma: -Presentation with acute renal failure and hypercalcemia.  M spike was 0.4 g.  Immunofixation did not show any immunoglobulin heavy chain.  Kappa free light chains was 12,085.  Free light chain ratio was 1981 and LDH of 224. -Bone marrow biopsy on 06/16/2018 shows 87% atypical plasma cells.  Cytogenetics shows complex chromosome abnormalities.  Gain of 1 q. was seen.  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 - We reviewed his myeloma labs from 11/01/2018.  SPEP did not show any M spike.  Free light chain ratio has improved to 95.58.  Kappa light chains improved to 1347.  Creatinine also improved to 4.2. - I had a conversation with him about referral to bone marrow transplant center.  Initially he was reluctant but agreed.  We will make a referral to Dr. Norma Fredrickson at [facility]. - We reviewed his blood work.  He will proceed with his treatment today. - He reported some transient numbness on the left side of the face, lasting few minutes.  He had some slurring of the speech speech at the same time.  This has completely recovered.  Will obtain MRI of the head without contrast.  2.  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 -He is receiving Retacrit 20,000 units weekly.  Hemoglobin today is 10.2.  3.  Hyperkalemia: - Potassium today is 5.4.  He has poor adherence to low potassium foods. -He is also on Veltassa.  He does not take it on a regular basis. -We will treat his hyperkalemia today.  4.  ID prophylaxis: - He had skin rash with acyclovir.  He will continue Valtrex 500 mg twice daily.  5.  Low back pain: - He will continue Dilaudid 4 mg every 8 hours which is controlling his pain.  6.  Lower extremity edema: -He will continue torsemide 20 mg daily.

## 2018-11-08 NOTE — Patient Instructions (Addendum)
[facility] Discharge Instructions  You were seen today by Dr. Katragadda. He went over your recent lab results. He will see you back in 2 weeks for labs and follow up.   Thank you for choosing [facility] at [facility] to provide your oncology and hematology care.  To afford each patient quality time with our provider, please arrive at least 15 minutes before your scheduled appointment time.   If you have a lab appointment with the Cancer Center please come in thru the  Main Entrance and check in at the main information desk  You need to re-schedule your appointment should you arrive 10 or more minutes late.  We strive to give you quality time with our providers, and arriving late affects you and other patients whose appointments are after yours.  Also, if you no show three or more times for appointments you may be dismissed from the clinic at the providers discretion.     Again, thank you for choosing [facility].  Our hope is that these requests will decrease the amount of time that you wait before being seen by our physicians.       _____________________________________________________________  Should you have questions after your visit to [facility], please contact our office at [phone] between the hours of 8:00 a.m. and 4:30 p.m.  Voicemails left after 4:00 p.m. will not be returned until the following business day.  For prescription refill requests, have your pharmacy contact our office and allow 72 hours.    Cancer Center Support Programs:   > Cancer Support Group  2nd Tuesday of the month 1pm-2pm, Journey Room    

## 2018-11-08 NOTE — Progress Notes (Signed)
Labs reviewed with MD. Proceed today per MD.

## 2018-11-08 NOTE — Progress Notes (Signed)
11/08/18 K+ level = 5.4  Received orders for:  Regular insulin 10 units Intravenously x 1  Dextrose 50% 50 ml amp Intravenously x 1 Kayexalate 30 gm Oral x 1   All to be administered today.  T.O. Dr Beckey Downing LPN/Sway Guttierrez Ronnald Ramp, PharmD

## 2018-11-08 NOTE — Progress Notes (Signed)
[name], [place] [id number]   CLINIC:  Medical Oncology/Hematology  PCP:  Rosita Fire, MD [place] [address] [phone]   REASON FOR VISIT: Follow-up for Multiple myeloma    BRIEF ONCOLOGIC HISTORY:  Oncology History  Kappa light chain myeloma ([name])  06/16/2018 Initial Diagnosis   Kappa light chain myeloma ([name])   06/17/2018 -  Chemotherapy   The patient had palonosetron (ALOXI) injection 0.25 mg, 0.25 mg, Intravenous,  Once, 21 of 24 cycles Administration: 0.25 mg (06/17/2018), 0.25 mg (06/23/2018), 0.25 mg (06/30/2018), 0.25 mg (07/07/2018), 0.25 mg (07/14/2018), 0.25 mg (07/22/2018), 0.25 mg (07/29/2018), 0.25 mg (08/06/2018), 0.25 mg (08/13/2018), 0.25 mg (08/20/2018), 0.25 mg (08/27/2018), 0.25 mg (09/06/2018), 0.25 mg (09/13/2018), 0.25 mg (09/20/2018), 0.25 mg (09/27/2018), 0.25 mg (10/04/2018), 0.25 mg (10/11/2018), 0.25 mg (10/18/2018), 0.25 mg (10/25/2018), 0.25 mg (11/01/2018), 0.25 mg (11/08/2018) bortezomib SQ (VELCADE) chemo injection 2.5 mg, 1.3 mg/m2 = 2.5 mg, Subcutaneous,  Once, 21 of 24 cycles Administration: 2.5 mg (06/17/2018), 2.5 mg (06/23/2018), 2.5 mg (06/30/2018), 2.5 mg (07/07/2018), 2.5 mg (07/14/2018), 2.5 mg (07/22/2018), 2.5 mg (07/29/2018), 2.5 mg (08/06/2018), 2.5 mg (08/13/2018), 2.5 mg (08/20/2018), 2.5 mg (08/27/2018), 2.5 mg (09/06/2018), 2.5 mg (09/13/2018), 2.5 mg (09/20/2018), 2.5 mg (09/27/2018), 2.5 mg (10/04/2018), 2.5 mg (10/11/2018), 2.5 mg (10/18/2018), 2.5 mg (10/25/2018), 2.5 mg (11/01/2018), 2.5 mg (11/08/2018) cyclophosphamide (CYTOXAN) 300 mg in sodium chloride 0.9 % 250 mL chemo infusion, 300 mg (100 % of original dose 300 mg), Intravenous,  Once, 21 of 24 cycles Dose modification: 300 mg (original dose 300 mg, Cycle 1, Reason: Change in SCr/CrCl), 500 mg (original dose 500 mg, Cycle 2, Reason: Provider Judgment), 500 mg (original dose 500 mg, Cycle 3, Reason: Change in SCr/CrCl) Administration: 300 mg (06/17/2018), 500 mg  (06/23/2018), 500 mg (06/30/2018), 600 mg (07/07/2018), 600 mg (07/14/2018), 600 mg (07/22/2018), 600 mg (07/29/2018), 600 mg (08/06/2018), 600 mg (08/13/2018), 600 mg (08/20/2018), 600 mg (08/27/2018), 600 mg (09/06/2018), 600 mg (09/13/2018), 600 mg (09/20/2018), 600 mg (09/27/2018), 600 mg (10/04/2018), 600 mg (10/11/2018), 600 mg (10/18/2018), 600 mg (10/25/2018), 600 mg (11/01/2018), 600 mg (11/08/2018)  for chemotherapy treatment.       INTERVAL HISTORY:  [name] 58 y.o. male seen for follow-up of kappa light chain myeloma.  He is tolerating bortezomib and cyclophosphamide on a weekly basis very well.  Appetite and energy levels are 75%.  Denies any tingling or numbness in the extremities.  Denies any fevers, or chills.  No ER visits or hospitalizations.  He reportedly had some numbness on the left side of the face on Friday which lasted only few minutes.  He also had transient slurring of the speech.  He did not seek any medical attention.  Speech normalized within a few minutes.  Denies any weakness in the body parts.  Back pain is controlled well with the current regimen.     REVIEW OF SYSTEMS:  Review of Systems  Musculoskeletal: Positive for back pain.  All other systems reviewed and are negative.    PAST MEDICAL/SURGICAL HISTORY:  Past Medical History:  Diagnosis Date  . Allergy   . Arthritis    neck and back  . Blood transfusion without reported diagnosis   . BPH (benign prostatic hyperplasia)   . 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 Chronic kidney disease    kidney stones  . Left lumbar radiculopathy 06/12/2016  . 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 Medical history non-contributory  Pt has scattered thoughts and uncertain of past medical history  . 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 Stones, urinary tract   . Substance abuse ([place])    prescribed oxydcodone- 10 years   Past Surgical History:  Procedure Laterality Date  . BACK SURGERY     x5  . CERVICAL DISC SURGERY     x2  .  COLONOSCOPY WITH PROPOFOL N/A 08/11/2016   Dr. Gala Romney: non-bleeding internal hemorrhoids, one 4 mm hyperplastic rectal polyp, diverticulosis in entire examined colon  . POLYPECTOMY  08/11/2016   Procedure: POLYPECTOMY;  Surgeon: [name], MD;  Location: [facility] ENDO SUITE;  Service: Endoscopy;;  colon  . PORTACATH PLACEMENT Left 09/20/2018   Procedure: INSERTION PORT-A-CATH (catheter attached left subclavian);  Surgeon: Virl Cagey, MD;  Location: [facility] ORS;  Service: General;  Laterality: Left;  . testicular cancer  2004  . TONSILLECTOMY       SOCIAL HISTORY:  Social History   Socioeconomic History  . Marital status: Single    Spouse name: Not on file  . Number of children: 1  . Years of education: 55  . Highest education level: Not on file  Occupational History  . Occupation: retired    Comment: Psychologist, prison and probation services  . Occupation: disabled  Social Needs  . Financial resource strain: Not on file  . Food insecurity    Worry: Not on file    Inability: Not on file  . Transportation needs    Medical: Not on file    Non-medical: Not on file  Tobacco Use  . Smoking status: Current Every Day Smoker    Packs/day: 1.00    Years: 40.00    Pack years: 40.00    Types: Cigarettes    Start date: 03/31/1974  . Smokeless tobacco: Never Used  Substance and Sexual Activity  . Alcohol use: Yes    Comment: Occasional  . Drug use: No    Types: Cocaine    Comment: Remote hx of cocaine, quit 1989  . Sexual activity: Yes  Lifestyle  . Physical activity    Days per week: Not on file    Minutes per session: Not on file  . Stress: Not on file  Relationships  . Social Herbalist on phone: Not on file    Gets together: Not on file    Attends religious service: Not on file    Active member of club or organization: Not on file    Attends meetings of clubs or organizations: Not on file    Relationship status: Not on file  . Intimate partner violence    Fear of current or ex partner: No     Emotionally abused: No    Physically abused: No    Forced sexual activity: No  Other Topics Concern  . Not on file  Social History Narrative   [employer] for 12 years   [employer] for 46 years      Never married   One daughter      Lives alone   Retail banker   Right-handed   Occasional caffeine use          FAMILY HISTORY:  Family History  Problem Relation Age of Onset  . COPD Mother   . Heart disease Mother   . Other Father        Never knew his father.  . Thyroid disease Sister   . Arthritis Sister   . Heart disease Sister  bypass  . Colon cancer Neg Hx     CURRENT MEDICATIONS:  Outpatient Encounter Medications as of 11/08/2018  Medication Sig  . bortezomib IV (VELCADE) 3.5 MG injection Inject 1.3 mg/m2 into the vein once a week.   . calcitRIOL (ROCALTROL) 0.5 MCG capsule Take 0.5 mcg by mouth daily.   . CYCLOPHOSPHAMIDE IV Inject into the vein once a week.  . diphenhydrAMINE (BENADRYL) 25 MG tablet Take 1 tablet (25 mg total) by mouth as needed for itching.  Marland Kitchen Epoetin Alfa (PROCRIT IJ) Inject as directed. Unsure of dosage- gets this once weekly  . HYDROmorphone (DILAUDID) 4 MG tablet Take 1 tablet (4 mg total) by mouth every 8 (eight) hours as needed for severe pain.  . Misc. Devices MISC Please provide patient with rollaider.  Dx: multiple myeloma C90.0  . Patiromer Sorbitex Calcium (VELTASSA PO) Take by mouth 2 (two) times a week.   . senna (SENOKOT) 8.6 MG tablet Take 1 tablet by mouth 2 (two) times daily.   . sodium bicarbonate 650 MG tablet Take 1,300 mg by mouth 2 (two) times daily.  Marland Kitchen torsemide (DEMADEX) 20 MG tablet TAKING 1 TABLET DAILY, AND 2 TABLETS ONLY IF HIS ANKLES ARE REALLY SWOLLEN  . valACYclovir (VALTREX) 500 MG tablet Take 1 tablet (500 mg total) by mouth 2 (two) times daily.   No facility-administered encounter medications on file as of 11/08/2018.     ALLERGIES:  Allergies  Allergen Reactions  .  Oxycodone-Acetaminophen   . Acetaminophen Nausea Only and Other (See Comments)    Elevated liver enzymes  . Cymbalta [Duloxetine Hcl] Swelling    Facial swelling  . Diclofenac Sodium Swelling       . Imodium [Loperamide] Swelling    facial  . Lyrica [Pregabalin] Swelling  . Morphine Other (See Comments)    Bradycardia   . Vioxx [Rofecoxib] Swelling  . Aleve [Naproxen] Swelling    eye     PHYSICAL EXAM:  ECOG Performance status: 1  Vitals:   11/08/18 0900  BP: 135/88  Pulse: 87  Resp: 16  Temp: (!) 97.5 F (36.4 C)  SpO2: 100%   Filed Weights   11/08/18 0900  Weight: 170 lb 4 oz (77.2 kg)    Physical Exam Vitals signs reviewed.  Constitutional:      Appearance: Normal appearance. He is normal weight.  Cardiovascular:     Rate and Rhythm: Normal rate and regular rhythm.     Heart sounds: Normal heart sounds.  Pulmonary:     Effort: Pulmonary effort is normal.     Breath sounds: Normal breath sounds.  Abdominal:     General: Bowel sounds are normal.     Palpations: Abdomen is soft.  Musculoskeletal: Normal range of motion.     Right lower leg: No edema.     Left lower leg: No edema.  Skin:    General: Skin is warm and dry.  Neurological:     Mental Status: He is alert and oriented to person, place, and time. Mental status is at baseline.  Psychiatric:        Mood and Affect: Mood normal.        Behavior: Behavior normal.      LABORATORY DATA:  I have reviewed the labs as listed.  CBC    Component Value Date/Time   WBC 4.3 11/08/2018 0902   RBC 3.24 (L) 11/08/2018 0902   HGB 10.2 (L) 11/08/2018 0902   HCT 32.9 (L) 11/08/2018 1962  PLT 228 11/08/2018 0902   MCV 101.5 (H) 11/08/2018 0902   MCH 31.5 11/08/2018 0902   MCHC 31.0 11/08/2018 0902   RDW 18.4 (H) 11/08/2018 0902   LYMPHSABS 0.9 11/08/2018 0902   MONOABS 1.0 11/08/2018 0902   EOSABS 0.2 11/08/2018 0902   BASOSABS 0.0 11/08/2018 0902   CMP Latest Ref Rng & Units 11/08/2018 11/01/2018  10/25/2018  Glucose 70 - 99 mg/dL 82 92 91  BUN 6 - 20 mg/dL 49(H) 49(H) 54(H)  Creatinine 0.61 - 1.24 mg/dL 4.22(H) 4.49(H) 4.35(H)  Sodium 135 - 145 mmol/L 138 140 138  Potassium 3.5 - 5.1 mmol/L 5.4(H) 5.3(H) 5.1  Chloride 98 - 111 mmol/L 108 108 107  CO2 22 - 32 mmol/L 21(L) 23 22  Calcium 8.9 - 10.3 mg/dL 7.7(L) 8.0(L) 7.9(L)  Total Protein 6.5 - 8.1 g/dL 6.5 6.4(L) 6.1(L)  Total Bilirubin 0.3 - 1.2 mg/dL 0.7 0.5 0.5  Alkaline Phos 38 - 126 U/L 66 72 73  AST 15 - 41 U/L 13(L) 13(L) 18  ALT 0 - 44 U/L _0 I have personally reviewed his scans and discussed with him.     ASSESSMENT & PLAN:   Kappa light chain myeloma ([place]) 1.  Kappa light chain myeloma: -Presentation with acute renal failure and hypercalcemia.  M spike was 0.4 g.  Immunofixation did not show any immunoglobulin heavy chain.  Kappa free light chains was 12,085.  Free light chain ratio was 1981 and LDH of 224. -Bone marrow biopsy on 06/16/2018 shows 87% atypical plasma cells.  Cytogenetics shows complex chromosome abnormalities.  Gain of 1 q. was seen.  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 - We reviewed his myeloma labs from 11/01/2018.  SPEP did not show any M spike.  Free light chain ratio has improved to 95.58.  Kappa light chains improved to 1347.  Creatinine also improved to 4.2. - I had a conversation with him about referral to bone marrow transplant [place].  Initially he was reluctant but agreed.  We will make a referral to Dr. Norma Fredrickson at [facility]. - We reviewed his blood work.  He will proceed with his treatment today. - He reported some transient numbness on the left side of the face, lasting few minutes.  He had some slurring of the speech speech at the same time.  This has completely recovered.  Will obtain MRI of the head without contrast.  2.  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 -He is receiving  Retacrit 20,000 units weekly.  Hemoglobin today is 10.2.  3.  Hyperkalemia: - Potassium today is 5.4.  He has poor adherence to low potassium foods. -He is also on Veltassa.  He does not take it on a regular basis. -We will treat his hyperkalemia today.  4.  ID prophylaxis: - He had skin rash with acyclovir.  He will continue Valtrex 500 mg twice daily.  5.  Low back pain: - He will continue Dilaudid 4 mg every 8 hours which is controlling his pain.  6.  Lower extremity edema: -He will continue torsemide 20 mg daily.  Total time spent is 40 minutes with more than 50% of the time spent face-to-face discussing treatment plan, counseling and coordination of care.    Orders placed this encounter:  Orders Placed This Encounter  Procedures  . MR Brain Wo Contrast      Dirk Dress  [place], [place] [phone]

## 2018-11-08 NOTE — Patient Instructions (Signed)
[facility] Discharge Instructions for Patients Receiving Chemotherapy  Today you received the following chemotherapy agents   To help prevent nausea and vomiting after your treatment, we encourage you to take your nausea medication   If you develop nausea and vomiting that is not controlled by your nausea medication, call the clinic.   BELOW ARE SYMPTOMS THAT SHOULD BE REPORTED IMMEDIATELY:  *FEVER GREATER THAN 100.5 F  *CHILLS WITH OR WITHOUT FEVER  NAUSEA AND VOMITING THAT IS NOT CONTROLLED WITH YOUR NAUSEA MEDICATION  *UNUSUAL SHORTNESS OF BREATH  *UNUSUAL BRUISING OR BLEEDING  TENDERNESS IN MOUTH AND THROAT WITH OR WITHOUT PRESENCE OF ULCERS  *URINARY PROBLEMS  *BOWEL PROBLEMS  UNUSUAL RASH Items with * indicate a potential emergency and should be followed up as soon as possible.  Feel free to call the clinic should you have any questions or concerns. The clinic phone number is [phone].  Please show the CHEMO ALERT CARD at check-in to the Emergency Department and triage nurse.   

## 2018-11-11 ENCOUNTER — Other Ambulatory Visit (HOSPITAL_COMMUNITY): Payer: Self-pay

## 2018-11-11 DIAGNOSIS — N185 Chronic kidney disease, stage 5: Secondary | ICD-10-CM

## 2018-11-11 DIAGNOSIS — C9000 Multiple myeloma not having achieved remission: Secondary | ICD-10-CM

## 2018-11-15 ENCOUNTER — Ambulatory Visit (HOSPITAL_COMMUNITY): Payer: Medicare Other | Admitting: Hematology

## 2018-11-15 ENCOUNTER — Encounter (HOSPITAL_COMMUNITY): Payer: Self-pay

## 2018-11-15 ENCOUNTER — Inpatient Hospital Stay (HOSPITAL_COMMUNITY): Payer: Medicare Other

## 2018-11-15 ENCOUNTER — Ambulatory Visit (HOSPITAL_COMMUNITY): Payer: Medicare Other

## 2018-11-15 ENCOUNTER — Other Ambulatory Visit (HOSPITAL_COMMUNITY): Payer: Medicare Other

## 2018-11-15 ENCOUNTER — Other Ambulatory Visit: Payer: Self-pay

## 2018-11-15 VITALS — BP 121/78 | HR 73 | Temp 98.1°F | Resp 18 | Wt 169.8 lb

## 2018-11-15 DIAGNOSIS — N185 Chronic kidney disease, stage 5: Secondary | ICD-10-CM

## 2018-11-15 DIAGNOSIS — C9000 Multiple myeloma not having achieved remission: Secondary | ICD-10-CM

## 2018-11-15 DIAGNOSIS — Z5112 Encounter for antineoplastic immunotherapy: Secondary | ICD-10-CM | POA: Diagnosis not present

## 2018-11-15 LAB — CBC WITH DIFFERENTIAL/PLATELET
Abs Immature Granulocytes: 0.01 10*3/uL (ref 0.00–0.07)
Basophils Absolute: 0 10*3/uL (ref 0.0–0.1)
Basophils Relative: 0 %
Eosinophils Absolute: 0.2 10*3/uL (ref 0.0–0.5)
Eosinophils Relative: 4 %
HCT: 31.6 % — ABNORMAL LOW (ref 39.0–52.0)
Hemoglobin: 10.1 g/dL — ABNORMAL LOW (ref 13.0–17.0)
Immature Granulocytes: 0 %
Lymphocytes Relative: 21 %
Lymphs Abs: 0.8 10*3/uL (ref 0.7–4.0)
MCH: 32.4 pg (ref 26.0–34.0)
MCHC: 32 g/dL (ref 30.0–36.0)
MCV: 101.3 fL — ABNORMAL HIGH (ref 80.0–100.0)
Monocytes Absolute: 0.7 10*3/uL (ref 0.1–1.0)
Monocytes Relative: 17 %
Neutro Abs: 2.2 10*3/uL (ref 1.7–7.7)
Neutrophils Relative %: 58 %
Platelets: 176 10*3/uL (ref 150–400)
RBC: 3.12 MIL/uL — ABNORMAL LOW (ref 4.22–5.81)
RDW: 18.3 % — ABNORMAL HIGH (ref 11.5–15.5)
WBC: 3.9 10*3/uL — ABNORMAL LOW (ref 4.0–10.5)
nRBC: 0 % (ref 0.0–0.2)

## 2018-11-15 LAB — COMPREHENSIVE METABOLIC PANEL
ALT: 9 U/L (ref 0–44)
AST: 13 U/L — ABNORMAL LOW (ref 15–41)
Albumin: 4 g/dL (ref 3.5–5.0)
Alkaline Phosphatase: 65 U/L (ref 38–126)
Anion gap: 10 (ref 5–15)
BUN: 54 mg/dL — ABNORMAL HIGH (ref 6–20)
CO2: 20 mmol/L — ABNORMAL LOW (ref 22–32)
Calcium: 7.4 mg/dL — ABNORMAL LOW (ref 8.9–10.3)
Chloride: 109 mmol/L (ref 98–111)
Creatinine, Ser: 4.18 mg/dL — ABNORMAL HIGH (ref 0.61–1.24)
GFR calc Af Amer: 17 mL/min — ABNORMAL LOW (ref >60)
GFR calc non Af Amer: 14 mL/min — ABNORMAL LOW (ref >60)
Glucose, Bld: 88 mg/dL (ref 70–99)
Potassium: 4.9 mmol/L (ref 3.5–5.1)
Sodium: 139 mmol/L (ref 135–145)
Total Bilirubin: 0.5 mg/dL (ref 0.3–1.2)
Total Protein: 6.5 g/dL (ref 6.5–8.1)

## 2018-11-15 LAB — MAGNESIUM: Magnesium: 2 mg/dL (ref 1.7–2.4)

## 2018-11-15 MED ORDER — BORTEZOMIB CHEMO SQ INJECTION 3.5 MG (2.5MG/ML)
1.3000 mg/m2 | Freq: Once | INTRAMUSCULAR | Status: AC
  Administered 2018-11-15: 2.5 mg via SUBCUTANEOUS
  Filled 2018-11-15: qty 1

## 2018-11-15 MED ORDER — SODIUM CHLORIDE 0.9% FLUSH
10.0000 mL | INTRAVENOUS | Status: DC | PRN
  Administered 2018-11-15: 10 mL
  Filled 2018-11-15: qty 10

## 2018-11-15 MED ORDER — EPOETIN ALFA-EPBX 10000 UNIT/ML IJ SOLN
20000.0000 [IU] | Freq: Once | INTRAMUSCULAR | Status: AC
  Administered 2018-11-15: 20000 [IU] via SUBCUTANEOUS
  Filled 2018-11-15: qty 2

## 2018-11-15 MED ORDER — HEPARIN SOD (PORK) LOCK FLUSH 100 UNIT/ML IV SOLN
500.0000 [IU] | Freq: Once | INTRAVENOUS | Status: AC | PRN
  Administered 2018-11-15: 500 [IU]

## 2018-11-15 MED ORDER — SODIUM CHLORIDE 0.9 % IV SOLN
300.0000 mg/m2 | Freq: Once | INTRAVENOUS | Status: AC
  Administered 2018-11-15: 12:00:00 600 mg via INTRAVENOUS
  Filled 2018-11-15: qty 30

## 2018-11-15 MED ORDER — SODIUM CHLORIDE 0.9 % IV SOLN
20.0000 mg | Freq: Once | INTRAVENOUS | Status: AC
  Administered 2018-11-15: 11:00:00 20 mg via INTRAVENOUS
  Filled 2018-11-15: qty 20

## 2018-11-15 MED ORDER — SODIUM CHLORIDE 0.9 % IV SOLN
Freq: Once | INTRAVENOUS | Status: AC
  Administered 2018-11-15: 10:00:00 via INTRAVENOUS

## 2018-11-15 MED ORDER — PALONOSETRON HCL INJECTION 0.25 MG/5ML
0.2500 mg | Freq: Once | INTRAVENOUS | Status: AC
  Administered 2018-11-15: 0.25 mg via INTRAVENOUS
  Filled 2018-11-15: qty 5

## 2018-11-15 NOTE — Patient Instructions (Signed)
[facility] Discharge Instructions for Patients Receiving Chemotherapy  Today you received the following chemotherapy agents   To help prevent nausea and vomiting after your treatment, we encourage you to take your nausea medication   If you develop nausea and vomiting that is not controlled by your nausea medication, call the clinic.   BELOW ARE SYMPTOMS THAT SHOULD BE REPORTED IMMEDIATELY:  *FEVER GREATER THAN 100.5 F  *CHILLS WITH OR WITHOUT FEVER  NAUSEA AND VOMITING THAT IS NOT CONTROLLED WITH YOUR NAUSEA MEDICATION  *UNUSUAL SHORTNESS OF BREATH  *UNUSUAL BRUISING OR BLEEDING  TENDERNESS IN MOUTH AND THROAT WITH OR WITHOUT PRESENCE OF ULCERS  *URINARY PROBLEMS  *BOWEL PROBLEMS  UNUSUAL RASH Items with * indicate a potential emergency and should be followed up as soon as possible.  Feel free to call the clinic should you have any questions or concerns. The clinic phone number is [phone].  Please show the CHEMO ALERT CARD at check-in to the Emergency Department and triage nurse.   

## 2018-11-15 NOTE — Patient Instructions (Signed)
[facility] at [facility] Discharge Instructions  Labs drawn from portacath today   Thank you for choosing [facility] at [facility] to provide your oncology and hematology care.  To afford each patient quality time with our provider, please arrive at least 15 minutes before your scheduled appointment time.   If you have a lab appointment with the Cancer Center please come in thru the  Main Entrance and check in at the main information desk  You need to re-schedule your appointment should you arrive 10 or more minutes late.  We strive to give you quality time with our providers, and arriving late affects you and other patients whose appointments are after yours.  Also, if you no show three or more times for appointments you may be dismissed from the clinic at the providers discretion.     Again, thank you for choosing [facility].  Our hope is that these requests will decrease the amount of time that you wait before being seen by our physicians.       _____________________________________________________________  Should you have questions after your visit to [facility], please contact our office at [phone] between the hours of 8:00 a.m. and 4:30 p.m.  Voicemails left after 4:00 p.m. will not be returned until the following business day.  For prescription refill requests, have your pharmacy contact our office and allow 72 hours.    Cancer Center Support Programs:   > Cancer Support Group  2nd Tuesday of the month 1pm-2pm, Journey Room   

## 2018-11-15 NOTE — Progress Notes (Signed)
Pt presents today for treatment. VS within parameter for treatment. MAR reviewed. Labs pending. Pt states his energy level has decreased and he has muscle weakness.   Labs reviewed with RNester NP. VO received proceed with treatment. Creat. 4.18 and NP aware.   Treatment given today per MD orders. Tolerated infusion without adverse affects. Vital signs stable. No complaints at this time. Discharged from clinic ambulatory. F/U with [facility] as scheduled.

## 2018-11-18 ENCOUNTER — Other Ambulatory Visit: Payer: Self-pay

## 2018-11-18 ENCOUNTER — Ambulatory Visit (HOSPITAL_COMMUNITY)
Admission: RE | Admit: 2018-11-18 | Discharge: 2018-11-18 | Disposition: A | Discharge status: Home / Self Care | Payer: Medicare Other | Source: Ambulatory Visit | Attending: Hematology | Admitting: Hematology

## 2018-11-18 DIAGNOSIS — R200 Anesthesia of skin: Secondary | ICD-10-CM | POA: Diagnosis not present

## 2018-11-22 ENCOUNTER — Inpatient Hospital Stay (HOSPITAL_COMMUNITY): Payer: Medicare Other

## 2018-11-22 ENCOUNTER — Other Ambulatory Visit: Payer: Self-pay

## 2018-11-22 ENCOUNTER — Encounter (HOSPITAL_COMMUNITY): Payer: Self-pay | Admitting: Hematology

## 2018-11-22 ENCOUNTER — Inpatient Hospital Stay (HOSPITAL_BASED_OUTPATIENT_CLINIC_OR_DEPARTMENT_OTHER): Payer: Medicare Other | Admitting: Hematology

## 2018-11-22 VITALS — BP 129/66 | HR 65 | Temp 97.4°F | Resp 18

## 2018-11-22 DIAGNOSIS — Z5112 Encounter for antineoplastic immunotherapy: Secondary | ICD-10-CM | POA: Diagnosis not present

## 2018-11-22 DIAGNOSIS — C9000 Multiple myeloma not having achieved remission: Secondary | ICD-10-CM

## 2018-11-22 DIAGNOSIS — N185 Chronic kidney disease, stage 5: Secondary | ICD-10-CM

## 2018-11-22 LAB — CBC WITH DIFFERENTIAL/PLATELET
Abs Immature Granulocytes: 0.02 10*3/uL (ref 0.00–0.07)
Basophils Absolute: 0 10*3/uL (ref 0.0–0.1)
Basophils Relative: 0 %
Eosinophils Absolute: 0.3 10*3/uL (ref 0.0–0.5)
Eosinophils Relative: 5 %
HCT: 31.8 % — ABNORMAL LOW (ref 39.0–52.0)
Hemoglobin: 10.2 g/dL — ABNORMAL LOW (ref 13.0–17.0)
Immature Granulocytes: 0 %
Lymphocytes Relative: 20 %
Lymphs Abs: 1 10*3/uL (ref 0.7–4.0)
MCH: 32.8 pg (ref 26.0–34.0)
MCHC: 32.1 g/dL (ref 30.0–36.0)
MCV: 102.3 fL — ABNORMAL HIGH (ref 80.0–100.0)
Monocytes Absolute: 0.8 10*3/uL (ref 0.1–1.0)
Monocytes Relative: 15 %
Neutro Abs: 2.9 10*3/uL (ref 1.7–7.7)
Neutrophils Relative %: 60 %
Platelets: 165 10*3/uL (ref 150–400)
RBC: 3.11 MIL/uL — ABNORMAL LOW (ref 4.22–5.81)
RDW: 18.4 % — ABNORMAL HIGH (ref 11.5–15.5)
WBC: 4.9 10*3/uL (ref 4.0–10.5)
nRBC: 0 % (ref 0.0–0.2)

## 2018-11-22 LAB — COMPREHENSIVE METABOLIC PANEL
ALT: 8 U/L (ref 0–44)
AST: 14 U/L — ABNORMAL LOW (ref 15–41)
Albumin: 3.8 g/dL (ref 3.5–5.0)
Alkaline Phosphatase: 67 U/L (ref 38–126)
Anion gap: 7 (ref 5–15)
BUN: 45 mg/dL — ABNORMAL HIGH (ref 6–20)
CO2: 22 mmol/L (ref 22–32)
Calcium: 7.9 mg/dL — ABNORMAL LOW (ref 8.9–10.3)
Chloride: 111 mmol/L (ref 98–111)
Creatinine, Ser: 4.25 mg/dL — ABNORMAL HIGH (ref 0.61–1.24)
GFR calc Af Amer: 16 mL/min — ABNORMAL LOW (ref >60)
GFR calc non Af Amer: 14 mL/min — ABNORMAL LOW (ref >60)
Glucose, Bld: 118 mg/dL — ABNORMAL HIGH (ref 70–99)
Potassium: 5.2 mmol/L — ABNORMAL HIGH (ref 3.5–5.1)
Sodium: 140 mmol/L (ref 135–145)
Total Bilirubin: 0.6 mg/dL (ref 0.3–1.2)
Total Protein: 6.4 g/dL — ABNORMAL LOW (ref 6.5–8.1)

## 2018-11-22 LAB — MAGNESIUM: Magnesium: 2 mg/dL (ref 1.7–2.4)

## 2018-11-22 MED ORDER — SODIUM CHLORIDE 0.9 % IV SOLN
300.0000 mg/m2 | Freq: Once | INTRAVENOUS | Status: AC
  Administered 2018-11-22: 600 mg via INTRAVENOUS
  Filled 2018-11-22: qty 30

## 2018-11-22 MED ORDER — SODIUM CHLORIDE 0.9 % IV SOLN
20.0000 mg | Freq: Once | INTRAVENOUS | Status: AC
  Administered 2018-11-22: 20 mg via INTRAVENOUS
  Filled 2018-11-22: qty 20

## 2018-11-22 MED ORDER — BORTEZOMIB CHEMO SQ INJECTION 3.5 MG (2.5MG/ML)
1.3000 mg/m2 | Freq: Once | INTRAMUSCULAR | Status: AC
  Administered 2018-11-22: 2.5 mg via SUBCUTANEOUS
  Filled 2018-11-22: qty 1

## 2018-11-22 MED ORDER — SODIUM CHLORIDE 0.9 % IV SOLN
Freq: Once | INTRAVENOUS | Status: AC
  Administered 2018-11-22: 09:00:00 via INTRAVENOUS

## 2018-11-22 MED ORDER — EPOETIN ALFA-EPBX 10000 UNIT/ML IJ SOLN
20000.0000 [IU] | Freq: Once | INTRAMUSCULAR | Status: AC
  Administered 2018-11-22: 20000 [IU] via SUBCUTANEOUS
  Filled 2018-11-22: qty 2

## 2018-11-22 MED ORDER — SODIUM CHLORIDE 0.9% FLUSH
10.0000 mL | INTRAVENOUS | Status: DC | PRN
  Administered 2018-11-22: 10 mL
  Filled 2018-11-22: qty 10

## 2018-11-22 MED ORDER — PALONOSETRON HCL INJECTION 0.25 MG/5ML
0.2500 mg | Freq: Once | INTRAVENOUS | Status: AC
  Administered 2018-11-22: 0.25 mg via INTRAVENOUS
  Filled 2018-11-22: qty 5

## 2018-11-22 MED ORDER — HEPARIN SOD (PORK) LOCK FLUSH 100 UNIT/ML IV SOLN
500.0000 [IU] | Freq: Once | INTRAVENOUS | Status: AC | PRN
  Administered 2018-11-22: 500 [IU]

## 2018-11-22 NOTE — Assessment & Plan Note (Signed)
1.  Kappa light chain myeloma: -Presentation with acute renal failure and hypercalcemia.  M spike was 0.4 g.  Immunofixation did not show any immunoglobulin heavy chain.  Kappa free light chains was 12,085.  Free light chain ratio was 1981 and LDH of 224. -Bone marrow biopsy on 06/16/2018 shows 87% atypical plasma cells.  Cytogenetics shows complex chromosome abnormalities.  Gain of 1 q. was seen.  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 - Myeloma labs from 11/01/2018 shows no M spike.  Free light chain ratio improved to 95.5.  Kappa light chains improved to 1347.   - I talked to him about bone marrow transplant.  We have made a referral to Dr. Norma Fredrickson at [facility]. - He complained of left-sided face numbness which was transient.  We did MRI of the brain on 11/18/2018 which showed minimal chronic changes in the white matter typical for age.  He no longer has numbness. - We have reviewed his labs.  He will proceed with next treatment today.  I will see him back in 2 weeks for follow-up.  We will plan to repeat myeloma labs next week.  2.  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 -He will continue Retacrit 20,000 units weekly.  3.  Hyperkalemia: - Potassium today is 5.2.  He has poor adherence to low potassium diet. -He is not taking Veltassa on a regular basis.  He apparently does not like the taste of it.  I have counseled him to take it every day. - We will give him Kayexalate today.  4.  ID prophylaxis: - He had skin rash with acyclovir.  He will continue Valtrex 500 mg twice daily.  5.  Low back pain: - He will continue Dilaudid 4 mg every 8 hours which is controlling his pain.  6.  Lower extremity edema: -He will continue torsemide 20 mg daily.

## 2018-11-22 NOTE — Patient Instructions (Addendum)
[facility] at [facility] Discharge Instructions  You were seen today by Dr. Katragadda. He went over your recent lab results. He will see you back in 2 weeks for labs and follow up.   Thank you for choosing [facility] at [facility] to provide your oncology and hematology care.  To afford each patient quality time with our provider, please arrive at least 15 minutes before your scheduled appointment time.   If you have a lab appointment with the Cancer Center please come in thru the  Main Entrance and check in at the main information desk  You need to re-schedule your appointment should you arrive 10 or more minutes late.  We strive to give you quality time with our providers, and arriving late affects you and other patients whose appointments are after yours.  Also, if you no show three or more times for appointments you may be dismissed from the clinic at the providers discretion.     Again, thank you for choosing [facility].  Our hope is that these requests will decrease the amount of time that you wait before being seen by our physicians.       _____________________________________________________________  Should you have questions after your visit to [facility], please contact our office at [phone] between the hours of 8:00 a.m. and 4:30 p.m.  Voicemails left after 4:00 p.m. will not be returned until the following business day.  For prescription refill requests, have your pharmacy contact our office and allow 72 hours.    Cancer Center Support Programs:   > Cancer Support Group  2nd Tuesday of the month 1pm-2pm, Journey Room    

## 2018-11-22 NOTE — Progress Notes (Signed)
[place], [place] [address]   CLINIC:  Medical Oncology/Hematology  PCP:  Rosita Fire, MD [place] [id number] [phone]   REASON FOR VISIT: Follow-up for Multiple myeloma    BRIEF ONCOLOGIC HISTORY:  Oncology History  Kappa light chain myeloma ([place])  06/16/2018 Initial Diagnosis   Kappa light chain myeloma ([place])   06/17/2018 -  Chemotherapy   The patient had palonosetron (ALOXI) injection 0.25 mg, 0.25 mg, Intravenous,  Once, 23 of 26 cycles Administration: 0.25 mg (06/17/2018), 0.25 mg (06/23/2018), 0.25 mg (06/30/2018), 0.25 mg (07/07/2018), 0.25 mg (07/14/2018), 0.25 mg (07/22/2018), 0.25 mg (07/29/2018), 0.25 mg (08/06/2018), 0.25 mg (08/13/2018), 0.25 mg (08/20/2018), 0.25 mg (08/27/2018), 0.25 mg (09/06/2018), 0.25 mg (09/13/2018), 0.25 mg (09/20/2018), 0.25 mg (09/27/2018), 0.25 mg (10/04/2018), 0.25 mg (10/11/2018), 0.25 mg (10/18/2018), 0.25 mg (10/25/2018), 0.25 mg (11/01/2018), 0.25 mg (11/08/2018), 0.25 mg (11/15/2018), 0.25 mg (11/22/2018) bortezomib SQ (VELCADE) chemo injection 2.5 mg, 1.3 mg/m2 = 2.5 mg, Subcutaneous,  Once, 23 of 26 cycles Administration: 2.5 mg (06/17/2018), 2.5 mg (06/23/2018), 2.5 mg (06/30/2018), 2.5 mg (07/07/2018), 2.5 mg (07/14/2018), 2.5 mg (07/22/2018), 2.5 mg (07/29/2018), 2.5 mg (08/06/2018), 2.5 mg (08/13/2018), 2.5 mg (08/20/2018), 2.5 mg (08/27/2018), 2.5 mg (09/06/2018), 2.5 mg (09/13/2018), 2.5 mg (09/20/2018), 2.5 mg (09/27/2018), 2.5 mg (10/04/2018), 2.5 mg (10/11/2018), 2.5 mg (10/18/2018), 2.5 mg (10/25/2018), 2.5 mg (11/01/2018), 2.5 mg (11/08/2018), 2.5 mg (11/15/2018), 2.5 mg (11/22/2018) cyclophosphamide (CYTOXAN) 300 mg in sodium chloride 0.9 % 250 mL chemo infusion, 300 mg (100 % of original dose 300 mg), Intravenous,  Once, 23 of 26 cycles Dose modification: 300 mg (original dose 300 mg, Cycle 1, Reason: Change in SCr/CrCl), 500 mg (original dose 500 mg, Cycle 2, Reason: Provider Judgment), 500 mg (original dose 500  mg, Cycle 3, Reason: Change in SCr/CrCl) Administration: 300 mg (06/17/2018), 500 mg (06/23/2018), 500 mg (06/30/2018), 600 mg (07/07/2018), 600 mg (07/14/2018), 600 mg (07/22/2018), 600 mg (07/29/2018), 600 mg (08/06/2018), 600 mg (08/13/2018), 600 mg (08/20/2018), 600 mg (08/27/2018), 600 mg (09/06/2018), 600 mg (09/13/2018), 600 mg (09/20/2018), 600 mg (09/27/2018), 600 mg (10/04/2018), 600 mg (10/11/2018), 600 mg (10/18/2018), 600 mg (10/25/2018), 600 mg (11/01/2018), 600 mg (11/08/2018), 600 mg (11/15/2018), 600 mg (11/22/2018)  for chemotherapy treatment.       INTERVAL HISTORY:  Mr. Mergenthaler 58 y.o. male seen for multiple myeloma follow-up.  Appetite and energy levels are 50%.  Occasional numbness in the bottom of the feet is stable.  Denies any further numbness in the left side of the face.  Denies any fevers, night sweats or weight loss.  Denies any infections or ER visits.  Pain is well controlled on the current regimen.  He is taking Valtrex twice daily.  He is not taking Veltassa on regular basis.     REVIEW OF SYSTEMS:  Review of Systems  Musculoskeletal: Positive for back pain.  Neurological: Positive for numbness.  All other systems reviewed and are negative.    PAST MEDICAL/SURGICAL HISTORY:  Past Medical History:  Diagnosis Date  . Allergy   . Arthritis    neck and back  . Blood transfusion without reported diagnosis   . BPH (benign prostatic hyperplasia)   . Cancer [facility]) 2004   testicle  . Chronic kidney disease    kidney stones  . Left lumbar radiculopathy 06/12/2016  . 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 Medical history non-contributory    Pt has scattered thoughts and uncertain of past medical history  .  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 Stones, urinary tract   . Substance abuse ([place])    prescribed oxydcodone- 10 years   Past Surgical History:  Procedure Laterality Date  . BACK SURGERY     x5  . CERVICAL DISC SURGERY     x2  . COLONOSCOPY WITH PROPOFOL N/A 08/11/2016   Dr.  Gala Romney: non-bleeding internal hemorrhoids, one 4 mm hyperplastic rectal polyp, diverticulosis in entire examined colon  . POLYPECTOMY  08/11/2016   Procedure: POLYPECTOMY;  Surgeon: [name], MD;  Location: [facility] ENDO SUITE;  Service: Endoscopy;;  colon  . PORTACATH PLACEMENT Left 09/20/2018   Procedure: INSERTION PORT-A-CATH (catheter attached left subclavian);  Surgeon: Virl Cagey, MD;  Location: [facility] ORS;  Service: General;  Laterality: Left;  . testicular cancer  2004  . TONSILLECTOMY       SOCIAL HISTORY:  Social History   Socioeconomic History  . Marital status: Single    Spouse name: Not on file  . Number of children: 1  . Years of education: 52  . Highest education level: Not on file  Occupational History  . Occupation: retired    Comment: Psychologist, prison and probation services  . Occupation: disabled  Social Needs  . Financial resource strain: Not on file  . Food insecurity    Worry: Not on file    Inability: Not on file  . Transportation needs    Medical: Not on file    Non-medical: Not on file  Tobacco Use  . Smoking status: Current Every Day Smoker    Packs/day: 1.00    Years: 40.00    Pack years: 40.00    Types: Cigarettes    Start date: 03/31/1974  . Smokeless tobacco: Never Used  Substance and Sexual Activity  . Alcohol use: Yes    Comment: Occasional  . Drug use: No    Types: Cocaine    Comment: Remote hx of cocaine, quit 1989  . Sexual activity: Yes  Lifestyle  . Physical activity    Days per week: Not on file    Minutes per session: Not on file  . Stress: Not on file  Relationships  . Social Herbalist on phone: Not on file    Gets together: Not on file    Attends religious service: Not on file    Active member of club or organization: Not on file    Attends meetings of clubs or organizations: Not on file    Relationship status: Not on file  . Intimate partner violence    Fear of current or ex partner: No    Emotionally abused: No    Physically abused:  No    Forced sexual activity: No  Other Topics Concern  . Not on file  Social History Narrative   [employer] for 12 years   [employer] for 68 years      Never married   One daughter      Lives alone   Retail banker   Right-handed   Occasional caffeine use          FAMILY HISTORY:  Family History  Problem Relation Age of Onset  . COPD Mother   . Heart disease Mother   . Other Father        Never knew his father.  . Thyroid disease Sister   . Arthritis Sister   . Heart disease Sister        bypass  . Colon cancer  Neg Hx     CURRENT MEDICATIONS:  Outpatient Encounter Medications as of 11/22/2018  Medication Sig  . bortezomib IV (VELCADE) 3.5 MG injection Inject 1.3 mg/m2 into the vein once a week.   . calcitRIOL (ROCALTROL) 0.5 MCG capsule Take 0.5 mcg by mouth daily.   . CYCLOPHOSPHAMIDE IV Inject into the vein once a week.  . diphenhydrAMINE (BENADRYL) 25 MG tablet Take 1 tablet (25 mg total) by mouth as needed for itching.  Marland Kitchen Epoetin Alfa (PROCRIT IJ) Inject as directed. Unsure of dosage- gets this once weekly  . HYDROmorphone (DILAUDID) 4 MG tablet Take 1 tablet (4 mg total) by mouth every 8 (eight) hours as needed for severe pain.  . Misc. Devices MISC Please provide patient with rollaider.  Dx: multiple myeloma C90.0  . Patiromer Sorbitex Calcium (VELTASSA PO) Take by mouth 2 (two) times a week.   . senna (SENOKOT) 8.6 MG tablet Take 1 tablet by mouth 2 (two) times daily.   . sodium bicarbonate 650 MG tablet Take 1,300 mg by mouth 2 (two) times daily.  Marland Kitchen torsemide (DEMADEX) 20 MG tablet TAKING 1 TABLET DAILY, AND 2 TABLETS ONLY IF HIS ANKLES ARE REALLY SWOLLEN  . valACYclovir (VALTREX) 500 MG tablet Take 1 tablet (500 mg total) by mouth 2 (two) times daily.   No facility-administered encounter medications on file as of 11/22/2018.     ALLERGIES:  Allergies  Allergen Reactions  . Oxycodone-Acetaminophen   . Acetaminophen Nausea Only and Other  (See Comments)    Elevated liver enzymes  . Cymbalta [Duloxetine Hcl] Swelling    Facial swelling  . Diclofenac Sodium Swelling       . Imodium [Loperamide] Swelling    facial  . Lyrica [Pregabalin] Swelling  . Morphine Other (See Comments)    Bradycardia   . Vioxx [Rofecoxib] Swelling  . Aleve [Naproxen] Swelling    eye     PHYSICAL EXAM:  ECOG Performance status: 1  Vitals:   11/22/18 0839  BP: 117/74  Pulse: 80  Resp: 16  Temp: (!) 97.1 F (36.2 C)  SpO2: 99%   Filed Weights   11/22/18 0839  Weight: 169 lb (76.7 kg)    Physical Exam Vitals signs reviewed.  Constitutional:      Appearance: Normal appearance. He is normal weight.  Cardiovascular:     Rate and Rhythm: Normal rate and regular rhythm.     Heart sounds: Normal heart sounds.  Pulmonary:     Effort: Pulmonary effort is normal.     Breath sounds: Normal breath sounds.  Abdominal:     General: Bowel sounds are normal.     Palpations: Abdomen is soft.  Musculoskeletal: Normal range of motion.     Right lower leg: No edema.     Left lower leg: No edema.  Skin:    General: Skin is warm and dry.  Neurological:     Mental Status: He is alert and oriented to person, place, and time. Mental status is at baseline.  Psychiatric:        Mood and Affect: Mood normal.        Behavior: Behavior normal.      LABORATORY DATA:  I have reviewed the labs as listed.  CBC    Component Value Date/Time   WBC 4.9 11/22/2018 0842   RBC 3.11 (L) 11/22/2018 0842   HGB 10.2 (L) 11/22/2018 0842   HCT 31.8 (L) 11/22/2018 0842   PLT 165 11/22/2018 0842   MCV  102.3 (H) 11/22/2018 0842   MCH 32.8 11/22/2018 0842   MCHC 32.1 11/22/2018 0842   RDW 18.4 (H) 11/22/2018 0842   LYMPHSABS 1.0 11/22/2018 0842   MONOABS 0.8 11/22/2018 0842   EOSABS 0.3 11/22/2018 0842   BASOSABS 0.0 11/22/2018 0842   CMP Latest Ref Rng & Units 11/22/2018 11/15/2018 11/08/2018  Glucose 70 - 99 mg/dL 118(H) 88 82  BUN 6 - 20 mg/dL  45(H) 54(H) 49(H)  Creatinine 0.61 - 1.24 mg/dL 4.25(H) 4.18(H) 4.22(H)  Sodium 135 - 145 mmol/L 140 139 138  Potassium 3.5 - 5.1 mmol/L 5.2(H) 4.9 5.4(H)  Chloride 98 - 111 mmol/L 111 109 108  CO2 22 - 32 mmol/L 22 20(L) 21(L)  Calcium 8.9 - 10.3 mg/dL 7.9(L) 7.4(L) 7.7(L)  Total Protein 6.5 - 8.1 g/dL 6.4(L) 6.5 6.5  Total Bilirubin 0.3 - 1.2 mg/dL 0.6 0.5 0.7  Alkaline Phos 38 - 126 U/L 67 65 66  AST 15 - 41 U/L 14(L) 13(L) 13(L)  ALT 0 - 44 U/L 8 9 9       I have personally reviewed his scans and discussed with him.     ASSESSMENT & PLAN:   Kappa light chain myeloma ([place]) 1.  Kappa light chain myeloma: -Presentation with acute renal failure and hypercalcemia.  M spike was 0.4 g.  Immunofixation did not show any immunoglobulin heavy chain.  Kappa free light chains was 12,085.  Free light chain ratio was 1981 and LDH of 224. -Bone marrow biopsy on 06/16/2018 shows 87% atypical plasma cells.  Cytogenetics shows complex chromosome abnormalities.  Gain of 1 q. was seen.  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 - Myeloma labs from 11/01/2018 shows no M spike.  Free light chain ratio improved to 95.5.  Kappa light chains improved to 1347.   - I talked to him about bone marrow transplant.  We have made a referral to Dr. Norma Fredrickson at [facility]. - He complained of left-sided face numbness which was transient.  We did MRI of the brain on 11/18/2018 which showed minimal chronic changes in the white matter typical for age.  He no longer has numbness. - We have reviewed his labs.  He will proceed with next treatment today.  I will see him back in 2 weeks for follow-up.  We will plan to repeat myeloma labs next week.  2.  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 -He will continue Retacrit 20,000 units weekly.  3.  Hyperkalemia: - Potassium today is 5.2.  He has poor adherence to low potassium diet. -He  is not taking Veltassa on a regular basis.  He apparently does not like the taste of it.  I have counseled him to take it every day. - We will give him Kayexalate today.  4.  ID prophylaxis: - He had skin rash with acyclovir.  He will continue Valtrex 500 mg twice daily.  5.  Low back pain: - He will continue Dilaudid 4 mg every 8 hours which is controlling his pain.  6.  Lower extremity edema: -He will continue torsemide 20 mg daily.  Total time spent is 25 minutes with more than 50% of the time spent face-to-face discussing treatment plan, counseling and coordination of care.    Orders placed this encounter:  No orders of the defined types were placed in this encounter.     Derek Jack, MD [place] [phone]

## 2018-11-22 NOTE — Progress Notes (Signed)
Potassium 5.2, Ser Creat 4.25, and BUN 45 today.  Patient seen by Dr. Delton Coombes with verbal order ok to treat today.   Patient tolerated chemotherapy with no complaints voiced.  Port site clean and dry with no bruising or swelling noted at site.  Good blood return noted before and after administration of chemotherapy.  Band aid applied.  Patient left ambulatory with VSS and no s/s of distress noted.

## 2018-11-23 ENCOUNTER — Encounter (HOSPITAL_COMMUNITY): Payer: Self-pay | Admitting: *Deleted

## 2018-11-23 NOTE — Progress Notes (Signed)
  Oncology Nurse Navigator Documentation  Navigator Location: [place] [facility] (11/23/18 1033)   )Navigator Encounter Type: Other (11/23/18 1033)    Patient Visit Type: Follow-up (11/23/18 1033) Treatment Phase: Treatment (11/23/18 1033) Barriers/Navigation Needs: Coordination of Care;Education (11/23/18 1033) Education: Accessing Care/ Finding Providers;Coping with Diagnosis/ Prognosis;Pain/ Symptom Management;Preparing for Upcoming Surgery/ Treatment;Understanding Cancer/ Treatment Options (11/23/18 1033) Interventions: Coordination of Care;Education;Referrals (11/23/18 1033) Referrals: Other(transplant) (11/23/18 1033) Coordination of Care: Appts;Chemo;Other (11/23/18 1033) Education Method: Verbal (11/23/18 1033)      Acuity: Level 3 (11/23/18 1033)     Acuity Level 3: Coordination of multimodality treatment;Other;Ongoing guidance and education provided throughout treatment (11/23/18 1033)   Time Spent with Patient: 60 (11/23/18 1033)   I spoke with Urban Gibson at [facility] and helped coordinate patient's new patient appointment.  I had long conversation with patient regarding the transplant process and explained that once he goes to [facility] they will give him detailed education on the process there.  He voices his concerns about pain and limitations to treatment if it requires him being away from home.  I advised him to write down any questions or concerns he has regarding the transplant and share them with the nurse and physician when he goes for his appointment.  I assured him that we will still be able to assist him, however, they are the experts in transplant education.  He voices understanding.

## 2018-11-29 ENCOUNTER — Inpatient Hospital Stay (HOSPITAL_COMMUNITY): Payer: Medicare Other

## 2018-11-29 ENCOUNTER — Other Ambulatory Visit: Payer: Self-pay

## 2018-11-29 VITALS — BP 120/66 | HR 72 | Temp 97.8°F | Resp 16 | Wt 168.6 lb

## 2018-11-29 DIAGNOSIS — C9000 Multiple myeloma not having achieved remission: Secondary | ICD-10-CM

## 2018-11-29 DIAGNOSIS — N185 Chronic kidney disease, stage 5: Secondary | ICD-10-CM

## 2018-11-29 DIAGNOSIS — Z5112 Encounter for antineoplastic immunotherapy: Secondary | ICD-10-CM | POA: Diagnosis not present

## 2018-11-29 LAB — COMPREHENSIVE METABOLIC PANEL
ALT: 8 U/L (ref 0–44)
AST: 13 U/L — ABNORMAL LOW (ref 15–41)
Albumin: 4 g/dL (ref 3.5–5.0)
Alkaline Phosphatase: 80 U/L (ref 38–126)
Anion gap: 7 (ref 5–15)
BUN: 38 mg/dL — ABNORMAL HIGH (ref 6–20)
CO2: 21 mmol/L — ABNORMAL LOW (ref 22–32)
Calcium: 8 mg/dL — ABNORMAL LOW (ref 8.9–10.3)
Chloride: 110 mmol/L (ref 98–111)
Creatinine, Ser: 4.19 mg/dL — ABNORMAL HIGH (ref 0.61–1.24)
GFR calc Af Amer: 17 mL/min — ABNORMAL LOW (ref >60)
GFR calc non Af Amer: 14 mL/min — ABNORMAL LOW (ref >60)
Glucose, Bld: 87 mg/dL (ref 70–99)
Potassium: 5.2 mmol/L — ABNORMAL HIGH (ref 3.5–5.1)
Sodium: 138 mmol/L (ref 135–145)
Total Bilirubin: 0.5 mg/dL (ref 0.3–1.2)
Total Protein: 6.7 g/dL (ref 6.5–8.1)

## 2018-11-29 LAB — CBC WITH DIFFERENTIAL/PLATELET
Abs Immature Granulocytes: 0.02 10*3/uL (ref 0.00–0.07)
Basophils Absolute: 0 10*3/uL (ref 0.0–0.1)
Basophils Relative: 0 %
Eosinophils Absolute: 0.2 10*3/uL (ref 0.0–0.5)
Eosinophils Relative: 3 %
HCT: 34.4 % — ABNORMAL LOW (ref 39.0–52.0)
Hemoglobin: 10.9 g/dL — ABNORMAL LOW (ref 13.0–17.0)
Immature Granulocytes: 0 %
Lymphocytes Relative: 21 %
Lymphs Abs: 1 10*3/uL (ref 0.7–4.0)
MCH: 32.9 pg (ref 26.0–34.0)
MCHC: 31.7 g/dL (ref 30.0–36.0)
MCV: 103.9 fL — ABNORMAL HIGH (ref 80.0–100.0)
Monocytes Absolute: 0.7 10*3/uL (ref 0.1–1.0)
Monocytes Relative: 13 %
Neutro Abs: 3.1 10*3/uL (ref 1.7–7.7)
Neutrophils Relative %: 63 %
Platelets: 217 10*3/uL (ref 150–400)
RBC: 3.31 MIL/uL — ABNORMAL LOW (ref 4.22–5.81)
RDW: 18.4 % — ABNORMAL HIGH (ref 11.5–15.5)
WBC: 5 10*3/uL (ref 4.0–10.5)
nRBC: 0 % (ref 0.0–0.2)

## 2018-11-29 LAB — MAGNESIUM: Magnesium: 2 mg/dL (ref 1.7–2.4)

## 2018-11-29 MED ORDER — HEPARIN SOD (PORK) LOCK FLUSH 100 UNIT/ML IV SOLN
500.0000 [IU] | Freq: Once | INTRAVENOUS | Status: AC | PRN
  Administered 2018-11-29: 500 [IU]

## 2018-11-29 MED ORDER — SODIUM CHLORIDE 0.9 % IV SOLN
300.0000 mg/m2 | Freq: Once | INTRAVENOUS | Status: AC
  Administered 2018-11-29: 600 mg via INTRAVENOUS
  Filled 2018-11-29: qty 30

## 2018-11-29 MED ORDER — SODIUM CHLORIDE 0.9 % IV SOLN
20.0000 mg | Freq: Once | INTRAVENOUS | Status: AC
  Administered 2018-11-29: 20 mg via INTRAVENOUS
  Filled 2018-11-29: qty 2

## 2018-11-29 MED ORDER — SODIUM CHLORIDE 0.9 % IV SOLN
Freq: Once | INTRAVENOUS | Status: AC
  Administered 2018-11-29: 12:00:00 via INTRAVENOUS

## 2018-11-29 MED ORDER — SODIUM CHLORIDE 0.9% FLUSH
10.0000 mL | INTRAVENOUS | Status: DC | PRN
  Administered 2018-11-29: 10 mL
  Filled 2018-11-29: qty 10

## 2018-11-29 MED ORDER — BORTEZOMIB CHEMO SQ INJECTION 3.5 MG (2.5MG/ML)
1.3000 mg/m2 | Freq: Once | INTRAMUSCULAR | Status: AC
  Administered 2018-11-29: 2.5 mg via SUBCUTANEOUS
  Filled 2018-11-29: qty 1

## 2018-11-29 MED ORDER — PALONOSETRON HCL INJECTION 0.25 MG/5ML
0.2500 mg | Freq: Once | INTRAVENOUS | Status: AC
  Administered 2018-11-29: 0.25 mg via INTRAVENOUS
  Filled 2018-11-29: qty 5

## 2018-11-29 NOTE — Progress Notes (Signed)
Labs reviewed today with MD. K+ 5.2, kidney function elevated and noted by MD. No new orders. Proceed with treatment.   Treatment given per orders. Patient tolerated it well without problems. Vitals stable and discharged home from clinic ambulatory. Follow up as scheduled.

## 2018-11-29 NOTE — Patient Instructions (Signed)
[facility] Discharge Instructions for Patients Receiving Chemotherapy  Today you received the following chemotherapy agents   To help prevent nausea and vomiting after your treatment, we encourage you to take your nausea medication   If you develop nausea and vomiting that is not controlled by your nausea medication, call the clinic.   BELOW ARE SYMPTOMS THAT SHOULD BE REPORTED IMMEDIATELY:  *FEVER GREATER THAN 100.5 F  *CHILLS WITH OR WITHOUT FEVER  NAUSEA AND VOMITING THAT IS NOT CONTROLLED WITH YOUR NAUSEA MEDICATION  *UNUSUAL SHORTNESS OF BREATH  *UNUSUAL BRUISING OR BLEEDING  TENDERNESS IN MOUTH AND THROAT WITH OR WITHOUT PRESENCE OF ULCERS  *URINARY PROBLEMS  *BOWEL PROBLEMS  UNUSUAL RASH Items with * indicate a potential emergency and should be followed up as soon as possible.  Feel free to call the clinic should you have any questions or concerns. The clinic phone number is [phone].  Please show the CHEMO ALERT CARD at check-in to the Emergency Department and triage nurse.   

## 2018-11-30 ENCOUNTER — Other Ambulatory Visit (HOSPITAL_COMMUNITY): Payer: Self-pay | Admitting: *Deleted

## 2018-11-30 MED ORDER — HYDROMORPHONE HCL 4 MG PO TABS: 4.0000 mg | ORAL_TABLET | Freq: Three times a day (TID) | ORAL | 0 refills | Status: DC | PRN

## 2018-12-07 ENCOUNTER — Encounter (HOSPITAL_COMMUNITY): Payer: Self-pay | Admitting: *Deleted

## 2018-12-07 ENCOUNTER — Other Ambulatory Visit (HOSPITAL_COMMUNITY): Payer: Medicare Other

## 2018-12-07 ENCOUNTER — Ambulatory Visit (HOSPITAL_COMMUNITY): Payer: Medicare Other | Admitting: Hematology

## 2018-12-07 ENCOUNTER — Ambulatory Visit (HOSPITAL_COMMUNITY): Payer: Medicare Other

## 2018-12-07 NOTE — Progress Notes (Signed)
Cytogenetics and original diagnosing bone marrow biopsy faxed to Urban Gibson, transplant coordinator at [facility].

## 2018-12-08 ENCOUNTER — Other Ambulatory Visit (HOSPITAL_COMMUNITY): Payer: Self-pay | Admitting: *Deleted

## 2018-12-08 ENCOUNTER — Other Ambulatory Visit: Payer: Self-pay

## 2018-12-08 ENCOUNTER — Encounter (HOSPITAL_COMMUNITY): Payer: Self-pay | Admitting: Hematology

## 2018-12-08 ENCOUNTER — Inpatient Hospital Stay (HOSPITAL_BASED_OUTPATIENT_CLINIC_OR_DEPARTMENT_OTHER): Payer: Medicare Other | Admitting: Hematology

## 2018-12-08 ENCOUNTER — Inpatient Hospital Stay (HOSPITAL_COMMUNITY): Payer: Medicare Other | Attending: Hematology

## 2018-12-08 ENCOUNTER — Inpatient Hospital Stay (HOSPITAL_COMMUNITY): Payer: Medicare Other

## 2018-12-08 VITALS — BP 131/79 | HR 63 | Temp 97.3°F | Resp 18

## 2018-12-08 DIAGNOSIS — C9000 Multiple myeloma not having achieved remission: Secondary | ICD-10-CM

## 2018-12-08 DIAGNOSIS — M545 Low back pain: Secondary | ICD-10-CM | POA: Diagnosis not present

## 2018-12-08 DIAGNOSIS — E875 Hyperkalemia: Secondary | ICD-10-CM | POA: Diagnosis not present

## 2018-12-08 DIAGNOSIS — N185 Chronic kidney disease, stage 5: Secondary | ICD-10-CM

## 2018-12-08 DIAGNOSIS — Z5111 Encounter for antineoplastic chemotherapy: Secondary | ICD-10-CM | POA: Diagnosis present

## 2018-12-08 DIAGNOSIS — N189 Chronic kidney disease, unspecified: Secondary | ICD-10-CM | POA: Insufficient documentation

## 2018-12-08 DIAGNOSIS — D631 Anemia in chronic kidney disease: Secondary | ICD-10-CM | POA: Insufficient documentation

## 2018-12-08 DIAGNOSIS — Z5112 Encounter for antineoplastic immunotherapy: Secondary | ICD-10-CM | POA: Diagnosis present

## 2018-12-08 DIAGNOSIS — R600 Localized edema: Secondary | ICD-10-CM | POA: Insufficient documentation

## 2018-12-08 LAB — CBC WITH DIFFERENTIAL/PLATELET
Abs Immature Granulocytes: 0.01 10*3/uL (ref 0.00–0.07)
Basophils Absolute: 0 10*3/uL (ref 0.0–0.1)
Basophils Relative: 1 %
Eosinophils Absolute: 0.2 10*3/uL (ref 0.0–0.5)
Eosinophils Relative: 4 %
HCT: 32.5 % — ABNORMAL LOW (ref 39.0–52.0)
Hemoglobin: 10.4 g/dL — ABNORMAL LOW (ref 13.0–17.0)
Immature Granulocytes: 0 %
Lymphocytes Relative: 18 %
Lymphs Abs: 0.7 10*3/uL (ref 0.7–4.0)
MCH: 33.3 pg (ref 26.0–34.0)
MCHC: 32 g/dL (ref 30.0–36.0)
MCV: 104.2 fL — ABNORMAL HIGH (ref 80.0–100.0)
Monocytes Absolute: 0.7 10*3/uL (ref 0.1–1.0)
Monocytes Relative: 16 %
Neutro Abs: 2.5 10*3/uL (ref 1.7–7.7)
Neutrophils Relative %: 61 %
Platelets: 197 10*3/uL (ref 150–400)
RBC: 3.12 MIL/uL — ABNORMAL LOW (ref 4.22–5.81)
RDW: 17.7 % — ABNORMAL HIGH (ref 11.5–15.5)
WBC: 4.1 10*3/uL (ref 4.0–10.5)
nRBC: 0 % (ref 0.0–0.2)

## 2018-12-08 LAB — COMPREHENSIVE METABOLIC PANEL
ALT: 10 U/L (ref 0–44)
AST: 14 U/L — ABNORMAL LOW (ref 15–41)
Albumin: 4 g/dL (ref 3.5–5.0)
Alkaline Phosphatase: 78 U/L (ref 38–126)
Anion gap: 7 (ref 5–15)
BUN: 42 mg/dL — ABNORMAL HIGH (ref 6–20)
CO2: 21 mmol/L — ABNORMAL LOW (ref 22–32)
Calcium: 7.5 mg/dL — ABNORMAL LOW (ref 8.9–10.3)
Chloride: 109 mmol/L (ref 98–111)
Creatinine, Ser: 4.05 mg/dL — ABNORMAL HIGH (ref 0.61–1.24)
GFR calc Af Amer: 17 mL/min — ABNORMAL LOW (ref >60)
GFR calc non Af Amer: 15 mL/min — ABNORMAL LOW (ref >60)
Glucose, Bld: 92 mg/dL (ref 70–99)
Potassium: 5.1 mmol/L (ref 3.5–5.1)
Sodium: 137 mmol/L (ref 135–145)
Total Bilirubin: 0.6 mg/dL (ref 0.3–1.2)
Total Protein: 6.4 g/dL — ABNORMAL LOW (ref 6.5–8.1)

## 2018-12-08 LAB — MAGNESIUM: Magnesium: 2 mg/dL (ref 1.7–2.4)

## 2018-12-08 LAB — LACTATE DEHYDROGENASE: LDH: 196 U/L — ABNORMAL HIGH (ref 98–192)

## 2018-12-08 MED ORDER — HEPARIN SOD (PORK) LOCK FLUSH 100 UNIT/ML IV SOLN
500.0000 [IU] | Freq: Once | INTRAVENOUS | Status: AC | PRN
  Administered 2018-12-08: 500 [IU]

## 2018-12-08 MED ORDER — SODIUM CHLORIDE 0.9% FLUSH
10.0000 mL | INTRAVENOUS | Status: DC | PRN
  Administered 2018-12-08: 10 mL
  Filled 2018-12-08: qty 10

## 2018-12-08 MED ORDER — PALONOSETRON HCL INJECTION 0.25 MG/5ML
0.2500 mg | Freq: Once | INTRAVENOUS | Status: AC
  Administered 2018-12-08: 0.25 mg via INTRAVENOUS
  Filled 2018-12-08: qty 5

## 2018-12-08 MED ORDER — EPOETIN ALFA-EPBX 10000 UNIT/ML IJ SOLN
20000.0000 [IU] | Freq: Once | INTRAMUSCULAR | Status: AC
  Administered 2018-12-08: 20000 [IU] via SUBCUTANEOUS
  Filled 2018-12-08: qty 2

## 2018-12-08 MED ORDER — SODIUM CHLORIDE 0.9 % IV SOLN
Freq: Once | INTRAVENOUS | Status: AC
  Administered 2018-12-08: 11:00:00 via INTRAVENOUS

## 2018-12-08 MED ORDER — SODIUM CHLORIDE 0.9 % IV SOLN
300.0000 mg/m2 | Freq: Once | INTRAVENOUS | Status: AC
  Administered 2018-12-08: 600 mg via INTRAVENOUS
  Filled 2018-12-08: qty 30

## 2018-12-08 MED ORDER — BORTEZOMIB CHEMO SQ INJECTION 3.5 MG (2.5MG/ML)
1.3000 mg/m2 | Freq: Once | INTRAMUSCULAR | Status: AC
  Administered 2018-12-08: 2.5 mg via SUBCUTANEOUS
  Filled 2018-12-08: qty 1

## 2018-12-08 MED ORDER — SODIUM CHLORIDE 0.9 % IV SOLN
20.0000 mg | Freq: Once | INTRAVENOUS | Status: AC
  Administered 2018-12-08: 20 mg via INTRAVENOUS
  Filled 2018-12-08: qty 20

## 2018-12-08 NOTE — Patient Instructions (Signed)
[facility] Discharge Instructions for Patients Receiving Chemotherapy  Today you received the following chemotherapy agents   To help prevent nausea and vomiting after your treatment, we encourage you to take your nausea medication   If you develop nausea and vomiting that is not controlled by your nausea medication, call the clinic.   BELOW ARE SYMPTOMS THAT SHOULD BE REPORTED IMMEDIATELY:  *FEVER GREATER THAN 100.5 F  *CHILLS WITH OR WITHOUT FEVER  NAUSEA AND VOMITING THAT IS NOT CONTROLLED WITH YOUR NAUSEA MEDICATION  *UNUSUAL SHORTNESS OF BREATH  *UNUSUAL BRUISING OR BLEEDING  TENDERNESS IN MOUTH AND THROAT WITH OR WITHOUT PRESENCE OF ULCERS  *URINARY PROBLEMS  *BOWEL PROBLEMS  UNUSUAL RASH Items with * indicate a potential emergency and should be followed up as soon as possible.  Feel free to call the clinic should you have any questions or concerns. The clinic phone number is [phone].  Please show the CHEMO ALERT CARD at check-in to the Emergency Department and triage nurse.   

## 2018-12-08 NOTE — Progress Notes (Signed)
Pt presents today for Velcade and Cytoxan with f/u appointment with Dr. Delton Coombes. VS within parameters for treatment. Pt has no complaints of any changes since the last visit. MAR reviewed. Message received from [facility] LPN pt is okay to proceed with treatment.  Labs reviewed by MD for tx. MD aware creat 4.05 and Calcium 7.5. No new orders received.   Treatment given today per MD orders. Tolerated infusion without adverse affects. Vital signs stable. No complaints at this time. Discharged from clinic ambulatory. F/U with [facility] as scheduled.

## 2018-12-08 NOTE — Progress Notes (Signed)
[place], [place] [address]   CLINIC:  Medical Oncology/Hematology  PCP:  Rosita Fire, MD [place] [address] [phone]   REASON FOR VISIT: Follow-up for Multiple myeloma    BRIEF ONCOLOGIC HISTORY:  Oncology History  Kappa light chain myeloma ([place])  06/16/2018 Initial Diagnosis   Kappa light chain myeloma ([facility])   06/17/2018 -  Chemotherapy   The patient had palonosetron (ALOXI) injection 0.25 mg, 0.25 mg, Intravenous,  Once, 25 of 28 cycles Administration: 0.25 mg (06/17/2018), 0.25 mg (06/23/2018), 0.25 mg (06/30/2018), 0.25 mg (07/07/2018), 0.25 mg (07/14/2018), 0.25 mg (07/22/2018), 0.25 mg (07/29/2018), 0.25 mg (08/06/2018), 0.25 mg (08/13/2018), 0.25 mg (08/20/2018), 0.25 mg (08/27/2018), 0.25 mg (09/06/2018), 0.25 mg (09/13/2018), 0.25 mg (09/20/2018), 0.25 mg (09/27/2018), 0.25 mg (10/04/2018), 0.25 mg (10/11/2018), 0.25 mg (10/18/2018), 0.25 mg (10/25/2018), 0.25 mg (11/01/2018), 0.25 mg (11/08/2018), 0.25 mg (11/15/2018), 0.25 mg (11/22/2018), 0.25 mg (11/29/2018), 0.25 mg (12/08/2018) bortezomib SQ (VELCADE) chemo injection 2.5 mg, 1.3 mg/m2 = 2.5 mg, Subcutaneous,  Once, 25 of 28 cycles Administration: 2.5 mg (06/17/2018), 2.5 mg (06/23/2018), 2.5 mg (06/30/2018), 2.5 mg (07/07/2018), 2.5 mg (07/14/2018), 2.5 mg (07/22/2018), 2.5 mg (07/29/2018), 2.5 mg (08/06/2018), 2.5 mg (08/13/2018), 2.5 mg (08/20/2018), 2.5 mg (08/27/2018), 2.5 mg (09/06/2018), 2.5 mg (09/13/2018), 2.5 mg (09/20/2018), 2.5 mg (09/27/2018), 2.5 mg (10/04/2018), 2.5 mg (10/11/2018), 2.5 mg (10/18/2018), 2.5 mg (10/25/2018), 2.5 mg (11/01/2018), 2.5 mg (11/08/2018), 2.5 mg (11/15/2018), 2.5 mg (11/22/2018), 2.5 mg (11/29/2018), 2.5 mg (12/08/2018) cyclophosphamide (CYTOXAN) 300 mg in sodium chloride 0.9 % 250 mL chemo infusion, 300 mg (100 % of original dose 300 mg), Intravenous,  Once, 25 of 28 cycles Dose modification: 300 mg (original dose 300 mg, Cycle 1, Reason: Change in SCr/CrCl), 500 mg  (original dose 500 mg, Cycle 2, Reason: Provider Judgment), 500 mg (original dose 500 mg, Cycle 3, Reason: Change in SCr/CrCl) Administration: 300 mg (06/17/2018), 500 mg (06/23/2018), 500 mg (06/30/2018), 600 mg (07/07/2018), 600 mg (07/14/2018), 600 mg (07/22/2018), 600 mg (07/29/2018), 600 mg (08/06/2018), 600 mg (08/13/2018), 600 mg (08/20/2018), 600 mg (08/27/2018), 600 mg (09/06/2018), 600 mg (09/13/2018), 600 mg (09/20/2018), 600 mg (09/27/2018), 600 mg (10/04/2018), 600 mg (10/11/2018), 600 mg (10/18/2018), 600 mg (10/25/2018), 600 mg (11/01/2018), 600 mg (11/08/2018), 600 mg (11/15/2018), 600 mg (11/22/2018), 600 mg (11/29/2018), 600 mg (12/08/2018)  for chemotherapy treatment.       INTERVAL HISTORY:  [name] 58 y.o. male seen for follow-up of multiple myeloma.  Appetite and energy levels are 50%.  Pain in the lower back is 7 out of 10 and stable.  Numbness in the feet and hands is also stable.  Denies any fevers or night sweats.  He was evaluated at [facility] for bone marrow transplant yesterday.  He is still not sure whether he wants to proceed with the transplant.  He is not taking Veltassa on a regular basis.     REVIEW OF SYSTEMS:  Review of Systems  Musculoskeletal: Positive for back pain.  Neurological: Positive for numbness.  All other systems reviewed and are negative.    PAST MEDICAL/SURGICAL HISTORY:  Past Medical History:  Diagnosis Date  . Allergy   . Arthritis    neck and back  . Blood transfusion without reported diagnosis   . BPH (benign prostatic hyperplasia)   . Cancer [facility]) 2004   testicle  . Chronic kidney disease    kidney stones  . Left lumbar radiculopathy 06/12/2016  .  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 Medical history non-contributory    Pt has scattered thoughts and uncertain of past medical history  . 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 Stones, urinary tract   . Substance abuse ([place])    prescribed oxydcodone- 10 years   Past Surgical History:   Procedure Laterality Date  . BACK SURGERY     x5  . CERVICAL DISC SURGERY     x2  . COLONOSCOPY WITH PROPOFOL N/A 08/11/2016   Dr. Gala Romney: non-bleeding internal hemorrhoids, one 4 mm hyperplastic rectal polyp, diverticulosis in entire examined colon  . POLYPECTOMY  08/11/2016   Procedure: POLYPECTOMY;  Surgeon: [name], MD;  Location: [facility] ENDO SUITE;  Service: Endoscopy;;  colon  . PORTACATH PLACEMENT Left 09/20/2018   Procedure: INSERTION PORT-A-CATH (catheter attached left subclavian);  Surgeon: Virl Cagey, MD;  Location: [facility] ORS;  Service: General;  Laterality: Left;  . testicular cancer  2004  . TONSILLECTOMY       SOCIAL HISTORY:  Social History   Socioeconomic History  . Marital status: Single    Spouse name: Not on file  . Number of children: 1  . Years of education: 28  . Highest education level: Not on file  Occupational History  . Occupation: retired    Comment: Psychologist, prison and probation services  . Occupation: disabled  Social Needs  . Financial resource strain: Not on file  . Food insecurity    Worry: Not on file    Inability: Not on file  . Transportation needs    Medical: Not on file    Non-medical: Not on file  Tobacco Use  . Smoking status: Current Every Day Smoker    Packs/day: 1.00    Years: 40.00    Pack years: 40.00    Types: Cigarettes    Start date: 03/31/1974  . Smokeless tobacco: Never Used  Substance and Sexual Activity  . Alcohol use: Yes    Comment: Occasional  . Drug use: No    Types: Cocaine    Comment: Remote hx of cocaine, quit 1989  . Sexual activity: Yes  Lifestyle  . Physical activity    Days per week: Not on file    Minutes per session: Not on file  . Stress: Not on file  Relationships  . Social Herbalist on phone: Not on file    Gets together: Not on file    Attends religious service: Not on file    Active member of club or organization: Not on file    Attends meetings of clubs or organizations: Not on file    Relationship  status: Not on file  . Intimate partner violence    Fear of current or ex partner: No    Emotionally abused: No    Physically abused: No    Forced sexual activity: No  Other Topics Concern  . Not on file  Social History Narrative   [employer] for 12 years   [employer] for 73 years      Never married   One daughter      Lives alone   Retail banker   Right-handed   Occasional caffeine use          FAMILY HISTORY:  Family History  Problem Relation Age of Onset  . COPD Mother   . Heart disease Mother   . Other Father        Never knew his father.  . Thyroid disease Sister   .  Arthritis Sister   . Heart disease Sister        bypass  . Colon cancer Neg Hx     CURRENT MEDICATIONS:  Outpatient Encounter Medications as of 12/08/2018  Medication Sig  . bortezomib IV (VELCADE) 3.5 MG injection Inject 1.3 mg/m2 into the vein once a week.   . calcitRIOL (ROCALTROL) 0.5 MCG capsule Take 0.5 mcg by mouth daily.   . CYCLOPHOSPHAMIDE IV Inject into the vein once a week.  Marland Kitchen Epoetin Alfa (PROCRIT IJ) Inject as directed. Unsure of dosage- gets this once weekly  . HYDROmorphone (DILAUDID) 4 MG tablet Take 1 tablet (4 mg total) by mouth every 8 (eight) hours as needed for severe pain.  . Misc. Devices MISC Please provide patient with rollaider.  Dx: multiple myeloma C90.0  . Patiromer Sorbitex Calcium (VELTASSA PO) Take by mouth 2 (two) times a week.   . senna (SENOKOT) 8.6 MG tablet Take 1 tablet by mouth 2 (two) times daily.   . sodium bicarbonate 650 MG tablet Take 1,300 mg by mouth 2 (two) times daily.  Marland Kitchen torsemide (DEMADEX) 20 MG tablet TAKING 1 TABLET DAILY, AND 2 TABLETS ONLY IF HIS ANKLES ARE REALLY SWOLLEN  . valACYclovir (VALTREX) 500 MG tablet Take 1 tablet (500 mg total) by mouth 2 (two) times daily.  . diphenhydrAMINE (BENADRYL) 25 MG tablet Take 1 tablet (25 mg total) by mouth as needed for itching. (Patient not taking: Reported on 12/08/2018)   No  facility-administered encounter medications on file as of 12/08/2018.     ALLERGIES:  Allergies  Allergen Reactions  . Oxycodone-Acetaminophen   . Acetaminophen Nausea Only and Other (See Comments)    Elevated liver enzymes  . Cymbalta [Duloxetine Hcl] Swelling    Facial swelling  . Diclofenac Sodium Swelling       . Diclofenac Sodium Swelling  . Imodium [Loperamide] Swelling    facial  . Lyrica [Pregabalin] Swelling  . Morphine Other (See Comments)    Bradycardia   . Vioxx [Rofecoxib] Swelling  . Aleve [Naproxen] Swelling    eye     PHYSICAL EXAM:  ECOG Performance status: 1  Vitals:   12/08/18 0912  BP: 121/74  Pulse: 83  Resp: 18  Temp: 97.8 F (36.6 C)  SpO2: 100%   Filed Weights   12/08/18 0912  Weight: 172 lb 6.4 oz (78.2 kg)    Physical Exam Vitals signs reviewed.  Constitutional:      Appearance: Normal appearance. He is normal weight.  Cardiovascular:     Rate and Rhythm: Normal rate and regular rhythm.     Heart sounds: Normal heart sounds.  Pulmonary:     Effort: Pulmonary effort is normal.     Breath sounds: Normal breath sounds.  Abdominal:     General: Bowel sounds are normal.     Palpations: Abdomen is soft.  Musculoskeletal: Normal range of motion.     Right lower leg: No edema.     Left lower leg: No edema.  Skin:    General: Skin is warm and dry.  Neurological:     Mental Status: He is alert and oriented to person, place, and time. Mental status is at baseline.  Psychiatric:        Mood and Affect: Mood normal.        Behavior: Behavior normal.      LABORATORY DATA:  I have reviewed the labs as listed.  CBC    Component Value Date/Time   WBC 4.1  12/08/2018 0905   RBC 3.12 (L) 12/08/2018 0905   HGB 10.4 (L) 12/08/2018 0905   HCT 32.5 (L) 12/08/2018 0905   PLT 197 12/08/2018 0905   MCV 104.2 (H) 12/08/2018 0905   MCH 33.3 12/08/2018 0905   MCHC 32.0 12/08/2018 0905   RDW 17.7 (H) 12/08/2018 0905   LYMPHSABS 0.7  12/08/2018 0905   MONOABS 0.7 12/08/2018 0905   EOSABS 0.2 12/08/2018 0905   BASOSABS 0.0 12/08/2018 0905   CMP Latest Ref Rng & Units 12/08/2018 11/29/2018 11/22/2018  Glucose 70 - 99 mg/dL 92 87 118(H)  BUN 6 - 20 mg/dL 42(H) 38(H) 45(H)  Creatinine 0.61 - 1.24 mg/dL 4.05(H) 4.19(H) 4.25(H)  Sodium 135 - 145 mmol/L 137 138 140  Potassium 3.5 - 5.1 mmol/L 5.1 5.2(H) 5.2(H)  Chloride 98 - 111 mmol/L 109 110 111  CO2 22 - 32 mmol/L 21(L) 21(L) 22  Calcium 8.9 - 10.3 mg/dL 7.5(L) 8.0(L) 7.9(L)  Total Protein 6.5 - 8.1 g/dL 6.4(L) 6.7 6.4(L)  Total Bilirubin 0.3 - 1.2 mg/dL 0.6 0.5 0.6  Alkaline Phos 38 - 126 U/L 78 80 67  AST 15 - 41 U/L 14(L) 13(L) 14(L)  ALT 0 - 44 U/L _0 I have personally reviewed his scans and discussed with him.     ASSESSMENT & PLAN:   Kappa light chain myeloma ([place]) 1.  Kappa light chain myeloma: -Presentation with acute renal failure and hypercalcemia.  M spike was 0.4 g.  Immunofixation did not show any immunoglobulin heavy chain.  Kappa free light chains was 12,085.  Free light chain ratio was 1981 and LDH of 224. -Bone marrow biopsy on 06/16/2018 shows 87% atypical plasma cells.  Cytogenetics shows complex chromosome abnormalities.  Gain of 1 q. was seen.  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 - Myeloma labs from 11/01/2018 shows no M spike.  Free light chain ratio improved to 95.5.  Kappa light chains improved to 1347.   - MRI of the brain for left-sided facial numbness was negative. - He was evaluated by Dr. Aris Lot at [facility] for transplant on 12/07/2018. - We have done myeloma panel today which is pending.  He is tolerating bortezomib and cyclophosphamide very well. - His kidney function has been improving.  He will proceed with his next cycle today. -he will come back in 1 week to discuss the results of the myeloma panel.  He is still thinking about whether to proceed with  transplant.  2.  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 -He will continue Retacrit 20,000 units weekly.  3.  Hyperkalemia: - Potassium today is 5.1.  He was counseled to continue Veltassa.  4.  ID prophylaxis: - He had skin rash with acyclovir.  He will continue Valtrex 500 mg twice daily.  5.  Low back pain: - He will continue Dilaudid 4 mg every 8 hours which is controlling his pain.  6.  Lower extremity edema: -He will continue torsemide 20 mg daily.  Total time spent is 25 minutes with more than 50% of the time spent face-to-face discussing treatment plan, counseling and coordination of care.    Orders placed this encounter:  No orders of the defined types were placed in this encounter.     Derek Jack, MD [place] [phone]

## 2018-12-08 NOTE — Assessment & Plan Note (Signed)
1.  Kappa light chain myeloma: -Presentation with acute renal failure and hypercalcemia.  M spike was 0.4 g.  Immunofixation did not show any immunoglobulin heavy chain.  Kappa free light chains was 12,085.  Free light chain ratio was 1981 and LDH of 224. -Bone marrow biopsy on 06/16/2018 shows 87% atypical plasma cells.  Cytogenetics shows complex chromosome abnormalities.  Gain of 1 q. was seen.  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 - Myeloma labs from 11/01/2018 shows no M spike.  Free light chain ratio improved to 95.5.  Kappa light chains improved to 1347.   - MRI of the brain for left-sided facial numbness was negative. - He was evaluated by Dr. Aris Lot at [facility] for transplant on 12/07/2018. - We have done myeloma panel today which is pending.  He is tolerating bortezomib and cyclophosphamide very well. - His kidney function has been improving.  He will proceed with his next cycle today. -he will come back in 1 week to discuss the results of the myeloma panel.  He is still thinking about whether to proceed with transplant.  2.  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 -He will continue Retacrit 20,000 units weekly.  3.  Hyperkalemia: - Potassium today is 5.1.  He was counseled to continue Veltassa.  4.  ID prophylaxis: - He had skin rash with acyclovir.  He will continue Valtrex 500 mg twice daily.  5.  Low back pain: - He will continue Dilaudid 4 mg every 8 hours which is controlling his pain.  6.  Lower extremity edema: -He will continue torsemide 20 mg daily.

## 2018-12-08 NOTE — Patient Instructions (Signed)
[facility] at [facility] Discharge Instructions  You were seen today by Dr. Katragadda. He went over your recent lab results. He will see you back in 1 week for labs and follow up.   Thank you for choosing [facility] at [facility] to provide your oncology and hematology care.  To afford each patient quality time with our provider, please arrive at least 15 minutes before your scheduled appointment time.   If you have a lab appointment with the Cancer Center please come in thru the  Main Entrance and check in at the main information desk  You need to re-schedule your appointment should you arrive 10 or more minutes late.  We strive to give you quality time with our providers, and arriving late affects you and other patients whose appointments are after yours.  Also, if you no show three or more times for appointments you may be dismissed from the clinic at the providers discretion.     Again, thank you for choosing [facility].  Our hope is that these requests will decrease the amount of time that you wait before being seen by our physicians.       _____________________________________________________________  Should you have questions after your visit to [facility], please contact our office at [phone] between the hours of 8:00 a.m. and 4:30 p.m.  Voicemails left after 4:00 p.m. will not be returned until the following business day.  For prescription refill requests, have your pharmacy contact our office and allow 72 hours.    Cancer Center Support Programs:   > Cancer Support Group  2nd Tuesday of the month 1pm-2pm, Journey Room    

## 2018-12-09 LAB — IMMUNOFIXATION ELECTROPHORESIS
IgA: 28 mg/dL — ABNORMAL LOW (ref 90–386)
IgG (Immunoglobin G), Serum: 827 mg/dL (ref 603–1613)
IgM (Immunoglobulin M), Srm: 13 mg/dL — ABNORMAL LOW (ref 20–172)
Total Protein ELP: 6.1 g/dL (ref 6.0–8.5)

## 2018-12-09 LAB — KAPPA/LAMBDA LIGHT CHAINS
Kappa free light chain: 272.8 mg/L — ABNORMAL HIGH (ref 3.3–19.4)
Kappa, lambda light chain ratio: 10.37 — ABNORMAL HIGH (ref 0.26–1.65)
Lambda free light chains: 26.3 mg/L (ref 5.7–26.3)

## 2018-12-09 LAB — PROTEIN ELECTROPHORESIS, SERUM
A/G Ratio: 2.1 — ABNORMAL HIGH (ref 0.7–1.7)
Albumin ELP: 4.1 g/dL (ref 2.9–4.4)
Alpha-1-Globulin: 0.1 g/dL (ref 0.0–0.4)
Alpha-2-Globulin: 0.7 g/dL (ref 0.4–1.0)
Beta Globulin: 0.6 g/dL — ABNORMAL LOW (ref 0.7–1.3)
Gamma Globulin: 0.6 g/dL (ref 0.4–1.8)
Globulin, Total: 2 g/dL — ABNORMAL LOW (ref 2.2–3.9)
Total Protein ELP: 6.1 g/dL (ref 6.0–8.5)

## 2018-12-15 ENCOUNTER — Ambulatory Visit (HOSPITAL_COMMUNITY): Payer: Medicare Other | Admitting: Hematology

## 2018-12-15 ENCOUNTER — Ambulatory Visit (HOSPITAL_COMMUNITY): Payer: Medicare Other

## 2018-12-15 ENCOUNTER — Other Ambulatory Visit (HOSPITAL_COMMUNITY): Payer: Medicare Other

## 2018-12-16 ENCOUNTER — Inpatient Hospital Stay (HOSPITAL_COMMUNITY): Payer: Medicare Other

## 2018-12-16 ENCOUNTER — Encounter (HOSPITAL_COMMUNITY): Payer: Self-pay | Admitting: Hematology

## 2018-12-16 ENCOUNTER — Other Ambulatory Visit: Payer: Self-pay

## 2018-12-16 ENCOUNTER — Inpatient Hospital Stay (HOSPITAL_BASED_OUTPATIENT_CLINIC_OR_DEPARTMENT_OTHER): Payer: Medicare Other | Admitting: Hematology

## 2018-12-16 VITALS — BP 144/83 | HR 72 | Temp 97.7°F | Resp 18

## 2018-12-16 DIAGNOSIS — N185 Chronic kidney disease, stage 5: Secondary | ICD-10-CM

## 2018-12-16 DIAGNOSIS — C9000 Multiple myeloma not having achieved remission: Secondary | ICD-10-CM

## 2018-12-16 DIAGNOSIS — Z5112 Encounter for antineoplastic immunotherapy: Secondary | ICD-10-CM | POA: Diagnosis not present

## 2018-12-16 LAB — COMPREHENSIVE METABOLIC PANEL
ALT: 9 U/L (ref 0–44)
AST: 16 U/L (ref 15–41)
Albumin: 3.8 g/dL (ref 3.5–5.0)
Alkaline Phosphatase: 76 U/L (ref 38–126)
Anion gap: 8 (ref 5–15)
BUN: 40 mg/dL — ABNORMAL HIGH (ref 6–20)
CO2: 20 mmol/L — ABNORMAL LOW (ref 22–32)
Calcium: 7.8 mg/dL — ABNORMAL LOW (ref 8.9–10.3)
Chloride: 111 mmol/L (ref 98–111)
Creatinine, Ser: 4.08 mg/dL — ABNORMAL HIGH (ref 0.61–1.24)
GFR calc Af Amer: 17 mL/min — ABNORMAL LOW (ref >60)
GFR calc non Af Amer: 15 mL/min — ABNORMAL LOW (ref >60)
Glucose, Bld: 90 mg/dL (ref 70–99)
Potassium: 4.8 mmol/L (ref 3.5–5.1)
Sodium: 139 mmol/L (ref 135–145)
Total Bilirubin: 0.5 mg/dL (ref 0.3–1.2)
Total Protein: 6.3 g/dL — ABNORMAL LOW (ref 6.5–8.1)

## 2018-12-16 LAB — CBC WITH DIFFERENTIAL/PLATELET
Abs Immature Granulocytes: 0.01 10*3/uL (ref 0.00–0.07)
Basophils Absolute: 0 10*3/uL (ref 0.0–0.1)
Basophils Relative: 1 %
Eosinophils Absolute: 0.2 10*3/uL (ref 0.0–0.5)
Eosinophils Relative: 5 %
HCT: 32.3 % — ABNORMAL LOW (ref 39.0–52.0)
Hemoglobin: 10.2 g/dL — ABNORMAL LOW (ref 13.0–17.0)
Immature Granulocytes: 0 %
Lymphocytes Relative: 17 %
Lymphs Abs: 0.8 10*3/uL (ref 0.7–4.0)
MCH: 33 pg (ref 26.0–34.0)
MCHC: 31.6 g/dL (ref 30.0–36.0)
MCV: 104.5 fL — ABNORMAL HIGH (ref 80.0–100.0)
Monocytes Absolute: 0.8 10*3/uL (ref 0.1–1.0)
Monocytes Relative: 18 %
Neutro Abs: 2.7 10*3/uL (ref 1.7–7.7)
Neutrophils Relative %: 59 %
Platelets: 166 10*3/uL (ref 150–400)
RBC: 3.09 MIL/uL — ABNORMAL LOW (ref 4.22–5.81)
RDW: 17.8 % — ABNORMAL HIGH (ref 11.5–15.5)
WBC: 4.6 10*3/uL (ref 4.0–10.5)
nRBC: 0 % (ref 0.0–0.2)

## 2018-12-16 LAB — MAGNESIUM: Magnesium: 1.9 mg/dL (ref 1.7–2.4)

## 2018-12-16 MED ORDER — SODIUM CHLORIDE 0.9 % IV SOLN
20.0000 mg | Freq: Once | INTRAVENOUS | Status: AC
  Administered 2018-12-16: 20 mg via INTRAVENOUS
  Filled 2018-12-16: qty 20

## 2018-12-16 MED ORDER — PALONOSETRON HCL INJECTION 0.25 MG/5ML
0.2500 mg | Freq: Once | INTRAVENOUS | Status: AC
  Administered 2018-12-16: 0.25 mg via INTRAVENOUS
  Filled 2018-12-16: qty 5

## 2018-12-16 MED ORDER — SODIUM CHLORIDE 0.9 % IV SOLN
300.0000 mg/m2 | Freq: Once | INTRAVENOUS | Status: AC
  Administered 2018-12-16: 600 mg via INTRAVENOUS
  Filled 2018-12-16: qty 30

## 2018-12-16 MED ORDER — SODIUM CHLORIDE 0.9 % IV SOLN
Freq: Once | INTRAVENOUS | Status: AC
  Administered 2018-12-16: 10:00:00 via INTRAVENOUS

## 2018-12-16 MED ORDER — HEPARIN SOD (PORK) LOCK FLUSH 100 UNIT/ML IV SOLN
500.0000 [IU] | Freq: Once | INTRAVENOUS | Status: AC | PRN
  Administered 2018-12-16: 12:00:00 500 [IU]

## 2018-12-16 MED ORDER — SODIUM CHLORIDE 0.9% FLUSH
10.0000 mL | INTRAVENOUS | Status: DC | PRN
  Administered 2018-12-16: 10 mL
  Filled 2018-12-16: qty 10

## 2018-12-16 MED ORDER — EPOETIN ALFA-EPBX 10000 UNIT/ML IJ SOLN
20000.0000 [IU] | Freq: Once | INTRAMUSCULAR | Status: AC
  Administered 2018-12-16: 20000 [IU] via SUBCUTANEOUS
  Filled 2018-12-16: qty 2

## 2018-12-16 MED ORDER — BORTEZOMIB CHEMO SQ INJECTION 3.5 MG (2.5MG/ML)
1.3000 mg/m2 | Freq: Once | INTRAMUSCULAR | Status: AC
  Administered 2018-12-16: 2.5 mg via SUBCUTANEOUS
  Filled 2018-12-16: qty 1

## 2018-12-16 NOTE — Progress Notes (Signed)
To the treatment room for oncology follow up visit and chemotherapy.  Reviewed side effects of medications for review with all questions asked and answered.  No s/s of distress noted.   Patient seen by Dr. Delton Coombes with lab review and ok to treat today.   Patient tolerated chemotherapy with no complaints voiced.  Port site clean and dry with no bruising or swelling noted at site.  Good blood return noted before and after administration of chemotherapy.  Band aid applied.  Patient left ambulatory with VSS and no s/s of distress noted.

## 2018-12-16 NOTE — Patient Instructions (Signed)
[facility] at [facility] Discharge Instructions  You were seen today by Dr. Katragadda. He went over your recent lab results. He will see you back in 1 week for labs, treatment and follow up.   Thank you for choosing  [facility] Center at [facility] to provide your oncology and hematology care.  To afford each patient quality time with our provider, please arrive at least 15 minutes before your scheduled appointment time.   If you have a lab appointment with the Cancer Center please come in thru the  Main Entrance and check in at the main information desk  You need to re-schedule your appointment should you arrive 10 or more minutes late.  We strive to give you quality time with our providers, and arriving late affects you and other patients whose appointments are after yours.  Also, if you no show three or more times for appointments you may be dismissed from the clinic at the providers discretion.     Again, thank you for choosing [facility].  Our hope is that these requests will decrease the amount of time that you wait before being seen by our physicians.       _____________________________________________________________  Should you have questions after your visit to [facility], please contact our office at [phone] between the hours of 8:00 a.m. and 4:30 p.m.  Voicemails left after 4:00 p.m. will not be returned until the following business day.  For prescription refill requests, have your pharmacy contact our office and allow 72 hours.    Cancer Center Support Programs:   > Cancer Support Group  2nd Tuesday of the month 1pm-2pm, Journey Room    

## 2018-12-16 NOTE — Progress Notes (Signed)
[place], [place] [address]   CLINIC:  Medical Oncology/Hematology  PCP:  Rosita Fire, MD [place] [address] [phone]   REASON FOR VISIT: Follow-up for Multiple myeloma    BRIEF ONCOLOGIC HISTORY:  Oncology History  Kappa light chain myeloma ([place])  06/16/2018 Initial Diagnosis   Kappa light chain myeloma ([place])   06/17/2018 -  Chemotherapy   The patient had palonosetron (ALOXI) injection 0.25 mg, 0.25 mg, Intravenous,  Once, 26 of 28 cycles Administration: 0.25 mg (06/17/2018), 0.25 mg (06/23/2018), 0.25 mg (06/30/2018), 0.25 mg (07/07/2018), 0.25 mg (07/14/2018), 0.25 mg (07/22/2018), 0.25 mg (07/29/2018), 0.25 mg (08/06/2018), 0.25 mg (08/13/2018), 0.25 mg (08/20/2018), 0.25 mg (08/27/2018), 0.25 mg (09/06/2018), 0.25 mg (09/13/2018), 0.25 mg (09/20/2018), 0.25 mg (09/27/2018), 0.25 mg (10/04/2018), 0.25 mg (10/11/2018), 0.25 mg (10/18/2018), 0.25 mg (10/25/2018), 0.25 mg (11/01/2018), 0.25 mg (11/08/2018), 0.25 mg (11/15/2018), 0.25 mg (11/22/2018), 0.25 mg (11/29/2018), 0.25 mg (12/08/2018), 0.25 mg (12/16/2018) bortezomib SQ (VELCADE) chemo injection 2.5 mg, 1.3 mg/m2 = 2.5 mg, Subcutaneous,  Once, 26 of 28 cycles Administration: 2.5 mg (06/17/2018), 2.5 mg (06/23/2018), 2.5 mg (06/30/2018), 2.5 mg (07/07/2018), 2.5 mg (07/14/2018), 2.5 mg (07/22/2018), 2.5 mg (07/29/2018), 2.5 mg (08/06/2018), 2.5 mg (08/13/2018), 2.5 mg (08/20/2018), 2.5 mg (08/27/2018), 2.5 mg (09/06/2018), 2.5 mg (09/13/2018), 2.5 mg (09/20/2018), 2.5 mg (09/27/2018), 2.5 mg (10/04/2018), 2.5 mg (10/11/2018), 2.5 mg (10/18/2018), 2.5 mg (10/25/2018), 2.5 mg (11/01/2018), 2.5 mg (11/08/2018), 2.5 mg (11/15/2018), 2.5 mg (11/22/2018), 2.5 mg (11/29/2018), 2.5 mg (12/08/2018), 2.5 mg (12/16/2018) cyclophosphamide (CYTOXAN) 300 mg in sodium chloride 0.9 % 250 mL chemo infusion, 300 mg (100 % of original dose 300 mg), Intravenous,  Once, 26 of 28 cycles Dose modification: 300 mg (original dose 300 mg, Cycle 1,  Reason: Change in SCr/CrCl), 500 mg (original dose 500 mg, Cycle 2, Reason: Provider Judgment), 500 mg (original dose 500 mg, Cycle 3, Reason: Change in SCr/CrCl) Administration: 300 mg (06/17/2018), 500 mg (06/23/2018), 500 mg (06/30/2018), 600 mg (07/07/2018), 600 mg (07/14/2018), 600 mg (07/22/2018), 600 mg (07/29/2018), 600 mg (08/06/2018), 600 mg (08/13/2018), 600 mg (08/20/2018), 600 mg (08/27/2018), 600 mg (09/06/2018), 600 mg (09/13/2018), 600 mg (09/20/2018), 600 mg (09/27/2018), 600 mg (10/04/2018), 600 mg (10/11/2018), 600 mg (10/18/2018), 600 mg (10/25/2018), 600 mg (11/01/2018), 600 mg (11/08/2018), 600 mg (11/15/2018), 600 mg (11/22/2018), 600 mg (11/29/2018), 600 mg (12/08/2018), 600 mg (12/16/2018)  for chemotherapy treatment.       INTERVAL HISTORY:  Mr. Leoni 58 y.o. male seen for follow-up of multiple myeloma.  He was evaluated for transplant at [facility].  He reports that he has still gets some things in order before he can do the transplant.  He is also trying to get some help to stay with him when he has to stay in the hotel during transplant.  Numbness in the feet has been stable.  Denies any fevers or night sweats.  Appetite and energy levels are 75%.  He reports that he has been taking Veltassa regularly.   REVIEW OF SYSTEMS:  Review of Systems  Neurological: Positive for numbness.  All other systems reviewed and are negative.    PAST MEDICAL/SURGICAL HISTORY:  Past Medical History:  Diagnosis Date  . Allergy   . Arthritis    neck and back  . Blood transfusion without reported diagnosis   . BPH (benign prostatic hyperplasia)   . Cancer [facility]) 2004   testicle  . Chronic kidney disease  kidney stones  . Left lumbar radiculopathy 06/12/2016  . 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 Medical history non-contributory    Pt has scattered thoughts and uncertain of past medical history  . 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 Stones, urinary tract   . Substance abuse ([place])    prescribed  oxydcodone- 10 years   Past Surgical History:  Procedure Laterality Date  . BACK SURGERY     x5  . CERVICAL DISC SURGERY     x2  . COLONOSCOPY WITH PROPOFOL N/A 08/11/2016   Dr. Gala Romney: non-bleeding internal hemorrhoids, one 4 mm hyperplastic rectal polyp, diverticulosis in entire examined colon  . POLYPECTOMY  08/11/2016   Procedure: POLYPECTOMY;  Surgeon: [name], MD;  Location: [facility] ENDO SUITE;  Service: Endoscopy;;  colon  . PORTACATH PLACEMENT Left 09/20/2018   Procedure: INSERTION PORT-A-CATH (catheter attached left subclavian);  Surgeon: Virl Cagey, MD;  Location: [facility] ORS;  Service: General;  Laterality: Left;  . testicular cancer  2004  . TONSILLECTOMY       SOCIAL HISTORY:  Social History   Socioeconomic History  . Marital status: Single    Spouse name: Not on file  . Number of children: 1  . Years of education: 17  . Highest education level: Not on file  Occupational History  . Occupation: retired    Comment: Psychologist, prison and probation services  . Occupation: disabled  Social Needs  . Financial resource strain: Not on file  . Food insecurity    Worry: Not on file    Inability: Not on file  . Transportation needs    Medical: Not on file    Non-medical: Not on file  Tobacco Use  . Smoking status: Current Every Day Smoker    Packs/day: 1.00    Years: 40.00    Pack years: 40.00    Types: Cigarettes    Start date: 03/31/1974  . Smokeless tobacco: Never Used  Substance and Sexual Activity  . Alcohol use: Yes    Comment: Occasional  . Drug use: No    Types: Cocaine    Comment: Remote hx of cocaine, quit 1989  . Sexual activity: Yes  Lifestyle  . Physical activity    Days per week: Not on file    Minutes per session: Not on file  . Stress: Not on file  Relationships  . Social Herbalist on phone: Not on file    Gets together: Not on file    Attends religious service: Not on file    Active member of club or organization: Not on file    Attends meetings of  clubs or organizations: Not on file    Relationship status: Not on file  . Intimate partner violence    Fear of current or ex partner: No    Emotionally abused: No    Physically abused: No    Forced sexual activity: No  Other Topics Concern  . Not on file  Social History Narrative   [employer] for 12 years   [employer] for 36 years      Never married   One daughter      Lives alone   Retail banker   Right-handed   Occasional caffeine use          FAMILY HISTORY:  Family History  Problem Relation Age of Onset  . COPD Mother   . Heart disease Mother   . Other Father  Never knew his father.  . Thyroid disease Sister   . Arthritis Sister   . Heart disease Sister        bypass  . Colon cancer Neg Hx     CURRENT MEDICATIONS:  Outpatient Encounter Medications as of 12/16/2018  Medication Sig  . bortezomib IV (VELCADE) 3.5 MG injection Inject 1.3 mg/m2 into the vein once a week.   . calcitRIOL (ROCALTROL) 0.5 MCG capsule Take 0.5 mcg by mouth daily.   . CYCLOPHOSPHAMIDE IV Inject into the vein once a week.  Marland Kitchen Epoetin Alfa (PROCRIT IJ) Inject as directed. Unsure of dosage- gets this once weekly  . HYDROmorphone (DILAUDID) 4 MG tablet Take 1 tablet (4 mg total) by mouth every 8 (eight) hours as needed for severe pain.  . Patiromer Sorbitex Calcium (VELTASSA PO) Take by mouth 2 (two) times a week.   . senna (SENOKOT) 8.6 MG tablet Take 1 tablet by mouth 2 (two) times daily.   . sodium bicarbonate 650 MG tablet Take 1,300 mg by mouth 2 (two) times daily.  Marland Kitchen torsemide (DEMADEX) 20 MG tablet TAKING 1 TABLET DAILY, AND 2 TABLETS ONLY IF HIS ANKLES ARE REALLY SWOLLEN  . valACYclovir (VALTREX) 500 MG tablet Take 1 tablet (500 mg total) by mouth 2 (two) times daily.  . diphenhydrAMINE (BENADRYL) 25 MG tablet Take 1 tablet (25 mg total) by mouth as needed for itching. (Patient not taking: Reported on 12/08/2018)  . Misc. Devices MISC Please provide patient with  rollaider.  Dx: multiple myeloma C90.0 (Patient not taking: Reported on 12/16/2018)   No facility-administered encounter medications on file as of 12/16/2018.     ALLERGIES:  Allergies  Allergen Reactions  . Oxycodone-Acetaminophen   . Acetaminophen Nausea Only and Other (See Comments)    Elevated liver enzymes  . Cymbalta [Duloxetine Hcl] Swelling    Facial swelling  . Diclofenac Sodium Swelling       . Diclofenac Sodium Swelling  . Imodium [Loperamide] Swelling    facial  . Lyrica [Pregabalin] Swelling  . Morphine Other (See Comments)    Bradycardia   . Vioxx [Rofecoxib] Swelling  . Aleve [Naproxen] Swelling    eye     PHYSICAL EXAM:  ECOG Performance status: 1  Vitals:   12/16/18 0848  BP: (!) 148/83  Pulse: 76  Resp: 18  Temp: (!) 97.1 F (36.2 C)  SpO2: 100%   Filed Weights   12/16/18 0848  Weight: 173 lb 12.8 oz (78.8 kg)    Physical Exam Vitals signs reviewed.  Constitutional:      Appearance: Normal appearance. He is normal weight.  Cardiovascular:     Rate and Rhythm: Normal rate and regular rhythm.     Heart sounds: Normal heart sounds.  Pulmonary:     Effort: Pulmonary effort is normal.     Breath sounds: Normal breath sounds.  Abdominal:     General: Bowel sounds are normal.     Palpations: Abdomen is soft.  Musculoskeletal: Normal range of motion.     Right lower leg: No edema.     Left lower leg: No edema.  Skin:    General: Skin is warm and dry.  Neurological:     Mental Status: He is alert and oriented to person, place, and time. Mental status is at baseline.  Psychiatric:        Mood and Affect: Mood normal.        Behavior: Behavior normal.      LABORATORY DATA:  I have reviewed the labs as listed.  CBC    Component Value Date/Time   WBC 4.6 12/16/2018 0826   RBC 3.09 (L) 12/16/2018 0826   HGB 10.2 (L) 12/16/2018 0826   HCT 32.3 (L) 12/16/2018 0826   PLT 166 12/16/2018 0826   MCV 104.5 (H) 12/16/2018 0826   MCH 33.0  12/16/2018 0826   MCHC 31.6 12/16/2018 0826   RDW 17.8 (H) 12/16/2018 0826   LYMPHSABS 0.8 12/16/2018 0826   MONOABS 0.8 12/16/2018 0826   EOSABS 0.2 12/16/2018 0826   BASOSABS 0.0 12/16/2018 0826   CMP Latest Ref Rng & Units 12/16/2018 12/08/2018 11/29/2018  Glucose 70 - 99 mg/dL 90 92 87  BUN 6 - 20 mg/dL 40(H) 42(H) 38(H)  Creatinine 0.61 - 1.24 mg/dL 4.08(H) 4.05(H) 4.19(H)  Sodium 135 - 145 mmol/L 139 137 138  Potassium 3.5 - 5.1 mmol/L 4.8 5.1 5.2(H)  Chloride 98 - 111 mmol/L 111 109 110  CO2 22 - 32 mmol/L 20(L) 21(L) 21(L)  Calcium 8.9 - 10.3 mg/dL 7.8(L) 7.5(L) 8.0(L)  Total Protein 6.5 - 8.1 g/dL 6.3(L) 6.4(L) 6.7  Total Bilirubin 0.3 - 1.2 mg/dL 0.5 0.6 0.5  Alkaline Phos 38 - 126 U/L 76 78 80  AST 15 - 41 U/L 16 14(L) 13(L)  ALT 0 - 44 U/L _0 I have personally reviewed his scans and discussed with him.     ASSESSMENT & PLAN:   Kappa light chain myeloma ([place]) 1.  Kappa light chain myeloma, high risk, stage III: -Presentation with acute renal failure and hypercalcemia.  M spike was 0.4 g.  Immunofixation did not show any immunoglobulin heavy chain.  Kappa free light chains was 12,085.  Free light chain ratio was 1981 and LDH of 224. -Bone marrow biopsy on 06/16/2018 shows 87% atypical plasma cells.  Cytogenetics shows complex chromosome abnormalities.  Gain of 1 q. was seen.  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 - Myeloma labs from 11/01/2018 shows no M spike.  Free light chain ratio improved to 95.5.  Kappa light chains improved to 1347.   - MRI of the brain for left-sided facial numbness was negative. - He was evaluated by Dr. Aris Lot at [facility] for transplant on 12/07/2018. - He is continuing to tolerate treatments very well. -We reviewed results of myeloma panel from 12/08/2018.  Kappa light chains are down to 272 and ratio is down to 10.37. - He has gotten very good response.  He will proceed  with treatment today.  He will come back in 1 week with labs and treatment.  2.  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 -He will continue Retacrit 20,000 units weekly.  3.  Hyperkalemia: - Potassium improved to 4.8.  He will continue Veltassa.  4.  ID prophylaxis: - He had skin rash with acyclovir.  He will continue Valtrex 500 mg twice daily.  5.  Low back pain: - He will continue Dilaudid 4 mg every 8 hours which is controlling his pain.  6.  Lower extremity edema: -He will continue torsemide 20 mg daily.  Total time spent is 25 minutes with more than 50% of the time spent face-to-face discussing treatment plan, counseling and coordination of care.    Orders placed this encounter:  No orders of the defined types were placed in this encounter.     Derek Jack, MD [place]  [place] [phone]

## 2018-12-20 ENCOUNTER — Ambulatory Visit (HOSPITAL_COMMUNITY): Payer: Medicare Other

## 2018-12-20 ENCOUNTER — Ambulatory Visit (HOSPITAL_COMMUNITY): Payer: Medicare Other | Admitting: Hematology

## 2018-12-20 ENCOUNTER — Other Ambulatory Visit (HOSPITAL_COMMUNITY): Payer: Medicare Other

## 2018-12-23 ENCOUNTER — Encounter (HOSPITAL_COMMUNITY): Payer: Self-pay | Admitting: Hematology

## 2018-12-23 NOTE — Assessment & Plan Note (Signed)
1.  Kappa light chain myeloma, high risk, stage III: -Presentation with acute renal failure and hypercalcemia.  M spike was 0.4 g.  Immunofixation did not show any immunoglobulin heavy chain.  Kappa free light chains was 12,085.  Free light chain ratio was 1981 and LDH of 224. -Bone marrow biopsy on 06/16/2018 shows 87% atypical plasma cells.  Cytogenetics shows complex chromosome abnormalities.  Gain of 1 q. was seen.  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 - Myeloma labs from 11/01/2018 shows no M spike.  Free light chain ratio improved to 95.5.  Kappa light chains improved to 1347.   - MRI of the brain for left-sided facial numbness was negative. - He was evaluated by Dr. Lambird at [facility] for transplant on 12/07/2018. - He is continuing to tolerate treatments very well. -We reviewed results of myeloma panel from 12/08/2018.  Kappa light chains are down to 272 and ratio is down to 10.37. - He has gotten very good response.  He will proceed with treatment today.  He will come back in 1 week with labs and treatment.  2.  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 -He will continue Retacrit 20,000 units weekly.  3.  Hyperkalemia: - Potassium improved to 4.8.  He will continue Veltassa.  4.  ID prophylaxis: - He had skin rash with acyclovir.  He will continue Valtrex 500 mg twice daily.  5.  Low back pain: - He will continue Dilaudid 4 mg every 8 hours which is controlling his pain.  6.  Lower extremity edema: -He will continue torsemide 20 mg daily. 

## 2018-12-27 ENCOUNTER — Other Ambulatory Visit (HOSPITAL_COMMUNITY): Payer: Self-pay | Admitting: *Deleted

## 2018-12-27 ENCOUNTER — Inpatient Hospital Stay (HOSPITAL_COMMUNITY): Payer: Medicare Other

## 2018-12-27 ENCOUNTER — Other Ambulatory Visit (HOSPITAL_COMMUNITY): Payer: Medicare Other

## 2018-12-27 ENCOUNTER — Ambulatory Visit (HOSPITAL_COMMUNITY): Payer: Medicare Other

## 2018-12-27 ENCOUNTER — Ambulatory Visit (HOSPITAL_COMMUNITY): Payer: Medicare Other | Admitting: Hematology

## 2018-12-27 ENCOUNTER — Encounter (HOSPITAL_COMMUNITY): Payer: Self-pay | Admitting: Hematology

## 2018-12-27 ENCOUNTER — Inpatient Hospital Stay (HOSPITAL_BASED_OUTPATIENT_CLINIC_OR_DEPARTMENT_OTHER): Payer: Medicare Other | Admitting: Hematology

## 2018-12-27 ENCOUNTER — Other Ambulatory Visit: Payer: Self-pay

## 2018-12-27 VITALS — BP 152/85 | HR 77 | Temp 97.7°F | Resp 18 | Wt 172.6 lb

## 2018-12-27 DIAGNOSIS — C9000 Multiple myeloma not having achieved remission: Secondary | ICD-10-CM

## 2018-12-27 DIAGNOSIS — Z5112 Encounter for antineoplastic immunotherapy: Secondary | ICD-10-CM | POA: Diagnosis not present

## 2018-12-27 DIAGNOSIS — N185 Chronic kidney disease, stage 5: Secondary | ICD-10-CM

## 2018-12-27 LAB — COMPREHENSIVE METABOLIC PANEL
ALT: 9 U/L (ref 0–44)
AST: 14 U/L — ABNORMAL LOW (ref 15–41)
Albumin: 3.9 g/dL (ref 3.5–5.0)
Alkaline Phosphatase: 81 U/L (ref 38–126)
Anion gap: 9 (ref 5–15)
BUN: 41 mg/dL — ABNORMAL HIGH (ref 6–20)
CO2: 22 mmol/L (ref 22–32)
Calcium: 7.9 mg/dL — ABNORMAL LOW (ref 8.9–10.3)
Chloride: 109 mmol/L (ref 98–111)
Creatinine, Ser: 4.23 mg/dL — ABNORMAL HIGH (ref 0.61–1.24)
GFR calc Af Amer: 17 mL/min — ABNORMAL LOW (ref >60)
GFR calc non Af Amer: 14 mL/min — ABNORMAL LOW (ref >60)
Glucose, Bld: 90 mg/dL (ref 70–99)
Potassium: 4.7 mmol/L (ref 3.5–5.1)
Sodium: 140 mmol/L (ref 135–145)
Total Bilirubin: 0.5 mg/dL (ref 0.3–1.2)
Total Protein: 6.4 g/dL — ABNORMAL LOW (ref 6.5–8.1)

## 2018-12-27 LAB — CBC WITH DIFFERENTIAL/PLATELET
Abs Immature Granulocytes: 0.01 10*3/uL (ref 0.00–0.07)
Basophils Absolute: 0 10*3/uL (ref 0.0–0.1)
Basophils Relative: 1 %
Eosinophils Absolute: 0.2 10*3/uL (ref 0.0–0.5)
Eosinophils Relative: 5 %
HCT: 32.7 % — ABNORMAL LOW (ref 39.0–52.0)
Hemoglobin: 10.2 g/dL — ABNORMAL LOW (ref 13.0–17.0)
Immature Granulocytes: 0 %
Lymphocytes Relative: 18 %
Lymphs Abs: 0.8 10*3/uL (ref 0.7–4.0)
MCH: 33.1 pg (ref 26.0–34.0)
MCHC: 31.2 g/dL (ref 30.0–36.0)
MCV: 106.2 fL — ABNORMAL HIGH (ref 80.0–100.0)
Monocytes Absolute: 0.7 10*3/uL (ref 0.1–1.0)
Monocytes Relative: 17 %
Neutro Abs: 2.5 10*3/uL (ref 1.7–7.7)
Neutrophils Relative %: 59 %
Platelets: 201 10*3/uL (ref 150–400)
RBC: 3.08 MIL/uL — ABNORMAL LOW (ref 4.22–5.81)
RDW: 18.1 % — ABNORMAL HIGH (ref 11.5–15.5)
WBC: 4.3 10*3/uL (ref 4.0–10.5)
nRBC: 0 % (ref 0.0–0.2)

## 2018-12-27 LAB — MAGNESIUM: Magnesium: 2 mg/dL (ref 1.7–2.4)

## 2018-12-27 MED ORDER — EPOETIN ALFA-EPBX 10000 UNIT/ML IJ SOLN
20000.0000 [IU] | Freq: Once | INTRAMUSCULAR | Status: AC
  Administered 2018-12-27: 20000 [IU] via SUBCUTANEOUS
  Filled 2018-12-27: qty 2

## 2018-12-27 MED ORDER — ONDANSETRON HCL 4 MG PO TABS
8.0000 mg | ORAL_TABLET | Freq: Once | ORAL | Status: AC
  Administered 2018-12-27: 12:00:00 8 mg via ORAL

## 2018-12-27 MED ORDER — HYDROMORPHONE HCL 4 MG PO TABS: 4.0000 mg | ORAL_TABLET | Freq: Three times a day (TID) | ORAL | 0 refills | Status: DC | PRN

## 2018-12-27 MED ORDER — DEXAMETHASONE 4 MG PO TABS
20.0000 mg | ORAL_TABLET | Freq: Once | ORAL | Status: AC
  Administered 2018-12-27: 12:00:00 20 mg via ORAL
  Filled 2018-12-27: qty 5

## 2018-12-27 MED ORDER — HEPARIN SOD (PORK) LOCK FLUSH 100 UNIT/ML IV SOLN
500.0000 [IU] | Freq: Once | INTRAVENOUS | Status: AC | PRN
  Administered 2018-12-27: 12:00:00 500 [IU]

## 2018-12-27 MED ORDER — SODIUM CHLORIDE 0.9% FLUSH
10.0000 mL | INTRAVENOUS | Status: DC | PRN
  Administered 2018-12-27: 12:00:00 10 mL
  Filled 2018-12-27: qty 10

## 2018-12-27 MED ORDER — BORTEZOMIB CHEMO SQ INJECTION 3.5 MG (2.5MG/ML)
1.3000 mg/m2 | Freq: Once | INTRAMUSCULAR | Status: AC
  Administered 2018-12-27: 13:00:00 2.5 mg via SUBCUTANEOUS
  Filled 2018-12-27: qty 1

## 2018-12-27 NOTE — Progress Notes (Signed)
[name], [place] [address]   CLINIC:  Medical Oncology/Hematology  PCP:  Rosita Fire, MD [place] [id number] [phone]   REASON FOR VISIT: Follow-up for Multiple myeloma    BRIEF ONCOLOGIC HISTORY:  Oncology History  Kappa light chain myeloma ([place])  06/16/2018 Initial Diagnosis   Kappa light chain myeloma ([place])   06/17/2018 -  Chemotherapy   The patient had palonosetron (ALOXI) injection 0.25 mg, 0.25 mg, Intravenous,  Once, 27 of 28 cycles Administration: 0.25 mg (06/17/2018), 0.25 mg (06/23/2018), 0.25 mg (06/30/2018), 0.25 mg (07/07/2018), 0.25 mg (07/14/2018), 0.25 mg (07/22/2018), 0.25 mg (07/29/2018), 0.25 mg (08/06/2018), 0.25 mg (08/13/2018), 0.25 mg (08/20/2018), 0.25 mg (08/27/2018), 0.25 mg (09/06/2018), 0.25 mg (09/13/2018), 0.25 mg (09/20/2018), 0.25 mg (09/27/2018), 0.25 mg (10/04/2018), 0.25 mg (10/11/2018), 0.25 mg (10/18/2018), 0.25 mg (10/25/2018), 0.25 mg (11/01/2018), 0.25 mg (11/08/2018), 0.25 mg (11/15/2018), 0.25 mg (11/22/2018), 0.25 mg (11/29/2018), 0.25 mg (12/08/2018), 0.25 mg (12/16/2018) bortezomib SQ (VELCADE) chemo injection 2.5 mg, 1.3 mg/m2 = 2.5 mg, Subcutaneous,  Once, 27 of 28 cycles Administration: 2.5 mg (06/17/2018), 2.5 mg (06/23/2018), 2.5 mg (06/30/2018), 2.5 mg (07/07/2018), 2.5 mg (07/14/2018), 2.5 mg (07/22/2018), 2.5 mg (07/29/2018), 2.5 mg (08/06/2018), 2.5 mg (08/13/2018), 2.5 mg (08/20/2018), 2.5 mg (08/27/2018), 2.5 mg (09/06/2018), 2.5 mg (09/13/2018), 2.5 mg (09/20/2018), 2.5 mg (09/27/2018), 2.5 mg (10/04/2018), 2.5 mg (10/11/2018), 2.5 mg (10/18/2018), 2.5 mg (10/25/2018), 2.5 mg (11/01/2018), 2.5 mg (11/08/2018), 2.5 mg (11/15/2018), 2.5 mg (11/22/2018), 2.5 mg (11/29/2018), 2.5 mg (12/08/2018), 2.5 mg (12/16/2018), 2.5 mg (12/27/2018) cyclophosphamide (CYTOXAN) 300 mg in sodium chloride 0.9 % 250 mL chemo infusion, 300 mg (100 % of original dose 300 mg), Intravenous,  Once, 26 of 26 cycles Dose modification: 300 mg (original dose  300 mg, Cycle 1, Reason: Change in SCr/CrCl), 500 mg (original dose 500 mg, Cycle 2, Reason: Provider Judgment), 500 mg (original dose 500 mg, Cycle 3, Reason: Change in SCr/CrCl) Administration: 300 mg (06/17/2018), 500 mg (06/23/2018), 500 mg (06/30/2018), 600 mg (07/07/2018), 600 mg (07/14/2018), 600 mg (07/22/2018), 600 mg (07/29/2018), 600 mg (08/06/2018), 600 mg (08/13/2018), 600 mg (08/20/2018), 600 mg (08/27/2018), 600 mg (09/06/2018), 600 mg (09/13/2018), 600 mg (09/20/2018), 600 mg (09/27/2018), 600 mg (10/04/2018), 600 mg (10/11/2018), 600 mg (10/18/2018), 600 mg (10/25/2018), 600 mg (11/01/2018), 600 mg (11/08/2018), 600 mg (11/15/2018), 600 mg (11/22/2018), 600 mg (11/29/2018), 600 mg (12/08/2018), 600 mg (12/16/2018)  for chemotherapy treatment.       INTERVAL HISTORY:  [name] 58 y.o. male seen for follow-up of multiple myeloma.  Numbness in the feet has been stable.  Appetite is 50%.  Energy levels are 75%.  Low back pain and right shoulder pain is also well controlled with Dilaudid.  He is taking Veltassa on regular basis.  Denies any fevers or chills.   REVIEW OF SYSTEMS:  Review of Systems  Neurological: Positive for numbness.  All other systems reviewed and are negative.    PAST MEDICAL/SURGICAL HISTORY:  Past Medical History:  Diagnosis Date  . Allergy   . Arthritis    neck and back  . Blood transfusion without reported diagnosis   . BPH (benign prostatic hyperplasia)   . Cancer [facility]) 2004   testicle  . Chronic kidney disease    kidney stones  . Left lumbar radiculopathy 06/12/2016  . 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 Medical history non-contributory    Pt has scattered thoughts and uncertain of past medical history  . 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 Stones, urinary tract   . Substance abuse ([place])    prescribed oxydcodone- 10 years   Past Surgical History:  Procedure Laterality Date  . BACK SURGERY     x5  . CERVICAL DISC SURGERY     x2  . COLONOSCOPY WITH PROPOFOL N/A  08/11/2016   Dr. Gala Romney: non-bleeding internal hemorrhoids, one 4 mm hyperplastic rectal polyp, diverticulosis in entire examined colon  . POLYPECTOMY  08/11/2016   Procedure: POLYPECTOMY;  Surgeon: [name], MD;  Location: [facility] ENDO SUITE;  Service: Endoscopy;;  colon  . PORTACATH PLACEMENT Left 09/20/2018   Procedure: INSERTION PORT-A-CATH (catheter attached left subclavian);  Surgeon: Virl Cagey, MD;  Location: [facility] ORS;  Service: General;  Laterality: Left;  . testicular cancer  2004  . TONSILLECTOMY       SOCIAL HISTORY:  Social History   Socioeconomic History  . Marital status: Single    Spouse name: Not on file  . Number of children: 1  . Years of education: 16  . Highest education level: Not on file  Occupational History  . Occupation: retired    Comment: Psychologist, prison and probation services  . Occupation: disabled  Social Needs  . Financial resource strain: Not on file  . Food insecurity    Worry: Not on file    Inability: Not on file  . Transportation needs    Medical: Not on file    Non-medical: Not on file  Tobacco Use  . Smoking status: Current Every Day Smoker    Packs/day: 1.00    Years: 40.00    Pack years: 40.00    Types: Cigarettes    Start date: 03/31/1974  . Smokeless tobacco: Never Used  Substance and Sexual Activity  . Alcohol use: Yes    Comment: Occasional  . Drug use: No    Types: Cocaine    Comment: Remote hx of cocaine, quit 1989  . Sexual activity: Yes  Lifestyle  . Physical activity    Days per week: Not on file    Minutes per session: Not on file  . Stress: Not on file  Relationships  . Social Herbalist on phone: Not on file    Gets together: Not on file    Attends religious service: Not on file    Active member of club or organization: Not on file    Attends meetings of clubs or organizations: Not on file    Relationship status: Not on file  . Intimate partner violence    Fear of current or ex partner: No    Emotionally abused: No     Physically abused: No    Forced sexual activity: No  Other Topics Concern  . Not on file  Social History Narrative   [employer] for 12 years   [employer] for 55 years      Never married   One daughter      Lives alone   Retail banker   Right-handed   Occasional caffeine use          FAMILY HISTORY:  Family History  Problem Relation Age of Onset  . COPD Mother   . Heart disease Mother   . Other Father        Never knew his father.  . Thyroid disease Sister   . Arthritis Sister   . Heart disease Sister        bypass  . Colon cancer Neg  Hx     CURRENT MEDICATIONS:  Outpatient Encounter Medications as of 12/27/2018  Medication Sig  . bortezomib IV (VELCADE) 3.5 MG injection Inject 1.3 mg/m2 into the vein once a week.   . calcitRIOL (ROCALTROL) 0.5 MCG capsule Take 0.5 mcg by mouth daily.   . CYCLOPHOSPHAMIDE IV Inject into the vein once a week.  Marland Kitchen Epoetin Alfa (PROCRIT IJ) Inject as directed. Unsure of dosage- gets this once weekly  . Misc. Devices MISC Please provide patient with rollaider.  Dx: multiple myeloma C90.0  . Patiromer Sorbitex Calcium (VELTASSA PO) Take by mouth 2 (two) times a week.   . senna (SENOKOT) 8.6 MG tablet Take 1 tablet by mouth 2 (two) times daily.   . sodium bicarbonate 650 MG tablet Take 1,300 mg by mouth 2 (two) times daily.  . valACYclovir (VALTREX) 500 MG tablet Take 1 tablet (500 mg total) by mouth 2 (two) times daily.  . [DISCONTINUED] HYDROmorphone (DILAUDID) 4 MG tablet Take 1 tablet (4 mg total) by mouth every 8 (eight) hours as needed for severe pain.  . diphenhydrAMINE (BENADRYL) 25 MG tablet Take 1 tablet (25 mg total) by mouth as needed for itching. (Patient not taking: Reported on 12/08/2018)  . torsemide (DEMADEX) 20 MG tablet TAKING 1 TABLET DAILY, AND 2 TABLETS ONLY IF HIS ANKLES ARE REALLY SWOLLEN (Patient not taking: Reported on 12/27/2018)   No facility-administered encounter medications on file as of 12/27/2018.      ALLERGIES:  Allergies  Allergen Reactions  . Oxycodone-Acetaminophen   . Acetaminophen Nausea Only and Other (See Comments)    Elevated liver enzymes  . Cymbalta [Duloxetine Hcl] Swelling    Facial swelling  . Diclofenac Sodium Swelling       . Diclofenac Sodium Swelling  . Imodium [Loperamide] Swelling    facial  . Lyrica [Pregabalin] Swelling  . Morphine Other (See Comments)    Bradycardia   . Vioxx [Rofecoxib] Swelling  . Aleve [Naproxen] Swelling    eye     PHYSICAL EXAM:  ECOG Performance status: 1  Vitals:   12/27/18 0933 12/27/18 1038  BP: (!) 152/85 (!) 152/85  Pulse: 77 77  Resp: 18 18  Temp: 97.7 F (36.5 C) 97.7 F (36.5 C)  SpO2: 100% 100%   Filed Weights   12/27/18 0933 12/27/18 1038  Weight: 172 lb 9.6 oz (78.3 kg) 172 lb 9.6 oz (78.3 kg)    Physical Exam Vitals signs reviewed.  Constitutional:      Appearance: Normal appearance. He is normal weight.  Cardiovascular:     Rate and Rhythm: Normal rate and regular rhythm.     Heart sounds: Normal heart sounds.  Pulmonary:     Effort: Pulmonary effort is normal.     Breath sounds: Normal breath sounds.  Abdominal:     General: Bowel sounds are normal.     Palpations: Abdomen is soft.  Musculoskeletal: Normal range of motion.     Right lower leg: No edema.     Left lower leg: No edema.  Skin:    General: Skin is warm and dry.  Neurological:     Mental Status: He is alert and oriented to person, place, and time. Mental status is at baseline.  Psychiatric:        Mood and Affect: Mood normal.        Behavior: Behavior normal.      LABORATORY DATA:  I have reviewed the labs as listed.  CBC    Component  Value Date/Time   WBC 4.3 12/27/2018 0947   RBC 3.08 (L) 12/27/2018 0947   HGB 10.2 (L) 12/27/2018 0947   HCT 32.7 (L) 12/27/2018 0947   PLT 201 12/27/2018 0947   MCV 106.2 (H) 12/27/2018 0947   MCH 33.1 12/27/2018 0947   MCHC 31.2 12/27/2018 0947   RDW 18.1 (H) 12/27/2018  0947   LYMPHSABS 0.8 12/27/2018 0947   MONOABS 0.7 12/27/2018 0947   EOSABS 0.2 12/27/2018 0947   BASOSABS 0.0 12/27/2018 0947   CMP Latest Ref Rng & Units 12/27/2018 12/16/2018 12/08/2018  Glucose 70 - 99 mg/dL 90 90 92  BUN 6 - 20 mg/dL 41(H) 40(H) 42(H)  Creatinine 0.61 - 1.24 mg/dL 4.23(H) 4.08(H) 4.05(H)  Sodium 135 - 145 mmol/L 140 139 137  Potassium 3.5 - 5.1 mmol/L 4.7 4.8 5.1  Chloride 98 - 111 mmol/L 109 111 109  CO2 22 - 32 mmol/L 22 20(L) 21(L)  Calcium 8.9 - 10.3 mg/dL 7.9(L) 7.8(L) 7.5(L)  Total Protein 6.5 - 8.1 g/dL 6.4(L) 6.3(L) 6.4(L)  Total Bilirubin 0.3 - 1.2 mg/dL 0.5 0.5 0.6  Alkaline Phos 38 - 126 U/L 81 76 78  AST 15 - 41 U/L 14(L) 16 14(L)  ALT 0 - 44 U/L _0 I have personally reviewed his scans and discussed with him.     ASSESSMENT & PLAN:   Kappa light chain myeloma ([place]) 1.  Kappa light chain myeloma, high risk, stage III: -Presentation with acute renal failure and hypercalcemia.  M spike was 0.4 g.  Immunofixation did not show any immunoglobulin heavy chain.  Kappa free light chains was 12,085.  Free light chain ratio was 1981 and LDH of 224. -Bone marrow biopsy on 06/16/2018 shows 87% atypical plasma cells.  Cytogenetics shows complex chromosome abnormalities.  Gain of 1 q. was seen.  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 - Myeloma labs from 11/01/2018 shows no M spike.  Free light chain ratio improved to 95.5.  Kappa light chains improved to 1347.   - MRI of the brain for left-sided facial numbness was negative. - He was evaluated by Dr. Aris Lot at [facility] for transplant on 12/07/2018. - He is continuing to tolerate treatments very well. -Myeloma panel from 12/08/2018 shows kappa light chains down to 270 and ratio down to 10.37. - He has gotten VG PR.  He will proceed with dexamethasone and Velcade today.  I will hold cyclophosphamide as there will be difficulty collecting stem  cells.  I will reach out to Dr. Aris Lot. - I will check his myeloma panel next week and see him back in 2 weeks for follow-up.  2.  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 -He will continue Retacrit 20,000 units weekly.  3.  Hyperkalemia: - He will continue Veltassa.  Potassium is normal.  4.  ID prophylaxis: - He had skin rash with acyclovir.  He will continue Valtrex 500 mg twice daily.  5.  Low back pain: - He will continue Dilaudid 4 mg every 8 hours which is controlling his pain.  6.  Lower extremity edema: -He will continue torsemide 20 mg daily.  Total time spent is 25 minutes with more than 50% of the time spent face-to-face discussing treatment plan, counseling and coordination of care.    Orders placed this encounter:  Orders Placed This Encounter  Procedures  . CBC with Differential/Platelet  .  Comprehensive metabolic panel  . Protein electrophoresis, serum  . Kappa/lambda light chains  . Lactate dehydrogenase      Derek Jack, MD [place] [phone]

## 2018-12-27 NOTE — Assessment & Plan Note (Signed)
1.  Kappa light chain myeloma, high risk, stage III: -Presentation with acute renal failure and hypercalcemia.  M spike was 0.4 g.  Immunofixation did not show any immunoglobulin heavy chain.  Kappa free light chains was 12,085.  Free light chain ratio was 1981 and LDH of 224. -Bone marrow biopsy on 06/16/2018 shows 87% atypical plasma cells.  Cytogenetics shows complex chromosome abnormalities.  Gain of 1 q. was seen.  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 - Myeloma labs from 11/01/2018 shows no M spike.  Free light chain ratio improved to 95.5.  Kappa light chains improved to 1347.   - MRI of the brain for left-sided facial numbness was negative. - He was evaluated by Dr. Aris Lot at [facility] for transplant on 12/07/2018. - He is continuing to tolerate treatments very well. -Myeloma panel from 12/08/2018 shows kappa light chains down to 270 and ratio down to 10.37. - He has gotten VG PR.  He will proceed with dexamethasone and Velcade today.  I will hold cyclophosphamide as there will be difficulty collecting stem cells.  I will reach out to Dr. Aris Lot. - I will check his myeloma panel next week and see him back in 2 weeks for follow-up.  2.  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 -He will continue Retacrit 20,000 units weekly.  3.  Hyperkalemia: - He will continue Veltassa.  Potassium is normal.  4.  ID prophylaxis: - He had skin rash with acyclovir.  He will continue Valtrex 500 mg twice daily.  5.  Low back pain: - He will continue Dilaudid 4 mg every 8 hours which is controlling his pain.  6.  Lower extremity edema: -He will continue torsemide 20 mg daily.

## 2018-12-27 NOTE — Patient Instructions (Signed)
[facility] Discharge Instructions for Patients Receiving Chemotherapy  Today you received the following chemotherapy agents   To help prevent nausea and vomiting after your treatment, we encourage you to take your nausea medication   If you develop nausea and vomiting that is not controlled by your nausea medication, call the clinic.   BELOW ARE SYMPTOMS THAT SHOULD BE REPORTED IMMEDIATELY:  *FEVER GREATER THAN 100.5 F  *CHILLS WITH OR WITHOUT FEVER  NAUSEA AND VOMITING THAT IS NOT CONTROLLED WITH YOUR NAUSEA MEDICATION  *UNUSUAL SHORTNESS OF BREATH  *UNUSUAL BRUISING OR BLEEDING  TENDERNESS IN MOUTH AND THROAT WITH OR WITHOUT PRESENCE OF ULCERS  *URINARY PROBLEMS  *BOWEL PROBLEMS  UNUSUAL RASH Items with * indicate a potential emergency and should be followed up as soon as possible.  Feel free to call the clinic should you have any questions or concerns. The clinic phone number is [phone].  Please show the CHEMO ALERT CARD at check-in to the Emergency Department and triage nurse.   

## 2018-12-27 NOTE — Patient Instructions (Addendum)
[place] at [facility] Discharge Instructions  You were seen today by Dr. Delton Coombes. He went over your recent lab results. He will stop your IV treatment and continue to only give you the Velcade injections. He will see you back in 2 weeks for labs and follow up.   Thank you for choosing [place] at [facility] to provide your oncology and hematology care.  To afford each patient quality time with our provider, please arrive at least 15 minutes before your scheduled appointment time.   If you have a lab appointment with the [place] please come in thru the  Main Entrance and check in at the main information desk  You need to re-schedule your appointment should you arrive 10 or more minutes late.  We strive to give you quality time with our providers, and arriving late affects you and other patients whose appointments are after yours.  Also, if you no show three or more times for appointments you may be dismissed from the clinic at the providers discretion.     Again, thank you for choosing [facility].  Our hope is that these requests will decrease the amount of time that you wait before being seen by our physicians.       _____________________________________________________________  Should you have questions after your visit to [facility], please contact our office at [phone] between the hours of 8:00 a.m. and 4:30 p.m.  Voicemails left after 4:00 p.m. will not be returned until the following business day.  For prescription refill requests, have your pharmacy contact our office and allow 72 hours.    Cancer Center Support Programs:   > Cancer Support Group  2nd Tuesday of the month 1pm-2pm, Journey Room

## 2018-12-27 NOTE — Progress Notes (Signed)
Labs reviewed with MD today. Will only give velcade, PO Dex and retacrit weekly per MD. NO cytoxan for now per MD.   Treatment given per orders. Patient tolerated it well without problems. Vitals stable and discharged home from clinic ambulatory. Follow up as scheduled.

## 2019-01-03 ENCOUNTER — Inpatient Hospital Stay (HOSPITAL_COMMUNITY): Payer: Medicare Other

## 2019-01-03 ENCOUNTER — Inpatient Hospital Stay (HOSPITAL_COMMUNITY): Payer: Medicare Other | Attending: Hematology

## 2019-01-03 ENCOUNTER — Other Ambulatory Visit: Payer: Self-pay

## 2019-01-03 ENCOUNTER — Encounter (HOSPITAL_COMMUNITY): Payer: Self-pay

## 2019-01-03 VITALS — BP 128/56 | HR 57 | Temp 97.5°F | Resp 18 | Wt 176.6 lb

## 2019-01-03 DIAGNOSIS — D631 Anemia in chronic kidney disease: Secondary | ICD-10-CM | POA: Diagnosis not present

## 2019-01-03 DIAGNOSIS — C9000 Multiple myeloma not having achieved remission: Secondary | ICD-10-CM | POA: Diagnosis present

## 2019-01-03 DIAGNOSIS — E875 Hyperkalemia: Secondary | ICD-10-CM | POA: Insufficient documentation

## 2019-01-03 DIAGNOSIS — M545 Low back pain: Secondary | ICD-10-CM | POA: Insufficient documentation

## 2019-01-03 DIAGNOSIS — R600 Localized edema: Secondary | ICD-10-CM | POA: Diagnosis not present

## 2019-01-03 DIAGNOSIS — Z5112 Encounter for antineoplastic immunotherapy: Secondary | ICD-10-CM | POA: Diagnosis present

## 2019-01-03 DIAGNOSIS — N189 Chronic kidney disease, unspecified: Secondary | ICD-10-CM | POA: Diagnosis not present

## 2019-01-03 DIAGNOSIS — N185 Chronic kidney disease, stage 5: Secondary | ICD-10-CM

## 2019-01-03 LAB — CBC WITH DIFFERENTIAL/PLATELET
Abs Immature Granulocytes: 0.01 10*3/uL (ref 0.00–0.07)
Basophils Absolute: 0 10*3/uL (ref 0.0–0.1)
Basophils Relative: 1 %
Eosinophils Absolute: 0.3 10*3/uL (ref 0.0–0.5)
Eosinophils Relative: 5 %
HCT: 33 % — ABNORMAL LOW (ref 39.0–52.0)
Hemoglobin: 10.4 g/dL — ABNORMAL LOW (ref 13.0–17.0)
Immature Granulocytes: 0 %
Lymphocytes Relative: 21 %
Lymphs Abs: 1 10*3/uL (ref 0.7–4.0)
MCH: 33.7 pg (ref 26.0–34.0)
MCHC: 31.5 g/dL (ref 30.0–36.0)
MCV: 106.8 fL — ABNORMAL HIGH (ref 80.0–100.0)
Monocytes Absolute: 0.8 10*3/uL (ref 0.1–1.0)
Monocytes Relative: 16 %
Neutro Abs: 2.8 10*3/uL (ref 1.7–7.7)
Neutrophils Relative %: 57 %
Platelets: 187 10*3/uL (ref 150–400)
RBC: 3.09 MIL/uL — ABNORMAL LOW (ref 4.22–5.81)
RDW: 18.8 % — ABNORMAL HIGH (ref 11.5–15.5)
WBC: 4.9 10*3/uL (ref 4.0–10.5)
nRBC: 0 % (ref 0.0–0.2)

## 2019-01-03 LAB — COMPREHENSIVE METABOLIC PANEL
ALT: 8 U/L (ref 0–44)
AST: 12 U/L — ABNORMAL LOW (ref 15–41)
Albumin: 3.7 g/dL (ref 3.5–5.0)
Alkaline Phosphatase: 72 U/L (ref 38–126)
Anion gap: 9 (ref 5–15)
BUN: 45 mg/dL — ABNORMAL HIGH (ref 6–20)
CO2: 21 mmol/L — ABNORMAL LOW (ref 22–32)
Calcium: 8.1 mg/dL — ABNORMAL LOW (ref 8.9–10.3)
Chloride: 109 mmol/L (ref 98–111)
Creatinine, Ser: 4.34 mg/dL — ABNORMAL HIGH (ref 0.61–1.24)
GFR calc Af Amer: 16 mL/min — ABNORMAL LOW (ref >60)
GFR calc non Af Amer: 14 mL/min — ABNORMAL LOW (ref >60)
Glucose, Bld: 91 mg/dL (ref 70–99)
Potassium: 4.7 mmol/L (ref 3.5–5.1)
Sodium: 139 mmol/L (ref 135–145)
Total Bilirubin: 0.5 mg/dL (ref 0.3–1.2)
Total Protein: 6.3 g/dL — ABNORMAL LOW (ref 6.5–8.1)

## 2019-01-03 LAB — LACTATE DEHYDROGENASE: LDH: 177 U/L (ref 98–192)

## 2019-01-03 MED ORDER — EPOETIN ALFA-EPBX 10000 UNIT/ML IJ SOLN
20000.0000 [IU] | Freq: Once | INTRAMUSCULAR | Status: AC
  Administered 2019-01-03: 20000 [IU] via SUBCUTANEOUS
  Filled 2019-01-03: qty 2

## 2019-01-03 MED ORDER — DEXAMETHASONE 4 MG PO TABS
20.0000 mg | ORAL_TABLET | Freq: Once | ORAL | Status: AC
  Administered 2019-01-03: 20 mg via ORAL
  Filled 2019-01-03: qty 5

## 2019-01-03 MED ORDER — ONDANSETRON HCL 4 MG PO TABS
8.0000 mg | ORAL_TABLET | Freq: Once | ORAL | Status: AC
  Administered 2019-01-03: 8 mg via ORAL
  Filled 2019-01-03: qty 2

## 2019-01-03 MED ORDER — BORTEZOMIB CHEMO SQ INJECTION 3.5 MG (2.5MG/ML)
1.3000 mg/m2 | Freq: Once | INTRAMUSCULAR | Status: AC
  Administered 2019-01-03: 2.5 mg via SUBCUTANEOUS
  Filled 2019-01-03: qty 1

## 2019-01-03 NOTE — Progress Notes (Signed)
To treatment room for injection.  Reviewed side effects of medication with all questions asked and answered.  No s/s of distress noted.  No complaints voiced.   Patient tolerated injection with no complaints voiced.  Site clean and dry with no bruising or swelling noted at site.  Band aid applied.  Vss with discharge and left ambulatory with no s/s of distress noted.

## 2019-01-04 LAB — PROTEIN ELECTROPHORESIS, SERUM
A/G Ratio: 1.7 (ref 0.7–1.7)
Albumin ELP: 3.6 g/dL (ref 2.9–4.4)
Alpha-1-Globulin: 0.1 g/dL (ref 0.0–0.4)
Alpha-2-Globulin: 0.7 g/dL (ref 0.4–1.0)
Beta Globulin: 0.6 g/dL — ABNORMAL LOW (ref 0.7–1.3)
Gamma Globulin: 0.7 g/dL (ref 0.4–1.8)
Globulin, Total: 2.1 g/dL — ABNORMAL LOW (ref 2.2–3.9)
Total Protein ELP: 5.7 g/dL — ABNORMAL LOW (ref 6.0–8.5)

## 2019-01-04 LAB — KAPPA/LAMBDA LIGHT CHAINS
Kappa free light chain: 142.1 mg/L — ABNORMAL HIGH (ref 3.3–19.4)
Kappa, lambda light chain ratio: 4.78 — ABNORMAL HIGH (ref 0.26–1.65)
Lambda free light chains: 29.7 mg/L — ABNORMAL HIGH (ref 5.7–26.3)

## 2019-01-10 ENCOUNTER — Other Ambulatory Visit: Payer: Self-pay

## 2019-01-10 ENCOUNTER — Encounter (HOSPITAL_COMMUNITY): Payer: Self-pay | Admitting: Hematology

## 2019-01-10 ENCOUNTER — Inpatient Hospital Stay (HOSPITAL_COMMUNITY): Payer: Medicare Other | Attending: Hematology | Admitting: Hematology

## 2019-01-10 ENCOUNTER — Inpatient Hospital Stay (HOSPITAL_COMMUNITY): Payer: Medicare Other

## 2019-01-10 DIAGNOSIS — Z79899 Other long term (current) drug therapy: Secondary | ICD-10-CM | POA: Insufficient documentation

## 2019-01-10 DIAGNOSIS — C9000 Multiple myeloma not having achieved remission: Secondary | ICD-10-CM

## 2019-01-10 DIAGNOSIS — N185 Chronic kidney disease, stage 5: Secondary | ICD-10-CM

## 2019-01-10 DIAGNOSIS — Z5112 Encounter for antineoplastic immunotherapy: Secondary | ICD-10-CM | POA: Insufficient documentation

## 2019-01-10 LAB — COMPREHENSIVE METABOLIC PANEL
ALT: 11 U/L (ref 0–44)
AST: 15 U/L (ref 15–41)
Albumin: 4.2 g/dL (ref 3.5–5.0)
Alkaline Phosphatase: 77 U/L (ref 38–126)
Anion gap: 8 (ref 5–15)
BUN: 41 mg/dL — ABNORMAL HIGH (ref 6–20)
CO2: 23 mmol/L (ref 22–32)
Calcium: 8 mg/dL — ABNORMAL LOW (ref 8.9–10.3)
Chloride: 109 mmol/L (ref 98–111)
Creatinine, Ser: 4.35 mg/dL — ABNORMAL HIGH (ref 0.61–1.24)
GFR calc Af Amer: 16 mL/min — ABNORMAL LOW (ref >60)
GFR calc non Af Amer: 14 mL/min — ABNORMAL LOW (ref >60)
Glucose, Bld: 83 mg/dL (ref 70–99)
Potassium: 4.7 mmol/L (ref 3.5–5.1)
Sodium: 140 mmol/L (ref 135–145)
Total Bilirubin: 0.6 mg/dL (ref 0.3–1.2)
Total Protein: 6.8 g/dL (ref 6.5–8.1)

## 2019-01-10 LAB — CBC WITH DIFFERENTIAL/PLATELET
Abs Immature Granulocytes: 0.03 10*3/uL (ref 0.00–0.07)
Basophils Absolute: 0 10*3/uL (ref 0.0–0.1)
Basophils Relative: 0 %
Eosinophils Absolute: 0.2 10*3/uL (ref 0.0–0.5)
Eosinophils Relative: 4 %
HCT: 35.9 % — ABNORMAL LOW (ref 39.0–52.0)
Hemoglobin: 11.2 g/dL — ABNORMAL LOW (ref 13.0–17.0)
Immature Granulocytes: 1 %
Lymphocytes Relative: 19 %
Lymphs Abs: 1 10*3/uL (ref 0.7–4.0)
MCH: 33.4 pg (ref 26.0–34.0)
MCHC: 31.2 g/dL (ref 30.0–36.0)
MCV: 107.2 fL — ABNORMAL HIGH (ref 80.0–100.0)
Monocytes Absolute: 0.7 10*3/uL (ref 0.1–1.0)
Monocytes Relative: 14 %
Neutro Abs: 3.3 10*3/uL (ref 1.7–7.7)
Neutrophils Relative %: 62 %
Platelets: 194 10*3/uL (ref 150–400)
RBC: 3.35 MIL/uL — ABNORMAL LOW (ref 4.22–5.81)
RDW: 18.7 % — ABNORMAL HIGH (ref 11.5–15.5)
WBC: 5.3 10*3/uL (ref 4.0–10.5)
nRBC: 0 % (ref 0.0–0.2)

## 2019-01-10 LAB — MAGNESIUM: Magnesium: 2.1 mg/dL (ref 1.7–2.4)

## 2019-01-10 MED ORDER — BORTEZOMIB CHEMO SQ INJECTION 3.5 MG (2.5MG/ML)
1.3000 mg/m2 | Freq: Once | INTRAMUSCULAR | Status: AC
  Administered 2019-01-10: 16:00:00 2.5 mg via SUBCUTANEOUS
  Filled 2019-01-10: qty 1

## 2019-01-10 MED ORDER — ONDANSETRON HCL 4 MG PO TABS
8.0000 mg | ORAL_TABLET | Freq: Once | ORAL | Status: AC
  Administered 2019-01-10: 8 mg via ORAL
  Filled 2019-01-10: qty 2

## 2019-01-10 MED ORDER — DEXAMETHASONE 4 MG PO TABS
20.0000 mg | ORAL_TABLET | Freq: Once | ORAL | Status: AC
  Administered 2019-01-10: 20 mg via ORAL
  Filled 2019-01-10: qty 5

## 2019-01-10 NOTE — Progress Notes (Signed)
Md aware of SCr, patient is around his baseline for renal function.   Future treatment plan parameters updated to notify MD if SCr > 4.8 per MD.   Demetrius Charity, PharmD, [facility] Pharmacist Pharmacy Phone: [phone] 01/10/2019

## 2019-01-10 NOTE — Progress Notes (Signed)
Patient seen by Dr. Delton Coombes with lab review and to treat today.  No s/s of distress noted.    Hgb 11.2 today.   Patient tolerated injection with no complaints voiced.  Site clean and dry with no bruising or swelling noted at site.  Band aid applied.  Vss with discharge and left ambulatory with no s/s of distress noted.

## 2019-01-10 NOTE — Patient Instructions (Signed)
[place] at [facility] Discharge Instructions  You were seen today by Dr. Delton Coombes. He went over your recent lab results. Continue weekly injections. He will see you back in 2 weeks for labs and follow up.   Thank you for choosing [place] at [facility] to provide your oncology and hematology care.  To afford each patient quality time with our provider, please arrive at least 15 minutes before your scheduled appointment time.   If you have a lab appointment with the [place] please come in thru the  Main Entrance and check in at the main information desk  You need to re-schedule your appointment should you arrive 10 or more minutes late.  We strive to give you quality time with our providers, and arriving late affects you and other patients whose appointments are after yours.  Also, if you no show three or more times for appointments you may be dismissed from the clinic at the providers discretion.     Again, thank you for choosing [facility].  Our hope is that these requests will decrease the amount of time that you wait before being seen by our physicians.       _____________________________________________________________  Should you have questions after your visit to [facility], please contact our office at [phone] between the hours of 8:00 a.m. and 4:30 p.m.  Voicemails left after 4:00 p.m. will not be returned until the following business day.  For prescription refill requests, have your pharmacy contact our office and allow 72 hours.    Cancer Center Support Programs:   > Cancer Support Group  2nd Tuesday of the month 1pm-2pm, Journey Room

## 2019-01-10 NOTE — Assessment & Plan Note (Signed)
1.  Kappa light chain myeloma, high risk, stage III: -Presentation with acute renal failure and hypercalcemia.  M spike was 0.4 g.  Immunofixation did not show any immunoglobulin heavy chain.  Kappa free light chains was 12,085.  Free light chain ratio was 1981 and LDH of 224. -Bone marrow biopsy on 06/16/2018 shows 87% atypical plasma cells.  Cytogenetics shows complex chromosome abnormalities.  Gain of 1 q. was seen.  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 - Myeloma labs from 11/01/2018 shows no M spike.  Free light chain ratio improved to 95.5.  Kappa light chains improved to 1347.   - MRI of the brain for left-sided facial numbness was negative. - He was evaluated by Dr. Aris Lot at [facility] for transplant on 12/07/2018. - He is continuing to tolerate treatments very well. -Myeloma panel on 01/03/2019 shows kappa light chains of 140 and ratio of 4.7.  This was previously 270 and 10.37 respectively on 12/08/2018. - We have held his cyclophosphamide after 6 cycles, to minimize difficulty collecting stem cells. -We will reach out to Dr. Aris Lot for stem cell collection.  Patient is getting all affairs in order to proceed with the transplant. - We will see him back in 2 weeks for follow-up.  He will proceed with weekly Velcade.  He is taking dexamethasone 20 mg weekly.  2.  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 -He will continue Retacrit 20,000 units weekly.  Hemoglobin improved to 11.2.  3.  Hyperkalemia: - He will continue Veltassa.  Potassium is normal.  4.  ID prophylaxis: - He had skin rash with acyclovir.  He will continue Valtrex 500 mg twice daily.  5.  Low back pain: - He will continue Dilaudid 4 mg every 8 hours which is controlling his pain.  6.  Lower extremity edema: -He will continue torsemide 20 mg daily.

## 2019-01-10 NOTE — Progress Notes (Signed)
[name], [place] [address]   CLINIC:  Medical Oncology/Hematology  PCP:  Rosita Fire, MD [place] [id number] [phone]   REASON FOR VISIT: Follow-up for Multiple myeloma    BRIEF ONCOLOGIC HISTORY:  Oncology History  Kappa light chain myeloma ([place])  06/16/2018 Initial Diagnosis   Kappa light chain myeloma ([place])   06/17/2018 -  Chemotherapy   The patient had palonosetron (ALOXI) injection 0.25 mg, 0.25 mg, Intravenous,  Once, 27 of 27 cycles Administration: 0.25 mg (06/17/2018), 0.25 mg (06/23/2018), 0.25 mg (06/30/2018), 0.25 mg (07/07/2018), 0.25 mg (07/14/2018), 0.25 mg (07/22/2018), 0.25 mg (07/29/2018), 0.25 mg (08/06/2018), 0.25 mg (08/13/2018), 0.25 mg (08/20/2018), 0.25 mg (08/27/2018), 0.25 mg (09/06/2018), 0.25 mg (09/13/2018), 0.25 mg (09/20/2018), 0.25 mg (09/27/2018), 0.25 mg (10/04/2018), 0.25 mg (10/11/2018), 0.25 mg (10/18/2018), 0.25 mg (10/25/2018), 0.25 mg (11/01/2018), 0.25 mg (11/08/2018), 0.25 mg (11/15/2018), 0.25 mg (11/22/2018), 0.25 mg (11/29/2018), 0.25 mg (12/08/2018), 0.25 mg (12/16/2018) bortezomib SQ (VELCADE) chemo injection 2.5 mg, 1.3 mg/m2 = 2.5 mg, Subcutaneous,  Once, 29 of 33 cycles Administration: 2.5 mg (06/17/2018), 2.5 mg (06/23/2018), 2.5 mg (06/30/2018), 2.5 mg (07/07/2018), 2.5 mg (07/14/2018), 2.5 mg (07/22/2018), 2.5 mg (07/29/2018), 2.5 mg (08/06/2018), 2.5 mg (08/13/2018), 2.5 mg (08/20/2018), 2.5 mg (08/27/2018), 2.5 mg (09/06/2018), 2.5 mg (09/13/2018), 2.5 mg (09/20/2018), 2.5 mg (09/27/2018), 2.5 mg (10/04/2018), 2.5 mg (10/11/2018), 2.5 mg (10/18/2018), 2.5 mg (10/25/2018), 2.5 mg (11/01/2018), 2.5 mg (11/08/2018), 2.5 mg (11/15/2018), 2.5 mg (11/22/2018), 2.5 mg (11/29/2018), 2.5 mg (12/08/2018), 2.5 mg (12/16/2018), 2.5 mg (12/27/2018), 2.5 mg (01/03/2019) cyclophosphamide (CYTOXAN) 300 mg in sodium chloride 0.9 % 250 mL chemo infusion, 300 mg (100 % of original dose 300 mg), Intravenous,  Once, 26 of 26 cycles Dose modification:  300 mg (original dose 300 mg, Cycle 1, Reason: Change in SCr/CrCl), 500 mg (original dose 500 mg, Cycle 2, Reason: Provider Judgment), 500 mg (original dose 500 mg, Cycle 3, Reason: Change in SCr/CrCl) Administration: 300 mg (06/17/2018), 500 mg (06/23/2018), 500 mg (06/30/2018), 600 mg (07/07/2018), 600 mg (07/14/2018), 600 mg (07/22/2018), 600 mg (07/29/2018), 600 mg (08/06/2018), 600 mg (08/13/2018), 600 mg (08/20/2018), 600 mg (08/27/2018), 600 mg (09/06/2018), 600 mg (09/13/2018), 600 mg (09/20/2018), 600 mg (09/27/2018), 600 mg (10/04/2018), 600 mg (10/11/2018), 600 mg (10/18/2018), 600 mg (10/25/2018), 600 mg (11/01/2018), 600 mg (11/08/2018), 600 mg (11/15/2018), 600 mg (11/22/2018), 600 mg (11/29/2018), 600 mg (12/08/2018), 600 mg (12/16/2018)  for chemotherapy treatment.       INTERVAL HISTORY:  [name] 58 y.o. male seen for follow-up of multiple myeloma.  Numbness in the bottom of the feet has been stable.  No new numbness reported.  Occasional nausea is present but denied any vomiting.  Appetite and energy levels are 50%.  Denies any fevers, night sweats or weight loss in the last 6 months.  He is continuing to work on moving to [place] bone marrow transplantation as soon as possible.   REVIEW OF SYSTEMS:  Review of Systems  Neurological: Positive for numbness.  All other systems reviewed and are negative.    PAST MEDICAL/SURGICAL HISTORY:  Past Medical History:  Diagnosis Date  . Allergy   . Arthritis    neck and back  . Blood transfusion without reported diagnosis   . BPH (benign prostatic hyperplasia)   . Cancer [facility]) 2004   testicle  . Chronic kidney disease    kidney stones  . Left lumbar radiculopathy 06/12/2016  . 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  Medical history non-contributory    Pt has scattered thoughts and uncertain of past medical history  . 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 Stones, urinary tract   . Substance abuse ([place])    prescribed oxydcodone- 10 years   Past Surgical  History:  Procedure Laterality Date  . BACK SURGERY     x5  . CERVICAL DISC SURGERY     x2  . COLONOSCOPY WITH PROPOFOL N/A 08/11/2016   Dr. Gala Romney: non-bleeding internal hemorrhoids, one 4 mm hyperplastic rectal polyp, diverticulosis in entire examined colon  . POLYPECTOMY  08/11/2016   Procedure: POLYPECTOMY;  Surgeon: [name], MD;  Location: [facility] ENDO SUITE;  Service: Endoscopy;;  colon  . PORTACATH PLACEMENT Left 09/20/2018   Procedure: INSERTION PORT-A-CATH (catheter attached left subclavian);  Surgeon: Virl Cagey, MD;  Location: [facility] ORS;  Service: General;  Laterality: Left;  . testicular cancer  2004  . TONSILLECTOMY       SOCIAL HISTORY:  Social History   Socioeconomic History  . Marital status: Single    Spouse name: Not on file  . Number of children: 1  . Years of education: 83  . Highest education level: Not on file  Occupational History  . Occupation: retired    Comment: Psychologist, prison and probation services  . Occupation: disabled  Social Needs  . Financial resource strain: Not on file  . Food insecurity    Worry: Not on file    Inability: Not on file  . Transportation needs    Medical: Not on file    Non-medical: Not on file  Tobacco Use  . Smoking status: Current Every Day Smoker    Packs/day: 1.00    Years: 40.00    Pack years: 40.00    Types: Cigarettes    Start date: 03/31/1974  . Smokeless tobacco: Never Used  Substance and Sexual Activity  . Alcohol use: Yes    Comment: Occasional  . Drug use: No    Types: Cocaine    Comment: Remote hx of cocaine, quit 1989  . Sexual activity: Yes  Lifestyle  . Physical activity    Days per week: Not on file    Minutes per session: Not on file  . Stress: Not on file  Relationships  . Social Herbalist on phone: Not on file    Gets together: Not on file    Attends religious service: Not on file    Active member of club or organization: Not on file    Attends meetings of clubs or organizations: Not on file     Relationship status: Not on file  . Intimate partner violence    Fear of current or ex partner: No    Emotionally abused: No    Physically abused: No    Forced sexual activity: No  Other Topics Concern  . Not on file  Social History Narrative   [employer] for 12 years   [employer] for 45 years      Never married   One daughter      Lives alone   Retail banker   Right-handed   Occasional caffeine use          FAMILY HISTORY:  Family History  Problem Relation Age of Onset  . COPD Mother   . Heart disease Mother   . Other Father        Never knew his father.  . Thyroid disease Sister   . Arthritis Sister   .  Heart disease Sister        bypass  . Colon cancer Neg Hx     CURRENT MEDICATIONS:  Outpatient Encounter Medications as of 01/10/2019  Medication Sig  . bortezomib IV (VELCADE) 3.5 MG injection Inject 1.3 mg/m2 into the vein once a week.   . calcitRIOL (ROCALTROL) 0.5 MCG capsule Take 0.5 mcg by mouth daily.   . CYCLOPHOSPHAMIDE IV Inject into the vein once a week.  Marland Kitchen Epoetin Alfa (PROCRIT IJ) Inject as directed. Unsure of dosage- gets this once weekly  . HYDROmorphone (DILAUDID) 4 MG tablet Take 1 tablet (4 mg total) by mouth every 8 (eight) hours as needed for severe pain.  . Patiromer Sorbitex Calcium (VELTASSA PO) Take by mouth 2 (two) times a week.   . senna (SENOKOT) 8.6 MG tablet Take 1 tablet by mouth 2 (two) times daily.   . sodium bicarbonate 650 MG tablet Take 1,300 mg by mouth 2 (two) times daily.  Marland Kitchen torsemide (DEMADEX) 20 MG tablet TAKING 1 TABLET DAILY, AND 2 TABLETS ONLY IF HIS ANKLES ARE REALLY SWOLLEN  . valACYclovir (VALTREX) 500 MG tablet Take 1 tablet (500 mg total) by mouth 2 (two) times daily.  . Misc. Devices MISC Please provide patient with rollaider.  Dx: multiple myeloma C90.0 (Patient not taking: Reported on 01/10/2019)  . [DISCONTINUED] diphenhydrAMINE (BENADRYL) 25 MG tablet Take 1 tablet (25 mg total) by mouth as  needed for itching. (Patient not taking: Reported on 01/10/2019)   No facility-administered encounter medications on file as of 01/10/2019.     ALLERGIES:  Allergies  Allergen Reactions  . Oxycodone-Acetaminophen   . Acetaminophen Nausea Only and Other (See Comments)    Elevated liver enzymes  . Cymbalta [Duloxetine Hcl] Swelling    Facial swelling  . Diclofenac Sodium Swelling       . Diclofenac Sodium Swelling  . Imodium [Loperamide] Swelling    facial  . Lyrica [Pregabalin] Swelling  . Morphine Other (See Comments)    Bradycardia   . Vioxx [Rofecoxib] Swelling  . Aleve [Naproxen] Swelling    eye     PHYSICAL EXAM:  ECOG Performance status: 1  Vitals:   01/10/19 1508  BP: (!) 152/60  Pulse: 86  Resp: 20  Temp: (!) 97.3 F (36.3 C)  SpO2: 94%   Filed Weights   01/10/19 1508  Weight: 172 lb 11.2 oz (78.3 kg)    Physical Exam Vitals signs reviewed.  Constitutional:      Appearance: Normal appearance. He is normal weight.  Cardiovascular:     Rate and Rhythm: Normal rate and regular rhythm.     Heart sounds: Normal heart sounds.  Pulmonary:     Effort: Pulmonary effort is normal.     Breath sounds: Normal breath sounds.  Abdominal:     General: Bowel sounds are normal.     Palpations: Abdomen is soft.  Musculoskeletal: Normal range of motion.     Right lower leg: No edema.     Left lower leg: No edema.  Skin:    General: Skin is warm and dry.  Neurological:     Mental Status: He is alert and oriented to person, place, and time. Mental status is at baseline.  Psychiatric:        Mood and Affect: Mood normal.        Behavior: Behavior normal.      LABORATORY DATA:  I have reviewed the labs as listed.  CBC    Component Value Date/Time  WBC 5.3 01/10/2019 1439   RBC 3.35 (L) 01/10/2019 1439   HGB 11.2 (L) 01/10/2019 1439   HCT 35.9 (L) 01/10/2019 1439   PLT 194 01/10/2019 1439   MCV 107.2 (H) 01/10/2019 1439   MCH 33.4 01/10/2019 1439    MCHC 31.2 01/10/2019 1439   RDW 18.7 (H) 01/10/2019 1439   LYMPHSABS 1.0 01/10/2019 1439   MONOABS 0.7 01/10/2019 1439   EOSABS 0.2 01/10/2019 1439   BASOSABS 0.0 01/10/2019 1439   CMP Latest Ref Rng & Units 01/10/2019 01/03/2019 12/27/2018  Glucose 70 - 99 mg/dL 83 91 90  BUN 6 - 20 mg/dL 41(H) 45(H) 41(H)  Creatinine 0.61 - 1.24 mg/dL 4.35(H) 4.34(H) 4.23(H)  Sodium 135 - 145 mmol/L 140 139 140  Potassium 3.5 - 5.1 mmol/L 4.7 4.7 4.7  Chloride 98 - 111 mmol/L 109 109 109  CO2 22 - 32 mmol/L 23 21(L) 22  Calcium 8.9 - 10.3 mg/dL 8.0(L) 8.1(L) 7.9(L)  Total Protein 6.5 - 8.1 g/dL 6.8 6.3(L) 6.4(L)  Total Bilirubin 0.3 - 1.2 mg/dL 0.6 0.5 0.5  Alkaline Phos 38 - 126 U/L 77 72 81  AST 15 - 41 U/L 15 12(L) 14(L)  ALT 0 - 44 U/L _0 I have personally reviewed his scans and discussed with him.     ASSESSMENT & PLAN:   Kappa light chain myeloma ([place]) 1.  Kappa light chain myeloma, high risk, stage III: -Presentation with acute renal failure and hypercalcemia.  M spike was 0.4 g.  Immunofixation did not show any immunoglobulin heavy chain.  Kappa free light chains was 12,085.  Free light chain ratio was 1981 and LDH of 224. -Bone marrow biopsy on 06/16/2018 shows 87% atypical plasma cells.  Cytogenetics shows complex chromosome abnormalities.  Gain of 1 q. was seen.  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 - Myeloma labs from 11/01/2018 shows no M spike.  Free light chain ratio improved to 95.5.  Kappa light chains improved to 1347.   - MRI of the brain for left-sided facial numbness was negative. - He was evaluated by Dr. Aris Lot at [facility] for transplant on 12/07/2018. - He is continuing to tolerate treatments very well. -Myeloma panel on 01/03/2019 shows kappa light chains of 140 and ratio of 4.7.  This was previously 270 and 10.37 respectively on 12/08/2018. - We have held his cyclophosphamide after 6 cycles, to  minimize difficulty collecting stem cells. -We will reach out to Dr. Aris Lot for stem cell collection.  Patient is getting all affairs in order to proceed with the transplant. - We will see him back in 2 weeks for follow-up.  He will proceed with weekly Velcade.  He is taking dexamethasone 20 mg weekly.  2.  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 -He will continue Retacrit 20,000 units weekly.  Hemoglobin improved to 11.2.  3.  Hyperkalemia: - He will continue Veltassa.  Potassium is normal.  4.  ID prophylaxis: - He had skin rash with acyclovir.  He will continue Valtrex 500 mg twice daily.  5.  Low back pain: - He will continue Dilaudid 4 mg every 8 hours which is controlling his pain.  6.  Lower extremity edema: -He will continue torsemide 20 mg daily.  Total time spent is 25 minutes with more than 50% of the time spent face-to-face discussing treatment plan, counseling and coordination of care.  Orders placed this encounter:  No orders of the defined types were placed in this encounter.     Brandon Jack, MD [place] [phone]

## 2019-01-17 ENCOUNTER — Inpatient Hospital Stay (HOSPITAL_COMMUNITY): Payer: Medicare Other

## 2019-01-17 ENCOUNTER — Encounter (HOSPITAL_COMMUNITY): Payer: Self-pay

## 2019-01-17 ENCOUNTER — Other Ambulatory Visit: Payer: Self-pay

## 2019-01-17 VITALS — BP 150/80 | HR 66 | Temp 97.8°F | Resp 18 | Wt 174.8 lb

## 2019-01-17 DIAGNOSIS — N185 Chronic kidney disease, stage 5: Secondary | ICD-10-CM

## 2019-01-17 DIAGNOSIS — C9000 Multiple myeloma not having achieved remission: Secondary | ICD-10-CM

## 2019-01-17 DIAGNOSIS — Z5112 Encounter for antineoplastic immunotherapy: Secondary | ICD-10-CM | POA: Diagnosis not present

## 2019-01-17 LAB — MAGNESIUM: Magnesium: 2 mg/dL (ref 1.7–2.4)

## 2019-01-17 LAB — CBC WITH DIFFERENTIAL/PLATELET
Abs Immature Granulocytes: 0.01 10*3/uL (ref 0.00–0.07)
Basophils Absolute: 0 10*3/uL (ref 0.0–0.1)
Basophils Relative: 0 %
Eosinophils Absolute: 0.2 10*3/uL (ref 0.0–0.5)
Eosinophils Relative: 4 %
HCT: 34.8 % — ABNORMAL LOW (ref 39.0–52.0)
Hemoglobin: 10.9 g/dL — ABNORMAL LOW (ref 13.0–17.0)
Immature Granulocytes: 0 %
Lymphocytes Relative: 17 %
Lymphs Abs: 0.9 10*3/uL (ref 0.7–4.0)
MCH: 34 pg (ref 26.0–34.0)
MCHC: 31.3 g/dL (ref 30.0–36.0)
MCV: 108.4 fL — ABNORMAL HIGH (ref 80.0–100.0)
Monocytes Absolute: 0.6 10*3/uL (ref 0.1–1.0)
Monocytes Relative: 11 %
Neutro Abs: 3.8 10*3/uL (ref 1.7–7.7)
Neutrophils Relative %: 68 %
Platelets: 195 10*3/uL (ref 150–400)
RBC: 3.21 MIL/uL — ABNORMAL LOW (ref 4.22–5.81)
RDW: 17.4 % — ABNORMAL HIGH (ref 11.5–15.5)
WBC: 5.5 10*3/uL (ref 4.0–10.5)
nRBC: 0 % (ref 0.0–0.2)

## 2019-01-17 LAB — LACTATE DEHYDROGENASE: LDH: 205 U/L — ABNORMAL HIGH (ref 98–192)

## 2019-01-17 LAB — COMPREHENSIVE METABOLIC PANEL
ALT: 10 U/L (ref 0–44)
AST: 15 U/L (ref 15–41)
Albumin: 4 g/dL (ref 3.5–5.0)
Alkaline Phosphatase: 68 U/L (ref 38–126)
Anion gap: 9 (ref 5–15)
BUN: 41 mg/dL — ABNORMAL HIGH (ref 6–20)
CO2: 23 mmol/L (ref 22–32)
Calcium: 8.8 mg/dL — ABNORMAL LOW (ref 8.9–10.3)
Chloride: 107 mmol/L (ref 98–111)
Creatinine, Ser: 3.99 mg/dL — ABNORMAL HIGH (ref 0.61–1.24)
GFR calc Af Amer: 18 mL/min — ABNORMAL LOW (ref >60)
GFR calc non Af Amer: 15 mL/min — ABNORMAL LOW (ref >60)
Glucose, Bld: 107 mg/dL — ABNORMAL HIGH (ref 70–99)
Potassium: 5.2 mmol/L — ABNORMAL HIGH (ref 3.5–5.1)
Sodium: 139 mmol/L (ref 135–145)
Total Bilirubin: 0.8 mg/dL (ref 0.3–1.2)
Total Protein: 6.6 g/dL (ref 6.5–8.1)

## 2019-01-17 LAB — PHOSPHORUS: Phosphorus: 4.4 mg/dL (ref 2.5–4.6)

## 2019-01-17 MED ORDER — BORTEZOMIB CHEMO SQ INJECTION 3.5 MG (2.5MG/ML)
1.3000 mg/m2 | Freq: Once | INTRAMUSCULAR | Status: AC
  Administered 2019-01-17: 2.5 mg via SUBCUTANEOUS
  Filled 2019-01-17: qty 1

## 2019-01-17 MED ORDER — ONDANSETRON HCL 4 MG PO TABS
8.0000 mg | ORAL_TABLET | Freq: Once | ORAL | Status: AC
  Administered 2019-01-17: 8 mg via ORAL
  Filled 2019-01-17: qty 2

## 2019-01-17 MED ORDER — EPOETIN ALFA-EPBX 10000 UNIT/ML IJ SOLN
20000.0000 [IU] | Freq: Once | INTRAMUSCULAR | Status: AC
  Administered 2019-01-17: 20000 [IU] via SUBCUTANEOUS
  Filled 2019-01-17: qty 2

## 2019-01-17 MED ORDER — DEXAMETHASONE 4 MG PO TABS
20.0000 mg | ORAL_TABLET | Freq: Once | ORAL | Status: AC
  Administered 2019-01-17: 20 mg via ORAL
  Filled 2019-01-17: qty 5

## 2019-01-17 NOTE — Patient Instructions (Signed)
[facility] Discharge Instructions for Patients Receiving Chemotherapy   Beginning January 23rd 2017 lab work for the [facility] will be done in the  Main lab at [facility] on 1st floor. If you have a lab appointment with the [facility] please come in thru the  Main Entrance and check in at the main information desk   Today you received the following chemotherapy agents Velcade injection as well as Retacrit injection. Follow-up as scheduled. Call clinic for any questions or concerns  To help prevent nausea and vomiting after your treatment, we encourage you to take your nausea medication   If you develop nausea and vomiting, or diarrhea that is not controlled by your medication, call the clinic.  The clinic phone number is [phone]. Office hours are Monday-Friday 8:30am-5:00pm.  BELOW ARE SYMPTOMS THAT SHOULD BE REPORTED IMMEDIATELY:  *FEVER GREATER THAN 101.0 F  *CHILLS WITH OR WITHOUT FEVER  NAUSEA AND VOMITING THAT IS NOT CONTROLLED WITH YOUR NAUSEA MEDICATION  *UNUSUAL SHORTNESS OF BREATH  *UNUSUAL BRUISING OR BLEEDING  TENDERNESS IN MOUTH AND THROAT WITH OR WITHOUT PRESENCE OF ULCERS  *URINARY PROBLEMS  *BOWEL PROBLEMS  UNUSUAL RASH Items with * indicate a potential emergency and should be followed up as soon as possible. If you have an emergency after office hours please contact your primary care physician or go to the nearest emergency department.  Please call the clinic during office hours if you have any questions or concerns.   You may also contact the Patient Navigator at [phone] should you have any questions or need assistance in obtaining follow up care.      Resources For Cancer Patients and their Caregivers ? American Cancer Society: Can assist with transportation, wigs, general needs, runs Look Good Feel Better.        414-064-1216 ? Cancer Care: Provides financial assistance, online support groups,  medication/co-pay assistance.  1-800-813-HOPE 940 641 9567) ? [facility] Assists [place] cancer patients and their families through emotional , educational and financial support.  (636)655-1047 ? [place] DSS Where to apply for food stamps, Medicaid and utility assistance. 647 632 3843 ? [facility]ATS: Transportation to medical appointments. (845) 566-2300 ? Social Security Administration: May apply for disability if have a Stage IV cancer. 361-276-8218 914-328-9866 ? [employer], Disability and Transit Services: Assists with nutrition, care and transit needs. 364-165-2882

## 2019-01-17 NOTE — Progress Notes (Signed)
[facility] reviewed with RNester NP and pt approved for Velcade and Retacrit injections today per NP                          [name] tolerated Velcade and Retacrit injections well without complaints or incident.Hgb 10.9 today VSS Pt discharged self ambulatory in satisfactory condition

## 2019-01-18 LAB — KAPPA/LAMBDA LIGHT CHAINS
Kappa free light chain: 95.7 mg/L — ABNORMAL HIGH (ref 3.3–19.4)
Kappa, lambda light chain ratio: 3.04 — ABNORMAL HIGH (ref 0.26–1.65)
Lambda free light chains: 31.5 mg/L — ABNORMAL HIGH (ref 5.7–26.3)

## 2019-01-19 LAB — MULTIPLE MYELOMA PANEL, SERUM
Albumin SerPl Elph-Mcnc: 4 g/dL (ref 2.9–4.4)
Albumin/Glob SerPl: 1.7 (ref 0.7–1.7)
Alpha 1: 0.2 g/dL (ref 0.0–0.4)
Alpha2 Glob SerPl Elph-Mcnc: 0.8 g/dL (ref 0.4–1.0)
B-Globulin SerPl Elph-Mcnc: 0.7 g/dL (ref 0.7–1.3)
Gamma Glob SerPl Elph-Mcnc: 0.9 g/dL (ref 0.4–1.8)
Globulin, Total: 2.5 g/dL (ref 2.2–3.9)
IgA: 42 mg/dL — ABNORMAL LOW (ref 90–386)
IgG (Immunoglobin G), Serum: 854 mg/dL (ref 603–1613)
IgM (Immunoglobulin M), Srm: 31 mg/dL (ref 20–172)
Total Protein ELP: 6.5 g/dL (ref 6.0–8.5)

## 2019-01-24 ENCOUNTER — Other Ambulatory Visit: Payer: Self-pay

## 2019-01-24 ENCOUNTER — Inpatient Hospital Stay (HOSPITAL_COMMUNITY): Payer: Medicare Other

## 2019-01-24 ENCOUNTER — Encounter (HOSPITAL_COMMUNITY): Payer: Self-pay | Admitting: Hematology

## 2019-01-24 ENCOUNTER — Inpatient Hospital Stay (HOSPITAL_BASED_OUTPATIENT_CLINIC_OR_DEPARTMENT_OTHER): Payer: Medicare Other | Admitting: Hematology

## 2019-01-24 DIAGNOSIS — C9000 Multiple myeloma not having achieved remission: Secondary | ICD-10-CM

## 2019-01-24 DIAGNOSIS — N185 Chronic kidney disease, stage 5: Secondary | ICD-10-CM

## 2019-01-24 DIAGNOSIS — Z5112 Encounter for antineoplastic immunotherapy: Secondary | ICD-10-CM | POA: Diagnosis not present

## 2019-01-24 LAB — CBC WITH DIFFERENTIAL/PLATELET
Abs Immature Granulocytes: 0.02 10*3/uL (ref 0.00–0.07)
Basophils Absolute: 0 10*3/uL (ref 0.0–0.1)
Basophils Relative: 1 %
Eosinophils Absolute: 0.4 10*3/uL (ref 0.0–0.5)
Eosinophils Relative: 7 %
HCT: 36.4 % — ABNORMAL LOW (ref 39.0–52.0)
Hemoglobin: 11.5 g/dL — ABNORMAL LOW (ref 13.0–17.0)
Immature Granulocytes: 0 %
Lymphocytes Relative: 16 %
Lymphs Abs: 1 10*3/uL (ref 0.7–4.0)
MCH: 34.5 pg — ABNORMAL HIGH (ref 26.0–34.0)
MCHC: 31.6 g/dL (ref 30.0–36.0)
MCV: 109.3 fL — ABNORMAL HIGH (ref 80.0–100.0)
Monocytes Absolute: 0.9 10*3/uL (ref 0.1–1.0)
Monocytes Relative: 14 %
Neutro Abs: 3.9 10*3/uL (ref 1.7–7.7)
Neutrophils Relative %: 62 %
Platelets: 200 10*3/uL (ref 150–400)
RBC: 3.33 MIL/uL — ABNORMAL LOW (ref 4.22–5.81)
RDW: 17.5 % — ABNORMAL HIGH (ref 11.5–15.5)
WBC: 6.2 10*3/uL (ref 4.0–10.5)
nRBC: 0 % (ref 0.0–0.2)

## 2019-01-24 LAB — COMPREHENSIVE METABOLIC PANEL
ALT: 10 U/L (ref 0–44)
AST: 15 U/L (ref 15–41)
Albumin: 4.2 g/dL (ref 3.5–5.0)
Alkaline Phosphatase: 74 U/L (ref 38–126)
Anion gap: 11 (ref 5–15)
BUN: 41 mg/dL — ABNORMAL HIGH (ref 6–20)
CO2: 23 mmol/L (ref 22–32)
Calcium: 8.9 mg/dL (ref 8.9–10.3)
Chloride: 106 mmol/L (ref 98–111)
Creatinine, Ser: 4.07 mg/dL — ABNORMAL HIGH (ref 0.61–1.24)
GFR calc Af Amer: 17 mL/min — ABNORMAL LOW (ref >60)
GFR calc non Af Amer: 15 mL/min — ABNORMAL LOW (ref >60)
Glucose, Bld: 87 mg/dL (ref 70–99)
Potassium: 4.7 mmol/L (ref 3.5–5.1)
Sodium: 140 mmol/L (ref 135–145)
Total Bilirubin: 0.7 mg/dL (ref 0.3–1.2)
Total Protein: 7 g/dL (ref 6.5–8.1)

## 2019-01-24 MED ORDER — DEXAMETHASONE 4 MG PO TABS
20.0000 mg | ORAL_TABLET | Freq: Once | ORAL | Status: AC
  Administered 2019-01-24: 12:00:00 20 mg via ORAL
  Filled 2019-01-24: qty 5

## 2019-01-24 MED ORDER — ONDANSETRON HCL 4 MG PO TABS
8.0000 mg | ORAL_TABLET | Freq: Once | ORAL | Status: AC
  Administered 2019-01-24: 8 mg via ORAL
  Filled 2019-01-24: qty 2

## 2019-01-24 MED ORDER — HYDROMORPHONE HCL 4 MG PO TABS: 4.0000 mg | ORAL_TABLET | Freq: Three times a day (TID) | ORAL | 0 refills | Status: DC | PRN

## 2019-01-24 MED ORDER — BORTEZOMIB CHEMO SQ INJECTION 3.5 MG (2.5MG/ML)
1.3000 mg/m2 | Freq: Once | INTRAMUSCULAR | Status: AC
  Administered 2019-01-24: 2.5 mg via SUBCUTANEOUS
  Filled 2019-01-24: qty 1

## 2019-01-24 NOTE — Patient Instructions (Signed)
[facility] Discharge Instructions  You were seen today by Dr. Delton Coombes. He went over your recent lab results. Continue weekly labs and Velcade. He will see you back in 3 weeks for labs and follow up.   Thank you for choosing [facility] to provide your oncology and hematology care.  To afford each patient quality time with our provider, please arrive at least 15 minutes before your scheduled appointment time.   If you have a lab appointment with the [facility] please come in thru the  Main Entrance and check in at the main information desk  You need to re-schedule your appointment should you arrive 10 or more minutes late.  We strive to give you quality time with our providers, and arriving late affects you and other patients whose appointments are after yours.  Also, if you no show three or more times for appointments you may be dismissed from the clinic at the providers discretion.     Again, thank you for choosing [facility].  Our hope is that these requests will decrease the amount of time that you wait before being seen by our physicians.       _____________________________________________________________  Should you have questions after your visit to [facility] please contact our office at [phone] between the hours of 8:00 a.m. and 4:30 p.m.  Voicemails left after 4:00 p.m. will not be returned until the following business day.  For prescription refill requests, have your pharmacy contact our office and allow 72 hours.    Cancer Center Support Programs:   > Cancer Support Group  2nd Tuesday of the month 1pm-2pm, Journey Room

## 2019-01-24 NOTE — Patient Instructions (Signed)
[facility] Discharge Instructions for Patients Receiving Chemotherapy   Beginning January 23rd 2017 lab work for the Cancer Center will be done in the  Main lab at  on 1st floor. If you have a lab appointment with the Cancer Center please come in thru the  Main Entrance and check in at the main information desk   Today you received the following chemotherapy agents Velcade injection. Follow-up as scheduled. Call clinic for any questions or concerns  To help prevent nausea and vomiting after your treatment, we encourage you to take your nausea medication   If you develop nausea and vomiting, or diarrhea that is not controlled by your medication, call the clinic.  The clinic phone number is [phone]. Office hours are Monday-Friday 8:30am-5:00pm.  BELOW ARE SYMPTOMS THAT SHOULD BE REPORTED IMMEDIATELY:  *FEVER GREATER THAN 101.0 F  *CHILLS WITH OR WITHOUT FEVER  NAUSEA AND VOMITING THAT IS NOT CONTROLLED WITH YOUR NAUSEA MEDICATION  *UNUSUAL SHORTNESS OF BREATH  *UNUSUAL BRUISING OR BLEEDING  TENDERNESS IN MOUTH AND THROAT WITH OR WITHOUT PRESENCE OF ULCERS  *URINARY PROBLEMS  *BOWEL PROBLEMS  UNUSUAL RASH Items with * indicate a potential emergency and should be followed up as soon as possible. If you have an emergency after office hours please contact your primary care physician or go to the nearest emergency department.  Please call the clinic during office hours if you have any questions or concerns.   You may also contact the Patient Navigator at [phone] should you have any questions or need assistance in obtaining follow up care.      Resources For Cancer Patients and their Caregivers ? American Cancer Society: Can assist with transportation, wigs, general needs, runs Look Good Feel Better.        1-888-227-6333 ? Cancer Care: Provides financial assistance, online support groups, medication/co-pay assistance.   1-800-813-HOPE (4673) ? [facility] Assists [place] cancer patients and their families through emotional , educational and financial support.  336-427-4357 ? [place] DSS Where to apply for food stamps, Medicaid and utility assistance. 336-342-1394 ? RCATS: Transportation to medical appointments. 336-347-2287 ? Social Security Administration: May apply for disability if have a Stage IV cancer. 336-342-7796 1-800-772-1213 ? [place] [facility], Disability and Transit Services: Assists with nutrition, care and transit needs. 336-349-2343         

## 2019-01-24 NOTE — Progress Notes (Signed)
[place], [place] [address]   CLINIC:  Medical Oncology/Hematology  PCP:  Rosita Fire, MD [place] [id number] [phone]   REASON FOR VISIT: Follow-up for Multiple myeloma    BRIEF ONCOLOGIC HISTORY:  Oncology History  Kappa light chain myeloma ([place])  06/16/2018 Initial Diagnosis   Kappa light chain myeloma ([place])   06/17/2018 -  Chemotherapy   The patient had palonosetron (ALOXI) injection 0.25 mg, 0.25 mg, Intravenous,  Once, 27 of 27 cycles Administration: 0.25 mg (06/17/2018), 0.25 mg (06/23/2018), 0.25 mg (06/30/2018), 0.25 mg (07/07/2018), 0.25 mg (07/14/2018), 0.25 mg (07/22/2018), 0.25 mg (07/29/2018), 0.25 mg (08/06/2018), 0.25 mg (08/13/2018), 0.25 mg (08/20/2018), 0.25 mg (08/27/2018), 0.25 mg (09/06/2018), 0.25 mg (09/13/2018), 0.25 mg (09/20/2018), 0.25 mg (09/27/2018), 0.25 mg (10/04/2018), 0.25 mg (10/11/2018), 0.25 mg (10/18/2018), 0.25 mg (10/25/2018), 0.25 mg (11/01/2018), 0.25 mg (11/08/2018), 0.25 mg (11/15/2018), 0.25 mg (11/22/2018), 0.25 mg (11/29/2018), 0.25 mg (12/08/2018), 0.25 mg (12/16/2018) bortezomib SQ (VELCADE) chemo injection 2.5 mg, 1.3 mg/m2 = 2.5 mg, Subcutaneous,  Once, 30 of 33 cycles Administration: 2.5 mg (06/17/2018), 2.5 mg (06/23/2018), 2.5 mg (06/30/2018), 2.5 mg (07/07/2018), 2.5 mg (07/14/2018), 2.5 mg (07/22/2018), 2.5 mg (07/29/2018), 2.5 mg (08/06/2018), 2.5 mg (08/13/2018), 2.5 mg (08/20/2018), 2.5 mg (08/27/2018), 2.5 mg (09/06/2018), 2.5 mg (09/13/2018), 2.5 mg (09/20/2018), 2.5 mg (09/27/2018), 2.5 mg (10/04/2018), 2.5 mg (10/11/2018), 2.5 mg (10/18/2018), 2.5 mg (10/25/2018), 2.5 mg (11/01/2018), 2.5 mg (11/08/2018), 2.5 mg (11/15/2018), 2.5 mg (11/22/2018), 2.5 mg (11/29/2018), 2.5 mg (12/08/2018), 2.5 mg (12/16/2018), 2.5 mg (12/27/2018), 2.5 mg (01/03/2019), 2.5 mg (01/10/2019), 2.5 mg (01/17/2019) cyclophosphamide (CYTOXAN) 300 mg in sodium chloride 0.9 % 250 mL chemo infusion, 300 mg (100 % of original dose 300 mg), Intravenous,   Once, 26 of 26 cycles Dose modification: 300 mg (original dose 300 mg, Cycle 1, Reason: Change in SCr/CrCl), 500 mg (original dose 500 mg, Cycle 2, Reason: Provider Judgment), 500 mg (original dose 500 mg, Cycle 3, Reason: Change in SCr/CrCl) Administration: 300 mg (06/17/2018), 500 mg (06/23/2018), 500 mg (06/30/2018), 600 mg (07/07/2018), 600 mg (07/14/2018), 600 mg (07/22/2018), 600 mg (07/29/2018), 600 mg (08/06/2018), 600 mg (08/13/2018), 600 mg (08/20/2018), 600 mg (08/27/2018), 600 mg (09/06/2018), 600 mg (09/13/2018), 600 mg (09/20/2018), 600 mg (09/27/2018), 600 mg (10/04/2018), 600 mg (10/11/2018), 600 mg (10/18/2018), 600 mg (10/25/2018), 600 mg (11/01/2018), 600 mg (11/08/2018), 600 mg (11/15/2018), 600 mg (11/22/2018), 600 mg (11/29/2018), 600 mg (12/08/2018), 600 mg (12/16/2018)  for chemotherapy treatment.       INTERVAL HISTORY:  Mr. Wuthrich 58 y.o. male seen for follow-up of multiple myeloma.  Numbness in the bottom of the feet has been stable.  Appetite and energy levels are 50%.  Denies any fevers, night sweats or weight loss.  Denies any nausea, vomiting, diarrhea or constipation.  Requests refills for pain medication.  REVIEW OF SYSTEMS:  Review of Systems  Neurological: Positive for numbness.  All other systems reviewed and are negative.    PAST MEDICAL/SURGICAL HISTORY:  Past Medical History:  Diagnosis Date  . Allergy   . Arthritis    neck and back  . Blood transfusion without reported diagnosis   . BPH (benign prostatic hyperplasia)   . Cancer [facility] 2004   testicle  . Chronic kidney disease    kidney stones  . Left lumbar radiculopathy 06/12/2016  . 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 Medical history non-contributory    Pt has scattered thoughts and uncertain of past medical  history  . 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 Stones, urinary tract   . Substance abuse ([place])    prescribed oxydcodone- 10 years   Past Surgical History:  Procedure Laterality Date  . BACK SURGERY     x5  .  CERVICAL DISC SURGERY     x2  . COLONOSCOPY WITH PROPOFOL N/A 08/11/2016   Dr. Gala Romney: non-bleeding internal hemorrhoids, one 4 mm hyperplastic rectal polyp, diverticulosis in entire examined colon  . POLYPECTOMY  08/11/2016   Procedure: POLYPECTOMY;  Surgeon: [name], MD;  Location: [facility] ENDO SUITE;  Service: Endoscopy;;  colon  . PORTACATH PLACEMENT Left 09/20/2018   Procedure: INSERTION PORT-A-CATH (catheter attached left subclavian);  Surgeon: Virl Cagey, MD;  Location: [facility] ORS;  Service: General;  Laterality: Left;  . testicular cancer  2004  . TONSILLECTOMY       SOCIAL HISTORY:  Social History   Socioeconomic History  . Marital status: Single    Spouse name: Not on file  . Number of children: 1  . Years of education: 59  . Highest education level: Not on file  Occupational History  . Occupation: retired    Comment: Psychologist, prison and probation services  . Occupation: disabled  Social Needs  . Financial resource strain: Not on file  . Food insecurity    Worry: Not on file    Inability: Not on file  . Transportation needs    Medical: Not on file    Non-medical: Not on file  Tobacco Use  . Smoking status: Current Every Day Smoker    Packs/day: 1.00    Years: 40.00    Pack years: 40.00    Types: Cigarettes    Start date: 03/31/1974  . Smokeless tobacco: Never Used  Substance and Sexual Activity  . Alcohol use: Yes    Comment: Occasional  . Drug use: No    Types: Cocaine    Comment: Remote hx of cocaine, quit 1989  . Sexual activity: Yes  Lifestyle  . Physical activity    Days per week: Not on file    Minutes per session: Not on file  . Stress: Not on file  Relationships  . Social Herbalist on phone: Not on file    Gets together: Not on file    Attends religious service: Not on file    Active member of club or organization: Not on file    Attends meetings of clubs or organizations: Not on file    Relationship status: Not on file  . Intimate partner violence     Fear of current or ex partner: No    Emotionally abused: No    Physically abused: No    Forced sexual activity: No  Other Topics Concern  . Not on file  Social History Narrative   [employer] for 12 years   [employer] for 51 years      Never married   One daughter      Lives alone   Retail banker   Right-handed   Occasional caffeine use          FAMILY HISTORY:  Family History  Problem Relation Age of Onset  . COPD Mother   . Heart disease Mother   . Other Father        Never knew his father.  . Thyroid disease Sister   . Arthritis Sister   . Heart disease Sister        bypass  .  Colon cancer Neg Hx     CURRENT MEDICATIONS:  Outpatient Encounter Medications as of 01/24/2019  Medication Sig  . bortezomib IV (VELCADE) 3.5 MG injection Inject 1.3 mg/m2 into the vein once a week.   . calcitRIOL (ROCALTROL) 0.5 MCG capsule Take 0.5 mcg by mouth daily.   . CYCLOPHOSPHAMIDE IV Inject into the vein once a week.  Marland Kitchen Epoetin Alfa (PROCRIT IJ) Inject as directed. Unsure of dosage- gets this once weekly  . HYDROmorphone (DILAUDID) 4 MG tablet Take 1 tablet (4 mg total) by mouth every 8 (eight) hours as needed for severe pain.  . Patiromer Sorbitex Calcium (VELTASSA PO) Take by mouth 2 (two) times a week.   . senna (SENOKOT) 8.6 MG tablet Take 1 tablet by mouth 2 (two) times daily.   . sodium bicarbonate 650 MG tablet Take 1,300 mg by mouth 2 (two) times daily.  Marland Kitchen torsemide (DEMADEX) 20 MG tablet TAKING 1 TABLET DAILY, AND 2 TABLETS ONLY IF HIS ANKLES ARE REALLY SWOLLEN  . valACYclovir (VALTREX) 500 MG tablet Take 1 tablet (500 mg total) by mouth 2 (two) times daily.  . Misc. Devices MISC Please provide patient with rollaider.  Dx: multiple myeloma C90.0 (Patient not taking: Reported on 01/10/2019)   No facility-administered encounter medications on file as of 01/24/2019.     ALLERGIES:  Allergies  Allergen Reactions  . Oxycodone-Acetaminophen   .  Acetaminophen Nausea Only and Other (See Comments)    Elevated liver enzymes  . Cymbalta [Duloxetine Hcl] Swelling    Facial swelling  . Diclofenac Sodium Swelling       . Diclofenac Sodium Swelling  . Imodium [Loperamide] Swelling    facial  . Lyrica [Pregabalin] Swelling  . Morphine Other (See Comments)    Bradycardia   . Vioxx [Rofecoxib] Swelling  . Aleve [Naproxen] Swelling    eye     PHYSICAL EXAM:  ECOG Performance status: 1  Vitals:   01/24/19 1102  BP: (!) 151/85  Pulse: 79  Resp: 18  Temp: (!) 97.3 F (36.3 C)  SpO2: 100%   Filed Weights   01/24/19 1102  Weight: 174 lb 9.6 oz (79.2 kg)    Physical Exam Vitals signs reviewed.  Constitutional:      Appearance: Normal appearance. He is normal weight.  Cardiovascular:     Rate and Rhythm: Normal rate and regular rhythm.     Heart sounds: Normal heart sounds.  Pulmonary:     Effort: Pulmonary effort is normal.     Breath sounds: Normal breath sounds.  Abdominal:     General: Bowel sounds are normal.     Palpations: Abdomen is soft.  Musculoskeletal: Normal range of motion.     Right lower leg: No edema.     Left lower leg: No edema.  Skin:    General: Skin is warm and dry.  Neurological:     Mental Status: He is alert and oriented to person, place, and time. Mental status is at baseline.  Psychiatric:        Mood and Affect: Mood normal.        Behavior: Behavior normal.      LABORATORY DATA:  I have reviewed the labs as listed.  CBC    Component Value Date/Time   WBC 6.2 01/24/2019 1044   RBC 3.33 (L) 01/24/2019 1044   HGB 11.5 (L) 01/24/2019 1044   HCT 36.4 (L) 01/24/2019 1044   PLT 200 01/24/2019 1044   MCV 109.3 (H) 01/24/2019  1044   MCH 34.5 (H) 01/24/2019 1044   MCHC 31.6 01/24/2019 1044   RDW 17.5 (H) 01/24/2019 1044   LYMPHSABS 1.0 01/24/2019 1044   MONOABS 0.9 01/24/2019 1044   EOSABS 0.4 01/24/2019 1044   BASOSABS 0.0 01/24/2019 1044   CMP Latest Ref Rng & Units  01/24/2019 01/17/2019 01/10/2019  Glucose 70 - 99 mg/dL 87 107(H) 83  BUN 6 - 20 mg/dL 41(H) 41(H) 41(H)  Creatinine 0.61 - 1.24 mg/dL 4.07(H) 3.99(H) 4.35(H)  Sodium 135 - 145 mmol/L 140 139 140  Potassium 3.5 - 5.1 mmol/L 4.7 5.2(H) 4.7  Chloride 98 - 111 mmol/L 106 107 109  CO2 22 - 32 mmol/L 23 23 23   Calcium 8.9 - 10.3 mg/dL 8.9 8.8(L) 8.0(L)  Total Protein 6.5 - 8.1 g/dL 7.0 6.6 6.8  Total Bilirubin 0.3 - 1.2 mg/dL 0.7 0.8 0.6  Alkaline Phos 38 - 126 U/L 74 68 77  AST 15 - 41 U/L 15 15 15   ALT 0 - 44 U/L 10 10 11       I have personally reviewed his scans and discussed with him.     ASSESSMENT & PLAN:   Kappa light chain myeloma ([place]) 1.  Kappa light chain myeloma, high risk, stage III: -Presentation with acute renal failure and hypercalcemia.  M spike was 0.4 g.  Immunofixation did not show any immunoglobulin heavy chain.  Kappa free light chains was 12,085.  Free light chain ratio was 1981 and LDH of 224. -Bone marrow biopsy on 06/16/2018 shows 87% atypical plasma cells.  Cytogenetics shows complex chromosome abnormalities.  Gain of 1 q. was seen.  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 - Myeloma labs from 11/01/2018 shows no M spike.  Free light chain ratio improved to 95.5.  Kappa light chains improved to 1347.   - MRI of the brain for left-sided facial numbness was negative. - He was evaluated by Dr. Aris Lot at [facility] for transplant on 12/07/2018. - He is continuing to tolerate treatments very well. -Myeloma panel on 01/03/2019 shows kappa light chains of 140 and ratio of 4.7.  This was previously 270 and 10.37 respectively on 12/08/2018. - We have held his cyclophosphamide after 6 cycles, to minimize difficulty collecting stem cells. - SPEP and IFE were negative on 01/17/19.  -We will reach out to Dr. Aris Lot for stem cell collection.  Patient is getting all affairs in order to proceed with the transplant. -  Recommend continue weekly Velcade and  dexamethasone 20 mg. - He will return to clinic in 4 weeks for provider visit.   2.  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 -He will continue Retacrit 20,000 units weekly.   3.  Hyperkalemia: - He will continue Veltassa.  Potassium is normal.  4.  ID prophylaxis: - He had skin rash with acyclovir.  He will continue Valtrex 500 mg twice daily.  5.  Low back pain: - He will continue Dilaudid 4 mg every 8 hours which is controlling his pain.  6.  Lower extremity edema: -He will continue torsemide 20 mg daily.  Total time spent is 25 minutes with more than 50% of the time spent face-to-face discussing treatment plan, counseling and coordination of care.    Orders placed this encounter:  No orders of the defined types were placed in this encounter.     Derek Jack, MD [place] [phone]

## 2019-01-24 NOTE — Progress Notes (Signed)
1128 Labs reviewed with and pt seen by Dr. Delton Coombes and pt approved for Velcade injection today per MD                [name] tolerated Velcade injection well without complaints or incident. Hgb 11.5 today so Retacrit was held according to parameters given. Pt discharged self ambulatory in satisfactory condition

## 2019-01-24 NOTE — Assessment & Plan Note (Addendum)
1.  Kappa light chain myeloma, high risk, stage III: -Presentation with acute renal failure and hypercalcemia.  M spike was 0.4 g.  Immunofixation did not show any immunoglobulin heavy chain.  Kappa free light chains was 12,085.  Free light chain ratio was 1981 and LDH of 224. -Bone marrow biopsy on 06/16/2018 shows 87% atypical plasma cells.  Cytogenetics shows complex chromosome abnormalities.  Gain of 1 q. was seen.  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 - MRI of the brain for left-sided facial numbness was negative. - He was evaluated by Dr. Aris Lot at [facility] for [facility] on 12/07/2018. - He is continuing to tolerate treatments very well. - We have held his cyclophosphamide after 6 cycles, to minimize difficulty collecting stem cells. -Myeloma panel on 01/17/2019 showed SPEP without M spike.  Kappa light chains have improved to 9.57.  Light chain ratio is 3.04. -We will reach out to Dr. Aris Lot for stem cell collection.  Patient is getting all affairs in order to proceed with the transplant. - Recommend continue weekly Velcade and  dexamethasone 20 mg. - He will return to clinic in 4 weeks for provider visit.   2.  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 -He will continue Retacrit 20,000 units weekly.   3.  Hyperkalemia: - He will continue Veltassa.  Potassium is normal.  4.  ID prophylaxis: - He had skin rash with acyclovir.  He will continue Valtrex 500 mg twice daily.  5.  Low back pain: - He will continue Dilaudid 4 mg every 8 hours which is controlling his pain.  6.  Lower extremity edema: -He will continue torsemide 20 mg daily.

## 2019-01-27 ENCOUNTER — Other Ambulatory Visit (HOSPITAL_COMMUNITY): Payer: Self-pay | Admitting: *Deleted

## 2019-01-27 MED ORDER — HYDROMORPHONE HCL 4 MG PO TABS: 4.0000 mg | ORAL_TABLET | Freq: Three times a day (TID) | ORAL | 0 refills | Status: DC | PRN

## 2019-01-31 ENCOUNTER — Other Ambulatory Visit: Payer: Self-pay

## 2019-01-31 ENCOUNTER — Encounter (HOSPITAL_COMMUNITY): Payer: Self-pay

## 2019-01-31 ENCOUNTER — Inpatient Hospital Stay (HOSPITAL_COMMUNITY): Payer: Medicare Other | Attending: Hematology

## 2019-01-31 ENCOUNTER — Inpatient Hospital Stay (HOSPITAL_COMMUNITY): Payer: Medicare Other

## 2019-01-31 VITALS — BP 158/80 | HR 84 | Temp 97.3°F | Resp 18

## 2019-01-31 DIAGNOSIS — N185 Chronic kidney disease, stage 5: Secondary | ICD-10-CM

## 2019-01-31 DIAGNOSIS — C9000 Multiple myeloma not having achieved remission: Secondary | ICD-10-CM | POA: Insufficient documentation

## 2019-01-31 DIAGNOSIS — Z5112 Encounter for antineoplastic immunotherapy: Secondary | ICD-10-CM | POA: Insufficient documentation

## 2019-01-31 DIAGNOSIS — D631 Anemia in chronic kidney disease: Secondary | ICD-10-CM | POA: Diagnosis not present

## 2019-01-31 DIAGNOSIS — N189 Chronic kidney disease, unspecified: Secondary | ICD-10-CM | POA: Diagnosis not present

## 2019-01-31 DIAGNOSIS — E875 Hyperkalemia: Secondary | ICD-10-CM | POA: Insufficient documentation

## 2019-01-31 LAB — CBC WITH DIFFERENTIAL/PLATELET
Abs Immature Granulocytes: 0.02 10*3/uL (ref 0.00–0.07)
Basophils Absolute: 0 10*3/uL (ref 0.0–0.1)
Basophils Relative: 1 %
Eosinophils Absolute: 0.3 10*3/uL (ref 0.0–0.5)
Eosinophils Relative: 5 %
HCT: 37.2 % — ABNORMAL LOW (ref 39.0–52.0)
Hemoglobin: 11.7 g/dL — ABNORMAL LOW (ref 13.0–17.0)
Immature Granulocytes: 0 %
Lymphocytes Relative: 16 %
Lymphs Abs: 0.9 10*3/uL (ref 0.7–4.0)
MCH: 34.9 pg — ABNORMAL HIGH (ref 26.0–34.0)
MCHC: 31.5 g/dL (ref 30.0–36.0)
MCV: 111 fL — ABNORMAL HIGH (ref 80.0–100.0)
Monocytes Absolute: 0.6 10*3/uL (ref 0.1–1.0)
Monocytes Relative: 11 %
Neutro Abs: 4 10*3/uL (ref 1.7–7.7)
Neutrophils Relative %: 67 %
Platelets: 198 10*3/uL (ref 150–400)
RBC: 3.35 MIL/uL — ABNORMAL LOW (ref 4.22–5.81)
RDW: 16.9 % — ABNORMAL HIGH (ref 11.5–15.5)
WBC: 5.9 10*3/uL (ref 4.0–10.5)
nRBC: 0 % (ref 0.0–0.2)

## 2019-01-31 LAB — COMPREHENSIVE METABOLIC PANEL
ALT: 10 U/L (ref 0–44)
AST: 16 U/L (ref 15–41)
Albumin: 4.4 g/dL (ref 3.5–5.0)
Alkaline Phosphatase: 72 U/L (ref 38–126)
Anion gap: 12 (ref 5–15)
BUN: 44 mg/dL — ABNORMAL HIGH (ref 6–20)
CO2: 24 mmol/L (ref 22–32)
Calcium: 8.9 mg/dL (ref 8.9–10.3)
Chloride: 105 mmol/L (ref 98–111)
Creatinine, Ser: 4.39 mg/dL — ABNORMAL HIGH (ref 0.61–1.24)
GFR calc Af Amer: 16 mL/min — ABNORMAL LOW (ref >60)
GFR calc non Af Amer: 14 mL/min — ABNORMAL LOW (ref >60)
Glucose, Bld: 89 mg/dL (ref 70–99)
Potassium: 5.1 mmol/L (ref 3.5–5.1)
Sodium: 141 mmol/L (ref 135–145)
Total Bilirubin: 0.6 mg/dL (ref 0.3–1.2)
Total Protein: 7.2 g/dL (ref 6.5–8.1)

## 2019-01-31 LAB — MAGNESIUM: Magnesium: 2.2 mg/dL (ref 1.7–2.4)

## 2019-01-31 LAB — LACTATE DEHYDROGENASE: LDH: 216 U/L — ABNORMAL HIGH (ref 98–192)

## 2019-01-31 LAB — PHOSPHORUS: Phosphorus: 4.7 mg/dL — ABNORMAL HIGH (ref 2.5–4.6)

## 2019-01-31 MED ORDER — EPOETIN ALFA-EPBX 10000 UNIT/ML IJ SOLN
INTRAMUSCULAR | Status: AC
  Filled 2019-01-31: qty 2

## 2019-01-31 MED ORDER — EPOETIN ALFA-EPBX 10000 UNIT/ML IJ SOLN: 20000.0000 [IU] | Freq: Once | INTRAMUSCULAR | Status: DC

## 2019-01-31 MED ORDER — ONDANSETRON 8 MG PO TBDP
ORAL_TABLET | ORAL | Status: AC
  Filled 2019-01-31: qty 1

## 2019-01-31 MED ORDER — DEXAMETHASONE 4 MG PO TABS
ORAL_TABLET | ORAL | Status: AC
  Filled 2019-01-31: qty 5

## 2019-01-31 MED ORDER — DEXAMETHASONE 4 MG PO TABS
20.0000 mg | ORAL_TABLET | Freq: Once | ORAL | Status: AC
  Administered 2019-01-31: 20 mg via ORAL

## 2019-01-31 MED ORDER — BORTEZOMIB CHEMO SQ INJECTION 3.5 MG (2.5MG/ML)
1.3000 mg/m2 | Freq: Once | INTRAMUSCULAR | Status: AC
  Administered 2019-01-31: 2.5 mg via SUBCUTANEOUS
  Filled 2019-01-31: qty 1

## 2019-01-31 MED ORDER — ONDANSETRON HCL 4 MG PO TABS
8.0000 mg | ORAL_TABLET | Freq: Once | ORAL | Status: AC
  Administered 2019-01-31: 8 mg via ORAL
  Filled 2019-01-31: qty 2

## 2019-01-31 MED ORDER — HYDROMORPHONE HCL 4 MG PO TABS: 4.0000 mg | ORAL_TABLET | Freq: Three times a day (TID) | ORAL | 0 refills | Status: DC | PRN

## 2019-01-31 NOTE — Progress Notes (Signed)
Pt presents today for Velcade and Retacrit. MAR reviewed. Pt requesting refill on his pain medication. Message sent to RNester NP. Creat today 4.39. Reported to RNester NP. VS within parameters for tx. Pt complains of back pain he rates a 5 today. Pt denies any changes since the last visit.   Velcade given today per MD orders. Tolerated injection without adverse affects. Vital signs stable. No complaints at this time. Discharged from clinic ambulatory. F/U with [facility] as scheduled.

## 2019-01-31 NOTE — Patient Instructions (Signed)
[facility] Discharge Instructions for Patients Receiving Chemotherapy  Today you received the following chemotherapy agents   To help prevent nausea and vomiting after your treatment, we encourage you to take your nausea medication   If you develop nausea and vomiting that is not controlled by your nausea medication, call the clinic.   BELOW ARE SYMPTOMS THAT SHOULD BE REPORTED IMMEDIATELY:  *FEVER GREATER THAN 100.5 F  *CHILLS WITH OR WITHOUT FEVER  NAUSEA AND VOMITING THAT IS NOT CONTROLLED WITH YOUR NAUSEA MEDICATION  *UNUSUAL SHORTNESS OF BREATH  *UNUSUAL BRUISING OR BLEEDING  TENDERNESS IN MOUTH AND THROAT WITH OR WITHOUT PRESENCE OF ULCERS  *URINARY PROBLEMS  *BOWEL PROBLEMS  UNUSUAL RASH Items with * indicate a potential emergency and should be followed up as soon as possible.  Feel free to call the clinic should you have any questions or concerns. The clinic phone number is [phone].  Please show the CHEMO ALERT CARD at check-in to the Emergency Department and triage nurse.   

## 2019-02-02 ENCOUNTER — Other Ambulatory Visit (HOSPITAL_COMMUNITY): Payer: Self-pay | Admitting: Hematology

## 2019-02-02 MED ORDER — HYDROMORPHONE HCL 4 MG PO TABS: 4.0000 mg | ORAL_TABLET | Freq: Three times a day (TID) | ORAL | 0 refills | Status: DC | PRN

## 2019-02-06 ENCOUNTER — Other Ambulatory Visit (HOSPITAL_COMMUNITY): Payer: Self-pay | Admitting: Hematology

## 2019-02-06 DIAGNOSIS — C9000 Multiple myeloma not having achieved remission: Secondary | ICD-10-CM

## 2019-02-07 ENCOUNTER — Inpatient Hospital Stay (HOSPITAL_COMMUNITY): Payer: Medicare Other

## 2019-02-07 ENCOUNTER — Encounter (HOSPITAL_COMMUNITY): Payer: Self-pay

## 2019-02-07 ENCOUNTER — Other Ambulatory Visit: Payer: Self-pay

## 2019-02-07 VITALS — BP 132/67 | HR 83 | Temp 97.6°F | Resp 18

## 2019-02-07 DIAGNOSIS — C9000 Multiple myeloma not having achieved remission: Secondary | ICD-10-CM

## 2019-02-07 DIAGNOSIS — Z5112 Encounter for antineoplastic immunotherapy: Secondary | ICD-10-CM | POA: Diagnosis not present

## 2019-02-07 LAB — COMPREHENSIVE METABOLIC PANEL
ALT: 11 U/L (ref 0–44)
AST: 14 U/L — ABNORMAL LOW (ref 15–41)
Albumin: 4.2 g/dL (ref 3.5–5.0)
Alkaline Phosphatase: 83 U/L (ref 38–126)
Anion gap: 10 (ref 5–15)
BUN: 50 mg/dL — ABNORMAL HIGH (ref 6–20)
CO2: 24 mmol/L (ref 22–32)
Calcium: 9 mg/dL (ref 8.9–10.3)
Chloride: 103 mmol/L (ref 98–111)
Creatinine, Ser: 4.7 mg/dL — ABNORMAL HIGH (ref 0.61–1.24)
GFR calc Af Amer: 15 mL/min — ABNORMAL LOW (ref >60)
GFR calc non Af Amer: 13 mL/min — ABNORMAL LOW (ref >60)
Glucose, Bld: 90 mg/dL (ref 70–99)
Potassium: 4.9 mmol/L (ref 3.5–5.1)
Sodium: 137 mmol/L (ref 135–145)
Total Bilirubin: 0.6 mg/dL (ref 0.3–1.2)
Total Protein: 7 g/dL (ref 6.5–8.1)

## 2019-02-07 LAB — CBC WITH DIFFERENTIAL/PLATELET
Abs Immature Granulocytes: 0.02 10*3/uL (ref 0.00–0.07)
Basophils Absolute: 0 10*3/uL (ref 0.0–0.1)
Basophils Relative: 0 %
Eosinophils Absolute: 0.3 10*3/uL (ref 0.0–0.5)
Eosinophils Relative: 5 %
HCT: 35.8 % — ABNORMAL LOW (ref 39.0–52.0)
Hemoglobin: 11.6 g/dL — ABNORMAL LOW (ref 13.0–17.0)
Immature Granulocytes: 0 %
Lymphocytes Relative: 16 %
Lymphs Abs: 1.1 10*3/uL (ref 0.7–4.0)
MCH: 35.6 pg — ABNORMAL HIGH (ref 26.0–34.0)
MCHC: 32.4 g/dL (ref 30.0–36.0)
MCV: 109.8 fL — ABNORMAL HIGH (ref 80.0–100.0)
Monocytes Absolute: 0.9 10*3/uL (ref 0.1–1.0)
Monocytes Relative: 13 %
Neutro Abs: 4.5 10*3/uL (ref 1.7–7.7)
Neutrophils Relative %: 66 %
Platelets: 155 10*3/uL (ref 150–400)
RBC: 3.26 MIL/uL — ABNORMAL LOW (ref 4.22–5.81)
RDW: 15.8 % — ABNORMAL HIGH (ref 11.5–15.5)
WBC: 6.9 10*3/uL (ref 4.0–10.5)
nRBC: 0 % (ref 0.0–0.2)

## 2019-02-07 LAB — LACTATE DEHYDROGENASE: LDH: 180 U/L (ref 98–192)

## 2019-02-07 LAB — PHOSPHORUS: Phosphorus: 4.6 mg/dL (ref 2.5–4.6)

## 2019-02-07 LAB — MAGNESIUM: Magnesium: 2.1 mg/dL (ref 1.7–2.4)

## 2019-02-07 MED ORDER — BORTEZOMIB CHEMO SQ INJECTION 3.5 MG (2.5MG/ML)
1.3000 mg/m2 | Freq: Once | INTRAMUSCULAR | Status: AC
  Administered 2019-02-07: 2.5 mg via SUBCUTANEOUS
  Filled 2019-02-07: qty 1

## 2019-02-07 MED ORDER — DEXAMETHASONE 4 MG PO TABS
20.0000 mg | ORAL_TABLET | Freq: Once | ORAL | Status: AC
  Administered 2019-02-07: 20 mg via ORAL
  Filled 2019-02-07: qty 5

## 2019-02-07 MED ORDER — SODIUM CHLORIDE 0.9 % IV SOLN: Freq: Once | INTRAVENOUS | Status: DC

## 2019-02-07 MED ORDER — ONDANSETRON HCL 4 MG PO TABS
8.0000 mg | ORAL_TABLET | Freq: Once | ORAL | Status: AC
  Administered 2019-02-07: 8 mg via ORAL
  Filled 2019-02-07: qty 2

## 2019-02-07 NOTE — Progress Notes (Signed)
Serum Creatinine 4.70 today and Dr. Delton Coombes made aware.  Ok to treat today verbal order Dr. Delton Coombes.   Patient tolerated injection with no complaints voiced.  Site clean and dry with no bruising or swelling noted at site.  Band aid applied.  Vss with discharge and left ambulatory with no s/s of distress noted.

## 2019-02-08 ENCOUNTER — Other Ambulatory Visit: Payer: Self-pay

## 2019-02-08 ENCOUNTER — Encounter: Payer: Self-pay | Admitting: Gastroenterology

## 2019-02-08 ENCOUNTER — Ambulatory Visit (INDEPENDENT_AMBULATORY_CARE_PROVIDER_SITE_OTHER): Payer: Medicare Other | Admitting: Gastroenterology

## 2019-02-08 VITALS — BP 109/63 | HR 98 | Temp 97.3°F | Ht 72.0 in | Wt 174.4 lb

## 2019-02-08 DIAGNOSIS — K625 Hemorrhage of anus and rectum: Secondary | ICD-10-CM

## 2019-02-08 NOTE — Patient Instructions (Signed)
We will see you back as needed!  Please call us if you have any recurrent bleeding!  I enjoyed seeing you again today! As you know, I value our relationship and want to provide genuine, compassionate, and quality care. I welcome your feedback. If you receive a survey regarding your visit,  I greatly appreciate you taking time to fill this out. See you next time!  Annitta Needs, PhD, ANP-BC [facility]

## 2019-02-08 NOTE — Progress Notes (Signed)
Referring Provider: Rosita Fire, MD Primary Care Physician:  Rosita Fire, MD  Primary GI: Dr. Gala Romney   Chief Complaint  Patient presents with  . Rectal Bleeding    only when he eats red meats so he stopped eating red meats     HPI:   [name] is a 58 y.o. male presenting today with a history of CKD, macrocytic anemia, multiple myeloma, inpatient April 2020 with rectal bleeding. Colonoscopy last completed in 2018 with non-bleeding internal hemorrhoids and 4 mm rectal polyp. Felt to have bleeding from internal hemorrhoids. Prior hemorrhoid banding  2018: right anterior, left lateral. Jan 2019: right posterior, band in neutral position. May 2019: left lateral band X 2  When stopped eating ground beef, rectal bleeding stopped. Ate BBQ last night from [employer] with cabbage and had pain later that evening. Main concern is possible stem cell transplant and kidney function. Talkative and discussing multiple non-GI issues today. States he is doing well from that standpoint.     Past Medical History:  Diagnosis Date  . Allergy   . Arthritis    neck and back  . Blood transfusion without reported diagnosis   . BPH (benign prostatic hyperplasia)   . Cancer [facility]) 2004   testicle  . Chronic kidney disease    kidney stones  . Left lumbar radiculopathy 06/12/2016  . 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 Medical history non-contributory    Pt has scattered thoughts and uncertain of past medical history  . 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 Stones, urinary tract   . Substance abuse ([place])    prescribed oxydcodone- 10 years    Past Surgical History:  Procedure Laterality Date  . BACK SURGERY     x5  . CERVICAL DISC SURGERY     x2  . COLONOSCOPY WITH PROPOFOL N/A 08/11/2016   Dr. Gala Romney: non-bleeding internal hemorrhoids, one 4 mm hyperplastic rectal polyp, diverticulosis in entire examined colon  . POLYPECTOMY  08/11/2016   Procedure: POLYPECTOMY;  Surgeon: [name],  MD;  Location: [facility] ENDO SUITE;  Service: Endoscopy;;  colon  . PORTACATH PLACEMENT Left 09/20/2018   Procedure: INSERTION PORT-A-CATH (catheter attached left subclavian);  Surgeon: Virl Cagey, MD;  Location: [facility] ORS;  Service: General;  Laterality: Left;  . testicular cancer  2004  . TONSILLECTOMY      Current Outpatient Medications  Medication Sig Dispense Refill  . bortezomib IV (VELCADE) 3.5 MG injection Inject 1.3 mg/m2 into the vein once a week.     . calcitRIOL (ROCALTROL) 0.5 MCG capsule Take 0.5 mcg by mouth daily.     . CYCLOPHOSPHAMIDE IV Inject into the vein once a week.    Marland Kitchen Epoetin Alfa (PROCRIT IJ) Inject as directed. Unsure of dosage- gets this once weekly    . HYDROmorphone (DILAUDID) 4 MG tablet Take 1 tablet (4 mg total) by mouth every 8 (eight) hours as needed for severe pain. 84 tablet 0  . Misc. Devices MISC Please provide patient with rollaider.  Dx: multiple myeloma C90.0 1 each 99  . Patiromer Sorbitex Calcium (VELTASSA PO) Take by mouth 2 (two) times a week.     . senna (SENOKOT) 8.6 MG tablet Take 1 tablet by mouth 2 (two) times daily.     . sodium bicarbonate 650 MG tablet Take 1,300 mg by mouth 2 (two) times daily.    Marland Kitchen torsemide (DEMADEX) 20 MG tablet TAKING 1 TABLET DAILY, AND 2 TABLETS ONLY IF HIS ANKLES ARE REALLY  SWOLLEN 180 tablet 1  . valACYclovir (VALTREX) 500 MG tablet TAKE 1 TABLET BY MOUTH TWICE A DAY 180 tablet 2   No current facility-administered medications for this visit.     Allergies as of 02/08/2019 - Review Complete 02/08/2019  Allergen Reaction Noted  . Oxycodone-acetaminophen  09/20/2018  . Acetaminophen Nausea Only and Other (See Comments) 01/17/2015  . Cymbalta [duloxetine hcl] Swelling 07/09/2016  . Diclofenac sodium Swelling 02/25/2010  . Diclofenac sodium Swelling 02/25/2010  . Imodium [loperamide] Swelling 01/17/2015  . Lyrica [pregabalin] Swelling 06/18/2016  . Morphine Other (See Comments) 02/25/2010  . Vioxx [rofecoxib]  Swelling 07/18/2015  . Aleve [naproxen] Swelling 07/18/2015    Family History  Problem Relation Age of Onset  . COPD Mother   . Heart disease Mother   . Other Father        Never knew his father.  . Thyroid disease Sister   . Arthritis Sister   . Heart disease Sister        bypass  . Colon cancer Neg Hx     Social History   Socioeconomic History  . Marital status: Single    Spouse name: Not on file  . Number of children: 1  . Years of education: 16  . Highest education level: Not on file  Occupational History  . Occupation: retired    Comment: [employer]  . Occupation: disabled  Social Needs  . Financial resource strain: Not on file  . Food insecurity    Worry: Not on file    Inability: Not on file  . Transportation needs    Medical: Not on file    Non-medical: Not on file  Tobacco Use  . Smoking status: Current Every Day Smoker    Packs/day: 1.00    Years: 40.00    Pack years: 40.00    Types: Cigarettes    Start date: 03/31/1974  . Smokeless tobacco: Never Used  Substance and Sexual Activity  . Alcohol use: Yes    Comment: Occasional  . Drug use: No    Types: Cocaine    Comment: Remote hx of cocaine, quit 1989  . Sexual activity: Yes  Lifestyle  . Physical activity    Days per week: Not on file    Minutes per session: Not on file  . Stress: Not on file  Relationships  . Social connections    Talks on phone: Not on file    Gets together: Not on file    Attends religious service: Not on file    Active member of club or organization: Not on file    Attends meetings of clubs or organizations: Not on file    Relationship status: Not on file  Other Topics Concern  . Not on file  Social History Narrative   [employer] for [employer] years   [employer] for 20 years      Never married   One daughter      Lives alone   Collect stamps   Plays trumpet   Right-handed   Occasional caffeine use          Review of Systems: Gen: Denies fever, chills, anorexia.  Denies fatigue, weakness, weight loss.  CV: Denies chest pain, palpitations, syncope, peripheral edema, and claudication. Resp: Denies dyspnea at rest, cough, wheezing, coughing up blood, and pleurisy. GI: Denies vomiting blood, jaundice, and fecal incontinence.   Denies dysphagia or odynophagia. Derm: Denies rash, itching, dry skin Psych: Denies depression, anxiety, memory loss, confusion. No homicidal   or suicidal ideation.  Heme: Denies bruising, bleeding, and enlarged lymph nodes.  Physical Exam: BP 109/63   Pulse 98   Temp (!) 97.3 F (36.3 C) (Oral)   Ht 6' (1.829 m)   Wt 174 lb 6.4 oz (79.1 kg)   BMI 23.65 kg/m  General:   Alert and oriented. No distress noted. Pleasant and cooperative. Circuitous pattern, difficult to obtain history from patient Head:  Normocephalic and atraumatic. Abdomen:  +BS, soft, non-tender and non-distended. No rebound or guarding. No HSM or masses noted. Msk:  Symmetrical without gross deformities. Normal posture. Extremities:  Without edema. Neurologic:  Alert and  oriented x4 Psych:  Alert and cooperative. Normal mood and affect.  ASSESSMENT/PLAN: Nachman Sundt is a 58 y.o. male presenting today with history of rectal bleeding likely due to hemorrhoids. Colonoscopy on file from 2018, with hemorrhoid banding thereafter. Inpatient April 2020 with rectal bleeding felt to be related to hemorrhoids. Currently, this is resolved. Multiple non-GI concerns today, and stable from GI standpoint. Difficult historian to keep focused on questions but very pleasant. We will see him back as needed. If further rectal bleeding, would recommend early interval colonoscopy.    Annitta Needs, PhD, ANP-BC [facility]

## 2019-02-08 NOTE — Progress Notes (Signed)
cc'ed to pcp °

## 2019-02-11 ENCOUNTER — Encounter (HOSPITAL_COMMUNITY): Payer: Self-pay | Admitting: *Deleted

## 2019-02-11 NOTE — Progress Notes (Signed)
I called patient today and left a voicemail for his return call.  I wanted to talk with him about his plan for transplant.  I also called Urban Gibson, RN with transplant team at [facility] to get an update on his plan and left a message with her as well. I will await return calls from both of them.

## 2019-02-14 ENCOUNTER — Other Ambulatory Visit: Payer: Self-pay

## 2019-02-14 ENCOUNTER — Inpatient Hospital Stay (HOSPITAL_COMMUNITY): Payer: Medicare Other

## 2019-02-14 ENCOUNTER — Encounter (HOSPITAL_COMMUNITY): Payer: Self-pay | Admitting: Hematology

## 2019-02-14 ENCOUNTER — Inpatient Hospital Stay (HOSPITAL_BASED_OUTPATIENT_CLINIC_OR_DEPARTMENT_OTHER): Payer: Medicare Other | Admitting: Hematology

## 2019-02-14 DIAGNOSIS — C9000 Multiple myeloma not having achieved remission: Secondary | ICD-10-CM

## 2019-02-14 DIAGNOSIS — N185 Chronic kidney disease, stage 5: Secondary | ICD-10-CM

## 2019-02-14 DIAGNOSIS — Z5112 Encounter for antineoplastic immunotherapy: Secondary | ICD-10-CM | POA: Diagnosis not present

## 2019-02-14 LAB — COMPREHENSIVE METABOLIC PANEL
ALT: 10 U/L (ref 0–44)
AST: 13 U/L — ABNORMAL LOW (ref 15–41)
Albumin: 4.1 g/dL (ref 3.5–5.0)
Alkaline Phosphatase: 79 U/L (ref 38–126)
Anion gap: 10 (ref 5–15)
BUN: 54 mg/dL — ABNORMAL HIGH (ref 6–20)
CO2: 25 mmol/L (ref 22–32)
Calcium: 9 mg/dL (ref 8.9–10.3)
Chloride: 101 mmol/L (ref 98–111)
Creatinine, Ser: 4.67 mg/dL — ABNORMAL HIGH (ref 0.61–1.24)
GFR calc Af Amer: 15 mL/min — ABNORMAL LOW (ref >60)
GFR calc non Af Amer: 13 mL/min — ABNORMAL LOW (ref >60)
Glucose, Bld: 108 mg/dL — ABNORMAL HIGH (ref 70–99)
Potassium: 4.4 mmol/L (ref 3.5–5.1)
Sodium: 136 mmol/L (ref 135–145)
Total Bilirubin: 0.6 mg/dL (ref 0.3–1.2)
Total Protein: 6.7 g/dL (ref 6.5–8.1)

## 2019-02-14 LAB — CBC WITH DIFFERENTIAL/PLATELET
Abs Immature Granulocytes: 0.01 10*3/uL (ref 0.00–0.07)
Basophils Absolute: 0 10*3/uL (ref 0.0–0.1)
Basophils Relative: 1 %
Eosinophils Absolute: 0.4 10*3/uL (ref 0.0–0.5)
Eosinophils Relative: 7 %
HCT: 33 % — ABNORMAL LOW (ref 39.0–52.0)
Hemoglobin: 10.6 g/dL — ABNORMAL LOW (ref 13.0–17.0)
Immature Granulocytes: 0 %
Lymphocytes Relative: 19 %
Lymphs Abs: 1.1 10*3/uL (ref 0.7–4.0)
MCH: 35.3 pg — ABNORMAL HIGH (ref 26.0–34.0)
MCHC: 32.1 g/dL (ref 30.0–36.0)
MCV: 110 fL — ABNORMAL HIGH (ref 80.0–100.0)
Monocytes Absolute: 0.6 10*3/uL (ref 0.1–1.0)
Monocytes Relative: 11 %
Neutro Abs: 3.5 10*3/uL (ref 1.7–7.7)
Neutrophils Relative %: 62 %
Platelets: 160 10*3/uL (ref 150–400)
RBC: 3 MIL/uL — ABNORMAL LOW (ref 4.22–5.81)
RDW: 15.5 % (ref 11.5–15.5)
WBC: 5.6 10*3/uL (ref 4.0–10.5)
nRBC: 0 % (ref 0.0–0.2)

## 2019-02-14 LAB — PHOSPHORUS: Phosphorus: 4.8 mg/dL — ABNORMAL HIGH (ref 2.5–4.6)

## 2019-02-14 LAB — MAGNESIUM: Magnesium: 2.3 mg/dL (ref 1.7–2.4)

## 2019-02-14 LAB — LACTATE DEHYDROGENASE: LDH: 170 U/L (ref 98–192)

## 2019-02-14 MED ORDER — DEXAMETHASONE 4 MG PO TABS
20.0000 mg | ORAL_TABLET | Freq: Once | ORAL | Status: AC
  Administered 2019-02-14: 20 mg via ORAL

## 2019-02-14 MED ORDER — ONDANSETRON HCL 4 MG PO TABS
8.0000 mg | ORAL_TABLET | Freq: Once | ORAL | Status: AC
  Administered 2019-02-14: 8 mg via ORAL
  Filled 2019-02-14: qty 2

## 2019-02-14 MED ORDER — EPOETIN ALFA-EPBX 10000 UNIT/ML IJ SOLN
20000.0000 [IU] | Freq: Once | INTRAMUSCULAR | Status: AC
  Administered 2019-02-14: 15:00:00 20000 [IU] via SUBCUTANEOUS

## 2019-02-14 MED ORDER — BORTEZOMIB CHEMO SQ INJECTION 3.5 MG (2.5MG/ML)
1.3000 mg/m2 | Freq: Once | INTRAMUSCULAR | Status: AC
  Administered 2019-02-14: 2.5 mg via SUBCUTANEOUS
  Filled 2019-02-14: qty 1

## 2019-02-14 NOTE — Patient Instructions (Signed)
[place] at [facility] Discharge Instructions  You were seen today by Dr. Delton Coombes. He went over your recent lab results. Continue weekly treatments. He will see you back in 3 weeks for labs, treatment and follow up.   Thank you for choosing [place] at [facility] to provide your oncology and hematology care.  To afford each patient quality time with our provider, please arrive at least 15 minutes before your scheduled appointment time.   If you have a lab appointment with the [facility] please come in thru the  Main Entrance and check in at the main information desk  You need to re-schedule your appointment should you arrive 10 or more minutes late.  We strive to give you quality time with our providers, and arriving late affects you and other patients whose appointments are after yours.  Also, if you no show three or more times for appointments you may be dismissed from the clinic at the providers discretion.     Again, thank you for choosing [facility].  Our hope is that these requests will decrease the amount of time that you wait before being seen by our physicians.       _____________________________________________________________  Should you have questions after your visit to [facility], please contact our office at [phone] between the hours of 8:00 a.m. and 4:30 p.m.  Voicemails left after 4:00 p.m. will not be returned until the following business day.  For prescription refill requests, have your pharmacy contact our office and allow 72 hours.    Cancer Center Support Programs:   > Cancer Support Group  2nd Tuesday of the month 1pm-2pm, Journey Room

## 2019-02-14 NOTE — Progress Notes (Signed)
Labs reviewed with MD. Proceed with treatment per MD.  Retacrit and velcade injections given per orders.See MAR for details.    Patient tolerated it well without problems. Vitals stable and discharged home from clinic ambulatory. Follow up as scheduled.

## 2019-02-14 NOTE — Assessment & Plan Note (Addendum)
1.  Kappa light chain myeloma, high risk, stage III: -Presentation with acute renal failure and hypercalcemia.  M spike was 0.4 g.  Immunofixation did not show any immunoglobulin heavy chain.  Kappa free light chains was 12,085.  Free light chain ratio was 1981 and LDH of 224. -Bone marrow biopsy on 06/16/2018 shows 87% atypical plasma cells.  Cytogenetics shows complex chromosome abnormalities.  Gain of 1 q. was seen.  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 - MRI of the brain for left-sided facial numbness was negative. - He was evaluated by Dr. Aris Lot at [facility] for transplant on 12/07/2018. -Myeloma panel on 01/17/2019 shows kappa light chains of 95, free light chain ratio improved to 3.04. -We held cyclophosphamide since 12/16/2018. -He is continuing dexamethasone 20 mg weekly.  He is tolerating Velcade reasonably well.  He had a conversation with Dr.Lambird at [facility] regarding transplantation.  I have also called and talked to Dr. Aris Lot. -He decided to wait rather than proceeding with the transplant at this time. -We will continue Velcade and dexamethasone.  We have sent myeloma panel from today.  We will see him back in 3 weeks for follow-up.  2.  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 -He will continue Retacrit 20,000 units weekly.  3.  Hyperkalemia: -Potassium is 4.4.  We will continue Veltassa.  4.  ID prophylaxis: -he had skin rash with acyclovir.  He will continue Valtrex 500 mg twice daily.  5.  Low back pain: - He will continue Dilaudid 4 mg every 8 hours which is controlling his pain.  6.  Lower extremity edema: -As his creatinine has gone up to 4.7, I have recommended him to try taking torsemide 20 mg every other day.  If the swelling comes back, he will continue every day.

## 2019-02-14 NOTE — Progress Notes (Signed)
[name], [place] [address]   CLINIC:  Medical Oncology/Hematology  PCP:  Brandon Fire, MD [place] [id number] [phone]   REASON FOR VISIT: Follow-up for Multiple myeloma    BRIEF ONCOLOGIC HISTORY:  Oncology History  Kappa light chain myeloma ([place])  06/16/2018 Initial Diagnosis   Kappa light chain myeloma ([place])   06/17/2018 -  Chemotherapy   The patient had palonosetron (ALOXI) injection 0.25 mg, 0.25 mg, Intravenous,  Once, 27 of 27 cycles Administration: 0.25 mg (06/17/2018), 0.25 mg (06/23/2018), 0.25 mg (06/30/2018), 0.25 mg (07/07/2018), 0.25 mg (07/14/2018), 0.25 mg (07/22/2018), 0.25 mg (07/29/2018), 0.25 mg (08/06/2018), 0.25 mg (08/13/2018), 0.25 mg (08/20/2018), 0.25 mg (08/27/2018), 0.25 mg (09/06/2018), 0.25 mg (09/13/2018), 0.25 mg (09/20/2018), 0.25 mg (09/27/2018), 0.25 mg (10/04/2018), 0.25 mg (10/11/2018), 0.25 mg (10/18/2018), 0.25 mg (10/25/2018), 0.25 mg (11/01/2018), 0.25 mg (11/08/2018), 0.25 mg (11/15/2018), 0.25 mg (11/22/2018), 0.25 mg (11/29/2018), 0.25 mg (12/08/2018), 0.25 mg (12/16/2018) bortezomib SQ (VELCADE) chemo injection 2.5 mg, 1.3 mg/m2 = 2.5 mg, Subcutaneous,  Once, 34 of 38 cycles Administration: 2.5 mg (06/17/2018), 2.5 mg (06/23/2018), 2.5 mg (06/30/2018), 2.5 mg (07/07/2018), 2.5 mg (07/14/2018), 2.5 mg (07/22/2018), 2.5 mg (07/29/2018), 2.5 mg (08/06/2018), 2.5 mg (08/13/2018), 2.5 mg (08/20/2018), 2.5 mg (08/27/2018), 2.5 mg (09/06/2018), 2.5 mg (09/13/2018), 2.5 mg (09/20/2018), 2.5 mg (09/27/2018), 2.5 mg (10/04/2018), 2.5 mg (10/11/2018), 2.5 mg (10/18/2018), 2.5 mg (10/25/2018), 2.5 mg (11/01/2018), 2.5 mg (11/08/2018), 2.5 mg (11/15/2018), 2.5 mg (11/22/2018), 2.5 mg (11/29/2018), 2.5 mg (12/08/2018), 2.5 mg (12/16/2018), 2.5 mg (12/27/2018), 2.5 mg (01/03/2019), 2.5 mg (01/10/2019), 2.5 mg (01/17/2019), 2.5 mg (01/24/2019), 2.5 mg (01/31/2019), 2.5 mg (02/07/2019) cyclophosphamide (CYTOXAN) 300 mg in sodium chloride 0.9 % 250 mL chemo  infusion, 300 mg (100 % of original dose 300 mg), Intravenous,  Once, 26 of 26 cycles Dose modification: 300 mg (original dose 300 mg, Cycle 1, Reason: Change in SCr/CrCl), 500 mg (original dose 500 mg, Cycle 2, Reason: Provider Judgment), 500 mg (original dose 500 mg, Cycle 3, Reason: Change in SCr/CrCl) Administration: 300 mg (06/17/2018), 500 mg (06/23/2018), 500 mg (06/30/2018), 600 mg (07/07/2018), 600 mg (07/14/2018), 600 mg (07/22/2018), 600 mg (07/29/2018), 600 mg (08/06/2018), 600 mg (08/13/2018), 600 mg (08/20/2018), 600 mg (08/27/2018), 600 mg (09/06/2018), 600 mg (09/13/2018), 600 mg (09/20/2018), 600 mg (09/27/2018), 600 mg (10/04/2018), 600 mg (10/11/2018), 600 mg (10/18/2018), 600 mg (10/25/2018), 600 mg (11/01/2018), 600 mg (11/08/2018), 600 mg (11/15/2018), 600 mg (11/22/2018), 600 mg (11/29/2018), 600 mg (12/08/2018), 600 mg (12/16/2018)  for chemotherapy treatment.       INTERVAL HISTORY:  [name] 58 y.o. male seen for follow-up of multiple myeloma.  He is tolerating Velcade very well.  Appetite and energy levels are 50%.  Has some numbness in the balls of his toes which is stable.  Denies any burning sensation.  Denies any infections or hospitalizations.  He is taking torsemide daily.  He is also taking Veltassa.  No skin rashes reported.  No fevers, night sweats or weight loss.  Denies any nausea, vomiting or diarrhea or constipation.  REVIEW OF SYSTEMS:  Review of Systems  Neurological: Positive for numbness.  All other systems reviewed and are negative.    PAST MEDICAL/SURGICAL HISTORY:  Past Medical History:  Diagnosis Date  . Allergy   . Arthritis    neck and back  . Blood transfusion without reported diagnosis   . BPH (benign prostatic hyperplasia)   . Cancer [facility]) 2004  testicle  . Chronic kidney disease    kidney stones  . Left lumbar radiculopathy 06/12/2016  . 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 Medical history non-contributory    Pt has scattered thoughts and uncertain of past medical  history  . 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 Stones, urinary tract   . Substance abuse ([place])    prescribed oxydcodone- 10 years   Past Surgical History:  Procedure Laterality Date  . BACK SURGERY     x5  . CERVICAL DISC SURGERY     x2  . COLONOSCOPY WITH PROPOFOL N/A 08/11/2016   Dr. Gala Romney: non-bleeding internal hemorrhoids, one 4 mm hyperplastic rectal polyp, diverticulosis in entire examined colon  . POLYPECTOMY  08/11/2016   Procedure: POLYPECTOMY;  Surgeon: [name], MD;  Location: [facility] ENDO SUITE;  Service: Endoscopy;;  colon  . PORTACATH PLACEMENT Left 09/20/2018   Procedure: INSERTION PORT-A-CATH (catheter attached left subclavian);  Surgeon: Virl Cagey, MD;  Location: [facility] ORS;  Service: General;  Laterality: Left;  . testicular cancer  2004  . TONSILLECTOMY       SOCIAL HISTORY:  Social History   Socioeconomic History  . Marital status: Single    Spouse name: Not on file  . Number of children: 1  . Years of education: 35  . Highest education level: Not on file  Occupational History  . Occupation: retired    Comment: Psychologist, prison and probation services  . Occupation: disabled  Social Needs  . Financial resource strain: Not on file  . Food insecurity    Worry: Not on file    Inability: Not on file  . Transportation needs    Medical: Not on file    Non-medical: Not on file  Tobacco Use  . Smoking status: Current Every Day Smoker    Packs/day: 1.00    Years: 40.00    Pack years: 40.00    Types: Cigarettes    Start date: 03/31/1974  . Smokeless tobacco: Never Used  Substance and Sexual Activity  . Alcohol use: Yes    Comment: Occasional  . Drug use: No    Types: Cocaine    Comment: Remote hx of cocaine, quit 1989  . Sexual activity: Yes  Lifestyle  . Physical activity    Days per week: Not on file    Minutes per session: Not on file  . Stress: Not on file  Relationships  . Social Herbalist on phone: Not on file    Gets together: Not on file     Attends religious service: Not on file    Active member of club or organization: Not on file    Attends meetings of clubs or organizations: Not on file    Relationship status: Not on file  . Intimate partner violence    Fear of current or ex partner: No    Emotionally abused: No    Physically abused: No    Forced sexual activity: No  Other Topics Concern  . Not on file  Social History Narrative   [employer] for 12 years   [employer] for 78 years      Never married   One daughter      Lives alone   Retail banker   Right-handed   Occasional caffeine use          FAMILY HISTORY:  Family History  Problem Relation Age of Onset  . COPD Mother   . Heart disease Mother   .  Other Father        Never knew his father.  . Thyroid disease Sister   . Arthritis Sister   . Heart disease Sister        bypass  . Colon cancer Neg Hx     CURRENT MEDICATIONS:  Outpatient Encounter Medications as of 02/14/2019  Medication Sig  . bortezomib IV (VELCADE) 3.5 MG injection Inject 1.3 mg/m2 into the vein once a week.   . calcitRIOL (ROCALTROL) 0.5 MCG capsule Take 0.5 mcg by mouth daily.   . CYCLOPHOSPHAMIDE IV Inject into the vein once a week.  Marland Kitchen Epoetin Alfa (PROCRIT IJ) Inject as directed. Unsure of dosage- gets this once weekly  . HYDROmorphone (DILAUDID) 4 MG tablet Take 1 tablet (4 mg total) by mouth every 8 (eight) hours as needed for severe pain.  . Patiromer Sorbitex Calcium (VELTASSA PO) Take by mouth 2 (two) times a week.   . senna (SENOKOT) 8.6 MG tablet Take 1 tablet by mouth 2 (two) times daily.   . sodium bicarbonate 650 MG tablet Take 1,300 mg by mouth 2 (two) times daily.  Marland Kitchen torsemide (DEMADEX) 20 MG tablet TAKING 1 TABLET DAILY, AND 2 TABLETS ONLY IF HIS ANKLES ARE REALLY SWOLLEN  . valACYclovir (VALTREX) 500 MG tablet TAKE 1 TABLET BY MOUTH TWICE A DAY  . Misc. Devices MISC Please provide patient with rollaider.  Dx: multiple myeloma C90.0 (Patient not  taking: Reported on 02/14/2019)   No facility-administered encounter medications on file as of 02/14/2019.     ALLERGIES:  Allergies  Allergen Reactions  . Oxycodone-Acetaminophen   . Acetaminophen Nausea Only and Other (See Comments)    Elevated liver enzymes  . Cymbalta [Duloxetine Hcl] Swelling    Facial swelling  . Diclofenac Sodium Swelling       . Diclofenac Sodium Swelling  . Imodium [Loperamide] Swelling    facial  . Lyrica [Pregabalin] Swelling  . Morphine Other (See Comments)    Bradycardia   . Vioxx [Rofecoxib] Swelling  . Aleve [Naproxen] Swelling    eye     PHYSICAL EXAM:  ECOG Performance status: 1  Vitals:   02/14/19 1406  BP: 120/64  Pulse: 92  Resp: 18  Temp: (!) 97.1 F (36.2 C)  SpO2: 97%   Filed Weights   02/14/19 1406  Weight: 177 lb 9.6 oz (80.6 kg)    Physical Exam Vitals signs reviewed.  Constitutional:      Appearance: Normal appearance. He is normal weight.  Cardiovascular:     Rate and Rhythm: Normal rate and regular rhythm.     Heart sounds: Normal heart sounds.  Pulmonary:     Effort: Pulmonary effort is normal.     Breath sounds: Normal breath sounds.  Abdominal:     General: Bowel sounds are normal.     Palpations: Abdomen is soft.  Musculoskeletal: Normal range of motion.     Right lower leg: No edema.     Left lower leg: No edema.  Skin:    General: Skin is warm and dry.  Neurological:     Mental Status: He is alert and oriented to person, place, and time. Mental status is at baseline.  Psychiatric:        Mood and Affect: Mood normal.        Behavior: Behavior normal.      LABORATORY DATA:  I have reviewed the labs as listed.  CBC    Component Value Date/Time   WBC 5.6 02/14/2019  1344   RBC 3.00 (L) 02/14/2019 1344   HGB 10.6 (L) 02/14/2019 1344   HCT 33.0 (L) 02/14/2019 1344   PLT 160 02/14/2019 1344   MCV 110.0 (H) 02/14/2019 1344   MCH 35.3 (H) 02/14/2019 1344   MCHC 32.1 02/14/2019 1344   RDW  15.5 02/14/2019 1344   LYMPHSABS 1.1 02/14/2019 1344   MONOABS 0.6 02/14/2019 1344   EOSABS 0.4 02/14/2019 1344   BASOSABS 0.0 02/14/2019 1344   CMP Latest Ref Rng & Units 02/14/2019 02/07/2019 01/31/2019  Glucose 70 - 99 mg/dL 108(H) 90 89  BUN 6 - 20 mg/dL 54(H) 50(H) 44(H)  Creatinine 0.61 - 1.24 mg/dL 4.67(H) 4.70(H) 4.39(H)  Sodium 135 - 145 mmol/L 136 137 141  Potassium 3.5 - 5.1 mmol/L 4.4 4.9 5.1  Chloride 98 - 111 mmol/L 101 103 105  CO2 22 - 32 mmol/L _0 Calcium 8.9 - 10.3 mg/dL 9.0 9.0 8.9  Total Protein 6.5 - 8.1 g/dL 6.7 7.0 7.2  Total Bilirubin 0.3 - 1.2 mg/dL 0.6 0.6 0.6  Alkaline Phos 38 - 126 U/L 79 83 72  AST 15 - 41 U/L 13(L) 14(L) 16  ALT 0 - 44 U/L _1 I have personally reviewed his scans and discussed with him.     ASSESSMENT & PLAN:   Kappa light chain myeloma ([place]) 1.  Kappa light chain myeloma, high risk, stage III: -Presentation with acute renal failure and hypercalcemia.  M spike was 0.4 g.  Immunofixation did not show any immunoglobulin heavy chain.  Kappa free light chains was 12,085.  Free light chain ratio was 1981 and LDH of 224. -Bone marrow biopsy on 06/16/2018 shows 87% atypical plasma cells.  Cytogenetics shows complex chromosome abnormalities.  Gain of 1 q. was seen.  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 - MRI of the brain for left-sided facial numbness was negative. - He was evaluated by Dr. Aris Lot at [facility] for transplant on 12/07/2018. -Myeloma panel on 01/17/2019 shows kappa light chains of 95, free light chain ratio improved to 3.04. -We held cyclophosphamide since 12/16/2018. -He is continuing dexamethasone 20 mg weekly.  He is tolerating Velcade reasonably well.  He had a conversation with Dr.Lambird at [facility] regarding transplantation.  I have also called and talked to Dr. Aris Lot. -He decided to wait rather than proceeding with the transplant at this  time. -We will continue Velcade and dexamethasone.  We have sent myeloma panel from today.  We will see him back in 3 weeks for follow-up.  2.  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 -He will continue Retacrit 20,000 units weekly.  3.  Hyperkalemia: -Potassium is 4.4.  We will continue Veltassa.  4.  ID prophylaxis: -he had skin rash with acyclovir.  He will continue Valtrex 500 mg twice daily.  5.  Low back pain: - He will continue Dilaudid 4 mg every 8 hours which is controlling his pain.  6.  Lower extremity edema: -As his creatinine has gone up to 4.7, I have recommended him to try taking torsemide 20 mg every other day.  If the swelling comes back, he will continue every day.  Total time spent is 25 minutes with more than 50% of the time spent face-to-face discussing treatment plan, counseling and coordination of care.    Orders placed this encounter:  No orders of the defined types were placed  in this encounter.     Brandon Jack, MD [place] [phone]

## 2019-02-15 LAB — PROTEIN ELECTROPHORESIS, SERUM
A/G Ratio: 1.4 (ref 0.7–1.7)
Albumin ELP: 3.6 g/dL (ref 2.9–4.4)
Alpha-1-Globulin: 0.2 g/dL (ref 0.0–0.4)
Alpha-2-Globulin: 0.7 g/dL (ref 0.4–1.0)
Beta Globulin: 0.8 g/dL (ref 0.7–1.3)
Gamma Globulin: 0.9 g/dL (ref 0.4–1.8)
Globulin, Total: 2.6 g/dL (ref 2.2–3.9)
Total Protein ELP: 6.2 g/dL (ref 6.0–8.5)

## 2019-02-15 LAB — KAPPA/LAMBDA LIGHT CHAINS
Kappa free light chain: 107.7 mg/L — ABNORMAL HIGH (ref 3.3–19.4)
Kappa, lambda light chain ratio: 2.64 — ABNORMAL HIGH (ref 0.26–1.65)
Lambda free light chains: 40.8 mg/L — ABNORMAL HIGH (ref 5.7–26.3)

## 2019-02-21 ENCOUNTER — Inpatient Hospital Stay (HOSPITAL_COMMUNITY): Payer: Medicare Other

## 2019-02-21 ENCOUNTER — Other Ambulatory Visit: Payer: Self-pay

## 2019-02-21 VITALS — BP 121/71 | HR 87 | Temp 97.7°F | Resp 16 | Wt 179.0 lb

## 2019-02-21 DIAGNOSIS — C9000 Multiple myeloma not having achieved remission: Secondary | ICD-10-CM

## 2019-02-21 DIAGNOSIS — Z5112 Encounter for antineoplastic immunotherapy: Secondary | ICD-10-CM | POA: Diagnosis not present

## 2019-02-21 LAB — COMPREHENSIVE METABOLIC PANEL
ALT: 10 U/L (ref 0–44)
AST: 16 U/L (ref 15–41)
Albumin: 4 g/dL (ref 3.5–5.0)
Alkaline Phosphatase: 75 U/L (ref 38–126)
Anion gap: 11 (ref 5–15)
BUN: 41 mg/dL — ABNORMAL HIGH (ref 6–20)
CO2: 27 mmol/L (ref 22–32)
Calcium: 9.1 mg/dL (ref 8.9–10.3)
Chloride: 103 mmol/L (ref 98–111)
Creatinine, Ser: 4.52 mg/dL — ABNORMAL HIGH (ref 0.61–1.24)
GFR calc Af Amer: 15 mL/min — ABNORMAL LOW (ref >60)
GFR calc non Af Amer: 13 mL/min — ABNORMAL LOW (ref >60)
Glucose, Bld: 111 mg/dL — ABNORMAL HIGH (ref 70–99)
Potassium: 4.4 mmol/L (ref 3.5–5.1)
Sodium: 141 mmol/L (ref 135–145)
Total Bilirubin: 0.6 mg/dL (ref 0.3–1.2)
Total Protein: 6.8 g/dL (ref 6.5–8.1)

## 2019-02-21 LAB — CBC WITH DIFFERENTIAL/PLATELET
Abs Immature Granulocytes: 0.01 10*3/uL (ref 0.00–0.07)
Basophils Absolute: 0 10*3/uL (ref 0.0–0.1)
Basophils Relative: 1 %
Eosinophils Absolute: 0.4 10*3/uL (ref 0.0–0.5)
Eosinophils Relative: 7 %
HCT: 34.1 % — ABNORMAL LOW (ref 39.0–52.0)
Hemoglobin: 11.1 g/dL — ABNORMAL LOW (ref 13.0–17.0)
Immature Granulocytes: 0 %
Lymphocytes Relative: 19 %
Lymphs Abs: 1.1 10*3/uL (ref 0.7–4.0)
MCH: 36.2 pg — ABNORMAL HIGH (ref 26.0–34.0)
MCHC: 32.6 g/dL (ref 30.0–36.0)
MCV: 111.1 fL — ABNORMAL HIGH (ref 80.0–100.0)
Monocytes Absolute: 0.5 10*3/uL (ref 0.1–1.0)
Monocytes Relative: 10 %
Neutro Abs: 3.6 10*3/uL (ref 1.7–7.7)
Neutrophils Relative %: 63 %
Platelets: 207 10*3/uL (ref 150–400)
RBC: 3.07 MIL/uL — ABNORMAL LOW (ref 4.22–5.81)
RDW: 15.9 % — ABNORMAL HIGH (ref 11.5–15.5)
WBC: 5.6 10*3/uL (ref 4.0–10.5)
nRBC: 0 % (ref 0.0–0.2)

## 2019-02-21 LAB — PHOSPHORUS: Phosphorus: 4.6 mg/dL (ref 2.5–4.6)

## 2019-02-21 LAB — MAGNESIUM: Magnesium: 2.2 mg/dL (ref 1.7–2.4)

## 2019-02-21 MED ORDER — DEXAMETHASONE 4 MG PO TABS
20.0000 mg | ORAL_TABLET | Freq: Once | ORAL | Status: AC
  Administered 2019-02-21: 20 mg via ORAL
  Filled 2019-02-21: qty 5

## 2019-02-21 MED ORDER — ONDANSETRON HCL 4 MG PO TABS
8.0000 mg | ORAL_TABLET | Freq: Once | ORAL | Status: AC
  Administered 2019-02-21: 8 mg via ORAL
  Filled 2019-02-21: qty 2

## 2019-02-21 MED ORDER — BORTEZOMIB CHEMO SQ INJECTION 3.5 MG (2.5MG/ML)
1.3000 mg/m2 | Freq: Once | INTRAMUSCULAR | Status: AC
  Administered 2019-02-21: 2.5 mg via SUBCUTANEOUS
  Filled 2019-02-21: qty 1

## 2019-02-21 NOTE — Patient Instructions (Signed)
[facility] Discharge Instructions for Patients Receiving Chemotherapy   Beginning January 23rd 2017 lab work for the Cancer Center will be done in the  Main lab at [place] on 1st floor. If you have a lab appointment with the Cancer Center please come in thru the  Main Entrance and check in at the main information desk   Today you received the following chemotherapy agents Velcade  To help prevent nausea and vomiting after your treatment, we encourage you to take your nausea medication    If you develop nausea and vomiting, or diarrhea that is not controlled by your medication, call the clinic.  The clinic phone number is [phone]. Office hours are Monday-Friday 8:30am-5:00pm.  BELOW ARE SYMPTOMS THAT SHOULD BE REPORTED IMMEDIATELY:  *FEVER GREATER THAN 101.0 F  *CHILLS WITH OR WITHOUT FEVER  NAUSEA AND VOMITING THAT IS NOT CONTROLLED WITH YOUR NAUSEA MEDICATION  *UNUSUAL SHORTNESS OF BREATH  *UNUSUAL BRUISING OR BLEEDING  TENDERNESS IN MOUTH AND THROAT WITH OR WITHOUT PRESENCE OF ULCERS  *URINARY PROBLEMS  *BOWEL PROBLEMS  UNUSUAL RASH Items with * indicate a potential emergency and should be followed up as soon as possible. If you have an emergency after office hours please contact your primary care physician or go to the nearest emergency department.  Please call the clinic during office hours if you have any questions or concerns.   You may also contact the Patient Navigator at [phone] should you have any questions or need assistance in obtaining follow up care.      Resources For Cancer Patients and their Caregivers ? American Cancer Society: Can assist with transportation, wigs, general needs, runs Look Good Feel Better.        1-888-227-6333 ? Cancer Care: Provides financial assistance, online support groups, medication/co-pay assistance.  1-800-813-HOPE (4673) ? [facility] Assists [place] cancer  patients and their families through emotional , educational and financial support.  336-427-4357 ? [place] DSS Where to apply for food stamps, Medicaid and utility assistance. 336-342-1394 ? RCATS: Transportation to medical appointments. 336-347-2287 ? Social Security Administration: May apply for disability if have a Stage IV cancer. 336-342-7796 1-800-772-1213 ? [place] Aging, Disability and Transit Services: Assists with nutrition, care and transit needs. 336-349-2343          

## 2019-02-21 NOTE — Progress Notes (Signed)
[name] presents today for Velcade injection. Lab work reviewed, including Cr. 4.5. Pts baseline and history of CKD. Per MD, ok to proceed with treatment today. Retacrit will be held per parameters, hgb 11.1.  [name] tolerated Velcade injection without incident or complaint. VSS. Discharged self ambulatory in satisfactory condition.

## 2019-02-28 ENCOUNTER — Inpatient Hospital Stay (HOSPITAL_COMMUNITY): Payer: Medicare Other

## 2019-02-28 ENCOUNTER — Other Ambulatory Visit: Payer: Self-pay

## 2019-02-28 VITALS — BP 109/86 | HR 85 | Temp 97.1°F | Resp 18 | Wt 175.0 lb

## 2019-02-28 DIAGNOSIS — N185 Chronic kidney disease, stage 5: Secondary | ICD-10-CM

## 2019-02-28 DIAGNOSIS — C9000 Multiple myeloma not having achieved remission: Secondary | ICD-10-CM

## 2019-02-28 DIAGNOSIS — Z5112 Encounter for antineoplastic immunotherapy: Secondary | ICD-10-CM | POA: Diagnosis not present

## 2019-02-28 LAB — LACTATE DEHYDROGENASE: LDH: 187 U/L (ref 98–192)

## 2019-02-28 LAB — CBC WITH DIFFERENTIAL/PLATELET
Abs Immature Granulocytes: 0.02 10*3/uL (ref 0.00–0.07)
Basophils Absolute: 0 10*3/uL (ref 0.0–0.1)
Basophils Relative: 1 %
Eosinophils Absolute: 0.3 10*3/uL (ref 0.0–0.5)
Eosinophils Relative: 5 %
HCT: 36.3 % — ABNORMAL LOW (ref 39.0–52.0)
Hemoglobin: 11.6 g/dL — ABNORMAL LOW (ref 13.0–17.0)
Immature Granulocytes: 0 %
Lymphocytes Relative: 15 %
Lymphs Abs: 0.9 10*3/uL (ref 0.7–4.0)
MCH: 35.5 pg — ABNORMAL HIGH (ref 26.0–34.0)
MCHC: 32 g/dL (ref 30.0–36.0)
MCV: 111 fL — ABNORMAL HIGH (ref 80.0–100.0)
Monocytes Absolute: 0.6 10*3/uL (ref 0.1–1.0)
Monocytes Relative: 9 %
Neutro Abs: 4.3 10*3/uL (ref 1.7–7.7)
Neutrophils Relative %: 70 %
Platelets: 205 10*3/uL (ref 150–400)
RBC: 3.27 MIL/uL — ABNORMAL LOW (ref 4.22–5.81)
RDW: 15.7 % — ABNORMAL HIGH (ref 11.5–15.5)
WBC: 6.2 10*3/uL (ref 4.0–10.5)
nRBC: 0 % (ref 0.0–0.2)

## 2019-02-28 LAB — COMPREHENSIVE METABOLIC PANEL
ALT: 10 U/L (ref 0–44)
AST: 15 U/L (ref 15–41)
Albumin: 4.3 g/dL (ref 3.5–5.0)
Alkaline Phosphatase: 82 U/L (ref 38–126)
Anion gap: 10 (ref 5–15)
BUN: 61 mg/dL — ABNORMAL HIGH (ref 6–20)
CO2: 23 mmol/L (ref 22–32)
Calcium: 9.7 mg/dL (ref 8.9–10.3)
Chloride: 105 mmol/L (ref 98–111)
Creatinine, Ser: 4.9 mg/dL — ABNORMAL HIGH (ref 0.61–1.24)
GFR calc Af Amer: 14 mL/min — ABNORMAL LOW (ref >60)
GFR calc non Af Amer: 12 mL/min — ABNORMAL LOW (ref >60)
Glucose, Bld: 150 mg/dL — ABNORMAL HIGH (ref 70–99)
Potassium: 4.6 mmol/L (ref 3.5–5.1)
Sodium: 138 mmol/L (ref 135–145)
Total Bilirubin: 1 mg/dL (ref 0.3–1.2)
Total Protein: 7.3 g/dL (ref 6.5–8.1)

## 2019-02-28 LAB — MAGNESIUM: Magnesium: 2.3 mg/dL (ref 1.7–2.4)

## 2019-02-28 LAB — PHOSPHORUS: Phosphorus: 5 mg/dL — ABNORMAL HIGH (ref 2.5–4.6)

## 2019-02-28 MED ORDER — ONDANSETRON HCL 4 MG PO TABS
8.0000 mg | ORAL_TABLET | Freq: Once | ORAL | Status: AC
  Administered 2019-02-28: 8 mg via ORAL
  Filled 2019-02-28: qty 2

## 2019-02-28 MED ORDER — DEXAMETHASONE 4 MG PO TABS
20.0000 mg | ORAL_TABLET | Freq: Once | ORAL | Status: AC
  Administered 2019-02-28: 20 mg via ORAL
  Filled 2019-02-28: qty 5

## 2019-02-28 MED ORDER — BORTEZOMIB CHEMO SQ INJECTION 3.5 MG (2.5MG/ML)
1.3000 mg/m2 | Freq: Once | INTRAMUSCULAR | Status: AC
  Administered 2019-02-28: 2.5 mg via SUBCUTANEOUS
  Filled 2019-02-28: qty 1

## 2019-02-28 NOTE — Progress Notes (Signed)
Labs reviewed by MD. BUN 61/Creatinine 4.90. proceed with treatment per MD.   Late entry   Treatment given per orders. Patient tolerated it well without problems. Vitals stable and discharged home from clinic ambulatory. Follow up as scheduled.

## 2019-03-01 LAB — PROTEIN ELECTROPHORESIS, SERUM
A/G Ratio: 1.5 (ref 0.7–1.7)
Albumin ELP: 4 g/dL (ref 2.9–4.4)
Alpha-1-Globulin: 0.2 g/dL (ref 0.0–0.4)
Alpha-2-Globulin: 0.8 g/dL (ref 0.4–1.0)
Beta Globulin: 1 g/dL (ref 0.7–1.3)
Gamma Globulin: 0.8 g/dL (ref 0.4–1.8)
Globulin, Total: 2.7 g/dL (ref 2.2–3.9)
Total Protein ELP: 6.7 g/dL (ref 6.0–8.5)

## 2019-03-01 LAB — KAPPA/LAMBDA LIGHT CHAINS
Kappa free light chain: 117.1 mg/L — ABNORMAL HIGH (ref 3.3–19.4)
Kappa, lambda light chain ratio: 3.39 — ABNORMAL HIGH (ref 0.26–1.65)
Lambda free light chains: 34.5 mg/L — ABNORMAL HIGH (ref 5.7–26.3)

## 2019-03-02 ENCOUNTER — Other Ambulatory Visit (HOSPITAL_COMMUNITY): Payer: Self-pay | Admitting: *Deleted

## 2019-03-03 MED ORDER — HYDROMORPHONE HCL 4 MG PO TABS: 4.0000 mg | ORAL_TABLET | Freq: Three times a day (TID) | ORAL | 0 refills | Status: DC | PRN

## 2019-03-07 ENCOUNTER — Encounter (HOSPITAL_COMMUNITY): Payer: Self-pay | Admitting: Hematology

## 2019-03-07 ENCOUNTER — Inpatient Hospital Stay (HOSPITAL_COMMUNITY): Payer: Medicare Other

## 2019-03-07 ENCOUNTER — Inpatient Hospital Stay (HOSPITAL_BASED_OUTPATIENT_CLINIC_OR_DEPARTMENT_OTHER): Payer: Medicare Other | Admitting: Hematology

## 2019-03-07 ENCOUNTER — Other Ambulatory Visit: Payer: Self-pay

## 2019-03-07 ENCOUNTER — Inpatient Hospital Stay (HOSPITAL_COMMUNITY): Payer: Medicare Other | Attending: Hematology

## 2019-03-07 VITALS — BP 130/66 | HR 79 | Temp 97.1°F | Resp 16 | Wt 180.4 lb

## 2019-03-07 DIAGNOSIS — M545 Low back pain: Secondary | ICD-10-CM | POA: Insufficient documentation

## 2019-03-07 DIAGNOSIS — D631 Anemia in chronic kidney disease: Secondary | ICD-10-CM | POA: Diagnosis not present

## 2019-03-07 DIAGNOSIS — Z5112 Encounter for antineoplastic immunotherapy: Secondary | ICD-10-CM | POA: Insufficient documentation

## 2019-03-07 DIAGNOSIS — C9000 Multiple myeloma not having achieved remission: Secondary | ICD-10-CM

## 2019-03-07 DIAGNOSIS — E876 Hypokalemia: Secondary | ICD-10-CM | POA: Diagnosis not present

## 2019-03-07 DIAGNOSIS — N189 Chronic kidney disease, unspecified: Secondary | ICD-10-CM | POA: Insufficient documentation

## 2019-03-07 LAB — COMPREHENSIVE METABOLIC PANEL
ALT: 8 U/L (ref 0–44)
AST: 13 U/L — ABNORMAL LOW (ref 15–41)
Albumin: 4.2 g/dL (ref 3.5–5.0)
Alkaline Phosphatase: 80 U/L (ref 38–126)
Anion gap: 12 (ref 5–15)
BUN: 59 mg/dL — ABNORMAL HIGH (ref 6–20)
CO2: 26 mmol/L (ref 22–32)
Calcium: 9.4 mg/dL (ref 8.9–10.3)
Chloride: 102 mmol/L (ref 98–111)
Creatinine, Ser: 5.15 mg/dL — ABNORMAL HIGH (ref 0.61–1.24)
GFR calc Af Amer: 13 mL/min — ABNORMAL LOW (ref >60)
GFR calc non Af Amer: 11 mL/min — ABNORMAL LOW (ref >60)
Glucose, Bld: 98 mg/dL (ref 70–99)
Potassium: 4.9 mmol/L (ref 3.5–5.1)
Sodium: 140 mmol/L (ref 135–145)
Total Bilirubin: 0.4 mg/dL (ref 0.3–1.2)
Total Protein: 7 g/dL (ref 6.5–8.1)

## 2019-03-07 LAB — PHOSPHORUS: Phosphorus: 5.8 mg/dL — ABNORMAL HIGH (ref 2.5–4.6)

## 2019-03-07 LAB — CBC WITH DIFFERENTIAL/PLATELET
Abs Immature Granulocytes: 0.02 10*3/uL (ref 0.00–0.07)
Basophils Absolute: 0 10*3/uL (ref 0.0–0.1)
Basophils Relative: 1 %
Eosinophils Absolute: 0.6 10*3/uL — ABNORMAL HIGH (ref 0.0–0.5)
Eosinophils Relative: 9 %
HCT: 35.1 % — ABNORMAL LOW (ref 39.0–52.0)
Hemoglobin: 11.2 g/dL — ABNORMAL LOW (ref 13.0–17.0)
Immature Granulocytes: 0 %
Lymphocytes Relative: 20 %
Lymphs Abs: 1.3 10*3/uL (ref 0.7–4.0)
MCH: 35.6 pg — ABNORMAL HIGH (ref 26.0–34.0)
MCHC: 31.9 g/dL (ref 30.0–36.0)
MCV: 111.4 fL — ABNORMAL HIGH (ref 80.0–100.0)
Monocytes Absolute: 0.9 10*3/uL (ref 0.1–1.0)
Monocytes Relative: 14 %
Neutro Abs: 3.6 10*3/uL (ref 1.7–7.7)
Neutrophils Relative %: 56 %
Platelets: 173 10*3/uL (ref 150–400)
RBC: 3.15 MIL/uL — ABNORMAL LOW (ref 4.22–5.81)
RDW: 15.1 % (ref 11.5–15.5)
WBC: 6.4 10*3/uL (ref 4.0–10.5)
nRBC: 0 % (ref 0.0–0.2)

## 2019-03-07 LAB — MAGNESIUM: Magnesium: 2.4 mg/dL (ref 1.7–2.4)

## 2019-03-07 MED ORDER — ONDANSETRON HCL 4 MG PO TABS
8.0000 mg | ORAL_TABLET | Freq: Once | ORAL | Status: AC
  Administered 2019-03-07: 8 mg via ORAL
  Filled 2019-03-07: qty 2

## 2019-03-07 MED ORDER — HEPARIN SOD (PORK) LOCK FLUSH 100 UNIT/ML IV SOLN
500.0000 [IU] | Freq: Once | INTRAVENOUS | Status: AC | PRN
  Administered 2019-03-07: 500 [IU]

## 2019-03-07 MED ORDER — SODIUM CHLORIDE 0.9% FLUSH
10.0000 mL | INTRAVENOUS | Status: DC | PRN
  Administered 2019-03-07: 10 mL
  Filled 2019-03-07: qty 10

## 2019-03-07 MED ORDER — DEXAMETHASONE 4 MG PO TABS
40.0000 mg | ORAL_TABLET | Freq: Once | ORAL | Status: AC
  Administered 2019-03-07: 40 mg via ORAL

## 2019-03-07 MED ORDER — BORTEZOMIB CHEMO SQ INJECTION 3.5 MG (2.5MG/ML)
1.3000 mg/m2 | Freq: Once | INTRAMUSCULAR | Status: AC
  Administered 2019-03-07: 2.5 mg via SUBCUTANEOUS
  Filled 2019-03-07: qty 1

## 2019-03-07 MED ORDER — DEXAMETHASONE 4 MG PO TABS
20.0000 mg | ORAL_TABLET | Freq: Once | ORAL | Status: DC
  Filled 2019-03-07: qty 5

## 2019-03-07 NOTE — Assessment & Plan Note (Addendum)
1.  Kappa light chain myeloma, high risk, stage III: -Presentation with acute renal failure and hypercalcemia.  M spike was 0.4 g.  Immunofixation did not show any immunoglobulin heavy chain.  Kappa free light chains was 12,085.  Free light chain ratio was 1981 and LDH of 224. -Bone marrow biopsy on 06/16/2018 shows 87% atypical plasma cells.  Cytogenetics shows complex chromosome abnormalities.  Gain of 1 q. was seen.  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 - MRI of the brain for left-sided facial numbness was negative. - He was evaluated by Dr. Lambird at [facility] for transplant on 12/07/2018. -Myeloma panel on 01/17/2019 shows kappa light chains of 95, free light chain ratio improved to 3.04. -We held cyclophosphamide since 12/16/2018. -He is continuing dexamethasone 20 mg weekly.  He is tolerating Velcade reasonably well.  He had a conversation with Dr.Lambird at [facility] regarding transplantation.  I have also called and talked to Dr. Lambird. -At this time he decided to wait rather than proceeding with the transplant. -I reviewed myeloma panel from 02/28/2019.  M spike is negative.  Kappa light chains have gone up to 117, previously 107.  Lambda light chains of 34.5, previously 40.8.  Ratio is 3.39, up from 2.64. -As his kappa light chains have gone up, and his creatinine trending up, I have recommended increasing dexamethasone weekly to 40 mg.  He is currently taking 20 mg with Velcade. -I plan to repeat his myeloma labs in 3 weeks and see him back in 4 weeks for follow-up. -In the last 2 to 3 days, he developed itching rash below the clavicles and in the upper back.  He denies any pain.  On examination there is a scaling plaque below the left clavicle.  We will make a referral to dermatology.  2.  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 -He will continue Retacrit 20,000 units weekly.  3.  Hyperkalemia: -Potassium  is 4.9.  We will continue Veltassa.  4.  ID prophylaxis: -he had skin rash with acyclovir.  He will continue Valtrex 500 mg twice daily.  5.  Low back pain: - He will continue Dilaudid 4 mg every 8 hours which is controlling his pain.  6.  Lower extremity edema: -As his creatinine was going up, we cut back on torsemide to 20 mg every other day.  Leg swelling has not worsened. 

## 2019-03-07 NOTE — Patient Instructions (Signed)
[facility] Discharge Instructions for Patients Receiving Chemotherapy   Beginning January 23rd 2017 lab work for the [facility] will be done in the  Main lab at [facility] on 1st floor. If you have a lab appointment with the [place] please come in thru the  Main Entrance and check in at the main information desk   Today you received the following chemotherapy agents Velcade injection as well as portacath flush. Follow-up as scheduled. Call clinic for any questions or concerns  To help prevent nausea and vomiting after your treatment, we encourage you to take your nausea medication   If you develop nausea and vomiting, or diarrhea that is not controlled by your medication, call the clinic.  The clinic phone number is [phone]. Office hours are Monday-Friday 8:30am-5:00pm.  BELOW ARE SYMPTOMS THAT SHOULD BE REPORTED IMMEDIATELY:  *FEVER GREATER THAN 101.0 F  *CHILLS WITH OR WITHOUT FEVER  NAUSEA AND VOMITING THAT IS NOT CONTROLLED WITH YOUR NAUSEA MEDICATION  *UNUSUAL SHORTNESS OF BREATH  *UNUSUAL BRUISING OR BLEEDING  TENDERNESS IN MOUTH AND THROAT WITH OR WITHOUT PRESENCE OF ULCERS  *URINARY PROBLEMS  *BOWEL PROBLEMS  UNUSUAL RASH Items with * indicate a potential emergency and should be followed up as soon as possible. If you have an emergency after office hours please contact your primary care physician or go to the nearest emergency department.  Please call the clinic during office hours if you have any questions or concerns.   You may also contact the Patient Navigator at [phone] should you have any questions or need assistance in obtaining follow up care.      Resources For Cancer Patients and their Caregivers ? American Cancer Society: Can assist with transportation, wigs, general needs, runs Look Good Feel Better.        3056438113 ? Cancer Care: Provides financial assistance, online support groups, medication/co-pay  assistance.  1-800-813-HOPE (515)146-6443) ? [facility] Assists [place] cancer patients and their families through emotional , educational and financial support.  805-263-3756 ? [place] DSS Where to apply for food stamps, Medicaid and utility assistance. 339-071-5824 ? RCATS: Transportation to medical appointments. 709-234-6678 ? Social Security Administration: May apply for disability if have a Stage IV cancer. 513-039-0765 515-220-8423 ? [employer], Disability and Transit Services: Assists with nutrition, care and transit needs. 937-615-2136

## 2019-03-07 NOTE — Patient Instructions (Signed)
[facility] at [facility] Discharge Instructions  You were seen today by Dr. Delton Coombes. He went over your recent lab results. Continue weekly labs and injections. He will see you back in 4 weeks for labs and follow up.   Thank you for choosing [facility] at [facility] to provide your oncology and hematology care.  To afford each patient quality time with our provider, please arrive at least 15 minutes before your scheduled appointment time.   If you have a lab appointment with the [place] [facility] please come in thru the  Main Entrance and check in at the main information desk  You need to re-schedule your appointment should you arrive 10 or more minutes late.  We strive to give you quality time with our providers, and arriving late affects you and other patients whose appointments are after yours.  Also, if you no show three or more times for appointments you may be dismissed from the clinic at the providers discretion.     Again, thank you for choosing [facility].  Our hope is that these requests will decrease the amount of time that you wait before being seen by our physicians.       _____________________________________________________________  Should you have questions after your visit to [facility], please contact our office at [phone] between the hours of 8:00 a.m. and 4:30 p.m.  Voicemails left after 4:00 p.m. will not be returned until the following business day.  For prescription refill requests, have your pharmacy contact our office and allow 72 hours.    Cancer Center Support Programs:   > Cancer Support Group  2nd Tuesday of the month 1pm-2pm, Journey Room

## 2019-03-07 NOTE — Progress Notes (Signed)
1530 Labs reviewed with and pt seen by Dr. Delton Coombes and pt approved for Velcade injection today with increase in Decadron PO from 20 mg to 40 mg for today's dose only per MD                  [name] tolerated Velcade injection and portacath flush well without complaints or incident. Hgb 11.2 today therefore Retacrit injection held per parameters. Pt discharged self ambulatory in satisfactory condition

## 2019-03-07 NOTE — Progress Notes (Signed)
[address], [place] [address]   CLINIC:  Medical Oncology/Hematology  PCP:  Rosita Fire, MD [place] [address] [phone]   REASON FOR VISIT: Follow-up for Multiple myeloma    BRIEF ONCOLOGIC HISTORY:  Oncology History  Kappa light chain myeloma ([place])  06/16/2018 Initial Diagnosis   Kappa light chain myeloma ([place])   06/17/2018 -  Chemotherapy   The patient had palonosetron (ALOXI) injection 0.25 mg, 0.25 mg, Intravenous,  Once, 27 of 27 cycles Administration: 0.25 mg (06/17/2018), 0.25 mg (06/23/2018), 0.25 mg (06/30/2018), 0.25 mg (07/07/2018), 0.25 mg (07/14/2018), 0.25 mg (07/22/2018), 0.25 mg (07/29/2018), 0.25 mg (08/06/2018), 0.25 mg (08/13/2018), 0.25 mg (08/20/2018), 0.25 mg (08/27/2018), 0.25 mg (09/06/2018), 0.25 mg (09/13/2018), 0.25 mg (09/20/2018), 0.25 mg (09/27/2018), 0.25 mg (10/04/2018), 0.25 mg (10/11/2018), 0.25 mg (10/18/2018), 0.25 mg (10/25/2018), 0.25 mg (11/01/2018), 0.25 mg (11/08/2018), 0.25 mg (11/15/2018), 0.25 mg (11/22/2018), 0.25 mg (11/29/2018), 0.25 mg (12/08/2018), 0.25 mg (12/16/2018) bortezomib SQ (VELCADE) chemo injection 2.5 mg, 1.3 mg/m2 = 2.5 mg, Subcutaneous,  Once, 37 of 38 cycles Administration: 2.5 mg (06/17/2018), 2.5 mg (06/23/2018), 2.5 mg (06/30/2018), 2.5 mg (07/07/2018), 2.5 mg (07/14/2018), 2.5 mg (07/22/2018), 2.5 mg (07/29/2018), 2.5 mg (08/06/2018), 2.5 mg (08/13/2018), 2.5 mg (08/20/2018), 2.5 mg (08/27/2018), 2.5 mg (09/06/2018), 2.5 mg (09/13/2018), 2.5 mg (09/20/2018), 2.5 mg (09/27/2018), 2.5 mg (10/04/2018), 2.5 mg (10/11/2018), 2.5 mg (10/18/2018), 2.5 mg (10/25/2018), 2.5 mg (11/01/2018), 2.5 mg (11/08/2018), 2.5 mg (11/15/2018), 2.5 mg (11/22/2018), 2.5 mg (11/29/2018), 2.5 mg (12/08/2018), 2.5 mg (12/16/2018), 2.5 mg (12/27/2018), 2.5 mg (01/03/2019), 2.5 mg (01/10/2019), 2.5 mg (01/17/2019), 2.5 mg (01/24/2019), 2.5 mg (01/31/2019), 2.5 mg (02/07/2019), 2.5 mg (02/14/2019), 2.5 mg (02/21/2019), 2.5 mg (02/28/2019), 2.5 mg  (03/07/2019) cyclophosphamide (CYTOXAN) 300 mg in sodium chloride 0.9 % 250 mL chemo infusion, 300 mg (100 % of original dose 300 mg), Intravenous,  Once, 26 of 26 cycles Dose modification: 300 mg (original dose 300 mg, Cycle 1, Reason: Change in SCr/CrCl), 500 mg (original dose 500 mg, Cycle 2, Reason: Provider Judgment), 500 mg (original dose 500 mg, Cycle 3, Reason: Change in SCr/CrCl) Administration: 300 mg (06/17/2018), 500 mg (06/23/2018), 500 mg (06/30/2018), 600 mg (07/07/2018), 600 mg (07/14/2018), 600 mg (07/22/2018), 600 mg (07/29/2018), 600 mg (08/06/2018), 600 mg (08/13/2018), 600 mg (08/20/2018), 600 mg (08/27/2018), 600 mg (09/06/2018), 600 mg (09/13/2018), 600 mg (09/20/2018), 600 mg (09/27/2018), 600 mg (10/04/2018), 600 mg (10/11/2018), 600 mg (10/18/2018), 600 mg (10/25/2018), 600 mg (11/01/2018), 600 mg (11/08/2018), 600 mg (11/15/2018), 600 mg (11/22/2018), 600 mg (11/29/2018), 600 mg (12/08/2018), 600 mg (12/16/2018)  for chemotherapy treatment.       INTERVAL HISTORY:  Mr. Purtee 58 y.o. male seen for follow-up of multiple myeloma.  He is tolerating Velcade very well.  He reportedly developed itching rash in the front of his chest and as well as upper back.  He correlates this rash with decreasing the dose of Lasix.  Numbness in the bottom of the feet has been stable.  Appetite is 100%.  Energy levels are 50%.  Leg swellings have not gotten worse.  No fevers or night sweats.  REVIEW OF SYSTEMS:  Review of Systems  Neurological: Positive for numbness.  All other systems reviewed and are negative.    PAST MEDICAL/SURGICAL HISTORY:  Past Medical History:  Diagnosis Date  . Allergy   . Arthritis    neck and back  . Blood transfusion without reported diagnosis   . BPH (benign  prostatic hyperplasia)   . Cancer [facility]) 2004   testicle  . Chronic kidney disease    kidney stones  . Left lumbar radiculopathy 06/12/2016  . 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 Medical history non-contributory    Pt has scattered  thoughts and uncertain of past medical history  . 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 Stones, urinary tract   . Substance abuse ([place])    prescribed oxydcodone- 10 years   Past Surgical History:  Procedure Laterality Date  . BACK SURGERY     x5  . CERVICAL DISC SURGERY     x2  . COLONOSCOPY WITH PROPOFOL N/A 08/11/2016   Dr. Gala Romney: non-bleeding internal hemorrhoids, one 4 mm hyperplastic rectal polyp, diverticulosis in entire examined colon  . POLYPECTOMY  08/11/2016   Procedure: POLYPECTOMY;  Surgeon: [name], MD;  Location: [facility] ENDO SUITE;  Service: Endoscopy;;  colon  . PORTACATH PLACEMENT Left 09/20/2018   Procedure: INSERTION PORT-A-CATH (catheter attached left subclavian);  Surgeon: Virl Cagey, MD;  Location: [facility] ORS;  Service: General;  Laterality: Left;  . testicular cancer  2004  . TONSILLECTOMY       SOCIAL HISTORY:  Social History   Socioeconomic History  . Marital status: Single    Spouse name: Not on file  . Number of children: 1  . Years of education: 59  . Highest education level: Not on file  Occupational History  . Occupation: retired    Comment: Psychologist, prison and probation services  . Occupation: disabled  Social Needs  . Financial resource strain: Not on file  . Food insecurity    Worry: Not on file    Inability: Not on file  . Transportation needs    Medical: Not on file    Non-medical: Not on file  Tobacco Use  . Smoking status: Current Every Day Smoker    Packs/day: 1.00    Years: 40.00    Pack years: 40.00    Types: Cigarettes    Start date: 03/31/1974  . Smokeless tobacco: Never Used  Substance and Sexual Activity  . Alcohol use: Yes    Comment: Occasional  . Drug use: No    Types: Cocaine    Comment: Remote hx of cocaine, quit 1989  . Sexual activity: Yes  Lifestyle  . Physical activity    Days per week: Not on file    Minutes per session: Not on file  . Stress: Not on file  Relationships  . Social Herbalist on phone: Not on file     Gets together: Not on file    Attends religious service: Not on file    Active member of club or organization: Not on file    Attends meetings of clubs or organizations: Not on file    Relationship status: Not on file  . Intimate partner violence    Fear of current or ex partner: No    Emotionally abused: No    Physically abused: No    Forced sexual activity: No  Other Topics Concern  . Not on file  Social History Narrative   [employer] for 12 years   [employer] for 6 years      Never married   One daughter      Lives alone   Retail banker   Right-handed   Occasional caffeine use          FAMILY HISTORY:  Family History  Problem Relation Age of Onset  .  COPD Mother   . Heart disease Mother   . Other Father        Never knew his father.  . Thyroid disease Sister   . Arthritis Sister   . Heart disease Sister        bypass  . Colon cancer Neg Hx     CURRENT MEDICATIONS:  Outpatient Encounter Medications as of 03/07/2019  Medication Sig  . bortezomib IV (VELCADE) 3.5 MG injection Inject 1.3 mg/m2 into the vein once a week.   . calcitRIOL (ROCALTROL) 0.5 MCG capsule Take 0.5 mcg by mouth daily.   . CYCLOPHOSPHAMIDE IV Inject into the vein once a week.  Marland Kitchen Epoetin Alfa (PROCRIT IJ) Inject as directed. Unsure of dosage- gets this once weekly  . Patiromer Sorbitex Calcium (VELTASSA PO) Take by mouth 2 (two) times a week.   . senna (SENOKOT) 8.6 MG tablet Take 1 tablet by mouth 2 (two) times daily.   . sodium bicarbonate 650 MG tablet Take 1,300 mg by mouth 2 (two) times daily.  Marland Kitchen torsemide (DEMADEX) 20 MG tablet TAKING 1 TABLET DAILY, AND 2 TABLETS ONLY IF HIS ANKLES ARE REALLY SWOLLEN  . valACYclovir (VALTREX) 500 MG tablet TAKE 1 TABLET BY MOUTH TWICE A DAY  . HYDROmorphone (DILAUDID) 4 MG tablet Take 1 tablet (4 mg total) by mouth every 8 (eight) hours as needed for severe pain. (Patient not taking: Reported on 03/07/2019)  . Misc. Devices MISC  Please provide patient with rollaider.  Dx: multiple myeloma C90.0 (Patient not taking: Reported on 02/14/2019)   No facility-administered encounter medications on file as of 03/07/2019.     ALLERGIES:  Allergies  Allergen Reactions  . Oxycodone-Acetaminophen   . Acetaminophen Nausea Only and Other (See Comments)    Elevated liver enzymes  . Cymbalta [Duloxetine Hcl] Swelling    Facial swelling  . Diclofenac Sodium Swelling       . Diclofenac Sodium Swelling  . Imodium [Loperamide] Swelling    facial  . Lyrica [Pregabalin] Swelling  . Morphine Other (See Comments)    Bradycardia   . Vioxx [Rofecoxib] Swelling  . Aleve [Naproxen] Swelling    eye     PHYSICAL EXAM:  ECOG Performance status: 1  Vitals:   03/07/19 1451  BP: 130/66  Pulse: 79  Resp: 16  Temp: (!) 97.1 F (36.2 C)  SpO2: 98%   Filed Weights   03/07/19 1451  Weight: 180 lb 6.4 oz (81.8 kg)    Physical Exam Vitals signs reviewed.  Constitutional:      Appearance: Normal appearance. He is normal weight.  Cardiovascular:     Rate and Rhythm: Normal rate and regular rhythm.     Heart sounds: Normal heart sounds.  Pulmonary:     Effort: Pulmonary effort is normal.     Breath sounds: Normal breath sounds.  Abdominal:     General: Bowel sounds are normal.     Palpations: Abdomen is soft.  Musculoskeletal: Normal range of motion.     Right lower leg: No edema.     Left lower leg: No edema.  Skin:    General: Skin is warm and dry.  Neurological:     Mental Status: He is alert and oriented to person, place, and time. Mental status is at baseline.  Psychiatric:        Mood and Affect: Mood normal.        Behavior: Behavior normal.      LABORATORY DATA:  I have reviewed  the labs as listed.  CBC    Component Value Date/Time   WBC 6.4 03/07/2019 1356   RBC 3.15 (L) 03/07/2019 1356   HGB 11.2 (L) 03/07/2019 1356   HCT 35.1 (L) 03/07/2019 1356   PLT 173 03/07/2019 1356   MCV 111.4 (H)  03/07/2019 1356   MCH 35.6 (H) 03/07/2019 1356   MCHC 31.9 03/07/2019 1356   RDW 15.1 03/07/2019 1356   LYMPHSABS 1.3 03/07/2019 1356   MONOABS 0.9 03/07/2019 1356   EOSABS 0.6 (H) 03/07/2019 1356   BASOSABS 0.0 03/07/2019 1356   CMP Latest Ref Rng & Units 03/07/2019 02/28/2019 02/21/2019  Glucose 70 - 99 mg/dL 98 150(H) 111(H)  BUN 6 - 20 mg/dL 59(H) 61(H) 41(H)  Creatinine 0.61 - 1.24 mg/dL 5.15(H) 4.90(H) 4.52(H)  Sodium 135 - 145 mmol/L 140 138 141  Potassium 3.5 - 5.1 mmol/L 4.9 4.6 4.4  Chloride 98 - 111 mmol/L 102 105 103  CO2 22 - 32 mmol/L _0 Calcium 8.9 - 10.3 mg/dL 9.4 9.7 9.1  Total Protein 6.5 - 8.1 g/dL 7.0 7.3 6.8  Total Bilirubin 0.3 - 1.2 mg/dL 0.4 1.0 0.6  Alkaline Phos 38 - 126 U/L 80 82 75  AST 15 - 41 U/L 13(L) 15 16  ALT 0 - 44 U/L _1 I have personally reviewed his scans and discussed with him.     ASSESSMENT & PLAN:   Kappa light chain myeloma ([place]) 1.  Kappa light chain myeloma, high risk, stage III: -Presentation with acute renal failure and hypercalcemia.  M spike was 0.4 g.  Immunofixation did not show any immunoglobulin heavy chain.  Kappa free light chains was 12,085.  Free light chain ratio was 1981 and LDH of 224. -Bone marrow biopsy on 06/16/2018 shows 87% atypical plasma cells.  Cytogenetics shows complex chromosome abnormalities.  Gain of 1 q. was seen.  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 - MRI of the brain for left-sided facial numbness was negative. - He was evaluated by Dr. Aris Lot at [facility] for transplant on 12/07/2018. -Myeloma panel on 01/17/2019 shows kappa light chains of 95, free light chain ratio improved to 3.04. -We held cyclophosphamide since 12/16/2018. -He is continuing dexamethasone 20 mg weekly.  He is tolerating Velcade reasonably well.  He had a conversation with Dr.Lambird at [facility] regarding transplantation.  I have also called and talked to  Dr. Aris Lot. -At this time he decided to wait rather than proceeding with the transplant. -I reviewed myeloma panel from 02/28/2019.  M spike is negative.  Kappa light chains have gone up to 117, previously 107.  Lambda light chains of 34.5, previously 40.8.  Ratio is 3.39, up from 2.64. -As his kappa light chains have gone up, and his creatinine trending up, I have recommended increasing dexamethasone weekly to 40 mg.  He is currently taking 20 mg with Velcade. -I plan to repeat his myeloma labs in 3 weeks and see him back in 4 weeks for follow-up. -In the last 2 to 3 days, he developed itching rash below the clavicles and in the upper back.  He denies any pain.  On examination there is a scaling plaque below the left clavicle.  We will make a referral to dermatology.  2.  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 -He will continue Retacrit 20,000 units weekly.  3.  Hyperkalemia: -Potassium is 4.9.  We will continue Veltassa.  4.  ID prophylaxis: -he had skin rash with acyclovir.  He will continue Valtrex 500 mg twice daily.  5.  Low back pain: - He will continue Dilaudid 4 mg every 8 hours which is controlling his pain.  6.  Lower extremity edema: -As his creatinine was going up, we cut back on torsemide to 20 mg every other day.  Leg swelling has not worsened.  Total time spent is 25 minutes with more than 50% of the time spent face-to-face discussing treatment plan, counseling and coordination of care.    Orders placed this encounter:  Orders Placed This Encounter  Procedures  . CBC with Differential/Platelet  . Comprehensive metabolic panel  . Protein electrophoresis, serum  . Kappa/lambda light chains  . Lactate dehydrogenase      Derek Jack, MD [place] [phone]

## 2019-03-11 ENCOUNTER — Other Ambulatory Visit (HOSPITAL_COMMUNITY): Payer: Self-pay | Admitting: *Deleted

## 2019-03-11 DIAGNOSIS — C9000 Multiple myeloma not having achieved remission: Secondary | ICD-10-CM

## 2019-03-11 DIAGNOSIS — N185 Chronic kidney disease, stage 5: Secondary | ICD-10-CM

## 2019-03-14 ENCOUNTER — Other Ambulatory Visit: Payer: Self-pay

## 2019-03-14 ENCOUNTER — Inpatient Hospital Stay (HOSPITAL_COMMUNITY): Payer: Medicare Other

## 2019-03-14 ENCOUNTER — Encounter (HOSPITAL_COMMUNITY): Payer: Self-pay

## 2019-03-14 VITALS — BP 158/78 | HR 82 | Temp 97.8°F

## 2019-03-14 DIAGNOSIS — N185 Chronic kidney disease, stage 5: Secondary | ICD-10-CM

## 2019-03-14 DIAGNOSIS — Z5112 Encounter for antineoplastic immunotherapy: Secondary | ICD-10-CM | POA: Diagnosis not present

## 2019-03-14 DIAGNOSIS — C9000 Multiple myeloma not having achieved remission: Secondary | ICD-10-CM

## 2019-03-14 LAB — COMPREHENSIVE METABOLIC PANEL
ALT: 9 U/L (ref 0–44)
AST: 11 U/L — ABNORMAL LOW (ref 15–41)
Albumin: 4.1 g/dL (ref 3.5–5.0)
Alkaline Phosphatase: 72 U/L (ref 38–126)
Anion gap: 12 (ref 5–15)
BUN: 60 mg/dL — ABNORMAL HIGH (ref 6–20)
CO2: 27 mmol/L (ref 22–32)
Calcium: 9.8 mg/dL (ref 8.9–10.3)
Chloride: 102 mmol/L (ref 98–111)
Creatinine, Ser: 5.23 mg/dL — ABNORMAL HIGH (ref 0.61–1.24)
GFR calc Af Amer: 13 mL/min — ABNORMAL LOW (ref >60)
GFR calc non Af Amer: 11 mL/min — ABNORMAL LOW (ref >60)
Glucose, Bld: 95 mg/dL (ref 70–99)
Potassium: 5.3 mmol/L — ABNORMAL HIGH (ref 3.5–5.1)
Sodium: 141 mmol/L (ref 135–145)
Total Bilirubin: 0.7 mg/dL (ref 0.3–1.2)
Total Protein: 6.9 g/dL (ref 6.5–8.1)

## 2019-03-14 LAB — CBC WITH DIFFERENTIAL/PLATELET
Abs Immature Granulocytes: 0.01 10*3/uL (ref 0.00–0.07)
Basophils Absolute: 0 10*3/uL (ref 0.0–0.1)
Basophils Relative: 0 %
Eosinophils Absolute: 0.3 10*3/uL (ref 0.0–0.5)
Eosinophils Relative: 5 %
HCT: 34.3 % — ABNORMAL LOW (ref 39.0–52.0)
Hemoglobin: 10.9 g/dL — ABNORMAL LOW (ref 13.0–17.0)
Immature Granulocytes: 0 %
Lymphocytes Relative: 12 %
Lymphs Abs: 0.8 10*3/uL (ref 0.7–4.0)
MCH: 35.2 pg — ABNORMAL HIGH (ref 26.0–34.0)
MCHC: 31.8 g/dL (ref 30.0–36.0)
MCV: 110.6 fL — ABNORMAL HIGH (ref 80.0–100.0)
Monocytes Absolute: 0.8 10*3/uL (ref 0.1–1.0)
Monocytes Relative: 12 %
Neutro Abs: 5 10*3/uL (ref 1.7–7.7)
Neutrophils Relative %: 71 %
Platelets: 174 10*3/uL (ref 150–400)
RBC: 3.1 MIL/uL — ABNORMAL LOW (ref 4.22–5.81)
RDW: 14.9 % (ref 11.5–15.5)
WBC: 7 10*3/uL (ref 4.0–10.5)
nRBC: 0 % (ref 0.0–0.2)

## 2019-03-14 MED ORDER — BORTEZOMIB CHEMO SQ INJECTION 3.5 MG (2.5MG/ML)
1.3000 mg/m2 | Freq: Once | INTRAMUSCULAR | Status: AC
  Administered 2019-03-14: 2.5 mg via SUBCUTANEOUS
  Filled 2019-03-14: qty 1

## 2019-03-14 MED ORDER — ONDANSETRON HCL 4 MG PO TABS
8.0000 mg | ORAL_TABLET | Freq: Once | ORAL | Status: AC
  Administered 2019-03-14: 12:00:00 8 mg via ORAL
  Filled 2019-03-14: qty 2

## 2019-03-14 MED ORDER — DEXAMETHASONE 4 MG PO TABS
20.0000 mg | ORAL_TABLET | Freq: Once | ORAL | Status: AC
  Administered 2019-03-14: 20 mg via ORAL
  Filled 2019-03-14: qty 5

## 2019-03-14 MED ORDER — EPOETIN ALFA-EPBX 10000 UNIT/ML IJ SOLN
20000.0000 [IU] | Freq: Once | INTRAMUSCULAR | Status: AC
  Administered 2019-03-14: 12:00:00 20000 [IU] via SUBCUTANEOUS
  Filled 2019-03-14: qty 2

## 2019-03-14 NOTE — Patient Instructions (Signed)
[facility] Discharge Instructions for Patients Receiving Chemotherapy   Beginning January 23rd 2017 lab work for the [facility] will be done in the  Main lab at [facility] on 1st floor. If you have a lab appointment with the [facility] please come in thru the  Main Entrance and check in at the main information desk   Today you received the following chemotherapy agents Velcade injection as well as Retacrit injection. Follow-up as scheduled. Call clinic for any questions or concerns  To help prevent nausea and vomiting after your treatment, we encourage you to take your nausea medication   If you develop nausea and vomiting, or diarrhea that is not controlled by your medication, call the clinic.  The clinic phone number is [phone]. Office hours are Monday-Friday 8:30am-5:00pm.  BELOW ARE SYMPTOMS THAT SHOULD BE REPORTED IMMEDIATELY:  *FEVER GREATER THAN 101.0 F  *CHILLS WITH OR WITHOUT FEVER  NAUSEA AND VOMITING THAT IS NOT CONTROLLED WITH YOUR NAUSEA MEDICATION  *UNUSUAL SHORTNESS OF BREATH  *UNUSUAL BRUISING OR BLEEDING  TENDERNESS IN MOUTH AND THROAT WITH OR WITHOUT PRESENCE OF ULCERS  *URINARY PROBLEMS  *BOWEL PROBLEMS  UNUSUAL RASH Items with * indicate a potential emergency and should be followed up as soon as possible. If you have an emergency after office hours please contact your primary care physician or go to the nearest emergency department.  Please call the clinic during office hours if you have any questions or concerns.   You may also contact the Patient Navigator at [phone] should you have any questions or need assistance in obtaining follow up care.      Resources For Cancer Patients and their Caregivers ? American Cancer Society: Can assist with transportation, wigs, general needs, runs Look Good Feel Better.        636-596-0238 ? Cancer Care: Provides financial assistance, online support groups,  medication/co-pay assistance.  1-800-813-HOPE 442-601-1337) ? [place] Assists [place] cancer patients and their families through emotional , educational and financial support.  423-257-9521 ? [place] DSS Where to apply for food stamps, Medicaid and utility assistance. 925-285-4196 ? [facility]: Transportation to medical appointments. (862) 561-7686 ? Social Security Administration: May apply for disability if have a Stage IV cancer. 769-345-2914 279-703-7669 ? [employer], Disability and Transit Services: Assists with nutrition, care and transit needs. (856)595-1289

## 2019-03-14 NOTE — Progress Notes (Signed)
[name] reviewed with Dr. Delton Coombes and pt approved for Velcade and Retacrit injections                                                       [name] tolerated Velcade and Retacrit injections well without complaints or incident. Hgb 10.9 today. VSS Pt discharged self ambulatory in satisfactory condition

## 2019-03-21 ENCOUNTER — Other Ambulatory Visit: Payer: Self-pay

## 2019-03-21 ENCOUNTER — Inpatient Hospital Stay (HOSPITAL_COMMUNITY): Payer: Medicare Other

## 2019-03-21 ENCOUNTER — Encounter (HOSPITAL_COMMUNITY): Payer: Self-pay

## 2019-03-21 VITALS — BP 119/72 | HR 99 | Temp 97.1°F | Resp 18 | Wt 178.2 lb

## 2019-03-21 DIAGNOSIS — C9000 Multiple myeloma not having achieved remission: Secondary | ICD-10-CM

## 2019-03-21 DIAGNOSIS — N185 Chronic kidney disease, stage 5: Secondary | ICD-10-CM

## 2019-03-21 DIAGNOSIS — Z5112 Encounter for antineoplastic immunotherapy: Secondary | ICD-10-CM | POA: Diagnosis not present

## 2019-03-21 LAB — CBC WITH DIFFERENTIAL/PLATELET
Abs Immature Granulocytes: 0.03 10*3/uL (ref 0.00–0.07)
Basophils Absolute: 0 10*3/uL (ref 0.0–0.1)
Basophils Relative: 0 %
Eosinophils Absolute: 0.4 10*3/uL (ref 0.0–0.5)
Eosinophils Relative: 6 %
HCT: 34.8 % — ABNORMAL LOW (ref 39.0–52.0)
Hemoglobin: 11.3 g/dL — ABNORMAL LOW (ref 13.0–17.0)
Immature Granulocytes: 0 %
Lymphocytes Relative: 17 %
Lymphs Abs: 1.2 10*3/uL (ref 0.7–4.0)
MCH: 36.2 pg — ABNORMAL HIGH (ref 26.0–34.0)
MCHC: 32.5 g/dL (ref 30.0–36.0)
MCV: 111.5 fL — ABNORMAL HIGH (ref 80.0–100.0)
Monocytes Absolute: 1 10*3/uL (ref 0.1–1.0)
Monocytes Relative: 15 %
Neutro Abs: 4.3 10*3/uL (ref 1.7–7.7)
Neutrophils Relative %: 62 %
Platelets: 227 10*3/uL (ref 150–400)
RBC: 3.12 MIL/uL — ABNORMAL LOW (ref 4.22–5.81)
RDW: 15.4 % (ref 11.5–15.5)
WBC: 7 10*3/uL (ref 4.0–10.5)
nRBC: 0 % (ref 0.0–0.2)

## 2019-03-21 LAB — COMPREHENSIVE METABOLIC PANEL
ALT: 9 U/L (ref 0–44)
AST: 14 U/L — ABNORMAL LOW (ref 15–41)
Albumin: 4.1 g/dL (ref 3.5–5.0)
Alkaline Phosphatase: 73 U/L (ref 38–126)
Anion gap: 12 (ref 5–15)
BUN: 61 mg/dL — ABNORMAL HIGH (ref 6–20)
CO2: 24 mmol/L (ref 22–32)
Calcium: 9.4 mg/dL (ref 8.9–10.3)
Chloride: 105 mmol/L (ref 98–111)
Creatinine, Ser: 5.32 mg/dL — ABNORMAL HIGH (ref 0.61–1.24)
GFR calc Af Amer: 13 mL/min — ABNORMAL LOW (ref >60)
GFR calc non Af Amer: 11 mL/min — ABNORMAL LOW (ref >60)
Glucose, Bld: 82 mg/dL (ref 70–99)
Potassium: 4.5 mmol/L (ref 3.5–5.1)
Sodium: 141 mmol/L (ref 135–145)
Total Bilirubin: 0.4 mg/dL (ref 0.3–1.2)
Total Protein: 7.1 g/dL (ref 6.5–8.1)

## 2019-03-21 LAB — MAGNESIUM: Magnesium: 2.3 mg/dL (ref 1.7–2.4)

## 2019-03-21 LAB — PHOSPHORUS: Phosphorus: 6.1 mg/dL — ABNORMAL HIGH (ref 2.5–4.6)

## 2019-03-21 LAB — LACTATE DEHYDROGENASE: LDH: 164 U/L (ref 98–192)

## 2019-03-21 MED ORDER — BORTEZOMIB CHEMO SQ INJECTION 3.5 MG (2.5MG/ML)
1.3000 mg/m2 | Freq: Once | INTRAMUSCULAR | Status: AC
  Administered 2019-03-21: 2.5 mg via SUBCUTANEOUS
  Filled 2019-03-21: qty 1

## 2019-03-21 MED ORDER — ONDANSETRON HCL 4 MG PO TABS
8.0000 mg | ORAL_TABLET | Freq: Once | ORAL | Status: AC
  Administered 2019-03-21: 15:00:00 8 mg via ORAL
  Filled 2019-03-21: qty 2

## 2019-03-21 MED ORDER — EPOETIN ALFA-EPBX 10000 UNIT/ML IJ SOLN: 20000.0000 [IU] | Freq: Once | INTRAMUSCULAR | Status: DC

## 2019-03-21 MED ORDER — DEXAMETHASONE 4 MG PO TABS
20.0000 mg | ORAL_TABLET | Freq: Once | ORAL | Status: AC
  Administered 2019-03-21: 20 mg via ORAL
  Filled 2019-03-21: qty 5

## 2019-03-21 NOTE — Progress Notes (Signed)
Serum creatinine 5.32 today.  No complaints voiced.  No s/s of distress noted.  Ok to treat verbal order Francene Finders, NP.   Patient tolerated Velcade injection with no complaints voiced.  Site clean and dry with no bruising or swelling noted at site.  Band aid applied.  Vss with discharge and left ambulatory with no s/s of distress noted.

## 2019-03-21 NOTE — Progress Notes (Signed)
03/21/19  Ok to treat with scr 5.32  T.O. Wenda Low, NP-C/Franke Menter Ronnald Ramp, PharmD

## 2019-03-22 LAB — PROTEIN ELECTROPHORESIS, SERUM
A/G Ratio: 1.7 (ref 0.7–1.7)
Albumin ELP: 4 g/dL (ref 2.9–4.4)
Alpha-1-Globulin: 0.1 g/dL (ref 0.0–0.4)
Alpha-2-Globulin: 0.7 g/dL (ref 0.4–1.0)
Beta Globulin: 0.8 g/dL (ref 0.7–1.3)
Gamma Globulin: 0.7 g/dL (ref 0.4–1.8)
Globulin, Total: 2.4 g/dL (ref 2.2–3.9)
Total Protein ELP: 6.4 g/dL (ref 6.0–8.5)

## 2019-03-22 LAB — KAPPA/LAMBDA LIGHT CHAINS
Kappa free light chain: 150.7 mg/L — ABNORMAL HIGH (ref 3.3–19.4)
Kappa, lambda light chain ratio: 4.57 — ABNORMAL HIGH (ref 0.26–1.65)
Lambda free light chains: 33 mg/L — ABNORMAL HIGH (ref 5.7–26.3)

## 2019-03-28 ENCOUNTER — Inpatient Hospital Stay (HOSPITAL_COMMUNITY): Payer: Medicare Other

## 2019-03-28 ENCOUNTER — Ambulatory Visit (HOSPITAL_COMMUNITY): Payer: Medicare Other

## 2019-03-28 ENCOUNTER — Encounter (HOSPITAL_COMMUNITY): Payer: Medicare Other

## 2019-03-29 ENCOUNTER — Inpatient Hospital Stay (HOSPITAL_COMMUNITY): Payer: Medicare Other

## 2019-03-29 ENCOUNTER — Other Ambulatory Visit: Payer: Self-pay

## 2019-03-29 VITALS — BP 165/94 | HR 95 | Temp 97.6°F | Resp 18

## 2019-03-29 DIAGNOSIS — Z5112 Encounter for antineoplastic immunotherapy: Secondary | ICD-10-CM | POA: Diagnosis not present

## 2019-03-29 DIAGNOSIS — C9000 Multiple myeloma not having achieved remission: Secondary | ICD-10-CM

## 2019-03-29 LAB — CBC WITH DIFFERENTIAL/PLATELET
Abs Immature Granulocytes: 0.02 10*3/uL (ref 0.00–0.07)
Basophils Absolute: 0 10*3/uL (ref 0.0–0.1)
Basophils Relative: 0 %
Eosinophils Absolute: 0.2 10*3/uL (ref 0.0–0.5)
Eosinophils Relative: 3 %
HCT: 36.2 % — ABNORMAL LOW (ref 39.0–52.0)
Hemoglobin: 11.9 g/dL — ABNORMAL LOW (ref 13.0–17.0)
Immature Granulocytes: 0 %
Lymphocytes Relative: 12 %
Lymphs Abs: 0.9 10*3/uL (ref 0.7–4.0)
MCH: 36.2 pg — ABNORMAL HIGH (ref 26.0–34.0)
MCHC: 32.9 g/dL (ref 30.0–36.0)
MCV: 110 fL — ABNORMAL HIGH (ref 80.0–100.0)
Monocytes Absolute: 0.8 10*3/uL (ref 0.1–1.0)
Monocytes Relative: 11 %
Neutro Abs: 5.6 10*3/uL (ref 1.7–7.7)
Neutrophils Relative %: 74 %
Platelets: 242 10*3/uL (ref 150–400)
RBC: 3.29 MIL/uL — ABNORMAL LOW (ref 4.22–5.81)
RDW: 15 % (ref 11.5–15.5)
WBC: 7.6 10*3/uL (ref 4.0–10.5)
nRBC: 0 % (ref 0.0–0.2)

## 2019-03-29 LAB — COMPREHENSIVE METABOLIC PANEL
ALT: 11 U/L (ref 0–44)
AST: 15 U/L (ref 15–41)
Albumin: 4.4 g/dL (ref 3.5–5.0)
Alkaline Phosphatase: 73 U/L (ref 38–126)
Anion gap: 13 (ref 5–15)
BUN: 53 mg/dL — ABNORMAL HIGH (ref 6–20)
CO2: 25 mmol/L (ref 22–32)
Calcium: 10.2 mg/dL (ref 8.9–10.3)
Chloride: 102 mmol/L (ref 98–111)
Creatinine, Ser: 4.79 mg/dL — ABNORMAL HIGH (ref 0.61–1.24)
GFR calc Af Amer: 14 mL/min — ABNORMAL LOW (ref >60)
GFR calc non Af Amer: 12 mL/min — ABNORMAL LOW (ref >60)
Glucose, Bld: 114 mg/dL — ABNORMAL HIGH (ref 70–99)
Potassium: 4.1 mmol/L (ref 3.5–5.1)
Sodium: 140 mmol/L (ref 135–145)
Total Bilirubin: 0.9 mg/dL (ref 0.3–1.2)
Total Protein: 7.6 g/dL (ref 6.5–8.1)

## 2019-03-29 LAB — LACTATE DEHYDROGENASE: LDH: 183 U/L (ref 98–192)

## 2019-03-29 MED ORDER — DEXAMETHASONE 4 MG PO TABS
20.0000 mg | ORAL_TABLET | Freq: Once | ORAL | Status: AC
  Administered 2019-03-29: 20 mg via ORAL
  Filled 2019-03-29: qty 5

## 2019-03-29 MED ORDER — ONDANSETRON HCL 4 MG PO TABS
8.0000 mg | ORAL_TABLET | Freq: Once | ORAL | Status: AC
  Administered 2019-03-29: 8 mg via ORAL
  Filled 2019-03-29: qty 2

## 2019-03-29 MED ORDER — BORTEZOMIB CHEMO SQ INJECTION 3.5 MG (2.5MG/ML)
1.3000 mg/m2 | Freq: Once | INTRAMUSCULAR | Status: AC
  Administered 2019-03-29: 2.5 mg via SUBCUTANEOUS
  Filled 2019-03-29: qty 1

## 2019-03-29 NOTE — Progress Notes (Signed)
Reviewed serum creatinine 4.78 with Reynolds Bowl, NP, and ok to treat today.   Patient tolerated Velcade injection with no complaints voiced.  Site clean and dry with no bruising or swelling noted at site.  Band aid applied.  Vss with discharge and left ambulatory with no s/s of distress noted.

## 2019-03-29 NOTE — Progress Notes (Signed)
03/29/19  OK to proceed with HR 104  T.O. Reynolds Bowl, NP-C/Fynn Adel Ronnald Ramp, PharmD

## 2019-03-30 LAB — PROTEIN ELECTROPHORESIS, SERUM
A/G Ratio: 1.8 — ABNORMAL HIGH (ref 0.7–1.7)
Albumin ELP: 4.4 g/dL (ref 2.9–4.4)
Alpha-1-Globulin: 0.1 g/dL (ref 0.0–0.4)
Alpha-2-Globulin: 0.7 g/dL (ref 0.4–1.0)
Beta Globulin: 0.9 g/dL (ref 0.7–1.3)
Gamma Globulin: 0.7 g/dL (ref 0.4–1.8)
Globulin, Total: 2.5 g/dL (ref 2.2–3.9)
Total Protein ELP: 6.9 g/dL (ref 6.0–8.5)

## 2019-03-30 LAB — KAPPA/LAMBDA LIGHT CHAINS
Kappa free light chain: 223.3 mg/L — ABNORMAL HIGH (ref 3.3–19.4)
Kappa, lambda light chain ratio: 7.57 — ABNORMAL HIGH (ref 0.26–1.65)
Lambda free light chains: 29.5 mg/L — ABNORMAL HIGH (ref 5.7–26.3)

## 2019-04-04 ENCOUNTER — Other Ambulatory Visit: Payer: Self-pay

## 2019-04-04 ENCOUNTER — Inpatient Hospital Stay (HOSPITAL_COMMUNITY): Payer: Medicare Other

## 2019-04-04 ENCOUNTER — Encounter (HOSPITAL_COMMUNITY): Payer: Self-pay | Admitting: Hematology

## 2019-04-04 ENCOUNTER — Inpatient Hospital Stay (HOSPITAL_BASED_OUTPATIENT_CLINIC_OR_DEPARTMENT_OTHER): Payer: Medicare Other | Admitting: Hematology

## 2019-04-04 ENCOUNTER — Inpatient Hospital Stay (HOSPITAL_COMMUNITY): Payer: Medicare Other | Attending: Hematology

## 2019-04-04 DIAGNOSIS — D631 Anemia in chronic kidney disease: Secondary | ICD-10-CM | POA: Insufficient documentation

## 2019-04-04 DIAGNOSIS — E876 Hypokalemia: Secondary | ICD-10-CM | POA: Diagnosis not present

## 2019-04-04 DIAGNOSIS — N189 Chronic kidney disease, unspecified: Secondary | ICD-10-CM | POA: Diagnosis not present

## 2019-04-04 DIAGNOSIS — C9000 Multiple myeloma not having achieved remission: Secondary | ICD-10-CM | POA: Insufficient documentation

## 2019-04-04 DIAGNOSIS — M545 Low back pain: Secondary | ICD-10-CM | POA: Insufficient documentation

## 2019-04-04 DIAGNOSIS — N185 Chronic kidney disease, stage 5: Secondary | ICD-10-CM

## 2019-04-04 DIAGNOSIS — R200 Anesthesia of skin: Secondary | ICD-10-CM | POA: Insufficient documentation

## 2019-04-04 DIAGNOSIS — K5900 Constipation, unspecified: Secondary | ICD-10-CM | POA: Diagnosis not present

## 2019-04-04 DIAGNOSIS — E875 Hyperkalemia: Secondary | ICD-10-CM | POA: Insufficient documentation

## 2019-04-04 DIAGNOSIS — Z5112 Encounter for antineoplastic immunotherapy: Secondary | ICD-10-CM | POA: Insufficient documentation

## 2019-04-04 LAB — COMPREHENSIVE METABOLIC PANEL
ALT: 8 U/L (ref 0–44)
AST: 13 U/L — ABNORMAL LOW (ref 15–41)
Albumin: 4.4 g/dL (ref 3.5–5.0)
Alkaline Phosphatase: 67 U/L (ref 38–126)
Anion gap: 12 (ref 5–15)
BUN: 50 mg/dL — ABNORMAL HIGH (ref 6–20)
CO2: 26 mmol/L (ref 22–32)
Calcium: 9.7 mg/dL (ref 8.9–10.3)
Chloride: 102 mmol/L (ref 98–111)
Creatinine, Ser: 5.07 mg/dL — ABNORMAL HIGH (ref 0.61–1.24)
GFR calc Af Amer: 13 mL/min — ABNORMAL LOW (ref >60)
GFR calc non Af Amer: 11 mL/min — ABNORMAL LOW (ref >60)
Glucose, Bld: 100 mg/dL — ABNORMAL HIGH (ref 70–99)
Potassium: 4.3 mmol/L (ref 3.5–5.1)
Sodium: 140 mmol/L (ref 135–145)
Total Bilirubin: 0.9 mg/dL (ref 0.3–1.2)
Total Protein: 7.4 g/dL (ref 6.5–8.1)

## 2019-04-04 LAB — CBC WITH DIFFERENTIAL/PLATELET
Abs Immature Granulocytes: 0.02 10*3/uL (ref 0.00–0.07)
Basophils Absolute: 0 10*3/uL (ref 0.0–0.1)
Basophils Relative: 1 %
Eosinophils Absolute: 0.2 10*3/uL (ref 0.0–0.5)
Eosinophils Relative: 3 %
HCT: 34.9 % — ABNORMAL LOW (ref 39.0–52.0)
Hemoglobin: 11.4 g/dL — ABNORMAL LOW (ref 13.0–17.0)
Immature Granulocytes: 0 %
Lymphocytes Relative: 16 %
Lymphs Abs: 0.8 10*3/uL (ref 0.7–4.0)
MCH: 36.1 pg — ABNORMAL HIGH (ref 26.0–34.0)
MCHC: 32.7 g/dL (ref 30.0–36.0)
MCV: 110.4 fL — ABNORMAL HIGH (ref 80.0–100.0)
Monocytes Absolute: 0.7 10*3/uL (ref 0.1–1.0)
Monocytes Relative: 13 %
Neutro Abs: 3.6 10*3/uL (ref 1.7–7.7)
Neutrophils Relative %: 67 %
Platelets: 223 10*3/uL (ref 150–400)
RBC: 3.16 MIL/uL — ABNORMAL LOW (ref 4.22–5.81)
RDW: 14.6 % (ref 11.5–15.5)
WBC: 5.3 10*3/uL (ref 4.0–10.5)
nRBC: 0 % (ref 0.0–0.2)

## 2019-04-04 MED ORDER — ONDANSETRON HCL 4 MG PO TABS
ORAL_TABLET | ORAL | Status: AC
  Filled 2019-04-04: qty 2

## 2019-04-04 MED ORDER — DEXAMETHASONE 4 MG PO TABS
ORAL_TABLET | ORAL | Status: AC
  Filled 2019-04-04: qty 5

## 2019-04-04 MED ORDER — ONDANSETRON HCL 4 MG PO TABS
8.0000 mg | ORAL_TABLET | Freq: Once | ORAL | Status: AC
  Administered 2019-04-04: 8 mg via ORAL

## 2019-04-04 MED ORDER — BORTEZOMIB CHEMO SQ INJECTION 3.5 MG (2.5MG/ML)
1.3000 mg/m2 | Freq: Once | INTRAMUSCULAR | Status: AC
  Administered 2019-04-04: 2.5 mg via SUBCUTANEOUS
  Filled 2019-04-04: qty 1

## 2019-04-04 MED ORDER — SODIUM CHLORIDE 0.9 % IV SOLN: Freq: Once | INTRAVENOUS | Status: DC

## 2019-04-04 MED ORDER — SODIUM CHLORIDE 0.9% FLUSH: 10.0000 mL | INTRAVENOUS | Status: DC | PRN

## 2019-04-04 MED ORDER — DEXAMETHASONE 4 MG PO TABS
20.0000 mg | ORAL_TABLET | Freq: Once | ORAL | Status: AC
  Administered 2019-04-04: 20 mg via ORAL

## 2019-04-04 MED ORDER — HEPARIN SOD (PORK) LOCK FLUSH 100 UNIT/ML IV SOLN: 500.0000 [IU] | Freq: Once | INTRAVENOUS | Status: DC | PRN

## 2019-04-04 NOTE — Progress Notes (Signed)
Verbal orders received from [facility] LPN that patient is good to proceed with treatment. Hgb today 11.4. Creatinine 5.07. MD aware. Labs reviewed.

## 2019-04-04 NOTE — Patient Instructions (Addendum)
[facility] at [facility] Discharge Instructions  You were seen today by Dr. Katragadda. He went over your recent lab results. He will see you back in 1 week for labs and follow up.   Thank you for choosing [facility] at [facility] to provide your oncology and hematology care.  To afford each patient quality time with our provider, please arrive at least 15 minutes before your scheduled appointment time.   If you have a lab appointment with the Cancer Center please come in thru the  Main Entrance and check in at the main information desk  You need to re-schedule your appointment should you arrive 10 or more minutes late.  We strive to give you quality time with our providers, and arriving late affects you and other patients whose appointments are after yours.  Also, if you no show three or more times for appointments you may be dismissed from the clinic at the providers discretion.     Again, thank you for choosing [facility].  Our hope is that these requests will decrease the amount of time that you wait before being seen by our physicians.       _____________________________________________________________  Should you have questions after your visit to [facility], please contact our office at [phone] between the hours of 8:00 a.m. and 4:30 p.m.  Voicemails left after 4:00 p.m. will not be returned until the following business day.  For prescription refill requests, have your pharmacy contact our office and allow 72 hours.    Cancer Center Support Programs:   > Cancer Support Group  2nd Tuesday of the month 1pm-2pm, Journey Room    

## 2019-04-04 NOTE — Progress Notes (Signed)
[name], [place] [address]   CLINIC:  Medical Oncology/Hematology  PCP:  Rosita Fire, MD [place] [address] [phone]   REASON FOR VISIT: Follow-up for Multiple myeloma    BRIEF ONCOLOGIC HISTORY:  Oncology History  Kappa light chain myeloma ([place])  06/16/2018 Initial Diagnosis   Kappa light chain myeloma ([place])   06/17/2018 -  Chemotherapy   The patient had palonosetron (ALOXI) injection 0.25 mg, 0.25 mg, Intravenous,  Once, 27 of 27 cycles Administration: 0.25 mg (06/17/2018), 0.25 mg (06/23/2018), 0.25 mg (06/30/2018), 0.25 mg (07/07/2018), 0.25 mg (07/14/2018), 0.25 mg (07/22/2018), 0.25 mg (07/29/2018), 0.25 mg (08/06/2018), 0.25 mg (08/13/2018), 0.25 mg (08/20/2018), 0.25 mg (08/27/2018), 0.25 mg (09/06/2018), 0.25 mg (09/13/2018), 0.25 mg (09/20/2018), 0.25 mg (09/27/2018), 0.25 mg (10/04/2018), 0.25 mg (10/11/2018), 0.25 mg (10/18/2018), 0.25 mg (10/25/2018), 0.25 mg (11/01/2018), 0.25 mg (11/08/2018), 0.25 mg (11/15/2018), 0.25 mg (11/22/2018), 0.25 mg (11/29/2018), 0.25 mg (12/08/2018), 0.25 mg (12/16/2018) bortezomib SQ (VELCADE) chemo injection 2.5 mg, 1.3 mg/m2 = 2.5 mg, Subcutaneous,  Once, 41 of 42 cycles Administration: 2.5 mg (06/17/2018), 2.5 mg (06/23/2018), 2.5 mg (06/30/2018), 2.5 mg (07/07/2018), 2.5 mg (07/14/2018), 2.5 mg (07/22/2018), 2.5 mg (07/29/2018), 2.5 mg (08/06/2018), 2.5 mg (08/13/2018), 2.5 mg (08/20/2018), 2.5 mg (08/27/2018), 2.5 mg (09/06/2018), 2.5 mg (09/13/2018), 2.5 mg (09/20/2018), 2.5 mg (09/27/2018), 2.5 mg (10/04/2018), 2.5 mg (10/11/2018), 2.5 mg (10/18/2018), 2.5 mg (10/25/2018), 2.5 mg (11/01/2018), 2.5 mg (11/08/2018), 2.5 mg (11/15/2018), 2.5 mg (11/22/2018), 2.5 mg (11/29/2018), 2.5 mg (12/08/2018), 2.5 mg (12/16/2018), 2.5 mg (12/27/2018), 2.5 mg (01/03/2019), 2.5 mg (01/10/2019), 2.5 mg (01/17/2019), 2.5 mg (01/24/2019), 2.5 mg (01/31/2019), 2.5 mg (02/07/2019), 2.5 mg (02/14/2019), 2.5 mg (02/21/2019), 2.5 mg (02/28/2019), 2.5 mg  (03/07/2019), 2.5 mg (03/14/2019), 2.5 mg (03/21/2019), 2.5 mg (03/29/2019), 2.5 mg (04/04/2019) cyclophosphamide (CYTOXAN) 300 mg in sodium chloride 0.9 % 250 mL chemo infusion, 300 mg (100 % of original dose 300 mg), Intravenous,  Once, 26 of 26 cycles Dose modification: 300 mg (original dose 300 mg, Cycle 1, Reason: Change in SCr/CrCl), 500 mg (original dose 500 mg, Cycle 2, Reason: Provider Judgment), 500 mg (original dose 500 mg, Cycle 3, Reason: Change in SCr/CrCl) Administration: 300 mg (06/17/2018), 500 mg (06/23/2018), 500 mg (06/30/2018), 600 mg (07/07/2018), 600 mg (07/14/2018), 600 mg (07/22/2018), 600 mg (07/29/2018), 600 mg (08/06/2018), 600 mg (08/13/2018), 600 mg (08/20/2018), 600 mg (08/27/2018), 600 mg (09/06/2018), 600 mg (09/13/2018), 600 mg (09/20/2018), 600 mg (09/27/2018), 600 mg (10/04/2018), 600 mg (10/11/2018), 600 mg (10/18/2018), 600 mg (10/25/2018), 600 mg (11/01/2018), 600 mg (11/08/2018), 600 mg (11/15/2018), 600 mg (11/22/2018), 600 mg (11/29/2018), 600 mg (12/08/2018), 600 mg (12/16/2018)  for chemotherapy treatment.       INTERVAL HISTORY:  [name] 59 y.o. male seen for follow-up of multiple myeloma.  He is tolerating Velcade very well.  He requests refill for his pain medication.  He is taking Dilaudid every 8 hours.  Appetite is 100%.  Energy levels are 75%.  Numbness in the bottom of the feet is stable.  Denies any fevers or chills.  REVIEW OF SYSTEMS:  Review of Systems  Neurological: Positive for numbness.  All other systems reviewed and are negative.    PAST MEDICAL/SURGICAL HISTORY:  Past Medical History:  Diagnosis Date  . Allergy   . Arthritis    neck and back  . Blood transfusion without reported diagnosis   . BPH (benign prostatic hyperplasia)   . Cancer [facility]) 2004  testicle  . Chronic kidney disease    kidney stones  . Left lumbar radiculopathy 06/12/2016  . 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 Medical history non-contributory    Pt has scattered thoughts and uncertain of  past medical history  . 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 Stones, urinary tract   . Substance abuse ([place])    prescribed oxydcodone- 10 years   Past Surgical History:  Procedure Laterality Date  . BACK SURGERY     x5  . CERVICAL DISC SURGERY     x2  . COLONOSCOPY WITH PROPOFOL N/A 08/11/2016   Dr. Gala Romney: non-bleeding internal hemorrhoids, one 4 mm hyperplastic rectal polyp, diverticulosis in entire examined colon  . POLYPECTOMY  08/11/2016   Procedure: POLYPECTOMY;  Surgeon: [name], MD;  Location: [facility];  Service: Endoscopy;;  colon  . PORTACATH PLACEMENT Left 09/20/2018   Procedure: INSERTION PORT-A-CATH (catheter attached left subclavian);  Surgeon: Virl Cagey, MD;  Location: [facility] ORS;  Service: General;  Laterality: Left;  . testicular cancer  2004  . TONSILLECTOMY       SOCIAL HISTORY:  Social History   Socioeconomic History  . Marital status: Single    Spouse name: Not on file  . Number of children: 1  . Years of education: 54  . Highest education level: Not on file  Occupational History  . Occupation: retired    Comment: Psychologist, prison and probation services  . Occupation: disabled  Tobacco Use  . Smoking status: Current Every Day Smoker    Packs/day: 1.00    Years: 40.00    Pack years: 40.00    Types: Cigarettes    Start date: 03/31/1974  . Smokeless tobacco: Never Used  Substance and Sexual Activity  . Alcohol use: Yes    Comment: Occasional  . Drug use: No    Types: Cocaine    Comment: Remote hx of cocaine, quit 1989  . Sexual activity: Yes  Other Topics Concern  . Not on file  Social History Narrative   [employer] for 12 years   [employer] for 74 years      Never married   One daughter      Lives alone   Retail banker   Right-handed   Occasional caffeine use         Social Determinants of Health   Financial Resource Strain:   . Difficulty of Paying Living Expenses: Not on file  Food Insecurity:   . Worried About Ship broker in the Last Year: Not on file  . Ran Out of Food in the Last Year: Not on file  Transportation Needs:   . Lack of Transportation (Medical): Not on file  . Lack of Transportation (Non-Medical): Not on file  Physical Activity:   . Days of Exercise per Week: Not on file  . Minutes of Exercise per Session: Not on file  Stress:   . Feeling of Stress : Not on file  Social Connections:   . Frequency of Communication with Friends and Family: Not on file  . Frequency of Social Gatherings with Friends and Family: Not on file  . Attends Religious Services: Not on file  . Active Member of Clubs or Organizations: Not on file  . Attends Archivist Meetings: Not on file  . Marital Status: Not on file  Intimate Partner Violence: Not At Risk  . Fear of Current or Ex-Partner: No  . Emotionally Abused: No  . Physically Abused:  No  . Sexually Abused: No    FAMILY HISTORY:  Family History  Problem Relation Age of Onset  . COPD Mother   . Heart disease Mother   . Other Father        Never knew his father.  . Thyroid disease Sister   . Arthritis Sister   . Heart disease Sister        bypass  . Colon cancer Neg Hx     CURRENT MEDICATIONS:  Outpatient Encounter Medications as of 04/04/2019  Medication Sig  . bortezomib IV (VELCADE) 3.5 MG injection Inject 1.3 mg/m2 into the vein once a week.   . calcitRIOL (ROCALTROL) 0.5 MCG capsule Take 0.5 mcg by mouth daily.   . CYCLOPHOSPHAMIDE IV Inject into the vein once a week.  Marland Kitchen Epoetin Alfa (PROCRIT IJ) Inject as directed. Unsure of dosage- gets this once weekly  . Patiromer Sorbitex Calcium (VELTASSA PO) Take by mouth 2 (two) times a week.   . senna (SENOKOT) 8.6 MG tablet Take 1 tablet by mouth 2 (two) times daily.   . sodium bicarbonate 650 MG tablet Take 1,300 mg by mouth 2 (two) times daily.  . valACYclovir (VALTREX) 500 MG tablet TAKE 1 TABLET BY MOUTH TWICE A DAY  . HYDROmorphone (DILAUDID) 4 MG tablet Take 1 tablet (4 mg  total) by mouth every 8 (eight) hours as needed for severe pain.  . Misc. Devices MISC Please provide patient with rollaider.  Dx: multiple myeloma C90.0 (Patient not taking: Reported on 04/04/2019)  . torsemide (DEMADEX) 20 MG tablet TAKING 1 TABLET DAILY, AND 2 TABLETS ONLY IF HIS ANKLES ARE REALLY SWOLLEN (Patient not taking: Reported on 04/04/2019)  . [DISCONTINUED] diphenhydrAMINE (SOMINEX) 25 MG tablet Take by mouth.  . [DISCONTINUED] HYDROmorphone (DILAUDID) 4 MG tablet Take 1 tablet (4 mg total) by mouth every 8 (eight) hours as needed for severe pain. (Patient not taking: Reported on 04/04/2019)   No facility-administered encounter medications on file as of 04/04/2019.    ALLERGIES:  Allergies  Allergen Reactions  . Oxycodone-Acetaminophen   . Acetaminophen Nausea Only and Other (See Comments)    Elevated liver enzymes  . Cymbalta [Duloxetine Hcl] Swelling    Facial swelling  . Diclofenac Sodium Swelling       . Diclofenac Sodium Swelling  . Imodium [Loperamide] Swelling    facial  . Lyrica [Pregabalin] Swelling  . Morphine Other (See Comments)    Bradycardia   . Vioxx [Rofecoxib] Swelling  . Aleve [Naproxen] Swelling    eye     PHYSICAL EXAM:  ECOG Performance status: 1  Vitals:   04/04/19 1207  BP: (!) 151/91  Pulse: 93  Resp: 18  Temp: (!) 97.5 F (36.4 C)  SpO2: 99%   Filed Weights   04/04/19 1207  Weight: 177 lb 4.8 oz (80.4 kg)    Physical Exam Vitals reviewed.  Constitutional:      Appearance: Normal appearance. He is normal weight.  Cardiovascular:     Rate and Rhythm: Normal rate and regular rhythm.     Heart sounds: Normal heart sounds.  Pulmonary:     Effort: Pulmonary effort is normal.     Breath sounds: Normal breath sounds.  Abdominal:     General: Bowel sounds are normal.     Palpations: Abdomen is soft.  Musculoskeletal:        General: Normal range of motion.     Right lower leg: No edema.     Left lower leg:  No edema.  Skin:     General: Skin is warm and dry.  Neurological:     Mental Status: He is alert and oriented to person, place, and time. Mental status is at baseline.  Psychiatric:        Mood and Affect: Mood normal.        Behavior: Behavior normal.      LABORATORY DATA:  I have reviewed the labs as listed.  CBC    Component Value Date/Time   WBC 5.3 04/04/2019 1121   RBC 3.16 (L) 04/04/2019 1121   HGB 11.4 (L) 04/04/2019 1121   HCT 34.9 (L) 04/04/2019 1121   PLT 223 04/04/2019 1121   MCV 110.4 (H) 04/04/2019 1121   MCH 36.1 (H) 04/04/2019 1121   MCHC 32.7 04/04/2019 1121   RDW 14.6 04/04/2019 1121   LYMPHSABS 0.8 04/04/2019 1121   MONOABS 0.7 04/04/2019 1121   EOSABS 0.2 04/04/2019 1121   BASOSABS 0.0 04/04/2019 1121   CMP Latest Ref Rng & Units 04/04/2019 03/29/2019 03/21/2019  Glucose 70 - 99 mg/dL 100(H) 114(H) 82  BUN 6 - 20 mg/dL 50(H) 53(H) 61(H)  Creatinine 0.61 - 1.24 mg/dL 5.07(H) 4.79(H) 5.32(H)  Sodium 135 - 145 mmol/L 140 140 141  Potassium 3.5 - 5.1 mmol/L 4.3 4.1 4.5  Chloride 98 - 111 mmol/L 102 102 105  CO2 22 - 32 mmol/L _0 Calcium 8.9 - 10.3 mg/dL 9.7 10.2 9.4  Total Protein 6.5 - 8.1 g/dL 7.4 7.6 7.1  Total Bilirubin 0.3 - 1.2 mg/dL 0.9 0.9 0.4  Alkaline Phos 38 - 126 U/L 67 73 73  AST 15 - 41 U/L 13(L) 15 14(L)  ALT 0 - 44 U/L _1 I have personally reviewed his scans and discussed with him.     ASSESSMENT & PLAN:   Kappa light chain myeloma ([place]) 1.  Kappa light chain myeloma, high risk, stage III: -Presentation with acute renal failure and hypercalcemia.  M spike was 0.4 g.  Immunofixation did not show any immunoglobulin heavy chain.  Kappa free light chains was 12,085.  Free light chain ratio was 1981 and LDH of 224. -Bone marrow biopsy on 06/16/2018 shows 87% atypical plasma cells.  Cytogenetics shows complex chromosome abnormalities.  Gain of 1 q. was seen.  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 - MRI of the brain for left-sided facial numbness was negative. - He was evaluated by Dr. Aris Lot at [facility] for transplant on 12/07/2018. -Myeloma panel on 01/17/2019 shows kappa light chains of 95, free light chain ratio improved to 3.04. -We held cyclophosphamide since 12/16/2018. -He is continuing dexamethasone 20 mg weekly.  He is tolerating Velcade reasonably well.  He had a conversation with Dr.Lambird at [facility] regarding transplantation.  I have also called and talked to Dr. Aris Lot. -We reviewed myeloma panel from 03/29/2019.  Kappa light chains increased to 223 from 150.  Ratio increased to 7.57 from 4.57. -I think he is losing response.  I have called and talked to Dr. Aris Lot at [facility].  The patient is still on the fence with transplant at this time. -I think it is essential at this time to change his treatments.  Options include carfilzomib, pomalidomide and dexamethasone.  Other option is to add daratumumab to the Velcade and dexamethasone. -He will proceed with Velcade today.  I plan to discuss this issue with him again  next week.  2.  Anemia: -Etiology is combination of CKD and myeloma. -He is receiving Retacrit 20,000 units as needed.  3.  Hyperkalemia: -Potassium is 4.3.  He will continue Veltassa.  4.  ID prophylaxis: -He will continue Valtrex 100 mg twice daily.  5.  Low back pain: -He will continue Dilaudid 4 mg every 8 hours as needed.  I have sent a new prescription.  6.  Leg swelling: -He is taking torsemide 20 mg every other day.  Total time spent is 40 minutes with more than 50% of the time spent face-to-face discussing plan and coordination of care.  Orders placed this encounter:  No orders of the defined types were placed in this encounter.     Derek Jack, MD [place] [phone]

## 2019-04-05 MED ORDER — HYDROMORPHONE HCL 4 MG PO TABS: 4.0000 mg | ORAL_TABLET | Freq: Three times a day (TID) | ORAL | 0 refills | Status: DC | PRN

## 2019-04-05 NOTE — Assessment & Plan Note (Signed)
1.  Kappa light chain myeloma, high risk, stage III: -Presentation with acute renal failure and hypercalcemia.  M spike was 0.4 g.  Immunofixation did not show any immunoglobulin heavy chain.  Kappa free light chains was 12,085.  Free light chain ratio was 1981 and LDH of 224. -Bone marrow biopsy on 06/16/2018 shows 87% atypical plasma cells.  Cytogenetics shows complex chromosome abnormalities.  Gain of 1 q. was seen.  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 - MRI of the brain for left-sided facial numbness was negative. - He was evaluated by Dr. Aris Lot at [facility] for transplant on 12/07/2018. -Myeloma panel on 01/17/2019 shows kappa light chains of 95, free light chain ratio improved to 3.04. -We held cyclophosphamide since 12/16/2018. -He is continuing dexamethasone 20 mg weekly.  He is tolerating Velcade reasonably well.  He had a conversation with Dr.Lambird at [facility] regarding transplantation.  I have also called and talked to Dr. Aris Lot. -We reviewed myeloma panel from 03/29/2019.  Kappa light chains increased to 223 from 150.  Ratio increased to 7.57 from 4.57. -I think he is losing response.  I have called and talked to Dr. Aris Lot at [facility].  The patient is still on the fence with transplant at this time. -I think it is essential at this time to change his treatments.  Options include carfilzomib, pomalidomide and dexamethasone.  Other option is to add daratumumab to the Velcade and dexamethasone. -He will proceed with Velcade today.  I plan to discuss this issue with him again next week.  2.  Anemia: -Etiology is combination of CKD and myeloma. -He is receiving Retacrit 20,000 units as needed.  3.  Hyperkalemia: -Potassium is 4.3.  He will continue Veltassa.  4.  ID prophylaxis: -He will continue Valtrex 100 mg twice daily.  5.  Low back pain: -He will continue Dilaudid 4 mg every 8 hours as needed.  I have sent a new  prescription.  6.  Leg swelling: -He is taking torsemide 20 mg every other day.

## 2019-04-13 ENCOUNTER — Other Ambulatory Visit: Payer: Self-pay

## 2019-04-13 ENCOUNTER — Inpatient Hospital Stay (HOSPITAL_COMMUNITY): Payer: Medicare Other

## 2019-04-13 ENCOUNTER — Inpatient Hospital Stay (HOSPITAL_BASED_OUTPATIENT_CLINIC_OR_DEPARTMENT_OTHER): Payer: Medicare Other | Admitting: Hematology

## 2019-04-13 ENCOUNTER — Encounter (HOSPITAL_COMMUNITY): Payer: Self-pay | Admitting: Hematology

## 2019-04-13 VITALS — BP 162/64 | HR 78 | Temp 97.5°F | Resp 18 | Wt 180.2 lb

## 2019-04-13 DIAGNOSIS — N185 Chronic kidney disease, stage 5: Secondary | ICD-10-CM

## 2019-04-13 DIAGNOSIS — C9000 Multiple myeloma not having achieved remission: Secondary | ICD-10-CM | POA: Diagnosis not present

## 2019-04-13 DIAGNOSIS — Z5112 Encounter for antineoplastic immunotherapy: Secondary | ICD-10-CM | POA: Diagnosis not present

## 2019-04-13 LAB — CBC WITH DIFFERENTIAL/PLATELET
Abs Immature Granulocytes: 0.02 10*3/uL (ref 0.00–0.07)
Basophils Absolute: 0.1 10*3/uL (ref 0.0–0.1)
Basophils Relative: 1 %
Eosinophils Absolute: 0.3 10*3/uL (ref 0.0–0.5)
Eosinophils Relative: 5 %
HCT: 33.3 % — ABNORMAL LOW (ref 39.0–52.0)
Hemoglobin: 10.8 g/dL — ABNORMAL LOW (ref 13.0–17.0)
Immature Granulocytes: 0 %
Lymphocytes Relative: 16 %
Lymphs Abs: 1 10*3/uL (ref 0.7–4.0)
MCH: 35.6 pg — ABNORMAL HIGH (ref 26.0–34.0)
MCHC: 32.4 g/dL (ref 30.0–36.0)
MCV: 109.9 fL — ABNORMAL HIGH (ref 80.0–100.0)
Monocytes Absolute: 0.9 10*3/uL (ref 0.1–1.0)
Monocytes Relative: 14 %
Neutro Abs: 4.2 10*3/uL (ref 1.7–7.7)
Neutrophils Relative %: 64 %
Platelets: 253 10*3/uL (ref 150–400)
RBC: 3.03 MIL/uL — ABNORMAL LOW (ref 4.22–5.81)
RDW: 14.7 % (ref 11.5–15.5)
WBC: 6.5 10*3/uL (ref 4.0–10.5)
nRBC: 0 % (ref 0.0–0.2)

## 2019-04-13 LAB — COMPREHENSIVE METABOLIC PANEL
ALT: 10 U/L (ref 0–44)
AST: 12 U/L — ABNORMAL LOW (ref 15–41)
Albumin: 3.8 g/dL (ref 3.5–5.0)
Alkaline Phosphatase: 65 U/L (ref 38–126)
Anion gap: 13 (ref 5–15)
BUN: 54 mg/dL — ABNORMAL HIGH (ref 6–20)
CO2: 26 mmol/L (ref 22–32)
Calcium: 10 mg/dL (ref 8.9–10.3)
Chloride: 101 mmol/L (ref 98–111)
Creatinine, Ser: 5.31 mg/dL — ABNORMAL HIGH (ref 0.61–1.24)
GFR calc Af Amer: 13 mL/min — ABNORMAL LOW (ref >60)
GFR calc non Af Amer: 11 mL/min — ABNORMAL LOW (ref >60)
Glucose, Bld: 94 mg/dL (ref 70–99)
Potassium: 4.6 mmol/L (ref 3.5–5.1)
Sodium: 140 mmol/L (ref 135–145)
Total Bilirubin: 0.5 mg/dL (ref 0.3–1.2)
Total Protein: 6.9 g/dL (ref 6.5–8.1)

## 2019-04-13 MED ORDER — POMALIDOMIDE 3 MG PO CAPS: 3.0000 mg | ORAL_CAPSULE | Freq: Every day | ORAL | 0 refills | Status: DC

## 2019-04-13 NOTE — Progress Notes (Signed)
START ON PATHWAY REGIMEN - Multiple Myeloma and Other Plasma Cell Dyscrasias     A cycle is every 28 days:     Carfilzomib      Carfilzomib      Carfilzomib      Pomalidomide      Dexamethasone   **Always confirm dose/schedule in your pharmacy ordering system**  Patient Characteristics: Relapsed / Refractory, Second through Fourth Lines of Therapy R-ISS Staging: III Disease Classification: Relapsed Line of Therapy: Second Line Intent of Therapy: Non-Curative / Palliative Intent, Discussed with Patient

## 2019-04-13 NOTE — Patient Instructions (Addendum)
[place] at [facility] Discharge Instructions  You were seen today by Dr. Delton Coombes. He went over your recent lab results. He discussed starting you on two new medications, Kyprolis and Pomalyst. Both of these will be taken 3 weeks on and one week off. He will see you back in 1 week with labs and Tx     Thank you for choosing [place] at [facility] to provide your oncology and hematology care.  To afford each patient quality time with our provider, please arrive at least 15 minutes before your scheduled appointment time.   If you have a lab appointment with the [facility] please come in thru the  Main Entrance and check in at the main information desk  You need to re-schedule your appointment should you arrive 10 or more minutes late.  We strive to give you quality time with our providers, and arriving late affects you and other patients whose appointments are after yours.  Also, if you no show three or more times for appointments you may be dismissed from the clinic at the providers discretion.     Again, thank you for choosing [facility].  Our hope is that these requests will decrease the amount of time that you wait before being seen by our physicians.       _____________________________________________________________  Should you have questions after your visit to [facility], please contact our office at [phone] between the hours of 8:00 a.m. and 4:30 p.m.  Voicemails left after 4:00 p.m. will not be returned until the following business day.  For prescription refill requests, have your pharmacy contact our office and allow 72 hours.    Cancer Center Support Programs:   > Cancer Support Group  2nd Tuesday of the month 1pm-2pm, Journey Room

## 2019-04-13 NOTE — Progress Notes (Signed)
[place], [place] [address]   CLINIC:  Medical Oncology/Hematology  PCP:  Rosita Fire, MD [place] [id number] [phone]   REASON FOR VISIT: Follow-up for Multiple myeloma    BRIEF ONCOLOGIC HISTORY:  Oncology History  Kappa light chain myeloma ([place])  06/16/2018 Initial Diagnosis   Kappa light chain myeloma ([place])   06/17/2018 - 04/10/2019 Chemotherapy   The patient had palonosetron (ALOXI) injection 0.25 mg, 0.25 mg, Intravenous,  Once, 27 of 27 cycles Administration: 0.25 mg (06/17/2018), 0.25 mg (06/23/2018), 0.25 mg (06/30/2018), 0.25 mg (07/07/2018), 0.25 mg (07/14/2018), 0.25 mg (07/22/2018), 0.25 mg (07/29/2018), 0.25 mg (08/06/2018), 0.25 mg (08/13/2018), 0.25 mg (08/20/2018), 0.25 mg (08/27/2018), 0.25 mg (09/06/2018), 0.25 mg (09/13/2018), 0.25 mg (09/20/2018), 0.25 mg (09/27/2018), 0.25 mg (10/04/2018), 0.25 mg (10/11/2018), 0.25 mg (10/18/2018), 0.25 mg (10/25/2018), 0.25 mg (11/01/2018), 0.25 mg (11/08/2018), 0.25 mg (11/15/2018), 0.25 mg (11/22/2018), 0.25 mg (11/29/2018), 0.25 mg (12/08/2018), 0.25 mg (12/16/2018) bortezomib SQ (VELCADE) chemo injection 2.5 mg, 1.3 mg/m2 = 2.5 mg, Subcutaneous,  Once, 41 of 42 cycles Administration: 2.5 mg (06/17/2018), 2.5 mg (06/23/2018), 2.5 mg (06/30/2018), 2.5 mg (07/07/2018), 2.5 mg (07/14/2018), 2.5 mg (07/22/2018), 2.5 mg (07/29/2018), 2.5 mg (08/06/2018), 2.5 mg (08/13/2018), 2.5 mg (08/20/2018), 2.5 mg (08/27/2018), 2.5 mg (09/06/2018), 2.5 mg (09/13/2018), 2.5 mg (09/20/2018), 2.5 mg (09/27/2018), 2.5 mg (10/04/2018), 2.5 mg (10/11/2018), 2.5 mg (10/18/2018), 2.5 mg (10/25/2018), 2.5 mg (11/01/2018), 2.5 mg (11/08/2018), 2.5 mg (11/15/2018), 2.5 mg (11/22/2018), 2.5 mg (11/29/2018), 2.5 mg (12/08/2018), 2.5 mg (12/16/2018), 2.5 mg (12/27/2018), 2.5 mg (01/03/2019), 2.5 mg (01/10/2019), 2.5 mg (01/17/2019), 2.5 mg (01/24/2019), 2.5 mg (01/31/2019), 2.5 mg (02/07/2019), 2.5 mg (02/14/2019), 2.5 mg (02/21/2019), 2.5 mg (02/28/2019), 2.5  mg (03/07/2019), 2.5 mg (03/14/2019), 2.5 mg (03/21/2019), 2.5 mg (03/29/2019), 2.5 mg (04/04/2019) cyclophosphamide (CYTOXAN) 300 mg in sodium chloride 0.9 % 250 mL chemo infusion, 300 mg (100 % of original dose 300 mg), Intravenous,  Once, 26 of 26 cycles Dose modification: 300 mg (original dose 300 mg, Cycle 1, Reason: Change in SCr/CrCl), 500 mg (original dose 500 mg, Cycle 2, Reason: Provider Judgment), 500 mg (original dose 500 mg, Cycle 3, Reason: Change in SCr/CrCl) Administration: 300 mg (06/17/2018), 500 mg (06/23/2018), 500 mg (06/30/2018), 600 mg (07/07/2018), 600 mg (07/14/2018), 600 mg (07/22/2018), 600 mg (07/29/2018), 600 mg (08/06/2018), 600 mg (08/13/2018), 600 mg (08/20/2018), 600 mg (08/27/2018), 600 mg (09/06/2018), 600 mg (09/13/2018), 600 mg (09/20/2018), 600 mg (09/27/2018), 600 mg (10/04/2018), 600 mg (10/11/2018), 600 mg (10/18/2018), 600 mg (10/25/2018), 600 mg (11/01/2018), 600 mg (11/08/2018), 600 mg (11/15/2018), 600 mg (11/22/2018), 600 mg (11/29/2018), 600 mg (12/08/2018), 600 mg (12/16/2018)  for chemotherapy treatment.    04/20/2019 -  Chemotherapy   The patient had carfilzomib (KYPROLIS) 40 mg in dextrose 5 % 50 mL chemo infusion, 20 mg/m2, Intravenous,  Once, 0 of 6 cycles Dose modification: 27 mg/m2 (Cycle 1, Reason: Provider Judgment)  for chemotherapy treatment.       INTERVAL HISTORY:  [name] 59 y.o. male seen for follow-up of multiple myeloma.  Numbness in the bottom of the feet has been stable.  Reports worsening back pain and leg pain today.  Appetite is 50%.  Energy levels are also 50%.  Pain in the back is rated as 8 out of 10.  Denies any fevers or chills.  No skin rashes reported.  No ER visits or hospitalizations.  REVIEW OF SYSTEMS:  Review of Systems  Musculoskeletal: Positive  for back pain.  Neurological: Positive for numbness.  All other systems reviewed and are negative.    PAST MEDICAL/SURGICAL HISTORY:  Past Medical History:  Diagnosis Date  . Allergy   .  Arthritis    neck and back  . Blood transfusion without reported diagnosis   . BPH (benign prostatic hyperplasia)   . Cancer [facility]) 2004   testicle  . Chronic kidney disease    kidney stones  . Left lumbar radiculopathy 06/12/2016  . 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 Medical history non-contributory    Pt has scattered thoughts and uncertain of past medical history  . 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 Stones, urinary tract   . Substance abuse ([place])    prescribed oxydcodone- 10 years   Past Surgical History:  Procedure Laterality Date  . BACK SURGERY     x5  . CERVICAL DISC SURGERY     x2  . COLONOSCOPY WITH PROPOFOL N/A 08/11/2016   Dr. Gala Romney: non-bleeding internal hemorrhoids, one 4 mm hyperplastic rectal polyp, diverticulosis in entire examined colon  . POLYPECTOMY  08/11/2016   Procedure: POLYPECTOMY;  Surgeon: [name], MD;  Location: [facility] ENDO SUITE;  Service: Endoscopy;;  colon  . PORTACATH PLACEMENT Left 09/20/2018   Procedure: INSERTION PORT-A-CATH (catheter attached left subclavian);  Surgeon: Virl Cagey, MD;  Location: [facility] ORS;  Service: General;  Laterality: Left;  . testicular cancer  2004  . TONSILLECTOMY       SOCIAL HISTORY:  Social History   Socioeconomic History  . Marital status: Single    Spouse name: Not on file  . Number of children: 1  . Years of education: 1  . Highest education level: Not on file  Occupational History  . Occupation: retired    Comment: Psychologist, prison and probation services  . Occupation: disabled  Tobacco Use  . Smoking status: Current Every Day Smoker    Packs/day: 1.00    Years: 40.00    Pack years: 40.00    Types: Cigarettes    Start date: 03/31/1974  . Smokeless tobacco: Never Used  Substance and Sexual Activity  . Alcohol use: Yes    Comment: Occasional  . Drug use: No    Types: Cocaine    Comment: Remote hx of cocaine, quit 1989  . Sexual activity: Yes  Other Topics Concern  . Not on file  Social History Narrative   [employer] for  12 years   [employer] for 44 years      Never married   One daughter      Lives alone   Retail banker   Right-handed   Occasional caffeine use         Social Determinants of Health   Financial Resource Strain:   . Difficulty of Paying Living Expenses: Not on file  Food Insecurity:   . Worried About Charity fundraiser in the Last Year: Not on file  . Ran Out of Food in the Last Year: Not on file  Transportation Needs:   . Lack of Transportation (Medical): Not on file  . Lack of Transportation (Non-Medical): Not on file  Physical Activity:   . Days of Exercise per Week: Not on file  . Minutes of Exercise per Session: Not on file  Stress:   . Feeling of Stress : Not on file  Social Connections:   . Frequency of Communication with Friends and Family: Not on file  . Frequency of Social Gatherings with Friends and  Family: Not on file  . Attends Religious Services: Not on file  . Active Member of Clubs or Organizations: Not on file  . Attends Archivist Meetings: Not on file  . Marital Status: Not on file  Intimate Partner Violence: Not At Risk  . Fear of Current or Ex-Partner: No  . Emotionally Abused: No  . Physically Abused: No  . Sexually Abused: No    FAMILY HISTORY:  Family History  Problem Relation Age of Onset  . COPD Mother   . Heart disease Mother   . Other Father        Never knew his father.  . Thyroid disease Sister   . Arthritis Sister   . Heart disease Sister        bypass  . Colon cancer Neg Hx     CURRENT MEDICATIONS:  Outpatient Encounter Medications as of 04/13/2019  Medication Sig  . bortezomib IV (VELCADE) 3.5 MG injection Inject 1.3 mg/m2 into the vein once a week.   . calcitRIOL (ROCALTROL) 0.5 MCG capsule Take 0.5 mcg by mouth daily.   . CYCLOPHOSPHAMIDE IV Inject into the vein once a week.  Marland Kitchen Epoetin Alfa (PROCRIT IJ) Inject as directed. Unsure of dosage- gets this once weekly  . Patiromer Sorbitex  Calcium (VELTASSA PO) Take by mouth 2 (two) times a week.   . senna (SENOKOT) 8.6 MG tablet Take 1 tablet by mouth 2 (two) times daily.   . sodium bicarbonate 650 MG tablet Take 1,300 mg by mouth 2 (two) times daily.  . valACYclovir (VALTREX) 500 MG tablet TAKE 1 TABLET BY MOUTH TWICE A DAY  . HYDROmorphone (DILAUDID) 4 MG tablet Take 1 tablet (4 mg total) by mouth every 8 (eight) hours as needed for severe pain. (Patient not taking: Reported on 04/13/2019)  . Misc. Devices MISC Please provide patient with rollaider.  Dx: multiple myeloma C90.0 (Patient not taking: Reported on 04/04/2019)  . pomalidomide (POMALYST) 3 MG capsule Take 1 capsule (3 mg total) by mouth daily.  Marland Kitchen torsemide (DEMADEX) 20 MG tablet TAKING 1 TABLET DAILY, AND 2 TABLETS ONLY IF HIS ANKLES ARE REALLY SWOLLEN (Patient not taking: Reported on 04/04/2019)   No facility-administered encounter medications on file as of 04/13/2019.    ALLERGIES:  Allergies  Allergen Reactions  . Oxycodone-Acetaminophen   . Acetaminophen Nausea Only and Other (See Comments)    Elevated liver enzymes  . Cymbalta [Duloxetine Hcl] Swelling    Facial swelling  . Diclofenac Sodium Swelling       . Diclofenac Sodium Swelling  . Imodium [Loperamide] Swelling    facial  . Lyrica [Pregabalin] Swelling  . Morphine Other (See Comments)    Bradycardia   . Vioxx [Rofecoxib] Swelling  . Aleve [Naproxen] Swelling    eye     PHYSICAL EXAM:  ECOG Performance status: 1  Vitals:   04/13/19 0930  BP: (!) 162/64  Pulse: 78  Resp: 18  Temp: (!) 97.5 F (36.4 C)  SpO2: 98%   Filed Weights   04/13/19 0930  Weight: 180 lb 3.2 oz (81.7 kg)    Physical Exam Vitals reviewed.  Constitutional:      Appearance: Normal appearance. He is normal weight.  Cardiovascular:     Rate and Rhythm: Normal rate and regular rhythm.     Heart sounds: Normal heart sounds.  Pulmonary:     Effort: Pulmonary effort is normal.     Breath sounds: Normal breath  sounds.  Abdominal:  General: Bowel sounds are normal.     Palpations: Abdomen is soft.  Musculoskeletal:        General: Normal range of motion.     Right lower leg: No edema.     Left lower leg: No edema.  Skin:    General: Skin is warm and dry.  Neurological:     Mental Status: He is alert and oriented to person, place, and time. Mental status is at baseline.  Psychiatric:        Mood and Affect: Mood normal.        Behavior: Behavior normal.      LABORATORY DATA:  I have reviewed the labs as listed.  CBC    Component Value Date/Time   WBC 6.5 04/13/2019 0916   RBC 3.03 (L) 04/13/2019 0916   HGB 10.8 (L) 04/13/2019 0916   HCT 33.3 (L) 04/13/2019 0916   PLT 253 04/13/2019 0916   MCV 109.9 (H) 04/13/2019 0916   MCH 35.6 (H) 04/13/2019 0916   MCHC 32.4 04/13/2019 0916   RDW 14.7 04/13/2019 0916   LYMPHSABS 1.0 04/13/2019 0916   MONOABS 0.9 04/13/2019 0916   EOSABS 0.3 04/13/2019 0916   BASOSABS 0.1 04/13/2019 0916   CMP Latest Ref Rng & Units 04/13/2019 04/04/2019 03/29/2019  Glucose 70 - 99 mg/dL 94 100(H) 114(H)  BUN 6 - 20 mg/dL 54(H) 50(H) 53(H)  Creatinine 0.61 - 1.24 mg/dL 5.31(H) 5.07(H) 4.79(H)  Sodium 135 - 145 mmol/L 140 140 140  Potassium 3.5 - 5.1 mmol/L 4.6 4.3 4.1  Chloride 98 - 111 mmol/L 101 102 102  CO2 22 - 32 mmol/L 26 26 25   Calcium 8.9 - 10.3 mg/dL 10.0 9.7 10.2  Total Protein 6.5 - 8.1 g/dL 6.9 7.4 7.6  Total Bilirubin 0.3 - 1.2 mg/dL 0.5 0.9 0.9  Alkaline Phos 38 - 126 U/L 65 67 73  AST 15 - 41 U/L 12(L) 13(L) 15  ALT 0 - 44 U/L 10 8 11       I have personally reviewed his scans and discussed with him.     ASSESSMENT & PLAN:   Kappa light chain myeloma ([place]) 1.  Kappa light chain myeloma, high risk, stage III: -Bone marrow biopsy on 06/16/2018 shows 87% plasma cells.  Cytogenetics shows complex chromosome abnormalities.  Gain of 1 q. present.  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 -Last myeloma panel on 03/29/2019 shows kappa light chains to 23 increased from 150.  Ratio increased to 7.57 from 4.57. -We discussed about possibility of bone marrow transplant.  Patient refused it. -I have talked to transplant physician at [facility].  Change in therapy was recommended. -We talked about carfilzomib, pomalidomide and dexamethasone regimen.  I will started pomalidomide 3 mg dose. -We talked about the side effects of this regimen in detail.  He already has a port placed. -We will tentatively start his first treatment next week.  2.  Hyperkalemia: -Potassium is 4.6.  He will continue Veltassa.  3.  Anemia: -Etiology is CKD and myeloma.  He is receiving Retacrit as needed.  4.  ID prophylaxis: -He will continue Valtrex twice daily.  5.  Low back pain: -He will continue Dilaudid 4 mg every 8 hours. -He reported a slight worsening of pain this morning in the lower back and left leg.  6.  Leg swelling: -He will continue torsemide 20  mg as needed.  Total time spent is 30 minutes with more than 50% of the time spent face-to-face discussing change in treatment plan, counseling and coordination of care.  Orders placed this encounter:  Orders Placed This Encounter  Procedures  . CBC with Differential/Platelet  . Comprehensive metabolic panel  . Protein electrophoresis, serum  . Kappa/lambda light chains  . Lactate dehydrogenase      Derek Jack, MD [place] [phone]

## 2019-04-13 NOTE — Assessment & Plan Note (Signed)
1.  Kappa light chain myeloma, high risk, stage III: -Bone marrow biopsy on 06/16/2018 shows 87% plasma cells.  Cytogenetics shows complex chromosome abnormalities.  Gain of 1 q. present.  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 -Last myeloma panel on 03/29/2019 shows kappa light chains to 23 increased from 150.  Ratio increased to 7.57 from 4.57. -We discussed about possibility of bone marrow transplant.  Patient refused it. -I have talked to transplant physician at [facility].  Change in therapy was recommended. -We talked about carfilzomib, pomalidomide and dexamethasone regimen.  I will started pomalidomide 3 mg dose. -We talked about the side effects of this regimen in detail.  He already has a port placed. -We will tentatively start his first treatment next week.  2.  Hyperkalemia: -Potassium is 4.6.  He will continue Veltassa.  3.  Anemia: -Etiology is CKD and myeloma.  He is receiving Retacrit as needed.  4.  ID prophylaxis: -He will continue Valtrex twice daily.  5.  Low back pain: -He will continue Dilaudid 4 mg every 8 hours. -He reported a slight worsening of pain this morning in the lower back and left leg.  6.  Leg swelling: -He will continue torsemide 20 mg as needed.

## 2019-04-18 ENCOUNTER — Other Ambulatory Visit (HOSPITAL_COMMUNITY): Payer: Self-pay | Admitting: *Deleted

## 2019-04-18 DIAGNOSIS — C9000 Multiple myeloma not having achieved remission: Secondary | ICD-10-CM

## 2019-04-18 NOTE — Progress Notes (Signed)
Patient called with concerns about increased pain in his back.  He has been taking 2 pills as directed by Dr. Delton Coombes during his last visit.  He is going to run out early.  He is concerned about that.  Per Dr. Delton Coombes orders received for MRI without contrast of the thoracic and lumbar spine.   Orders placed and patient is aware of appointments.

## 2019-04-19 ENCOUNTER — Encounter (HOSPITAL_COMMUNITY): Payer: Self-pay | Admitting: *Deleted

## 2019-04-20 NOTE — Patient Instructions (Addendum)
Carfilzomib (Kyprolis)  About This Drug Carfilzomib is used to treat cancer. It is given in the vein (IV).  Possible Side Effects . Bone marrow suppression. This is a decrease in the number of white blood cells, red blood cells, and platelets. This may raise your risk of infection, make you tired and weak (fatigue), and raise your risk of bleeding. . Nausea . Diarrhea (loose bowel movements) . Tiredness . Swelling of your legs, ankles and/or feet . Fever . Headache . Trouble sleeping . Cough and trouble breathing . Upper respiratory infection . High blood pressure  Note: Each of the side effects above was reported in 20% or greater of patients treated with carfilzomib alone or in combination with other medications. Not all possible side effects are included above.  Warnings and Precautions . Congestive heart failure and/or risk of heart attack which can be life-threatening . Changes in your kidney function which can cause kidney failure and be life-threatening . Tumor lysis syndrome. This drug may act on the cancer cells very quickly. This may affect how your kidneys work. Tumor lysis can be life-threatening. . Inflammation (swelling) or scarring of the lungs or fluid build-up in your lungs, which can be lifethreatening. You may have a dry cough or trouble breathing which may be severe. . High blood pressure in arteries of the lungs . High blood pressure, which can be life-threatening . Blood clots and events such as heart attack. A blood clot in your leg may cause your leg to swell, appear red and warm, and/or cause pain. A blood clot in your lungs may cause trouble breathing, pain when breathing, and/or chest pain. If you use hormonal contraception, discuss choice of contraception and risk of blood clots with your medical team. . While you are getting this drug in your vein (IV), you may have a reaction to the drug, which can be life-threatening. Sometimes you may be given  medication to stop or lessen these side effects. Your nurse will check you closely for these signs: fever or shaking chills, flushing, facial swelling, feeling dizzy, headache, trouble breathing, rash, itching, chest tightness, or chest pain. These reactions may happen after your infusion. If this happens, call 911 for emergency care. . Abnormal bleeding which can be life-threatening - symptoms may be coughing up blood, throwing up blood (may look like coffee grounds), red or black tarry bowel movements, abnormally heavy menstrual flow, nosebleeds, or any other unusual bleeding. . Severe decrease in platelets which can increase your risk of bleeding . Rare blood vessels disorder called thrombotic microangiopathy which can affect some of your organs in your body such as your kidneys and can be life-threatening . Changes in your liver function, which can cause liver failure and be life-threatening . Changes in your central nervous system can happen. The central nervous system is made up of your brain and spinal cord. You could feel extreme tiredness, agitation, confusion, hallucinations (see or hear things that are not there), trouble understanding or speaking, loss of control of your bowels or bladder, eyesight changes, numbness, or lack of strength to your arms, legs, face, or body, and coma. If you start to have any of these symptoms let your doctor know right away.  Note: Some of the side effects above are very rare. If you have concerns and/or questions, please discuss them with your medical team.  Important Information . This drug may be present in the saliva, tears, sweat, urine, stool, vomit, semen, and vaginal secretions. Talk to your doctor  and/or your nurse about the necessary precautions to take during this time. . This drug may impair your ability to drive or use machinery. Use caution and tell your nurse or doctor if you feel dizzy, very sleepy, and/or experience low blood  pressure.  Treating Side Effects . Manage tiredness by pacing your activities for the day. . Be sure to include periods of rest between energy-draining activities. . To decrease the risk of infection, wash your hands regularly. . Avoid close contact with people who have a cold, the flu, or other infections. . Take your temperature as your doctor or nurse tells you, and whenever you feel like you may have a fever. . To help decrease the risk of bleeding, use a soft toothbrush. Check with your nurse before using dental floss. . Be very careful when using knives or tools. . Use an electric shaver instead of a razor. . Drink plenty of fluids (a minimum of eight glasses per day is recommended). . If you throw up or have loose bowel movements, you should drink more fluids so that you do not become dehydrated (lack of water in the body from losing too much fluid). . To help with nausea and vomiting, eat small, frequent meals instead of three large meals a day. Choose foods and drinks that are at room temperature. Ask your nurse or doctor about other helpful tips and medicine that is available to help stop or lessen these symptoms. . If you have diarrhea, eat low-fiber foods that are high in protein and calories and avoid foods that can irritate your digestive tracts or lead to cramping. . Ask your nurse or doctor about medicine that can lessen or stop your diarrhea. Marland Kitchen Keeping your pain under control is important to your well-being. Please tell your doctor or nurse if you are experiencing pain. . If you are having trouble sleeping, talk to your nurse or doctor on tips to help you sleep better. . Infusion reactions may occur after your infusion. If this happens, call 911 for emergency care.  Food and Drug Interactions . There are no known interactions of carfilzomib with food. . This drug may interact with other medicines. Tell your doctor and pharmacist about all the prescription and  over-the-counter medicines and dietary supplements (vitamins, minerals, herbs, and others) that you are taking at this time. Also, check with your doctor or pharmacist before starting any new prescription or over-the-counter medicines, or dietary supplements to make sure that there are no interactions.  When to Call the Doctor Call your doctor or nurse if you have any of these symptoms and/or any new or unusual symptoms: . Fever of 100.4 F (38 C) or higher . Chills . Tiredness that interferes with your daily activities . A headache that does not go away . Blurry vision or other changes in eyesight . Confusion and/or agitation . Hallucinations . Trouble understanding or speaking . Numbness or lack of strength to your arms, legs, face, or body . Feeling dizzy or lightheaded . Trouble falling or staying asleep . Easy bleeding or bruising . Dry cough or coughing up yellow, green, or bloody mucus . Wheezing or trouble breathing . Lips or skin turn a bluish color . Symptoms of a seizure such as confusion, blacking out, passing out, loss of hearing or vision, blurred vision, unusual smells or tastes (such as burning rubber), trouble talking, tremors or shaking in parts or all of the body, repeated body movements, tense muscles that do not relax, and loss  of control of urine and bowels. If you or your family member suspects you are having a seizure, call 911 right away. . Chest pain or symptoms of a heart attack. Most heart attacks involve pain in the center of the chest that lasts more than a few minutes. The pain may go away and come back or it can be constant. It can feel like pressure, squeezing, fullness, or pain. Sometimes pain is felt in one or both arms, the back, neck, jaw, or stomach. If any of these symptoms last 2 minutes, call 911. Marland Kitchen Feeling that your heart is beating fast or in a not normal way (palpitations) . Nausea that stops you from eating or drinking and/or is not  relieved by prescribed medicines . Throwing up more than 3 times a day . Diarrhea, 4 times in one day or diarrhea with lack of strength or a feeling of being dizzy . Your leg or arm is swollen, red, warm, and/or painful . Swelling of arms, hands, legs and/or feet . Weight gain of 5 pounds in one week (fluid retention) . Blood in your urine, vomit (bright red or coffee-ground) and/or stools (bright red, or black/tarry) . Decreased urine or very dark urine . Signs of infusion reaction: fever or shaking chills, flushing, facial swelling, feeling dizzy, headache, trouble breathing, rash, itching, chest tightness, or chest pain. If this happens, call 911 for emergency care. . Signs of possible liver problems: dark urine, pale bowel movements, bad stomach pain, feeling very tired and weak, unusual itching, or yellowing of the eyes or skin . Signs of tumor lysis: confusion or agitation, decreased urine, nausea/vomiting, diarrhea, muscle cramping, numbness and/or tingling, or seizures . Pain that does not go away or is not relieved by prescribed medicine . If you think you may be pregnant or may have impregnated your partner  Reproduction Warnings . Pregnancy warning: This drug can have harmful effects on the unborn baby. Women of childbearing potential should use effective methods of birth control during your cancer treatment and for at least 6 months after treatment. Men with male partners of childbearing potential should use effective methods of birth control during your cancer treatment and for at least 3 months after your cancer treatment. Let your doctor know right away if you think you may be pregnant or may have impregnated your partner. . Breastfeeding warning: Women should not breastfeed during treatment and for 2 weeks after treatment because this drug could enter the breast milk and cause harm to a breastfeeding baby. . Fertility warning: In men and women both, this drug may affect  your ability to have children in the future. Talk with your doctor or nurse if you plan to have children. Ask for information on sperm or egg banking. [facility] Discharge Instructions for Patients Receiving Chemotherapy   Beginning January 23rd 2017 lab work for the [facility] will be done in the  Main lab at [facility] on 1st floor. If you have a lab appointment with the [facility] please come in thru the  Main Entrance and check in at the main information desk   Today you received the following chemotherapy agents Carfilzomib. Follow-up as scheduled. Call clinic for any questions or concerns  To help prevent nausea and vomiting after your treatment, we encourage you to take your nausea medication   If you develop nausea and vomiting, or diarrhea that is not controlled by your medication, call the clinic.  The clinic phone number is [phone]  [phone]. Office hours are Monday-Friday 8:30am-5:00pm.  BELOW ARE SYMPTOMS THAT SHOULD BE REPORTED IMMEDIATELY:  *FEVER GREATER THAN 101.0 F  *CHILLS WITH OR WITHOUT FEVER  NAUSEA AND VOMITING THAT IS NOT CONTROLLED WITH YOUR NAUSEA MEDICATION  *UNUSUAL SHORTNESS OF BREATH  *UNUSUAL BRUISING OR BLEEDING  TENDERNESS IN MOUTH AND THROAT WITH OR WITHOUT PRESENCE OF ULCERS  *URINARY PROBLEMS  *BOWEL PROBLEMS  UNUSUAL RASH Items with * indicate a potential emergency and should be followed up as soon as possible. If you have an emergency after office hours please contact your primary care physician or go to the nearest emergency department.  Please call the clinic during office hours if you have any questions or concerns.   You may also contact the Patient Navigator at [phone] should you have any questions or need assistance in obtaining follow up care.      Resources For Cancer Patients and their Caregivers ? American [employer] Society: Can assist with transportation, wigs, general needs, runs Look Good Feel  Better.        223-041-8557 ? Cancer Care: Provides financial assistance, online support groups, medication/co-pay assistance.  1-800-813-HOPE (680) 020-4482) ? [place] Assists [place] cancer patients and their families through emotional , educational and financial support.  631-297-9642 ? [place] DSS Where to apply for food stamps, Medicaid and utility assistance. (831)647-7883 ? [facility]: Transportation to medical appointments. 403-378-9611 ? Social Security Administration: May apply for disability if have a Stage IV cancer. (631)067-9419 814 774 6737 ? [employer], Disability and Transit Services: Assists with nutrition, care and transit needs. (909)345-4275

## 2019-04-21 ENCOUNTER — Inpatient Hospital Stay (HOSPITAL_COMMUNITY): Payer: Medicare Other

## 2019-04-21 ENCOUNTER — Encounter (HOSPITAL_COMMUNITY): Payer: Self-pay | Admitting: Hematology

## 2019-04-21 ENCOUNTER — Ambulatory Visit (HOSPITAL_COMMUNITY): Payer: Medicare Other

## 2019-04-21 ENCOUNTER — Ambulatory Visit (HOSPITAL_COMMUNITY): Payer: Medicare Other | Admitting: Hematology

## 2019-04-21 ENCOUNTER — Other Ambulatory Visit (HOSPITAL_COMMUNITY): Payer: Medicare Other

## 2019-04-21 ENCOUNTER — Encounter (HOSPITAL_COMMUNITY): Payer: Self-pay | Admitting: *Deleted

## 2019-04-21 ENCOUNTER — Other Ambulatory Visit: Payer: Self-pay

## 2019-04-21 ENCOUNTER — Inpatient Hospital Stay (HOSPITAL_BASED_OUTPATIENT_CLINIC_OR_DEPARTMENT_OTHER): Payer: Medicare Other | Admitting: Hematology

## 2019-04-21 VITALS — BP 126/79 | HR 70 | Temp 96.8°F | Resp 18

## 2019-04-21 DIAGNOSIS — Z5112 Encounter for antineoplastic immunotherapy: Secondary | ICD-10-CM | POA: Diagnosis not present

## 2019-04-21 DIAGNOSIS — C9000 Multiple myeloma not having achieved remission: Secondary | ICD-10-CM | POA: Diagnosis not present

## 2019-04-21 DIAGNOSIS — N185 Chronic kidney disease, stage 5: Secondary | ICD-10-CM

## 2019-04-21 LAB — CBC WITH DIFFERENTIAL/PLATELET
Abs Immature Granulocytes: 0.02 10*3/uL (ref 0.00–0.07)
Basophils Absolute: 0 10*3/uL (ref 0.0–0.1)
Basophils Relative: 1 %
Eosinophils Absolute: 0.3 10*3/uL (ref 0.0–0.5)
Eosinophils Relative: 4 %
HCT: 33.2 % — ABNORMAL LOW (ref 39.0–52.0)
Hemoglobin: 11 g/dL — ABNORMAL LOW (ref 13.0–17.0)
Immature Granulocytes: 0 %
Lymphocytes Relative: 11 %
Lymphs Abs: 0.6 10*3/uL — ABNORMAL LOW (ref 0.7–4.0)
MCH: 35.9 pg — ABNORMAL HIGH (ref 26.0–34.0)
MCHC: 33.1 g/dL (ref 30.0–36.0)
MCV: 108.5 fL — ABNORMAL HIGH (ref 80.0–100.0)
Monocytes Absolute: 0.7 10*3/uL (ref 0.1–1.0)
Monocytes Relative: 11 %
Neutro Abs: 4.4 10*3/uL (ref 1.7–7.7)
Neutrophils Relative %: 73 %
Platelets: 270 10*3/uL (ref 150–400)
RBC: 3.06 MIL/uL — ABNORMAL LOW (ref 4.22–5.81)
RDW: 14.6 % (ref 11.5–15.5)
WBC: 6 10*3/uL (ref 4.0–10.5)
nRBC: 0 % (ref 0.0–0.2)

## 2019-04-21 LAB — COMPREHENSIVE METABOLIC PANEL
ALT: 10 U/L (ref 0–44)
AST: 12 U/L — ABNORMAL LOW (ref 15–41)
Albumin: 4.2 g/dL (ref 3.5–5.0)
Alkaline Phosphatase: 74 U/L (ref 38–126)
Anion gap: 11 (ref 5–15)
BUN: 50 mg/dL — ABNORMAL HIGH (ref 6–20)
CO2: 25 mmol/L (ref 22–32)
Calcium: 10 mg/dL (ref 8.9–10.3)
Chloride: 100 mmol/L (ref 98–111)
Creatinine, Ser: 5.15 mg/dL — ABNORMAL HIGH (ref 0.61–1.24)
GFR calc Af Amer: 13 mL/min — ABNORMAL LOW (ref >60)
GFR calc non Af Amer: 11 mL/min — ABNORMAL LOW (ref >60)
Glucose, Bld: 101 mg/dL — ABNORMAL HIGH (ref 70–99)
Potassium: 4.5 mmol/L (ref 3.5–5.1)
Sodium: 136 mmol/L (ref 135–145)
Total Bilirubin: 0.7 mg/dL (ref 0.3–1.2)
Total Protein: 7.2 g/dL (ref 6.5–8.1)

## 2019-04-21 LAB — PHOSPHORUS: Phosphorus: 4.9 mg/dL — ABNORMAL HIGH (ref 2.5–4.6)

## 2019-04-21 LAB — MAGNESIUM: Magnesium: 2.2 mg/dL (ref 1.7–2.4)

## 2019-04-21 LAB — LACTATE DEHYDROGENASE: LDH: 174 U/L (ref 98–192)

## 2019-04-21 MED ORDER — PROCHLORPERAZINE MALEATE 10 MG PO TABS
10.0000 mg | ORAL_TABLET | Freq: Once | ORAL | Status: AC
  Administered 2019-04-21: 10 mg via ORAL
  Filled 2019-04-21: qty 1

## 2019-04-21 MED ORDER — SODIUM CHLORIDE 0.9 % IV SOLN: Freq: Once | INTRAVENOUS | Status: AC

## 2019-04-21 MED ORDER — HEPARIN SOD (PORK) LOCK FLUSH 100 UNIT/ML IV SOLN
500.0000 [IU] | Freq: Once | INTRAVENOUS | Status: AC | PRN
  Administered 2019-04-21: 500 [IU]

## 2019-04-21 MED ORDER — SODIUM CHLORIDE 0.9% FLUSH
10.0000 mL | INTRAVENOUS | Status: DC | PRN
  Administered 2019-04-21: 10 mL

## 2019-04-21 MED ORDER — DEXTROSE 5 % IV SOLN
20.0000 mg/m2 | Freq: Once | INTRAVENOUS | Status: AC
  Administered 2019-04-21: 40 mg via INTRAVENOUS
  Filled 2019-04-21: qty 5

## 2019-04-21 MED ORDER — SODIUM CHLORIDE 0.9 % IV SOLN
40.0000 mg | Freq: Once | INTRAVENOUS | Status: AC
  Administered 2019-04-21: 40 mg via INTRAVENOUS
  Filled 2019-04-21: qty 4

## 2019-04-21 NOTE — Progress Notes (Signed)
3943 Labs,including BUN 50,Creatinine 5.15 and Phosphorus 4.9, reviewed with Dr. Delton Coombes and pt approved for Kyprolis infusion today but we are holding Retacrit injection for Hgb of 11 today per MD                                                                                    [name] tolerated Carfilzomib infusion well without complaints or incident. VSS upon discharge. Pt discharged self ambulatory in satisfactory condition

## 2019-04-21 NOTE — Progress Notes (Signed)
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[place])  06/16/2018 Initial Diagnosis   Kappa light chain myeloma ([place])   06/17/2018 - 04/10/2019 Chemotherapy   The patient had palonosetron (ALOXI) injection 0.25 mg, 0.25 mg, Intravenous,  Once, 27 of 27 cycles Administration: 0.25 mg (06/17/2018), 0.25 mg (06/23/2018), 0.25 mg (06/30/2018), 0.25 mg (07/07/2018), 0.25 mg (07/14/2018), 0.25 mg (07/22/2018), 0.25 mg (07/29/2018), 0.25 mg (08/06/2018), 0.25 mg (08/13/2018), 0.25 mg (08/20/2018), 0.25 mg (08/27/2018), 0.25 mg (09/06/2018), 0.25 mg (09/13/2018), 0.25 mg (09/20/2018), 0.25 mg (09/27/2018), 0.25 mg (10/04/2018), 0.25 mg (10/11/2018), 0.25 mg (10/18/2018), 0.25 mg (10/25/2018), 0.25 mg (11/01/2018), 0.25 mg (11/08/2018), 0.25 mg (11/15/2018), 0.25 mg (11/22/2018), 0.25 mg (11/29/2018), 0.25 mg (12/08/2018), 0.25 mg (12/16/2018) bortezomib SQ (VELCADE) chemo injection 2.5 mg, 1.3 mg/m2 = 2.5 mg, Subcutaneous,  Once, 41 of 42 cycles Administration: 2.5 mg (06/17/2018), 2.5 mg (06/23/2018), 2.5 mg (06/30/2018), 2.5 mg (07/07/2018), 2.5 mg (07/14/2018), 2.5 mg (07/22/2018), 2.5 mg (07/29/2018), 2.5 mg (08/06/2018), 2.5 mg (08/13/2018), 2.5 mg (08/20/2018), 2.5 mg (08/27/2018), 2.5 mg (09/06/2018), 2.5 mg (09/13/2018), 2.5 mg (09/20/2018), 2.5 mg (09/27/2018), 2.5 mg (10/04/2018), 2.5 mg (10/11/2018), 2.5 mg (10/18/2018), 2.5 mg (10/25/2018), 2.5 mg (11/01/2018), 2.5 mg (11/08/2018), 2.5 mg (11/15/2018), 2.5 mg (11/22/2018), 2.5 mg (11/29/2018), 2.5 mg (12/08/2018), 2.5 mg (12/16/2018), 2.5 mg (12/27/2018), 2.5 mg (01/03/2019), 2.5 mg (01/10/2019), 2.5 mg (01/17/2019), 2.5 mg (01/24/2019), 2.5 mg (01/31/2019), 2.5 mg (02/07/2019), 2.5 mg (02/14/2019), 2.5 mg (02/21/2019), 2.5 mg (02/28/2019), 2.5  mg (03/07/2019), 2.5 mg (03/14/2019), 2.5 mg (03/21/2019), 2.5 mg (03/29/2019), 2.5 mg (04/04/2019) cyclophosphamide (CYTOXAN) 300 mg in sodium chloride 0.9 % 250 mL chemo infusion, 300 mg (100 % of original dose 300 mg), Intravenous,  Once, 26 of 26 cycles Dose modification: 300 mg (original dose 300 mg, Cycle 1, Reason: Change in SCr/CrCl), 500 mg (original dose 500 mg, Cycle 2, Reason: Provider Judgment), 500 mg (original dose 500 mg, Cycle 3, Reason: Change in SCr/CrCl) Administration: 300 mg (06/17/2018), 500 mg (06/23/2018), 500 mg (06/30/2018), 600 mg (07/07/2018), 600 mg (07/14/2018), 600 mg (07/22/2018), 600 mg (07/29/2018), 600 mg (08/06/2018), 600 mg (08/13/2018), 600 mg (08/20/2018), 600 mg (08/27/2018), 600 mg (09/06/2018), 600 mg (09/13/2018), 600 mg (09/20/2018), 600 mg (09/27/2018), 600 mg (10/04/2018), 600 mg (10/11/2018), 600 mg (10/18/2018), 600 mg (10/25/2018), 600 mg (11/01/2018), 600 mg (11/08/2018), 600 mg (11/15/2018), 600 mg (11/22/2018), 600 mg (11/29/2018), 600 mg (12/08/2018), 600 mg (12/16/2018)  for chemotherapy treatment.    04/21/2019 -  Chemotherapy   The patient had carfilzomib (KYPROLIS) 40 mg in dextrose 5 % 50 mL chemo infusion, 20 mg/m2 = 40 mg, Intravenous,  Once, 1 of 6 cycles Dose modification: 27 mg/m2 (original dose 27 mg/m2, Cycle 1, Reason: Provider Judgment) Administration: 40 mg (04/21/2019), 40 mg (04/22/2019)  for chemotherapy treatment.       INTERVAL HISTORY:  [name] 59 y.o. male seen for follow-up of multiple myeloma.  Numbness in the bottom of the feet has been stable.  He reports worsening of pain in his back.  He is having to take occasionally 2 tablets at 1 time of Dilaudid.  He has not received Pomalyst yet.  Denies any nausea, vomiting or diarrhea or constipation.  Denies any fevers or night  sweats.  REVIEW OF SYSTEMS:  Review of Systems  Musculoskeletal: Positive for back pain.  Neurological: Positive for numbness.  All other systems reviewed and are  negative.    PAST MEDICAL/SURGICAL HISTORY:  Past Medical History:  Diagnosis Date  . Allergy   . Arthritis    neck and back  . Blood transfusion without reported diagnosis   . BPH (benign prostatic hyperplasia)   . Cancer [facility]) 2004   testicle  . Chronic kidney disease    kidney stones  . Left lumbar radiculopathy 06/12/2016  . 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 Medical history non-contributory    Pt has scattered thoughts and uncertain of past medical history  . 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 Stones, urinary tract   . Substance abuse ([place])    prescribed oxydcodone- 10 years   Past Surgical History:  Procedure Laterality Date  . BACK SURGERY     x5  . CERVICAL DISC SURGERY     x2  . COLONOSCOPY WITH PROPOFOL N/A 08/11/2016   Dr. Gala Romney: non-bleeding internal hemorrhoids, one 4 mm hyperplastic rectal polyp, diverticulosis in entire examined colon  . POLYPECTOMY  08/11/2016   Procedure: POLYPECTOMY;  Surgeon: [name], MD;  Location: [facility] ENDO SUITE;  Service: Endoscopy;;  colon  . PORTACATH PLACEMENT Left 09/20/2018   Procedure: INSERTION PORT-A-CATH (catheter attached left subclavian);  Surgeon: Virl Cagey, MD;  Location: [facility] ORS;  Service: General;  Laterality: Left;  . testicular cancer  2004  . TONSILLECTOMY       SOCIAL HISTORY:  Social History   Socioeconomic History  . Marital status: Single    Spouse name: Not on file  . Number of children: 1  . Years of education: 40  . Highest education level: Not on file  Occupational History  . Occupation: retired    Comment: Psychologist, prison and probation services  . Occupation: disabled  Tobacco Use  . Smoking status: Current Every Day Smoker    Packs/day: 1.00    Years: 40.00    Pack years: 40.00    Types: Cigarettes    Start date: 03/31/1974  . Smokeless tobacco: Never Used  Substance and Sexual Activity  . Alcohol use: Yes    Comment: Occasional  . Drug use: No    Types: Cocaine    Comment: Remote hx of cocaine, quit  1989  . Sexual activity: Yes  Other Topics Concern  . Not on file  Social History Narrative   [employer] for 12 years   [employer] for 71 years      Never married   One daughter      Lives alone   Retail banker   Right-handed   Occasional caffeine use         Social Determinants of Health   Financial Resource Strain:   . Difficulty of Paying Living Expenses: Not on file  Food Insecurity:   . Worried About Charity fundraiser in the Last Year: Not on file  . Ran Out of Food in the Last Year: Not on file  Transportation Needs:   . Lack of Transportation (Medical): Not on file  . Lack of Transportation (Non-Medical): Not on file  Physical Activity:   . Days of Exercise per Week: Not on file  . Minutes of Exercise per Session: Not on file  Stress:   . Feeling of Stress : Not on file  Social Connections:   . Frequency of Communication with Friends and Family:  Not on file  . Frequency of Social Gatherings with Friends and Family: Not on file  . Attends Religious Services: Not on file  . Active Member of Clubs or Organizations: Not on file  . Attends Archivist Meetings: Not on file  . Marital Status: Not on file  Intimate Partner Violence: Not At Risk  . Fear of Current or Ex-Partner: No  . Emotionally Abused: No  . Physically Abused: No  . Sexually Abused: No    FAMILY HISTORY:  Family History  Problem Relation Age of Onset  . COPD Mother   . Heart disease Mother   . Other Father        Never knew his father.  . Thyroid disease Sister   . Arthritis Sister   . Heart disease Sister        bypass  . Colon cancer Neg Hx     CURRENT MEDICATIONS:  Outpatient Encounter Medications as of 04/21/2019  Medication Sig  . bortezomib IV (VELCADE) 3.5 MG injection Inject 1.3 mg/m2 into the vein once a week.   . calcitRIOL (ROCALTROL) 0.5 MCG capsule Take 0.5 mcg by mouth daily.   Marland Kitchen CARFILZOMIB IV Inject into the vein.  . CYCLOPHOSPHAMIDE IV  Inject into the vein once a week.  Marland Kitchen Epoetin Alfa (PROCRIT IJ) Inject as directed. Unsure of dosage- gets this once weekly  . Patiromer Sorbitex Calcium (VELTASSA PO) Take by mouth 2 (two) times a week.   . pomalidomide (POMALYST) 3 MG capsule Take 1 capsule (3 mg total) by mouth daily.  Marland Kitchen senna (SENOKOT) 8.6 MG tablet Take 1 tablet by mouth 2 (two) times daily.   . sodium bicarbonate 650 MG tablet Take 1,300 mg by mouth 2 (two) times daily.  Marland Kitchen torsemide (DEMADEX) 20 MG tablet TAKING 1 TABLET DAILY, AND 2 TABLETS ONLY IF HIS ANKLES ARE REALLY SWOLLEN  . valACYclovir (VALTREX) 500 MG tablet TAKE 1 TABLET BY MOUTH TWICE A DAY  . HYDROmorphone (DILAUDID) 4 MG tablet Take 1-2 tablets (4-8 mg total) by mouth every 8 (eight) hours as needed for severe pain.  . Misc. Devices MISC Please provide patient with rollaider.  Dx: multiple myeloma C90.0 (Patient not taking: Reported on 04/21/2019)  . [DISCONTINUED] HYDROmorphone (DILAUDID) 4 MG tablet Take 1 tablet (4 mg total) by mouth every 8 (eight) hours as needed for severe pain. (Patient not taking: Reported on 04/21/2019)   No facility-administered encounter medications on file as of 04/21/2019.    ALLERGIES:  Allergies  Allergen Reactions  . Oxycodone-Acetaminophen   . Acetaminophen Nausea Only and Other (See Comments)    Elevated liver enzymes  . Cymbalta [Duloxetine Hcl] Swelling    Facial swelling  . Diclofenac Sodium Swelling       . Diclofenac Sodium Swelling  . Imodium [Loperamide] Swelling    facial  . Lyrica [Pregabalin] Swelling  . Morphine Other (See Comments)    Bradycardia   . Vioxx [Rofecoxib] Swelling  . Aleve [Naproxen] Swelling    eye     PHYSICAL EXAM:  ECOG Performance status: 1  Vitals:   04/21/19 0826  BP: (!) 141/90  Pulse: (!) 102  Resp: 18  Temp: (!) 97.3 F (36.3 C)  SpO2: 99%   Filed Weights   04/21/19 0826  Weight: 177 lb 3.2 oz (80.4 kg)    Physical Exam Vitals reviewed.  Constitutional:       Appearance: Normal appearance. He is normal weight.  Cardiovascular:     Rate  and Rhythm: Normal rate and regular rhythm.     Heart sounds: Normal heart sounds.  Pulmonary:     Effort: Pulmonary effort is normal.     Breath sounds: Normal breath sounds.  Abdominal:     General: Bowel sounds are normal.     Palpations: Abdomen is soft.  Musculoskeletal:        General: Normal range of motion.     Right lower leg: No edema.     Left lower leg: No edema.  Skin:    General: Skin is warm and dry.  Neurological:     Mental Status: He is alert and oriented to person, place, and time. Mental status is at baseline.  Psychiatric:        Mood and Affect: Mood normal.        Behavior: Behavior normal.      LABORATORY DATA:  I have reviewed the labs as listed.  CBC    Component Value Date/Time   WBC 6.0 04/21/2019 0828   RBC 3.06 (L) 04/21/2019 0828   HGB 11.0 (L) 04/21/2019 0828   HCT 33.2 (L) 04/21/2019 0828   PLT 270 04/21/2019 0828   MCV 108.5 (H) 04/21/2019 0828   MCH 35.9 (H) 04/21/2019 0828   MCHC 33.1 04/21/2019 0828   RDW 14.6 04/21/2019 0828   LYMPHSABS 0.6 (L) 04/21/2019 0828   MONOABS 0.7 04/21/2019 0828   EOSABS 0.3 04/21/2019 0828   BASOSABS 0.0 04/21/2019 0828   CMP Latest Ref Rng & Units 04/21/2019 04/13/2019 04/04/2019  Glucose 70 - 99 mg/dL 101(H) 94 100(H)  BUN 6 - 20 mg/dL 50(H) 54(H) 50(H)  Creatinine 0.61 - 1.24 mg/dL 5.15(H) 5.31(H) 5.07(H)  Sodium 135 - 145 mmol/L 136 140 140  Potassium 3.5 - 5.1 mmol/L 4.5 4.6 4.3  Chloride 98 - 111 mmol/L 100 101 102  CO2 22 - 32 mmol/L 25 26 26   Calcium 8.9 - 10.3 mg/dL 10.0 10.0 9.7  Total Protein 6.5 - 8.1 g/dL 7.2 6.9 7.4  Total Bilirubin 0.3 - 1.2 mg/dL 0.7 0.5 0.9  Alkaline Phos 38 - 126 U/L 74 65 67  AST 15 - 41 U/L 12(L) 12(L) 13(L)  ALT 0 - 44 U/L 10 10 8       I have personally reviewed his scans and discussed with him.     ASSESSMENT & PLAN:   Kappa light chain myeloma ([place]) 1.  Kappa light  chain myeloma, high risk, stage III: -Bone marrow biopsy on 06/16/2018 shows 87% plasma cells.  Cytogenetics shows complex chromosome abnormalities.  Gain of 1 q. present.  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 -Last myeloma panel on 03/29/2019 shows kappa light chains to 23 increased from 150.  Ratio increased to 7.57 from 4.57. -Patient reluctant to consider transplant at this time. -I have changed his regimen to carfilzomib, pomalidomide and dexamethasone.  We are in the process of getting authorization for his [place] initially declined it. -We talked about side effects of carfilzomib in detail.  We will also try to obtain baseline 2D echocardiogram. -Reviewed his labs.  He will proceed with first dose of carfilzomib today.  We will start him at low-dose of 20 mg per metered squared and increase it next week.  Elevate him in 1 week.  2.  Hypokalemia: -Potassium today is 4.5.  He will continue Veltassa.  3.  Anemia: -Etiology CKD and myeloma.  He will receive Retacrit as needed.  4.  ID prophylaxis: -He will continue Valtrex twice daily.  5.  Low back pain: -This is slightly worsened recently.  Complaining of pain in the lower back and left hip region. -We will increase Dilaudid to 4 mg 2 tablets every 8 hours. -I have ordered MRI of his back.  Will be done on 04/28/2019.  6.  Leg swelling: -He will continue torsemide 20 mg as needed.  Total time spent is 30 minutes with more than 50% of the time spent face-to-face discussing change in treatment plan, counseling and coordination of care.  Orders placed this encounter:  No orders of the defined types were placed in this encounter.     Derek Jack, MD [place] [phone]

## 2019-04-21 NOTE — Patient Instructions (Signed)
[facility] at [facility] Discharge Instructions  Labs drawn from portacath today   Thank you for choosing [facility] at [facility] to provide your oncology and hematology care.  To afford each patient quality time with our provider, please arrive at least 15 minutes before your scheduled appointment time.   If you have a lab appointment with the Cancer Center please come in thru the Main Entrance and check in at the main information desk.  You need to re-schedule your appointment should you arrive 10 or more minutes late.  We strive to give you quality time with our providers, and arriving late affects you and other patients whose appointments are after yours.  Also, if you no show three or more times for appointments you may be dismissed from the clinic at the providers discretion.     Again, thank you for choosing [facility].  Our hope is that these requests will decrease the amount of time that you wait before being seen by our physicians.       _____________________________________________________________  Should you have questions after your visit to [facility], please contact our office at [phone] between the hours of 8:00 a.m. and 4:30 p.m.  Voicemails left after 4:00 p.m. will not be returned until the following business day.  For prescription refill requests, have your pharmacy contact our office and allow 72 hours.    Due to Covid, you will need to wear a mask upon entering the hospital. If you do not have a mask, a mask will be given to you at the Main Entrance upon arrival. For doctor visits, patients may have 1 support person with them. For treatment visits, patients can not have anyone with them due to social distancing guidelines and our immunocompromised population.     

## 2019-04-21 NOTE — Patient Instructions (Addendum)
[place] at [facility] Discharge Instructions  You were seen today by Dr. Delton Coombes. He went over your recent lab results. You will have your first treatment with the new medication today. He will see you back in 1 week for labs and follow up.   Thank you for choosing [place] at [facility] to provide your oncology and hematology care.  To afford each patient quality time with our provider, please arrive at least 15 minutes before your scheduled appointment time.   If you have a lab appointment with the [facility] please come in thru the  Main Entrance and check in at the main information desk  You need to re-schedule your appointment should you arrive 10 or more minutes late.  We strive to give you quality time with our providers, and arriving late affects you and other patients whose appointments are after yours.  Also, if you no show three or more times for appointments you may be dismissed from the clinic at the providers discretion.     Again, thank you for choosing [facility] .  Our hope is that these requests will decrease the amount of time that you wait before being seen by our physicians.       _____________________________________________________________  Should you have questions after your visit to [facility], please contact our office at [phone] between the hours of 8:00 a.m. and 4:30 p.m.  Voicemails left after 4:00 p.m. will not be returned until the following business day.  For prescription refill requests, have your pharmacy contact our office and allow 72 hours.    Cancer Center Support Programs:   > Cancer Support Group  2nd Tuesday of the month 1pm-2pm, Journey Room

## 2019-04-21 NOTE — Progress Notes (Signed)
Patient has been assessed, vital signs and labs have been reviewed by Dr. Katragadda. ANC, Creatinine, LFTs, and Platelets are within treatment parameters per Dr. Katragadda. The patient is good to proceed with treatment at this time.  

## 2019-04-21 NOTE — Progress Notes (Addendum)
I spoke with Stanton Kidney at [employer] and gave her the [place] # [phone]) for his Pomalyst.  She reports that he has a $65 copay and she will reach out to the patient to set up delivery.

## 2019-04-22 ENCOUNTER — Encounter (HOSPITAL_COMMUNITY): Payer: Self-pay

## 2019-04-22 ENCOUNTER — Inpatient Hospital Stay (HOSPITAL_COMMUNITY): Payer: Medicare Other

## 2019-04-22 VITALS — BP 147/93 | HR 77 | Temp 96.9°F | Resp 18

## 2019-04-22 DIAGNOSIS — Z5112 Encounter for antineoplastic immunotherapy: Secondary | ICD-10-CM | POA: Diagnosis not present

## 2019-04-22 DIAGNOSIS — C9000 Multiple myeloma not having achieved remission: Secondary | ICD-10-CM

## 2019-04-22 MED ORDER — SODIUM CHLORIDE 0.9% FLUSH
10.0000 mL | INTRAVENOUS | Status: DC | PRN
  Administered 2019-04-22: 10 mL

## 2019-04-22 MED ORDER — HEPARIN SOD (PORK) LOCK FLUSH 100 UNIT/ML IV SOLN
500.0000 [IU] | Freq: Once | INTRAVENOUS | Status: AC | PRN
  Administered 2019-04-22: 500 [IU]

## 2019-04-22 MED ORDER — DEXTROSE 5 % IV SOLN
20.0000 mg/m2 | Freq: Once | INTRAVENOUS | Status: AC
  Administered 2019-04-22: 40 mg via INTRAVENOUS
  Filled 2019-04-22: qty 15

## 2019-04-22 MED ORDER — PROCHLORPERAZINE MALEATE 10 MG PO TABS: 10.0000 mg | ORAL_TABLET | Freq: Once | ORAL | Status: DC

## 2019-04-22 MED ORDER — PROCHLORPERAZINE MALEATE 10 MG PO TABS
10.0000 mg | ORAL_TABLET | Freq: Once | ORAL | Status: AC
  Administered 2019-04-22: 10 mg via ORAL
  Filled 2019-04-22: qty 1

## 2019-04-22 MED ORDER — SODIUM CHLORIDE 0.9 % IV SOLN: Freq: Once | INTRAVENOUS | Status: AC

## 2019-04-22 NOTE — Progress Notes (Signed)
[name] tolerated [name] infusion well without complaints or incident. Pt instructed to start taking Aspirin 81 mg tab PO daily per Dr. Tomie China order with understanding verbalized. VSS upon discharge. Pt discharged self ambulatory in satisfactory condition

## 2019-04-22 NOTE — Patient Instructions (Signed)
[facility] Discharge Instructions for Patients Receiving Chemotherapy   Beginning January 23rd 2017 lab work for the Cancer Center will be done in the  Main lab at [facility] on 1st floor. If you have a lab appointment with the Cancer Center please come in thru the  Main Entrance and check in at the main information desk   Today you received the following chemotherapy agents Kyprolis. Follow-up as scheduled. Call clinic for any questions or concerns  To help prevent nausea and vomiting after your treatment, we encourage you to take your nausea medication   If you develop nausea and vomiting, or diarrhea that is not controlled by your medication, call the clinic.  The clinic phone number is [phone]. Office hours are Monday-Friday 8:30am-5:00pm.  BELOW ARE SYMPTOMS THAT SHOULD BE REPORTED IMMEDIATELY:  *FEVER GREATER THAN 101.0 F  *CHILLS WITH OR WITHOUT FEVER  NAUSEA AND VOMITING THAT IS NOT CONTROLLED WITH YOUR NAUSEA MEDICATION  *UNUSUAL SHORTNESS OF BREATH  *UNUSUAL BRUISING OR BLEEDING  TENDERNESS IN MOUTH AND THROAT WITH OR WITHOUT PRESENCE OF ULCERS  *URINARY PROBLEMS  *BOWEL PROBLEMS  UNUSUAL RASH Items with * indicate a potential emergency and should be followed up as soon as possible. If you have an emergency after office hours please contact your primary care physician or go to the nearest emergency department.  Please call the clinic during office hours if you have any questions or concerns.   You may also contact the Patient Navigator at [phone] should you have any questions or need assistance in obtaining follow up care.      Resources For Cancer Patients and their Caregivers ? American Cancer Society: Can assist with transportation, wigs, general needs, runs Look Good Feel Better.        1-888-227-6333 ? Cancer Care: Provides financial assistance, online support groups, medication/co-pay assistance.  1-800-813-HOPE  (4673) ? [facility] Assists [place] cancer patients and their families through emotional , educational and financial support.  336-427-4357 ? [place] DSS Where to apply for food stamps, Medicaid and utility assistance. 336-342-1394 ? [facility]: Transportation to medical appointments. 336-347-2287 ? Social Security Administration: May apply for disability if have a Stage IV cancer. 336-342-7796 1-800-772-1213 ? [place] Aging, Disability and Transit Services: Assists with nutrition, care and transit needs. 336-349-2343         

## 2019-04-24 MED ORDER — HYDROMORPHONE HCL 4 MG PO TABS: 4.0000 mg | ORAL_TABLET | Freq: Three times a day (TID) | ORAL | 0 refills | Status: DC | PRN

## 2019-04-24 NOTE — Assessment & Plan Note (Signed)
1.  Kappa light chain myeloma, high risk, stage III: -Bone marrow biopsy on 06/16/2018 shows 87% plasma cells.  Cytogenetics shows complex chromosome abnormalities.  Gain of 1 q. present.  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 -Last myeloma panel on 03/29/2019 shows kappa light chains to 23 increased from 150.  Ratio increased to 7.57 from 4.57. -Patient reluctant to consider transplant at this time. -I have changed his regimen to carfilzomib, pomalidomide and dexamethasone.  We are in the process of getting authorization for his Pomalyst.  Insurance company initially declined it. -We talked about side effects of carfilzomib in detail.  We will also try to obtain baseline 2D echocardiogram. -Reviewed his labs.  He will proceed with first dose of carfilzomib today.  We will start him at low-dose of 20 mg per metered squared and increase it next week.  Elevate him in 1 week.  2.  Hypokalemia: -Potassium today is 4.5.  He will continue Veltassa.  3.  Anemia: -Etiology CKD and myeloma.  He will receive Retacrit as needed.  4.  ID prophylaxis: -He will continue Valtrex twice daily.  5.  Low back pain: -This is slightly worsened recently.  Complaining of pain in the lower back and left hip region. -We will increase Dilaudid to 4 mg 2 tablets every 8 hours. -I have ordered MRI of his back.  Will be done on 04/28/2019.  6.  Leg swelling: -He will continue torsemide 20 mg as needed. 

## 2019-04-25 NOTE — Progress Notes (Signed)
  24 Hour call back completed. Patient has no complaints of any vomiting or diarrhea. Slight nausea per patient's words. Patient teaching performed pertaining to increase fluids and stay hydrated and taking nausea medication as prescribed.  Patient had complaints of back pain which he had on Friday prior to treatment and Dr. Delton Coombes aware. Patient states his pain medication was not refilled by the pharmacy due to it's too early for a refill.

## 2019-04-26 ENCOUNTER — Other Ambulatory Visit (HOSPITAL_COMMUNITY): Payer: Self-pay | Admitting: *Deleted

## 2019-04-26 ENCOUNTER — Encounter (HOSPITAL_COMMUNITY): Payer: Self-pay | Admitting: *Deleted

## 2019-04-26 NOTE — Progress Notes (Signed)
Patient asked that I call and talk to his daughter.  I educated her a lot today about cancer pain and keeping it under control.  I talked about him maybe getting some response after starting the pomalyst but we wouldn't know immediately, it will take a while to see a response.  I told her too that we wont know anything about his back until he gets his scans this Thursday.  Once we know what it is, we can treat it.  I provided a lot of answer to questions that she has about his health.  I have also asked Dr. Delton Coombes to call [name] to discuss this as well.   [name] verbalizes understanding of all discussed.  She just has some concerns about his overall health, physically and mentally. She wants him to continue fighting but she is fearful that we will call her and tell her that there is nothing further that we can do.  I explained that Dr. Delton Coombes will be able to further explain these things when they talk. She voices her understanding.

## 2019-04-27 ENCOUNTER — Other Ambulatory Visit (HOSPITAL_COMMUNITY): Payer: Self-pay | Admitting: Hematology

## 2019-04-28 ENCOUNTER — Ambulatory Visit (HOSPITAL_COMMUNITY)
Admission: RE | Admit: 2019-04-28 | Discharge: 2019-04-28 | Disposition: A | Discharge status: Home / Self Care | Payer: Medicare Other | Source: Ambulatory Visit | Attending: Hematology | Admitting: Hematology

## 2019-04-28 ENCOUNTER — Other Ambulatory Visit: Payer: Self-pay

## 2019-04-28 ENCOUNTER — Inpatient Hospital Stay (HOSPITAL_COMMUNITY): Payer: Medicare Other

## 2019-04-28 ENCOUNTER — Inpatient Hospital Stay (HOSPITAL_BASED_OUTPATIENT_CLINIC_OR_DEPARTMENT_OTHER): Payer: Medicare Other | Admitting: Hematology

## 2019-04-28 ENCOUNTER — Encounter (HOSPITAL_COMMUNITY): Payer: Self-pay | Admitting: Hematology

## 2019-04-28 DIAGNOSIS — C9000 Multiple myeloma not having achieved remission: Secondary | ICD-10-CM | POA: Diagnosis not present

## 2019-04-28 DIAGNOSIS — N185 Chronic kidney disease, stage 5: Secondary | ICD-10-CM

## 2019-04-28 DIAGNOSIS — Z5112 Encounter for antineoplastic immunotherapy: Secondary | ICD-10-CM | POA: Diagnosis not present

## 2019-04-28 LAB — CBC WITH DIFFERENTIAL/PLATELET
Abs Immature Granulocytes: 0.01 10*3/uL (ref 0.00–0.07)
Basophils Absolute: 0 10*3/uL (ref 0.0–0.1)
Basophils Relative: 0 %
Eosinophils Absolute: 0.3 10*3/uL (ref 0.0–0.5)
Eosinophils Relative: 4 %
HCT: 30.7 % — ABNORMAL LOW (ref 39.0–52.0)
Hemoglobin: 10.3 g/dL — ABNORMAL LOW (ref 13.0–17.0)
Immature Granulocytes: 0 %
Lymphocytes Relative: 15 %
Lymphs Abs: 1 10*3/uL (ref 0.7–4.0)
MCH: 36.1 pg — ABNORMAL HIGH (ref 26.0–34.0)
MCHC: 33.6 g/dL (ref 30.0–36.0)
MCV: 107.7 fL — ABNORMAL HIGH (ref 80.0–100.0)
Monocytes Absolute: 1 10*3/uL (ref 0.1–1.0)
Monocytes Relative: 14 %
Neutro Abs: 4.6 10*3/uL (ref 1.7–7.7)
Neutrophils Relative %: 67 %
Platelets: 203 10*3/uL (ref 150–400)
RBC: 2.85 MIL/uL — ABNORMAL LOW (ref 4.22–5.81)
RDW: 14.4 % (ref 11.5–15.5)
WBC: 6.9 10*3/uL (ref 4.0–10.5)
nRBC: 0 % (ref 0.0–0.2)

## 2019-04-28 LAB — COMPREHENSIVE METABOLIC PANEL
ALT: 9 U/L (ref 0–44)
AST: 13 U/L — ABNORMAL LOW (ref 15–41)
Albumin: 4.1 g/dL (ref 3.5–5.0)
Alkaline Phosphatase: 68 U/L (ref 38–126)
Anion gap: 11 (ref 5–15)
BUN: 66 mg/dL — ABNORMAL HIGH (ref 6–20)
CO2: 24 mmol/L (ref 22–32)
Calcium: 9.5 mg/dL (ref 8.9–10.3)
Chloride: 99 mmol/L (ref 98–111)
Creatinine, Ser: 4.98 mg/dL — ABNORMAL HIGH (ref 0.61–1.24)
GFR calc Af Amer: 14 mL/min — ABNORMAL LOW (ref >60)
GFR calc non Af Amer: 12 mL/min — ABNORMAL LOW (ref >60)
Glucose, Bld: 125 mg/dL — ABNORMAL HIGH (ref 70–99)
Potassium: 3.9 mmol/L (ref 3.5–5.1)
Sodium: 134 mmol/L — ABNORMAL LOW (ref 135–145)
Total Bilirubin: 0.5 mg/dL (ref 0.3–1.2)
Total Protein: 6.9 g/dL (ref 6.5–8.1)

## 2019-04-28 LAB — PHOSPHORUS: Phosphorus: 5.1 mg/dL — ABNORMAL HIGH (ref 2.5–4.6)

## 2019-04-28 LAB — MAGNESIUM: Magnesium: 2.3 mg/dL (ref 1.7–2.4)

## 2019-04-28 MED ORDER — SODIUM CHLORIDE 0.9% FLUSH
10.0000 mL | INTRAVENOUS | Status: DC | PRN
  Administered 2019-04-28: 10 mL

## 2019-04-28 MED ORDER — PROCHLORPERAZINE MALEATE 10 MG PO TABS
10.0000 mg | ORAL_TABLET | Freq: Once | ORAL | Status: AC
  Administered 2019-04-28: 10 mg via ORAL
  Filled 2019-04-28: qty 1

## 2019-04-28 MED ORDER — SODIUM CHLORIDE 0.9 % IV SOLN: Freq: Once | INTRAVENOUS | Status: AC

## 2019-04-28 MED ORDER — SODIUM CHLORIDE 0.9 % IV SOLN
40.0000 mg | Freq: Once | INTRAVENOUS | Status: AC
  Administered 2019-04-28: 40 mg via INTRAVENOUS
  Filled 2019-04-28: qty 4

## 2019-04-28 MED ORDER — EPOETIN ALFA-EPBX 10000 UNIT/ML IJ SOLN
20000.0000 [IU] | Freq: Once | INTRAMUSCULAR | Status: AC
  Administered 2019-04-28: 20000 [IU] via SUBCUTANEOUS
  Filled 2019-04-28: qty 2

## 2019-04-28 MED ORDER — HEPARIN SOD (PORK) LOCK FLUSH 100 UNIT/ML IV SOLN
500.0000 [IU] | Freq: Once | INTRAVENOUS | Status: AC | PRN
  Administered 2019-04-28: 500 [IU]

## 2019-04-28 MED ORDER — PROCHLORPERAZINE MALEATE 5 MG PO TABS: 5.0000 mg | ORAL_TABLET | Freq: Four times a day (QID) | ORAL | 2 refills | Status: DC | PRN

## 2019-04-28 MED ORDER — DEXTROSE 5 % IV SOLN
29.0000 mg/m2 | Freq: Once | INTRAVENOUS | Status: AC
  Administered 2019-04-28: 60 mg via INTRAVENOUS
  Filled 2019-04-28: qty 30

## 2019-04-28 NOTE — Progress Notes (Signed)
Patient has been assessed, vital signs and labs have been reviewed by Dr. Katragadda. ANC, Creatinine, LFTs, and Platelets are within treatment parameters per Dr. Katragadda. The patient is good to proceed with treatment at this time.  

## 2019-04-28 NOTE — Assessment & Plan Note (Addendum)
1.  Kappa light chain myeloma, high risk, stage III: -Bone marrow biopsy on 06/16/2018 shows 87% plasma cells.  Cytogenetics shows complex chromosome abnormalities.  Gain of 1 q. present.  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 -Last myeloma panel on 03/29/2019 shows kappa light chains to 23 increased from 150.  Ratio increased to 7.57 from 4.57. -He was reluctant to consider transplant at this time. -We started him on carfilzomib day 1 and 2 on 04/21/2019.  He experienced some nausea but denied any vomiting. -He received Pomalyst 3 mg tablets today.  I have told him to start taking it tonight.  We talked about side effects in detail. -He will continue Pomalyst for 14 days until 05/10/2020 synchronized with this cycle 1.  After this cycle he will have 3 weeks on 1 week off. -His phosphate has gone up slightly to 5.1.  We will keep a close eye on it.  If it continues to be high, will consider PhosLo. -I will send a prescription for Compazine 5 mg every 6 hours as needed.  We will see him back in 3 weeks prior to start of cycle 2.  2.  Myeloma bone disease: -He received Xgeva last year.  He tolerated it well. -We will plan to start him back on it.  3.  Anemia: -Etiology is CKD and myeloma.  He will continue Retacrit as needed.  4.  ID/thromboprophylaxis: -He will continue Valtrex twice daily and aspirin 81 mg daily.  5.  Mid to low back pain: -This has worsened in recent weeks.  We have increased Dilaudid dosage.  He is taking Dilaudid 4 mg 2 tablets every 8 hours. -Have told him to take 1 tablet every 4 hours as needed. -I went over the results of the MRI of the lumbar and thoracic spine from 04/28/2019.  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  Mild endplate fracture of L2 suspicious for pathological fracture not seen  previously. -I have recommended consultation with radiation therapy.  6.  Leg swelling: -We will continue torsemide 20 mg as needed.

## 2019-04-28 NOTE — Progress Notes (Signed)
Treatment given per orders. Patient tolerated it well without problems. Vitals stable and discharged home from clinic via wheelchair Follow up as scheduled.  

## 2019-04-28 NOTE — Progress Notes (Signed)
[address], [place] [address]   CLINIC:  Medical Oncology/Hematology  PCP:  Rosita Fire, MD [place] [address] [phone]   REASON FOR VISIT: Follow-up for Multiple myeloma    BRIEF ONCOLOGIC HISTORY:  Oncology History  Kappa light chain myeloma ([place])  06/16/2018 Initial Diagnosis   Kappa light chain myeloma ([place])   06/17/2018 - 04/10/2019 Chemotherapy   The patient had palonosetron (ALOXI) injection 0.25 mg, 0.25 mg, Intravenous,  Once, 27 of 27 cycles Administration: 0.25 mg (06/17/2018), 0.25 mg (06/23/2018), 0.25 mg (06/30/2018), 0.25 mg (07/07/2018), 0.25 mg (07/14/2018), 0.25 mg (07/22/2018), 0.25 mg (07/29/2018), 0.25 mg (08/06/2018), 0.25 mg (08/13/2018), 0.25 mg (08/20/2018), 0.25 mg (08/27/2018), 0.25 mg (09/06/2018), 0.25 mg (09/13/2018), 0.25 mg (09/20/2018), 0.25 mg (09/27/2018), 0.25 mg (10/04/2018), 0.25 mg (10/11/2018), 0.25 mg (10/18/2018), 0.25 mg (10/25/2018), 0.25 mg (11/01/2018), 0.25 mg (11/08/2018), 0.25 mg (11/15/2018), 0.25 mg (11/22/2018), 0.25 mg (11/29/2018), 0.25 mg (12/08/2018), 0.25 mg (12/16/2018) bortezomib SQ (VELCADE) chemo injection 2.5 mg, 1.3 mg/m2 = 2.5 mg, Subcutaneous,  Once, 41 of 42 cycles Administration: 2.5 mg (06/17/2018), 2.5 mg (06/23/2018), 2.5 mg (06/30/2018), 2.5 mg (07/07/2018), 2.5 mg (07/14/2018), 2.5 mg (07/22/2018), 2.5 mg (07/29/2018), 2.5 mg (08/06/2018), 2.5 mg (08/13/2018), 2.5 mg (08/20/2018), 2.5 mg (08/27/2018), 2.5 mg (09/06/2018), 2.5 mg (09/13/2018), 2.5 mg (09/20/2018), 2.5 mg (09/27/2018), 2.5 mg (10/04/2018), 2.5 mg (10/11/2018), 2.5 mg (10/18/2018), 2.5 mg (10/25/2018), 2.5 mg (11/01/2018), 2.5 mg (11/08/2018), 2.5 mg (11/15/2018), 2.5 mg (11/22/2018), 2.5 mg (11/29/2018), 2.5 mg (12/08/2018), 2.5 mg (12/16/2018), 2.5 mg (12/27/2018), 2.5 mg (01/03/2019), 2.5 mg (01/10/2019), 2.5 mg (01/17/2019), 2.5 mg (01/24/2019), 2.5 mg (01/31/2019), 2.5 mg (02/07/2019), 2.5 mg (02/14/2019), 2.5 mg (02/21/2019), 2.5 mg (02/28/2019), 2.5  mg (03/07/2019), 2.5 mg (03/14/2019), 2.5 mg (03/21/2019), 2.5 mg (03/29/2019), 2.5 mg (04/04/2019) cyclophosphamide (CYTOXAN) 300 mg in sodium chloride 0.9 % 250 mL chemo infusion, 300 mg (100 % of original dose 300 mg), Intravenous,  Once, 26 of 26 cycles Dose modification: 300 mg (original dose 300 mg, Cycle 1, Reason: Change in SCr/CrCl), 500 mg (original dose 500 mg, Cycle 2, Reason: Provider Judgment), 500 mg (original dose 500 mg, Cycle 3, Reason: Change in SCr/CrCl) Administration: 300 mg (06/17/2018), 500 mg (06/23/2018), 500 mg (06/30/2018), 600 mg (07/07/2018), 600 mg (07/14/2018), 600 mg (07/22/2018), 600 mg (07/29/2018), 600 mg (08/06/2018), 600 mg (08/13/2018), 600 mg (08/20/2018), 600 mg (08/27/2018), 600 mg (09/06/2018), 600 mg (09/13/2018), 600 mg (09/20/2018), 600 mg (09/27/2018), 600 mg (10/04/2018), 600 mg (10/11/2018), 600 mg (10/18/2018), 600 mg (10/25/2018), 600 mg (11/01/2018), 600 mg (11/08/2018), 600 mg (11/15/2018), 600 mg (11/22/2018), 600 mg (11/29/2018), 600 mg (12/08/2018), 600 mg (12/16/2018)  for chemotherapy treatment.    04/21/2019 -  Chemotherapy   The patient had carfilzomib (KYPROLIS) 40 mg in dextrose 5 % 50 mL chemo infusion, 20 mg/m2 = 40 mg, Intravenous,  Once, 1 of 6 cycles Dose modification: 27 mg/m2 (original dose 27 mg/m2, Cycle 1, Reason: Provider Judgment) Administration: 40 mg (04/21/2019), 40 mg (04/22/2019)  for chemotherapy treatment.       INTERVAL HISTORY:  [name] 59 y.o. male seen for follow-up of multiple myeloma.  He started carfilzomib last week.  He experienced some nausea but denied any vomiting.  Reports pain in the low back and he is taking Dilaudid 2 tablets every 8 hours.  Denies any falls.  No loss of strength in the lower extremities.  He had done MRI of his back today.  He  does report some constipation when we increased the pain medication.  Appetite is 25%.  Energy levels are 50%.  Back pain is rated as 8 out of 10.  Numbness in the bottom of the feet has been  stable.  REVIEW OF SYSTEMS:  Review of Systems  Gastrointestinal: Positive for constipation.  Musculoskeletal: Positive for back pain.  Neurological: Positive for numbness.  All other systems reviewed and are negative.    PAST MEDICAL/SURGICAL HISTORY:  Past Medical History:  Diagnosis Date  . Allergy   . Arthritis    neck and back  . Blood transfusion without reported diagnosis   . BPH (benign prostatic hyperplasia)   . Cancer [facility]) 2004   testicle  . Chronic kidney disease    kidney stones  . Left lumbar radiculopathy 06/12/2016  . 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 Medical history non-contributory    Pt has scattered thoughts and uncertain of past medical history  . 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 Stones, urinary tract   . Substance abuse ([place])    prescribed oxydcodone- 10 years   Past Surgical History:  Procedure Laterality Date  . BACK SURGERY     x5  . CERVICAL DISC SURGERY     x2  . COLONOSCOPY WITH PROPOFOL N/A 08/11/2016   Dr. Gala Romney: non-bleeding internal hemorrhoids, one 4 mm hyperplastic rectal polyp, diverticulosis in entire examined colon  . POLYPECTOMY  08/11/2016   Procedure: POLYPECTOMY;  Surgeon: [name], MD;  Location: [facility] ENDO SUITE;  Service: Endoscopy;;  colon  . PORTACATH PLACEMENT Left 09/20/2018   Procedure: INSERTION PORT-A-CATH (catheter attached left subclavian);  Surgeon: Virl Cagey, MD;  Location: [facility] ORS;  Service: General;  Laterality: Left;  . testicular cancer  2004  . TONSILLECTOMY       SOCIAL HISTORY:  Social History   Socioeconomic History  . Marital status: Single    Spouse name: Not on file  . Number of children: 1  . Years of education: 42  . Highest education level: Not on file  Occupational History  . Occupation: retired    Comment: Psychologist, prison and probation services  . Occupation: disabled  Tobacco Use  . Smoking status: Current Every Day Smoker    Packs/day: 1.00    Years: 40.00    Pack years: 40.00    Types:  Cigarettes    Start date: 03/31/1974  . Smokeless tobacco: Never Used  Substance and Sexual Activity  . Alcohol use: Yes    Comment: Occasional  . Drug use: No    Types: Cocaine    Comment: Remote hx of cocaine, quit 1989  . Sexual activity: Yes  Other Topics Concern  . Not on file  Social History Narrative   [employer] for 12 years   [employer] for 34 years      Never married   One daughter      Lives alone   Retail banker   Right-handed   Occasional caffeine use         Social Determinants of Health   Financial Resource Strain:   . Difficulty of Paying Living Expenses: Not on file  Food Insecurity:   . Worried About Charity fundraiser in the Last Year: Not on file  . Ran Out of Food in the Last Year: Not on file  Transportation Needs:   . Lack of Transportation (Medical): Not on file  . Lack of Transportation (Non-Medical): Not on file  Physical Activity:   .  Days of Exercise per Week: Not on file  . Minutes of Exercise per Session: Not on file  Stress:   . Feeling of Stress : Not on file  Social Connections:   . Frequency of Communication with Friends and Family: Not on file  . Frequency of Social Gatherings with Friends and Family: Not on file  . Attends Religious Services: Not on file  . Active Member of Clubs or Organizations: Not on file  . Attends Archivist Meetings: Not on file  . Marital Status: Not on file  Intimate Partner Violence: Not At Risk  . Fear of Current or Ex-Partner: No  . Emotionally Abused: No  . Physically Abused: No  . Sexually Abused: No    FAMILY HISTORY:  Family History  Problem Relation Age of Onset  . COPD Mother   . Heart disease Mother   . Other Father        Never knew his father.  . Thyroid disease Sister   . Arthritis Sister   . Heart disease Sister        bypass  . Colon cancer Neg Hx     CURRENT MEDICATIONS:  Outpatient Encounter Medications as of 04/28/2019  Medication Sig  .  aspirin EC 81 MG tablet Take 81 mg by mouth daily.  . bortezomib IV (VELCADE) 3.5 MG injection Inject 1.3 mg/m2 into the vein once a week.   . calcitRIOL (ROCALTROL) 0.5 MCG capsule Take 0.5 mcg by mouth daily.   Marland Kitchen CARFILZOMIB IV Inject into the vein.  . CYCLOPHOSPHAMIDE IV Inject into the vein once a week.  Marland Kitchen Epoetin Alfa (PROCRIT IJ) Inject as directed. Unsure of dosage- gets this once weekly  . HYDROmorphone (DILAUDID) 4 MG tablet Take 1-2 tablets (4-8 mg total) by mouth every 8 (eight) hours as needed for severe pain.  . Patiromer Sorbitex Calcium (VELTASSA PO) Take by mouth 2 (two) times a week.   . pomalidomide (POMALYST) 3 MG capsule Take 1 capsule (3 mg total) by mouth daily.  Marland Kitchen senna (SENOKOT) 8.6 MG tablet Take 1 tablet by mouth 2 (two) times daily.   . sodium bicarbonate 650 MG tablet Take 1,300 mg by mouth 2 (two) times daily.  Marland Kitchen torsemide (DEMADEX) 20 MG tablet TAKING 1 TABLET DAILY, AND 2 TABLETS ONLY IF HIS ANKLES ARE REALLY SWOLLEN  . valACYclovir (VALTREX) 500 MG tablet TAKE 1 TABLET BY MOUTH TWICE A DAY  . Misc. Devices MISC Please provide patient with rollaider.  Dx: multiple myeloma C90.0 (Patient not taking: Reported on 04/28/2019)  . prochlorperazine (COMPAZINE) 5 MG tablet Take 1 tablet (5 mg total) by mouth every 6 (six) hours as needed for nausea or vomiting.   No facility-administered encounter medications on file as of 04/28/2019.    ALLERGIES:  Allergies  Allergen Reactions  . Oxycodone-Acetaminophen   . Acetaminophen Nausea Only and Other (See Comments)    Elevated liver enzymes  . Cymbalta [Duloxetine Hcl] Swelling    Facial swelling  . Diclofenac Sodium Swelling       . Diclofenac Sodium Swelling  . Imodium [Loperamide] Swelling    facial  . Lyrica [Pregabalin] Swelling  . Morphine Other (See Comments)    Bradycardia   . Vioxx [Rofecoxib] Swelling  . Aleve [Naproxen] Swelling    eye     PHYSICAL EXAM:  ECOG Performance status: 1  Vitals:    04/28/19 1209  BP: (!) 157/76  Pulse: 92  Resp: 18  Temp: (!) 96.9 F (  36.1 C)  SpO2: 99%   Filed Weights   04/28/19 1209  Weight: 174 lb 6.4 oz (79.1 kg)    Physical Exam Vitals reviewed.  Constitutional:      Appearance: Normal appearance. He is normal weight.  Cardiovascular:     Rate and Rhythm: Normal rate and regular rhythm.     Heart sounds: Normal heart sounds.  Pulmonary:     Effort: Pulmonary effort is normal.     Breath sounds: Normal breath sounds.  Abdominal:     General: Bowel sounds are normal.     Palpations: Abdomen is soft.  Musculoskeletal:        General: Normal range of motion.     Right lower leg: No edema.     Left lower leg: No edema.  Skin:    General: Skin is warm and dry.  Neurological:     Mental Status: He is alert and oriented to person, place, and time. Mental status is at baseline.  Psychiatric:        Mood and Affect: Mood normal.        Behavior: Behavior normal.      LABORATORY DATA:  I have reviewed the labs as listed.  CBC    Component Value Date/Time   WBC 6.9 04/28/2019 1228   RBC 2.85 (L) 04/28/2019 1228   HGB 10.3 (L) 04/28/2019 1228   HCT 30.7 (L) 04/28/2019 1228   PLT 203 04/28/2019 1228   MCV 107.7 (H) 04/28/2019 1228   MCH 36.1 (H) 04/28/2019 1228   MCHC 33.6 04/28/2019 1228   RDW 14.4 04/28/2019 1228   LYMPHSABS 1.0 04/28/2019 1228   MONOABS 1.0 04/28/2019 1228   EOSABS 0.3 04/28/2019 1228   BASOSABS 0.0 04/28/2019 1228   CMP Latest Ref Rng & Units 04/28/2019 04/21/2019 04/13/2019  Glucose 70 - 99 mg/dL 125(H) 101(H) 94  BUN 6 - 20 mg/dL 66(H) 50(H) 54(H)  Creatinine 0.61 - 1.24 mg/dL 4.98(H) 5.15(H) 5.31(H)  Sodium 135 - 145 mmol/L 134(L) 136 140  Potassium 3.5 - 5.1 mmol/L 3.9 4.5 4.6  Chloride 98 - 111 mmol/L 99 100 101  CO2 22 - 32 mmol/L _0 Calcium 8.9 - 10.3 mg/dL 9.5 10.0 10.0  Total Protein 6.5 - 8.1 g/dL 6.9 7.2 6.9  Total Bilirubin 0.3 - 1.2 mg/dL 0.5 0.7 0.5  Alkaline Phos 38 - 126 U/L  68 74 65  AST 15 - 41 U/L 13(L) 12(L) 12(L)  ALT 0 - 44 U/L _1 I have personally reviewed his scans and discussed with him.     ASSESSMENT & PLAN:   Kappa light chain myeloma ([place]) 1.  Kappa light chain myeloma, high risk, stage III: -Bone marrow biopsy on 06/16/2018 shows 87% plasma cells.  Cytogenetics shows complex chromosome abnormalities.  Gain of 1 q. present.  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 -Last myeloma panel on 03/29/2019 shows kappa light chains to 23 increased from 150.  Ratio increased to 7.57 from 4.57. -He was reluctant to consider transplant at this time. -We started him on carfilzomib day 1 and 2 on 04/21/2019.  He experienced some nausea but denied any vomiting. -He received Pomalyst 3 mg tablets today.  I have told him to start taking it tonight.  We talked about side effects in detail. -He will continue Pomalyst for 14  days until 05/10/2020 synchronized with this cycle 1.  After this cycle he will have 3 weeks on 1 week off. -His phosphate has gone up slightly to 5.1.  We will keep a close eye on it.  If it continues to be high, will consider PhosLo. -I will send a prescription for Compazine 5 mg every 6 hours as needed.  We will see him back in 3 weeks prior to start of cycle 2.  2.  Myeloma bone disease: -He received Xgeva last year.  He tolerated it well. -We will plan to start him back on it.  3.  Anemia: -Etiology is CKD and myeloma.  He will continue Retacrit as needed.  4.  ID/thromboprophylaxis: -He will continue Valtrex twice daily and aspirin 81 mg daily.  5.  Mid to low back pain: -This has worsened in recent weeks.  We have increased Dilaudid dosage.  He is taking Dilaudid 4 mg 2 tablets every 8 hours. -Have told him to take 1 tablet every 4 hours as needed. -I went over the results of the MRI of the lumbar and  thoracic spine from 04/28/2019.  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  Mild endplate fracture of L2 suspicious for pathological fracture not seen previously. -I have recommended consultation with radiation therapy.  6.  Leg swelling: -We will continue torsemide 20 mg as needed.  Total time spent is 30 minutes with more than 50% of the time spent face-to-face discussing scan results, counseling and coordination of care.  Orders placed this encounter:  No orders of the defined types were placed in this encounter.     Derek Jack, MD [place] [phone]

## 2019-04-28 NOTE — Progress Notes (Signed)
1320 Labs,including BUN and Creatinine results, reviewed with and pt seen by Dr. Delton Coombes and pt approved for Kyprolis infusion and Retacrit injection for Hgb 10.3 today per MD

## 2019-04-28 NOTE — Patient Instructions (Addendum)
[place] at [facility] Discharge Instructions  You were seen today by Dr. Delton Coombes. He went over your recent lab and scan results. You have a small fracture above where you have previously had surgery, Dr.K thinks you could benefit from some radiation therapy. He will refer you to [facility] for radiation therapy. He will see you back in 3 weeks for labs and follow up.  START taking Pomalyst today take until February 10th, take one week off.   Thank you for choosing [place] at [facility] to provide your oncology and hematology care.  To afford each patient quality time with our provider, please arrive at least 15 minutes before your scheduled appointment time.   If you have a lab appointment with the [facility] please come in thru the  Main Entrance and check in at the main information desk  You need to re-schedule your appointment should you arrive 10 or more minutes late.  We strive to give you quality time with our providers, and arriving late affects you and other patients whose appointments are after yours.  Also, if you no show three or more times for appointments you may be dismissed from the clinic at the providers discretion.     Again, thank you for choosing [facility].  Our hope is that these requests will decrease the amount of time that you wait before being seen by our physicians.       _____________________________________________________________  Should you have questions after your visit to [facility], please contact our office at [phone] between the hours of 8:00 a.m. and 4:30 p.m.  Voicemails left after 4:00 p.m. will not be returned until the following business day.  For prescription refill requests, have your pharmacy contact our office and allow 72 hours.    Cancer Center Support Programs:   > Cancer Support Group  2nd Tuesday of the month 1pm-2pm, Journey Room

## 2019-04-28 NOTE — Patient Instructions (Addendum)
[facility] Discharge Instructions for Patients Receiving Chemotherapy   Beginning January 23rd 2017 lab work for the [facility] will be done in the  Main lab at [facility] on 1st floor. If you have a lab appointment with the [facility] please come in thru the  Main Entrance and check in at the main information desk   Today you received the following chemotherapy agents Kyprolis as well as Retacrit injection. Follow-up as scheduled. Call clinic for any questions or concerns  To help prevent nausea and vomiting after your treatment, we encourage you to take your nausea medication   If you develop nausea and vomiting, or diarrhea that is not controlled by your medication, call the clinic.  The clinic phone number is [phone]. Office hours are Monday-Friday 8:30am-5:00pm.  BELOW ARE SYMPTOMS THAT SHOULD BE REPORTED IMMEDIATELY:  *FEVER GREATER THAN 101.0 F  *CHILLS WITH OR WITHOUT FEVER  NAUSEA AND VOMITING THAT IS NOT CONTROLLED WITH YOUR NAUSEA MEDICATION  *UNUSUAL SHORTNESS OF BREATH  *UNUSUAL BRUISING OR BLEEDING  TENDERNESS IN MOUTH AND THROAT WITH OR WITHOUT PRESENCE OF ULCERS  *URINARY PROBLEMS  *BOWEL PROBLEMS  UNUSUAL RASH Items with * indicate a potential emergency and should be followed up as soon as possible. If you have an emergency after office hours please contact your primary care physician or go to the nearest emergency department.  Please call the clinic during office hours if you have any questions or concerns.   You may also contact the Patient Navigator at [phone] should you have any questions or need assistance in obtaining follow up care.      Resources For Cancer Patients and their Caregivers ? American Cancer Society: Can assist with transportation, wigs, general needs, runs Look Good Feel Better.        (804)416-4531 ? Cancer Care: Provides financial assistance, online support groups, medication/co-pay  assistance.  1-800-813-HOPE (785) 761-5222) ? [place] Assists [place] cancer patients and their families through emotional , educational and financial support.  9840778586 ? [place] DSS Where to apply for food stamps, Medicaid and utility assistance. 214-474-2019 ? [facility]: Transportation to medical appointments. (336)199-8074 ? Social Security Administration: May apply for disability if have a Stage IV cancer. 520-702-1479 (902)644-6224 ? [employer], Disability and Transit Services: Assists with nutrition, care and transit needs. 303-282-1374

## 2019-04-29 ENCOUNTER — Inpatient Hospital Stay (HOSPITAL_COMMUNITY): Payer: Medicare Other

## 2019-04-29 VITALS — BP 135/55 | HR 80 | Temp 96.8°F | Resp 18

## 2019-04-29 DIAGNOSIS — Z5112 Encounter for antineoplastic immunotherapy: Secondary | ICD-10-CM | POA: Diagnosis not present

## 2019-04-29 DIAGNOSIS — C9000 Multiple myeloma not having achieved remission: Secondary | ICD-10-CM

## 2019-04-29 LAB — PROTEIN ELECTROPHORESIS, SERUM
A/G Ratio: 1.5 (ref 0.7–1.7)
Albumin ELP: 3.5 g/dL (ref 2.9–4.4)
Alpha-1-Globulin: 0.1 g/dL (ref 0.0–0.4)
Alpha-2-Globulin: 0.8 g/dL (ref 0.4–1.0)
Beta Globulin: 0.8 g/dL (ref 0.7–1.3)
Gamma Globulin: 0.7 g/dL (ref 0.4–1.8)
Globulin, Total: 2.4 g/dL (ref 2.2–3.9)
Total Protein ELP: 5.9 g/dL — ABNORMAL LOW (ref 6.0–8.5)

## 2019-04-29 LAB — KAPPA/LAMBDA LIGHT CHAINS
Kappa free light chain: 1117.8 mg/L — ABNORMAL HIGH (ref 3.3–19.4)
Kappa, lambda light chain ratio: 52.98 — ABNORMAL HIGH (ref 0.26–1.65)
Lambda free light chains: 21.1 mg/L (ref 5.7–26.3)

## 2019-04-29 MED ORDER — DEXTROSE 5 % IV SOLN
29.0000 mg/m2 | Freq: Once | INTRAVENOUS | Status: AC
  Administered 2019-04-29: 60 mg via INTRAVENOUS
  Filled 2019-04-29: qty 30

## 2019-04-29 MED ORDER — PROCHLORPERAZINE MALEATE 10 MG PO TABS: 10.0000 mg | ORAL_TABLET | Freq: Once | ORAL | Status: DC

## 2019-04-29 MED ORDER — SODIUM CHLORIDE 0.9 % IV SOLN: Freq: Once | INTRAVENOUS | Status: AC

## 2019-04-29 MED ORDER — SODIUM CHLORIDE 0.9% FLUSH
10.0000 mL | INTRAVENOUS | Status: DC | PRN
  Administered 2019-04-29 (×2): 10 mL

## 2019-04-29 MED ORDER — HEPARIN SOD (PORK) LOCK FLUSH 100 UNIT/ML IV SOLN
500.0000 [IU] | Freq: Once | INTRAVENOUS | Status: AC | PRN
  Administered 2019-04-29: 500 [IU]

## 2019-04-29 MED ORDER — PROCHLORPERAZINE MALEATE 10 MG PO TABS
10.0000 mg | ORAL_TABLET | Freq: Once | ORAL | Status: AC
  Administered 2019-04-29: 10 mg via ORAL
  Filled 2019-04-29: qty 1

## 2019-04-29 NOTE — Progress Notes (Signed)
Patient presents today for treatment Day 9 Kyprolis. Patient has no complaints of any changes since his last visit. MAR reviewed and updated. Patient states he is taking his Pomalyst 3mg  by mouth daily.   Treatment given today per MD orders. Tolerated infusion without adverse affects. Vital signs stable. No complaints at this time. Discharged from clinic ambulatory. F/U with [facility] as scheduled.

## 2019-05-03 ENCOUNTER — Other Ambulatory Visit (HOSPITAL_COMMUNITY): Payer: Self-pay | Admitting: Hematology

## 2019-05-05 ENCOUNTER — Other Ambulatory Visit: Payer: Self-pay

## 2019-05-05 ENCOUNTER — Ambulatory Visit (HOSPITAL_COMMUNITY): Payer: Medicare Other

## 2019-05-05 ENCOUNTER — Inpatient Hospital Stay (HOSPITAL_COMMUNITY): Payer: Medicare Other

## 2019-05-05 ENCOUNTER — Encounter (HOSPITAL_COMMUNITY): Payer: Self-pay

## 2019-05-05 ENCOUNTER — Inpatient Hospital Stay (HOSPITAL_COMMUNITY): Payer: Medicare Other | Attending: Hematology

## 2019-05-05 VITALS — BP 160/88 | HR 99 | Temp 97.7°F | Resp 18 | Wt 171.4 lb

## 2019-05-05 DIAGNOSIS — D631 Anemia in chronic kidney disease: Secondary | ICD-10-CM | POA: Diagnosis not present

## 2019-05-05 DIAGNOSIS — R112 Nausea with vomiting, unspecified: Secondary | ICD-10-CM | POA: Insufficient documentation

## 2019-05-05 DIAGNOSIS — C9000 Multiple myeloma not having achieved remission: Secondary | ICD-10-CM | POA: Diagnosis present

## 2019-05-05 DIAGNOSIS — M545 Low back pain: Secondary | ICD-10-CM | POA: Insufficient documentation

## 2019-05-05 DIAGNOSIS — N189 Chronic kidney disease, unspecified: Secondary | ICD-10-CM | POA: Diagnosis not present

## 2019-05-05 DIAGNOSIS — Z5112 Encounter for antineoplastic immunotherapy: Secondary | ICD-10-CM | POA: Diagnosis not present

## 2019-05-05 LAB — CBC WITH DIFFERENTIAL/PLATELET
Abs Immature Granulocytes: 0.07 10*3/uL (ref 0.00–0.07)
Basophils Absolute: 0 10*3/uL (ref 0.0–0.1)
Basophils Relative: 0 %
Eosinophils Absolute: 0.5 10*3/uL (ref 0.0–0.5)
Eosinophils Relative: 7 %
HCT: 33.6 % — ABNORMAL LOW (ref 39.0–52.0)
Hemoglobin: 11.2 g/dL — ABNORMAL LOW (ref 13.0–17.0)
Immature Granulocytes: 1 %
Lymphocytes Relative: 11 %
Lymphs Abs: 0.8 10*3/uL (ref 0.7–4.0)
MCH: 35.9 pg — ABNORMAL HIGH (ref 26.0–34.0)
MCHC: 33.3 g/dL (ref 30.0–36.0)
MCV: 107.7 fL — ABNORMAL HIGH (ref 80.0–100.0)
Monocytes Absolute: 0.9 10*3/uL (ref 0.1–1.0)
Monocytes Relative: 13 %
Neutro Abs: 4.4 10*3/uL (ref 1.7–7.7)
Neutrophils Relative %: 68 %
Platelets: 199 10*3/uL (ref 150–400)
RBC: 3.12 MIL/uL — ABNORMAL LOW (ref 4.22–5.81)
RDW: 14.9 % (ref 11.5–15.5)
WBC: 6.6 10*3/uL (ref 4.0–10.5)
nRBC: 0 % (ref 0.0–0.2)

## 2019-05-05 LAB — COMPREHENSIVE METABOLIC PANEL
ALT: 10 U/L (ref 0–44)
AST: 15 U/L (ref 15–41)
Albumin: 4.2 g/dL (ref 3.5–5.0)
Alkaline Phosphatase: 62 U/L (ref 38–126)
Anion gap: 14 (ref 5–15)
BUN: 38 mg/dL — ABNORMAL HIGH (ref 6–20)
CO2: 23 mmol/L (ref 22–32)
Calcium: 10 mg/dL (ref 8.9–10.3)
Chloride: 100 mmol/L (ref 98–111)
Creatinine, Ser: 4.41 mg/dL — ABNORMAL HIGH (ref 0.61–1.24)
GFR calc Af Amer: 16 mL/min — ABNORMAL LOW (ref >60)
GFR calc non Af Amer: 14 mL/min — ABNORMAL LOW (ref >60)
Glucose, Bld: 101 mg/dL — ABNORMAL HIGH (ref 70–99)
Potassium: 4.3 mmol/L (ref 3.5–5.1)
Sodium: 137 mmol/L (ref 135–145)
Total Bilirubin: 0.6 mg/dL (ref 0.3–1.2)
Total Protein: 6.6 g/dL (ref 6.5–8.1)

## 2019-05-05 LAB — MAGNESIUM: Magnesium: 2.2 mg/dL (ref 1.7–2.4)

## 2019-05-05 MED ORDER — DEXTROSE 5 % IV SOLN
60.0000 mg | Freq: Once | INTRAVENOUS | Status: AC
  Administered 2019-05-05: 60 mg via INTRAVENOUS
  Filled 2019-05-05: qty 30

## 2019-05-05 MED ORDER — SODIUM CHLORIDE 0.9 % IV SOLN: Freq: Once | INTRAVENOUS | Status: AC

## 2019-05-05 MED ORDER — CLONIDINE HCL 0.1 MG PO TABS
0.1000 mg | ORAL_TABLET | Freq: Once | ORAL | Status: AC
  Administered 2019-05-05: 0.1 mg via ORAL

## 2019-05-05 MED ORDER — CLONIDINE HCL 0.1 MG PO TABS
0.1000 mg | ORAL_TABLET | Freq: Once | ORAL | Status: AC
  Administered 2019-05-05: 0.1 mg via ORAL
  Filled 2019-05-05: qty 1

## 2019-05-05 MED ORDER — SODIUM CHLORIDE 0.9 % IV SOLN
40.0000 mg | Freq: Once | INTRAVENOUS | Status: AC
  Administered 2019-05-05: 40 mg via INTRAVENOUS
  Filled 2019-05-05: qty 4

## 2019-05-05 MED ORDER — PROCHLORPERAZINE MALEATE 10 MG PO TABS
10.0000 mg | ORAL_TABLET | Freq: Once | ORAL | Status: AC
  Administered 2019-05-05: 10 mg via ORAL
  Filled 2019-05-05: qty 1

## 2019-05-05 MED ORDER — SODIUM CHLORIDE 0.9% FLUSH
10.0000 mL | INTRAVENOUS | Status: DC | PRN
  Administered 2019-05-05: 09:00:00 10 mL

## 2019-05-05 MED ORDER — HEPARIN SOD (PORK) LOCK FLUSH 100 UNIT/ML IV SOLN
500.0000 [IU] | Freq: Once | INTRAVENOUS | Status: AC | PRN
  Administered 2019-05-05: 500 [IU]

## 2019-05-05 MED ORDER — CLONIDINE HCL 0.1 MG PO TABS
ORAL_TABLET | ORAL | Status: AC
  Filled 2019-05-05: qty 1

## 2019-05-05 NOTE — Progress Notes (Signed)
Labs reviewed by MD. Proceed as planned per MD. Creatinine noted by MD and ok to treat.   Blood pressure is elevated today. MD notified, will give 0.1 mg of clonidine per orders.   Blood pressure still elevated. MD aware. Will give another dose of clonidine per MD  Blood pressure rechecked. See flow sheet. Ok to discharge per MD. Treatment given per orders. Patient tolerated it well without problems. Vitals stable and discharged home from clinic ambulatory. Follow up as scheduled.

## 2019-05-05 NOTE — Patient Instructions (Signed)
[facility] at [facility]  Discharge Instructions:   _______________________________________________________________  Thank you for choosing [facility] at [facility] to provide your oncology and hematology care.  To afford each patient quality time with our providers, please arrive at least 15 minutes before your scheduled appointment.  You need to re-schedule your appointment if you arrive 10 or more minutes late.  We strive to give you quality time with our providers, and arriving late affects you and other patients whose appointments are after yours.  Also, if you no show three or more times for appointments you may be dismissed from the clinic.  Again, thank you for choosing [facility] at [facility]. Our hope is that these requests will allow you access to exceptional care and in a timely manner. _______________________________________________________________  If you have questions after your visit, please contact our office at [phone] between the hours of 8:30 a.m. and 5:00 p.m. Voicemails left after 4:30 p.m. will not be returned until the following business day. _______________________________________________________________  For prescription refill requests, have your pharmacy contact our office. _______________________________________________________________  Recommendations made by the consultant and any test results will be sent to your referring physician. _______________________________________________________________ 

## 2019-05-06 ENCOUNTER — Inpatient Hospital Stay (HOSPITAL_COMMUNITY): Payer: Medicare Other

## 2019-05-06 ENCOUNTER — Encounter (HOSPITAL_COMMUNITY): Payer: Self-pay

## 2019-05-06 VITALS — BP 148/92 | HR 84 | Temp 97.5°F | Resp 18

## 2019-05-06 DIAGNOSIS — C9000 Multiple myeloma not having achieved remission: Secondary | ICD-10-CM

## 2019-05-06 DIAGNOSIS — Z5112 Encounter for antineoplastic immunotherapy: Secondary | ICD-10-CM | POA: Diagnosis not present

## 2019-05-06 MED ORDER — PROCHLORPERAZINE MALEATE 10 MG PO TABS: 10.0000 mg | ORAL_TABLET | Freq: Once | ORAL | Status: DC

## 2019-05-06 MED ORDER — SODIUM CHLORIDE 0.9 % IV SOLN: Freq: Once | INTRAVENOUS | Status: AC

## 2019-05-06 MED ORDER — PROCHLORPERAZINE MALEATE 10 MG PO TABS
10.0000 mg | ORAL_TABLET | Freq: Once | ORAL | Status: AC
  Administered 2019-05-06: 10 mg via ORAL
  Filled 2019-05-06: qty 1

## 2019-05-06 MED ORDER — HEPARIN SOD (PORK) LOCK FLUSH 100 UNIT/ML IV SOLN
500.0000 [IU] | Freq: Once | INTRAVENOUS | Status: AC | PRN
  Administered 2019-05-06: 12:00:00 500 [IU]

## 2019-05-06 MED ORDER — SODIUM CHLORIDE 0.9% FLUSH
10.0000 mL | INTRAVENOUS | Status: DC | PRN
  Administered 2019-05-06: 10 mL

## 2019-05-06 MED ORDER — DEXTROSE 5 % IV SOLN
60.0000 mg | Freq: Once | INTRAVENOUS | Status: AC
  Administered 2019-05-06: 60 mg via INTRAVENOUS
  Filled 2019-05-06: qty 30

## 2019-05-06 NOTE — Patient Instructions (Signed)
[facility] Discharge Instructions for Patients Receiving Chemotherapy   Beginning January 23rd 2017 lab work for the Cancer Center will be done in the  Main lab at [place] on 1st floor. If you have a lab appointment with the Cancer Center please come in thru the  Main Entrance and check in at the main information desk   Today you received the following chemotherapy agents Kyprolis. Follow-up as scheduled. Call clinic for any questions or concerns  To help prevent nausea and vomiting after your treatment, we encourage you to take your nausea medication   If you develop nausea and vomiting, or diarrhea that is not controlled by your medication, call the clinic.  The clinic phone number is [phone]. Office hours are Monday-Friday 8:30am-5:00pm.  BELOW ARE SYMPTOMS THAT SHOULD BE REPORTED IMMEDIATELY:  *FEVER GREATER THAN 101.0 F  *CHILLS WITH OR WITHOUT FEVER  NAUSEA AND VOMITING THAT IS NOT CONTROLLED WITH YOUR NAUSEA MEDICATION  *UNUSUAL SHORTNESS OF BREATH  *UNUSUAL BRUISING OR BLEEDING  TENDERNESS IN MOUTH AND THROAT WITH OR WITHOUT PRESENCE OF ULCERS  *URINARY PROBLEMS  *BOWEL PROBLEMS  UNUSUAL RASH Items with * indicate a potential emergency and should be followed up as soon as possible. If you have an emergency after office hours please contact your primary care physician or go to the nearest emergency department.  Please call the clinic during office hours if you have any questions or concerns.   You may also contact the Patient Navigator at [phone] should you have any questions or need assistance in obtaining follow up care.      Resources For Cancer Patients and their Caregivers ? American Cancer Society: Can assist with transportation, wigs, general needs, runs Look Good Feel Better.        1-888-227-6333 ? Cancer Care: Provides financial assistance, online support groups, medication/co-pay assistance.  1-800-813-HOPE  (4673) ? [facility] Assists [place] cancer patients and their families through emotional , educational and financial support.  336-427-4357 ? [place] DSS Where to apply for food stamps, Medicaid and utility assistance. 336-342-1394 ? [facility]: Transportation to medical appointments. 336-347-2287 ? Social Security Administration: May apply for disability if have a Stage IV cancer. 336-342-7796 1-800-772-1213 ? [place] Aging, Disability and Transit Services: Assists with nutrition, care and transit needs. 336-349-2343         

## 2019-05-06 NOTE — Progress Notes (Signed)
[name] tolerated [name] infusion well without complaints or incident. VSS upon discharge. Pt discharged self ambulatory in satisfactory condition

## 2019-05-12 ENCOUNTER — Other Ambulatory Visit (HOSPITAL_COMMUNITY): Payer: Self-pay | Admitting: *Deleted

## 2019-05-12 ENCOUNTER — Ambulatory Visit (HOSPITAL_COMMUNITY): Payer: Medicare Other

## 2019-05-12 ENCOUNTER — Other Ambulatory Visit (HOSPITAL_COMMUNITY): Payer: Medicare Other

## 2019-05-12 DIAGNOSIS — C9000 Multiple myeloma not having achieved remission: Secondary | ICD-10-CM

## 2019-05-12 MED ORDER — POMALIDOMIDE 3 MG PO CAPS: 3.0000 mg | ORAL_CAPSULE | Freq: Every day | ORAL | 0 refills | Status: DC

## 2019-05-18 ENCOUNTER — Other Ambulatory Visit (HOSPITAL_COMMUNITY): Payer: Medicare Other

## 2019-05-19 ENCOUNTER — Other Ambulatory Visit (HOSPITAL_COMMUNITY): Payer: Medicare Other

## 2019-05-19 ENCOUNTER — Ambulatory Visit (HOSPITAL_COMMUNITY): Payer: Medicare Other

## 2019-05-19 ENCOUNTER — Ambulatory Visit (HOSPITAL_COMMUNITY): Payer: Medicare Other | Admitting: Hematology

## 2019-05-20 ENCOUNTER — Other Ambulatory Visit (HOSPITAL_COMMUNITY): Payer: Medicare Other

## 2019-05-20 ENCOUNTER — Ambulatory Visit (HOSPITAL_COMMUNITY): Payer: Medicare Other | Admitting: Hematology

## 2019-05-20 ENCOUNTER — Ambulatory Visit (HOSPITAL_COMMUNITY): Payer: Medicare Other

## 2019-05-20 ENCOUNTER — Other Ambulatory Visit (HOSPITAL_COMMUNITY): Payer: Self-pay | Admitting: *Deleted

## 2019-05-20 MED ORDER — HYDROMORPHONE HCL 4 MG PO TABS: 4.0000 mg | ORAL_TABLET | Freq: Three times a day (TID) | ORAL | 0 refills | Status: DC | PRN

## 2019-05-23 ENCOUNTER — Ambulatory Visit (HOSPITAL_COMMUNITY): Payer: Medicare Other

## 2019-05-24 ENCOUNTER — Encounter (HOSPITAL_COMMUNITY): Payer: Medicare Other

## 2019-05-24 ENCOUNTER — Encounter: Payer: Medicare Other | Admitting: Vascular Surgery

## 2019-05-24 ENCOUNTER — Other Ambulatory Visit (HOSPITAL_COMMUNITY): Payer: Medicare Other

## 2019-05-26 ENCOUNTER — Inpatient Hospital Stay (HOSPITAL_COMMUNITY): Payer: Medicare Other

## 2019-05-26 ENCOUNTER — Encounter (HOSPITAL_COMMUNITY): Payer: Self-pay | Admitting: Hematology

## 2019-05-26 ENCOUNTER — Inpatient Hospital Stay (HOSPITAL_BASED_OUTPATIENT_CLINIC_OR_DEPARTMENT_OTHER): Payer: Medicare Other | Admitting: Hematology

## 2019-05-26 ENCOUNTER — Other Ambulatory Visit: Payer: Self-pay

## 2019-05-26 VITALS — BP 130/52 | HR 50 | Temp 98.2°F | Resp 18

## 2019-05-26 VITALS — BP 140/71 | HR 52 | Temp 97.1°F | Resp 18 | Wt 168.2 lb

## 2019-05-26 DIAGNOSIS — N185 Chronic kidney disease, stage 5: Secondary | ICD-10-CM

## 2019-05-26 DIAGNOSIS — C9000 Multiple myeloma not having achieved remission: Secondary | ICD-10-CM

## 2019-05-26 DIAGNOSIS — Z5112 Encounter for antineoplastic immunotherapy: Secondary | ICD-10-CM | POA: Diagnosis not present

## 2019-05-26 LAB — CBC WITH DIFFERENTIAL/PLATELET
Abs Immature Granulocytes: 0.01 10*3/uL (ref 0.00–0.07)
Basophils Absolute: 0.1 10*3/uL (ref 0.0–0.1)
Basophils Relative: 2 %
Eosinophils Absolute: 0.3 10*3/uL (ref 0.0–0.5)
Eosinophils Relative: 6 %
HCT: 26.7 % — ABNORMAL LOW (ref 39.0–52.0)
Hemoglobin: 8.9 g/dL — ABNORMAL LOW (ref 13.0–17.0)
Immature Granulocytes: 0 %
Lymphocytes Relative: 11 %
Lymphs Abs: 0.4 10*3/uL — ABNORMAL LOW (ref 0.7–4.0)
MCH: 36.6 pg — ABNORMAL HIGH (ref 26.0–34.0)
MCHC: 33.3 g/dL (ref 30.0–36.0)
MCV: 109.9 fL — ABNORMAL HIGH (ref 80.0–100.0)
Monocytes Absolute: 0.7 10*3/uL (ref 0.1–1.0)
Monocytes Relative: 16 %
Neutro Abs: 2.7 10*3/uL (ref 1.7–7.7)
Neutrophils Relative %: 65 %
Platelets: 270 10*3/uL (ref 150–400)
RBC: 2.43 MIL/uL — ABNORMAL LOW (ref 4.22–5.81)
RDW: 15 % (ref 11.5–15.5)
WBC: 4.2 10*3/uL (ref 4.0–10.5)
nRBC: 0 % (ref 0.0–0.2)

## 2019-05-26 LAB — COMPREHENSIVE METABOLIC PANEL
ALT: 9 U/L (ref 0–44)
AST: 13 U/L — ABNORMAL LOW (ref 15–41)
Albumin: 3.8 g/dL (ref 3.5–5.0)
Alkaline Phosphatase: 59 U/L (ref 38–126)
Anion gap: 9 (ref 5–15)
BUN: 38 mg/dL — ABNORMAL HIGH (ref 6–20)
CO2: 24 mmol/L (ref 22–32)
Calcium: 9.4 mg/dL (ref 8.9–10.3)
Chloride: 104 mmol/L (ref 98–111)
Creatinine, Ser: 4.45 mg/dL — ABNORMAL HIGH (ref 0.61–1.24)
GFR calc Af Amer: 16 mL/min — ABNORMAL LOW (ref >60)
GFR calc non Af Amer: 13 mL/min — ABNORMAL LOW (ref >60)
Glucose, Bld: 92 mg/dL (ref 70–99)
Potassium: 4.2 mmol/L (ref 3.5–5.1)
Sodium: 137 mmol/L (ref 135–145)
Total Bilirubin: 0.8 mg/dL (ref 0.3–1.2)
Total Protein: 6.4 g/dL — ABNORMAL LOW (ref 6.5–8.1)

## 2019-05-26 LAB — LACTATE DEHYDROGENASE: LDH: 143 U/L (ref 98–192)

## 2019-05-26 MED ORDER — HEPARIN SOD (PORK) LOCK FLUSH 100 UNIT/ML IV SOLN
500.0000 [IU] | Freq: Once | INTRAVENOUS | Status: AC | PRN
  Administered 2019-05-26: 13:00:00 500 [IU]

## 2019-05-26 MED ORDER — PROCHLORPERAZINE MALEATE 10 MG PO TABS
10.0000 mg | ORAL_TABLET | Freq: Once | ORAL | Status: AC
  Administered 2019-05-26: 11:00:00 10 mg via ORAL
  Filled 2019-05-26: qty 1

## 2019-05-26 MED ORDER — SODIUM CHLORIDE 0.9 % IV SOLN: Freq: Once | INTRAVENOUS | Status: AC

## 2019-05-26 MED ORDER — SODIUM CHLORIDE 0.9 % IV SOLN
40.0000 mg | Freq: Once | INTRAVENOUS | Status: AC
  Administered 2019-05-26: 11:00:00 40 mg via INTRAVENOUS
  Filled 2019-05-26: qty 4

## 2019-05-26 MED ORDER — SODIUM CHLORIDE 0.9% FLUSH
10.0000 mL | INTRAVENOUS | Status: DC | PRN
  Administered 2019-05-26: 13:00:00 10 mL

## 2019-05-26 MED ORDER — DEXTROSE 5 % IV SOLN
27.0000 mg/m2 | Freq: Once | INTRAVENOUS | Status: AC
  Administered 2019-05-26: 13:00:00 56 mg via INTRAVENOUS
  Filled 2019-05-26: qty 28

## 2019-05-26 NOTE — Progress Notes (Signed)
05/26/19  Keep dose of Kyprolis at 56 mg per Dr Delton Coombes due to weight loss.  T.O. Dr Rhys Martini, PharmD

## 2019-05-26 NOTE — Patient Instructions (Signed)
[facility] at [facility] Discharge Instructions  You were seen today by Dr. Katragadda. He went over your recent lab results. He will see you back in 2 weeks for labs, treatment and follow up.   Thank you for choosing [facility] at [facility] to provide your oncology and hematology care.  To afford each patient quality time with our provider, please arrive at least 15 minutes before your scheduled appointment time.   If you have a lab appointment with the Cancer Center please come in thru the  Main Entrance and check in at the main information desk  You need to re-schedule your appointment should you arrive 10 or more minutes late.  We strive to give you quality time with our providers, and arriving late affects you and other patients whose appointments are after yours.  Also, if you no show three or more times for appointments you may be dismissed from the clinic at the providers discretion.     Again, thank you for choosing [facility].  Our hope is that these requests will decrease the amount of time that you wait before being seen by our physicians.       _____________________________________________________________  Should you have questions after your visit to [facility], please contact our office at [phone] between the hours of 8:00 a.m. and 4:30 p.m.  Voicemails left after 4:00 p.m. will not be returned until the following business day.  For prescription refill requests, have your pharmacy contact our office and allow 72 hours.    Cancer Center Support Programs:   > Cancer Support Group  2nd Tuesday of the month 1pm-2pm, Journey Room    

## 2019-05-26 NOTE — Progress Notes (Signed)
Patient presents today for treatment and follow up visit with Dr. Delton Coombes. Vital signs within parameters for treatment. Patient denies any significant changes since his last visit. Patient has complaints of pain rated a 7/10 in his abdomen. Pain is an ache and ongoing.   Treatment given today per MD orders. Tolerated infusion without adverse affects. Vital signs stable. No complaints at this time. Discharged from clinic ambulatory. F/U with [facility] as scheduled.

## 2019-05-26 NOTE — Progress Notes (Signed)
Patient has been assessed, vital signs and labs have been reviewed by Dr. Katragadda. ANC, Creatinine, LFTs, and Platelets are within treatment parameters per Dr. Katragadda. The patient is good to proceed with treatment at this time.  

## 2019-05-26 NOTE — Patient Instructions (Signed)
[facility] Discharge Instructions for Patients Receiving Chemotherapy  Today you received the following chemotherapy agents   To help prevent nausea and vomiting after your treatment, we encourage you to take your nausea medication   If you develop nausea and vomiting that is not controlled by your nausea medication, call the clinic.   BELOW ARE SYMPTOMS THAT SHOULD BE REPORTED IMMEDIATELY:  *FEVER GREATER THAN 100.5 F  *CHILLS WITH OR WITHOUT FEVER  NAUSEA AND VOMITING THAT IS NOT CONTROLLED WITH YOUR NAUSEA MEDICATION  *UNUSUAL SHORTNESS OF BREATH  *UNUSUAL BRUISING OR BLEEDING  TENDERNESS IN MOUTH AND THROAT WITH OR WITHOUT PRESENCE OF ULCERS  *URINARY PROBLEMS  *BOWEL PROBLEMS  UNUSUAL RASH Items with * indicate a potential emergency and should be followed up as soon as possible.  Feel free to call the clinic should you have any questions or concerns. The clinic phone number is [phone].  Please show the CHEMO ALERT CARD at check-in to the Emergency Department and triage nurse.   

## 2019-05-26 NOTE — Assessment & Plan Note (Signed)
1.  Kappa light chain myeloma, high risk, stage III: -Weekly CyBorD from 06/17/2018 through 04/04/2019. -Myeloma panel from 03/29/2019 showed kappa light chains increased consistent with progression. -He was reluctant to consider bone marrow transplant. -Carfilzomib pomalidomide and dexamethasone cycle 1 started on 04/21/2019.  He only took pomalidomide for 2 weeks with cycle 1. -Myeloma panel from 04/28/2019 shows kappa light chains 1117, lambda light chains 21.1, ratio 52.98. -We reviewed labs from today.  Hemoglobin is 8.9.  This is probably myelosuppression from radiation.  Will check ferritin and iron panel today. -His creatinine also improved to 4.45.  We will proceed with cycle 2 carfilzomib today. -He has missed shipment of Pomalyst for the last couple of days as he was not home.  He will likely receive the shipment this afternoon and will start taking Pomalyst later tonight. -I will reassess him in 2 weeks for follow-up.  2.  Mid to low back pain: -MRI of the lumbar and thoracic spine from 04/28/2019 showed ventral epidural tumor posterior to T12 body causing mild spinal stenosis.  Mild endplate fracture of L2 suspicious for pathological fracture not seen previously. -He is taking Dilaudid 4 mg 1 tablet every 4 hours.  Prior to going to radiation he takes 2 tablets. -XRT to the back from 05/23/2019 through 05/27/2019.  He reported some improvement in pain.  3.  Anemia: -Etiology is CKD and myeloma.  Slight worsening from XRT. -He will continue Retacrit as needed.  Will check ferritin and iron panel.  4.  Leg swelling: -He is taking torsemide 20 mg as needed.  5.  ID/thromboprophylaxis: -He will continue Valtrex twice daily and aspirin 81 mg daily.  6.  Nausea/vomiting: -He is experiencing some nausea and had vomited 4 times. -He was instructed to take Compazine 10 mg every 6 hours.  7.  Myeloma bone disease: -He received Xgeva last year.  We will plan to start him back on it.

## 2019-05-26 NOTE — Progress Notes (Signed)
[place] [address] [place], [place] [address]   CLINIC:  Medical Oncology/Hematology  PCP:  Rosita Fire, MD [place] [phone]   REASON FOR VISIT: Follow-up for Multiple myeloma    BRIEF ONCOLOGIC HISTORY:  Oncology History  Kappa light chain myeloma ([place])  06/16/2018 Initial Diagnosis   Kappa light chain myeloma ([place])   06/17/2018 - 04/10/2019 Chemotherapy   The patient had palonosetron (ALOXI) injection 0.25 mg, 0.25 mg, Intravenous,  Once, 27 of 27 cycles Administration: 0.25 mg (06/17/2018), 0.25 mg (06/23/2018), 0.25 mg (06/30/2018), 0.25 mg (07/07/2018), 0.25 mg (07/14/2018), 0.25 mg (07/22/2018), 0.25 mg (07/29/2018), 0.25 mg (08/06/2018), 0.25 mg (08/13/2018), 0.25 mg (08/20/2018), 0.25 mg (08/27/2018), 0.25 mg (09/06/2018), 0.25 mg (09/13/2018), 0.25 mg (09/20/2018), 0.25 mg (09/27/2018), 0.25 mg (10/04/2018), 0.25 mg (10/11/2018), 0.25 mg (10/18/2018), 0.25 mg (10/25/2018), 0.25 mg (11/01/2018), 0.25 mg (11/08/2018), 0.25 mg (11/15/2018), 0.25 mg (11/22/2018), 0.25 mg (11/29/2018), 0.25 mg (12/08/2018), 0.25 mg (12/16/2018) bortezomib SQ (VELCADE) chemo injection 2.5 mg, 1.3 mg/m2 = 2.5 mg, Subcutaneous,  Once, 41 of 42 cycles Administration: 2.5 mg (06/17/2018), 2.5 mg (06/23/2018), 2.5 mg (06/30/2018), 2.5 mg (07/07/2018), 2.5 mg (07/14/2018), 2.5 mg (07/22/2018), 2.5 mg (07/29/2018), 2.5 mg (08/06/2018), 2.5 mg (08/13/2018), 2.5 mg (08/20/2018), 2.5 mg (08/27/2018), 2.5 mg (09/06/2018), 2.5 mg (09/13/2018), 2.5 mg (09/20/2018), 2.5 mg (09/27/2018), 2.5 mg (10/04/2018), 2.5 mg (10/11/2018), 2.5 mg (10/18/2018), 2.5 mg (10/25/2018), 2.5 mg (11/01/2018), 2.5 mg (11/08/2018), 2.5 mg (11/15/2018), 2.5 mg (11/22/2018), 2.5 mg (11/29/2018), 2.5 mg (12/08/2018), 2.5 mg (12/16/2018), 2.5 mg (12/27/2018), 2.5 mg (01/03/2019), 2.5 mg (01/10/2019), 2.5 mg (01/17/2019), 2.5 mg (01/24/2019), 2.5 mg (01/31/2019), 2.5 mg (02/07/2019), 2.5 mg (02/14/2019), 2.5 mg (02/21/2019), 2.5 mg (02/28/2019), 2.5  mg (03/07/2019), 2.5 mg (03/14/2019), 2.5 mg (03/21/2019), 2.5 mg (03/29/2019), 2.5 mg (04/04/2019) cyclophosphamide (CYTOXAN) 300 mg in sodium chloride 0.9 % 250 mL chemo infusion, 300 mg (100 % of original dose 300 mg), Intravenous,  Once, 26 of 26 cycles Dose modification: 300 mg (original dose 300 mg, Cycle 1, Reason: Change in SCr/CrCl), 500 mg (original dose 500 mg, Cycle 2, Reason: Provider Judgment), 500 mg (original dose 500 mg, Cycle 3, Reason: Change in SCr/CrCl) Administration: 300 mg (06/17/2018), 500 mg (06/23/2018), 500 mg (06/30/2018), 600 mg (07/07/2018), 600 mg (07/14/2018), 600 mg (07/22/2018), 600 mg (07/29/2018), 600 mg (08/06/2018), 600 mg (08/13/2018), 600 mg (08/20/2018), 600 mg (08/27/2018), 600 mg (09/06/2018), 600 mg (09/13/2018), 600 mg (09/20/2018), 600 mg (09/27/2018), 600 mg (10/04/2018), 600 mg (10/11/2018), 600 mg (10/18/2018), 600 mg (10/25/2018), 600 mg (11/01/2018), 600 mg (11/08/2018), 600 mg (11/15/2018), 600 mg (11/22/2018), 600 mg (11/29/2018), 600 mg (12/08/2018), 600 mg (12/16/2018)  for chemotherapy treatment.    04/21/2019 -  Chemotherapy   The patient had carfilzomib (KYPROLIS) 40 mg in dextrose 5 % 50 mL chemo infusion, 20 mg/m2 = 40 mg, Intravenous,  Once, 2 of 6 cycles Dose modification: 27 mg/m2 (original dose 27 mg/m2, Cycle 1, Reason: Provider Judgment) Administration: 40 mg (04/21/2019), 40 mg (04/22/2019), 60 mg (04/28/2019), 60 mg (04/29/2019), 60 mg (05/05/2019), 60 mg (05/06/2019)  for chemotherapy treatment.       INTERVAL HISTORY:  [name] 59 y.o. male seen for follow-up of multiple myeloma.  He missed his treatment last week because of bad weather.  Appetite and energy levels are 25%.  He started radiation therapy this Monday.  He will finish it this Friday.  Pain in the back is reported as overall slightly improved.  He is taking Dilaudid 1 tablet every 4 hours.  He takes 2 tablets prior to going to radiation.  He reports that he vomited 4-5 times.  He also feels nauseous.  He did  not get the second shipment of pomalidomide yet.  REVIEW OF SYSTEMS:  Review of Systems  Gastrointestinal: Positive for constipation, nausea and vomiting.  Musculoskeletal: Positive for back pain.  Neurological: Positive for numbness.  All other systems reviewed and are negative.    PAST MEDICAL/SURGICAL HISTORY:  Past Medical History:  Diagnosis Date  . Allergy   . Arthritis    neck and back  . Blood transfusion without reported diagnosis   . BPH (benign prostatic hyperplasia)   . Cancer [facility]) 2004   testicle  . Chronic kidney disease    kidney stones  . Left lumbar radiculopathy 06/12/2016  . 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 Medical history non-contributory    Pt has scattered thoughts and uncertain of past medical history  . 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 Stones, urinary tract   . Substance abuse ([place])    prescribed oxydcodone- 10 years   Past Surgical History:  Procedure Laterality Date  . BACK SURGERY     x5  . CERVICAL DISC SURGERY     x2  . COLONOSCOPY WITH PROPOFOL N/A 08/11/2016   Dr. Gala Romney: non-bleeding internal hemorrhoids, one 4 mm hyperplastic rectal polyp, diverticulosis in entire examined colon  . POLYPECTOMY  08/11/2016   Procedure: POLYPECTOMY;  Surgeon: [name], MD;  Location: [facility] ENDO SUITE;  Service: Endoscopy;;  colon  . PORTACATH PLACEMENT Left 09/20/2018   Procedure: INSERTION PORT-A-CATH (catheter attached left subclavian);  Surgeon: Virl Cagey, MD;  Location: [facility] ORS;  Service: General;  Laterality: Left;  . testicular cancer  2004  . TONSILLECTOMY       SOCIAL HISTORY:  Social History   Socioeconomic History  . Marital status: Single    Spouse name: Not on file  . Number of children: 1  . Years of education: 76  . Highest education level: Not on file  Occupational History  . Occupation: retired    Comment: Psychologist, prison and probation services  . Occupation: disabled  Tobacco Use  . Smoking status: Current Every Day Smoker    Packs/day:  1.00    Years: 40.00    Pack years: 40.00    Types: Cigarettes    Start date: 03/31/1974  . Smokeless tobacco: Never Used  Substance and Sexual Activity  . Alcohol use: Yes    Comment: Occasional  . Drug use: No    Types: Cocaine    Comment: Remote hx of cocaine, quit 1989  . Sexual activity: Yes  Other Topics Concern  . Not on file  Social History Narrative   [employer] for 12 years   [employer] for 68 years      Never married   One daughter      Lives alone   Retail banker   Right-handed   Occasional caffeine use         Social Determinants of Health   Financial Resource Strain:   . Difficulty of Paying Living Expenses: Not on file  Food Insecurity:   . Worried About Charity fundraiser in the Last Year: Not on file  . Ran Out of Food in the Last Year: Not on file  Transportation Needs:   . Lack of Transportation (Medical): Not on file  . Lack of Transportation (Non-Medical): Not on  file  Physical Activity:   . Days of Exercise per Week: Not on file  . Minutes of Exercise per Session: Not on file  Stress:   . Feeling of Stress : Not on file  Social Connections:   . Frequency of Communication with Friends and Family: Not on file  . Frequency of Social Gatherings with Friends and Family: Not on file  . Attends Religious Services: Not on file  . Active Member of Clubs or Organizations: Not on file  . Attends Archivist Meetings: Not on file  . Marital Status: Not on file  Intimate Partner Violence: Not At Risk  . Fear of Current or Ex-Partner: No  . Emotionally Abused: No  . Physically Abused: No  . Sexually Abused: No    FAMILY HISTORY:  Family History  Problem Relation Age of Onset  . COPD Mother   . Heart disease Mother   . Other Father        Never knew his father.  . Thyroid disease Sister   . Arthritis Sister   . Heart disease Sister        bypass  . Colon cancer Neg Hx     CURRENT MEDICATIONS:  Outpatient  Encounter Medications as of 05/26/2019  Medication Sig  . aspirin EC 81 MG tablet Take 81 mg by mouth daily.  . bortezomib IV (VELCADE) 3.5 MG injection Inject 1.3 mg/m2 into the vein once a week.   . calcitRIOL (ROCALTROL) 0.5 MCG capsule Take 0.5 mcg by mouth daily.   Marland Kitchen CARFILZOMIB IV Inject into the vein.  . CYCLOPHOSPHAMIDE IV Inject into the vein once a week.  Marland Kitchen Epoetin Alfa (PROCRIT IJ) Inject as directed. Unsure of dosage- gets this once weekly  . HYDROmorphone (DILAUDID) 4 MG tablet Take 1-2 tablets (4-8 mg total) by mouth every 8 (eight) hours as needed for severe pain.  . Misc. Devices MISC Please provide patient with rollaider.  Dx: multiple myeloma C90.0  . Patiromer Sorbitex Calcium (VELTASSA PO) Take by mouth 2 (two) times a week.   . senna (SENOKOT) 8.6 MG tablet Take 1 tablet by mouth 2 (two) times daily.   . sodium bicarbonate 650 MG tablet Take 1,300 mg by mouth 2 (two) times daily.  . valACYclovir (VALTREX) 500 MG tablet TAKE 1 TABLET BY MOUTH TWICE A DAY  . pomalidomide (POMALYST) 3 MG capsule Take 1 capsule (3 mg total) by mouth daily. (Patient not taking: Reported on 05/26/2019)  . prochlorperazine (COMPAZINE) 5 MG tablet Take 1 tablet (5 mg total) by mouth every 6 (six) hours as needed for nausea or vomiting. (Patient not taking: Reported on 05/26/2019)  . torsemide (DEMADEX) 20 MG tablet TAKING 1 TABLET DAILY, AND 2 TABLETS ONLY IF HIS ANKLES ARE REALLY SWOLLEN (Patient not taking: Reported on 05/26/2019)   No facility-administered encounter medications on file as of 05/26/2019.    ALLERGIES:  Allergies  Allergen Reactions  . Oxycodone-Acetaminophen   . Acetaminophen Nausea Only and Other (See Comments)    Elevated liver enzymes  . Cymbalta [Duloxetine Hcl] Swelling    Facial swelling  . Diclofenac Sodium Swelling       . Diclofenac Sodium Swelling  . Imodium [Loperamide] Swelling    facial  . Lyrica [Pregabalin] Swelling  . Morphine Other (See Comments)     Bradycardia   . Vioxx [Rofecoxib] Swelling  . Aleve [Naproxen] Swelling    eye     PHYSICAL EXAM:  ECOG Performance status: 1  Vitals:  05/26/19 0933 05/26/19 0955  BP: 140/71 140/71  Pulse: (!) 52 (!) 52  Resp: 18 18  Temp: (!) 97.1 F (36.2 C) (!) 97.1 F (36.2 C)  SpO2: 100% 100%   Filed Weights   05/26/19 0933 05/26/19 0955  Weight: 168 lb 3.2 oz (76.3 kg) 168 lb 3.2 oz (76.3 kg)    Physical Exam Vitals reviewed.  Constitutional:      Appearance: Normal appearance. He is normal weight.  Cardiovascular:     Rate and Rhythm: Normal rate and regular rhythm.     Heart sounds: Normal heart sounds.  Pulmonary:     Effort: Pulmonary effort is normal.     Breath sounds: Normal breath sounds.  Abdominal:     General: Bowel sounds are normal.     Palpations: Abdomen is soft.  Musculoskeletal:        General: Normal range of motion.     Right lower leg: No edema.     Left lower leg: No edema.  Skin:    General: Skin is warm and dry.  Neurological:     Mental Status: He is alert and oriented to person, place, and time. Mental status is at baseline.  Psychiatric:        Mood and Affect: Mood normal.        Behavior: Behavior normal.      LABORATORY DATA:  I have reviewed the labs as listed.  CBC    Component Value Date/Time   WBC 4.2 05/26/2019 0953   RBC 2.43 (L) 05/26/2019 0953   HGB 8.9 (L) 05/26/2019 0953   HCT 26.7 (L) 05/26/2019 0953   PLT 270 05/26/2019 0953   MCV 109.9 (H) 05/26/2019 0953   MCH 36.6 (H) 05/26/2019 0953   MCHC 33.3 05/26/2019 0953   RDW 15.0 05/26/2019 0953   LYMPHSABS 0.4 (L) 05/26/2019 0953   MONOABS 0.7 05/26/2019 0953   EOSABS 0.3 05/26/2019 0953   BASOSABS 0.1 05/26/2019 0953   CMP Latest Ref Rng & Units 05/26/2019 05/05/2019 04/28/2019  Glucose 70 - 99 mg/dL 92 101(H) 125(H)  BUN 6 - 20 mg/dL 38(H) 38(H) 66(H)  Creatinine 0.61 - 1.24 mg/dL 4.45(H) 4.41(H) 4.98(H)  Sodium 135 - 145 mmol/L 137 137 134(L)  Potassium 3.5 -  5.1 mmol/L 4.2 4.3 3.9  Chloride 98 - 111 mmol/L 104 100 99  CO2 22 - 32 mmol/L 24 23 24  Calcium 8.9 - 10.3 mg/dL 9.4 10.0 9.5  Total Protein 6.5 - 8.1 g/dL 6.4(L) 6.6 6.9  Total Bilirubin 0.3 - 1.2 mg/dL 0.8 0.6 0.5  Alkaline Phos 38 - 126 U/L 59 62 68  AST 15 - 41 U/L 13(L) 15 13(L)  ALT 0 - 44 U/L 9 10 9      I have personally reviewed his scans and discussed with him.     ASSESSMENT & PLAN:   Kappa light chain myeloma ([facility]) 1.  Kappa light chain myeloma, high risk, stage III: -Weekly CyBorD from 06/17/2018 through 04/04/2019. -Myeloma panel from 03/29/2019 showed kappa light chains increased consistent with progression. -He was reluctant to consider bone marrow transplant. -Carfilzomib pomalidomide and dexamethasone cycle 1 started on 04/21/2019.  He only took pomalidomide for 2 weeks with cycle 1. -Myeloma panel from 04/28/2019 shows kappa light chains 1117, lambda light chains 21.1, ratio 52.98. -We reviewed labs from today.  Hemoglobin is 8.9.  This is probably myelosuppression from radiation.  Will check ferritin and iron panel today. -His creatinine also improved to 4.45.    We will proceed with cycle 2 carfilzomib today. -He has missed shipment of Pomalyst for the last couple of days as he was not home.  He will likely receive the shipment this afternoon and will start taking Pomalyst later tonight. -I will reassess him in 2 weeks for follow-up.  2.  Mid to low back pain: -MRI of the lumbar and thoracic spine from 04/28/2019 showed ventral epidural tumor posterior to T12 body causing mild spinal stenosis.  Mild endplate fracture of L2 suspicious for pathological fracture not seen previously. -He is taking Dilaudid 4 mg 1 tablet every 4 hours.  Prior to going to radiation he takes 2 tablets. -XRT to the back from 05/23/2019 through 05/27/2019.  He reported some improvement in pain.  3.  Anemia: -Etiology is CKD and myeloma.  Slight worsening from XRT. -He will continue Retacrit  as needed.  Will check ferritin and iron panel.  4.  Leg swelling: -He is taking torsemide 20 mg as needed.  5.  ID/thromboprophylaxis: -He will continue Valtrex twice daily and aspirin 81 mg daily.  6.  Nausea/vomiting: -He is experiencing some nausea and had vomited 4 times. -He was instructed to take Compazine 10 mg every 6 hours.  7.  Myeloma bone disease: -He received Xgeva last year.  We will plan to start him back on it.    Orders placed this encounter:  Orders Placed This Encounter  Procedures  . Iron and TIBC  . Ferritin  . CBC with Differential/Platelet  . Comprehensive metabolic panel  . Protein electrophoresis, serum  . Kappa/lambda light chains  . Lactate dehydrogenase       , MD [facility] [phone]   

## 2019-05-27 ENCOUNTER — Inpatient Hospital Stay (HOSPITAL_COMMUNITY): Payer: Medicare Other

## 2019-05-27 VITALS — BP 139/82 | HR 86 | Temp 98.3°F | Resp 18 | Wt 169.2 lb

## 2019-05-27 DIAGNOSIS — N185 Chronic kidney disease, stage 5: Secondary | ICD-10-CM

## 2019-05-27 DIAGNOSIS — C9000 Multiple myeloma not having achieved remission: Secondary | ICD-10-CM

## 2019-05-27 DIAGNOSIS — Z5112 Encounter for antineoplastic immunotherapy: Secondary | ICD-10-CM | POA: Diagnosis not present

## 2019-05-27 LAB — IRON AND TIBC
Iron: 45 ug/dL (ref 45–182)
Saturation Ratios: 15 % — ABNORMAL LOW (ref 17.9–39.5)
TIBC: 298 ug/dL (ref 250–450)
UIBC: 253 ug/dL

## 2019-05-27 LAB — FERRITIN: Ferritin: 416 ng/mL — ABNORMAL HIGH (ref 24–336)

## 2019-05-27 LAB — KAPPA/LAMBDA LIGHT CHAINS
Kappa free light chain: 1615.3 mg/L — ABNORMAL HIGH (ref 3.3–19.4)
Kappa, lambda light chain ratio: 85.92 — ABNORMAL HIGH (ref 0.26–1.65)
Lambda free light chains: 18.8 mg/L (ref 5.7–26.3)

## 2019-05-27 MED ORDER — HEPARIN SOD (PORK) LOCK FLUSH 100 UNIT/ML IV SOLN
500.0000 [IU] | Freq: Once | INTRAVENOUS | Status: AC | PRN
  Administered 2019-05-27: 500 [IU]

## 2019-05-27 MED ORDER — SODIUM CHLORIDE 0.9 % IV SOLN: Freq: Once | INTRAVENOUS | Status: AC

## 2019-05-27 MED ORDER — DEXTROSE 5 % IV SOLN
27.0000 mg/m2 | Freq: Once | INTRAVENOUS | Status: AC
  Administered 2019-05-27: 12:00:00 56 mg via INTRAVENOUS
  Filled 2019-05-27: qty 28

## 2019-05-27 MED ORDER — PROCHLORPERAZINE MALEATE 10 MG PO TABS
10.0000 mg | ORAL_TABLET | Freq: Once | ORAL | Status: AC
  Administered 2019-05-27: 11:00:00 10 mg via ORAL
  Filled 2019-05-27: qty 1

## 2019-05-27 NOTE — Patient Instructions (Signed)
[facility] Discharge Instructions for Patients Receiving Chemotherapy  Today you received the following chemotherapy agents   To help prevent nausea and vomiting after your treatment, we encourage you to take your nausea medication   If you develop nausea and vomiting that is not controlled by your nausea medication, call the clinic.   BELOW ARE SYMPTOMS THAT SHOULD BE REPORTED IMMEDIATELY:  *FEVER GREATER THAN 100.5 F  *CHILLS WITH OR WITHOUT FEVER  NAUSEA AND VOMITING THAT IS NOT CONTROLLED WITH YOUR NAUSEA MEDICATION  *UNUSUAL SHORTNESS OF BREATH  *UNUSUAL BRUISING OR BLEEDING  TENDERNESS IN MOUTH AND THROAT WITH OR WITHOUT PRESENCE OF ULCERS  *URINARY PROBLEMS  *BOWEL PROBLEMS  UNUSUAL RASH Items with * indicate a potential emergency and should be followed up as soon as possible.  Feel free to call the clinic should you have any questions or concerns. The clinic phone number is [phone].  Please show the CHEMO ALERT CARD at check-in to the Emergency Department and triage nurse.   

## 2019-05-27 NOTE — Progress Notes (Signed)
Treatment given today per MD orders. Tolerated infusion without adverse affects. Vital signs stable. No complaints at this time. Discharged from clinic ambulatory. F/U with [facility] as scheduled.   

## 2019-05-30 LAB — PROTEIN ELECTROPHORESIS, SERUM
A/G Ratio: 1.7 (ref 0.7–1.7)
Albumin ELP: 3.7 g/dL (ref 2.9–4.4)
Alpha-1-Globulin: 0.1 g/dL (ref 0.0–0.4)
Alpha-2-Globulin: 0.7 g/dL (ref 0.4–1.0)
Beta Globulin: 0.7 g/dL (ref 0.7–1.3)
Gamma Globulin: 0.6 g/dL (ref 0.4–1.8)
Globulin, Total: 2.2 g/dL (ref 2.2–3.9)
Total Protein ELP: 5.9 g/dL — ABNORMAL LOW (ref 6.0–8.5)

## 2019-06-02 ENCOUNTER — Inpatient Hospital Stay (HOSPITAL_COMMUNITY): Payer: Medicare Other

## 2019-06-02 ENCOUNTER — Inpatient Hospital Stay (HOSPITAL_COMMUNITY): Payer: Medicare Other | Attending: Hematology

## 2019-06-02 ENCOUNTER — Other Ambulatory Visit: Payer: Self-pay

## 2019-06-02 VITALS — BP 169/75 | HR 50 | Temp 96.5°F | Resp 16 | Wt 174.3 lb

## 2019-06-02 DIAGNOSIS — C9000 Multiple myeloma not having achieved remission: Secondary | ICD-10-CM

## 2019-06-02 DIAGNOSIS — Z5112 Encounter for antineoplastic immunotherapy: Secondary | ICD-10-CM | POA: Insufficient documentation

## 2019-06-02 DIAGNOSIS — D631 Anemia in chronic kidney disease: Secondary | ICD-10-CM | POA: Insufficient documentation

## 2019-06-02 DIAGNOSIS — N189 Chronic kidney disease, unspecified: Secondary | ICD-10-CM | POA: Insufficient documentation

## 2019-06-02 DIAGNOSIS — N185 Chronic kidney disease, stage 5: Secondary | ICD-10-CM

## 2019-06-02 DIAGNOSIS — R112 Nausea with vomiting, unspecified: Secondary | ICD-10-CM | POA: Diagnosis not present

## 2019-06-02 DIAGNOSIS — M549 Dorsalgia, unspecified: Secondary | ICD-10-CM | POA: Insufficient documentation

## 2019-06-02 LAB — COMPREHENSIVE METABOLIC PANEL
ALT: 9 U/L (ref 0–44)
AST: 12 U/L — ABNORMAL LOW (ref 15–41)
Albumin: 3.4 g/dL — ABNORMAL LOW (ref 3.5–5.0)
Alkaline Phosphatase: 49 U/L (ref 38–126)
Anion gap: 9 (ref 5–15)
BUN: 51 mg/dL — ABNORMAL HIGH (ref 6–20)
CO2: 22 mmol/L (ref 22–32)
Calcium: 9.3 mg/dL (ref 8.9–10.3)
Chloride: 110 mmol/L (ref 98–111)
Creatinine, Ser: 5.14 mg/dL — ABNORMAL HIGH (ref 0.61–1.24)
GFR calc Af Amer: 13 mL/min — ABNORMAL LOW (ref >60)
GFR calc non Af Amer: 11 mL/min — ABNORMAL LOW (ref >60)
Glucose, Bld: 119 mg/dL — ABNORMAL HIGH (ref 70–99)
Potassium: 4.3 mmol/L (ref 3.5–5.1)
Sodium: 141 mmol/L (ref 135–145)
Total Bilirubin: 0.7 mg/dL (ref 0.3–1.2)
Total Protein: 5.7 g/dL — ABNORMAL LOW (ref 6.5–8.1)

## 2019-06-02 LAB — LACTATE DEHYDROGENASE: LDH: 133 U/L (ref 98–192)

## 2019-06-02 LAB — CBC WITH DIFFERENTIAL/PLATELET
Abs Immature Granulocytes: 0.05 10*3/uL (ref 0.00–0.07)
Basophils Absolute: 0 10*3/uL (ref 0.0–0.1)
Basophils Relative: 0 %
Eosinophils Absolute: 0.7 10*3/uL — ABNORMAL HIGH (ref 0.0–0.5)
Eosinophils Relative: 13 %
HCT: 26.2 % — ABNORMAL LOW (ref 39.0–52.0)
Hemoglobin: 8.7 g/dL — ABNORMAL LOW (ref 13.0–17.0)
Immature Granulocytes: 1 %
Lymphocytes Relative: 6 %
Lymphs Abs: 0.3 10*3/uL — ABNORMAL LOW (ref 0.7–4.0)
MCH: 36.6 pg — ABNORMAL HIGH (ref 26.0–34.0)
MCHC: 33.2 g/dL (ref 30.0–36.0)
MCV: 110.1 fL — ABNORMAL HIGH (ref 80.0–100.0)
Monocytes Absolute: 0.6 10*3/uL (ref 0.1–1.0)
Monocytes Relative: 11 %
Neutro Abs: 3.5 10*3/uL (ref 1.7–7.7)
Neutrophils Relative %: 69 %
Platelets: 145 10*3/uL — ABNORMAL LOW (ref 150–400)
RBC: 2.38 MIL/uL — ABNORMAL LOW (ref 4.22–5.81)
RDW: 15.7 % — ABNORMAL HIGH (ref 11.5–15.5)
WBC: 5.1 10*3/uL (ref 4.0–10.5)
nRBC: 0 % (ref 0.0–0.2)

## 2019-06-02 MED ORDER — SODIUM CHLORIDE 0.9 % IV SOLN
40.0000 mg | Freq: Once | INTRAVENOUS | Status: AC
  Administered 2019-06-02: 40 mg via INTRAVENOUS
  Filled 2019-06-02: qty 4

## 2019-06-02 MED ORDER — HEPARIN SOD (PORK) LOCK FLUSH 100 UNIT/ML IV SOLN
500.0000 [IU] | Freq: Once | INTRAVENOUS | Status: AC
  Administered 2019-06-02: 500 [IU] via INTRAVENOUS

## 2019-06-02 MED ORDER — PROCHLORPERAZINE MALEATE 10 MG PO TABS
10.0000 mg | ORAL_TABLET | Freq: Once | ORAL | Status: AC
  Administered 2019-06-02: 10 mg via ORAL
  Filled 2019-06-02: qty 1

## 2019-06-02 MED ORDER — SODIUM CHLORIDE 0.9 % IV SOLN: Freq: Once | INTRAVENOUS | Status: AC

## 2019-06-02 MED ORDER — SODIUM CHLORIDE 0.9% FLUSH
10.0000 mL | INTRAVENOUS | Status: DC | PRN
  Administered 2019-06-02: 10 mL

## 2019-06-02 MED ORDER — HEPARIN SOD (PORK) LOCK FLUSH 100 UNIT/ML IV SOLN: 500.0000 [IU] | Freq: Once | INTRAVENOUS | Status: DC | PRN

## 2019-06-02 MED ORDER — SODIUM CHLORIDE 0.9% FLUSH: 10.0000 mL | INTRAVENOUS | Status: DC | PRN

## 2019-06-02 MED ORDER — DEXTROSE 5 % IV SOLN
27.0000 mg/m2 | Freq: Once | INTRAVENOUS | Status: AC
  Administered 2019-06-02: 56 mg via INTRAVENOUS
  Filled 2019-06-02: qty 28

## 2019-06-02 MED ORDER — EPOETIN ALFA-EPBX 10000 UNIT/ML IJ SOLN
20000.0000 [IU] | Freq: Once | INTRAMUSCULAR | Status: AC
  Administered 2019-06-02: 20000 [IU] via SUBCUTANEOUS
  Filled 2019-06-02: qty 2

## 2019-06-02 NOTE — Progress Notes (Signed)
Labs reviewed with MD today. BUN/Creatinine noted 51/5.14  Proceed with treatment per MD.  Retacrit given per orders today. See MAR.    Treatment given per orders. Patient tolerated it well without problems. Vitals stable and discharged home from clinic ambulatory. Follow up as scheduled.

## 2019-06-02 NOTE — Patient Instructions (Signed)
[facility] Discharge Instructions for Patients Receiving Chemotherapy  Today you received the following chemotherapy agents   To help prevent nausea and vomiting after your treatment, we encourage you to take your nausea medication   If you develop nausea and vomiting that is not controlled by your nausea medication, call the clinic.   BELOW ARE SYMPTOMS THAT SHOULD BE REPORTED IMMEDIATELY:  *FEVER GREATER THAN 100.5 F  *CHILLS WITH OR WITHOUT FEVER  NAUSEA AND VOMITING THAT IS NOT CONTROLLED WITH YOUR NAUSEA MEDICATION  *UNUSUAL SHORTNESS OF BREATH  *UNUSUAL BRUISING OR BLEEDING  TENDERNESS IN MOUTH AND THROAT WITH OR WITHOUT PRESENCE OF ULCERS  *URINARY PROBLEMS  *BOWEL PROBLEMS  UNUSUAL RASH Items with * indicate a potential emergency and should be followed up as soon as possible.  Feel free to call the clinic should you have any questions or concerns. The clinic phone number is [phone].  Please show the CHEMO ALERT CARD at check-in to the Emergency Department and triage nurse.   

## 2019-06-03 ENCOUNTER — Inpatient Hospital Stay (HOSPITAL_COMMUNITY): Payer: Medicare Other

## 2019-06-03 VITALS — BP 131/79 | HR 77 | Temp 98.5°F | Resp 18

## 2019-06-03 DIAGNOSIS — C9000 Multiple myeloma not having achieved remission: Secondary | ICD-10-CM

## 2019-06-03 DIAGNOSIS — Z5112 Encounter for antineoplastic immunotherapy: Secondary | ICD-10-CM | POA: Diagnosis not present

## 2019-06-03 LAB — KAPPA/LAMBDA LIGHT CHAINS
Kappa free light chain: 587.5 mg/L — ABNORMAL HIGH (ref 3.3–19.4)
Kappa, lambda light chain ratio: 31.42 — ABNORMAL HIGH (ref 0.26–1.65)
Lambda free light chains: 18.7 mg/L (ref 5.7–26.3)

## 2019-06-03 MED ORDER — SODIUM CHLORIDE 0.9 % IV SOLN: Freq: Once | INTRAVENOUS | Status: AC

## 2019-06-03 MED ORDER — HYDROMORPHONE HCL 4 MG PO TABS
4.0000 mg | ORAL_TABLET | Freq: Once | ORAL | Status: AC
  Administered 2019-06-03: 4 mg via ORAL
  Filled 2019-06-03: qty 1

## 2019-06-03 MED ORDER — DEXTROSE 5 % IV SOLN
27.0000 mg/m2 | Freq: Once | INTRAVENOUS | Status: AC
  Administered 2019-06-03: 56 mg via INTRAVENOUS
  Filled 2019-06-03: qty 28

## 2019-06-03 MED ORDER — HYDROMORPHONE HCL 2 MG PO TABS
2.0000 mg | ORAL_TABLET | Freq: Once | ORAL | Status: DC
  Filled 2019-06-03: qty 1

## 2019-06-03 MED ORDER — PROCHLORPERAZINE MALEATE 10 MG PO TABS
ORAL_TABLET | ORAL | Status: AC
  Filled 2019-06-03: qty 1

## 2019-06-03 MED ORDER — PROCHLORPERAZINE MALEATE 10 MG PO TABS
10.0000 mg | ORAL_TABLET | Freq: Once | ORAL | Status: AC
  Administered 2019-06-03: 10 mg via ORAL

## 2019-06-03 MED ORDER — SODIUM CHLORIDE 0.9% FLUSH
10.0000 mL | INTRAVENOUS | Status: DC | PRN
  Administered 2019-06-03: 10 mL

## 2019-06-03 MED ORDER — HEPARIN SOD (PORK) LOCK FLUSH 100 UNIT/ML IV SOLN
500.0000 [IU] | Freq: Once | INTRAVENOUS | Status: AC | PRN
  Administered 2019-06-03: 500 [IU]

## 2019-06-03 NOTE — Patient Instructions (Signed)
[facility] Discharge Instructions for Patients Receiving Chemotherapy  Today you received the following chemotherapy agents   To help prevent nausea and vomiting after your treatment, we encourage you to take your nausea medication   If you develop nausea and vomiting that is not controlled by your nausea medication, call the clinic.   BELOW ARE SYMPTOMS THAT SHOULD BE REPORTED IMMEDIATELY:  *FEVER GREATER THAN 100.5 F  *CHILLS WITH OR WITHOUT FEVER  NAUSEA AND VOMITING THAT IS NOT CONTROLLED WITH YOUR NAUSEA MEDICATION  *UNUSUAL SHORTNESS OF BREATH  *UNUSUAL BRUISING OR BLEEDING  TENDERNESS IN MOUTH AND THROAT WITH OR WITHOUT PRESENCE OF ULCERS  *URINARY PROBLEMS  *BOWEL PROBLEMS  UNUSUAL RASH Items with * indicate a potential emergency and should be followed up as soon as possible.  Feel free to call the clinic should you have any questions or concerns. The clinic phone number is [phone].  Please show the CHEMO ALERT CARD at check-in to the Emergency Department and triage nurse.   

## 2019-06-03 NOTE — Progress Notes (Signed)
Patient presents today for Kyprolis. Vital signs are stable. Patient has no complaints of any changes since his last visit. MAR reviewed.    Patient complains of chronic pain in his back and states he was unable to take his pain medication due to he was side tracked and left the medication on the table per patient's words. BP elevated on arrival. Message sent to [facility] NP to give 4mg  of Dilaudid PO x 1 dose today.   Treatment given today per MD orders. Tolerated infusion without adverse affects. Vital signs stable. No complaints at this time. Discharged from clinic ambulatory. F/U with [facility] as scheduled.

## 2019-06-03 NOTE — Progress Notes (Signed)
06/03/19  Order received to give dilaudid 4 mg po x 1  T.O. Francene Finders, NP/[facility] Ronnald Ramp, PharmD

## 2019-06-06 ENCOUNTER — Encounter (HOSPITAL_COMMUNITY): Payer: Self-pay | Admitting: Emergency Medicine

## 2019-06-06 ENCOUNTER — Other Ambulatory Visit: Payer: Self-pay

## 2019-06-06 ENCOUNTER — Emergency Department (HOSPITAL_COMMUNITY): Payer: Medicare Other

## 2019-06-06 ENCOUNTER — Encounter (HOSPITAL_COMMUNITY): Payer: Medicare Other

## 2019-06-06 ENCOUNTER — Encounter (HOSPITAL_COMMUNITY): Payer: Self-pay | Admitting: *Deleted

## 2019-06-06 ENCOUNTER — Emergency Department (HOSPITAL_COMMUNITY)
Admission: EM | Admit: 2019-06-06 | Discharge: 2019-06-06 | Discharge status: Against Medical Advice | Payer: Medicare Other | Attending: Emergency Medicine | Admitting: Emergency Medicine

## 2019-06-06 DIAGNOSIS — Z79899 Other long term (current) drug therapy: Secondary | ICD-10-CM | POA: Insufficient documentation

## 2019-06-06 DIAGNOSIS — R7989 Other specified abnormal findings of blood chemistry: Secondary | ICD-10-CM | POA: Diagnosis not present

## 2019-06-06 DIAGNOSIS — C9000 Multiple myeloma not having achieved remission: Secondary | ICD-10-CM

## 2019-06-06 DIAGNOSIS — R778 Other specified abnormalities of plasma proteins: Secondary | ICD-10-CM

## 2019-06-06 DIAGNOSIS — R0602 Shortness of breath: Secondary | ICD-10-CM | POA: Insufficient documentation

## 2019-06-06 DIAGNOSIS — R079 Chest pain, unspecified: Secondary | ICD-10-CM

## 2019-06-06 DIAGNOSIS — M549 Dorsalgia, unspecified: Secondary | ICD-10-CM | POA: Diagnosis not present

## 2019-06-06 DIAGNOSIS — R0789 Other chest pain: Secondary | ICD-10-CM | POA: Diagnosis present

## 2019-06-06 LAB — BASIC METABOLIC PANEL
Anion gap: 8 (ref 5–15)
BUN: 61 mg/dL — ABNORMAL HIGH (ref 6–20)
CO2: 23 mmol/L (ref 22–32)
Calcium: 8.8 mg/dL — ABNORMAL LOW (ref 8.9–10.3)
Chloride: 107 mmol/L (ref 98–111)
Creatinine, Ser: 4.86 mg/dL — ABNORMAL HIGH (ref 0.61–1.24)
GFR calc Af Amer: 14 mL/min — ABNORMAL LOW (ref >60)
GFR calc non Af Amer: 12 mL/min — ABNORMAL LOW (ref >60)
Glucose, Bld: 92 mg/dL (ref 70–99)
Potassium: 4.3 mmol/L (ref 3.5–5.1)
Sodium: 138 mmol/L (ref 135–145)

## 2019-06-06 LAB — PROTEIN ELECTROPHORESIS, SERUM
A/G Ratio: 1.7 (ref 0.7–1.7)
Albumin ELP: 3.4 g/dL (ref 2.9–4.4)
Alpha-1-Globulin: 0.2 g/dL (ref 0.0–0.4)
Alpha-2-Globulin: 0.6 g/dL (ref 0.4–1.0)
Beta Globulin: 0.7 g/dL (ref 0.7–1.3)
Gamma Globulin: 0.5 g/dL (ref 0.4–1.8)
Globulin, Total: 2 g/dL — ABNORMAL LOW (ref 2.2–3.9)
Total Protein ELP: 5.4 g/dL — ABNORMAL LOW (ref 6.0–8.5)

## 2019-06-06 LAB — CBC
HCT: 24.6 % — ABNORMAL LOW (ref 39.0–52.0)
Hemoglobin: 8.2 g/dL — ABNORMAL LOW (ref 13.0–17.0)
MCH: 36.3 pg — ABNORMAL HIGH (ref 26.0–34.0)
MCHC: 33.3 g/dL (ref 30.0–36.0)
MCV: 108.8 fL — ABNORMAL HIGH (ref 80.0–100.0)
Platelets: 122 10*3/uL — ABNORMAL LOW (ref 150–400)
RBC: 2.26 MIL/uL — ABNORMAL LOW (ref 4.22–5.81)
RDW: 15.6 % — ABNORMAL HIGH (ref 11.5–15.5)
WBC: 5.7 10*3/uL (ref 4.0–10.5)
nRBC: 0.5 % — ABNORMAL HIGH (ref 0.0–0.2)

## 2019-06-06 LAB — TROPONIN I (HIGH SENSITIVITY): Troponin I (High Sensitivity): 37 ng/L — ABNORMAL HIGH (ref <18)

## 2019-06-06 LAB — CBG MONITORING, ED: Glucose-Capillary: 81 mg/dL (ref 70–99)

## 2019-06-06 MED ORDER — FENTANYL CITRATE (PF) 100 MCG/2ML IJ SOLN: 50.0000 ug | Freq: Once | INTRAMUSCULAR | Status: DC

## 2019-06-06 MED ORDER — FENTANYL CITRATE (PF) 100 MCG/2ML IJ SOLN
15.0000 ug | Freq: Once | INTRAMUSCULAR | Status: AC
  Administered 2019-06-06: 15 ug via INTRAVENOUS
  Filled 2019-06-06: qty 2

## 2019-06-06 MED ORDER — HEPARIN SOD (PORK) LOCK FLUSH 100 UNIT/ML IV SOLN
500.0000 [IU] | Freq: Once | INTRAVENOUS | Status: AC
  Administered 2019-06-06: 500 [IU] via INTRAVENOUS
  Filled 2019-06-06: qty 5

## 2019-06-06 NOTE — ED Notes (Signed)
PT refusing to have second Troponin drawn at this time. I offered to draw blood from his portacath access and he states I don't want to stay here any longer and stood up and started removing the cardiac telemetry leads. EDP made aware and will go talk to the patient at this time.

## 2019-06-06 NOTE — Discharge Instructions (Addendum)
Your heart enzymes are elevated.  You could be having a heart attack or blood clot in your lungs.  Return at any time if you change your mind about wanting treatment.

## 2019-06-06 NOTE — Progress Notes (Signed)
I called patient to follow up on him leaving AMA.  I advised him that I was calling to check on him and he was irate.  He was very angry and said that he had heard nurses being a "smart elic" and "I am scared of you too".  So, he said that he decided that he was leaving and they weren't doing anything else to him.    He said "You must be God" "You can't help me" You, Laylana Gerwig, the doctors, yall can't help me".  I asked him what it is that he is expecting [name] to do.  He replied " I want someone to help me fix my back. That is the issue.  The customer is always right and yall have never listened to me.  The tumor has moved around in my spine and now it has caused my pain to spread" "I am a good judge of character Nyheem Binette and I don't have faith in anyone.  I am not giving up but I don't think at times that I can go on."  " I am just tired".  "You think you know it all, Fuck it, Fuck them all Dlynn Ranes, no body can help me."  " I am tired of being in pain all the time"  I listened to the patient and let him voice his concerns.  I acknowledged the fact that he is frustrated with his care, that he feels like he isn't listened to or heard.  I acknowledged that we are trying to help him but his desires are for [name] to take the pain completely away. I told him that we should set realistic goals and he will probably never be pain free. I advised that we can help him get comfort but that will not be a 0/10 pain level. I also advised him that we are oncologist and we specialize in cancer, we can not treat spinal pain by way of surgery or intervention other than treating with pain medication or radiation that he recently had.  He said that his symptoms didn't start until he started this new treatment and he "isn't sure that it is even working because nothing has been done on time or right yet". " I feel like I need a second opinion, should I get one, maybe I should because yall aren't doing anything for me".   I told him that he could  talk with Dr. Delton Coombes on Thursday at his appointment and discuss his concerns about his care. If he feels that he wants to get a second opinion, we will be glad to refer him to his center of choice.    He thanked me for being a good listener and said since I couldn't help him he was tired of talking and said goodbye.

## 2019-06-06 NOTE — Progress Notes (Signed)
I received a call from [place], Therapist, sports from [facility].  Patient had called there reporting pain in right chest that is radiating up to his neck.  He reports numbness and tingling in his arm.  This has been going on for a few days.  She is sending the patient to our ER here at [facility] for evaluation.   I have called ER and given report to [facility], RN triage. Informed that patient will be coming from home.

## 2019-06-06 NOTE — ED Provider Notes (Signed)
[facility] EMERGENCY DEPARTMENT Provider Note   CSN: [id number] Arrival date & time: 06/06/19  0920     History Chief Complaint  Patient presents with  . Back Pain    [name] is a 59 y.o. male.  HPI   Patient has a history of multiple myeloma.  Patient is being treated with chemotherapy.  Patient was last treated on March 5 with carfilzomib.  Patient states he has been having trouble with constant pain in his back.  He is also now having pain in his chest moving towards the right side.  He is occasionally coughing and feeling some shortness of breath.  He also has had some pain down in his left leg.  Patient states he called his cancer doctor today and he was instructed to come to the ED.  Denies any fevers or chills.  No history of PE.  No prior history of MI.  Patient does smoke. Past Medical History:  Diagnosis Date  . Allergy   . Arthritis    neck and back  . Blood transfusion without reported diagnosis   . BPH (benign prostatic hyperplasia)   . Cancer [facility]) 2004   testicle  . Chronic kidney disease    kidney stones  . Left lumbar radiculopathy 06/12/2016  . 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 Medical history non-contributory    Pt has scattered thoughts and uncertain of past medical history  . 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 Stones, urinary tract   . Substance abuse ([place])    prescribed oxydcodone- 10 years    Patient Active Problem List   Diagnosis Date Noted  . Acute kidney injury superimposed on CKD ([place])   . Generalized abdominal pain   . CKD (chronic kidney disease), stage V ([place]) 07/15/2018  . Anemia   . Lower GI bleed   . Multiple myeloma not having achieved remission ([place])   . Goals of care, counseling/discussion   . Palliative care encounter   . Kappa light chain myeloma ([place]) 06/16/2018  . Lumbar epidural mass ([place])   . AKI (acute kidney injury) ([place]) 06/12/2018  . PNA (pneumonia) 06/12/2018  . Hypocalcemia 06/12/2018  . Macrocytic anemia  06/12/2018  . Pulmonary nodules 06/18/2017  . Rectal bleeding 07/09/2016  . Hemorrhoids 07/09/2016  . Chronic left-sided low back pain with left-sided sciatica 06/12/2016  . Left lumbar radiculopathy 06/12/2016  . Aortic atherosclerosis ([place]) 05/28/2016  . 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 Cervical disc disease 05/22/2016  . Kidney stones 05/22/2016  . Tobacco abuse 05/22/2016  . BPH (benign prostatic hyperplasia) 05/22/2016  . H/O: asbestos exposure 05/22/2016  . Substance abuse ([place]) 05/22/2016  . Personality disorder ([place]) 05/22/2016  . Influenza vaccine refused 05/22/2016  . Lumbar stenosis 07/24/2015  . CAROTID ARTERY STENOSIS 04/24/2010  . PVC (premature ventricular contraction) 02/25/2010    Past Surgical History:  Procedure Laterality Date  . BACK SURGERY     x5  . CERVICAL DISC SURGERY     x2  . COLONOSCOPY WITH PROPOFOL N/A 08/11/2016   Dr. Gala Romney: non-bleeding internal hemorrhoids, one 4 mm hyperplastic rectal polyp, diverticulosis in entire examined colon  . POLYPECTOMY  08/11/2016   Procedure: POLYPECTOMY;  Surgeon: [name], MD;  Location: [facility] ENDO SUITE;  Service: Endoscopy;;  colon  . PORTACATH PLACEMENT Left 09/20/2018   Procedure: INSERTION PORT-A-CATH (catheter attached left subclavian);  Surgeon: Virl Cagey, MD;  Location: [facility] ORS;  Service: General;  Laterality: Left;  . testicular cancer  2004  .  TONSILLECTOMY         Family History  Problem Relation Age of Onset  . COPD Mother   . Heart disease Mother   . Other Father        Never knew his father.  . Thyroid disease Sister   . Arthritis Sister   . Heart disease Sister        bypass  . Colon cancer Neg Hx     Social History   Tobacco Use  . Smoking status: Current Every Day Smoker    Packs/day: 1.00    Years: 40.00    Pack years: 40.00    Types: Cigarettes    Start date: 03/31/1974  . Smokeless tobacco: Never Used  Substance Use Topics  . Alcohol use: Yes    Comment:  Occasional  . Drug use: No    Types: Cocaine    Comment: Remote hx of cocaine, quit 1989    Home Medications Prior to Admission medications   Medication Sig Start Date End Date Taking? Authorizing Provider  aspirin EC 81 MG tablet Take 81 mg by mouth daily.    [provider]  bortezomib IV (VELCADE) 3.5 MG injection Inject 1.3 mg/m2 into the vein once a week.     [provider]  calcitRIOL (ROCALTROL) 0.5 MCG capsule Take 0.5 mcg by mouth daily.  10/29/18   [provider]  CARFILZOMIB IV Inject into the vein. 04/21/19   [provider]  CYCLOPHOSPHAMIDE IV Inject into the vein once a week.    [provider]  Epoetin Alfa (PROCRIT IJ) Inject as directed. Unsure of dosage- gets this once weekly    [provider]  HYDROmorphone (DILAUDID) 4 MG tablet Take 1-2 tablets (4-8 mg total) by mouth every 8 (eight) hours as needed for severe pain. 05/20/19   Derek Jack, MD  Misc. Devices MISC Please provide patient with rollaider.  Dx: multiple myeloma C90.0 07/02/18   Derek Jack, MD  Patiromer Sorbitex Calcium (VELTASSA PO) Take by mouth 2 (two) times a week.     [provider]  pomalidomide (POMALYST) 3 MG capsule Take 1 capsule (3 mg total) by mouth daily. Patient not taking: Reported on 05/26/2019 05/12/19   Derek Jack, MD  prochlorperazine (COMPAZINE) 5 MG tablet Take 1 tablet (5 mg total) by mouth every 6 (six) hours as needed for nausea or vomiting. Patient not taking: Reported on 05/26/2019 04/28/19   Derek Jack, MD  senna (SENOKOT) 8.6 MG tablet Take 1 tablet by mouth 2 (two) times daily.     [provider]  sodium bicarbonate 650 MG tablet Take 1,300 mg by mouth 2 (two) times daily.    [provider]  torsemide (DEMADEX) 20 MG tablet TAKING 1 TABLET DAILY, AND 2 TABLETS ONLY IF HIS ANKLES ARE REALLY SWOLLEN Patient not taking: Reported on 05/26/2019 10/13/18   Derek Jack, MD  valACYclovir (VALTREX) 500 MG tablet TAKE 1 TABLET BY MOUTH TWICE A DAY 02/07/19   Lockamy, Randi L, NP-C    Allergies    Oxycodone-acetaminophen, Acetaminophen, Cymbalta [duloxetine hcl], Diclofenac sodium, Diclofenac sodium, Imodium [loperamide], Lyrica [pregabalin], Morphine, Vioxx [rofecoxib], and Aleve [naproxen]  Review of Systems   Review of Systems  All other systems reviewed and are negative.   Physical Exam Updated Vital Signs BP (!) 165/82   Pulse 60   Temp 98.3 F (36.8 C)   Resp 13   Wt 78.9 kg   SpO2 100%   BMI 23.60  kg/m   Physical Exam Vitals and nursing note reviewed.  Constitutional:      General: He is not in acute distress.    Appearance: He is well-developed.  HENT:     Head: Normocephalic and atraumatic.     Right Ear: External ear normal.     Left Ear: External ear normal.  Eyes:     General: No scleral icterus.       Right eye: No discharge.        Left eye: No discharge.     Conjunctiva/sclera: Conjunctivae normal.  Neck:     Trachea: No tracheal deviation.  Cardiovascular:     Rate and Rhythm: Normal rate and regular rhythm.  Pulmonary:     Effort: Pulmonary effort is normal. No respiratory distress.     Breath sounds: Normal breath sounds. No stridor. No wheezing or rales.  Abdominal:     General: Bowel sounds are normal. There is no distension.     Palpations: Abdomen is soft.     Tenderness: There is no abdominal tenderness. There is no guarding or rebound.  Musculoskeletal:        General: No swelling or tenderness.     Cervical back: Neck supple.     Right lower leg: No edema.     Left lower leg: No edema.  Skin:    General: Skin is warm and dry.     Findings: No rash.  Neurological:     Mental Status: He is alert.     Cranial Nerves: No cranial nerve deficit (no facial droop, extraocular movements intact, no slurred speech).     Sensory: No sensory deficit.     Motor: No abnormal muscle tone or seizure activity.      Coordination: Coordination normal.     ED Results / Procedures / Treatments   Labs (all labs ordered are listed, but only abnormal results are displayed) Labs Reviewed  BASIC METABOLIC PANEL - Abnormal; Notable for the following components:      Result Value   BUN 61 (*)    Creatinine, Ser 4.86 (*)    Calcium 8.8 (*)    GFR calc non Af Amer 12 (*)    GFR calc Af Amer 14 (*)    All other components within normal limits  CBC - Abnormal; Notable for the following components:   RBC 2.26 (*)    Hemoglobin 8.2 (*)    HCT 24.6 (*)    MCV 108.8 (*)    MCH 36.3 (*)    RDW 15.6 (*)    Platelets 122 (*)    nRBC 0.5 (*)    All other components within normal limits  TROPONIN I (HIGH SENSITIVITY) - Abnormal; Notable for the following components:   Troponin I (High Sensitivity) 37 (*)    All other components within normal limits  CBG MONITORING, ED  TROPONIN I (HIGH SENSITIVITY)    EKG EKG Interpretation  Date/Time:  Monday June 06 2019 10:27:54 EST Ventricular Rate:  80 PR Interval:    QRS Duration: 99 QT Interval:  412 QTC Calculation: 402 R Axis:   57 Text Interpretation: Sinus rhythm Ventricular bigeminy , new since last tracing Confirmed by Dorie Rank [phone]) on 06/06/2019 10:30:30 AM   Radiology DG Chest Portable 1 View  Result Date: 06/06/2019 CLINICAL DATA:  Chest pain.  History of multiple myeloma EXAM: PORTABLE CHEST 1 VIEW COMPARISON:  September 20, 2018 FINDINGS: Port-A-Cath tip in superior vena cava. No pneumothorax. Lungs are  mildly hyperexpanded without edema or airspace opacity. Heart is upper normal in size with pulmonary vascularity normal. No adenopathy. No well-defined bone lesions. IMPRESSION: Port-A-Cath tip in superior vena cava. Lungs mildly hyperexpanded but clear. Stable cardiac silhouette. No adenopathy evident. Electronically Signed   By: Lowella Grip III M.D.   On: 06/06/2019 10:14    Procedures Procedures (including critical care  time)  Medications Ordered in ED Medications  heparin lock flush 100 unit/mL (has no administration in time range)  fentaNYL (SUBLIMAZE) injection 15 mcg (15 mcg Intravenous Given 06/06/19 1100)    ED Course  I have reviewed the triage vital signs and the nursing notes.  Pertinent labs & imaging results that were available during my care of the patient were reviewed by me and considered in my medical decision making (see chart for details).  Clinical Course as of Jun 05 1228  Mon Jun 06, 2019  1150 Creatinine is chronically elevated   [JK]  1150 Anemia is stable   [JK]  1150 Chest x-ray without acute finding.   [JK]  1200 Trop elevated no prior.  ?ACS, ?PE, ?chronic with his renal insuff.  VQ scan ordered, serial trop pending   [TY]  6060 Patient is very angry and frustrated.  He wants to leave.  He is standing up at the bedside asking to leave   [JK]    Clinical Course User Index [JK] Dorie Rank, MD   MDM Rules/Calculators/A&P                      Patient has a history of multiple medical problems.  He is currently being treated for multiple myeloma.  States he knows why he is having this pain. " Its the tumor in his damn spine that someone needs to take out."  I explained the patient that we were concerned about the chest pain and that it could be coming from his heart or lungs.  Patient is adamant that it is not.  I explained to him that his heart enzyme was elevated. Patient told me he is not afraid to die.  I explained to the patient that it is possible he could be having a heart attack or blood clot in his lungs and this could be fatal.  Patient became angry and asked me if I did not listen to him.  He was already aware of that and told me he was not afraid to die.  Pt has asked to leave AMA.  Final Clinical Impression(s) / ED Diagnoses Final diagnoses:  Chest pain, unspecified type  Elevated troponin  Multiple myeloma not having achieved remission [facility])    Rx / DC  Orders ED Discharge Orders    None       Dorie Rank, MD 06/06/19 1230

## 2019-06-06 NOTE — ED Triage Notes (Signed)
Pt sent by doctor for eval. Multiple complaints of back pain, chest pressure, left leg pain since last night

## 2019-06-09 ENCOUNTER — Inpatient Hospital Stay (HOSPITAL_COMMUNITY): Payer: Medicare Other

## 2019-06-09 ENCOUNTER — Other Ambulatory Visit: Payer: Self-pay

## 2019-06-09 ENCOUNTER — Inpatient Hospital Stay (HOSPITAL_BASED_OUTPATIENT_CLINIC_OR_DEPARTMENT_OTHER): Payer: Medicare Other | Admitting: Hematology

## 2019-06-09 ENCOUNTER — Encounter (HOSPITAL_COMMUNITY): Payer: Self-pay | Admitting: Hematology

## 2019-06-09 ENCOUNTER — Encounter (HOSPITAL_COMMUNITY): Payer: Self-pay | Admitting: *Deleted

## 2019-06-09 VITALS — BP 157/44 | HR 69 | Temp 97.7°F | Resp 18

## 2019-06-09 DIAGNOSIS — C9000 Multiple myeloma not having achieved remission: Secondary | ICD-10-CM

## 2019-06-09 DIAGNOSIS — Z5112 Encounter for antineoplastic immunotherapy: Secondary | ICD-10-CM | POA: Diagnosis not present

## 2019-06-09 DIAGNOSIS — N185 Chronic kidney disease, stage 5: Secondary | ICD-10-CM

## 2019-06-09 LAB — CBC WITH DIFFERENTIAL/PLATELET
Abs Immature Granulocytes: 0.03 10*3/uL (ref 0.00–0.07)
Basophils Absolute: 0 10*3/uL (ref 0.0–0.1)
Basophils Relative: 1 %
Eosinophils Absolute: 1 10*3/uL — ABNORMAL HIGH (ref 0.0–0.5)
Eosinophils Relative: 19 %
HCT: 26.9 % — ABNORMAL LOW (ref 39.0–52.0)
Hemoglobin: 9 g/dL — ABNORMAL LOW (ref 13.0–17.0)
Immature Granulocytes: 1 %
Lymphocytes Relative: 8 %
Lymphs Abs: 0.4 10*3/uL — ABNORMAL LOW (ref 0.7–4.0)
MCH: 36.9 pg — ABNORMAL HIGH (ref 26.0–34.0)
MCHC: 33.5 g/dL (ref 30.0–36.0)
MCV: 110.2 fL — ABNORMAL HIGH (ref 80.0–100.0)
Monocytes Absolute: 1.2 10*3/uL — ABNORMAL HIGH (ref 0.1–1.0)
Monocytes Relative: 22 %
Neutro Abs: 2.6 10*3/uL (ref 1.7–7.7)
Neutrophils Relative %: 49 %
Platelets: 162 10*3/uL (ref 150–400)
RBC: 2.44 MIL/uL — ABNORMAL LOW (ref 4.22–5.81)
RDW: 15.9 % — ABNORMAL HIGH (ref 11.5–15.5)
WBC: 5.3 10*3/uL (ref 4.0–10.5)
nRBC: 0 % (ref 0.0–0.2)

## 2019-06-09 LAB — COMPREHENSIVE METABOLIC PANEL
ALT: 11 U/L (ref 0–44)
AST: 13 U/L — ABNORMAL LOW (ref 15–41)
Albumin: 3.7 g/dL (ref 3.5–5.0)
Alkaline Phosphatase: 51 U/L (ref 38–126)
Anion gap: 9 (ref 5–15)
BUN: 53 mg/dL — ABNORMAL HIGH (ref 6–20)
CO2: 23 mmol/L (ref 22–32)
Calcium: 9.2 mg/dL (ref 8.9–10.3)
Chloride: 107 mmol/L (ref 98–111)
Creatinine, Ser: 4.68 mg/dL — ABNORMAL HIGH (ref 0.61–1.24)
GFR calc Af Amer: 15 mL/min — ABNORMAL LOW (ref >60)
GFR calc non Af Amer: 13 mL/min — ABNORMAL LOW (ref >60)
Glucose, Bld: 103 mg/dL — ABNORMAL HIGH (ref 70–99)
Potassium: 4.4 mmol/L (ref 3.5–5.1)
Sodium: 139 mmol/L (ref 135–145)
Total Bilirubin: 0.8 mg/dL (ref 0.3–1.2)
Total Protein: 6 g/dL — ABNORMAL LOW (ref 6.5–8.1)

## 2019-06-09 MED ORDER — SODIUM CHLORIDE 0.9% FLUSH
10.0000 mL | Freq: Once | INTRAVENOUS | Status: AC
  Administered 2019-06-09: 10 mL

## 2019-06-09 MED ORDER — ONDANSETRON HCL 4 MG/2ML IJ SOLN
4.0000 mg | Freq: Once | INTRAMUSCULAR | Status: AC
  Administered 2019-06-09: 4 mg via INTRAVENOUS
  Filled 2019-06-09: qty 2

## 2019-06-09 MED ORDER — DEXTROSE 5 % IV SOLN
27.0000 mg/m2 | Freq: Once | INTRAVENOUS | Status: AC
  Administered 2019-06-09: 56 mg via INTRAVENOUS
  Filled 2019-06-09: qty 28

## 2019-06-09 MED ORDER — SODIUM CHLORIDE 0.9 % IV SOLN: Freq: Once | INTRAVENOUS | Status: AC

## 2019-06-09 MED ORDER — HEPARIN SOD (PORK) LOCK FLUSH 100 UNIT/ML IV SOLN
500.0000 [IU] | Freq: Once | INTRAVENOUS | Status: AC
  Administered 2019-06-09: 500 [IU] via INTRAVENOUS

## 2019-06-09 MED ORDER — EPOETIN ALFA-EPBX 10000 UNIT/ML IJ SOLN
20000.0000 [IU] | Freq: Once | INTRAMUSCULAR | Status: AC
  Administered 2019-06-09: 20000 [IU] via SUBCUTANEOUS
  Filled 2019-06-09: qty 2

## 2019-06-09 MED ORDER — SODIUM CHLORIDE 0.9 % IV SOLN
40.0000 mg | Freq: Once | INTRAVENOUS | Status: AC
  Administered 2019-06-09: 40 mg via INTRAVENOUS
  Filled 2019-06-09: qty 4

## 2019-06-09 NOTE — Patient Instructions (Addendum)
[place] at [facility] Discharge Instructions  You were seen today by Dr. Delton Coombes. He talked with you about how you have been feeling. He will see you back in 2 weeks with labs, treatment and follow up.    Thank you for choosing [place] at [facility] to provide your oncology and hematology care.  To afford each patient quality time with our provider, please arrive at least 15 minutes before your scheduled appointment time.   If you have a lab appointment with the [place] please come in thru the  Main Entrance and check in at the main information desk  You need to re-schedule your appointment should you arrive 10 or more minutes late.  We strive to give you quality time with our providers, and arriving late affects you and other patients whose appointments are after yours.  Also, if you no show three or more times for appointments you may be dismissed from the clinic at the providers discretion.     Again, thank you for choosing [facility].  Our hope is that these requests will decrease the amount of time that you wait before being seen by our physicians.       _____________________________________________________________  Should you have questions after your visit to [facility], please contact our office at [phone] between the hours of 8:00 a.m. and 4:30 p.m.  Voicemails left after 4:00 p.m. will not be returned until the following business day.  For prescription refill requests, have your pharmacy contact our office and allow 72 hours.    Cancer Center Support Programs:   > Cancer Support Group  2nd Tuesday of the month 1pm-2pm, Journey Room

## 2019-06-09 NOTE — Patient Instructions (Signed)
[facility] Discharge Instructions for Patients Receiving Chemotherapy  Today you received the following chemotherapy agents   To help prevent nausea and vomiting after your treatment, we encourage you to take your nausea medication   If you develop nausea and vomiting that is not controlled by your nausea medication, call the clinic.   BELOW ARE SYMPTOMS THAT SHOULD BE REPORTED IMMEDIATELY:  *FEVER GREATER THAN 100.5 F  *CHILLS WITH OR WITHOUT FEVER  NAUSEA AND VOMITING THAT IS NOT CONTROLLED WITH YOUR NAUSEA MEDICATION  *UNUSUAL SHORTNESS OF BREATH  *UNUSUAL BRUISING OR BLEEDING  TENDERNESS IN MOUTH AND THROAT WITH OR WITHOUT PRESENCE OF ULCERS  *URINARY PROBLEMS  *BOWEL PROBLEMS  UNUSUAL RASH Items with * indicate a potential emergency and should be followed up as soon as possible.  Feel free to call the clinic should you have any questions or concerns. The clinic phone number is [phone].  Please show the CHEMO ALERT CARD at check-in to the Emergency Department and triage nurse.   

## 2019-06-09 NOTE — Progress Notes (Signed)
Patient has been assessed, vital signs and labs have been reviewed by Dr. Katragadda. ANC, Creatinine, LFTs, and Platelets are within treatment parameters per Dr. Katragadda. The patient is good to proceed with treatment at this time.  

## 2019-06-09 NOTE — Progress Notes (Signed)
[name], [place] [address]   CLINIC:  Medical Oncology/Hematology  PCP:  Rosita Fire, MD [place]  [phone]   REASON FOR VISIT: Follow-up for Multiple myeloma    BRIEF ONCOLOGIC HISTORY:  Oncology History  Kappa light chain myeloma ([place])  06/16/2018 Initial Diagnosis   Kappa light chain myeloma ([place])   06/17/2018 - 04/10/2019 Chemotherapy   The patient had palonosetron (ALOXI) injection 0.25 mg, 0.25 mg, Intravenous,  Once, 27 of 27 cycles Administration: 0.25 mg (06/17/2018), 0.25 mg (06/23/2018), 0.25 mg (06/30/2018), 0.25 mg (07/07/2018), 0.25 mg (07/14/2018), 0.25 mg (07/22/2018), 0.25 mg (07/29/2018), 0.25 mg (08/06/2018), 0.25 mg (08/13/2018), 0.25 mg (08/20/2018), 0.25 mg (08/27/2018), 0.25 mg (09/06/2018), 0.25 mg (09/13/2018), 0.25 mg (09/20/2018), 0.25 mg (09/27/2018), 0.25 mg (10/04/2018), 0.25 mg (10/11/2018), 0.25 mg (10/18/2018), 0.25 mg (10/25/2018), 0.25 mg (11/01/2018), 0.25 mg (11/08/2018), 0.25 mg (11/15/2018), 0.25 mg (11/22/2018), 0.25 mg (11/29/2018), 0.25 mg (12/08/2018), 0.25 mg (12/16/2018) bortezomib SQ (VELCADE) chemo injection 2.5 mg, 1.3 mg/m2 = 2.5 mg, Subcutaneous,  Once, 41 of 42 cycles Administration: 2.5 mg (06/17/2018), 2.5 mg (06/23/2018), 2.5 mg (06/30/2018), 2.5 mg (07/07/2018), 2.5 mg (07/14/2018), 2.5 mg (07/22/2018), 2.5 mg (07/29/2018), 2.5 mg (08/06/2018), 2.5 mg (08/13/2018), 2.5 mg (08/20/2018), 2.5 mg (08/27/2018), 2.5 mg (09/06/2018), 2.5 mg (09/13/2018), 2.5 mg (09/20/2018), 2.5 mg (09/27/2018), 2.5 mg (10/04/2018), 2.5 mg (10/11/2018), 2.5 mg (10/18/2018), 2.5 mg (10/25/2018), 2.5 mg (11/01/2018), 2.5 mg (11/08/2018), 2.5 mg (11/15/2018), 2.5 mg (11/22/2018), 2.5 mg (11/29/2018), 2.5 mg (12/08/2018), 2.5 mg (12/16/2018), 2.5 mg (12/27/2018), 2.5 mg (01/03/2019), 2.5 mg (01/10/2019), 2.5 mg (01/17/2019), 2.5 mg (01/24/2019), 2.5 mg (01/31/2019), 2.5 mg (02/07/2019), 2.5 mg (02/14/2019), 2.5 mg (02/21/2019), 2.5 mg (02/28/2019), 2.5  mg (03/07/2019), 2.5 mg (03/14/2019), 2.5 mg (03/21/2019), 2.5 mg (03/29/2019), 2.5 mg (04/04/2019) cyclophosphamide (CYTOXAN) 300 mg in sodium chloride 0.9 % 250 mL chemo infusion, 300 mg (100 % of original dose 300 mg), Intravenous,  Once, 26 of 26 cycles Dose modification: 300 mg (original dose 300 mg, Cycle 1, Reason: Change in SCr/CrCl), 500 mg (original dose 500 mg, Cycle 2, Reason: Provider Judgment), 500 mg (original dose 500 mg, Cycle 3, Reason: Change in SCr/CrCl) Administration: 300 mg (06/17/2018), 500 mg (06/23/2018), 500 mg (06/30/2018), 600 mg (07/07/2018), 600 mg (07/14/2018), 600 mg (07/22/2018), 600 mg (07/29/2018), 600 mg (08/06/2018), 600 mg (08/13/2018), 600 mg (08/20/2018), 600 mg (08/27/2018), 600 mg (09/06/2018), 600 mg (09/13/2018), 600 mg (09/20/2018), 600 mg (09/27/2018), 600 mg (10/04/2018), 600 mg (10/11/2018), 600 mg (10/18/2018), 600 mg (10/25/2018), 600 mg (11/01/2018), 600 mg (11/08/2018), 600 mg (11/15/2018), 600 mg (11/22/2018), 600 mg (11/29/2018), 600 mg (12/08/2018), 600 mg (12/16/2018)  for chemotherapy treatment.    04/21/2019 -  Chemotherapy   The patient had ondansetron (ZOFRAN) injection 4 mg, 4 mg (100 % of original dose 4 mg), Intravenous,  Once, 1 of 5 cycles Dose modification: 8 mg (original dose 4 mg, Cycle 2), 4 mg (original dose 4 mg, Cycle 2), 4 mg (original dose 4 mg, Cycle 2) Administration: 4 mg (06/09/2019) carfilzomib (KYPROLIS) 40 mg in dextrose 5 % 50 mL chemo infusion, 20 mg/m2 = 40 mg, Intravenous,  Once, 2 of 6 cycles Dose modification: 27 mg/m2 (original dose 27 mg/m2, Cycle 1, Reason: Provider Judgment) Administration: 40 mg (04/21/2019), 40 mg (04/22/2019), 60 mg (04/28/2019), 60 mg (04/29/2019), 60 mg (05/05/2019), 60 mg (05/06/2019), 56 mg (05/26/2019), 56 mg (05/27/2019), 56 mg (06/02/2019), 56 mg (06/03/2019), 56 mg (06/09/2019)  for  chemotherapy treatment.       INTERVAL HISTORY:  [name] 59 y.o. male seen for follow-up of his multiple myeloma.  He seems to be in a not a good  mood today.  He reports that he is having lots of pain in the back and right lateral and posterior ribs.  He reports that he is also constipated and had some blood in the stool.  He had history of hemorrhoids which were resected.  Also reported some nausea since last visit.  Denies any fevers or infections.  REVIEW OF SYSTEMS:  Review of Systems  Constitutional: Positive for fatigue.  Gastrointestinal: Positive for constipation, nausea and vomiting.  Musculoskeletal: Positive for back pain.  Neurological: Positive for numbness.  All other systems reviewed and are negative.    PAST MEDICAL/SURGICAL HISTORY:  Past Medical History:  Diagnosis Date  . Allergy   . Arthritis    neck and back  . Blood transfusion without reported diagnosis   . BPH (benign prostatic hyperplasia)   . Cancer [facility]) 2004   testicle  . Chronic kidney disease    kidney stones  . Left lumbar radiculopathy 06/12/2016  . 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 Medical history non-contributory    Pt has scattered thoughts and uncertain of past medical history  . 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 Stones, urinary tract   . Substance abuse ([place])    prescribed oxydcodone- 10 years   Past Surgical History:  Procedure Laterality Date  . BACK SURGERY     x5  . CERVICAL DISC SURGERY     x2  . COLONOSCOPY WITH PROPOFOL N/A 08/11/2016   Dr. Gala Romney: non-bleeding internal hemorrhoids, one 4 mm hyperplastic rectal polyp, diverticulosis in entire examined colon  . POLYPECTOMY  08/11/2016   Procedure: POLYPECTOMY;  Surgeon: [name], MD;  Location: [facility] ENDO SUITE;  Service: Endoscopy;;  colon  . PORTACATH PLACEMENT Left 09/20/2018   Procedure: INSERTION PORT-A-CATH (catheter attached left subclavian);  Surgeon: Virl Cagey, MD;  Location: [facility] ORS;  Service: General;  Laterality: Left;  . testicular cancer  2004  . TONSILLECTOMY       SOCIAL HISTORY:  Social History   Socioeconomic History  . Marital status:  Single    Spouse name: Not on file  . Number of children: 1  . Years of education: 56  . Highest education level: Not on file  Occupational History  . Occupation: retired    Comment: Psychologist, prison and probation services  . Occupation: disabled  Tobacco Use  . Smoking status: Current Every Day Smoker    Packs/day: 1.00    Years: 40.00    Pack years: 40.00    Types: Cigarettes    Start date: 03/31/1974  . Smokeless tobacco: Never Used  Substance and Sexual Activity  . Alcohol use: Yes    Comment: Occasional  . Drug use: No    Types: Cocaine    Comment: Remote hx of cocaine, quit 1989  . Sexual activity: Yes  Other Topics Concern  . Not on file  Social History Narrative   [employer] for 12 years   [employer] for 43 years      Never married   One daughter      Lives alone   Retail banker   Right-handed   Occasional caffeine use         Social Determinants of Health   Financial Resource Strain:   . Difficulty of Paying Living Expenses:  Food Insecurity:   . Worried About Charity fundraiser in the Last Year:   . Arboriculturist in the Last Year:   Transportation Needs:   . Film/video editor (Medical):   Marland Kitchen Lack of Transportation (Non-Medical):   Physical Activity:   . Days of Exercise per Week:   . Minutes of Exercise per Session:   Stress:   . Feeling of Stress :   Social Connections:   . Frequency of Communication with Friends and Family:   . Frequency of Social Gatherings with Friends and Family:   . Attends Religious Services:   . Active Member of Clubs or Organizations:   . Attends Archivist Meetings:   Marland Kitchen Marital Status:   Intimate Partner Violence: Not At Risk  . Fear of Current or Ex-Partner: No  . Emotionally Abused: No  . Physically Abused: No  . Sexually Abused: No    FAMILY HISTORY:  Family History  Problem Relation Age of Onset  . COPD Mother   . Heart disease Mother   . Other Father        Never knew his father.  . Thyroid disease  Sister   . Arthritis Sister   . Heart disease Sister        bypass  . Colon cancer Neg Hx     CURRENT MEDICATIONS:  Outpatient Encounter Medications as of 06/09/2019  Medication Sig  . aspirin EC 81 MG tablet Take 81 mg by mouth daily.  . bortezomib IV (VELCADE) 3.5 MG injection Inject 1.3 mg/m2 into the vein once a week.   . calcitRIOL (ROCALTROL) 0.5 MCG capsule Take 0.5 mcg by mouth daily.   Marland Kitchen CARFILZOMIB IV Inject into the vein.  . CYCLOPHOSPHAMIDE IV Inject into the vein once a week.  Marland Kitchen Epoetin Alfa (PROCRIT IJ) Inject as directed. Unsure of dosage- gets this once weekly  . Patiromer Sorbitex Calcium (VELTASSA PO) Take by mouth 2 (two) times a week.   . senna (SENOKOT) 8.6 MG tablet Take 1 tablet by mouth 2 (two) times daily.   . sodium bicarbonate 650 MG tablet Take 1,300 mg by mouth 2 (two) times daily.  . valACYclovir (VALTREX) 500 MG tablet TAKE 1 TABLET BY MOUTH TWICE A DAY  . HYDROmorphone (DILAUDID) 4 MG tablet Take 1-2 tablets (4-8 mg total) by mouth every 8 (eight) hours as needed for severe pain. (Patient not taking: Reported on 06/09/2019)  . Misc. Devices MISC Please provide patient with rollaider.  Dx: multiple myeloma C90.0 (Patient not taking: Reported on 06/09/2019)  . pomalidomide (POMALYST) 3 MG capsule Take 1 capsule (3 mg total) by mouth daily. (Patient not taking: Reported on 05/26/2019)  . prochlorperazine (COMPAZINE) 5 MG tablet Take 1 tablet (5 mg total) by mouth every 6 (six) hours as needed for nausea or vomiting. (Patient not taking: Reported on 05/26/2019)  . torsemide (DEMADEX) 20 MG tablet TAKING 1 TABLET DAILY, AND 2 TABLETS ONLY IF HIS ANKLES ARE REALLY SWOLLEN (Patient not taking: Reported on 05/26/2019)   No facility-administered encounter medications on file as of 06/09/2019.    ALLERGIES:  Allergies  Allergen Reactions  . Oxycodone-Acetaminophen   . Acetaminophen Nausea Only and Other (See Comments)    Elevated liver enzymes  . Cymbalta [Duloxetine  Hcl] Swelling    Facial swelling  . Diclofenac Sodium Swelling       . Diclofenac Sodium Swelling  . Imodium [Loperamide] Swelling    facial  . Lyrica [Pregabalin] Swelling  .  Morphine Other (See Comments)    Bradycardia   . Vioxx [Rofecoxib] Swelling  . Aleve [Naproxen] Swelling    eye     PHYSICAL EXAM:  ECOG Performance status: 1  Vitals:   06/09/19 0927  BP: (!) 173/62  Pulse: (!) 43  Resp: 18  Temp: (!) 97.3 F (36.3 C)  SpO2: 100%   Filed Weights   06/09/19 0927  Weight: 167 lb 6.4 oz (75.9 kg)    Physical Exam Vitals reviewed.  Constitutional:      Appearance: Normal appearance. He is normal weight.  Cardiovascular:     Rate and Rhythm: Normal rate and regular rhythm.     Heart sounds: Normal heart sounds.  Pulmonary:     Effort: Pulmonary effort is normal.     Breath sounds: Normal breath sounds.  Abdominal:     General: Bowel sounds are normal.     Palpations: Abdomen is soft.  Musculoskeletal:        General: Normal range of motion.     Right lower leg: No edema.     Left lower leg: No edema.  Skin:    General: Skin is warm and dry.  Neurological:     Mental Status: He is alert and oriented to person, place, and time. Mental status is at baseline.  Psychiatric:        Mood and Affect: Mood normal.        Behavior: Behavior normal.      LABORATORY DATA:  I have reviewed the labs as listed.  CBC    Component Value Date/Time   WBC 5.3 06/09/2019 0943   RBC 2.44 (L) 06/09/2019 0943   HGB 9.0 (L) 06/09/2019 0943   HCT 26.9 (L) 06/09/2019 0943   PLT 162 06/09/2019 0943   MCV 110.2 (H) 06/09/2019 0943   MCH 36.9 (H) 06/09/2019 0943   MCHC 33.5 06/09/2019 0943   RDW 15.9 (H) 06/09/2019 0943   LYMPHSABS 0.4 (L) 06/09/2019 0943   MONOABS 1.2 (H) 06/09/2019 0943   EOSABS 1.0 (H) 06/09/2019 0943   BASOSABS 0.0 06/09/2019 0943   CMP Latest Ref Rng & Units 06/09/2019 06/06/2019 06/02/2019  Glucose 70 - 99 mg/dL 103(H) 92 119(H)  BUN 6 - 20  mg/dL 53(H) 61(H) 51(H)  Creatinine 0.61 - 1.24 mg/dL 4.68(H) 4.86(H) 5.14(H)  Sodium 135 - 145 mmol/L 139 138 141  Potassium 3.5 - 5.1 mmol/L 4.4 4.3 4.3  Chloride 98 - 111 mmol/L 107 107 110  CO2 22 - 32 mmol/L _0 Calcium 8.9 - 10.3 mg/dL 9.2 8.8(L) 9.3  Total Protein 6.5 - 8.1 g/dL 6.0(L) - 5.7(L)  Total Bilirubin 0.3 - 1.2 mg/dL 0.8 - 0.7  Alkaline Phos 38 - 126 U/L 51 - 49  AST 15 - 41 U/L 13(L) - 12(L)  ALT 0 - 44 U/L 11 - 9      I have independently reviewed the scans.     ASSESSMENT & PLAN:   Kappa light chain myeloma ([place]) 1.  Kappa light chain myeloma, high risk, stage III: -CyBorD from 06/17/2018 through 04/04/2019 with progression. -Carfilzomib, pomalidomide and dexamethasone cycle 1 on 04/21/2019.  Cycle 2 on 05/26/2019. -He reports that he is taking Pomalyst. -I reviewed myeloma labs from 06/02/2019.  Kappa light chains are 587 and lambda light chains 18.7.  Ratio is improved to 31.42. -He will proceed with day 15 of Kyprolis today and day 16 tomorrow.  Next week will be his off week.  We  will reevaluate him in 2 weeks.  2.  Back pain: -MRI of the lumbar and thoracic spine from 04/28/2019 showed ventral epidural tumor posterior to T12 body causing mild spinal stenosis.  Mild endplate fracture of L2 suspicious for pathological fracture not seen previously. -He received XRT to the back from 05/23/2019 through 05/27/2019. -He is taking Dilaudid 4 mg 1 tablet every 4 hours.  He reports that his pain is no better.  He reports some burning pain going from the right posterior ribs to the right upper quadrant. -I had a prolonged discussion and had offered referral to back surgeon.  3.  Anemia: -Etiology is CKD and myeloma.  Slight worsening from XRT. -He will continue Retacrit.  Today hemoglobin improved to 9.0.  4.  Leg swelling: -He is taking torsemide 20 mg as needed.  5.  ID/thromboprophylaxis: -She will continue Valtrex twice daily and aspirin 81 mg daily.  6.   Nausea/vomiting: -He has experienced some nausea after treatments. -I will add Zofran 4 mg IV to the premedication 2S.  He will take Compazine every 6 hours as needed.  7.  Myeloma bone disease: -He received denosumab last year.  We will plan to start him back on it.    Orders placed this encounter:  No orders of the defined types were placed in this encounter.  Total time spent is 45 minutes with more than 80% of the time spent face-to-face discussing various treatment options for his back pain, counseling and coordination of care.   Derek Jack, MD [place] [phone]

## 2019-06-09 NOTE — Progress Notes (Signed)
Patient presents today for treatment and follow up visit with Dr. Delton Coombes. Vital signs within parameters for treatment today. Creatinine 4.68 today. Bun 53. HGB 9.0 today. Patient has complaints of chronic back pain. Patient agitated today and states " Nobody wants to help me." Affect upon assessment is flat. BP elevated today. 173/62.     Message received from [facility] LPN/ Dr. Delton Coombes proceed with treatment today. Pre med will change to IV Zofran per DWilson RN. Labs reviewed by MD.    BP recheck 136/58.   Treatment given today per MD orders. Tolerated infusion without adverse affects. Vital signs stable. No complaints at this time. Discharged from clinic ambulatory. F/U with [facility] as scheduled.

## 2019-06-09 NOTE — Progress Notes (Signed)
Patient is here today and is very aggressive verbally.  He doesn't want [name] to do anything for him but treat his cancer. He wants to see his PCP [name] and have him do the appropriate referrals.  We tried explaining to him that we can send him for a consult with a doctor that could possibly do spinal injections and he said that he just wants to let [name] take care of him since it is not oncology related.  I talked to [name] and he said that we really don't have enough to commit him or have emergency psych evaluation today. He is not SI or HI today, he is just angry and showing signs of aggression.  I was able to get in touch with his PCP and actually spoke with [name]. He said that he will see him next week and he will assess him at that time and send a referral for mental health if he feels the need is still there.  I went back in to let [name] know that he had an appointment with [name] next week and he was very appreciative and thanked me for helping him get in soon.  I explained that [name] was willing to see him and make the appropriate referrals as needed.

## 2019-06-09 NOTE — Assessment & Plan Note (Signed)
1.  Kappa light chain myeloma, high risk, stage III: -CyBorD from 06/17/2018 through 04/04/2019 with progression. -Carfilzomib, pomalidomide and dexamethasone cycle 1 on 04/21/2019.  Cycle 2 on 05/26/2019. -He reports that he is taking Pomalyst. -I reviewed myeloma labs from 06/02/2019.  Kappa light chains are 587 and lambda light chains 18.7.  Ratio is improved to 31.42. -He will proceed with day 15 of Kyprolis today and day 16 tomorrow.  Next week will be his off week.  We will reevaluate him in 2 weeks.  2.  Back pain: -MRI of the lumbar and thoracic spine from 04/28/2019 showed ventral epidural tumor posterior to T12 body causing mild spinal stenosis.  Mild endplate fracture of L2 suspicious for pathological fracture not seen previously. -He received XRT to the back from 05/23/2019 through 05/27/2019. -He is taking Dilaudid 4 mg 1 tablet every 4 hours.  He reports that his pain is no better.  He reports some burning pain going from the right posterior ribs to the right upper quadrant. -I had a prolonged discussion and had offered referral to back surgeon.  3.  Anemia: -Etiology is CKD and myeloma.  Slight worsening from XRT. -He will continue Retacrit.  Today hemoglobin improved to 9.0.  4.  Leg swelling: -He is taking torsemide 20 mg as needed.  5.  ID/thromboprophylaxis: -She will continue Valtrex twice daily and aspirin 81 mg daily.  6.  Nausea/vomiting: -He has experienced some nausea after treatments. -I will add Zofran 4 mg IV to the premedication 2S.  He will take Compazine every 6 hours as needed.  7.  Myeloma bone disease: -He received denosumab last year.  We will plan to start him back on it.

## 2019-06-10 ENCOUNTER — Inpatient Hospital Stay (HOSPITAL_COMMUNITY): Payer: Medicare Other

## 2019-06-10 ENCOUNTER — Other Ambulatory Visit (HOSPITAL_COMMUNITY): Payer: Self-pay | Admitting: *Deleted

## 2019-06-10 VITALS — BP 180/59 | HR 89 | Temp 97.9°F | Resp 18

## 2019-06-10 DIAGNOSIS — C9000 Multiple myeloma not having achieved remission: Secondary | ICD-10-CM

## 2019-06-10 DIAGNOSIS — Z5112 Encounter for antineoplastic immunotherapy: Secondary | ICD-10-CM | POA: Diagnosis not present

## 2019-06-10 MED ORDER — SODIUM CHLORIDE 0.9 % IV SOLN: Freq: Once | INTRAVENOUS | Status: AC

## 2019-06-10 MED ORDER — HEPARIN SOD (PORK) LOCK FLUSH 100 UNIT/ML IV SOLN
500.0000 [IU] | Freq: Once | INTRAVENOUS | Status: AC | PRN
  Administered 2019-06-10: 500 [IU]

## 2019-06-10 MED ORDER — ONDANSETRON HCL 4 MG/2ML IJ SOLN
4.0000 mg | Freq: Once | INTRAMUSCULAR | Status: AC
  Administered 2019-06-10: 4 mg via INTRAVENOUS

## 2019-06-10 MED ORDER — HYDROMORPHONE HCL 4 MG PO TABS: 4.0000 mg | ORAL_TABLET | Freq: Three times a day (TID) | ORAL | 0 refills | Status: DC | PRN

## 2019-06-10 MED ORDER — DEXTROSE 5 % IV SOLN
27.0000 mg/m2 | Freq: Once | INTRAVENOUS | Status: AC
  Administered 2019-06-10: 56 mg via INTRAVENOUS
  Filled 2019-06-10: qty 28

## 2019-06-10 MED ORDER — ONDANSETRON HCL 4 MG/2ML IJ SOLN
INTRAMUSCULAR | Status: AC
  Filled 2019-06-10: qty 2

## 2019-06-10 MED ORDER — SODIUM CHLORIDE 0.9% FLUSH
10.0000 mL | INTRAVENOUS | Status: DC | PRN
  Administered 2019-06-10: 10 mL

## 2019-06-10 NOTE — Progress Notes (Signed)
Patient tolerated chemotherapy with no complaints voiced.  Port site clean and dry with no bruising or swelling noted at site.  Good blood return noted before and after administration of chemotherapy.  Band aid applied.  Patient left ambulatory with VSS and no s/s of distress noted.  

## 2019-06-15 ENCOUNTER — Other Ambulatory Visit (HOSPITAL_COMMUNITY): Payer: Self-pay | Admitting: *Deleted

## 2019-06-15 DIAGNOSIS — C9000 Multiple myeloma not having achieved remission: Secondary | ICD-10-CM

## 2019-06-15 MED ORDER — POMALIDOMIDE 3 MG PO CAPS: 3.0000 mg | ORAL_CAPSULE | Freq: Every day | ORAL | 0 refills | Status: DC

## 2019-06-23 ENCOUNTER — Encounter (HOSPITAL_COMMUNITY): Payer: Self-pay | Admitting: Hematology

## 2019-06-23 ENCOUNTER — Inpatient Hospital Stay (HOSPITAL_COMMUNITY): Payer: Medicare Other

## 2019-06-23 ENCOUNTER — Other Ambulatory Visit: Payer: Self-pay

## 2019-06-23 ENCOUNTER — Inpatient Hospital Stay (HOSPITAL_BASED_OUTPATIENT_CLINIC_OR_DEPARTMENT_OTHER): Payer: Medicare Other | Admitting: Hematology

## 2019-06-23 VITALS — BP 157/59 | HR 73 | Temp 96.9°F | Resp 17 | Wt 167.2 lb

## 2019-06-23 DIAGNOSIS — C9000 Multiple myeloma not having achieved remission: Secondary | ICD-10-CM

## 2019-06-23 DIAGNOSIS — N185 Chronic kidney disease, stage 5: Secondary | ICD-10-CM

## 2019-06-23 DIAGNOSIS — Z5112 Encounter for antineoplastic immunotherapy: Secondary | ICD-10-CM | POA: Diagnosis not present

## 2019-06-23 LAB — CBC WITH DIFFERENTIAL/PLATELET
Abs Immature Granulocytes: 0.01 10*3/uL (ref 0.00–0.07)
Basophils Absolute: 0.1 10*3/uL (ref 0.0–0.1)
Basophils Relative: 3 %
Eosinophils Absolute: 0.4 10*3/uL (ref 0.0–0.5)
Eosinophils Relative: 8 %
HCT: 27.8 % — ABNORMAL LOW (ref 39.0–52.0)
Hemoglobin: 9.2 g/dL — ABNORMAL LOW (ref 13.0–17.0)
Immature Granulocytes: 0 %
Lymphocytes Relative: 16 %
Lymphs Abs: 0.8 10*3/uL (ref 0.7–4.0)
MCH: 37.4 pg — ABNORMAL HIGH (ref 26.0–34.0)
MCHC: 33.1 g/dL (ref 30.0–36.0)
MCV: 113 fL — ABNORMAL HIGH (ref 80.0–100.0)
Monocytes Absolute: 1 10*3/uL (ref 0.1–1.0)
Monocytes Relative: 21 %
Neutro Abs: 2.6 10*3/uL (ref 1.7–7.7)
Neutrophils Relative %: 52 %
Platelets: 288 10*3/uL (ref 150–400)
RBC: 2.46 MIL/uL — ABNORMAL LOW (ref 4.22–5.81)
RDW: 16.6 % — ABNORMAL HIGH (ref 11.5–15.5)
WBC: 5 10*3/uL (ref 4.0–10.5)
nRBC: 0 % (ref 0.0–0.2)

## 2019-06-23 LAB — COMPREHENSIVE METABOLIC PANEL
ALT: 11 U/L (ref 0–44)
AST: 16 U/L (ref 15–41)
Albumin: 3.7 g/dL (ref 3.5–5.0)
Alkaline Phosphatase: 60 U/L (ref 38–126)
Anion gap: 11 (ref 5–15)
BUN: 56 mg/dL — ABNORMAL HIGH (ref 6–20)
CO2: 21 mmol/L — ABNORMAL LOW (ref 22–32)
Calcium: 9.3 mg/dL (ref 8.9–10.3)
Chloride: 105 mmol/L (ref 98–111)
Creatinine, Ser: 5.1 mg/dL — ABNORMAL HIGH (ref 0.61–1.24)
GFR calc Af Amer: 13 mL/min — ABNORMAL LOW (ref >60)
GFR calc non Af Amer: 11 mL/min — ABNORMAL LOW (ref >60)
Glucose, Bld: 117 mg/dL — ABNORMAL HIGH (ref 70–99)
Potassium: 4.5 mmol/L (ref 3.5–5.1)
Sodium: 137 mmol/L (ref 135–145)
Total Bilirubin: 0.5 mg/dL (ref 0.3–1.2)
Total Protein: 6.3 g/dL — ABNORMAL LOW (ref 6.5–8.1)

## 2019-06-23 LAB — LACTATE DEHYDROGENASE: LDH: 160 U/L (ref 98–192)

## 2019-06-23 MED ORDER — HEPARIN SOD (PORK) LOCK FLUSH 100 UNIT/ML IV SOLN
500.0000 [IU] | Freq: Once | INTRAVENOUS | Status: AC | PRN
  Administered 2019-06-23: 500 [IU]

## 2019-06-23 MED ORDER — SODIUM CHLORIDE 0.9 % IV SOLN: Freq: Once | INTRAVENOUS | Status: AC

## 2019-06-23 MED ORDER — SODIUM CHLORIDE 0.9% FLUSH
10.0000 mL | INTRAVENOUS | Status: DC | PRN
  Administered 2019-06-23: 10 mL

## 2019-06-23 MED ORDER — DEXTROSE 5 % IV SOLN
27.0000 mg/m2 | Freq: Once | INTRAVENOUS | Status: AC
  Administered 2019-06-23: 56 mg via INTRAVENOUS
  Filled 2019-06-23: qty 28

## 2019-06-23 MED ORDER — ONDANSETRON HCL 4 MG/2ML IJ SOLN
4.0000 mg | Freq: Once | INTRAMUSCULAR | Status: AC
  Administered 2019-06-23: 4 mg via INTRAVENOUS
  Filled 2019-06-23: qty 2

## 2019-06-23 MED ORDER — EPOETIN ALFA-EPBX 10000 UNIT/ML IJ SOLN
20000.0000 [IU] | Freq: Once | INTRAMUSCULAR | Status: AC
  Administered 2019-06-23: 20000 [IU] via SUBCUTANEOUS
  Filled 2019-06-23: qty 2

## 2019-06-23 MED ORDER — SODIUM CHLORIDE 0.9 % IV SOLN
40.0000 mg | Freq: Once | INTRAVENOUS | Status: AC
  Administered 2019-06-23: 40 mg via INTRAVENOUS
  Filled 2019-06-23: qty 4

## 2019-06-23 NOTE — Progress Notes (Signed)
I met with patient during the visit with Dr. Delton Coombes. I helped to answer a few questions during the visit.  He denies any needs at this time.

## 2019-06-23 NOTE — Progress Notes (Signed)
Patient has been assessed, vital signs and labs have been reviewed by Dr. Katragadda. ANC, Creatinine, LFTs, and Platelets are within treatment parameters per Dr. Katragadda. The patient is good to proceed with treatment at this time.  

## 2019-06-23 NOTE — Progress Notes (Signed)
[facility] [place], [place] [address]   CLINIC:  Medical Oncology/Hematology  PCP:  Rosita Fire, MD [place] [id number] [phone]   REASON FOR VISIT: Follow-up for Multiple myeloma    BRIEF ONCOLOGIC HISTORY:  Oncology History  Kappa light chain myeloma ([place])  06/16/2018 Initial Diagnosis   Kappa light chain myeloma ([place])   06/17/2018 - 04/10/2019 Chemotherapy   The patient had palonosetron (ALOXI) injection 0.25 mg, 0.25 mg, Intravenous,  Once, 27 of 27 cycles Administration: 0.25 mg (06/17/2018), 0.25 mg (06/23/2018), 0.25 mg (06/30/2018), 0.25 mg (07/07/2018), 0.25 mg (07/14/2018), 0.25 mg (07/22/2018), 0.25 mg (07/29/2018), 0.25 mg (08/06/2018), 0.25 mg (08/13/2018), 0.25 mg (08/20/2018), 0.25 mg (08/27/2018), 0.25 mg (09/06/2018), 0.25 mg (09/13/2018), 0.25 mg (09/20/2018), 0.25 mg (09/27/2018), 0.25 mg (10/04/2018), 0.25 mg (10/11/2018), 0.25 mg (10/18/2018), 0.25 mg (10/25/2018), 0.25 mg (11/01/2018), 0.25 mg (11/08/2018), 0.25 mg (11/15/2018), 0.25 mg (11/22/2018), 0.25 mg (11/29/2018), 0.25 mg (12/08/2018), 0.25 mg (12/16/2018) bortezomib SQ (VELCADE) chemo injection 2.5 mg, 1.3 mg/m2 = 2.5 mg, Subcutaneous,  Once, 41 of 42 cycles Administration: 2.5 mg (06/17/2018), 2.5 mg (06/23/2018), 2.5 mg (06/30/2018), 2.5 mg (07/07/2018), 2.5 mg (07/14/2018), 2.5 mg (07/22/2018), 2.5 mg (07/29/2018), 2.5 mg (08/06/2018), 2.5 mg (08/13/2018), 2.5 mg (08/20/2018), 2.5 mg (08/27/2018), 2.5 mg (09/06/2018), 2.5 mg (09/13/2018), 2.5 mg (09/20/2018), 2.5 mg (09/27/2018), 2.5 mg (10/04/2018), 2.5 mg (10/11/2018), 2.5 mg (10/18/2018), 2.5 mg (10/25/2018), 2.5 mg (11/01/2018), 2.5 mg (11/08/2018), 2.5 mg (11/15/2018), 2.5 mg (11/22/2018), 2.5 mg (11/29/2018), 2.5 mg (12/08/2018), 2.5 mg (12/16/2018), 2.5 mg (12/27/2018), 2.5 mg (01/03/2019), 2.5 mg (01/10/2019), 2.5 mg (01/17/2019), 2.5 mg (01/24/2019), 2.5 mg (01/31/2019), 2.5 mg (02/07/2019), 2.5 mg (02/14/2019), 2.5 mg (02/21/2019), 2.5 mg (02/28/2019), 2.5  mg (03/07/2019), 2.5 mg (03/14/2019), 2.5 mg (03/21/2019), 2.5 mg (03/29/2019), 2.5 mg (04/04/2019) cyclophosphamide (CYTOXAN) 300 mg in sodium chloride 0.9 % 250 mL chemo infusion, 300 mg (100 % of original dose 300 mg), Intravenous,  Once, 26 of 26 cycles Dose modification: 300 mg (original dose 300 mg, Cycle 1, Reason: Change in SCr/CrCl), 500 mg (original dose 500 mg, Cycle 2, Reason: Provider Judgment), 500 mg (original dose 500 mg, Cycle 3, Reason: Change in SCr/CrCl) Administration: 300 mg (06/17/2018), 500 mg (06/23/2018), 500 mg (06/30/2018), 600 mg (07/07/2018), 600 mg (07/14/2018), 600 mg (07/22/2018), 600 mg (07/29/2018), 600 mg (08/06/2018), 600 mg (08/13/2018), 600 mg (08/20/2018), 600 mg (08/27/2018), 600 mg (09/06/2018), 600 mg (09/13/2018), 600 mg (09/20/2018), 600 mg (09/27/2018), 600 mg (10/04/2018), 600 mg (10/11/2018), 600 mg (10/18/2018), 600 mg (10/25/2018), 600 mg (11/01/2018), 600 mg (11/08/2018), 600 mg (11/15/2018), 600 mg (11/22/2018), 600 mg (11/29/2018), 600 mg (12/08/2018), 600 mg (12/16/2018)  for chemotherapy treatment.    04/21/2019 -  Chemotherapy   The patient had ondansetron (ZOFRAN) injection 4 mg, 4 mg (100 % of original dose 4 mg), Intravenous,  Once, 2 of 5 cycles Dose modification: 8 mg (original dose 4 mg, Cycle 2), 4 mg (original dose 4 mg, Cycle 2), 4 mg (original dose 4 mg, Cycle 2) Administration: 4 mg (06/09/2019), 4 mg (06/10/2019), 4 mg (06/23/2019) carfilzomib (KYPROLIS) 40 mg in dextrose 5 % 50 mL chemo infusion, 20 mg/m2 = 40 mg, Intravenous,  Once, 3 of 6 cycles Dose modification: 27 mg/m2 (original dose 27 mg/m2, Cycle 1, Reason: Provider Judgment) Administration: 40 mg (04/21/2019), 40 mg (04/22/2019), 60 mg (04/28/2019), 60 mg (04/29/2019), 60 mg (05/05/2019), 60 mg (05/06/2019), 56 mg (05/26/2019), 56 mg (05/27/2019), 56 mg (06/02/2019), 56 mg (  06/03/2019), 56 mg (06/09/2019), 56 mg (06/10/2019), 56 mg (06/23/2019)  for chemotherapy treatment.       INTERVAL HISTORY:  [name] 59 y.o. male  seen for follow-up of multiple myeloma.  Reports appetite 75%.  Energy levels are low.  Has mild dizziness when he stands up.  Reports some fatigue.  Reports better control of pain with Dilaudid which he takes every 4 hours.  Also reports better control of nausea since the last 2 treatments.  REVIEW OF SYSTEMS:  Review of Systems  Constitutional: Positive for fatigue.  Musculoskeletal: Positive for back pain.  Neurological: Positive for dizziness.  All other systems reviewed and are negative.    PAST MEDICAL/SURGICAL HISTORY:  Past Medical History:  Diagnosis Date  . Allergy   . Arthritis    neck and back  . Blood transfusion without reported diagnosis   . BPH (benign prostatic hyperplasia)   . Cancer [facility]) 2004   testicle  . Chronic kidney disease    kidney stones  . Left lumbar radiculopathy 06/12/2016  . 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 Medical history non-contributory    Pt has scattered thoughts and uncertain of past medical history  . 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 Stones, urinary tract   . Substance abuse ([place])    prescribed oxydcodone- 10 years   Past Surgical History:  Procedure Laterality Date  . BACK SURGERY     x5  . CERVICAL DISC SURGERY     x2  . COLONOSCOPY WITH PROPOFOL N/A 08/11/2016   Dr. Gala Romney: non-bleeding internal hemorrhoids, one 4 mm hyperplastic rectal polyp, diverticulosis in entire examined colon  . POLYPECTOMY  08/11/2016   Procedure: POLYPECTOMY;  Surgeon: [name], MD;  Location: [facility] ENDO SUITE;  Service: Endoscopy;;  colon  . PORTACATH PLACEMENT Left 09/20/2018   Procedure: INSERTION PORT-A-CATH (catheter attached left subclavian);  Surgeon: Virl Cagey, MD;  Location: [facility] ORS;  Service: General;  Laterality: Left;  . testicular cancer  2004  . TONSILLECTOMY       SOCIAL HISTORY:  Social History   Socioeconomic History  . Marital status: Single    Spouse name: Not on file  . Number of children: 1  . Years of  education: 12  . Highest education level: Not on file  Occupational History  . Occupation: retired    Comment: Psychologist, prison and probation services  . Occupation: disabled  Tobacco Use  . Smoking status: Current Every Day Smoker    Packs/day: 1.00    Years: 40.00    Pack years: 40.00    Types: Cigarettes    Start date: 03/31/1974  . Smokeless tobacco: Never Used  Substance and Sexual Activity  . Alcohol use: Yes    Comment: Occasional  . Drug use: No    Types: Cocaine    Comment: Remote hx of cocaine, quit 1989  . Sexual activity: Yes  Other Topics Concern  . Not on file  Social History Narrative   [employer] for 12 years   [employer] for 47 years      Never married   One daughter      Lives alone   Retail banker   Right-handed   Occasional caffeine use         Social Determinants of Health   Financial Resource Strain:   . Difficulty of Paying Living Expenses:   Food Insecurity:   . Worried About Charity fundraiser in the Last Year:   . Ran  Out of Food in the Last Year:   Transportation Needs:   . Lack of Transportation (Medical):   Marland Kitchen Lack of Transportation (Non-Medical):   Physical Activity:   . Days of Exercise per Week:   . Minutes of Exercise per Session:   Stress:   . Feeling of Stress :   Social Connections:   . Frequency of Communication with Friends and Family:   . Frequency of Social Gatherings with Friends and Family:   . Attends Religious Services:   . Active Member of Clubs or Organizations:   . Attends Archivist Meetings:   Marland Kitchen Marital Status:   Intimate Partner Violence: Not At Risk  . Fear of Current or Ex-Partner: No  . Emotionally Abused: No  . Physically Abused: No  . Sexually Abused: No    FAMILY HISTORY:  Family History  Problem Relation Age of Onset  . COPD Mother   . Heart disease Mother   . Other Father        Never knew his father.  . Thyroid disease Sister   . Arthritis Sister   . Heart disease Sister        bypass  .  Colon cancer Neg Hx     CURRENT MEDICATIONS:  Outpatient Encounter Medications as of 06/23/2019  Medication Sig  . aspirin EC 81 MG tablet Take 81 mg by mouth daily.  . bortezomib IV (VELCADE) 3.5 MG injection Inject 1.3 mg/m2 into the vein once a week.   . calcitRIOL (ROCALTROL) 0.5 MCG capsule Take 0.5 mcg by mouth daily.   Marland Kitchen CARFILZOMIB IV Inject into the vein.  . CYCLOPHOSPHAMIDE IV Inject into the vein once a week.  Marland Kitchen Epoetin Alfa (PROCRIT IJ) Inject as directed. Unsure of dosage- gets this once weekly  . Patiromer Sorbitex Calcium (VELTASSA PO) Take by mouth 2 (two) times a week.   . pomalidomide (POMALYST) 3 MG capsule Take 1 capsule (3 mg total) by mouth daily.  Marland Kitchen senna (SENOKOT) 8.6 MG tablet Take 1 tablet by mouth 2 (two) times daily.   . sodium bicarbonate 650 MG tablet Take 1,300 mg by mouth 2 (two) times daily.  . valACYclovir (VALTREX) 500 MG tablet TAKE 1 TABLET BY MOUTH TWICE A DAY  . HYDROmorphone (DILAUDID) 4 MG tablet Take 1-2 tablets (4-8 mg total) by mouth every 8 (eight) hours as needed for severe pain. (Patient not taking: Reported on 06/23/2019)  . Misc. Devices MISC Please provide patient with rollaider.  Dx: multiple myeloma C90.0 (Patient not taking: Reported on 06/09/2019)  . prochlorperazine (COMPAZINE) 5 MG tablet Take 1 tablet (5 mg total) by mouth every 6 (six) hours as needed for nausea or vomiting. (Patient not taking: Reported on 05/26/2019)  . torsemide (DEMADEX) 20 MG tablet TAKING 1 TABLET DAILY, AND 2 TABLETS ONLY IF HIS ANKLES ARE REALLY SWOLLEN (Patient not taking: Reported on 05/26/2019)   No facility-administered encounter medications on file as of 06/23/2019.    ALLERGIES:  Allergies  Allergen Reactions  . Oxycodone-Acetaminophen   . Acetaminophen Nausea Only and Other (See Comments)    Elevated liver enzymes  . Cymbalta [Duloxetine Hcl] Swelling    Facial swelling  . Diclofenac Sodium Swelling       . Diclofenac Sodium Swelling  . Imodium  [Loperamide] Swelling    facial  . Lyrica [Pregabalin] Swelling  . Morphine Other (See Comments)    Bradycardia   . Vioxx [Rofecoxib] Swelling  . Aleve [Naproxen] Swelling    eye  PHYSICAL EXAM:  ECOG Performance status: 1  There were no vitals filed for this visit. There were no vitals filed for this visit.  Physical Exam Vitals reviewed.  Constitutional:      Appearance: Normal appearance. He is normal weight.  Cardiovascular:     Rate and Rhythm: Normal rate and regular rhythm.     Heart sounds: Normal heart sounds.  Pulmonary:     Effort: Pulmonary effort is normal.     Breath sounds: Normal breath sounds.  Abdominal:     General: Bowel sounds are normal.     Palpations: Abdomen is soft.  Musculoskeletal:        General: Normal range of motion.     Right lower leg: No edema.     Left lower leg: No edema.  Skin:    General: Skin is warm and dry.  Neurological:     Mental Status: He is alert and oriented to person, place, and time. Mental status is at baseline.  Psychiatric:        Mood and Affect: Mood normal.        Behavior: Behavior normal.      LABORATORY DATA:  I have reviewed the labs as listed.  CBC    Component Value Date/Time   WBC 5.0 06/23/2019 0803   RBC 2.46 (L) 06/23/2019 0803   HGB 9.2 (L) 06/23/2019 0803   HCT 27.8 (L) 06/23/2019 0803   PLT 288 06/23/2019 0803   MCV 113.0 (H) 06/23/2019 0803   MCH 37.4 (H) 06/23/2019 0803   MCHC 33.1 06/23/2019 0803   RDW 16.6 (H) 06/23/2019 0803   LYMPHSABS 0.8 06/23/2019 0803   MONOABS 1.0 06/23/2019 0803   EOSABS 0.4 06/23/2019 0803   BASOSABS 0.1 06/23/2019 0803   CMP Latest Ref Rng & Units 06/23/2019 06/09/2019 06/06/2019  Glucose 70 - 99 mg/dL 117(H) 103(H) 92  BUN 6 - 20 mg/dL 56(H) 53(H) 61(H)  Creatinine 0.61 - 1.24 mg/dL 5.10(H) 4.68(H) 4.86(H)  Sodium 135 - 145 mmol/L 137 139 138  Potassium 3.5 - 5.1 mmol/L 4.5 4.4 4.3  Chloride 98 - 111 mmol/L 105 107 107  CO2 22 - 32 mmol/L 21(L)  23 23  Calcium 8.9 - 10.3 mg/dL 9.3 9.2 8.8(L)  Total Protein 6.5 - 8.1 g/dL 6.3(L) 6.0(L) -  Total Bilirubin 0.3 - 1.2 mg/dL 0.5 0.8 -  Alkaline Phos 38 - 126 U/L 60 51 -  AST 15 - 41 U/L 16 13(L) -  ALT 0 - 44 U/L 11 11 -      I have reviewed the scans.     ASSESSMENT & PLAN:   Kappa light chain myeloma ([place]) 1.  Kappa light chain myeloma, high risk, stage III: -CyBorD from 06/17/2018 through 04/04/2019 with progression. -Carfilzomib, pomalidomide and dexamethasone cycle 1 on 04/21/2019, cycle 2 on 05/26/2019. -Myeloma labs from 06/02/2019 showed kappa light chains 587, lambda light chains 18.7, ratio improved to 31.42.  SPEP negative. -He will start cycle 3-day 1 today.  He will start Pomalyst today.  I have reviewed his CBC. -We will see him back in 4 weeks for follow-up and repeat myeloma labs.  2.  Back pain: -MRI of the lumbar and thoracic spine from 04/28/2019 showed ventral epidural tumor posterior to T12 body causing mild spinal stenosis.  Mild endplate fracture of L2 suspicious for pathological fracture not seen previously. -He received XRT to the back from 05/22/2019 through 05/27/2019. -He is taking Dilaudid 4 mg 1 tablet every 4 hours.  He reports pain is better controlled in the last couple of weeks.  He refused referral to back surgeon.  3.  Anemia: -Etiology is CKD and myeloma. -He will continue Retacrit.  Hemoglobin today is 9.2.  4.  ID/thromboprophylaxis: -He will continue Valtrex twice daily and aspirin 81 mg daily.  5.  Nausea/vomiting: -He experienced improvement in nausea after we added Zofran to premedication for carfilzomib.  6.  Leg swelling: -He has not taken torsemide in the past 2 to 3 months.  He is avoiding salty foods.    Orders placed this encounter:  Orders Placed This Encounter  Procedures  . Protein electrophoresis, serum  . Kappa/lambda light chains  . Lactate dehydrogenase  . CBC with Differential/Platelet  . Comprehensive metabolic  panel  . Magnesium     Derek Jack, MD [place] [phone]

## 2019-06-23 NOTE — Patient Instructions (Signed)
[facility] Discharge Instructions for Patients Receiving Chemotherapy   Beginning January 23rd 2017 lab work for the Cancer Center will be done in the  Main lab at [place] [facility] on 1st floor. If you have a lab appointment with the Cancer Center please come in thru the  Main Entrance and check in at the main information desk   Today you received the following chemotherapy agents Kyprolis  To help prevent nausea and vomiting after your treatment, we encourage you to take your nausea medication If you develop nausea and vomiting, or diarrhea that is not controlled by your medication, call the clinic.  The clinic phone number is [phone]. Office hours are Monday-Friday 8:30am-5:00pm.  BELOW ARE SYMPTOMS THAT SHOULD BE REPORTED IMMEDIATELY:  *FEVER GREATER THAN 101.0 F  *CHILLS WITH OR WITHOUT FEVER  NAUSEA AND VOMITING THAT IS NOT CONTROLLED WITH YOUR NAUSEA MEDICATION  *UNUSUAL SHORTNESS OF BREATH  *UNUSUAL BRUISING OR BLEEDING  TENDERNESS IN MOUTH AND THROAT WITH OR WITHOUT PRESENCE OF ULCERS  *URINARY PROBLEMS  *BOWEL PROBLEMS  UNUSUAL RASH Items with * indicate a potential emergency and should be followed up as soon as possible. If you have an emergency after office hours please contact your primary care physician or go to the nearest emergency department.  Please call the clinic during office hours if you have any questions or concerns.   You may also contact the Patient Navigator at [phone] should you have any questions or need assistance in obtaining follow up care.      Resources For Cancer Patients and their Caregivers ? American Cancer Society: Can assist with transportation, wigs, general needs, runs Look Good Feel Better.        1-888-227-6333 ? Cancer Care: Provides financial assistance, online support groups, medication/co-pay assistance.  1-800-813-HOPE (4673) ? [facility] Assists [place] cancer  patients and their families through emotional , educational and financial support.  336-427-4357 ? [place] DSS Where to apply for food stamps, Medicaid and utility assistance. 336-342-1394 ? RCATS: Transportation to medical appointments. 336-347-2287 ? Social Security Administration: May apply for disability if have a Stage IV cancer. 336-342-7796 1-800-772-1213 ? [place] Aging, Disability and Transit Services: Assists with nutrition, care and transit needs. 336-349-2343          

## 2019-06-23 NOTE — Progress Notes (Signed)
1599- lab results and vitals reviewed, including Cr. 5.1., and patient seen by Dr. Delton Coombes who approved patient for treatment today.   [name] tolerated Kyprolis and Retacrit without incident or complaint. VSS upon completion of treatment. Discharged in satisfactory condition with follow up instructions.

## 2019-06-23 NOTE — Patient Instructions (Signed)
[address] at [facility] Discharge Instructions  You were seen today by Dr. Delton Coombes. He went over your recent lab results. Start taking the pomalyst today once you get home. He will see you back in 4 weeks for labs, treatment and follow up.   Thank you for choosing [place] at [facility] to provide your oncology and hematology care.  To afford each patient quality time with our provider, please arrive at least 15 minutes before your scheduled appointment time.   If you have a lab appointment with the [facility] please come in thru the  Main Entrance and check in at the main information desk  You need to re-schedule your appointment should you arrive 10 or more minutes late.  We strive to give you quality time with our providers, and arriving late affects you and other patients whose appointments are after yours.  Also, if you no show three or more times for appointments you may be dismissed from the clinic at the providers discretion.     Again, thank you for choosing [facility].  Our hope is that these requests will decrease the amount of time that you wait before being seen by our physicians.       _____________________________________________________________  Should you have questions after your visit to [facility], please contact our office at [phone] between the hours of 8:00 a.m. and 4:30 p.m.  Voicemails left after 4:00 p.m. will not be returned until the following business day.  For prescription refill requests, have your pharmacy contact our office and allow 72 hours.    Cancer Center Support Programs:   > Cancer Support Group  2nd Tuesday of the month 1pm-2pm, Journey Room

## 2019-06-23 NOTE — Assessment & Plan Note (Addendum)
1.  Kappa light chain myeloma, high risk, stage III: -CyBorD from 06/17/2018 through 04/04/2019 with progression. -Carfilzomib, pomalidomide and dexamethasone cycle 1 on 04/21/2019, cycle 2 on 05/26/2019. -Myeloma labs from 06/02/2019 showed kappa light chains 587, lambda light chains 18.7, ratio improved to 31.42.  SPEP negative. -He will start cycle 3-day 1 today.  He will start Pomalyst today.  I have reviewed his CBC. -We will see him back in 4 weeks for follow-up and repeat myeloma labs.  2.  Back pain: -MRI of the lumbar and thoracic spine from 04/28/2019 showed ventral epidural tumor posterior to T12 body causing mild spinal stenosis.  Mild endplate fracture of L2 suspicious for pathological fracture not seen previously. -He received XRT to the back from 05/22/2019 through 05/27/2019. -He is taking Dilaudid 4 mg 1 tablet every 4 hours.  He reports pain is better controlled in the last couple of weeks.  He refused referral to back surgeon.  3.  Anemia: -Etiology is CKD and myeloma. -He will continue Retacrit.  Hemoglobin today is 9.2.  4.  ID/thromboprophylaxis: -He will continue Valtrex twice daily and aspirin 81 mg daily.  5.  Nausea/vomiting: -He experienced improvement in nausea after we added Zofran to premedication for carfilzomib.  6.  Leg swelling: -He has not taken torsemide in the past 2 to 3 months.  He is avoiding salty foods.

## 2019-06-24 ENCOUNTER — Encounter (HOSPITAL_COMMUNITY): Payer: Self-pay

## 2019-06-24 ENCOUNTER — Inpatient Hospital Stay (HOSPITAL_COMMUNITY): Payer: Medicare Other

## 2019-06-24 VITALS — BP 176/78 | HR 96 | Temp 97.0°F | Resp 18

## 2019-06-24 DIAGNOSIS — Z5112 Encounter for antineoplastic immunotherapy: Secondary | ICD-10-CM | POA: Diagnosis not present

## 2019-06-24 DIAGNOSIS — C9000 Multiple myeloma not having achieved remission: Secondary | ICD-10-CM

## 2019-06-24 LAB — KAPPA/LAMBDA LIGHT CHAINS
Kappa free light chain: 505 mg/L — ABNORMAL HIGH (ref 3.3–19.4)
Kappa, lambda light chain ratio: 12.92 — ABNORMAL HIGH (ref 0.26–1.65)
Lambda free light chains: 39.1 mg/L — ABNORMAL HIGH (ref 5.7–26.3)

## 2019-06-24 MED ORDER — ONDANSETRON HCL 4 MG/2ML IJ SOLN
4.0000 mg | Freq: Once | INTRAMUSCULAR | Status: AC
  Administered 2019-06-24: 4 mg via INTRAVENOUS
  Filled 2019-06-24: qty 2

## 2019-06-24 MED ORDER — SODIUM CHLORIDE 0.9% FLUSH
10.0000 mL | INTRAVENOUS | Status: DC | PRN
  Administered 2019-06-24: 10 mL

## 2019-06-24 MED ORDER — SODIUM CHLORIDE 0.9 % IV SOLN: Freq: Once | INTRAVENOUS | Status: AC

## 2019-06-24 MED ORDER — DEXTROSE 5 % IV SOLN
27.0000 mg/m2 | Freq: Once | INTRAVENOUS | Status: AC
  Administered 2019-06-24: 56 mg via INTRAVENOUS
  Filled 2019-06-24: qty 28

## 2019-06-24 MED ORDER — HEPARIN SOD (PORK) LOCK FLUSH 100 UNIT/ML IV SOLN
500.0000 [IU] | Freq: Once | INTRAVENOUS | Status: AC | PRN
  Administered 2019-06-24: 500 [IU]

## 2019-06-24 NOTE — Patient Instructions (Signed)
[facility] Discharge Instructions for Patients Receiving Chemotherapy   Beginning January 23rd 2017 lab work for the Cancer Center will be done in the  Main lab at [facility] on 1st floor. If you have a lab appointment with the Cancer Center please come in thru the  Main Entrance and check in at the main information desk   Today you received the following chemotherapy agents Kyprolis. Follow-up as scheduled. Call clinic for any questions or concerns  To help prevent nausea and vomiting after your treatment, we encourage you to take your nausea medication   If you develop nausea and vomiting, or diarrhea that is not controlled by your medication, call the clinic.  The clinic phone number is [phone]. Office hours are Monday-Friday 8:30am-5:00pm.  BELOW ARE SYMPTOMS THAT SHOULD BE REPORTED IMMEDIATELY:  *FEVER GREATER THAN 101.0 F  *CHILLS WITH OR WITHOUT FEVER  NAUSEA AND VOMITING THAT IS NOT CONTROLLED WITH YOUR NAUSEA MEDICATION  *UNUSUAL SHORTNESS OF BREATH  *UNUSUAL BRUISING OR BLEEDING  TENDERNESS IN MOUTH AND THROAT WITH OR WITHOUT PRESENCE OF ULCERS  *URINARY PROBLEMS  *BOWEL PROBLEMS  UNUSUAL RASH Items with * indicate a potential emergency and should be followed up as soon as possible. If you have an emergency after office hours please contact your primary care physician or go to the nearest emergency department.  Please call the clinic during office hours if you have any questions or concerns.   You may also contact the Patient Navigator at [phone] should you have any questions or need assistance in obtaining follow up care.      Resources For Cancer Patients and their Caregivers ? American Cancer Society: Can assist with transportation, wigs, general needs, runs Look Good Feel Better.        1-888-227-6333 ? Cancer Care: Provides financial assistance, online support groups, medication/co-pay assistance.  1-800-813-HOPE  (4673) ? [facility] Assists [place] cancer patients and their families through emotional , educational and financial support.  336-427-4357 ? [place] DSS Where to apply for food stamps, Medicaid and utility assistance. 336-342-1394 ? RCATS: Transportation to medical appointments. 336-347-2287 ? Social Security Administration: May apply for disability if have a Stage IV cancer. 336-342-7796 1-800-772-1213 ? [place] Aging, Disability and Transit Services: Assists with nutrition, care and transit needs. 336-349-2343         

## 2019-06-24 NOTE — Progress Notes (Signed)
[name] tolerated Kyprolis infusion well without complaints or incident. VSS upon discharge. Pt discharged self ambulatory in satisfactory condition

## 2019-06-27 LAB — PROTEIN ELECTROPHORESIS, SERUM
A/G Ratio: 1.6 (ref 0.7–1.7)
Albumin ELP: 3.6 g/dL (ref 2.9–4.4)
Alpha-1-Globulin: 0.2 g/dL (ref 0.0–0.4)
Alpha-2-Globulin: 0.8 g/dL (ref 0.4–1.0)
Beta Globulin: 0.8 g/dL (ref 0.7–1.3)
Gamma Globulin: 0.5 g/dL (ref 0.4–1.8)
Globulin, Total: 2.3 g/dL (ref 2.2–3.9)
Total Protein ELP: 5.9 g/dL — ABNORMAL LOW (ref 6.0–8.5)

## 2019-06-29 NOTE — Progress Notes (Signed)

## 2019-07-04 ENCOUNTER — Inpatient Hospital Stay (HOSPITAL_COMMUNITY): Payer: Medicare Other | Attending: Hematology

## 2019-07-04 ENCOUNTER — Other Ambulatory Visit: Payer: Self-pay

## 2019-07-04 ENCOUNTER — Inpatient Hospital Stay (HOSPITAL_COMMUNITY): Payer: Medicare Other

## 2019-07-04 VITALS — BP 124/46 | HR 49 | Temp 97.1°F | Resp 18 | Wt 168.4 lb

## 2019-07-04 DIAGNOSIS — M549 Dorsalgia, unspecified: Secondary | ICD-10-CM | POA: Diagnosis not present

## 2019-07-04 DIAGNOSIS — D631 Anemia in chronic kidney disease: Secondary | ICD-10-CM | POA: Insufficient documentation

## 2019-07-04 DIAGNOSIS — C9000 Multiple myeloma not having achieved remission: Secondary | ICD-10-CM | POA: Diagnosis present

## 2019-07-04 DIAGNOSIS — Z5112 Encounter for antineoplastic immunotherapy: Secondary | ICD-10-CM | POA: Diagnosis present

## 2019-07-04 DIAGNOSIS — Z7982 Long term (current) use of aspirin: Secondary | ICD-10-CM | POA: Insufficient documentation

## 2019-07-04 DIAGNOSIS — R112 Nausea with vomiting, unspecified: Secondary | ICD-10-CM | POA: Insufficient documentation

## 2019-07-04 DIAGNOSIS — N189 Chronic kidney disease, unspecified: Secondary | ICD-10-CM | POA: Diagnosis not present

## 2019-07-04 LAB — COMPREHENSIVE METABOLIC PANEL
ALT: 16 U/L (ref 0–44)
AST: 15 U/L (ref 15–41)
Albumin: 3.7 g/dL (ref 3.5–5.0)
Alkaline Phosphatase: 50 U/L (ref 38–126)
Anion gap: 9 (ref 5–15)
BUN: 60 mg/dL — ABNORMAL HIGH (ref 6–20)
CO2: 22 mmol/L (ref 22–32)
Calcium: 9.1 mg/dL (ref 8.9–10.3)
Chloride: 107 mmol/L (ref 98–111)
Creatinine, Ser: 5.61 mg/dL — ABNORMAL HIGH (ref 0.61–1.24)
GFR calc Af Amer: 12 mL/min — ABNORMAL LOW (ref >60)
GFR calc non Af Amer: 10 mL/min — ABNORMAL LOW (ref >60)
Glucose, Bld: 99 mg/dL (ref 70–99)
Potassium: 4.8 mmol/L (ref 3.5–5.1)
Sodium: 138 mmol/L (ref 135–145)
Total Bilirubin: 0.7 mg/dL (ref 0.3–1.2)
Total Protein: 5.8 g/dL — ABNORMAL LOW (ref 6.5–8.1)

## 2019-07-04 LAB — CBC WITH DIFFERENTIAL/PLATELET
Abs Immature Granulocytes: 0.05 10*3/uL (ref 0.00–0.07)
Basophils Absolute: 0.1 10*3/uL (ref 0.0–0.1)
Basophils Relative: 2 %
Eosinophils Absolute: 0.6 10*3/uL — ABNORMAL HIGH (ref 0.0–0.5)
Eosinophils Relative: 10 %
HCT: 28.2 % — ABNORMAL LOW (ref 39.0–52.0)
Hemoglobin: 9.1 g/dL — ABNORMAL LOW (ref 13.0–17.0)
Immature Granulocytes: 1 %
Lymphocytes Relative: 9 %
Lymphs Abs: 0.5 10*3/uL — ABNORMAL LOW (ref 0.7–4.0)
MCH: 37 pg — ABNORMAL HIGH (ref 26.0–34.0)
MCHC: 32.3 g/dL (ref 30.0–36.0)
MCV: 114.6 fL — ABNORMAL HIGH (ref 80.0–100.0)
Monocytes Absolute: 0.8 10*3/uL (ref 0.1–1.0)
Monocytes Relative: 13 %
Neutro Abs: 3.9 10*3/uL (ref 1.7–7.7)
Neutrophils Relative %: 65 %
Platelets: 245 10*3/uL (ref 150–400)
RBC: 2.46 MIL/uL — ABNORMAL LOW (ref 4.22–5.81)
RDW: 17.2 % — ABNORMAL HIGH (ref 11.5–15.5)
WBC: 6.1 10*3/uL (ref 4.0–10.5)
nRBC: 0 % (ref 0.0–0.2)

## 2019-07-04 LAB — LACTATE DEHYDROGENASE: LDH: 128 U/L (ref 98–192)

## 2019-07-04 MED ORDER — OCTREOTIDE ACETATE 30 MG IM KIT
PACK | INTRAMUSCULAR | Status: AC
  Filled 2019-07-04: qty 1

## 2019-07-04 MED ORDER — HEPARIN SOD (PORK) LOCK FLUSH 100 UNIT/ML IV SOLN
500.0000 [IU] | Freq: Once | INTRAVENOUS | Status: AC | PRN
  Administered 2019-07-04: 12:00:00 500 [IU]

## 2019-07-04 MED ORDER — SODIUM CHLORIDE 0.9% FLUSH
10.0000 mL | INTRAVENOUS | Status: DC | PRN
  Administered 2019-07-04: 09:00:00 10 mL

## 2019-07-04 MED ORDER — SODIUM CHLORIDE 0.9 % IV SOLN: Freq: Once | INTRAVENOUS | Status: AC

## 2019-07-04 MED ORDER — DEXTROSE 5 % IV SOLN
27.0000 mg/m2 | Freq: Once | INTRAVENOUS | Status: AC
  Administered 2019-07-04: 56 mg via INTRAVENOUS
  Filled 2019-07-04: qty 28

## 2019-07-04 MED ORDER — SODIUM CHLORIDE 0.9 % IV SOLN
40.0000 mg | Freq: Once | INTRAVENOUS | Status: AC
  Administered 2019-07-04: 40 mg via INTRAVENOUS
  Filled 2019-07-04: qty 4

## 2019-07-04 MED ORDER — ONDANSETRON HCL 4 MG/2ML IJ SOLN
4.0000 mg | Freq: Once | INTRAMUSCULAR | Status: AC
  Administered 2019-07-04: 4 mg via INTRAVENOUS
  Filled 2019-07-04: qty 2

## 2019-07-04 NOTE — Progress Notes (Signed)
Serum Creatinine 5.61 today.  Dr. Delton Coombes made aware for treatment.  No orders received.   Patient tolerated chemotherapy with no complaints voiced.  Side effects with management reviewed with understanding verbalized.  Port site clean and dry with no bruising or swelling noted at site.  Good blood return noted before and after administration of chemotherapy.  Band aid applied.  Patient left ambulatory with VSS and no s/s of distress noted.

## 2019-07-05 ENCOUNTER — Inpatient Hospital Stay (HOSPITAL_COMMUNITY): Payer: Medicare Other

## 2019-07-05 ENCOUNTER — Encounter (HOSPITAL_COMMUNITY): Payer: Self-pay

## 2019-07-05 VITALS — BP 124/76 | HR 65 | Temp 97.5°F | Resp 18

## 2019-07-05 DIAGNOSIS — Z5112 Encounter for antineoplastic immunotherapy: Secondary | ICD-10-CM | POA: Diagnosis not present

## 2019-07-05 DIAGNOSIS — C9000 Multiple myeloma not having achieved remission: Secondary | ICD-10-CM

## 2019-07-05 DIAGNOSIS — N185 Chronic kidney disease, stage 5: Secondary | ICD-10-CM

## 2019-07-05 LAB — KAPPA/LAMBDA LIGHT CHAINS
Kappa free light chain: 532.1 mg/L — ABNORMAL HIGH (ref 3.3–19.4)
Kappa, lambda light chain ratio: 16.12 — ABNORMAL HIGH (ref 0.26–1.65)
Lambda free light chains: 33 mg/L — ABNORMAL HIGH (ref 5.7–26.3)

## 2019-07-05 MED ORDER — EPOETIN ALFA-EPBX 10000 UNIT/ML IJ SOLN
20000.0000 [IU] | Freq: Once | INTRAMUSCULAR | Status: AC
  Administered 2019-07-05: 20000 [IU] via SUBCUTANEOUS
  Filled 2019-07-05: qty 2

## 2019-07-05 MED ORDER — SODIUM CHLORIDE 0.9 % IV SOLN: Freq: Once | INTRAVENOUS | Status: AC

## 2019-07-05 MED ORDER — ONDANSETRON HCL 4 MG/2ML IJ SOLN
4.0000 mg | Freq: Once | INTRAMUSCULAR | Status: AC
  Administered 2019-07-05: 4 mg via INTRAVENOUS
  Filled 2019-07-05: qty 2

## 2019-07-05 MED ORDER — DEXTROSE 5 % IV SOLN
27.0000 mg/m2 | Freq: Once | INTRAVENOUS | Status: AC
  Administered 2019-07-05: 56 mg via INTRAVENOUS
  Filled 2019-07-05: qty 28

## 2019-07-05 MED ORDER — HEPARIN SOD (PORK) LOCK FLUSH 100 UNIT/ML IV SOLN
500.0000 [IU] | Freq: Once | INTRAVENOUS | Status: AC | PRN
  Administered 2019-07-05: 500 [IU]

## 2019-07-05 MED ORDER — SODIUM CHLORIDE 0.9% FLUSH
10.0000 mL | INTRAVENOUS | Status: DC | PRN
  Administered 2019-07-05: 11:00:00 10 mL

## 2019-07-05 NOTE — Patient Instructions (Addendum)
[facility] Discharge Instructions for Patients Receiving Chemotherapy   Beginning January 23rd 2017 lab work for the [facility] will be done in the  Main lab at [facility] on 1st floor. If you have a lab appointment with the [facility] please come in thru the  Main Entrance and check in at the main information desk   Today you received the following chemotherapy agents Kyprolis as well as Retacrit injection. Follow-up as scheduled. Call clinic for any questions or concerns  To help prevent nausea and vomiting after your treatment, we encourage you to take your nausea medication   If you develop nausea and vomiting, or diarrhea that is not controlled by your medication, call the clinic.  The clinic phone number is [phone]. Office hours are Monday-Friday 8:30am-5:00pm.  BELOW ARE SYMPTOMS THAT SHOULD BE REPORTED IMMEDIATELY:  *FEVER GREATER THAN 101.0 F  *CHILLS WITH OR WITHOUT FEVER  NAUSEA AND VOMITING THAT IS NOT CONTROLLED WITH YOUR NAUSEA MEDICATION  *UNUSUAL SHORTNESS OF BREATH  *UNUSUAL BRUISING OR BLEEDING  TENDERNESS IN MOUTH AND THROAT WITH OR WITHOUT PRESENCE OF ULCERS  *URINARY PROBLEMS  *BOWEL PROBLEMS  UNUSUAL RASH Items with * indicate a potential emergency and should be followed up as soon as possible. If you have an emergency after office hours please contact your primary care physician or go to the nearest emergency department.  Please call the clinic during office hours if you have any questions or concerns.   You may also contact the Patient Navigator at [phone] should you have any questions or need assistance in obtaining follow up care.      Resources For Cancer Patients and their Caregivers ? American Cancer Society: Can assist with transportation, wigs, general needs, runs Look Good Feel Better.        ([phone] ? Cancer Care: Provides financial assistance, online support groups, medication/co-pay  assistance.  1-800-813-HOPE 213 521 7463) ? [facility] Assists [place] cancer patients and their families through emotional , educational and financial support.  (859)564-8412 ? [place] DSS Where to apply for food stamps, Medicaid and utility assistance. 470-375-3251 ? [facility]: Transportation to medical appointments. 7161689752 ? Social Security Administration: May apply for disability if have a Stage IV cancer. 680-769-3163 986-223-7552 ? [employer], Disability and Transit Services: Assists with nutrition, care and transit needs. ([phone]

## 2019-07-05 NOTE — Progress Notes (Signed)
[name] tolerated [name] infusion and Retacrit injection well without complaints or incident. Hgb 9.1 today.VSS upon discharge. Pt discharged self ambulatory in satisfactory condition

## 2019-07-05 NOTE — Progress Notes (Signed)

## 2019-07-06 LAB — PROTEIN ELECTROPHORESIS, SERUM
A/G Ratio: 1.8 — ABNORMAL HIGH (ref 0.7–1.7)
Albumin ELP: 3.5 g/dL (ref 2.9–4.4)
Alpha-1-Globulin: 0.2 g/dL (ref 0.0–0.4)
Alpha-2-Globulin: 0.6 g/dL (ref 0.4–1.0)
Beta Globulin: 0.7 g/dL (ref 0.7–1.3)
Gamma Globulin: 0.5 g/dL (ref 0.4–1.8)
Globulin, Total: 2 g/dL — ABNORMAL LOW (ref 2.2–3.9)
Total Protein ELP: 5.5 g/dL — ABNORMAL LOW (ref 6.0–8.5)

## 2019-07-11 ENCOUNTER — Other Ambulatory Visit: Payer: Self-pay

## 2019-07-11 ENCOUNTER — Encounter (HOSPITAL_COMMUNITY): Payer: Self-pay | Admitting: Hematology

## 2019-07-11 ENCOUNTER — Inpatient Hospital Stay (HOSPITAL_COMMUNITY): Payer: Medicare Other

## 2019-07-11 ENCOUNTER — Inpatient Hospital Stay (HOSPITAL_BASED_OUTPATIENT_CLINIC_OR_DEPARTMENT_OTHER): Payer: Medicare Other | Admitting: Hematology

## 2019-07-11 VITALS — BP 143/81 | Wt 168.0 lb

## 2019-07-11 DIAGNOSIS — C9000 Multiple myeloma not having achieved remission: Secondary | ICD-10-CM

## 2019-07-11 DIAGNOSIS — N185 Chronic kidney disease, stage 5: Secondary | ICD-10-CM

## 2019-07-11 DIAGNOSIS — Z5112 Encounter for antineoplastic immunotherapy: Secondary | ICD-10-CM | POA: Diagnosis not present

## 2019-07-11 LAB — CBC WITH DIFFERENTIAL/PLATELET
Abs Immature Granulocytes: 0.02 10*3/uL (ref 0.00–0.07)
Basophils Absolute: 0.1 10*3/uL (ref 0.0–0.1)
Basophils Relative: 2 %
Eosinophils Absolute: 1.1 10*3/uL — ABNORMAL HIGH (ref 0.0–0.5)
Eosinophils Relative: 19 %
HCT: 26.7 % — ABNORMAL LOW (ref 39.0–52.0)
Hemoglobin: 8.9 g/dL — ABNORMAL LOW (ref 13.0–17.0)
Immature Granulocytes: 0 %
Lymphocytes Relative: 10 %
Lymphs Abs: 0.6 10*3/uL — ABNORMAL LOW (ref 0.7–4.0)
MCH: 38.4 pg — ABNORMAL HIGH (ref 26.0–34.0)
MCHC: 33.3 g/dL (ref 30.0–36.0)
MCV: 115.1 fL — ABNORMAL HIGH (ref 80.0–100.0)
Monocytes Absolute: 1.2 10*3/uL — ABNORMAL HIGH (ref 0.1–1.0)
Monocytes Relative: 20 %
Neutro Abs: 2.9 10*3/uL (ref 1.7–7.7)
Neutrophils Relative %: 49 %
Platelets: 138 10*3/uL — ABNORMAL LOW (ref 150–400)
RBC: 2.32 MIL/uL — ABNORMAL LOW (ref 4.22–5.81)
RDW: 17.2 % — ABNORMAL HIGH (ref 11.5–15.5)
WBC: 5.8 10*3/uL (ref 4.0–10.5)
nRBC: 0 % (ref 0.0–0.2)

## 2019-07-11 LAB — COMPREHENSIVE METABOLIC PANEL
ALT: 16 U/L (ref 0–44)
AST: 13 U/L — ABNORMAL LOW (ref 15–41)
Albumin: 3.6 g/dL (ref 3.5–5.0)
Alkaline Phosphatase: 53 U/L (ref 38–126)
Anion gap: 13 (ref 5–15)
BUN: 63 mg/dL — ABNORMAL HIGH (ref 6–20)
CO2: 19 mmol/L — ABNORMAL LOW (ref 22–32)
Calcium: 8.9 mg/dL (ref 8.9–10.3)
Chloride: 104 mmol/L (ref 98–111)
Creatinine, Ser: 5.76 mg/dL — ABNORMAL HIGH (ref 0.61–1.24)
GFR calc Af Amer: 11 mL/min — ABNORMAL LOW (ref >60)
GFR calc non Af Amer: 10 mL/min — ABNORMAL LOW (ref >60)
Glucose, Bld: 80 mg/dL (ref 70–99)
Potassium: 4.6 mmol/L (ref 3.5–5.1)
Sodium: 136 mmol/L (ref 135–145)
Total Bilirubin: 0.9 mg/dL (ref 0.3–1.2)
Total Protein: 5.7 g/dL — ABNORMAL LOW (ref 6.5–8.1)

## 2019-07-11 LAB — MAGNESIUM: Magnesium: 2.2 mg/dL (ref 1.7–2.4)

## 2019-07-11 LAB — LACTATE DEHYDROGENASE: LDH: 146 U/L (ref 98–192)

## 2019-07-11 MED ORDER — SODIUM CHLORIDE 0.9 % IV SOLN: Freq: Once | INTRAVENOUS | Status: AC

## 2019-07-11 MED ORDER — ONDANSETRON HCL 4 MG/2ML IJ SOLN
4.0000 mg | Freq: Once | INTRAMUSCULAR | Status: AC
  Administered 2019-07-11: 12:00:00 4 mg via INTRAVENOUS
  Filled 2019-07-11: qty 2

## 2019-07-11 MED ORDER — SODIUM CHLORIDE 0.9% FLUSH
10.0000 mL | INTRAVENOUS | Status: DC | PRN
  Administered 2019-07-11: 10:00:00 10 mL

## 2019-07-11 MED ORDER — SODIUM CHLORIDE 0.9 % IV SOLN
40.0000 mg | Freq: Once | INTRAVENOUS | Status: AC
  Administered 2019-07-11: 11:00:00 40 mg via INTRAVENOUS
  Filled 2019-07-11: qty 4

## 2019-07-11 MED ORDER — HYDROMORPHONE HCL 4 MG PO TABS: 4.0000 mg | ORAL_TABLET | ORAL | 0 refills | Status: DC | PRN

## 2019-07-11 MED ORDER — DEXTROSE 5 % IV SOLN
27.0000 mg/m2 | Freq: Once | INTRAVENOUS | Status: AC
  Administered 2019-07-11: 56 mg via INTRAVENOUS
  Filled 2019-07-11: qty 28

## 2019-07-11 MED ORDER — EPOETIN ALFA-EPBX 10000 UNIT/ML IJ SOLN
20000.0000 [IU] | Freq: Once | INTRAMUSCULAR | Status: AC
  Administered 2019-07-11: 20000 [IU] via SUBCUTANEOUS
  Filled 2019-07-11: qty 2

## 2019-07-11 MED ORDER — HEPARIN SOD (PORK) LOCK FLUSH 100 UNIT/ML IV SOLN
500.0000 [IU] | Freq: Once | INTRAVENOUS | Status: AC | PRN
  Administered 2019-07-11: 500 [IU]

## 2019-07-11 NOTE — Patient Instructions (Addendum)
[place] at [facility] Discharge Instructions  You were seen today by Dr. Delton Coombes. He went over your recent lab results. He will see you back on the 26th for labs, treatment and follow up.   Thank you for choosing [place] at [facility] to provide your oncology and hematology care.  To afford each patient quality time with our provider, please arrive at least 15 minutes before your scheduled appointment time.   If you have a lab appointment with the [facility] please come in thru the  Main Entrance and check in at the main information desk  You need to re-schedule your appointment should you arrive 10 or more minutes late.  We strive to give you quality time with our providers, and arriving late affects you and other patients whose appointments are after yours.  Also, if you no show three or more times for appointments you may be dismissed from the clinic at the providers discretion.     Again, thank you for choosing [facility].  Our hope is that these requests will decrease the amount of time that you wait before being seen by our physicians.       _____________________________________________________________  Should you have questions after your visit to [facility], please contact our office at [phone] between the hours of 8:00 a.m. and 4:30 p.m.  Voicemails left after 4:00 p.m. will not be returned until the following business day.  For prescription refill requests, have your pharmacy contact our office and allow 72 hours.    Cancer Center Support Programs:   > Cancer Support Group  2nd Tuesday of the month 1pm-2pm, Journey Room

## 2019-07-11 NOTE — Patient Instructions (Signed)
[facility] Discharge Instructions for Patients Receiving Chemotherapy  Today you received the following chemotherapy agents   To help prevent nausea and vomiting after your treatment, we encourage you to take your nausea medication   If you develop nausea and vomiting that is not controlled by your nausea medication, call the clinic.   BELOW ARE SYMPTOMS THAT SHOULD BE REPORTED IMMEDIATELY:  *FEVER GREATER THAN 100.5 F  *CHILLS WITH OR WITHOUT FEVER  NAUSEA AND VOMITING THAT IS NOT CONTROLLED WITH YOUR NAUSEA MEDICATION  *UNUSUAL SHORTNESS OF BREATH  *UNUSUAL BRUISING OR BLEEDING  TENDERNESS IN MOUTH AND THROAT WITH OR WITHOUT PRESENCE OF ULCERS  *URINARY PROBLEMS  *BOWEL PROBLEMS  UNUSUAL RASH Items with * indicate a potential emergency and should be followed up as soon as possible.  Feel free to call the clinic should you have any questions or concerns. The clinic phone number is [phone].  Please show the CHEMO ALERT CARD at check-in to the Emergency Department and triage nurse.   

## 2019-07-11 NOTE — Progress Notes (Signed)
Labs reviewed today . BUN/Creatinine noted by MD. Will proceed with treatment as planned and will give retacrit per orders.   Treatment given per orders. Patient tolerated it well without problems. Vitals stable and discharged home from clinic ambulatory. Follow up as scheduled.

## 2019-07-11 NOTE — Progress Notes (Signed)
Patient has been assessed, vital signs and labs have been reviewed by Dr. Delton Coombes. ANC, Creatinine, LFTs, and Platelets are within treatment parameters per Dr. Delton Coombes. The patient is good to proceed with treatment at this time. Please give Retacrit as well today per Dr. Delton Coombes.

## 2019-07-11 NOTE — Assessment & Plan Note (Signed)
1.  Kappa light chain myeloma, high risk, stage III: -CyBorD from 06/17/2018 through 04/04/2019 with progression. -Carfilzomib, pomalidomide and dexamethasone cycle 1 on 04/21/2019, cycle 2 on 05/26/2019, cycle 3 on 06/23/2019. -We reviewed myeloma labs from 07/04/2019.  SPEP is negative.  Kappa light chains are 532, previously 505.  Ratio is 16.12, previously 12.92. -I have reviewed his CBC.  Platelets are slightly low at 138.  Creatinine is 5.76. -He will proceed with his day 15 of carfilzomib today.  He has 4 more pills of Pomalyst. -He will come back on 07/25/2019 to start his cycle 4.  He has reported generalized itching.  This can be likely from uremia.  We will keep a close eye on it.  2.  Back pain: -MRI of the lumbar and thoracic spine from 04/28/2019 showed ventral epidural tumor posterior to T12 body, causing mild spinal stenosis.  Mild endplate fracture of L2 suspicious for pathological fracture not seen previously. -XRT to the back from 05/22/2019 through 05/27/2019. -He is doing Dilaudid 4 mg 1 tablet every 4 hours which is helping.  I have sent a refill today.  3.  Anemia: -Etiology is CKD and myeloma. -Hemoglobin today is 8.9.  We will continue Retacrit 20,000 units as needed.  4.  ID/thromboprophylaxis: -he will continue Valtrex twice daily.  Aspirin 81 mg daily will be continued.  5.  Nausea/vomiting: -His nausea has improved since we added Zofran as premedication to carfilzomib.  He has very minor nausea but denied any vomiting.  6.  Leg swelling: -He has not taken torsemide in the past few months.  He is avoiding salty foods.  Legs are staying without swelling.

## 2019-07-11 NOTE — Progress Notes (Signed)
[name], [place] [address]   CLINIC:  Medical Oncology/Hematology  PCP:  Rosita Fire, MD [place] [address] [phone]   REASON FOR VISIT: Follow-up for Multiple myeloma    BRIEF ONCOLOGIC HISTORY:  Oncology History  Kappa light chain myeloma ([place])  06/16/2018 Initial Diagnosis   Kappa light chain myeloma ([employer])   06/17/2018 - 04/10/2019 Chemotherapy   The patient had palonosetron (ALOXI) injection 0.25 mg, 0.25 mg, Intravenous,  Once, 27 of 27 cycles Administration: 0.25 mg (06/17/2018), 0.25 mg (06/23/2018), 0.25 mg (06/30/2018), 0.25 mg (07/07/2018), 0.25 mg (07/14/2018), 0.25 mg (07/22/2018), 0.25 mg (07/29/2018), 0.25 mg (08/06/2018), 0.25 mg (08/13/2018), 0.25 mg (08/20/2018), 0.25 mg (08/27/2018), 0.25 mg (09/06/2018), 0.25 mg (09/13/2018), 0.25 mg (09/20/2018), 0.25 mg (09/27/2018), 0.25 mg (10/04/2018), 0.25 mg (10/11/2018), 0.25 mg (10/18/2018), 0.25 mg (10/25/2018), 0.25 mg (11/01/2018), 0.25 mg (11/08/2018), 0.25 mg (11/15/2018), 0.25 mg (11/22/2018), 0.25 mg (11/29/2018), 0.25 mg (12/08/2018), 0.25 mg (12/16/2018) bortezomib SQ (VELCADE) chemo injection 2.5 mg, 1.3 mg/m2 = 2.5 mg, Subcutaneous,  Once, 41 of 42 cycles Administration: 2.5 mg (06/17/2018), 2.5 mg (06/23/2018), 2.5 mg (06/30/2018), 2.5 mg (07/07/2018), 2.5 mg (07/14/2018), 2.5 mg (07/22/2018), 2.5 mg (07/29/2018), 2.5 mg (08/06/2018), 2.5 mg (08/13/2018), 2.5 mg (08/20/2018), 2.5 mg (08/27/2018), 2.5 mg (09/06/2018), 2.5 mg (09/13/2018), 2.5 mg (09/20/2018), 2.5 mg (09/27/2018), 2.5 mg (10/04/2018), 2.5 mg (10/11/2018), 2.5 mg (10/18/2018), 2.5 mg (10/25/2018), 2.5 mg (11/01/2018), 2.5 mg (11/08/2018), 2.5 mg (11/15/2018), 2.5 mg (11/22/2018), 2.5 mg (11/29/2018), 2.5 mg (12/08/2018), 2.5 mg (12/16/2018), 2.5 mg (12/27/2018), 2.5 mg (01/03/2019), 2.5 mg (01/10/2019), 2.5 mg (01/17/2019), 2.5 mg (01/24/2019), 2.5 mg (01/31/2019), 2.5 mg (02/07/2019), 2.5 mg (02/14/2019), 2.5 mg (02/21/2019), 2.5 mg (02/28/2019), 2.5  mg (03/07/2019), 2.5 mg (03/14/2019), 2.5 mg (03/21/2019), 2.5 mg (03/29/2019), 2.5 mg (04/04/2019) cyclophosphamide (CYTOXAN) 300 mg in sodium chloride 0.9 % 250 mL chemo infusion, 300 mg (100 % of original dose 300 mg), Intravenous,  Once, 26 of 26 cycles Dose modification: 300 mg (original dose 300 mg, Cycle 1, Reason: Change in SCr/CrCl), 500 mg (original dose 500 mg, Cycle 2, Reason: Provider Judgment), 500 mg (original dose 500 mg, Cycle 3, Reason: Change in SCr/CrCl) Administration: 300 mg (06/17/2018), 500 mg (06/23/2018), 500 mg (06/30/2018), 600 mg (07/07/2018), 600 mg (07/14/2018), 600 mg (07/22/2018), 600 mg (07/29/2018), 600 mg (08/06/2018), 600 mg (08/13/2018), 600 mg (08/20/2018), 600 mg (08/27/2018), 600 mg (09/06/2018), 600 mg (09/13/2018), 600 mg (09/20/2018), 600 mg (09/27/2018), 600 mg (10/04/2018), 600 mg (10/11/2018), 600 mg (10/18/2018), 600 mg (10/25/2018), 600 mg (11/01/2018), 600 mg (11/08/2018), 600 mg (11/15/2018), 600 mg (11/22/2018), 600 mg (11/29/2018), 600 mg (12/08/2018), 600 mg (12/16/2018)  for chemotherapy treatment.    04/21/2019 -  Chemotherapy   The patient had ondansetron (ZOFRAN) injection 4 mg, 4 mg (100 % of original dose 4 mg), Intravenous,  Once, 2 of 5 cycles Dose modification: 8 mg (original dose 4 mg, Cycle 2), 4 mg (original dose 4 mg, Cycle 2), 4 mg (original dose 4 mg, Cycle 2) Administration: 4 mg (06/09/2019), 4 mg (06/10/2019), 4 mg (06/23/2019), 4 mg (06/24/2019), 4 mg (07/04/2019), 4 mg (07/05/2019), 4 mg (07/11/2019) carfilzomib (KYPROLIS) 40 mg in dextrose 5 % 50 mL chemo infusion, 20 mg/m2 = 40 mg, Intravenous,  Once, 3 of 6 cycles Dose modification: 27 mg/m2 (original dose 27 mg/m2, Cycle 1, Reason: Provider Judgment) Administration: 40 mg (04/21/2019), 40 mg (04/22/2019), 60 mg (04/28/2019), 60 mg (04/29/2019), 60 mg (05/05/2019), 60 mg (  05/06/2019), 56 mg (05/26/2019), 56 mg (05/27/2019), 56 mg (06/02/2019), 56 mg (06/03/2019), 56 mg (06/09/2019), 56 mg (06/10/2019), 56 mg (06/23/2019), 56 mg (06/24/2019),  56 mg (07/04/2019), 56 mg (07/05/2019), 56 mg (07/11/2019)  for chemotherapy treatment.       INTERVAL HISTORY:  [name] 59 y.o. male seen for follow-up of multiple myeloma and toxicity assessment prior to next treatment with carfilzomib.  Appetite and energy levels are 75%.  Denies any fevers or chills.  Very rare nausea but denied any vomiting.  Reported some itching which is generalized.  No new pains reported.  Back pain is very well controlled with Dilaudid every 4 hours.  He is requesting refill for it.  REVIEW OF SYSTEMS:  Review of Systems  Gastrointestinal: Positive for nausea.  Musculoskeletal: Positive for back pain.  Skin: Positive for itching.  All other systems reviewed and are negative.    PAST MEDICAL/SURGICAL HISTORY:  Past Medical History:  Diagnosis Date  . Allergy   . Arthritis    neck and back  . Blood transfusion without reported diagnosis   . BPH (benign prostatic hyperplasia)   . Cancer [facility]) 2004   testicle  . Chronic kidney disease    kidney stones  . Left lumbar radiculopathy 06/12/2016  . 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 Medical history non-contributory    Pt has scattered thoughts and uncertain of past medical history  . 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 Stones, urinary tract   . Substance abuse ([place])    prescribed oxydcodone- 10 years   Past Surgical History:  Procedure Laterality Date  . BACK SURGERY     x5  . CERVICAL DISC SURGERY     x2  . COLONOSCOPY WITH PROPOFOL N/A 08/11/2016   Dr. Gala Romney: non-bleeding internal hemorrhoids, one 4 mm hyperplastic rectal polyp, diverticulosis in entire examined colon  . POLYPECTOMY  08/11/2016   Procedure: POLYPECTOMY;  Surgeon: [name], MD;  Location: [facility] ENDO SUITE;  Service: Endoscopy;;  colon  . PORTACATH PLACEMENT Left 09/20/2018   Procedure: INSERTION PORT-A-CATH (catheter attached left subclavian);  Surgeon: Virl Cagey, MD;  Location: [facility] ORS;  Service: General;  Laterality:  Left;  . testicular cancer  2004  . TONSILLECTOMY       SOCIAL HISTORY:  Social History   Socioeconomic History  . Marital status: Single    Spouse name: Not on file  . Number of children: 1  . Years of education: 6  . Highest education level: Not on file  Occupational History  . Occupation: retired    Comment: Psychologist, prison and probation services  . Occupation: disabled  Tobacco Use  . Smoking status: Current Every Day Smoker    Packs/day: 1.00    Years: 40.00    Pack years: 40.00    Types: Cigarettes    Start date: 03/31/1974  . Smokeless tobacco: Never Used  Substance and Sexual Activity  . Alcohol use: Yes    Comment: Occasional  . Drug use: No    Types: Cocaine    Comment: Remote hx of cocaine, quit 1989  . Sexual activity: Yes  Other Topics Concern  . Not on file  Social History Narrative   [employer] for 12 years   [employer] for 15 years      Never married   One daughter      Lives alone   Collect stamps   Plays trumpet   Right-handed   Occasional caffeine use  Social Determinants of Health   Financial Resource Strain:   . Difficulty of Paying Living Expenses:   Food Insecurity:   . Worried About Charity fundraiser in the Last Year:   . Arboriculturist in the Last Year:   Transportation Needs:   . Film/video editor (Medical):   Marland Kitchen Lack of Transportation (Non-Medical):   Physical Activity:   . Days of Exercise per Week:   . Minutes of Exercise per Session:   Stress:   . Feeling of Stress :   Social Connections:   . Frequency of Communication with Friends and Family:   . Frequency of Social Gatherings with Friends and Family:   . Attends Religious Services:   . Active Member of Clubs or Organizations:   . Attends Archivist Meetings:   Marland Kitchen Marital Status:   Intimate Partner Violence: Not At Risk  . Fear of Current or Ex-Partner: No  . Emotionally Abused: No  . Physically Abused: No  . Sexually Abused: No    FAMILY HISTORY:  Family History   Problem Relation Age of Onset  . COPD Mother   . Heart disease Mother   . Other Father        Never knew his father.  . Thyroid disease Sister   . Arthritis Sister   . Heart disease Sister        bypass  . Colon cancer Neg Hx     CURRENT MEDICATIONS:  Outpatient Encounter Medications as of 07/11/2019  Medication Sig  . aspirin EC 81 MG tablet Take 81 mg by mouth daily.  . bortezomib IV (VELCADE) 3.5 MG injection Inject 1.3 mg/m2 into the vein once a week.   . calcitRIOL (ROCALTROL) 0.5 MCG capsule Take 0.5 mcg by mouth daily.   Marland Kitchen CARFILZOMIB IV Inject into the vein.  . CYCLOPHOSPHAMIDE IV Inject into the vein once a week.  Marland Kitchen Epoetin Alfa (PROCRIT IJ) Inject as directed. Unsure of dosage- gets this once weekly  . Patiromer Sorbitex Calcium (VELTASSA PO) Take by mouth 2 (two) times a week.   . pomalidomide (POMALYST) 3 MG capsule Take 1 capsule (3 mg total) by mouth daily.  Marland Kitchen senna (SENOKOT) 8.6 MG tablet Take 1 tablet by mouth 2 (two) times daily.   . sodium bicarbonate 650 MG tablet Take 1,300 mg by mouth 2 (two) times daily.  . valACYclovir (VALTREX) 500 MG tablet TAKE 1 TABLET BY MOUTH TWICE A DAY  . HYDROmorphone (DILAUDID) 4 MG tablet Take 1 tablet (4 mg total) by mouth every 4 (four) hours as needed for severe pain.  . Misc. Devices MISC Please provide patient with rollaider.  Dx: multiple myeloma C90.0 (Patient not taking: Reported on 06/09/2019)  . prochlorperazine (COMPAZINE) 5 MG tablet Take 1 tablet (5 mg total) by mouth every 6 (six) hours as needed for nausea or vomiting. (Patient not taking: Reported on 05/26/2019)  . torsemide (DEMADEX) 20 MG tablet TAKING 1 TABLET DAILY, AND 2 TABLETS ONLY IF HIS ANKLES ARE REALLY SWOLLEN (Patient not taking: Reported on 05/26/2019)  . [DISCONTINUED] HYDROmorphone (DILAUDID) 4 MG tablet Take 1-2 tablets (4-8 mg total) by mouth every 8 (eight) hours as needed for severe pain. (Patient not taking: Reported on 06/23/2019)   No  facility-administered encounter medications on file as of 07/11/2019.    ALLERGIES:  Allergies  Allergen Reactions  . Oxycodone-Acetaminophen   . Acetaminophen Nausea Only and Other (See Comments)    Elevated liver enzymes  .  Cymbalta [Duloxetine Hcl] Swelling    Facial swelling  . Diclofenac Sodium Swelling       . Diclofenac Sodium Swelling  . Imodium [Loperamide] Swelling    facial  . Lyrica [Pregabalin] Swelling  . Morphine Other (See Comments)    Bradycardia   . Vioxx [Rofecoxib] Swelling  . Aleve [Naproxen] Swelling    eye     PHYSICAL EXAM:  ECOG Performance status: 1  Vitals:   07/11/19 0957  BP: (!) 123/92  Pulse: (!) 44  Resp: 16  Temp: (!) 96.9 F (36.1 C)  SpO2: 100%   There were no vitals filed for this visit.  Physical Exam Vitals reviewed.  Constitutional:      Appearance: Normal appearance. He is normal weight.  Cardiovascular:     Rate and Rhythm: Normal rate and regular rhythm.     Heart sounds: Normal heart sounds.  Pulmonary:     Effort: Pulmonary effort is normal.     Breath sounds: Normal breath sounds.  Abdominal:     General: Bowel sounds are normal.     Palpations: Abdomen is soft.  Musculoskeletal:        General: Normal range of motion.     Right lower leg: No edema.     Left lower leg: No edema.  Skin:    General: Skin is warm and dry.  Neurological:     Mental Status: He is alert and oriented to person, place, and time. Mental status is at baseline.  Psychiatric:        Mood and Affect: Mood normal.        Behavior: Behavior normal.      LABORATORY DATA:  I have reviewed the labs as listed.  CBC    Component Value Date/Time   WBC 5.8 07/11/2019 0950   RBC 2.32 (L) 07/11/2019 0950   HGB 8.9 (L) 07/11/2019 0950   HCT 26.7 (L) 07/11/2019 0950   PLT 138 (L) 07/11/2019 0950   MCV 115.1 (H) 07/11/2019 0950   MCH 38.4 (H) 07/11/2019 0950   MCHC 33.3 07/11/2019 0950   RDW 17.2 (H) 07/11/2019 0950   LYMPHSABS 0.6  (L) 07/11/2019 0950   MONOABS 1.2 (H) 07/11/2019 0950   EOSABS 1.1 (H) 07/11/2019 0950   BASOSABS 0.1 07/11/2019 0950   CMP Latest Ref Rng & Units 07/11/2019 07/04/2019 06/23/2019  Glucose 70 - 99 mg/dL 80 99 117(H)  BUN 6 - 20 mg/dL 63(H) 60(H) 56(H)  Creatinine 0.61 - 1.24 mg/dL 5.76(H) 5.61(H) 5.10(H)  Sodium 135 - 145 mmol/L 136 138 137  Potassium 3.5 - 5.1 mmol/L 4.6 4.8 4.5  Chloride 98 - 111 mmol/L 104 107 105  CO2 22 - 32 mmol/L 19(L) 22 21(L)  Calcium 8.9 - 10.3 mg/dL 8.9 9.1 9.3  Total Protein 6.5 - 8.1 g/dL 5.7(L) 5.8(L) 6.3(L)  Total Bilirubin 0.3 - 1.2 mg/dL 0.9 0.7 0.5  Alkaline Phos 38 - 126 U/L 53 50 60  AST 15 - 41 U/L 13(L) 15 16  ALT 0 - 44 U/L 16 16 11       I have reviewed the scans.     ASSESSMENT & PLAN:   Kappa light chain myeloma ([place]) 1.  Kappa light chain myeloma, high risk, stage III: -CyBorD from 06/17/2018 through 04/04/2019 with progression. -Carfilzomib, pomalidomide and dexamethasone cycle 1 on 04/21/2019, cycle 2 on 05/26/2019, cycle 3 on 06/23/2019. -We reviewed myeloma labs from 07/04/2019.  SPEP is negative.  Kappa light chains are 532, previously 505.  Ratio is 16.12, previously 12.92. -I have reviewed his CBC.  Platelets are slightly low at 138.  Creatinine is 5.76. -He will proceed with his day 15 of carfilzomib today.  He has 4 more pills of Pomalyst. -He will come back on 07/25/2019 to start his cycle 4.  He has reported generalized itching.  This can be likely from uremia.  We will keep a close eye on it.  2.  Back pain: -MRI of the lumbar and thoracic spine from 04/28/2019 showed ventral epidural tumor posterior to T12 body, causing mild spinal stenosis.  Mild endplate fracture of L2 suspicious for pathological fracture not seen previously. -XRT to the back from 05/22/2019 through 05/27/2019. -He is doing Dilaudid 4 mg 1 tablet every 4 hours which is helping.  I have sent a refill today.  3.  Anemia: -Etiology is CKD and myeloma. -Hemoglobin  today is 8.9.  We will continue Retacrit 20,000 units as needed.  4.  ID/thromboprophylaxis: -he will continue Valtrex twice daily.  Aspirin 81 mg daily will be continued.  5.  Nausea/vomiting: -His nausea has improved since we added Zofran as premedication to carfilzomib.  He has very minor nausea but denied any vomiting.  6.  Leg swelling: -He has not taken torsemide in the past few months.  He is avoiding salty foods.  Legs are staying without swelling.    Orders placed this encounter:  No orders of the defined types were placed in this encounter.    Derek Jack, MD [place] [phone]

## 2019-07-12 ENCOUNTER — Inpatient Hospital Stay (HOSPITAL_COMMUNITY): Payer: Medicare Other

## 2019-07-12 ENCOUNTER — Other Ambulatory Visit (HOSPITAL_COMMUNITY): Payer: Self-pay | Admitting: *Deleted

## 2019-07-12 VITALS — BP 142/87 | HR 77 | Temp 96.9°F | Resp 18

## 2019-07-12 DIAGNOSIS — Z5112 Encounter for antineoplastic immunotherapy: Secondary | ICD-10-CM | POA: Diagnosis not present

## 2019-07-12 DIAGNOSIS — C9000 Multiple myeloma not having achieved remission: Secondary | ICD-10-CM

## 2019-07-12 LAB — PROTEIN ELECTROPHORESIS, SERUM
A/G Ratio: 1.5 (ref 0.7–1.7)
Albumin ELP: 3.1 g/dL (ref 2.9–4.4)
Alpha-1-Globulin: 0.2 g/dL (ref 0.0–0.4)
Alpha-2-Globulin: 0.6 g/dL (ref 0.4–1.0)
Beta Globulin: 0.8 g/dL (ref 0.7–1.3)
Gamma Globulin: 0.5 g/dL (ref 0.4–1.8)
Globulin, Total: 2.1 g/dL — ABNORMAL LOW (ref 2.2–3.9)
Total Protein ELP: 5.2 g/dL — ABNORMAL LOW (ref 6.0–8.5)

## 2019-07-12 LAB — KAPPA/LAMBDA LIGHT CHAINS
Kappa free light chain: 370.1 mg/L — ABNORMAL HIGH (ref 3.3–19.4)
Kappa, lambda light chain ratio: 11.22 — ABNORMAL HIGH (ref 0.26–1.65)
Lambda free light chains: 33 mg/L — ABNORMAL HIGH (ref 5.7–26.3)

## 2019-07-12 MED ORDER — DEXTROSE 5 % IV SOLN
27.0000 mg/m2 | Freq: Once | INTRAVENOUS | Status: AC
  Administered 2019-07-12: 56 mg via INTRAVENOUS
  Filled 2019-07-12: qty 28

## 2019-07-12 MED ORDER — ONDANSETRON HCL 4 MG/2ML IJ SOLN
4.0000 mg | Freq: Once | INTRAMUSCULAR | Status: AC
  Administered 2019-07-12: 4 mg via INTRAVENOUS
  Filled 2019-07-12: qty 2

## 2019-07-12 MED ORDER — SODIUM CHLORIDE 0.9 % IV SOLN: Freq: Once | INTRAVENOUS | Status: AC

## 2019-07-12 MED ORDER — SODIUM CHLORIDE 0.9% FLUSH
10.0000 mL | INTRAVENOUS | Status: DC | PRN
  Administered 2019-07-12 (×2): 10 mL

## 2019-07-12 MED ORDER — HEPARIN SOD (PORK) LOCK FLUSH 100 UNIT/ML IV SOLN
500.0000 [IU] | Freq: Once | INTRAVENOUS | Status: AC | PRN
  Administered 2019-07-12: 500 [IU]

## 2019-07-12 NOTE — Progress Notes (Signed)
Treatment given today per MD orders. Tolerated infusion without adverse affects. Vital signs stable. No complaints at this time. Discharged from clinic ambulatory. F/U with  Cancer Center as scheduled.   

## 2019-07-12 NOTE — Patient Instructions (Signed)
[facility] Discharge Instructions for Patients Receiving Chemotherapy  Today you received the following chemotherapy agents   To help prevent nausea and vomiting after your treatment, we encourage you to take your nausea medication   If you develop nausea and vomiting that is not controlled by your nausea medication, call the clinic.   BELOW ARE SYMPTOMS THAT SHOULD BE REPORTED IMMEDIATELY:  *FEVER GREATER THAN 100.5 F  *CHILLS WITH OR WITHOUT FEVER  NAUSEA AND VOMITING THAT IS NOT CONTROLLED WITH YOUR NAUSEA MEDICATION  *UNUSUAL SHORTNESS OF BREATH  *UNUSUAL BRUISING OR BLEEDING  TENDERNESS IN MOUTH AND THROAT WITH OR WITHOUT PRESENCE OF ULCERS  *URINARY PROBLEMS  *BOWEL PROBLEMS  UNUSUAL RASH Items with * indicate a potential emergency and should be followed up as soon as possible.  Feel free to call the clinic should you have any questions or concerns. The clinic phone number is [phone].  Please show the CHEMO ALERT CARD at check-in to the Emergency Department and triage nurse.   

## 2019-07-14 ENCOUNTER — Other Ambulatory Visit (HOSPITAL_COMMUNITY): Payer: Self-pay | Admitting: *Deleted

## 2019-07-14 DIAGNOSIS — C9000 Multiple myeloma not having achieved remission: Secondary | ICD-10-CM

## 2019-07-14 MED ORDER — POMALIDOMIDE 3 MG PO CAPS: 3.0000 mg | ORAL_CAPSULE | Freq: Every day | ORAL | 0 refills | Status: DC

## 2019-07-21 ENCOUNTER — Ambulatory Visit (HOSPITAL_COMMUNITY): Payer: Medicare Other

## 2019-07-21 ENCOUNTER — Other Ambulatory Visit (HOSPITAL_COMMUNITY): Payer: Medicare Other

## 2019-07-21 ENCOUNTER — Ambulatory Visit (HOSPITAL_COMMUNITY): Payer: Medicare Other | Admitting: Hematology

## 2019-07-22 ENCOUNTER — Ambulatory Visit (HOSPITAL_COMMUNITY): Payer: Medicare Other

## 2019-07-25 ENCOUNTER — Inpatient Hospital Stay (HOSPITAL_COMMUNITY): Payer: Medicare Other

## 2019-07-25 ENCOUNTER — Other Ambulatory Visit: Payer: Self-pay

## 2019-07-25 ENCOUNTER — Encounter (HOSPITAL_COMMUNITY): Payer: Self-pay | Admitting: Hematology

## 2019-07-25 ENCOUNTER — Inpatient Hospital Stay (HOSPITAL_BASED_OUTPATIENT_CLINIC_OR_DEPARTMENT_OTHER): Payer: Medicare Other | Admitting: Hematology

## 2019-07-25 VITALS — BP 151/65 | HR 87 | Temp 98.3°F | Resp 18

## 2019-07-25 DIAGNOSIS — N185 Chronic kidney disease, stage 5: Secondary | ICD-10-CM

## 2019-07-25 DIAGNOSIS — C9000 Multiple myeloma not having achieved remission: Secondary | ICD-10-CM | POA: Diagnosis not present

## 2019-07-25 DIAGNOSIS — Z5112 Encounter for antineoplastic immunotherapy: Secondary | ICD-10-CM | POA: Diagnosis not present

## 2019-07-25 LAB — COMPREHENSIVE METABOLIC PANEL
ALT: 12 U/L (ref 0–44)
AST: 11 U/L — ABNORMAL LOW (ref 15–41)
Albumin: 3.5 g/dL (ref 3.5–5.0)
Alkaline Phosphatase: 59 U/L (ref 38–126)
Anion gap: 11 (ref 5–15)
BUN: 57 mg/dL — ABNORMAL HIGH (ref 6–20)
CO2: 21 mmol/L — ABNORMAL LOW (ref 22–32)
Calcium: 9.2 mg/dL (ref 8.9–10.3)
Chloride: 104 mmol/L (ref 98–111)
Creatinine, Ser: 5.59 mg/dL — ABNORMAL HIGH (ref 0.61–1.24)
GFR calc Af Amer: 12 mL/min — ABNORMAL LOW (ref >60)
GFR calc non Af Amer: 10 mL/min — ABNORMAL LOW (ref >60)
Glucose, Bld: 86 mg/dL (ref 70–99)
Potassium: 4.5 mmol/L (ref 3.5–5.1)
Sodium: 136 mmol/L (ref 135–145)
Total Bilirubin: 0.8 mg/dL (ref 0.3–1.2)
Total Protein: 6 g/dL — ABNORMAL LOW (ref 6.5–8.1)

## 2019-07-25 LAB — CBC WITH DIFFERENTIAL/PLATELET
Abs Immature Granulocytes: 0.01 10*3/uL (ref 0.00–0.07)
Basophils Absolute: 0.2 10*3/uL — ABNORMAL HIGH (ref 0.0–0.1)
Basophils Relative: 4 %
Eosinophils Absolute: 0.6 10*3/uL — ABNORMAL HIGH (ref 0.0–0.5)
Eosinophils Relative: 14 %
HCT: 28.2 % — ABNORMAL LOW (ref 39.0–52.0)
Hemoglobin: 9.3 g/dL — ABNORMAL LOW (ref 13.0–17.0)
Immature Granulocytes: 0 %
Lymphocytes Relative: 12 %
Lymphs Abs: 0.5 10*3/uL — ABNORMAL LOW (ref 0.7–4.0)
MCH: 38.3 pg — ABNORMAL HIGH (ref 26.0–34.0)
MCHC: 33 g/dL (ref 30.0–36.0)
MCV: 116 fL — ABNORMAL HIGH (ref 80.0–100.0)
Monocytes Absolute: 0.9 10*3/uL (ref 0.1–1.0)
Monocytes Relative: 21 %
Neutro Abs: 2.1 10*3/uL (ref 1.7–7.7)
Neutrophils Relative %: 49 %
Platelets: 247 10*3/uL (ref 150–400)
RBC: 2.43 MIL/uL — ABNORMAL LOW (ref 4.22–5.81)
RDW: 16.3 % — ABNORMAL HIGH (ref 11.5–15.5)
WBC: 4.4 10*3/uL (ref 4.0–10.5)
nRBC: 0 % (ref 0.0–0.2)

## 2019-07-25 LAB — MAGNESIUM: Magnesium: 2 mg/dL (ref 1.7–2.4)

## 2019-07-25 MED ORDER — SODIUM CHLORIDE 0.9 % IV SOLN: Freq: Once | INTRAVENOUS | Status: AC

## 2019-07-25 MED ORDER — DEXTROSE 5 % IV SOLN
27.0000 mg/m2 | Freq: Once | INTRAVENOUS | Status: AC
  Administered 2019-07-25: 56 mg via INTRAVENOUS
  Filled 2019-07-25: qty 28

## 2019-07-25 MED ORDER — SODIUM CHLORIDE 0.9% FLUSH
10.0000 mL | INTRAVENOUS | Status: DC | PRN
  Administered 2019-07-25: 10 mL

## 2019-07-25 MED ORDER — EPOETIN ALFA-EPBX 10000 UNIT/ML IJ SOLN
20000.0000 [IU] | Freq: Once | INTRAMUSCULAR | Status: AC
  Administered 2019-07-25: 20000 [IU] via SUBCUTANEOUS
  Filled 2019-07-25: qty 2

## 2019-07-25 MED ORDER — DENOSUMAB 120 MG/1.7ML ~~LOC~~ SOLN
120.0000 mg | Freq: Once | SUBCUTANEOUS | Status: AC
  Administered 2019-07-25: 120 mg via SUBCUTANEOUS
  Filled 2019-07-25: qty 1.7

## 2019-07-25 MED ORDER — ONDANSETRON HCL 4 MG/2ML IJ SOLN
4.0000 mg | Freq: Once | INTRAMUSCULAR | Status: AC
  Administered 2019-07-25: 4 mg via INTRAVENOUS
  Filled 2019-07-25: qty 2

## 2019-07-25 MED ORDER — HEPARIN SOD (PORK) LOCK FLUSH 100 UNIT/ML IV SOLN
500.0000 [IU] | Freq: Once | INTRAVENOUS | Status: AC | PRN
  Administered 2019-07-25: 500 [IU]

## 2019-07-25 MED ORDER — SODIUM CHLORIDE 0.9 % IV SOLN
40.0000 mg | Freq: Once | INTRAVENOUS | Status: AC
  Administered 2019-07-25: 40 mg via INTRAVENOUS
  Filled 2019-07-25: qty 4

## 2019-07-25 NOTE — Patient Instructions (Signed)
[facility] at [facility] Discharge Instructions  Labs drawn from portacath today   Thank you for choosing [facility] at [facility] to provide your oncology and hematology care.  To afford each patient quality time with our provider, please arrive at least 15 minutes before your scheduled appointment time.   If you have a lab appointment with the Cancer Center please come in thru the Main Entrance and check in at the main information desk.  You need to re-schedule your appointment should you arrive 10 or more minutes late.  We strive to give you quality time with our providers, and arriving late affects you and other patients whose appointments are after yours.  Also, if you no show three or more times for appointments you may be dismissed from the clinic at the providers discretion.     Again, thank you for choosing [facility].  Our hope is that these requests will decrease the amount of time that you wait before being seen by our physicians.       _____________________________________________________________  Should you have questions after your visit to  Cancer Center, please contact our office at [phone] between the hours of 8:00 a.m. and 4:30 p.m.  Voicemails left after 4:00 p.m. will not be returned until the following business day.  For prescription refill requests, have your pharmacy contact our office and allow 72 hours.    Due to Covid, you will need to wear a mask upon entering the hospital. If you do not have a mask, a mask will be given to you at the Main Entrance upon arrival. For doctor visits, patients may have 1 support person with them. For treatment visits, patients can not have anyone with them due to social distancing guidelines and our immunocompromised population.     

## 2019-07-25 NOTE — Progress Notes (Signed)
[place] reviewed with and pt seen by Dr. Delton Coombes and pt approved for Kyprolis infusion today as well as Delton See and Retacrit injections per MD                                                               [name] tolerated Kyprolis infusion and Xgeva and Retacrit injections well without complaints or incident. Hgb 9.3. Calcium 9.2 today and pt denied any tooth or jaw pain and no recent or future dental visits prior to administering Xgeva injection.VSS upon discharge. Pt discharged self ambulatory in satisfactory condition

## 2019-07-25 NOTE — Progress Notes (Signed)
[name], [place] [address]   CLINIC:  Medical Oncology/Hematology  PCP:  Brandon Fire, MD [place] [address] [phone]   REASON FOR VISIT: Follow-up for Multiple myeloma    BRIEF ONCOLOGIC HISTORY:  Oncology History  Kappa light chain myeloma ([place])  06/16/2018 Initial Diagnosis   Kappa light chain myeloma ([place])   06/17/2018 - 04/10/2019 Chemotherapy   The patient had palonosetron (ALOXI) injection 0.25 mg, 0.25 mg, Intravenous,  Once, 27 of 27 cycles Administration: 0.25 mg (06/17/2018), 0.25 mg (06/23/2018), 0.25 mg (06/30/2018), 0.25 mg (07/07/2018), 0.25 mg (07/14/2018), 0.25 mg (07/22/2018), 0.25 mg (07/29/2018), 0.25 mg (08/06/2018), 0.25 mg (08/13/2018), 0.25 mg (08/20/2018), 0.25 mg (08/27/2018), 0.25 mg (09/06/2018), 0.25 mg (09/13/2018), 0.25 mg (09/20/2018), 0.25 mg (09/27/2018), 0.25 mg (10/04/2018), 0.25 mg (10/11/2018), 0.25 mg (10/18/2018), 0.25 mg (10/25/2018), 0.25 mg (11/01/2018), 0.25 mg (11/08/2018), 0.25 mg (11/15/2018), 0.25 mg (11/22/2018), 0.25 mg (11/29/2018), 0.25 mg (12/08/2018), 0.25 mg (12/16/2018) bortezomib SQ (VELCADE) chemo injection 2.5 mg, 1.3 mg/m2 = 2.5 mg, Subcutaneous,  Once, 41 of 42 cycles Administration: 2.5 mg (06/17/2018), 2.5 mg (06/23/2018), 2.5 mg (06/30/2018), 2.5 mg (07/07/2018), 2.5 mg (07/14/2018), 2.5 mg (07/22/2018), 2.5 mg (07/29/2018), 2.5 mg (08/06/2018), 2.5 mg (08/13/2018), 2.5 mg (08/20/2018), 2.5 mg (08/27/2018), 2.5 mg (09/06/2018), 2.5 mg (09/13/2018), 2.5 mg (09/20/2018), 2.5 mg (09/27/2018), 2.5 mg (10/04/2018), 2.5 mg (10/11/2018), 2.5 mg (10/18/2018), 2.5 mg (10/25/2018), 2.5 mg (11/01/2018), 2.5 mg (11/08/2018), 2.5 mg (11/15/2018), 2.5 mg (11/22/2018), 2.5 mg (11/29/2018), 2.5 mg (12/08/2018), 2.5 mg (12/16/2018), 2.5 mg (12/27/2018), 2.5 mg (01/03/2019), 2.5 mg (01/10/2019), 2.5 mg (01/17/2019), 2.5 mg (01/24/2019), 2.5 mg (01/31/2019), 2.5 mg (02/07/2019), 2.5 mg (02/14/2019), 2.5 mg (02/21/2019), 2.5 mg (02/28/2019), 2.5  mg (03/07/2019), 2.5 mg (03/14/2019), 2.5 mg (03/21/2019), 2.5 mg (03/29/2019), 2.5 mg (04/04/2019) cyclophosphamide (CYTOXAN) 300 mg in sodium chloride 0.9 % 250 mL chemo infusion, 300 mg (100 % of original dose 300 mg), Intravenous,  Once, 26 of 26 cycles Dose modification: 300 mg (original dose 300 mg, Cycle 1, Reason: Change in SCr/CrCl), 500 mg (original dose 500 mg, Cycle 2, Reason: Provider Judgment), 500 mg (original dose 500 mg, Cycle 3, Reason: Change in SCr/CrCl) Administration: 300 mg (06/17/2018), 500 mg (06/23/2018), 500 mg (06/30/2018), 600 mg (07/07/2018), 600 mg (07/14/2018), 600 mg (07/22/2018), 600 mg (07/29/2018), 600 mg (08/06/2018), 600 mg (08/13/2018), 600 mg (08/20/2018), 600 mg (08/27/2018), 600 mg (09/06/2018), 600 mg (09/13/2018), 600 mg (09/20/2018), 600 mg (09/27/2018), 600 mg (10/04/2018), 600 mg (10/11/2018), 600 mg (10/18/2018), 600 mg (10/25/2018), 600 mg (11/01/2018), 600 mg (11/08/2018), 600 mg (11/15/2018), 600 mg (11/22/2018), 600 mg (11/29/2018), 600 mg (12/08/2018), 600 mg (12/16/2018)  for chemotherapy treatment.    04/21/2019 -  Chemotherapy   The patient had ondansetron (ZOFRAN) injection 4 mg, 4 mg (100 % of original dose 4 mg), Intravenous,  Once, 3 of 5 cycles Dose modification: 8 mg (original dose 4 mg, Cycle 2), 4 mg (original dose 4 mg, Cycle 2), 4 mg (original dose 4 mg, Cycle 2) Administration: 4 mg (06/09/2019), 4 mg (06/10/2019), 4 mg (06/23/2019), 4 mg (06/24/2019), 4 mg (07/04/2019), 4 mg (07/05/2019), 4 mg (07/11/2019), 4 mg (07/12/2019), 4 mg (07/25/2019) carfilzomib (KYPROLIS) 40 mg in dextrose 5 % 50 mL chemo infusion, 20 mg/m2 = 40 mg, Intravenous,  Once, 4 of 6 cycles Dose modification: 27 mg/m2 (original dose 27 mg/m2, Cycle 1, Reason: Provider Judgment) Administration: 40 mg (04/21/2019), 40 mg (04/22/2019), 60 mg (04/28/2019), 60 mg (  04/29/2019), 60 mg (05/05/2019), 60 mg (05/06/2019), 56 mg (05/26/2019), 56 mg (05/27/2019), 56 mg (06/02/2019), 56 mg (06/03/2019), 56 mg (06/09/2019), 56 mg (06/10/2019),  56 mg (06/23/2019), 56 mg (06/24/2019), 56 mg (07/04/2019), 56 mg (07/05/2019), 56 mg (07/11/2019), 56 mg (07/12/2019), 56 mg (07/25/2019)  for chemotherapy treatment.       INTERVAL HISTORY:  [name] 59 y.o. male seen for follow-up of multiple myeloma nontoxic [name] prior to cycle 4 of carfilzomib.  Appetite is not percent.  Energy levels are 50%.  Reports cramping in the back of the legs occasionally.  Denies any tingling or numbness extremities.  Reports tolerating Pomalyst very well.  He started back Pomalyst on 07/19/2019.  REVIEW OF SYSTEMS:  Review of Systems  All other systems reviewed and are negative.    PAST MEDICAL/SURGICAL HISTORY:  Past Medical History:  Diagnosis Date  . Allergy   . Arthritis    neck and back  . Blood transfusion without reported diagnosis   . BPH (benign prostatic hyperplasia)   . Cancer [facility]) 2004   testicle  . Chronic kidney disease    kidney stones  . Left lumbar radiculopathy 06/12/2016  . 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 Medical history non-contributory    Pt has scattered thoughts and uncertain of past medical history  . 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 Stones, urinary tract   . Substance abuse ([place])    prescribed oxydcodone- 10 years   Past Surgical History:  Procedure Laterality Date  . BACK SURGERY     x5  . CERVICAL DISC SURGERY     x2  . COLONOSCOPY WITH PROPOFOL N/A 08/11/2016   Dr. Gala Rowland: non-bleeding internal hemorrhoids, one 4 mm hyperplastic rectal polyp, diverticulosis in entire examined colon  . POLYPECTOMY  08/11/2016   Procedure: POLYPECTOMY;  Surgeon: Brandon Dolin, MD;  Location: [facility] ENDO SUITE;  Service: Endoscopy;;  colon  . PORTACATH PLACEMENT Left 09/20/2018   Procedure: INSERTION PORT-A-CATH (catheter attached left subclavian);  Surgeon: Brandon Cagey, MD;  Location: [facility] ORS;  Service: General;  Laterality: Left;  . testicular cancer  2004  . TONSILLECTOMY       SOCIAL HISTORY:  Social History    Socioeconomic History  . Marital status: Single    Spouse name: Not on file  . Number of children: 1  . Years of education: 19  . Highest education level: Not on file  Occupational History  . Occupation: retired    Comment: Psychologist, prison and probation services  . Occupation: disabled  Tobacco Use  . Smoking status: Current Every Day Smoker    Packs/day: 1.00    Years: 40.00    Pack years: 40.00    Types: Cigarettes    Start date: 03/31/1974  . Smokeless tobacco: Never Used  Substance and Sexual Activity  . Alcohol use: Yes    Comment: Occasional  . Drug use: No    Types: Cocaine    Comment: Remote hx of cocaine, quit 1989  . Sexual activity: Yes  Other Topics Concern  . Not on file  Social History Narrative   [employer] for 12 years   [employer] for 33 years      Never married   One daughter      Lives alone   Retail banker   Right-handed   Occasional caffeine use         Social Determinants of Health   Financial Resource Strain:   . Difficulty of Paying Living Expenses:  Food Insecurity:   . Worried About Charity fundraiser in the Last Year:   . Arboriculturist in the Last Year:   Transportation Needs:   . Film/video editor (Medical):   Marland Kitchen Lack of Transportation (Non-Medical):   Physical Activity:   . Days of Exercise per Week:   . Minutes of Exercise per Session:   Stress:   . Feeling of Stress :   Social Connections:   . Frequency of Communication with Friends and Family:   . Frequency of Social Gatherings with Friends and Family:   . Attends Religious Services:   . Active Member of Clubs or Organizations:   . Attends Archivist Meetings:   Marland Kitchen Marital Status:   Intimate Partner Violence: Not At Risk  . Fear of Current or Ex-Partner: No  . Emotionally Abused: No  . Physically Abused: No  . Sexually Abused: No    FAMILY HISTORY:  Family History  Problem Relation Age of Onset  . COPD Mother   . Heart disease Mother   . Other Father         Never knew his father.  . Thyroid disease Sister   . Arthritis Sister   . Heart disease Sister        bypass  . Colon cancer Neg Hx     CURRENT MEDICATIONS:  Outpatient Encounter Medications as of 07/25/2019  Medication Sig  . aspirin EC 81 MG tablet Take 81 mg by mouth daily.  . bortezomib IV (VELCADE) 3.5 MG injection Inject 1.3 mg/m2 into the vein once a week.   . calcitRIOL (ROCALTROL) 0.5 MCG capsule Take 0.5 mcg by mouth daily.   Marland Kitchen CARFILZOMIB IV Inject into the vein.  . CYCLOPHOSPHAMIDE IV Inject into the vein once a week.  Marland Kitchen Epoetin Alfa (PROCRIT IJ) Inject as directed. Unsure of dosage- gets this once weekly  . Patiromer Sorbitex Calcium (VELTASSA PO) Take by mouth 2 (two) times a week.   . pomalidomide (POMALYST) 3 MG capsule Take 1 capsule (3 mg total) by mouth daily.  Marland Kitchen senna (SENOKOT) 8.6 MG tablet Take 1 tablet by mouth 2 (two) times daily.   . sodium bicarbonate 650 MG tablet Take 1,300 mg by mouth 2 (two) times daily.  . valACYclovir (VALTREX) 500 MG tablet TAKE 1 TABLET BY MOUTH TWICE A DAY  . HYDROmorphone (DILAUDID) 4 MG tablet Take 1 tablet (4 mg total) by mouth every 4 (four) hours as needed for severe pain. (Patient not taking: Reported on 07/25/2019)  . Misc. Devices MISC Please provide patient with rollaider.  Dx: multiple myeloma C90.0 (Patient not taking: Reported on 06/09/2019)  . prochlorperazine (COMPAZINE) 5 MG tablet Take 1 tablet (5 mg total) by mouth every 6 (six) hours as needed for nausea or vomiting. (Patient not taking: Reported on 05/26/2019)  . torsemide (DEMADEX) 20 MG tablet TAKING 1 TABLET DAILY, AND 2 TABLETS ONLY IF HIS ANKLES ARE REALLY SWOLLEN (Patient not taking: Reported on 05/26/2019)   No facility-administered encounter medications on file as of 07/25/2019.    ALLERGIES:  Allergies  Allergen Reactions  . Oxycodone-Acetaminophen   . Acetaminophen Nausea Only and Other (See Comments)    Elevated liver enzymes  . Cymbalta [Duloxetine  Hcl] Swelling    Facial swelling  . Diclofenac Sodium Swelling       . Diclofenac Sodium Swelling  . Imodium [Loperamide] Swelling    facial  . Lyrica [Pregabalin] Swelling  . Morphine Other (See Comments)  Bradycardia   . Vioxx [Rofecoxib] Swelling  . Aleve [Naproxen] Swelling    eye     PHYSICAL EXAM:  ECOG Performance status: 1  Vitals:   07/25/19 0950  BP: (!) 150/54  Pulse: 77  Resp: 18  Temp: (!) 97.5 F (36.4 C)  SpO2: 100%   Filed Weights   07/25/19 0950  Weight: 167 lb 9.6 oz (76 kg)    Physical Exam Vitals reviewed.  Constitutional:      Appearance: Normal appearance. He is normal weight.  Cardiovascular:     Rate and Rhythm: Normal rate and regular rhythm.     Heart sounds: Normal heart sounds.  Pulmonary:     Effort: Pulmonary effort is normal.     Breath sounds: Normal breath sounds.  Abdominal:     General: Bowel sounds are normal.     Palpations: Abdomen is soft.  Musculoskeletal:        General: Normal range of motion.     Right lower leg: No edema.     Left lower leg: No edema.  Skin:    General: Skin is warm and dry.  Neurological:     Mental Status: He is alert and oriented to person, place, and time. Mental status is at baseline.  Psychiatric:        Mood and Affect: Mood normal.        Behavior: Behavior normal.      LABORATORY DATA:  I have reviewed the labs as listed.  CBC    Component Value Date/Time   WBC 4.4 07/25/2019 0946   RBC 2.43 (L) 07/25/2019 0946   HGB 9.3 (L) 07/25/2019 0946   HCT 28.2 (L) 07/25/2019 0946   PLT 247 07/25/2019 0946   MCV 116.0 (H) 07/25/2019 0946   MCH 38.3 (H) 07/25/2019 0946   MCHC 33.0 07/25/2019 0946   RDW 16.3 (H) 07/25/2019 0946   LYMPHSABS 0.5 (L) 07/25/2019 0946   MONOABS 0.9 07/25/2019 0946   EOSABS 0.6 (H) 07/25/2019 0946   BASOSABS 0.2 (H) 07/25/2019 0946   CMP Latest Ref Rng & Units 07/25/2019 07/11/2019 07/04/2019  Glucose 70 - 99 mg/dL 86 80 99  BUN 6 - 20 mg/dL 57(H)  63(H) 60(H)  Creatinine 0.61 - 1.24 mg/dL 5.59(H) 5.76(H) 5.61(H)  Sodium 135 - 145 mmol/L 136 136 138  Potassium 3.5 - 5.1 mmol/L 4.5 4.6 4.8  Chloride 98 - 111 mmol/L 104 104 107  CO2 22 - 32 mmol/L 21(L) 19(L) 22  Calcium 8.9 - 10.3 mg/dL 9.2 8.9 9.1  Total Protein 6.5 - 8.1 g/dL 6.0(L) 5.7(L) 5.8(L)  Total Bilirubin 0.3 - 1.2 mg/dL 0.8 0.9 0.7  Alkaline Phos 38 - 126 U/L 59 53 50  AST 15 - 41 U/L 11(L) 13(L) 15  ALT 0 - 44 U/L 12 16 16       I reviewed the scans.     ASSESSMENT & PLAN:   Kappa light chain myeloma ([place]) 1.  Kappa light chain myeloma, high risk, stage III: -CyBorD from 06/17/2018 through 4 04/04/2019 with progression. -Carfilzomib, pomalidomide and dexamethasone cycle 1 on 04/21/2019, cycle 2 on 05/26/2019, cycle 3 on 06/23/2019. -He started taking Pomalyst on 07/19/2019.  He is tolerating well. -We discussed myeloma panel from 07/11/2019.  M spike is negative.  Kappa light chains improved to 370.  Ratio improved 11.2. -I reviewed his CBC today.  He will proceed with cycle 4-day 1 today.  I will see him back in 4 weeks for follow-up.  2.  Back pain: -MRI of the lumbar and thoracic spine on 04/28/2019 showed ventral epidural tumor posterior to T12 body with mild spinal stenosis. -XRT to the back from 05/22/2019 through 05/27/2019. -He will continue Dilaudid 4 mg every 4 hours which is helping.  3.  Anemia: -This is from CKD and myeloma. -Hemoglobin today is 9.3.  We will continue Retacrit 20,000 units as needed.  4.  Myeloma bone disease: -I have recommended restarting denosumab.  We discussed the side effects.  He does not have any dental issues.  5.  ID/thromboprophylaxis: -She will continue Valtrex twice daily.  Aspirin 81 mg daily.      Orders placed this encounter:  No orders of the defined types were placed in this encounter.    Derek Jack, MD [place] [phone]

## 2019-07-25 NOTE — Patient Instructions (Signed)
[facility] Discharge Instructions for Patients Receiving Chemotherapy   Beginning January 23rd 2017 lab work for the [facility] will be done in the  Main lab at [facility] on 1st floor. If you have a lab appointment with the [facility] please come in thru the  Main Entrance and check in at the main information desk   Today you received the following chemotherapy agents Kyprolis as well as Xgeva and Retacrit injections. Follow-up as scheduled. Call clinic for any questions or concerns  To help prevent nausea and vomiting after your treatment, we encourage you to take your nausea medication   If you develop nausea and vomiting, or diarrhea that is not controlled by your medication, call the clinic.  The clinic phone number is [phone]. Office hours are Monday-Friday 8:30am-5:00pm.  BELOW ARE SYMPTOMS THAT SHOULD BE REPORTED IMMEDIATELY:  *FEVER GREATER THAN 101.0 F  *CHILLS WITH OR WITHOUT FEVER  NAUSEA AND VOMITING THAT IS NOT CONTROLLED WITH YOUR NAUSEA MEDICATION  *UNUSUAL SHORTNESS OF BREATH  *UNUSUAL BRUISING OR BLEEDING  TENDERNESS IN MOUTH AND THROAT WITH OR WITHOUT PRESENCE OF ULCERS  *URINARY PROBLEMS  *BOWEL PROBLEMS  UNUSUAL RASH Items with * indicate a potential emergency and should be followed up as soon as possible. If you have an emergency after office hours please contact your primary care physician or go to the nearest emergency department.  Please call the clinic during office hours if you have any questions or concerns.   You may also contact the Patient Navigator at [phone] should you have any questions or need assistance in obtaining follow up care.      Resources For Cancer Patients and their Caregivers ? American Cancer Society: Can assist with transportation, wigs, general needs, runs Look Good Feel Better.        (820)572-6245 ? Cancer Care: Provides financial assistance, online support groups,  medication/co-pay assistance.  1-800-813-HOPE (302) 862-0008) ? [facility] Assists [place] cancer patients and their families through emotional , educational and financial support.  905 591 6210 ? [place] DSS Where to apply for food stamps, Medicaid and utility assistance. (301)116-2213 ? [facility]: Transportation to medical appointments. 306-104-2337 ? Social Security Administration: May apply for disability if have a Stage IV cancer. ([phone] 254-373-5544 ? [employer], Disability and Transit Services: Assists with nutrition, care and transit needs. [phone]

## 2019-07-25 NOTE — Progress Notes (Signed)
Patient has been assessed, vital signs and labs have been reviewed by Dr. Delton Coombes. ANC, Creatinine, LFTs, and Platelets are within treatment parameters per Dr. Delton Coombes. The patient is good to proceed with treatment at this time. Please give Delton See today if possible per Dr. Delton Coombes.

## 2019-07-25 NOTE — Patient Instructions (Addendum)
[facility] at [facility] Discharge Instructions  You were seen today by Dr. Katragadda. He went over your recent lab results. He will see you back in 4 weeks for labs, treatment and follow up.   Thank you for choosing [facility] at [facility] to provide your oncology and hematology care.  To afford each patient quality time with our provider, please arrive at least 15 minutes before your scheduled appointment time.   If you have a lab appointment with the Cancer Center please come in thru the  Main Entrance and check in at the main information desk  You need to re-schedule your appointment should you arrive 10 or more minutes late.  We strive to give you quality time with our providers, and arriving late affects you and other patients whose appointments are after yours.  Also, if you no show three or more times for appointments you may be dismissed from the clinic at the providers discretion.     Again, thank you for choosing [facility].  Our hope is that these requests will decrease the amount of time that you wait before being seen by our physicians.       _____________________________________________________________  Should you have questions after your visit to [facility], please contact our office at [phone] between the hours of 8:00 a.m. and 4:30 p.m.  Voicemails left after 4:00 p.m. will not be returned until the following business day.  For prescription refill requests, have your pharmacy contact our office and allow 72 hours.    Cancer Center Support Programs:   > Cancer Support Group  2nd Tuesday of the month 1pm-2pm, Journey Room    

## 2019-07-25 NOTE — Assessment & Plan Note (Signed)
1.  Kappa light chain myeloma, high risk, stage III: -CyBorD from 06/17/2018 through 4 04/04/2019 with progression. -Carfilzomib, pomalidomide and dexamethasone cycle 1 on 04/21/2019, cycle 2 on 05/26/2019, cycle 3 on 06/23/2019. -He started taking Pomalyst on 07/19/2019.  He is tolerating well. -We discussed myeloma panel from 07/11/2019.  M spike is negative.  Kappa light chains improved to 370.  Ratio improved 11.2. -I reviewed his CBC today.  He will proceed with cycle 4-day 1 today.  I will see him back in 4 weeks for follow-up.  2.  Back pain: -MRI of the lumbar and thoracic spine on 04/28/2019 showed ventral epidural tumor posterior to T12 body with mild spinal stenosis. -XRT to the back from 05/22/2019 through 05/27/2019. -He will continue Dilaudid 4 mg every 4 hours which is helping.  3.  Anemia: -This is from CKD and myeloma. -Hemoglobin today is 9.3.  We will continue Retacrit 20,000 units as needed.  4.  Myeloma bone disease: -I have recommended restarting denosumab.  We discussed the side effects.  He does not have any dental issues.  5.  ID/thromboprophylaxis: -She will continue Valtrex twice daily.  Aspirin 81 mg daily.

## 2019-07-26 ENCOUNTER — Encounter (HOSPITAL_COMMUNITY): Payer: Self-pay

## 2019-07-26 ENCOUNTER — Inpatient Hospital Stay (HOSPITAL_COMMUNITY): Payer: Medicare Other

## 2019-07-26 VITALS — BP 157/76 | HR 60 | Temp 97.7°F | Resp 18

## 2019-07-26 DIAGNOSIS — C9000 Multiple myeloma not having achieved remission: Secondary | ICD-10-CM

## 2019-07-26 DIAGNOSIS — Z5112 Encounter for antineoplastic immunotherapy: Secondary | ICD-10-CM | POA: Diagnosis not present

## 2019-07-26 MED ORDER — SODIUM CHLORIDE 0.9 % IV SOLN: Freq: Once | INTRAVENOUS | Status: AC

## 2019-07-26 MED ORDER — HEPARIN SOD (PORK) LOCK FLUSH 100 UNIT/ML IV SOLN
500.0000 [IU] | Freq: Once | INTRAVENOUS | Status: AC | PRN
  Administered 2019-07-26: 500 [IU]

## 2019-07-26 MED ORDER — DEXTROSE 5 % IV SOLN
27.0000 mg/m2 | Freq: Once | INTRAVENOUS | Status: AC
  Administered 2019-07-26: 56 mg via INTRAVENOUS
  Filled 2019-07-26: qty 28

## 2019-07-26 MED ORDER — SODIUM CHLORIDE 0.9% FLUSH
10.0000 mL | INTRAVENOUS | Status: DC | PRN
  Administered 2019-07-26: 10 mL

## 2019-07-26 MED ORDER — ONDANSETRON HCL 4 MG/2ML IJ SOLN
4.0000 mg | Freq: Once | INTRAMUSCULAR | Status: AC
  Administered 2019-07-26: 4 mg via INTRAVENOUS
  Filled 2019-07-26: qty 2

## 2019-07-26 NOTE — Patient Instructions (Addendum)
[facility] Discharge Instructions for Patients Receiving Chemotherapy   Beginning January 23rd 2017 lab work for the Cancer Center will be done in the  Main lab at  on 1st floor. If you have a lab appointment with the Cancer Center please come in thru the  Main Entrance and check in at the main information desk   Today you received the following chemotherapy agents Kyprolis. Follow-up as scheduled. Call clinic for any questions or concerns  To help prevent nausea and vomiting after your treatment, we encourage you to take your nausea medication   If you develop nausea and vomiting, or diarrhea that is not controlled by your medication, call the clinic.  The clinic phone number is [phone]. Office hours are Monday-Friday 8:30am-5:00pm.  BELOW ARE SYMPTOMS THAT SHOULD BE REPORTED IMMEDIATELY:  *FEVER GREATER THAN 101.0 F  *CHILLS WITH OR WITHOUT FEVER  NAUSEA AND VOMITING THAT IS NOT CONTROLLED WITH YOUR NAUSEA MEDICATION  *UNUSUAL SHORTNESS OF BREATH  *UNUSUAL BRUISING OR BLEEDING  TENDERNESS IN MOUTH AND THROAT WITH OR WITHOUT PRESENCE OF ULCERS  *URINARY PROBLEMS  *BOWEL PROBLEMS  UNUSUAL RASH Items with * indicate a potential emergency and should be followed up as soon as possible. If you have an emergency after office hours please contact your primary care physician or go to the nearest emergency department.  Please call the clinic during office hours if you have any questions or concerns.   You may also contact the Patient Navigator at [phone] should you have any questions or need assistance in obtaining follow up care.      Resources For Cancer Patients and their Caregivers ? American Cancer Society: Can assist with transportation, wigs, general needs, runs Look Good Feel Better.        1-888-227-6333 ? Cancer Care: Provides financial assistance, online support groups, medication/co-pay assistance.  1-800-813-HOPE  (4673) ? [facility] Assists [place] cancer patients and their families through emotional , educational and financial support.  336-427-4357 ? [place] DSS Where to apply for food stamps, Medicaid and utility assistance. 336-342-1394 ? [facility]: Transportation to medical appointments. 336-347-2287 ? Social Security Administration: May apply for disability if have a Stage IV cancer. 336-342-7796 1-800-772-1213 ? [place] [facility]ging, Disability and Transit Services: Assists with nutrition, care and transit needs. 336-349-2343         

## 2019-07-26 NOTE — Progress Notes (Signed)
[name] tolerated [name] infusion well without complaints or incident. VSS upon discharge. Pt discharged self ambulatory in satisfactory condition

## 2019-07-28 ENCOUNTER — Other Ambulatory Visit (HOSPITAL_COMMUNITY): Payer: Medicare Other

## 2019-07-28 ENCOUNTER — Ambulatory Visit (HOSPITAL_COMMUNITY): Payer: Medicare Other

## 2019-07-29 ENCOUNTER — Ambulatory Visit (HOSPITAL_COMMUNITY): Payer: Medicare Other

## 2019-08-01 ENCOUNTER — Encounter (HOSPITAL_COMMUNITY): Payer: Self-pay

## 2019-08-01 ENCOUNTER — Inpatient Hospital Stay (HOSPITAL_COMMUNITY): Payer: Medicare Other

## 2019-08-01 ENCOUNTER — Other Ambulatory Visit: Payer: Self-pay

## 2019-08-01 ENCOUNTER — Inpatient Hospital Stay (HOSPITAL_COMMUNITY): Payer: Medicare Other | Attending: Hematology

## 2019-08-01 VITALS — BP 140/80 | HR 50 | Temp 97.9°F | Resp 18

## 2019-08-01 DIAGNOSIS — N185 Chronic kidney disease, stage 5: Secondary | ICD-10-CM

## 2019-08-01 DIAGNOSIS — N189 Chronic kidney disease, unspecified: Secondary | ICD-10-CM | POA: Insufficient documentation

## 2019-08-01 DIAGNOSIS — D631 Anemia in chronic kidney disease: Secondary | ICD-10-CM | POA: Diagnosis not present

## 2019-08-01 DIAGNOSIS — C9000 Multiple myeloma not having achieved remission: Secondary | ICD-10-CM

## 2019-08-01 DIAGNOSIS — Z5112 Encounter for antineoplastic immunotherapy: Secondary | ICD-10-CM | POA: Insufficient documentation

## 2019-08-01 LAB — CBC WITH DIFFERENTIAL/PLATELET
Abs Immature Granulocytes: 0.03 10*3/uL (ref 0.00–0.07)
Basophils Absolute: 0.1 10*3/uL (ref 0.0–0.1)
Basophils Relative: 1 %
Eosinophils Absolute: 0.8 10*3/uL — ABNORMAL HIGH (ref 0.0–0.5)
Eosinophils Relative: 15 %
HCT: 27.4 % — ABNORMAL LOW (ref 39.0–52.0)
Hemoglobin: 8.9 g/dL — ABNORMAL LOW (ref 13.0–17.0)
Immature Granulocytes: 1 %
Lymphocytes Relative: 10 %
Lymphs Abs: 0.5 10*3/uL — ABNORMAL LOW (ref 0.7–4.0)
MCH: 38.2 pg — ABNORMAL HIGH (ref 26.0–34.0)
MCHC: 32.5 g/dL (ref 30.0–36.0)
MCV: 117.6 fL — ABNORMAL HIGH (ref 80.0–100.0)
Monocytes Absolute: 1 10*3/uL (ref 0.1–1.0)
Monocytes Relative: 19 %
Neutro Abs: 2.8 10*3/uL (ref 1.7–7.7)
Neutrophils Relative %: 54 %
Platelets: 171 10*3/uL (ref 150–400)
RBC: 2.33 MIL/uL — ABNORMAL LOW (ref 4.22–5.81)
RDW: 16 % — ABNORMAL HIGH (ref 11.5–15.5)
WBC: 5.3 10*3/uL (ref 4.0–10.5)
nRBC: 0 % (ref 0.0–0.2)

## 2019-08-01 LAB — COMPREHENSIVE METABOLIC PANEL
ALT: 14 U/L (ref 0–44)
AST: 12 U/L — ABNORMAL LOW (ref 15–41)
Albumin: 3.4 g/dL — ABNORMAL LOW (ref 3.5–5.0)
Alkaline Phosphatase: 54 U/L (ref 38–126)
Anion gap: 9 (ref 5–15)
BUN: 53 mg/dL — ABNORMAL HIGH (ref 6–20)
CO2: 20 mmol/L — ABNORMAL LOW (ref 22–32)
Calcium: 7.8 mg/dL — ABNORMAL LOW (ref 8.9–10.3)
Chloride: 108 mmol/L (ref 98–111)
Creatinine, Ser: 5.29 mg/dL — ABNORMAL HIGH (ref 0.61–1.24)
GFR calc Af Amer: 13 mL/min — ABNORMAL LOW (ref >60)
GFR calc non Af Amer: 11 mL/min — ABNORMAL LOW (ref >60)
Glucose, Bld: 81 mg/dL (ref 70–99)
Potassium: 4.8 mmol/L (ref 3.5–5.1)
Sodium: 137 mmol/L (ref 135–145)
Total Bilirubin: 0.7 mg/dL (ref 0.3–1.2)
Total Protein: 5.6 g/dL — ABNORMAL LOW (ref 6.5–8.1)

## 2019-08-01 LAB — MAGNESIUM: Magnesium: 2 mg/dL (ref 1.7–2.4)

## 2019-08-01 MED ORDER — ONDANSETRON HCL 4 MG/2ML IJ SOLN
4.0000 mg | Freq: Once | INTRAMUSCULAR | Status: AC
  Administered 2019-08-01: 11:00:00 4 mg via INTRAVENOUS
  Filled 2019-08-01: qty 2

## 2019-08-01 MED ORDER — SODIUM CHLORIDE 0.9% FLUSH
10.0000 mL | INTRAVENOUS | Status: DC | PRN
  Administered 2019-08-01: 10 mL

## 2019-08-01 MED ORDER — SODIUM CHLORIDE 0.9 % IV SOLN: Freq: Once | INTRAVENOUS | Status: AC

## 2019-08-01 MED ORDER — OCTREOTIDE ACETATE 30 MG IM KIT
PACK | INTRAMUSCULAR | Status: AC
  Filled 2019-08-01: qty 1

## 2019-08-01 MED ORDER — DEXTROSE 5 % IV SOLN
27.0000 mg/m2 | Freq: Once | INTRAVENOUS | Status: AC
  Administered 2019-08-01: 56 mg via INTRAVENOUS
  Filled 2019-08-01: qty 28

## 2019-08-01 MED ORDER — SODIUM CHLORIDE 0.9 % IV SOLN
40.0000 mg | Freq: Once | INTRAVENOUS | Status: AC
  Administered 2019-08-01: 40 mg via INTRAVENOUS
  Filled 2019-08-01: qty 4

## 2019-08-01 MED ORDER — HEPARIN SOD (PORK) LOCK FLUSH 100 UNIT/ML IV SOLN
500.0000 [IU] | Freq: Once | INTRAVENOUS | Status: AC | PRN
  Administered 2019-08-01: 12:00:00 500 [IU]

## 2019-08-01 MED ORDER — EPOETIN ALFA-EPBX 10000 UNIT/ML IJ SOLN
20000.0000 [IU] | Freq: Once | INTRAMUSCULAR | Status: AC
  Administered 2019-08-01: 20000 [IU] via SUBCUTANEOUS
  Filled 2019-08-01: qty 2

## 2019-08-01 NOTE — Progress Notes (Signed)
Labs meet parameters for treatment today . No new issues reported by patient at this time.     Treatment given per orders. Patient tolerated it well without problems. Vitals stable and discharged home from clinic ambulatory. Follow up as scheduled.

## 2019-08-01 NOTE — Progress Notes (Signed)
08/01/19  Ok to treat with CMET from today.  T.O. Dr Rhys Martini, PharmD

## 2019-08-01 NOTE — Patient Instructions (Signed)
[facility] Discharge Instructions for Patients Receiving Chemotherapy  Today you received the following chemotherapy agents   To help prevent nausea and vomiting after your treatment, we encourage you to take your nausea medication   If you develop nausea and vomiting that is not controlled by your nausea medication, call the clinic.   BELOW ARE SYMPTOMS THAT SHOULD BE REPORTED IMMEDIATELY:  *FEVER GREATER THAN 100.5 F  *CHILLS WITH OR WITHOUT FEVER  NAUSEA AND VOMITING THAT IS NOT CONTROLLED WITH YOUR NAUSEA MEDICATION  *UNUSUAL SHORTNESS OF BREATH  *UNUSUAL BRUISING OR BLEEDING  TENDERNESS IN MOUTH AND THROAT WITH OR WITHOUT PRESENCE OF ULCERS  *URINARY PROBLEMS  *BOWEL PROBLEMS  UNUSUAL RASH Items with * indicate a potential emergency and should be followed up as soon as possible.  Feel free to call the clinic should you have any questions or concerns. The clinic phone number is [phone].  Please show the CHEMO ALERT CARD at check-in to the Emergency Department and triage nurse.   

## 2019-08-02 ENCOUNTER — Inpatient Hospital Stay (HOSPITAL_COMMUNITY): Payer: Medicare Other

## 2019-08-02 VITALS — BP 139/69 | HR 77 | Temp 96.9°F | Resp 18

## 2019-08-02 DIAGNOSIS — Z5112 Encounter for antineoplastic immunotherapy: Secondary | ICD-10-CM | POA: Diagnosis not present

## 2019-08-02 DIAGNOSIS — C9000 Multiple myeloma not having achieved remission: Secondary | ICD-10-CM

## 2019-08-02 MED ORDER — DEXTROSE 5 % IV SOLN
27.0000 mg/m2 | Freq: Once | INTRAVENOUS | Status: AC
  Administered 2019-08-02: 56 mg via INTRAVENOUS
  Filled 2019-08-02: qty 28

## 2019-08-02 MED ORDER — HEPARIN SOD (PORK) LOCK FLUSH 100 UNIT/ML IV SOLN
500.0000 [IU] | Freq: Once | INTRAVENOUS | Status: AC | PRN
  Administered 2019-08-02: 500 [IU]

## 2019-08-02 MED ORDER — ONDANSETRON HCL 4 MG/2ML IJ SOLN
4.0000 mg | Freq: Once | INTRAMUSCULAR | Status: AC
  Administered 2019-08-02: 4 mg via INTRAVENOUS
  Filled 2019-08-02: qty 2

## 2019-08-02 MED ORDER — SODIUM CHLORIDE 0.9 % IV SOLN: Freq: Once | INTRAVENOUS | Status: AC

## 2019-08-02 MED ORDER — SODIUM CHLORIDE 0.9% FLUSH
10.0000 mL | INTRAVENOUS | Status: DC | PRN
  Administered 2019-08-02: 10 mL

## 2019-08-02 NOTE — Progress Notes (Signed)
08/02/19  RN confirming patient taking calcium supplements at home.  Henreitta Leber, PharmD

## 2019-08-02 NOTE — Patient Instructions (Signed)
[facility] Discharge Instructions for Patients Receiving Chemotherapy  Today you received the following chemotherapy agents   To help prevent nausea and vomiting after your treatment, we encourage you to take your nausea medication   If you develop nausea and vomiting that is not controlled by your nausea medication, call the clinic.   BELOW ARE SYMPTOMS THAT SHOULD BE REPORTED IMMEDIATELY:  *FEVER GREATER THAN 100.5 F  *CHILLS WITH OR WITHOUT FEVER  NAUSEA AND VOMITING THAT IS NOT CONTROLLED WITH YOUR NAUSEA MEDICATION  *UNUSUAL SHORTNESS OF BREATH  *UNUSUAL BRUISING OR BLEEDING  TENDERNESS IN MOUTH AND THROAT WITH OR WITHOUT PRESENCE OF ULCERS  *URINARY PROBLEMS  *BOWEL PROBLEMS  UNUSUAL RASH Items with * indicate a potential emergency and should be followed up as soon as possible.  Feel free to call the clinic should you have any questions or concerns. The clinic phone number is [phone].  Please show the CHEMO ALERT CARD at check-in to the Emergency Department and triage nurse.   

## 2019-08-02 NOTE — Progress Notes (Signed)
Patient presents today for treatment Kyprolis Day 9. Vital signs are stable and within parameters for treatment. Patient has no complaints of any pain or changes since his last visit.   Treatment given today per MD orders. Tolerated infusion without adverse affects. Vital signs stable. No complaints at this time. Discharged from clinic ambulatory. F/U with [facility] as scheduled.

## 2019-08-04 ENCOUNTER — Ambulatory Visit (HOSPITAL_COMMUNITY): Payer: Medicare Other

## 2019-08-04 ENCOUNTER — Other Ambulatory Visit (HOSPITAL_COMMUNITY): Payer: Medicare Other

## 2019-08-05 ENCOUNTER — Ambulatory Visit (HOSPITAL_COMMUNITY): Payer: Medicare Other

## 2019-08-08 ENCOUNTER — Inpatient Hospital Stay (HOSPITAL_COMMUNITY): Payer: Medicare Other

## 2019-08-08 ENCOUNTER — Ambulatory Visit (HOSPITAL_COMMUNITY): Payer: Medicare Other | Admitting: Hematology

## 2019-08-08 ENCOUNTER — Other Ambulatory Visit: Payer: Self-pay

## 2019-08-08 ENCOUNTER — Other Ambulatory Visit (HOSPITAL_COMMUNITY): Payer: Medicare Other

## 2019-08-08 DIAGNOSIS — Z5112 Encounter for antineoplastic immunotherapy: Secondary | ICD-10-CM | POA: Diagnosis not present

## 2019-08-08 DIAGNOSIS — C9000 Multiple myeloma not having achieved remission: Secondary | ICD-10-CM

## 2019-08-08 LAB — CBC WITH DIFFERENTIAL/PLATELET
Abs Immature Granulocytes: 0.05 10*3/uL (ref 0.00–0.07)
Basophils Absolute: 0.1 10*3/uL (ref 0.0–0.1)
Basophils Relative: 1 %
Eosinophils Absolute: 0.8 10*3/uL — ABNORMAL HIGH (ref 0.0–0.5)
Eosinophils Relative: 19 %
HCT: 27.4 % — ABNORMAL LOW (ref 39.0–52.0)
Hemoglobin: 9.2 g/dL — ABNORMAL LOW (ref 13.0–17.0)
Immature Granulocytes: 1 %
Lymphocytes Relative: 14 %
Lymphs Abs: 0.6 10*3/uL — ABNORMAL LOW (ref 0.7–4.0)
MCH: 39.3 pg — ABNORMAL HIGH (ref 26.0–34.0)
MCHC: 33.6 g/dL (ref 30.0–36.0)
MCV: 117.1 fL — ABNORMAL HIGH (ref 80.0–100.0)
Monocytes Absolute: 1 10*3/uL (ref 0.1–1.0)
Monocytes Relative: 23 %
Neutro Abs: 1.8 10*3/uL (ref 1.7–7.7)
Neutrophils Relative %: 42 %
Platelets: 131 10*3/uL — ABNORMAL LOW (ref 150–400)
RBC: 2.34 MIL/uL — ABNORMAL LOW (ref 4.22–5.81)
RDW: 16.6 % — ABNORMAL HIGH (ref 11.5–15.5)
WBC: 4.3 10*3/uL (ref 4.0–10.5)
nRBC: 0 % (ref 0.0–0.2)

## 2019-08-08 LAB — COMPREHENSIVE METABOLIC PANEL
ALT: 14 U/L (ref 0–44)
AST: 12 U/L — ABNORMAL LOW (ref 15–41)
Albumin: 3.4 g/dL — ABNORMAL LOW (ref 3.5–5.0)
Alkaline Phosphatase: 54 U/L (ref 38–126)
Anion gap: 9 (ref 5–15)
BUN: 67 mg/dL — ABNORMAL HIGH (ref 6–20)
CO2: 20 mmol/L — ABNORMAL LOW (ref 22–32)
Calcium: 8.6 mg/dL — ABNORMAL LOW (ref 8.9–10.3)
Chloride: 106 mmol/L (ref 98–111)
Creatinine, Ser: 6.26 mg/dL — ABNORMAL HIGH (ref 0.61–1.24)
GFR calc Af Amer: 10 mL/min — ABNORMAL LOW (ref >60)
GFR calc non Af Amer: 9 mL/min — ABNORMAL LOW (ref >60)
Glucose, Bld: 87 mg/dL (ref 70–99)
Potassium: 4.8 mmol/L (ref 3.5–5.1)
Sodium: 135 mmol/L (ref 135–145)
Total Bilirubin: 1 mg/dL (ref 0.3–1.2)
Total Protein: 5.4 g/dL — ABNORMAL LOW (ref 6.5–8.1)

## 2019-08-08 LAB — LACTATE DEHYDROGENASE: LDH: 142 U/L (ref 98–192)

## 2019-08-08 LAB — MAGNESIUM: Magnesium: 2.2 mg/dL (ref 1.7–2.4)

## 2019-08-08 MED ORDER — SODIUM CHLORIDE 0.9% FLUSH
10.0000 mL | Freq: Once | INTRAVENOUS | Status: AC
  Administered 2019-08-08: 10 mL via INTRAVENOUS

## 2019-08-08 MED ORDER — LANREOTIDE ACETATE 120 MG/0.5ML ~~LOC~~ SOLN
SUBCUTANEOUS | Status: AC
  Filled 2019-08-08: qty 120

## 2019-08-08 MED ORDER — SODIUM CHLORIDE 0.9 % IV SOLN: INTRAVENOUS | Status: AC

## 2019-08-08 MED ORDER — HEPARIN SOD (PORK) LOCK FLUSH 100 UNIT/ML IV SOLN
500.0000 [IU] | Freq: Once | INTRAVENOUS | Status: AC
  Administered 2019-08-08: 500 [IU] via INTRAVENOUS

## 2019-08-08 NOTE — Patient Instructions (Signed)
[facility] at [facility] _______________________________________________________________  Thank you for choosing [facility] at [facility] to provide your oncology and hematology care.  To afford each patient quality time with our providers, please arrive at least 15 minutes before your scheduled appointment.  You need to re-schedule your appointment if you arrive 10 or more minutes late.  We strive to give you quality time with our providers, and arriving late affects you and other patients whose appointments are after yours.  Also, if you no show three or more times for appointments you may be dismissed from the clinic.  Again, thank you for choosing [facility] at [facility]. Our hope is that these requests will allow you access to exceptional care and in a timely manner. _______________________________________________________________  If you have questions after your visit, please contact our office at [phone] between the hours of 8:30 a.m. and 5:00 p.m. Voicemails left after 4:30 p.m. will not be returned until the following business day. _______________________________________________________________  For prescription refill requests, have your pharmacy contact our office. _______________________________________________________________  Recommendations made by the consultant and any test results will be sent to your referring physician. _______________________________________________________________ 

## 2019-08-08 NOTE — Progress Notes (Signed)
.  Pharmacist Chemotherapy Monitoring - Follow Up Assessment    I verify that I have reviewed each item in the below checklist:  . Regimen for the patient is scheduled for the appropriate day and plan matches scheduled date. Marland Kitchen Appropriate non-routine labs are ordered dependent on drug ordered. . If applicable, additional medications reviewed and ordered per protocol based on lifetime cumulative doses and/or treatment regimen.   Plan for follow-up and/or issues identified: No . I-vent associated with next due treatment: No . MD and/or nursing notified: No  [name] 08/08/2019 2:50 PM

## 2019-08-08 NOTE — Progress Notes (Signed)
1100- Lab work reviewed with Dr. Delton Coombes. Cr has increased from 5.2 to 6.2. Per MD, hold Kyprolis today, give 1L NS and repeat labs tomorrow for possible treatment.   [name] tolerated IVF without incident or complaint. VSS. Discharged in satisfactory condition with follow up instructions.

## 2019-08-09 ENCOUNTER — Other Ambulatory Visit (HOSPITAL_COMMUNITY): Payer: Self-pay | Admitting: *Deleted

## 2019-08-09 ENCOUNTER — Inpatient Hospital Stay (HOSPITAL_COMMUNITY): Payer: Medicare Other

## 2019-08-09 VITALS — BP 174/78 | HR 80 | Temp 97.5°F | Resp 18

## 2019-08-09 DIAGNOSIS — C9000 Multiple myeloma not having achieved remission: Secondary | ICD-10-CM

## 2019-08-09 DIAGNOSIS — Z5112 Encounter for antineoplastic immunotherapy: Secondary | ICD-10-CM | POA: Diagnosis not present

## 2019-08-09 DIAGNOSIS — N185 Chronic kidney disease, stage 5: Secondary | ICD-10-CM

## 2019-08-09 LAB — PROTEIN ELECTROPHORESIS, SERUM
A/G Ratio: 1.7 (ref 0.7–1.7)
Albumin ELP: 3.3 g/dL (ref 2.9–4.4)
Alpha-1-Globulin: 0.1 g/dL (ref 0.0–0.4)
Alpha-2-Globulin: 0.5 g/dL (ref 0.4–1.0)
Beta Globulin: 0.7 g/dL (ref 0.7–1.3)
Gamma Globulin: 0.6 g/dL (ref 0.4–1.8)
Globulin, Total: 1.9 g/dL — ABNORMAL LOW (ref 2.2–3.9)
M-Spike, %: 0.1 g/dL — ABNORMAL HIGH
Total Protein ELP: 5.2 g/dL — ABNORMAL LOW (ref 6.0–8.5)

## 2019-08-09 LAB — COMPREHENSIVE METABOLIC PANEL
ALT: 16 U/L (ref 0–44)
AST: 12 U/L — ABNORMAL LOW (ref 15–41)
Albumin: 3.7 g/dL (ref 3.5–5.0)
Alkaline Phosphatase: 60 U/L (ref 38–126)
Anion gap: 10 (ref 5–15)
BUN: 60 mg/dL — ABNORMAL HIGH (ref 6–20)
CO2: 22 mmol/L (ref 22–32)
Calcium: 9 mg/dL (ref 8.9–10.3)
Chloride: 106 mmol/L (ref 98–111)
Creatinine, Ser: 5.81 mg/dL — ABNORMAL HIGH (ref 0.61–1.24)
GFR calc Af Amer: 11 mL/min — ABNORMAL LOW (ref >60)
GFR calc non Af Amer: 10 mL/min — ABNORMAL LOW (ref >60)
Glucose, Bld: 95 mg/dL (ref 70–99)
Potassium: 5.1 mmol/L (ref 3.5–5.1)
Sodium: 138 mmol/L (ref 135–145)
Total Bilirubin: 0.9 mg/dL (ref 0.3–1.2)
Total Protein: 5.8 g/dL — ABNORMAL LOW (ref 6.5–8.1)

## 2019-08-09 LAB — KAPPA/LAMBDA LIGHT CHAINS
Kappa free light chain: 561.6 mg/L — ABNORMAL HIGH (ref 3.3–19.4)
Kappa, lambda light chain ratio: 15.22 — ABNORMAL HIGH (ref 0.26–1.65)
Lambda free light chains: 36.9 mg/L — ABNORMAL HIGH (ref 5.7–26.3)

## 2019-08-09 MED ORDER — HEPARIN SOD (PORK) LOCK FLUSH 100 UNIT/ML IV SOLN
500.0000 [IU] | Freq: Once | INTRAVENOUS | Status: AC
  Administered 2019-08-09: 500 [IU] via INTRAVENOUS

## 2019-08-09 MED ORDER — SODIUM CHLORIDE 0.9 % IV SOLN: INTRAVENOUS | Status: DC

## 2019-08-09 MED ORDER — EPOETIN ALFA-EPBX 10000 UNIT/ML IJ SOLN
20000.0000 [IU] | Freq: Once | INTRAMUSCULAR | Status: AC
  Administered 2019-08-09: 20000 [IU] via SUBCUTANEOUS
  Filled 2019-08-09: qty 2

## 2019-08-09 MED ORDER — SODIUM CHLORIDE 0.9% FLUSH
10.0000 mL | Freq: Once | INTRAVENOUS | Status: AC
  Administered 2019-08-09: 10 mL via INTRAVENOUS

## 2019-08-09 MED ORDER — HYDROMORPHONE HCL 4 MG PO TABS: 4.0000 mg | ORAL_TABLET | ORAL | 0 refills | Status: DC | PRN

## 2019-08-09 NOTE — Progress Notes (Signed)
Labs reviewed with MD today. Will hold treatment, give him one liter of hydration today. Follow up as scheduled per MD.    Reticrit given per orders. See MAR.   Patient tolerated it well without problems. Vitals stable and discharged home from clinic ambulatory. Follow up as scheduled.

## 2019-08-09 NOTE — Patient Instructions (Signed)
[facility] at [facility] Discharge Instructions  Labs drawn from portacath today   Thank you for choosing [facility] at [facility] to provide your oncology and hematology care.  To afford each patient quality time with our provider, please arrive at least 15 minutes before your scheduled appointment time.   If you have a lab appointment with the Cancer Center please come in thru the Main Entrance and check in at the main information desk.  You need to re-schedule your appointment should you arrive 10 or more minutes late.  We strive to give you quality time with our providers, and arriving late affects you and other patients whose appointments are after yours.  Also, if you no show three or more times for appointments you may be dismissed from the clinic at the providers discretion.     Again, thank you for choosing [facility].  Our hope is that these requests will decrease the amount of time that you wait before being seen by our physicians.       _____________________________________________________________  Should you have questions after your visit to  Cancer Center, please contact our office at [phone] between the hours of 8:00 a.m. and 4:30 p.m.  Voicemails left after 4:00 p.m. will not be returned until the following business day.  For prescription refill requests, have your pharmacy contact our office and allow 72 hours.    Due to Covid, you will need to wear a mask upon entering the hospital. If you do not have a mask, a mask will be given to you at the Main Entrance upon arrival. For doctor visits, patients may have 1 support person with them. For treatment visits, patients can not have anyone with them due to social distancing guidelines and our immunocompromised population.     

## 2019-08-10 ENCOUNTER — Ambulatory Visit (HOSPITAL_COMMUNITY): Payer: Medicare Other

## 2019-08-15 ENCOUNTER — Other Ambulatory Visit (HOSPITAL_COMMUNITY): Payer: Self-pay | Admitting: *Deleted

## 2019-08-15 DIAGNOSIS — C9000 Multiple myeloma not having achieved remission: Secondary | ICD-10-CM

## 2019-08-15 MED ORDER — POMALIDOMIDE 3 MG PO CAPS: 3.0000 mg | ORAL_CAPSULE | Freq: Every day | ORAL | 0 refills | Status: DC

## 2019-08-19 NOTE — Progress Notes (Signed)

## 2019-08-22 ENCOUNTER — Inpatient Hospital Stay (HOSPITAL_BASED_OUTPATIENT_CLINIC_OR_DEPARTMENT_OTHER): Payer: Medicare Other | Admitting: Hematology

## 2019-08-22 ENCOUNTER — Inpatient Hospital Stay (HOSPITAL_COMMUNITY): Payer: Medicare Other

## 2019-08-22 ENCOUNTER — Other Ambulatory Visit: Payer: Self-pay

## 2019-08-22 VITALS — BP 132/43 | HR 41 | Temp 97.3°F | Resp 18 | Wt 160.4 lb

## 2019-08-22 VITALS — BP 128/58 | HR 63 | Temp 97.2°F | Resp 18

## 2019-08-22 DIAGNOSIS — C9000 Multiple myeloma not having achieved remission: Secondary | ICD-10-CM

## 2019-08-22 DIAGNOSIS — N185 Chronic kidney disease, stage 5: Secondary | ICD-10-CM

## 2019-08-22 DIAGNOSIS — Z5112 Encounter for antineoplastic immunotherapy: Secondary | ICD-10-CM | POA: Diagnosis not present

## 2019-08-22 LAB — CBC WITH DIFFERENTIAL/PLATELET
Abs Immature Granulocytes: 0 10*3/uL (ref 0.00–0.07)
Basophils Absolute: 0.2 10*3/uL — ABNORMAL HIGH (ref 0.0–0.1)
Basophils Relative: 5 %
Eosinophils Absolute: 0.6 10*3/uL — ABNORMAL HIGH (ref 0.0–0.5)
Eosinophils Relative: 14 %
HCT: 31.2 % — ABNORMAL LOW (ref 39.0–52.0)
Hemoglobin: 10.1 g/dL — ABNORMAL LOW (ref 13.0–17.0)
Immature Granulocytes: 0 %
Lymphocytes Relative: 16 %
Lymphs Abs: 0.7 10*3/uL (ref 0.7–4.0)
MCH: 38.8 pg — ABNORMAL HIGH (ref 26.0–34.0)
MCHC: 32.4 g/dL (ref 30.0–36.0)
MCV: 120 fL — ABNORMAL HIGH (ref 80.0–100.0)
Monocytes Absolute: 1.1 10*3/uL — ABNORMAL HIGH (ref 0.1–1.0)
Monocytes Relative: 25 %
Neutro Abs: 1.8 10*3/uL (ref 1.7–7.7)
Neutrophils Relative %: 40 %
Platelets: 217 10*3/uL (ref 150–400)
RBC: 2.6 MIL/uL — ABNORMAL LOW (ref 4.22–5.81)
RDW: 16.3 % — ABNORMAL HIGH (ref 11.5–15.5)
WBC: 4.3 10*3/uL (ref 4.0–10.5)
nRBC: 0 % (ref 0.0–0.2)

## 2019-08-22 LAB — COMPREHENSIVE METABOLIC PANEL
ALT: 10 U/L (ref 0–44)
AST: 13 U/L — ABNORMAL LOW (ref 15–41)
Albumin: 3.9 g/dL (ref 3.5–5.0)
Alkaline Phosphatase: 57 U/L (ref 38–126)
Anion gap: 10 (ref 5–15)
BUN: 51 mg/dL — ABNORMAL HIGH (ref 6–20)
CO2: 23 mmol/L (ref 22–32)
Calcium: 9.7 mg/dL (ref 8.9–10.3)
Chloride: 103 mmol/L (ref 98–111)
Creatinine, Ser: 6.85 mg/dL — ABNORMAL HIGH (ref 0.61–1.24)
GFR calc Af Amer: 9 mL/min — ABNORMAL LOW (ref >60)
GFR calc non Af Amer: 8 mL/min — ABNORMAL LOW (ref >60)
Glucose, Bld: 90 mg/dL (ref 70–99)
Potassium: 4.4 mmol/L (ref 3.5–5.1)
Sodium: 136 mmol/L (ref 135–145)
Total Bilirubin: 0.4 mg/dL (ref 0.3–1.2)
Total Protein: 6.3 g/dL — ABNORMAL LOW (ref 6.5–8.1)

## 2019-08-22 LAB — MAGNESIUM: Magnesium: 2.2 mg/dL (ref 1.7–2.4)

## 2019-08-22 MED ORDER — EPOETIN ALFA-EPBX 10000 UNIT/ML IJ SOLN
20000.0000 [IU] | Freq: Once | INTRAMUSCULAR | Status: AC
  Administered 2019-08-22: 20000 [IU] via SUBCUTANEOUS

## 2019-08-22 MED ORDER — SODIUM CHLORIDE 0.9% FLUSH
10.0000 mL | INTRAVENOUS | Status: DC | PRN
  Administered 2019-08-22: 10 mL

## 2019-08-22 MED ORDER — SODIUM CHLORIDE 0.9 % IV SOLN
20.0000 mg | Freq: Once | INTRAVENOUS | Status: AC
  Administered 2019-08-22: 20 mg via INTRAVENOUS
  Filled 2019-08-22: qty 2

## 2019-08-22 MED ORDER — HEPARIN SOD (PORK) LOCK FLUSH 100 UNIT/ML IV SOLN
500.0000 [IU] | Freq: Once | INTRAVENOUS | Status: AC | PRN
  Administered 2019-08-22: 500 [IU]

## 2019-08-22 MED ORDER — DEXTROSE 5 % IV SOLN
27.0000 mg/m2 | Freq: Once | INTRAVENOUS | Status: AC
  Administered 2019-08-22: 56 mg via INTRAVENOUS
  Filled 2019-08-22: qty 28

## 2019-08-22 MED ORDER — EPOETIN ALFA-EPBX 10000 UNIT/ML IJ SOLN
INTRAMUSCULAR | Status: AC
  Filled 2019-08-22: qty 2

## 2019-08-22 MED ORDER — SODIUM CHLORIDE 0.9 % IV SOLN: Freq: Once | INTRAVENOUS | Status: AC

## 2019-08-22 MED ORDER — DENOSUMAB 120 MG/1.7ML ~~LOC~~ SOLN
120.0000 mg | Freq: Once | SUBCUTANEOUS | Status: AC
  Administered 2019-08-22: 120 mg via SUBCUTANEOUS
  Filled 2019-08-22: qty 1.7

## 2019-08-22 MED ORDER — ONDANSETRON HCL 4 MG/2ML IJ SOLN
4.0000 mg | Freq: Once | INTRAMUSCULAR | Status: AC
  Administered 2019-08-22: 4 mg via INTRAVENOUS
  Filled 2019-08-22: qty 2

## 2019-08-22 NOTE — Progress Notes (Signed)
Patient has been assessed, vital signs and labs have been reviewed by Dr. Katragadda. ANC, Creatinine, LFTs, and Platelets are within treatment parameters per Dr. Katragadda. The patient is good to proceed with treatment at this time.  

## 2019-08-22 NOTE — Progress Notes (Signed)
Patient presents today for treatment and follow up visit with Dr. Delton Coombes. Creatinine today 6.85. Vital signs within parameters for treatment. Labs reviewed by MD. Per verbal order from Dr. Delton Coombes proceed with treatment, infuse 561ml total volume of Normal Saline prior to Kyprolis.   Treatment given today per MD orders. Tolerated infusion without adverse affects. Vital signs stable. No complaints at this time. Discharged from clinic ambulatory. F/U with [facility] as scheduled.

## 2019-08-22 NOTE — Patient Instructions (Signed)
[place] at [facility] Discharge Instructions  You were seen today by Dr. Delton Coombes. He went over your recent lab results. He will schedule you for repeat scans to reevaluate your back pain.  He will see you back in 4 weeks for labs, treatment and follow up.   Thank you for choosing [place] at [facility] to provide your oncology and hematology care.  To afford each patient quality time with our provider, please arrive at least 15 minutes before your scheduled appointment time.   If you have a lab appointment with the [facility] please come in thru the  Main Entrance and check in at the main information desk  You need to re-schedule your appointment should you arrive 10 or more minutes late.  We strive to give you quality time with our providers, and arriving late affects you and other patients whose appointments are after yours.  Also, if you no show three or more times for appointments you may be dismissed from the clinic at the providers discretion.     Again, thank you for choosing [facility].  Our hope is that these requests will decrease the amount of time that you wait before being seen by our physicians.       _____________________________________________________________  Should you have questions after your visit to [facility], please contact our office at [phone] between the hours of 8:00 a.m. and 4:30 p.m.  Voicemails left after 4:00 p.m. will not be returned until the following business day.  For prescription refill requests, have your pharmacy contact our office and allow 72 hours.    Cancer Center Support Programs:   > Cancer Support Group  2nd Tuesday of the month 1pm-2pm, Journey Room

## 2019-08-22 NOTE — Progress Notes (Signed)
Patient has been assessed, vital signs and labs have been reviewed by Dr. Delton Coombes. ANC, LFTs, and Platelets are within treatment parameters,Creatinine is 6.85 today give 500 mL NS per Dr. Delton Coombes. The patient is good to proceed with treatment at this time.

## 2019-08-22 NOTE — Patient Instructions (Signed)
[facility] at [facility] Discharge Instructions  Labs drawn from portacath today   Thank you for choosing [facility] at [facility] to provide your oncology and hematology care.  To afford each patient quality time with our provider, please arrive at least 15 minutes before your scheduled appointment time.   If you have a lab appointment with the Cancer Center please come in thru the Main Entrance and check in at the main information desk.  You need to re-schedule your appointment should you arrive 10 or more minutes late.  We strive to give you quality time with our providers, and arriving late affects you and other patients whose appointments are after yours.  Also, if you no show three or more times for appointments you may be dismissed from the clinic at the providers discretion.     Again, thank you for choosing [facility].  Our hope is that these requests will decrease the amount of time that you wait before being seen by our physicians.       _____________________________________________________________  Should you have questions after your visit to  Cancer Center, please contact our office at [phone] between the hours of 8:00 a.m. and 4:30 p.m.  Voicemails left after 4:00 p.m. will not be returned until the following business day.  For prescription refill requests, have your pharmacy contact our office and allow 72 hours.    Due to Covid, you will need to wear a mask upon entering the hospital. If you do not have a mask, a mask will be given to you at the Main Entrance upon arrival. For doctor visits, patients may have 1 support person with them. For treatment visits, patients can not have anyone with them due to social distancing guidelines and our immunocompromised population.     

## 2019-08-22 NOTE — Progress Notes (Signed)
[place], [place] [address]   CLINIC:  Medical Oncology/Hematology  PCP:  Rosita Fire, MD [place] [id number] [phone]   REASON FOR VISIT: Follow-up for Multiple myeloma    BRIEF ONCOLOGIC HISTORY:  Oncology History  Kappa light chain myeloma ([facility])  06/16/2018 Initial Diagnosis   Kappa light chain myeloma ([facility])   06/17/2018 - 04/10/2019 Chemotherapy   The patient had palonosetron (ALOXI) injection 0.25 mg, 0.25 mg, Intravenous,  Once, 27 of 27 cycles Administration: 0.25 mg (06/17/2018), 0.25 mg (06/23/2018), 0.25 mg (06/30/2018), 0.25 mg (07/07/2018), 0.25 mg (07/14/2018), 0.25 mg (07/22/2018), 0.25 mg (07/29/2018), 0.25 mg (08/06/2018), 0.25 mg (08/13/2018), 0.25 mg (08/20/2018), 0.25 mg (08/27/2018), 0.25 mg (09/06/2018), 0.25 mg (09/13/2018), 0.25 mg (09/20/2018), 0.25 mg (09/27/2018), 0.25 mg (10/04/2018), 0.25 mg (10/11/2018), 0.25 mg (10/18/2018), 0.25 mg (10/25/2018), 0.25 mg (11/01/2018), 0.25 mg (11/08/2018), 0.25 mg (11/15/2018), 0.25 mg (11/22/2018), 0.25 mg (11/29/2018), 0.25 mg (12/08/2018), 0.25 mg (12/16/2018) bortezomib SQ (VELCADE) chemo injection 2.5 mg, 1.3 mg/m2 = 2.5 mg, Subcutaneous,  Once, 41 of 42 cycles Administration: 2.5 mg (06/17/2018), 2.5 mg (06/23/2018), 2.5 mg (06/30/2018), 2.5 mg (07/07/2018), 2.5 mg (07/14/2018), 2.5 mg (07/22/2018), 2.5 mg (07/29/2018), 2.5 mg (08/06/2018), 2.5 mg (08/13/2018), 2.5 mg (08/20/2018), 2.5 mg (08/27/2018), 2.5 mg (09/06/2018), 2.5 mg (09/13/2018), 2.5 mg (09/20/2018), 2.5 mg (09/27/2018), 2.5 mg (10/04/2018), 2.5 mg (10/11/2018), 2.5 mg (10/18/2018), 2.5 mg (10/25/2018), 2.5 mg (11/01/2018), 2.5 mg (11/08/2018), 2.5 mg (11/15/2018), 2.5 mg (11/22/2018), 2.5 mg (11/29/2018), 2.5 mg (12/08/2018), 2.5 mg (12/16/2018), 2.5 mg (12/27/2018), 2.5 mg (01/03/2019), 2.5 mg (01/10/2019), 2.5 mg (01/17/2019), 2.5 mg (01/24/2019), 2.5 mg (01/31/2019), 2.5 mg (02/07/2019), 2.5 mg (02/14/2019), 2.5 mg (02/21/2019), 2.5 mg (02/28/2019), 2.5  mg (03/07/2019), 2.5 mg (03/14/2019), 2.5 mg (03/21/2019), 2.5 mg (03/29/2019), 2.5 mg (04/04/2019) cyclophosphamide (CYTOXAN) 300 mg in sodium chloride 0.9 % 250 mL chemo infusion, 300 mg (100 % of original dose 300 mg), Intravenous,  Once, 26 of 26 cycles Dose modification: 300 mg (original dose 300 mg, Cycle 1, Reason: Change in SCr/CrCl), 500 mg (original dose 500 mg, Cycle 2, Reason: Provider Judgment), 500 mg (original dose 500 mg, Cycle 3, Reason: Change in SCr/CrCl) Administration: 300 mg (06/17/2018), 500 mg (06/23/2018), 500 mg (06/30/2018), 600 mg (07/07/2018), 600 mg (07/14/2018), 600 mg (07/22/2018), 600 mg (07/29/2018), 600 mg (08/06/2018), 600 mg (08/13/2018), 600 mg (08/20/2018), 600 mg (08/27/2018), 600 mg (09/06/2018), 600 mg (09/13/2018), 600 mg (09/20/2018), 600 mg (09/27/2018), 600 mg (10/04/2018), 600 mg (10/11/2018), 600 mg (10/18/2018), 600 mg (10/25/2018), 600 mg (11/01/2018), 600 mg (11/08/2018), 600 mg (11/15/2018), 600 mg (11/22/2018), 600 mg (11/29/2018), 600 mg (12/08/2018), 600 mg (12/16/2018)  for chemotherapy treatment.    04/21/2019 -  Chemotherapy   The patient had ondansetron (ZOFRAN) injection 4 mg, 4 mg (100 % of original dose 4 mg), Intravenous,  Once, 3 of 5 cycles Dose modification: 8 mg (original dose 4 mg, Cycle 2), 4 mg (original dose 4 mg, Cycle 2), 4 mg (original dose 4 mg, Cycle 2) Administration: 4 mg (06/09/2019), 4 mg (06/10/2019), 4 mg (06/23/2019), 4 mg (06/24/2019), 4 mg (07/04/2019), 4 mg (07/05/2019), 4 mg (07/11/2019), 4 mg (07/12/2019), 4 mg (07/25/2019), 4 mg (07/26/2019), 4 mg (08/01/2019), 4 mg (08/02/2019) carfilzomib (KYPROLIS) 40 mg in dextrose 5 % 50 mL chemo infusion, 20 mg/m2 = 40 mg, Intravenous,  Once, 4 of 6 cycles Dose modification: 27 mg/m2 (original dose 27 mg/m2, Cycle 1, Reason: Provider Judgment) Administration: 40 mg (  04/21/2019), 40 mg (04/22/2019), 60 mg (04/28/2019), 60 mg (04/29/2019), 60 mg (05/05/2019), 60 mg (05/06/2019), 56 mg (05/26/2019), 56 mg (05/27/2019), 56 mg (06/02/2019), 56 mg  (06/03/2019), 56 mg (06/09/2019), 56 mg (06/10/2019), 56 mg (06/23/2019), 56 mg (06/24/2019), 56 mg (07/04/2019), 56 mg (07/05/2019), 56 mg (07/11/2019), 56 mg (07/12/2019), 56 mg (07/25/2019), 56 mg (07/26/2019), 56 mg (08/01/2019), 56 mg (08/02/2019)  for chemotherapy treatment.       INTERVAL HISTORY:  Mr. Messer 59 y.o. male seen for follow-up of multiple myeloma and toxicity assessment prior to next cycle of chemotherapy.  He is tolerating pomalidomide very well.  Denies any GI side effects.  Complains of low back pain rated as 8 out of 10.  Appetite and energy levels are 25%.  No fevers or chills noted.  REVIEW OF SYSTEMS:  Review of Systems  Musculoskeletal: Positive for back pain.  Neurological: Positive for headaches.  Psychiatric/Behavioral: Positive for sleep disturbance.  All other systems reviewed and are negative.    PAST MEDICAL/SURGICAL HISTORY:  Past Medical History:  Diagnosis Date  . Allergy   . Arthritis    neck and back  . Blood transfusion without reported diagnosis   . BPH (benign prostatic hyperplasia)   . Cancer [facility]) 2004   testicle  . Chronic kidney disease    kidney stones  . Left lumbar radiculopathy 06/12/2016  . 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 Medical history non-contributory    Pt has scattered thoughts and uncertain of past medical history  . 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 Stones, urinary tract   . Substance abuse ([facility])    prescribed oxydcodone- 10 years   Past Surgical History:  Procedure Laterality Date  . BACK SURGERY     x5  . CERVICAL DISC SURGERY     x2  . COLONOSCOPY WITH PROPOFOL N/A 08/11/2016   Dr. Gala Romney: non-bleeding internal hemorrhoids, one 4 mm hyperplastic rectal polyp, diverticulosis in entire examined colon  . POLYPECTOMY  08/11/2016   Procedure: POLYPECTOMY;  Surgeon: [name], MD;  Location: [facility];  Service: Endoscopy;;  colon  . PORTACATH PLACEMENT Left 09/20/2018   Procedure: INSERTION PORT-A-CATH (catheter  attached left subclavian);  Surgeon: Virl Cagey, MD;  Location: [facility] ORS;  Service: General;  Laterality: Left;  . testicular cancer  2004  . TONSILLECTOMY       SOCIAL HISTORY:  Social History   Socioeconomic History  . Marital status: Single    Spouse name: Not on file  . Number of children: 1  . Years of education: 60  . Highest education level: Not on file  Occupational History  . Occupation: retired    Comment: Psychologist, prison and probation services  . Occupation: disabled  Tobacco Use  . Smoking status: Current Every Day Smoker    Packs/day: 1.00    Years: 40.00    Pack years: 40.00    Types: Cigarettes    Start date: 03/31/1974  . Smokeless tobacco: Never Used  Substance and Sexual Activity  . Alcohol use: Yes    Comment: Occasional  . Drug use: No    Types: Cocaine    Comment: Remote hx of cocaine, quit 1989  . Sexual activity: Yes  Other Topics Concern  . Not on file  Social History Narrative   [employer] for 12 years   [employer] for 36 years      Never married   One daughter      Lives alone   Horticulturist, commercial  trumpet   Right-handed   Occasional caffeine use         Social Determinants of Radio broadcast assistant Strain:   . Difficulty of Paying Living Expenses:   Food Insecurity:   . Worried About Charity fundraiser in the Last Year:   . Arboriculturist in the Last Year:   Transportation Needs:   . Film/video editor (Medical):   Marland Kitchen Lack of Transportation (Non-Medical):   Physical Activity:   . Days of Exercise per Week:   . Minutes of Exercise per Session:   Stress:   . Feeling of Stress :   Social Connections:   . Frequency of Communication with Friends and Family:   . Frequency of Social Gatherings with Friends and Family:   . Attends Religious Services:   . Active Member of Clubs or Organizations:   . Attends Archivist Meetings:   Marland Kitchen Marital Status:   Intimate Partner Violence: Not At Risk  . Fear of Current or Ex-Partner: No  .  Emotionally Abused: No  . Physically Abused: No  . Sexually Abused: No    FAMILY HISTORY:  Family History  Problem Relation Age of Onset  . COPD Mother   . Heart disease Mother   . Other Father        Never knew his father.  . Thyroid disease Sister   . Arthritis Sister   . Heart disease Sister        bypass  . Colon cancer Neg Hx     CURRENT MEDICATIONS:  Outpatient Encounter Medications as of 08/22/2019  Medication Sig  . aspirin EC 81 MG tablet Take 81 mg by mouth daily.  . bortezomib IV (VELCADE) 3.5 MG injection Inject 1.3 mg/m2 into the vein once a week.   . calcitRIOL (ROCALTROL) 0.5 MCG capsule Take 0.5 mcg by mouth daily.   Marland Kitchen CARFILZOMIB IV Inject into the vein.  . CYCLOPHOSPHAMIDE IV Inject into the vein once a week.  Marland Kitchen Epoetin Alfa (PROCRIT IJ) Inject as directed. Unsure of dosage- gets this once weekly  . HYDROmorphone (DILAUDID) 4 MG tablet Take 1 tablet (4 mg total) by mouth every 4 (four) hours as needed for severe pain.  . Patiromer Sorbitex Calcium (VELTASSA PO) Take by mouth 2 (two) times a week.   . pomalidomide (POMALYST) 3 MG capsule Take 1 capsule (3 mg total) by mouth daily.  Marland Kitchen senna (SENOKOT) 8.6 MG tablet Take 1 tablet by mouth 2 (two) times daily.   . sodium bicarbonate 650 MG tablet Take 1,300 mg by mouth 2 (two) times daily.  . valACYclovir (VALTREX) 500 MG tablet TAKE 1 TABLET BY MOUTH TWICE A DAY  . Misc. Devices MISC Please provide patient with rollaider.  Dx: multiple myeloma C90.0 (Patient not taking: Reported on 08/22/2019)  . prochlorperazine (COMPAZINE) 5 MG tablet Take 1 tablet (5 mg total) by mouth every 6 (six) hours as needed for nausea or vomiting. (Patient not taking: Reported on 08/22/2019)  . torsemide (DEMADEX) 20 MG tablet TAKING 1 TABLET DAILY, AND 2 TABLETS ONLY IF HIS ANKLES ARE REALLY SWOLLEN (Patient not taking: Reported on 08/22/2019)   No facility-administered encounter medications on file as of 08/22/2019.    ALLERGIES:   Allergies  Allergen Reactions  . Oxycodone-Acetaminophen   . Acetaminophen Nausea Only and Other (See Comments)    Elevated liver enzymes  . Cymbalta [Duloxetine Hcl] Swelling    Facial swelling  . Diclofenac Sodium Swelling       .  Diclofenac Sodium Swelling  . Imodium [Loperamide] Swelling    facial  . Lyrica [Pregabalin] Swelling  . Morphine Other (See Comments)    Bradycardia   . Vioxx [Rofecoxib] Swelling  . Aleve [Naproxen] Swelling    eye     PHYSICAL EXAM:  ECOG Performance status: 1  Vitals:   08/22/19 1036  BP: (!) 132/43  Pulse: (!) 41  Resp: 18  Temp: (!) 97.3 F (36.3 C)  SpO2: 98%   Filed Weights   08/22/19 1036  Weight: 160 lb 6.4 oz (72.8 kg)    Physical Exam Vitals reviewed.  Constitutional:      Appearance: Normal appearance. He is normal weight.  Cardiovascular:     Rate and Rhythm: Normal rate and regular rhythm.     Heart sounds: Normal heart sounds.  Pulmonary:     Effort: Pulmonary effort is normal.     Breath sounds: Normal breath sounds.  Abdominal:     General: Bowel sounds are normal.     Palpations: Abdomen is soft.  Musculoskeletal:        General: Normal range of motion.     Right lower leg: No edema.     Left lower leg: No edema.  Skin:    General: Skin is warm and dry.  Neurological:     Mental Status: He is alert and oriented to person, place, and time. Mental status is at baseline.  Psychiatric:        Mood and Affect: Mood normal.        Behavior: Behavior normal.      LABORATORY DATA:  I have reviewed the labs as listed.  CBC    Component Value Date/Time   WBC 4.3 08/22/2019 1019   RBC 2.60 (L) 08/22/2019 1019   HGB 10.1 (L) 08/22/2019 1019   HCT 31.2 (L) 08/22/2019 1019   PLT 217 08/22/2019 1019   MCV 120.0 (H) 08/22/2019 1019   MCH 38.8 (H) 08/22/2019 1019   MCHC 32.4 08/22/2019 1019   RDW 16.3 (H) 08/22/2019 1019   LYMPHSABS 0.7 08/22/2019 1019   MONOABS 1.1 (H) 08/22/2019 1019   EOSABS 0.6  (H) 08/22/2019 1019   BASOSABS 0.2 (H) 08/22/2019 1019   CMP Latest Ref Rng & Units 08/22/2019 08/09/2019 08/08/2019  Glucose 70 - 99 mg/dL 90 95 87  BUN 6 - 20 mg/dL 51(H) 60(H) 67(H)  Creatinine 0.61 - 1.24 mg/dL 6.85(H) 5.81(H) 6.26(H)  Sodium 135 - 145 mmol/L 136 138 135  Potassium 3.5 - 5.1 mmol/L 4.4 5.1 4.8  Chloride 98 - 111 mmol/L 103 106 106  CO2 22 - 32 mmol/L 23 22 20(L)  Calcium 8.9 - 10.3 mg/dL 9.7 9.0 8.6(L)  Total Protein 6.5 - 8.1 g/dL 6.3(L) 5.8(L) 5.4(L)  Total Bilirubin 0.3 - 1.2 mg/dL 0.4 0.9 1.0  Alkaline Phos 38 - 126 U/L 57 60 54  AST 15 - 41 U/L 13(L) 12(L) 12(L)  ALT 0 - 44 U/L _0 I have reviewed his scans.     ASSESSMENT & PLAN:  1.  Kappa light chain myeloma, high risk, stage III: -CyBorD from 06/17/2018 through 04/04/2019 with progression. -He was evaluated for bone marrow transplant at [facility] but has refused it. -Carfilzomib, pomalidomide and dexamethasone from 04/21/2019. -Myeloma panel from 08/08/2019 shows M spike 0.1 g.  Previously this was not detectable.  Kappa light chains are 561, lambda light chains 36.9 and ratio 15.2.  Ratio was previously 11.2  and prior to that 16.1. -We had to held his carfilzomib day 15 and 16 of his last cycle because of elevated creatinine.  PLAN:  1.  Kappa light chain myeloma: -We have held his carfilzomib last few doses because of elevated creatinine.  However his creatinine did not improve.  His light chains have slightly gone up.  Hence we will restart his treatment today and closely monitor his creatinine. -I will reevaluate him in 4 weeks.  2.  Back pain: -MRI of the lumbar and thoracic spine on 04/20/2019 showed ventral epidural tumor posterior to the T12 vertebral body with mild spinal stenosis. -XRT to the back from 05/22/2019 through 05/27/2019. -He is taking Dilaudid 4 mg every 4 hours which is helping. -However he reports pain in the lower back region.  I have recommended MRI of  the lumbar spine.  Patient initially was reluctant to consider it.  3.  Anemia: -This is from CKD and myeloma. -Hemoglobin is 10.1 today.  He is receiving Retacrit as needed.  4.  Myeloma bone disease: -Continue denosumab monthly.  Does not report any dental issues.  5.  ID/thromboprophylaxis: -Continue Valtrex twice daily and aspirin 81 mg daily.     Orders placed this encounter:  No orders of the defined types were placed in this encounter.    Derek Jack, MD [place] [phone]

## 2019-08-22 NOTE — Patient Instructions (Signed)
[facility] Discharge Instructions for Patients Receiving Chemotherapy  Today you received the following chemotherapy agents   To help prevent nausea and vomiting after your treatment, we encourage you to take your nausea medication   If you develop nausea and vomiting that is not controlled by your nausea medication, call the clinic.   BELOW ARE SYMPTOMS THAT SHOULD BE REPORTED IMMEDIATELY:  *FEVER GREATER THAN 100.5 F  *CHILLS WITH OR WITHOUT FEVER  NAUSEA AND VOMITING THAT IS NOT CONTROLLED WITH YOUR NAUSEA MEDICATION  *UNUSUAL SHORTNESS OF BREATH  *UNUSUAL BRUISING OR BLEEDING  TENDERNESS IN MOUTH AND THROAT WITH OR WITHOUT PRESENCE OF ULCERS  *URINARY PROBLEMS  *BOWEL PROBLEMS  UNUSUAL RASH Items with * indicate a potential emergency and should be followed up as soon as possible.  Feel free to call the clinic should you have any questions or concerns. The clinic phone number is [phone].  Please show the CHEMO ALERT CARD at check-in to the Emergency Department and triage nurse.   

## 2019-08-23 ENCOUNTER — Inpatient Hospital Stay (HOSPITAL_COMMUNITY): Payer: Medicare Other

## 2019-08-23 ENCOUNTER — Encounter (HOSPITAL_COMMUNITY): Payer: Self-pay

## 2019-08-23 VITALS — BP 163/89 | HR 72 | Temp 97.3°F | Resp 18

## 2019-08-23 DIAGNOSIS — Z5112 Encounter for antineoplastic immunotherapy: Secondary | ICD-10-CM | POA: Diagnosis not present

## 2019-08-23 DIAGNOSIS — C9000 Multiple myeloma not having achieved remission: Secondary | ICD-10-CM

## 2019-08-23 MED ORDER — SODIUM CHLORIDE 0.9 % IV SOLN: Freq: Once | INTRAVENOUS | Status: AC

## 2019-08-23 MED ORDER — ONDANSETRON HCL 4 MG/2ML IJ SOLN
4.0000 mg | Freq: Once | INTRAMUSCULAR | Status: AC
  Administered 2019-08-23: 4 mg via INTRAVENOUS
  Filled 2019-08-23: qty 2

## 2019-08-23 MED ORDER — SODIUM CHLORIDE 0.9% FLUSH
10.0000 mL | INTRAVENOUS | Status: DC | PRN
  Administered 2019-08-23: 10 mL

## 2019-08-23 MED ORDER — HEPARIN SOD (PORK) LOCK FLUSH 100 UNIT/ML IV SOLN
500.0000 [IU] | Freq: Once | INTRAVENOUS | Status: AC | PRN
  Administered 2019-08-23: 500 [IU]

## 2019-08-23 MED ORDER — DEXTROSE 5 % IV SOLN
27.0000 mg/m2 | Freq: Once | INTRAVENOUS | Status: AC
  Administered 2019-08-23: 56 mg via INTRAVENOUS
  Filled 2019-08-23: qty 28

## 2019-08-23 NOTE — Progress Notes (Signed)
Patient presents today for treatment. Blood pressure elevated on arrival. BP recheck 160/79. Patient denies headaches, blurred vision , or dizziness. Patient has no complaints of any changes since last treatment.   Treatment given today per MD orders. Tolerated infusion without adverse affects. Vital signs stable. No complaints at this time. Discharged from clinic ambulatory. F/U with [facility] as scheduled.

## 2019-08-23 NOTE — Patient Instructions (Signed)
[facility] Discharge Instructions for Patients Receiving Chemotherapy  Today you received the following chemotherapy agents   To help prevent nausea and vomiting after your treatment, we encourage you to take your nausea medication   If you develop nausea and vomiting that is not controlled by your nausea medication, call the clinic.   BELOW ARE SYMPTOMS THAT SHOULD BE REPORTED IMMEDIATELY:  *FEVER GREATER THAN 100.5 F  *CHILLS WITH OR WITHOUT FEVER  NAUSEA AND VOMITING THAT IS NOT CONTROLLED WITH YOUR NAUSEA MEDICATION  *UNUSUAL SHORTNESS OF BREATH  *UNUSUAL BRUISING OR BLEEDING  TENDERNESS IN MOUTH AND THROAT WITH OR WITHOUT PRESENCE OF ULCERS  *URINARY PROBLEMS  *BOWEL PROBLEMS  UNUSUAL RASH Items with * indicate a potential emergency and should be followed up as soon as possible.  Feel free to call the clinic should you have any questions or concerns. The clinic phone number is [phone].  Please show the CHEMO ALERT CARD at check-in to the Emergency Department and triage nurse.   

## 2019-08-24 MED ORDER — OCTREOTIDE ACETATE 20 MG IM KIT
PACK | INTRAMUSCULAR | Status: AC
  Filled 2019-08-24: qty 1

## 2019-08-24 MED ORDER — OCTREOTIDE ACETATE 30 MG IM KIT
PACK | INTRAMUSCULAR | Status: AC
  Filled 2019-08-24: qty 1

## 2019-08-30 ENCOUNTER — Encounter (HOSPITAL_COMMUNITY): Payer: Self-pay

## 2019-08-30 ENCOUNTER — Inpatient Hospital Stay (HOSPITAL_COMMUNITY): Payer: Medicare Other | Attending: Hematology

## 2019-08-30 ENCOUNTER — Other Ambulatory Visit: Payer: Self-pay

## 2019-08-30 ENCOUNTER — Inpatient Hospital Stay (HOSPITAL_COMMUNITY): Payer: Medicare Other

## 2019-08-30 ENCOUNTER — Other Ambulatory Visit (HOSPITAL_COMMUNITY): Payer: Self-pay | Admitting: Nurse Practitioner

## 2019-08-30 VITALS — BP 148/59 | HR 66 | Temp 97.3°F | Resp 18

## 2019-08-30 DIAGNOSIS — N185 Chronic kidney disease, stage 5: Secondary | ICD-10-CM | POA: Insufficient documentation

## 2019-08-30 DIAGNOSIS — C9000 Multiple myeloma not having achieved remission: Secondary | ICD-10-CM | POA: Insufficient documentation

## 2019-08-30 DIAGNOSIS — Z5112 Encounter for antineoplastic immunotherapy: Secondary | ICD-10-CM | POA: Diagnosis present

## 2019-08-30 DIAGNOSIS — D631 Anemia in chronic kidney disease: Secondary | ICD-10-CM | POA: Diagnosis not present

## 2019-08-30 LAB — COMPREHENSIVE METABOLIC PANEL
ALT: 13 U/L (ref 0–44)
AST: 14 U/L — ABNORMAL LOW (ref 15–41)
Albumin: 3.6 g/dL (ref 3.5–5.0)
Alkaline Phosphatase: 54 U/L (ref 38–126)
Anion gap: 11 (ref 5–15)
BUN: 58 mg/dL — ABNORMAL HIGH (ref 6–20)
CO2: 21 mmol/L — ABNORMAL LOW (ref 22–32)
Calcium: 8.8 mg/dL — ABNORMAL LOW (ref 8.9–10.3)
Chloride: 106 mmol/L (ref 98–111)
Creatinine, Ser: 6.49 mg/dL — ABNORMAL HIGH (ref 0.61–1.24)
GFR calc Af Amer: 10 mL/min — ABNORMAL LOW (ref >60)
GFR calc non Af Amer: 8 mL/min — ABNORMAL LOW (ref >60)
Glucose, Bld: 91 mg/dL (ref 70–99)
Potassium: 5.2 mmol/L — ABNORMAL HIGH (ref 3.5–5.1)
Sodium: 138 mmol/L (ref 135–145)
Total Bilirubin: 0.8 mg/dL (ref 0.3–1.2)
Total Protein: 6 g/dL — ABNORMAL LOW (ref 6.5–8.1)

## 2019-08-30 LAB — CBC WITH DIFFERENTIAL/PLATELET
Abs Immature Granulocytes: 0.04 10*3/uL (ref 0.00–0.07)
Basophils Absolute: 0 10*3/uL (ref 0.0–0.1)
Basophils Relative: 1 %
Eosinophils Absolute: 0.8 10*3/uL — ABNORMAL HIGH (ref 0.0–0.5)
Eosinophils Relative: 15 %
HCT: 31.7 % — ABNORMAL LOW (ref 39.0–52.0)
Hemoglobin: 10.6 g/dL — ABNORMAL LOW (ref 13.0–17.0)
Immature Granulocytes: 1 %
Lymphocytes Relative: 13 %
Lymphs Abs: 0.7 10*3/uL (ref 0.7–4.0)
MCH: 40 pg — ABNORMAL HIGH (ref 26.0–34.0)
MCHC: 33.4 g/dL (ref 30.0–36.0)
MCV: 119.6 fL — ABNORMAL HIGH (ref 80.0–100.0)
Monocytes Absolute: 0.7 10*3/uL (ref 0.1–1.0)
Monocytes Relative: 14 %
Neutro Abs: 2.9 10*3/uL (ref 1.7–7.7)
Neutrophils Relative %: 56 %
Platelets: 163 10*3/uL (ref 150–400)
RBC: 2.65 MIL/uL — ABNORMAL LOW (ref 4.22–5.81)
RDW: 15.9 % — ABNORMAL HIGH (ref 11.5–15.5)
WBC: 5.1 10*3/uL (ref 4.0–10.5)
nRBC: 0 % (ref 0.0–0.2)

## 2019-08-30 LAB — LACTATE DEHYDROGENASE: LDH: 152 U/L (ref 98–192)

## 2019-08-30 LAB — MAGNESIUM: Magnesium: 2.1 mg/dL (ref 1.7–2.4)

## 2019-08-30 MED ORDER — SODIUM CHLORIDE 0.9 % IV SOLN: Freq: Once | INTRAVENOUS | Status: AC

## 2019-08-30 MED ORDER — SODIUM CHLORIDE 0.9% FLUSH
10.0000 mL | INTRAVENOUS | Status: DC | PRN
  Administered 2019-08-30: 10 mL

## 2019-08-30 MED ORDER — HEPARIN SOD (PORK) LOCK FLUSH 100 UNIT/ML IV SOLN
500.0000 [IU] | Freq: Once | INTRAVENOUS | Status: AC | PRN
  Administered 2019-08-30: 500 [IU]

## 2019-08-30 MED ORDER — ONDANSETRON HCL 4 MG/2ML IJ SOLN
4.0000 mg | Freq: Once | INTRAMUSCULAR | Status: AC
  Administered 2019-08-30: 4 mg via INTRAVENOUS

## 2019-08-30 MED ORDER — DEXTROSE 5 % IV SOLN
27.0000 mg/m2 | Freq: Once | INTRAVENOUS | Status: AC
  Administered 2019-08-30: 56 mg via INTRAVENOUS
  Filled 2019-08-30: qty 28

## 2019-08-30 MED ORDER — SODIUM CHLORIDE 0.9 % IV SOLN
20.0000 mg | Freq: Once | INTRAVENOUS | Status: AC
  Administered 2019-08-30: 20 mg via INTRAVENOUS
  Filled 2019-08-30: qty 20

## 2019-08-30 MED ORDER — ONDANSETRON HCL 4 MG/2ML IJ SOLN
INTRAMUSCULAR | Status: AC
  Filled 2019-08-30: qty 2

## 2019-08-30 NOTE — Patient Instructions (Signed)
[facility] Discharge Instructions for Patients Receiving Chemotherapy  Today you received the following chemotherapy agents   To help prevent nausea and vomiting after your treatment, we encourage you to take your nausea medication   If you develop nausea and vomiting that is not controlled by your nausea medication, call the clinic.   BELOW ARE SYMPTOMS THAT SHOULD BE REPORTED IMMEDIATELY:  *FEVER GREATER THAN 100.5 F  *CHILLS WITH OR WITHOUT FEVER  NAUSEA AND VOMITING THAT IS NOT CONTROLLED WITH YOUR NAUSEA MEDICATION  *UNUSUAL SHORTNESS OF BREATH  *UNUSUAL BRUISING OR BLEEDING  TENDERNESS IN MOUTH AND THROAT WITH OR WITHOUT PRESENCE OF ULCERS  *URINARY PROBLEMS  *BOWEL PROBLEMS  UNUSUAL RASH Items with * indicate a potential emergency and should be followed up as soon as possible.  Feel free to call the clinic should you have any questions or concerns. The clinic phone number is [phone].  Please show the CHEMO ALERT CARD at check-in to the Emergency Department and triage nurse.   

## 2019-08-30 NOTE — Progress Notes (Signed)
Patient presents today for treatment. Vital signs within parameters for treatment. MAR reviewed. Labs pending. Patient denies any changes since the last visit. Patient rates pain a 5/10 . Chronic. Back.   Labs reviewed by [facility] NP. Proceed with treatment. Creatinine 6.49. NP aware.   BP recheck 125/63  Per RLockamy NP hold Retacrit today. Will give injection next week with treatment if needed. HGB 10.6 today.   Treatment given today per MD orders. Tolerated infusion without adverse affects. Vital signs stable. No complaints at this time. Discharged from clinic ambulatory. F/U with [facility] as scheduled.

## 2019-08-31 ENCOUNTER — Encounter (HOSPITAL_COMMUNITY): Payer: Self-pay

## 2019-08-31 ENCOUNTER — Inpatient Hospital Stay (HOSPITAL_COMMUNITY): Payer: Medicare Other

## 2019-08-31 VITALS — BP 146/85 | HR 51 | Temp 97.3°F | Resp 18

## 2019-08-31 DIAGNOSIS — Z5112 Encounter for antineoplastic immunotherapy: Secondary | ICD-10-CM | POA: Diagnosis not present

## 2019-08-31 DIAGNOSIS — C9000 Multiple myeloma not having achieved remission: Secondary | ICD-10-CM

## 2019-08-31 LAB — PROTEIN ELECTROPHORESIS, SERUM
A/G Ratio: 1.6 (ref 0.7–1.7)
Albumin ELP: 2.6 g/dL — ABNORMAL LOW (ref 2.9–4.4)
Alpha-1-Globulin: 0.1 g/dL (ref 0.0–0.4)
Alpha-2-Globulin: 0.5 g/dL (ref 0.4–1.0)
Beta Globulin: 0.6 g/dL — ABNORMAL LOW (ref 0.7–1.3)
Gamma Globulin: 0.4 g/dL (ref 0.4–1.8)
Globulin, Total: 1.6 g/dL — ABNORMAL LOW (ref 2.2–3.9)
M-Spike, %: 0.1 g/dL — ABNORMAL HIGH
Total Protein ELP: 4.2 g/dL — ABNORMAL LOW (ref 6.0–8.5)

## 2019-08-31 LAB — KAPPA/LAMBDA LIGHT CHAINS
Kappa free light chain: 2635.4 mg/L — ABNORMAL HIGH (ref 3.3–19.4)
Kappa, lambda light chain ratio: 142.45 — ABNORMAL HIGH (ref 0.26–1.65)
Lambda free light chains: 18.5 mg/L (ref 5.7–26.3)

## 2019-08-31 MED ORDER — ONDANSETRON HCL 4 MG/2ML IJ SOLN
4.0000 mg | Freq: Once | INTRAMUSCULAR | Status: AC
  Administered 2019-08-31: 4 mg via INTRAVENOUS

## 2019-08-31 MED ORDER — SODIUM CHLORIDE 0.9 % IV SOLN: Freq: Once | INTRAVENOUS | Status: AC

## 2019-08-31 MED ORDER — SODIUM CHLORIDE 0.9% FLUSH
10.0000 mL | INTRAVENOUS | Status: DC | PRN
  Administered 2019-08-31: 10 mL

## 2019-08-31 MED ORDER — DEXTROSE 5 % IV SOLN
27.0000 mg/m2 | Freq: Once | INTRAVENOUS | Status: AC
  Administered 2019-08-31: 56 mg via INTRAVENOUS
  Filled 2019-08-31: qty 28

## 2019-08-31 MED ORDER — ONDANSETRON HCL 4 MG/2ML IJ SOLN
INTRAMUSCULAR | Status: AC
  Filled 2019-08-31: qty 2

## 2019-08-31 MED ORDER — HEPARIN SOD (PORK) LOCK FLUSH 100 UNIT/ML IV SOLN
500.0000 [IU] | Freq: Once | INTRAVENOUS | Status: AC | PRN
  Administered 2019-08-31: 500 [IU]

## 2019-08-31 NOTE — Patient Instructions (Signed)
[facility] Discharge Instructions for Patients Receiving Chemotherapy  Today you received the following chemotherapy agents   To help prevent nausea and vomiting after your treatment, we encourage you to take your nausea medication   If you develop nausea and vomiting that is not controlled by your nausea medication, call the clinic.   BELOW ARE SYMPTOMS THAT SHOULD BE REPORTED IMMEDIATELY:  *FEVER GREATER THAN 100.5 F  *CHILLS WITH OR WITHOUT FEVER  NAUSEA AND VOMITING THAT IS NOT CONTROLLED WITH YOUR NAUSEA MEDICATION  *UNUSUAL SHORTNESS OF BREATH  *UNUSUAL BRUISING OR BLEEDING  TENDERNESS IN MOUTH AND THROAT WITH OR WITHOUT PRESENCE OF ULCERS  *URINARY PROBLEMS  *BOWEL PROBLEMS  UNUSUAL RASH Items with * indicate a potential emergency and should be followed up as soon as possible.  Feel free to call the clinic should you have any questions or concerns. The clinic phone number is [phone].  Please show the CHEMO ALERT CARD at check-in to the Emergency Department and triage nurse.   

## 2019-08-31 NOTE — Progress Notes (Signed)
Patient presents today for Kyprolis. Vital signs within parameters for treatment. Patient has no complaints of any changes since his last visit.   Treatment given today per MD orders. Tolerated infusion without adverse affects. Vital signs stable. No complaints at this time. Discharged from clinic ambulatory. F/U with [facility] as scheduled.

## 2019-09-06 ENCOUNTER — Other Ambulatory Visit: Payer: Self-pay

## 2019-09-06 ENCOUNTER — Inpatient Hospital Stay (HOSPITAL_COMMUNITY): Payer: Medicare Other

## 2019-09-06 VITALS — BP 125/63 | HR 72 | Temp 97.7°F | Resp 18 | Wt 163.2 lb

## 2019-09-06 DIAGNOSIS — N185 Chronic kidney disease, stage 5: Secondary | ICD-10-CM

## 2019-09-06 DIAGNOSIS — Z5112 Encounter for antineoplastic immunotherapy: Secondary | ICD-10-CM | POA: Diagnosis not present

## 2019-09-06 DIAGNOSIS — C9000 Multiple myeloma not having achieved remission: Secondary | ICD-10-CM

## 2019-09-06 LAB — CBC WITH DIFFERENTIAL/PLATELET
Abs Immature Granulocytes: 0.03 10*3/uL (ref 0.00–0.07)
Basophils Absolute: 0 10*3/uL (ref 0.0–0.1)
Basophils Relative: 1 %
Eosinophils Absolute: 0.6 10*3/uL — ABNORMAL HIGH (ref 0.0–0.5)
Eosinophils Relative: 13 %
HCT: 30.4 % — ABNORMAL LOW (ref 39.0–52.0)
Hemoglobin: 10 g/dL — ABNORMAL LOW (ref 13.0–17.0)
Immature Granulocytes: 1 %
Lymphocytes Relative: 13 %
Lymphs Abs: 0.6 10*3/uL — ABNORMAL LOW (ref 0.7–4.0)
MCH: 38.6 pg — ABNORMAL HIGH (ref 26.0–34.0)
MCHC: 32.9 g/dL (ref 30.0–36.0)
MCV: 117.4 fL — ABNORMAL HIGH (ref 80.0–100.0)
Monocytes Absolute: 1.2 10*3/uL — ABNORMAL HIGH (ref 0.1–1.0)
Monocytes Relative: 26 %
Neutro Abs: 2 10*3/uL (ref 1.7–7.7)
Neutrophils Relative %: 46 %
Platelets: 100 10*3/uL — ABNORMAL LOW (ref 150–400)
RBC: 2.59 MIL/uL — ABNORMAL LOW (ref 4.22–5.81)
RDW: 15.7 % — ABNORMAL HIGH (ref 11.5–15.5)
WBC: 4.4 10*3/uL (ref 4.0–10.5)
nRBC: 0 % (ref 0.0–0.2)

## 2019-09-06 LAB — COMPREHENSIVE METABOLIC PANEL
ALT: 15 U/L (ref 0–44)
AST: 14 U/L — ABNORMAL LOW (ref 15–41)
Albumin: 3.6 g/dL (ref 3.5–5.0)
Alkaline Phosphatase: 59 U/L (ref 38–126)
Anion gap: 10 (ref 5–15)
BUN: 68 mg/dL — ABNORMAL HIGH (ref 6–20)
CO2: 21 mmol/L — ABNORMAL LOW (ref 22–32)
Calcium: 8.8 mg/dL — ABNORMAL LOW (ref 8.9–10.3)
Chloride: 106 mmol/L (ref 98–111)
Creatinine, Ser: 6.37 mg/dL — ABNORMAL HIGH (ref 0.61–1.24)
GFR calc Af Amer: 10 mL/min — ABNORMAL LOW (ref >60)
GFR calc non Af Amer: 9 mL/min — ABNORMAL LOW (ref >60)
Glucose, Bld: 96 mg/dL (ref 70–99)
Potassium: 4.9 mmol/L (ref 3.5–5.1)
Sodium: 137 mmol/L (ref 135–145)
Total Bilirubin: 0.6 mg/dL (ref 0.3–1.2)
Total Protein: 5.9 g/dL — ABNORMAL LOW (ref 6.5–8.1)

## 2019-09-06 LAB — MAGNESIUM: Magnesium: 2.1 mg/dL (ref 1.7–2.4)

## 2019-09-06 MED ORDER — SODIUM CHLORIDE 0.9 % IV SOLN: Freq: Once | INTRAVENOUS | Status: AC

## 2019-09-06 MED ORDER — EPOETIN ALFA-EPBX 10000 UNIT/ML IJ SOLN
20000.0000 [IU] | Freq: Once | INTRAMUSCULAR | Status: AC
  Administered 2019-09-06: 20000 [IU] via SUBCUTANEOUS
  Filled 2019-09-06: qty 2

## 2019-09-06 MED ORDER — SODIUM CHLORIDE 0.9 % IV SOLN
20.0000 mg | Freq: Once | INTRAVENOUS | Status: AC
  Administered 2019-09-06: 20 mg via INTRAVENOUS
  Filled 2019-09-06: qty 20

## 2019-09-06 MED ORDER — DEXTROSE 5 % IV SOLN
27.0000 mg/m2 | Freq: Once | INTRAVENOUS | Status: AC
  Administered 2019-09-06: 56 mg via INTRAVENOUS
  Filled 2019-09-06: qty 28

## 2019-09-06 MED ORDER — SODIUM CHLORIDE 0.9% FLUSH
10.0000 mL | INTRAVENOUS | Status: DC | PRN
  Administered 2019-09-06: 10 mL

## 2019-09-06 MED ORDER — ONDANSETRON HCL 4 MG/2ML IJ SOLN
4.0000 mg | Freq: Once | INTRAMUSCULAR | Status: AC
  Administered 2019-09-06: 4 mg via INTRAVENOUS
  Filled 2019-09-06: qty 2

## 2019-09-06 MED ORDER — HEPARIN SOD (PORK) LOCK FLUSH 100 UNIT/ML IV SOLN
500.0000 [IU] | Freq: Once | INTRAVENOUS | Status: AC | PRN
  Administered 2019-09-06: 500 [IU]

## 2019-09-06 NOTE — Progress Notes (Signed)
[name] presents today for D15C5 Kyprolis. Pt denies any changes or new symptoms since last weeks treatment. Vitals and lab results reviewed. Cr of 6.37 noted, down from 6.49 last week. Hgb 10. Reviewed with Francene Finders, NP. Per NP, ok to proceed with treatment and Retacrit today.  Infusions tolerated without incident or complaint. VSS upon completion. Port flushed and deaccessed. Discharged in satisfactory condition with follow up instructions.

## 2019-09-06 NOTE — Patient Instructions (Signed)
[facility] Discharge Instructions  Labs drawn from portacath today   Thank you for choosing [facility] at [facility] to provide your oncology and hematology care.  To afford each patient quality time with our provider, please arrive at least 15 minutes before your scheduled appointment time.   If you have a lab appointment with the Cancer Center please come in thru the Main Entrance and check in at the main information desk.  You need to re-schedule your appointment should you arrive 10 or more minutes late.  We strive to give you quality time with our providers, and arriving late affects you and other patients whose appointments are after yours.  Also, if you no show three or more times for appointments you may be dismissed from the clinic at the providers discretion.     Again, thank you for choosing [facility].  Our hope is that these requests will decrease the amount of time that you wait before being seen by our physicians.       _____________________________________________________________  Should you have questions after your visit to  Cancer Center, please contact our office at [phone] between the hours of 8:00 a.m. and 4:30 p.m.  Voicemails left after 4:00 p.m. will not be returned until the following business day.  For prescription refill requests, have your pharmacy contact our office and allow 72 hours.    Due to Covid, you will need to wear a mask upon entering the hospital. If you do not have a mask, a mask will be given to you at the Main Entrance upon arrival. For doctor visits, patients may have 1 support person with them. For treatment visits, patients can not have anyone with them due to social distancing guidelines and our immunocompromised population.     

## 2019-09-06 NOTE — Patient Instructions (Signed)
[facility] Discharge Instructions for Patients Receiving Chemotherapy   Beginning January 23rd 2017 lab work for the Cancer Center will be done in the  Main lab at [place] on 1st floor. If you have a lab appointment with the Cancer Center please come in thru the  Main Entrance and check in at the main information desk   Today you received the following chemotherapy agents Kyprolis  To help prevent nausea and vomiting after your treatment, we encourage you to take your nausea medication If you develop nausea and vomiting, or diarrhea that is not controlled by your medication, call the clinic.  The clinic phone number is [phone]. Office hours are Monday-Friday 8:30am-5:00pm.  BELOW ARE SYMPTOMS THAT SHOULD BE REPORTED IMMEDIATELY:  *FEVER GREATER THAN 101.0 F  *CHILLS WITH OR WITHOUT FEVER  NAUSEA AND VOMITING THAT IS NOT CONTROLLED WITH YOUR NAUSEA MEDICATION  *UNUSUAL SHORTNESS OF BREATH  *UNUSUAL BRUISING OR BLEEDING  TENDERNESS IN MOUTH AND THROAT WITH OR WITHOUT PRESENCE OF ULCERS  *URINARY PROBLEMS  *BOWEL PROBLEMS  UNUSUAL RASH Items with * indicate a potential emergency and should be followed up as soon as possible. If you have an emergency after office hours please contact your primary care physician or go to the nearest emergency department.  Please call the clinic during office hours if you have any questions or concerns.   You may also contact the Patient Navigator at [phone] should you have any questions or need assistance in obtaining follow up care.      Resources For Cancer Patients and their Caregivers ? American Cancer Society: Can assist with transportation, wigs, general needs, runs Look Good Feel Better.        1-888-227-6333 ? Cancer Care: Provides financial assistance, online support groups, medication/co-pay assistance.  1-800-813-HOPE (4673) ? [facility] Assists [place] cancer  patients and their families through emotional , educational and financial support.  336-427-4357 ? [place] DSS Where to apply for food stamps, Medicaid and utility assistance. 336-342-1394 ? [facility]: Transportation to medical appointments. 336-347-2287 ? Social Security Administration: May apply for disability if have a Stage IV cancer. 336-342-7796 1-800-772-1213 ? [place] Aging, Disability and Transit Services: Assists with nutrition, care and transit needs. 336-349-2343          

## 2019-09-07 ENCOUNTER — Other Ambulatory Visit (HOSPITAL_COMMUNITY): Payer: Self-pay | Admitting: *Deleted

## 2019-09-07 ENCOUNTER — Inpatient Hospital Stay (HOSPITAL_COMMUNITY): Payer: Medicare Other

## 2019-09-07 VITALS — BP 132/48 | HR 66 | Temp 97.7°F | Resp 18

## 2019-09-07 DIAGNOSIS — Z5112 Encounter for antineoplastic immunotherapy: Secondary | ICD-10-CM | POA: Diagnosis not present

## 2019-09-07 DIAGNOSIS — C9000 Multiple myeloma not having achieved remission: Secondary | ICD-10-CM

## 2019-09-07 MED ORDER — SODIUM CHLORIDE 0.9 % IV SOLN: Freq: Once | INTRAVENOUS | Status: AC

## 2019-09-07 MED ORDER — DEXTROSE 5 % IV SOLN
27.0000 mg/m2 | Freq: Once | INTRAVENOUS | Status: AC
  Administered 2019-09-07: 56 mg via INTRAVENOUS
  Filled 2019-09-07: qty 28

## 2019-09-07 MED ORDER — SODIUM CHLORIDE 0.9% FLUSH
10.0000 mL | INTRAVENOUS | Status: DC | PRN
  Administered 2019-09-07: 10 mL

## 2019-09-07 MED ORDER — POMALIDOMIDE 3 MG PO CAPS: 3.0000 mg | ORAL_CAPSULE | Freq: Every day | ORAL | 0 refills | Status: DC

## 2019-09-07 MED ORDER — ONDANSETRON HCL 4 MG/2ML IJ SOLN
4.0000 mg | Freq: Once | INTRAMUSCULAR | Status: AC
  Administered 2019-09-07: 4 mg via INTRAVENOUS
  Filled 2019-09-07: qty 2

## 2019-09-07 MED ORDER — HEPARIN SOD (PORK) LOCK FLUSH 100 UNIT/ML IV SOLN
500.0000 [IU] | Freq: Once | INTRAVENOUS | Status: AC | PRN
  Administered 2019-09-07: 500 [IU]

## 2019-09-07 NOTE — Progress Notes (Signed)
Treatment given per orders. Patient tolerated it well without problems. Vitals stable and discharged home from clinic ambulatory. Follow up as scheduled.  

## 2019-09-07 NOTE — Patient Instructions (Signed)
[facility] Discharge Instructions for Patients Receiving Chemotherapy  Today you received the following chemotherapy agents   To help prevent nausea and vomiting after your treatment, we encourage you to take your nausea medication   If you develop nausea and vomiting that is not controlled by your nausea medication, call the clinic.   BELOW ARE SYMPTOMS THAT SHOULD BE REPORTED IMMEDIATELY:  *FEVER GREATER THAN 100.5 F  *CHILLS WITH OR WITHOUT FEVER  NAUSEA AND VOMITING THAT IS NOT CONTROLLED WITH YOUR NAUSEA MEDICATION  *UNUSUAL SHORTNESS OF BREATH  *UNUSUAL BRUISING OR BLEEDING  TENDERNESS IN MOUTH AND THROAT WITH OR WITHOUT PRESENCE OF ULCERS  *URINARY PROBLEMS  *BOWEL PROBLEMS  UNUSUAL RASH Items with * indicate a potential emergency and should be followed up as soon as possible.  Feel free to call the clinic should you have any questions or concerns. The clinic phone number is [phone].  Please show the CHEMO ALERT CARD at check-in to the Emergency Department and triage nurse.   

## 2019-09-09 ENCOUNTER — Other Ambulatory Visit (HOSPITAL_COMMUNITY): Payer: Self-pay | Admitting: *Deleted

## 2019-09-09 MED ORDER — HYDROMORPHONE HCL 4 MG PO TABS: 4.0000 mg | ORAL_TABLET | ORAL | 0 refills | Status: DC | PRN

## 2019-09-12 ENCOUNTER — Ambulatory Visit (HOSPITAL_COMMUNITY): Payer: Medicare Other

## 2019-09-19 ENCOUNTER — Encounter (HOSPITAL_COMMUNITY): Payer: Self-pay | Admitting: Oncology

## 2019-09-19 ENCOUNTER — Inpatient Hospital Stay (HOSPITAL_BASED_OUTPATIENT_CLINIC_OR_DEPARTMENT_OTHER): Payer: Medicare Other | Admitting: Oncology

## 2019-09-19 ENCOUNTER — Other Ambulatory Visit: Payer: Self-pay

## 2019-09-19 ENCOUNTER — Inpatient Hospital Stay (HOSPITAL_COMMUNITY): Payer: Medicare Other

## 2019-09-19 VITALS — BP 147/79 | HR 66 | Temp 97.3°F | Resp 18 | Wt 162.6 lb

## 2019-09-19 VITALS — BP 149/78 | HR 86 | Temp 97.2°F | Resp 18

## 2019-09-19 DIAGNOSIS — G9589 Other specified diseases of spinal cord: Secondary | ICD-10-CM

## 2019-09-19 DIAGNOSIS — N185 Chronic kidney disease, stage 5: Secondary | ICD-10-CM

## 2019-09-19 DIAGNOSIS — N179 Acute kidney failure, unspecified: Secondary | ICD-10-CM | POA: Diagnosis not present

## 2019-09-19 DIAGNOSIS — Z5112 Encounter for antineoplastic immunotherapy: Secondary | ICD-10-CM | POA: Diagnosis not present

## 2019-09-19 DIAGNOSIS — C7951 Secondary malignant neoplasm of bone: Secondary | ICD-10-CM | POA: Insufficient documentation

## 2019-09-19 DIAGNOSIS — C9000 Multiple myeloma not having achieved remission: Secondary | ICD-10-CM

## 2019-09-19 HISTORY — DX: Secondary malignant neoplasm of bone: C79.51

## 2019-09-19 LAB — COMPREHENSIVE METABOLIC PANEL
ALT: 12 U/L (ref 0–44)
AST: 13 U/L — ABNORMAL LOW (ref 15–41)
Albumin: 3.4 g/dL — ABNORMAL LOW (ref 3.5–5.0)
Alkaline Phosphatase: 60 U/L (ref 38–126)
Anion gap: 9 (ref 5–15)
BUN: 55 mg/dL — ABNORMAL HIGH (ref 8–23)
CO2: 22 mmol/L (ref 22–32)
Calcium: 9.7 mg/dL (ref 8.9–10.3)
Chloride: 106 mmol/L (ref 98–111)
Creatinine, Ser: 6.55 mg/dL — ABNORMAL HIGH (ref 0.61–1.24)
GFR calc Af Amer: 10 mL/min — ABNORMAL LOW (ref >60)
GFR calc non Af Amer: 8 mL/min — ABNORMAL LOW (ref >60)
Glucose, Bld: 102 mg/dL — ABNORMAL HIGH (ref 70–99)
Potassium: 4.6 mmol/L (ref 3.5–5.1)
Sodium: 137 mmol/L (ref 135–145)
Total Bilirubin: 0.5 mg/dL (ref 0.3–1.2)
Total Protein: 5.8 g/dL — ABNORMAL LOW (ref 6.5–8.1)

## 2019-09-19 LAB — CBC WITH DIFFERENTIAL/PLATELET
Abs Immature Granulocytes: 0.01 10*3/uL (ref 0.00–0.07)
Basophils Absolute: 0.1 10*3/uL (ref 0.0–0.1)
Basophils Relative: 2 %
Eosinophils Absolute: 0.4 10*3/uL (ref 0.0–0.5)
Eosinophils Relative: 8 %
HCT: 28.7 % — ABNORMAL LOW (ref 39.0–52.0)
Hemoglobin: 9.5 g/dL — ABNORMAL LOW (ref 13.0–17.0)
Immature Granulocytes: 0 %
Lymphocytes Relative: 19 %
Lymphs Abs: 0.8 10*3/uL (ref 0.7–4.0)
MCH: 39.1 pg — ABNORMAL HIGH (ref 26.0–34.0)
MCHC: 33.1 g/dL (ref 30.0–36.0)
MCV: 118.1 fL — ABNORMAL HIGH (ref 80.0–100.0)
Monocytes Absolute: 1.1 10*3/uL — ABNORMAL HIGH (ref 0.1–1.0)
Monocytes Relative: 26 %
Neutro Abs: 1.9 10*3/uL (ref 1.7–7.7)
Neutrophils Relative %: 45 %
Platelets: 189 10*3/uL (ref 150–400)
RBC: 2.43 MIL/uL — ABNORMAL LOW (ref 4.22–5.81)
RDW: 15.7 % — ABNORMAL HIGH (ref 11.5–15.5)
WBC: 4.2 10*3/uL (ref 4.0–10.5)
nRBC: 0 % (ref 0.0–0.2)

## 2019-09-19 MED ORDER — ACETAMINOPHEN 325 MG PO TABS
ORAL_TABLET | ORAL | Status: AC
  Filled 2019-09-19: qty 1

## 2019-09-19 MED ORDER — EPOETIN ALFA-EPBX 10000 UNIT/ML IJ SOLN
20000.0000 [IU] | Freq: Once | INTRAMUSCULAR | Status: AC
  Administered 2019-09-19: 20000 [IU] via SUBCUTANEOUS
  Filled 2019-09-19: qty 2

## 2019-09-19 MED ORDER — ONDANSETRON HCL 4 MG/2ML IJ SOLN
4.0000 mg | Freq: Once | INTRAMUSCULAR | Status: AC
  Administered 2019-09-19: 4 mg via INTRAVENOUS
  Filled 2019-09-19: qty 2

## 2019-09-19 MED ORDER — DEXTROSE 5 % IV SOLN
27.0000 mg/m2 | Freq: Once | INTRAVENOUS | Status: AC
  Administered 2019-09-19: 56 mg via INTRAVENOUS
  Filled 2019-09-19: qty 28

## 2019-09-19 MED ORDER — DENOSUMAB 120 MG/1.7ML ~~LOC~~ SOLN
120.0000 mg | Freq: Once | SUBCUTANEOUS | Status: AC
  Administered 2019-09-19: 120 mg via SUBCUTANEOUS
  Filled 2019-09-19: qty 1.7

## 2019-09-19 MED ORDER — SODIUM CHLORIDE 0.9 % IV SOLN: Freq: Once | INTRAVENOUS | Status: AC

## 2019-09-19 MED ORDER — SODIUM CHLORIDE 0.9 % IV SOLN
20.0000 mg | Freq: Once | INTRAVENOUS | Status: AC
  Administered 2019-09-19: 20 mg via INTRAVENOUS
  Filled 2019-09-19: qty 20

## 2019-09-19 MED ORDER — HEPARIN SOD (PORK) LOCK FLUSH 100 UNIT/ML IV SOLN
500.0000 [IU] | Freq: Once | INTRAVENOUS | Status: AC | PRN
  Administered 2019-09-19: 500 [IU]

## 2019-09-19 MED ORDER — SODIUM CHLORIDE 0.9% FLUSH
10.0000 mL | INTRAVENOUS | Status: DC | PRN
  Administered 2019-09-19: 10 mL

## 2019-09-19 NOTE — Progress Notes (Signed)
t       Rosita Fire, MD [address] [place] [address]  Multiple myeloma not having achieved remission ([place])  Kappa light chain myeloma ([place])  CKD (chronic kidney disease), stage V ([place])  AKI (acute kidney injury) ([place])  Lumbar epidural mass ([place])  Bone metastases ([place])   HISTORY OF PRESENT ILLNESS: Kappa light chain myeloma, high risk, stage III: -CyBorD from 06/17/2018 -04/04/2019 with progression. -He was evaluated for bone marrow transplant at [facility] but has refused it. -Carfilzomib, pomalidomide and dexamethasone from 04/21/2019. - Ventral epidural tumor involving L-spine noted on MRI imaging on 04/20/2019, treated with palliative XRT (05/22/2019- 05/27/2019).   CURRENT STATUS: [name] 59 y.o. male returns for followup of in follow-up of MM, currently on therapy as described above with palliative intent.  He is tolerating therapy without reported complicated.  No nausea or vomiting.  He denies any new pain but reports ongoing mid back pain that is stable without exacerbation.  On Friday he developed a headache that resolved by Saturday.  No headache today.  He is producing urine without complication.  No recent infections or antibiotic therapy.  No recent hospitalizations.  No new B symptoms.  Review of Systems  Constitutional: Negative.  Negative for chills, fever and weight loss.  HENT: Negative.   Eyes: Negative.   Respiratory: Negative.  Negative for cough.   Cardiovascular: Negative.  Negative for chest pain.  Gastrointestinal: Negative.  Negative for blood in stool, constipation, diarrhea, melena, nausea and vomiting.  Genitourinary: Negative.   Musculoskeletal: Negative.   Skin: Negative.   Neurological: Positive for headaches (Friday- resolved by Saturday.). Negative for weakness.  Endo/Heme/Allergies: Negative.   Psychiatric/Behavioral: Negative.     Past Medical History:  Diagnosis Date  . Allergy   . Arthritis     neck and back  . Blood transfusion without reported diagnosis   . Bone metastases ([place]) 09/19/2019  . BPH (benign prostatic hyperplasia)   . Cancer [facility]) 2004   testicle  . Chronic kidney disease    kidney stones  . Left lumbar radiculopathy 06/12/2016  . 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 Medical history non-contributory    Pt has scattered thoughts and uncertain of past medical history  . 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 Stones, urinary tract   . Substance abuse ([place])    prescribed oxydcodone- 10 years    Past Surgical History:  Procedure Laterality Date  . BACK SURGERY     x5  . CERVICAL DISC SURGERY     x2  . COLONOSCOPY WITH PROPOFOL N/A 08/11/2016   Dr. Gala Romney: non-bleeding internal hemorrhoids, one 4 mm hyperplastic rectal polyp, diverticulosis in entire examined colon  . POLYPECTOMY  08/11/2016   Procedure: POLYPECTOMY;  Surgeon: [name], MD;  Location: [facility] ENDO SUITE;  Service: Endoscopy;;  colon  . PORTACATH PLACEMENT Left 09/20/2018   Procedure: INSERTION PORT-A-CATH (catheter attached left subclavian);  Surgeon: Virl Cagey, MD;  Location: [facility] ORS;  Service: General;  Laterality: Left;  . testicular cancer  2004  . TONSILLECTOMY      Family History  Problem Relation Age of Onset  . COPD Mother   . Heart disease Mother   . Other Father        Never knew his father.  . Thyroid disease Sister   . Arthritis Sister   . Heart disease Sister        bypass  . Colon cancer Neg Hx  Social History   Socioeconomic History  . Marital status: Single    Spouse name: Not on file  . Number of children: 1  . Years of education: 56  . Highest education level: Not on file  Occupational History  . Occupation: retired    Comment: Psychologist, prison and probation services  . Occupation: disabled  Tobacco Use  . Smoking status: Current Every Day Smoker    Packs/day: 1.00    Years: 40.00    Pack years: 40.00    Types: Cigarettes    Start date: 03/31/1974  . Smokeless tobacco: Never  Used  Vaping Use  . Vaping Use: Never used  Substance and Sexual Activity  . Alcohol use: Yes    Comment: Occasional  . Drug use: No    Types: Cocaine    Comment: Remote hx of cocaine, quit 1989  . Sexual activity: Yes  Other Topics Concern  . Not on file  Social History Narrative   [employer] for 12 years   [employer] for 12 years      Never married   One daughter      Lives alone   Retail banker   Right-handed   Occasional caffeine use         Social Determinants of Health   Financial Resource Strain:   . Difficulty of Paying Living Expenses:   Food Insecurity:   . Worried About Charity fundraiser in the Last Year:   . Arboriculturist in the Last Year:   Transportation Needs:   . Film/video editor (Medical):   Marland Kitchen Lack of Transportation (Non-Medical):   Physical Activity:   . Days of Exercise per Week:   . Minutes of Exercise per Session:   Stress:   . Feeling of Stress :   Social Connections:   . Frequency of Communication with Friends and Family:   . Frequency of Social Gatherings with Friends and Family:   . Attends Religious Services:   . Active Member of Clubs or Organizations:   . Attends Archivist Meetings:   Marland Kitchen Marital Status:      PHYSICAL EXAMINATION  ECOG PERFORMANCE STATUS: 1 - Symptomatic but completely ambulatory  Vitals:   09/19/19 0906  BP: (!) 147/79  Pulse: 66  Resp: 18  Temp: (!) 97.3 F (36.3 C)  SpO2: 100%    GENERAL:alert, no distress, comfortable and cooperative SKIN: skin color, texture, turgor are normal, no rashes or significant lesions HEAD: Normocephalic EYES: normal EARS: External ears normal OROPHARYNX:not examined- mask in place  NECK: supple LYMPH:  no palpable lymphadenopathy BREAST:not examined LUNGS: clear to auscultation  HEART: regular rate & rhythm ABDOMEN:abdomen soft and normal bowel sounds BACK: Back symmetric, no curvature. EXTREMITIES:less then 2 second capillary  refill, no edema  NEURO: alert & oriented x 3 with fluent speech   LABORATORY DATA: CBC    Component Value Date/Time   WBC 4.2 09/19/2019 0933   RBC 2.43 (L) 09/19/2019 0933   HGB 9.5 (L) 09/19/2019 0933   HCT 28.7 (L) 09/19/2019 0933   PLT 189 09/19/2019 0933   MCV 118.1 (H) 09/19/2019 0933   MCH 39.1 (H) 09/19/2019 0933   MCHC 33.1 09/19/2019 0933   RDW 15.7 (H) 09/19/2019 0933   LYMPHSABS 0.8 09/19/2019 0933   MONOABS 1.1 (H) 09/19/2019 0933   EOSABS 0.4 09/19/2019 0933   BASOSABS 0.1 09/19/2019 0933      Chemistry      Component Value  Date/Time   NA 137 09/19/2019 0933   K 4.6 09/19/2019 0933   CL 106 09/19/2019 0933   CO2 22 09/19/2019 0933   BUN 55 (H) 09/19/2019 0933   CREATININE 6.55 (H) 09/19/2019 0933   CREATININE 1.07 05/28/2017 1024      Component Value Date/Time   CALCIUM 9.7 09/19/2019 0933   CALCIUM 12.3 (H) 06/12/2018 2240   ALKPHOS 60 09/19/2019 0933   AST 13 (L) 09/19/2019 0933   ALT 12 09/19/2019 0933   BILITOT 0.5 09/19/2019 0933       RADIOGRAPHIC STUDIES:  No results found.   PATHOLOGY:    ASSESSMENT AND PLAN:  1. Multiple myeloma not having achieved remission ([place]) 1.  Kappa light chain myeloma, high risk, stage III: -CyBorD from 06/17/2018 through 04/04/2019 with progression. -He was evaluated for bone marrow transplant at [facility] but has refused it. -Carfilzomib, pomalidomide and dexamethasone from 04/21/2019. - Ventral epidural tumor involving L-spine noted on MRI imaging on 04/20/2019, treated with palliative XRT (05/22/2019- 05/27/2019).  Labs updated today and labs satisfy treatment parameters.  Today is D1C6.  Nursing, in accordance with chemotherapy administration protocol, will monitor for acute side effects/toxicities associated with chemotherapy administration today.  All previous dictations and images reviewed.  Return in 4 weeks for follow-up and ongoing treatment.  2. Kappa light chain myeloma ([place]) As  described above.  3. CKD (chronic kidney disease), stage V ([facility]) On ESA support with retacrit.  Retacrit administered today for Hgb 9.5 g/dL.  He will return weekly for labs and retacrit over the next 4 weeks.    4. AKI (acute kidney injury) ([place]) Stable today.  5. Lumbar epidural mass ([facility]) S/P XRT in 05/2019  6. Bone metastases ([place]) On bone targeted therapy with Xgeva monthly.  There are no diagnoses linked to this encounter.  ORDERS PLACED FOR THIS ENCOUNTER: No orders of the defined types were placed in this encounter.   MEDICATIONS PRESCRIBED THIS ENCOUNTER: No orders of the defined types were placed in this encounter.   All questions were answered. The patient knows to call the clinic with any problems, questions or concerns. We can certainly see the patient much sooner if necessary.  Patient and plan discussed with Dr. Derek Jack and she is in agreement with the aforementioned.   This note is electronically signed by: Robynn Pane, PA-C 09/19/2019 10:09 AM

## 2019-09-19 NOTE — Patient Instructions (Signed)
[facility] at [facility] Discharge Instructions  You were seen today by Tom Kefalas PA. He went over your recent lab results. He will see you back in 4 weeks for labs, treatment and follow up.   Thank you for choosing [facility] at [facility] to provide your oncology and hematology care.  To afford each patient quality time with our provider, please arrive at least 15 minutes before your scheduled appointment time.   If you have a lab appointment with the Cancer Center please come in thru the  Main Entrance and check in at the main information desk  You need to re-schedule your appointment should you arrive 10 or more minutes late.  We strive to give you quality time with our providers, and arriving late affects you and other patients whose appointments are after yours.  Also, if you no show three or more times for appointments you may be dismissed from the clinic at the providers discretion.     Again, thank you for choosing [facility].  Our hope is that these requests will decrease the amount of time that you wait before being seen by our physicians.       _____________________________________________________________  Should you have questions after your visit to [facility], please contact our office at [phone] between the hours of 8:00 a.m. and 4:30 p.m.  Voicemails left after 4:00 p.m. will not be returned until the following business day.  For prescription refill requests, have your pharmacy contact our office and allow 72 hours.    Cancer Center Support Programs:   > Cancer Support Group  2nd Tuesday of the month 1pm-2pm, Journey Room    

## 2019-09-19 NOTE — Progress Notes (Signed)
Serum creat 6.55 with verbal order ok to treat Kirby Crigler, PA.    Patient tolerated chemotherapy with no complaints voiced.  Side effects with management reviewed with understanding verbalized.  Port site clean and dry with no bruising or swelling noted at site.  Good blood return noted before and after administration of chemotherapy.  Band aid applied.  Patient left ambulatory with VSS and no s/s of distress noted.

## 2019-09-20 ENCOUNTER — Inpatient Hospital Stay (HOSPITAL_COMMUNITY): Payer: Medicare Other

## 2019-09-20 ENCOUNTER — Ambulatory Visit (HOSPITAL_COMMUNITY): Payer: Medicare Other

## 2019-09-20 ENCOUNTER — Encounter (HOSPITAL_COMMUNITY): Payer: Self-pay

## 2019-09-20 VITALS — BP 161/80 | HR 81 | Temp 97.7°F | Resp 18

## 2019-09-20 DIAGNOSIS — C9000 Multiple myeloma not having achieved remission: Secondary | ICD-10-CM

## 2019-09-20 DIAGNOSIS — Z5112 Encounter for antineoplastic immunotherapy: Secondary | ICD-10-CM | POA: Diagnosis not present

## 2019-09-20 MED ORDER — SODIUM CHLORIDE 0.9% FLUSH
10.0000 mL | INTRAVENOUS | Status: DC | PRN
  Administered 2019-09-20: 10 mL

## 2019-09-20 MED ORDER — ONDANSETRON HCL 4 MG/2ML IJ SOLN
4.0000 mg | Freq: Once | INTRAMUSCULAR | Status: AC
  Administered 2019-09-20: 4 mg via INTRAVENOUS

## 2019-09-20 MED ORDER — SODIUM CHLORIDE 0.9 % IV SOLN: Freq: Once | INTRAVENOUS | Status: AC

## 2019-09-20 MED ORDER — DEXTROSE 5 % IV SOLN
27.0000 mg/m2 | Freq: Once | INTRAVENOUS | Status: AC
  Administered 2019-09-20: 56 mg via INTRAVENOUS
  Filled 2019-09-20: qty 28

## 2019-09-20 MED ORDER — HEPARIN SOD (PORK) LOCK FLUSH 100 UNIT/ML IV SOLN
500.0000 [IU] | Freq: Once | INTRAVENOUS | Status: AC | PRN
  Administered 2019-09-20: 500 [IU]

## 2019-09-20 MED ORDER — ONDANSETRON HCL 4 MG/2ML IJ SOLN
INTRAMUSCULAR | Status: AC
  Filled 2019-09-20: qty 2

## 2019-09-20 NOTE — Progress Notes (Signed)
Patient taking pomalyst as directed with no complaints voiced.  Only stated fatigue.  No s/s of distress noted.   Patient tolerated chemotherapy with no complaints voiced.  Side effects with management reviewed with understanding verbalized.  Port site clean and dry with no bruising or swelling noted at site.  Good blood return noted before and after administration of chemotherapy.  Band aid applied.  Patient left ambulatory with VSS and no s/s of distress noted.

## 2019-09-26 ENCOUNTER — Inpatient Hospital Stay (HOSPITAL_COMMUNITY): Payer: Medicare Other

## 2019-09-26 ENCOUNTER — Encounter (HOSPITAL_COMMUNITY): Payer: Self-pay

## 2019-09-26 VITALS — BP 154/69 | HR 45 | Temp 97.9°F | Resp 16 | Wt 165.2 lb

## 2019-09-26 DIAGNOSIS — C9000 Multiple myeloma not having achieved remission: Secondary | ICD-10-CM

## 2019-09-26 DIAGNOSIS — N185 Chronic kidney disease, stage 5: Secondary | ICD-10-CM

## 2019-09-26 DIAGNOSIS — Z5112 Encounter for antineoplastic immunotherapy: Secondary | ICD-10-CM | POA: Diagnosis not present

## 2019-09-26 DIAGNOSIS — E875 Hyperkalemia: Secondary | ICD-10-CM

## 2019-09-26 LAB — CBC WITH DIFFERENTIAL/PLATELET
Abs Immature Granulocytes: 0.06 10*3/uL (ref 0.00–0.07)
Basophils Absolute: 0.1 10*3/uL (ref 0.0–0.1)
Basophils Relative: 2 %
Eosinophils Absolute: 0.4 10*3/uL (ref 0.0–0.5)
Eosinophils Relative: 10 %
HCT: 29 % — ABNORMAL LOW (ref 39.0–52.0)
Hemoglobin: 9.4 g/dL — ABNORMAL LOW (ref 13.0–17.0)
Immature Granulocytes: 2 %
Lymphocytes Relative: 12 %
Lymphs Abs: 0.5 10*3/uL — ABNORMAL LOW (ref 0.7–4.0)
MCH: 38.8 pg — ABNORMAL HIGH (ref 26.0–34.0)
MCHC: 32.4 g/dL (ref 30.0–36.0)
MCV: 119.8 fL — ABNORMAL HIGH (ref 80.0–100.0)
Monocytes Absolute: 0.8 10*3/uL (ref 0.1–1.0)
Monocytes Relative: 20 %
Neutro Abs: 2.2 10*3/uL (ref 1.7–7.7)
Neutrophils Relative %: 54 %
Platelets: 110 10*3/uL — ABNORMAL LOW (ref 150–400)
RBC: 2.42 MIL/uL — ABNORMAL LOW (ref 4.22–5.81)
RDW: 15.9 % — ABNORMAL HIGH (ref 11.5–15.5)
WBC: 4 10*3/uL (ref 4.0–10.5)
nRBC: 0 % (ref 0.0–0.2)

## 2019-09-26 LAB — COMPREHENSIVE METABOLIC PANEL
ALT: 15 U/L (ref 0–44)
AST: 18 U/L (ref 15–41)
Albumin: 3.5 g/dL (ref 3.5–5.0)
Alkaline Phosphatase: 58 U/L (ref 38–126)
Anion gap: 11 (ref 5–15)
BUN: 58 mg/dL — ABNORMAL HIGH (ref 8–23)
CO2: 22 mmol/L (ref 22–32)
Calcium: 9.5 mg/dL (ref 8.9–10.3)
Chloride: 104 mmol/L (ref 98–111)
Creatinine, Ser: 6.9 mg/dL — ABNORMAL HIGH (ref 0.61–1.24)
GFR calc Af Amer: 9 mL/min — ABNORMAL LOW (ref >60)
GFR calc non Af Amer: 8 mL/min — ABNORMAL LOW (ref >60)
Glucose, Bld: 109 mg/dL — ABNORMAL HIGH (ref 70–99)
Potassium: 5.7 mmol/L — ABNORMAL HIGH (ref 3.5–5.1)
Sodium: 137 mmol/L (ref 135–145)
Total Bilirubin: 0.5 mg/dL (ref 0.3–1.2)
Total Protein: 5.6 g/dL — ABNORMAL LOW (ref 6.5–8.1)

## 2019-09-26 MED ORDER — DEXTROSE 5 % IV SOLN
27.0000 mg/m2 | Freq: Once | INTRAVENOUS | Status: AC
  Administered 2019-09-26: 56 mg via INTRAVENOUS
  Filled 2019-09-26: qty 28

## 2019-09-26 MED ORDER — SODIUM CHLORIDE 0.9 % IV SOLN: Freq: Once | INTRAVENOUS | Status: AC

## 2019-09-26 MED ORDER — SODIUM CHLORIDE 0.9% FLUSH: 10.0000 mL | INTRAVENOUS | Status: DC | PRN

## 2019-09-26 MED ORDER — HEPARIN SOD (PORK) LOCK FLUSH 100 UNIT/ML IV SOLN
500.0000 [IU] | Freq: Once | INTRAVENOUS | Status: AC | PRN
  Administered 2019-09-26: 500 [IU]

## 2019-09-26 MED ORDER — SODIUM CHLORIDE 0.9 % IV SOLN
20.0000 mg | Freq: Once | INTRAVENOUS | Status: AC
  Administered 2019-09-26: 20 mg via INTRAVENOUS
  Filled 2019-09-26: qty 20

## 2019-09-26 MED ORDER — ONDANSETRON HCL 4 MG/2ML IJ SOLN
INTRAMUSCULAR | Status: AC
  Filled 2019-09-26: qty 2

## 2019-09-26 MED ORDER — EPOETIN ALFA-EPBX 10000 UNIT/ML IJ SOLN
INTRAMUSCULAR | Status: AC
  Filled 2019-09-26: qty 2

## 2019-09-26 MED ORDER — SODIUM POLYSTYRENE SULFONATE 15 GM/60ML PO SUSP
60.0000 g | Freq: Once | ORAL | Status: AC
  Administered 2019-09-26: 60 g via ORAL
  Filled 2019-09-26: qty 240

## 2019-09-26 MED ORDER — EPOETIN ALFA-EPBX 10000 UNIT/ML IJ SOLN
20000.0000 [IU] | Freq: Once | INTRAMUSCULAR | Status: AC
  Administered 2019-09-26: 20000 [IU] via SUBCUTANEOUS

## 2019-09-26 MED ORDER — ONDANSETRON HCL 4 MG/2ML IJ SOLN
4.0000 mg | Freq: Once | INTRAMUSCULAR | Status: AC
  Administered 2019-09-26: 4 mg via INTRAVENOUS

## 2019-09-26 NOTE — Patient Instructions (Signed)
[facility] Discharge Instructions for Patients Receiving Chemotherapy  Today you received the following chemotherapy agents   To help prevent nausea and vomiting after your treatment, we encourage you to take your nausea medication   If you develop nausea and vomiting that is not controlled by your nausea medication, call the clinic.   BELOW ARE SYMPTOMS THAT SHOULD BE REPORTED IMMEDIATELY:  *FEVER GREATER THAN 100.5 F  *CHILLS WITH OR WITHOUT FEVER  NAUSEA AND VOMITING THAT IS NOT CONTROLLED WITH YOUR NAUSEA MEDICATION  *UNUSUAL SHORTNESS OF BREATH  *UNUSUAL BRUISING OR BLEEDING  TENDERNESS IN MOUTH AND THROAT WITH OR WITHOUT PRESENCE OF ULCERS  *URINARY PROBLEMS  *BOWEL PROBLEMS  UNUSUAL RASH Items with * indicate a potential emergency and should be followed up as soon as possible.  Feel free to call the clinic should you have any questions or concerns. The clinic phone number is [phone].  Please show the CHEMO ALERT CARD at check-in to the Emergency Department and triage nurse.   

## 2019-09-26 NOTE — Patient Instructions (Signed)
[facility] at [facility] Discharge Instructions  Labs drawn from portacath today   Thank you for choosing  Can[facility] Center at [facility] to provide your oncology and hematology care.  To afford each patient quality time with our provider, please arrive at least 15 minutes before your scheduled appointment time.   If you have a lab appointment with the Cancer Center please come in thru the Main Entrance and check in at the main information desk.  You need to re-schedule your appointment should you arrive 10 or more minutes late.  We strive to give you quality time with our providers, and arriving late affects you and other patients whose appointments are after yours.  Also, if you no show three or more times for appointments you may be dismissed from the clinic at the providers discretion.     Again, thank you for choosing [facility].  Our hope is that these requests will decrease the amount of time that you wait before being seen by our physicians.       _____________________________________________________________  Should you have questions after your visit to [facility], please contact our office at [phone] between the hours of 8:00 a.m. and 4:30 p.m.  Voicemails left after 4:00 p.m. will not be returned until the following business day.  For prescription refill requests, have your pharmacy contact our office and allow 72 hours.    Due to Covid, you will need to wear a mask upon entering the hospital. If you do not have a mask, a mask will be given to you at the Main Entrance upon arrival. For doctor visits, patients may have 1 support person with them. For treatment visits, patients can not have anyone with them due to social distancing guidelines and our immunocompromised population.     

## 2019-09-26 NOTE — Progress Notes (Signed)
Patient presents today for treatment. Labs pending. Vital signs are stable. Patient has no complaints of any changes since his last visit. MAR reviewed and updated. Patient denies any changes since his last visit.   Labs reviewed by [facility] NP. Verbal order received to proceed with treatment. Verbal order received to give patient Kayexalate 60 grams for home at discharge. Potassium 5.7. Creatinine 6.90. NP aware.   Treatment given today per MD orders. Tolerated infusion without adverse affects. Vital signs stable. No complaints at this time. Discharged from clinic ambulatory. F/U with [facility] as scheduled.

## 2019-09-27 ENCOUNTER — Inpatient Hospital Stay (HOSPITAL_COMMUNITY): Payer: Medicare Other

## 2019-09-27 VITALS — BP 164/81 | HR 65 | Temp 98.0°F | Resp 18

## 2019-09-27 DIAGNOSIS — Z5112 Encounter for antineoplastic immunotherapy: Secondary | ICD-10-CM | POA: Diagnosis not present

## 2019-09-27 DIAGNOSIS — C9000 Multiple myeloma not having achieved remission: Secondary | ICD-10-CM

## 2019-09-27 MED ORDER — SODIUM CHLORIDE 0.9 % IV SOLN: Freq: Once | INTRAVENOUS | Status: AC

## 2019-09-27 MED ORDER — SODIUM CHLORIDE 0.9% FLUSH
10.0000 mL | INTRAVENOUS | Status: DC | PRN
  Administered 2019-09-27 (×2): 10 mL

## 2019-09-27 MED ORDER — DEXTROSE 5 % IV SOLN
27.0000 mg/m2 | Freq: Once | INTRAVENOUS | Status: AC
  Administered 2019-09-27: 56 mg via INTRAVENOUS
  Filled 2019-09-27: qty 28

## 2019-09-27 MED ORDER — ONDANSETRON HCL 4 MG/2ML IJ SOLN
4.0000 mg | Freq: Once | INTRAMUSCULAR | Status: AC
  Administered 2019-09-27: 4 mg via INTRAVENOUS
  Filled 2019-09-27: qty 2

## 2019-09-27 MED ORDER — HEPARIN SOD (PORK) LOCK FLUSH 100 UNIT/ML IV SOLN
500.0000 [IU] | Freq: Once | INTRAVENOUS | Status: AC | PRN
  Administered 2019-09-27: 500 [IU]

## 2019-09-27 NOTE — Patient Instructions (Signed)
[facility] Discharge Instructions for Patients Receiving Chemotherapy  Today you received the following chemotherapy agents   To help prevent nausea and vomiting after your treatment, we encourage you to take your nausea medication   If you develop nausea and vomiting that is not controlled by your nausea medication, call the clinic.   BELOW ARE SYMPTOMS THAT SHOULD BE REPORTED IMMEDIATELY:  *FEVER GREATER THAN 100.5 F  *CHILLS WITH OR WITHOUT FEVER  NAUSEA AND VOMITING THAT IS NOT CONTROLLED WITH YOUR NAUSEA MEDICATION  *UNUSUAL SHORTNESS OF BREATH  *UNUSUAL BRUISING OR BLEEDING  TENDERNESS IN MOUTH AND THROAT WITH OR WITHOUT PRESENCE OF ULCERS  *URINARY PROBLEMS  *BOWEL PROBLEMS  UNUSUAL RASH Items with * indicate a potential emergency and should be followed up as soon as possible.  Feel free to call the clinic should you have any questions or concerns. The clinic phone number is [phone].  Please show the CHEMO ALERT CARD at check-in to the Emergency Department and triage nurse.   

## 2019-09-27 NOTE — Progress Notes (Signed)
Patient presents today for Kyprolis. Vital signs are stable. Patient has no complaints of any changes since yesterday's treatment. Patient denies pain today. MAR reviewed.   Treatment given today per MD orders. Tolerated infusion without adverse affects. Vital signs stable. No complaints at this time. Discharged from clinic ambulatory. F/U with [facility] as scheduled.

## 2019-09-30 ENCOUNTER — Other Ambulatory Visit (HOSPITAL_COMMUNITY): Payer: Self-pay | Admitting: *Deleted

## 2019-09-30 DIAGNOSIS — C9000 Multiple myeloma not having achieved remission: Secondary | ICD-10-CM

## 2019-09-30 MED ORDER — POMALIDOMIDE 3 MG PO CAPS: 3.0000 mg | ORAL_CAPSULE | Freq: Every day | ORAL | 0 refills | Status: DC

## 2019-10-04 ENCOUNTER — Inpatient Hospital Stay (HOSPITAL_COMMUNITY): Payer: Medicare Other

## 2019-10-04 ENCOUNTER — Other Ambulatory Visit: Payer: Self-pay

## 2019-10-04 ENCOUNTER — Inpatient Hospital Stay (HOSPITAL_COMMUNITY): Payer: Medicare Other | Attending: Hematology

## 2019-10-04 ENCOUNTER — Encounter (HOSPITAL_COMMUNITY): Payer: Self-pay

## 2019-10-04 VITALS — BP 181/83 | HR 60 | Temp 97.3°F | Resp 18 | Wt 164.6 lb

## 2019-10-04 DIAGNOSIS — D631 Anemia in chronic kidney disease: Secondary | ICD-10-CM | POA: Diagnosis not present

## 2019-10-04 DIAGNOSIS — M549 Dorsalgia, unspecified: Secondary | ICD-10-CM | POA: Insufficient documentation

## 2019-10-04 DIAGNOSIS — Z5112 Encounter for antineoplastic immunotherapy: Secondary | ICD-10-CM | POA: Insufficient documentation

## 2019-10-04 DIAGNOSIS — C9000 Multiple myeloma not having achieved remission: Secondary | ICD-10-CM

## 2019-10-04 DIAGNOSIS — N185 Chronic kidney disease, stage 5: Secondary | ICD-10-CM

## 2019-10-04 DIAGNOSIS — N189 Chronic kidney disease, unspecified: Secondary | ICD-10-CM | POA: Insufficient documentation

## 2019-10-04 LAB — CBC WITH DIFFERENTIAL/PLATELET
Abs Immature Granulocytes: 0.04 10*3/uL (ref 0.00–0.07)
Basophils Absolute: 0.1 10*3/uL (ref 0.0–0.1)
Basophils Relative: 1 %
Eosinophils Absolute: 0.7 10*3/uL — ABNORMAL HIGH (ref 0.0–0.5)
Eosinophils Relative: 14 %
HCT: 29.4 % — ABNORMAL LOW (ref 39.0–52.0)
Hemoglobin: 9.6 g/dL — ABNORMAL LOW (ref 13.0–17.0)
Immature Granulocytes: 1 %
Lymphocytes Relative: 14 %
Lymphs Abs: 0.7 10*3/uL (ref 0.7–4.0)
MCH: 39.2 pg — ABNORMAL HIGH (ref 26.0–34.0)
MCHC: 32.7 g/dL (ref 30.0–36.0)
MCV: 120 fL — ABNORMAL HIGH (ref 80.0–100.0)
Monocytes Absolute: 1.2 10*3/uL — ABNORMAL HIGH (ref 0.1–1.0)
Monocytes Relative: 24 %
Neutro Abs: 2.4 10*3/uL (ref 1.7–7.7)
Neutrophils Relative %: 46 %
Platelets: 118 10*3/uL — ABNORMAL LOW (ref 150–400)
RBC: 2.45 MIL/uL — ABNORMAL LOW (ref 4.22–5.81)
RDW: 16.7 % — ABNORMAL HIGH (ref 11.5–15.5)
WBC: 5.1 10*3/uL (ref 4.0–10.5)
nRBC: 0 % (ref 0.0–0.2)

## 2019-10-04 LAB — COMPREHENSIVE METABOLIC PANEL
ALT: 17 U/L (ref 0–44)
AST: 15 U/L (ref 15–41)
Albumin: 3.6 g/dL (ref 3.5–5.0)
Alkaline Phosphatase: 52 U/L (ref 38–126)
Anion gap: 10 (ref 5–15)
BUN: 53 mg/dL — ABNORMAL HIGH (ref 8–23)
CO2: 20 mmol/L — ABNORMAL LOW (ref 22–32)
Calcium: 9.2 mg/dL (ref 8.9–10.3)
Chloride: 108 mmol/L (ref 98–111)
Creatinine, Ser: 6.15 mg/dL — ABNORMAL HIGH (ref 0.61–1.24)
GFR calc Af Amer: 10 mL/min — ABNORMAL LOW (ref >60)
GFR calc non Af Amer: 9 mL/min — ABNORMAL LOW (ref >60)
Glucose, Bld: 109 mg/dL — ABNORMAL HIGH (ref 70–99)
Potassium: 5 mmol/L (ref 3.5–5.1)
Sodium: 138 mmol/L (ref 135–145)
Total Bilirubin: 0.9 mg/dL (ref 0.3–1.2)
Total Protein: 5.6 g/dL — ABNORMAL LOW (ref 6.5–8.1)

## 2019-10-04 LAB — LACTATE DEHYDROGENASE: LDH: 154 U/L (ref 98–192)

## 2019-10-04 MED ORDER — SODIUM CHLORIDE 0.9% FLUSH
10.0000 mL | INTRAVENOUS | Status: DC | PRN
  Administered 2019-10-04: 10 mL via INTRAVENOUS

## 2019-10-04 MED ORDER — SODIUM CHLORIDE 0.9 % IV SOLN
20.0000 mg | Freq: Once | INTRAVENOUS | Status: AC
  Administered 2019-10-04: 20 mg via INTRAVENOUS
  Filled 2019-10-04: qty 20

## 2019-10-04 MED ORDER — ONDANSETRON HCL 4 MG/2ML IJ SOLN
INTRAMUSCULAR | Status: AC
  Filled 2019-10-04: qty 2

## 2019-10-04 MED ORDER — DEXTROSE 5 % IV SOLN
27.0000 mg/m2 | Freq: Once | INTRAVENOUS | Status: AC
  Administered 2019-10-04: 56 mg via INTRAVENOUS
  Filled 2019-10-04: qty 28

## 2019-10-04 MED ORDER — SODIUM CHLORIDE 0.9 % IV SOLN: Freq: Once | INTRAVENOUS | Status: AC

## 2019-10-04 MED ORDER — ONDANSETRON HCL 4 MG/2ML IJ SOLN
4.0000 mg | Freq: Once | INTRAMUSCULAR | Status: AC
  Administered 2019-10-04: 4 mg via INTRAVENOUS

## 2019-10-04 MED ORDER — EPOETIN ALFA-EPBX 10000 UNIT/ML IJ SOLN
20000.0000 [IU] | Freq: Once | INTRAMUSCULAR | Status: AC
  Administered 2019-10-04: 20000 [IU] via SUBCUTANEOUS
  Filled 2019-10-04: qty 2

## 2019-10-04 MED ORDER — SODIUM CHLORIDE 0.9% FLUSH
10.0000 mL | INTRAVENOUS | Status: DC | PRN
  Administered 2019-10-04: 10 mL

## 2019-10-04 MED ORDER — HEPARIN SOD (PORK) LOCK FLUSH 100 UNIT/ML IV SOLN
500.0000 [IU] | Freq: Once | INTRAVENOUS | Status: AC | PRN
  Administered 2019-10-04: 500 [IU]

## 2019-10-04 NOTE — Progress Notes (Signed)
Patient presents today for treatment. Labs reviewed by [facility] NP. Proceed with treatment order received. Creatinine 6.15. NP aware. Patient taking Pomalyst as prescribed with no side effects to report.   Treatment given today per MD orders. Tolerated infusion without adverse affects. Vital signs stable. No complaints at this time. Discharged from clinic ambulatory. F/U with [facility] as scheduled.

## 2019-10-04 NOTE — Patient Instructions (Signed)
[facility] Discharge Instructions for Patients Receiving Chemotherapy  Today you received the following chemotherapy agents   To help prevent nausea and vomiting after your treatment, we encourage you to take your nausea medication   If you develop nausea and vomiting that is not controlled by your nausea medication, call the clinic.   BELOW ARE SYMPTOMS THAT SHOULD BE REPORTED IMMEDIATELY:  *FEVER GREATER THAN 100.5 F  *CHILLS WITH OR WITHOUT FEVER  NAUSEA AND VOMITING THAT IS NOT CONTROLLED WITH YOUR NAUSEA MEDICATION  *UNUSUAL SHORTNESS OF BREATH  *UNUSUAL BRUISING OR BLEEDING  TENDERNESS IN MOUTH AND THROAT WITH OR WITHOUT PRESENCE OF ULCERS  *URINARY PROBLEMS  *BOWEL PROBLEMS  UNUSUAL RASH Items with * indicate a potential emergency and should be followed up as soon as possible.  Feel free to call the clinic should you have any questions or concerns. The clinic phone number is [phone].  Please show the CHEMO ALERT CARD at check-in to the Emergency Department and triage nurse.   

## 2019-10-05 ENCOUNTER — Encounter (HOSPITAL_COMMUNITY): Payer: Self-pay

## 2019-10-05 ENCOUNTER — Inpatient Hospital Stay (HOSPITAL_COMMUNITY): Payer: Medicare Other

## 2019-10-05 ENCOUNTER — Other Ambulatory Visit (HOSPITAL_COMMUNITY): Payer: Self-pay | Admitting: *Deleted

## 2019-10-05 VITALS — BP 153/76 | HR 67 | Temp 97.3°F | Resp 18

## 2019-10-05 DIAGNOSIS — C9000 Multiple myeloma not having achieved remission: Secondary | ICD-10-CM

## 2019-10-05 DIAGNOSIS — Z5112 Encounter for antineoplastic immunotherapy: Secondary | ICD-10-CM | POA: Diagnosis not present

## 2019-10-05 LAB — PROTEIN ELECTROPHORESIS, SERUM
A/G Ratio: 2.1 — ABNORMAL HIGH (ref 0.7–1.7)
Albumin ELP: 3.5 g/dL (ref 2.9–4.4)
Alpha-1-Globulin: 0.1 g/dL (ref 0.0–0.4)
Alpha-2-Globulin: 0.5 g/dL (ref 0.4–1.0)
Beta Globulin: 0.7 g/dL (ref 0.7–1.3)
Gamma Globulin: 0.4 g/dL (ref 0.4–1.8)
Globulin, Total: 1.7 g/dL — ABNORMAL LOW (ref 2.2–3.9)
Total Protein ELP: 5.2 g/dL — ABNORMAL LOW (ref 6.0–8.5)

## 2019-10-05 LAB — KAPPA/LAMBDA LIGHT CHAINS
Kappa free light chain: 2660.3 mg/L — ABNORMAL HIGH (ref 3.3–19.4)
Kappa, lambda light chain ratio: 204.64 — ABNORMAL HIGH (ref 0.26–1.65)
Lambda free light chains: 13 mg/L (ref 5.7–26.3)

## 2019-10-05 MED ORDER — DEXTROSE 5 % IV SOLN
27.0000 mg/m2 | Freq: Once | INTRAVENOUS | Status: AC
  Administered 2019-10-05: 56 mg via INTRAVENOUS
  Filled 2019-10-05: qty 28

## 2019-10-05 MED ORDER — ONDANSETRON HCL 4 MG/2ML IJ SOLN
4.0000 mg | Freq: Once | INTRAMUSCULAR | Status: AC
  Administered 2019-10-05: 4 mg via INTRAVENOUS
  Filled 2019-10-05: qty 2

## 2019-10-05 MED ORDER — SODIUM CHLORIDE 0.9% FLUSH
10.0000 mL | INTRAVENOUS | Status: DC | PRN
  Administered 2019-10-05: 10 mL

## 2019-10-05 MED ORDER — SODIUM CHLORIDE 0.9 % IV SOLN: Freq: Once | INTRAVENOUS | Status: AC

## 2019-10-05 MED ORDER — HEPARIN SOD (PORK) LOCK FLUSH 100 UNIT/ML IV SOLN
500.0000 [IU] | Freq: Once | INTRAVENOUS | Status: AC | PRN
  Administered 2019-10-05: 500 [IU]

## 2019-10-05 MED ORDER — HYDROMORPHONE HCL 4 MG PO TABS: 4.0000 mg | ORAL_TABLET | ORAL | 0 refills | Status: DC | PRN

## 2019-10-05 NOTE — Progress Notes (Signed)
Patient presents today for treatment. Vital signs within parameters for treatment. Patient denies any pain today. Patient denies any significant changes since his last visit.   Treatment given today per MD orders. Tolerated infusion without adverse affects. Vital signs stable. No complaints at this time. Discharged from clinic ambulatory. F/U with [facility] as scheduled.

## 2019-10-05 NOTE — Patient Instructions (Signed)
[facility] Discharge Instructions for Patients Receiving Chemotherapy  Today you received the following chemotherapy agents   To help prevent nausea and vomiting after your treatment, we encourage you to take your nausea medication   If you develop nausea and vomiting that is not controlled by your nausea medication, call the clinic.   BELOW ARE SYMPTOMS THAT SHOULD BE REPORTED IMMEDIATELY:  *FEVER GREATER THAN 100.5 F  *CHILLS WITH OR WITHOUT FEVER  NAUSEA AND VOMITING THAT IS NOT CONTROLLED WITH YOUR NAUSEA MEDICATION  *UNUSUAL SHORTNESS OF BREATH  *UNUSUAL BRUISING OR BLEEDING  TENDERNESS IN MOUTH AND THROAT WITH OR WITHOUT PRESENCE OF ULCERS  *URINARY PROBLEMS  *BOWEL PROBLEMS  UNUSUAL RASH Items with * indicate a potential emergency and should be followed up as soon as possible.  Feel free to call the clinic should you have any questions or concerns. The clinic phone number is [phone].  Please show the CHEMO ALERT CARD at check-in to the Emergency Department and triage nurse.   

## 2019-10-06 ENCOUNTER — Ambulatory Visit (HOSPITAL_COMMUNITY)
Admission: RE | Admit: 2019-10-06 | Discharge: 2019-10-06 | Disposition: A | Discharge status: Home / Self Care | Payer: Medicare Other | Source: Ambulatory Visit | Attending: Hematology | Admitting: Hematology

## 2019-10-06 ENCOUNTER — Other Ambulatory Visit: Payer: Self-pay

## 2019-10-06 DIAGNOSIS — C9000 Multiple myeloma not having achieved remission: Secondary | ICD-10-CM

## 2019-10-11 ENCOUNTER — Other Ambulatory Visit (HOSPITAL_COMMUNITY): Payer: Self-pay | Admitting: *Deleted

## 2019-10-17 ENCOUNTER — Inpatient Hospital Stay (HOSPITAL_COMMUNITY): Payer: Medicare Other

## 2019-10-17 ENCOUNTER — Other Ambulatory Visit: Payer: Self-pay

## 2019-10-17 ENCOUNTER — Inpatient Hospital Stay (HOSPITAL_BASED_OUTPATIENT_CLINIC_OR_DEPARTMENT_OTHER): Payer: Medicare Other | Admitting: Hematology

## 2019-10-17 VITALS — BP 141/57 | HR 71 | Temp 97.8°F | Resp 18

## 2019-10-17 VITALS — BP 157/70 | HR 55 | Temp 97.5°F | Resp 18 | Wt 159.0 lb

## 2019-10-17 DIAGNOSIS — Z5112 Encounter for antineoplastic immunotherapy: Secondary | ICD-10-CM | POA: Diagnosis not present

## 2019-10-17 DIAGNOSIS — C9000 Multiple myeloma not having achieved remission: Secondary | ICD-10-CM

## 2019-10-17 DIAGNOSIS — N185 Chronic kidney disease, stage 5: Secondary | ICD-10-CM

## 2019-10-17 LAB — CBC WITH DIFFERENTIAL/PLATELET
Band Neutrophils: 1 %
Basophils Absolute: 0 10*3/uL (ref 0.0–0.1)
Basophils Relative: 1 %
Eosinophils Absolute: 0.1 10*3/uL (ref 0.0–0.5)
Eosinophils Relative: 3 %
HCT: 29.2 % — ABNORMAL LOW (ref 39.0–52.0)
Hemoglobin: 9.7 g/dL — ABNORMAL LOW (ref 13.0–17.0)
Lymphocytes Relative: 23 %
Lymphs Abs: 0.7 10*3/uL (ref 0.7–4.0)
MCH: 40.2 pg — ABNORMAL HIGH (ref 26.0–34.0)
MCHC: 33.2 g/dL (ref 30.0–36.0)
MCV: 121.2 fL — ABNORMAL HIGH (ref 80.0–100.0)
Metamyelocytes Relative: 1 %
Monocytes Absolute: 0.1 10*3/uL (ref 0.1–1.0)
Monocytes Relative: 3 %
Neutro Abs: 2.2 10*3/uL (ref 1.7–7.7)
Neutrophils Relative %: 68 %
Platelets: 157 10*3/uL (ref 150–400)
RBC: 2.41 MIL/uL — ABNORMAL LOW (ref 4.22–5.81)
RDW: 16.7 % — ABNORMAL HIGH (ref 11.5–15.5)
WBC: 3.2 10*3/uL — ABNORMAL LOW (ref 4.0–10.5)
nRBC: 0 % (ref 0.0–0.2)

## 2019-10-17 LAB — COMPREHENSIVE METABOLIC PANEL
ALT: 12 U/L (ref 0–44)
AST: 16 U/L (ref 15–41)
Albumin: 3.9 g/dL (ref 3.5–5.0)
Alkaline Phosphatase: 53 U/L (ref 38–126)
Anion gap: 10 (ref 5–15)
BUN: 46 mg/dL — ABNORMAL HIGH (ref 8–23)
CO2: 23 mmol/L (ref 22–32)
Calcium: 9.9 mg/dL (ref 8.9–10.3)
Chloride: 106 mmol/L (ref 98–111)
Creatinine, Ser: 6.32 mg/dL — ABNORMAL HIGH (ref 0.61–1.24)
GFR calc Af Amer: 10 mL/min — ABNORMAL LOW (ref >60)
GFR calc non Af Amer: 9 mL/min — ABNORMAL LOW (ref >60)
Glucose, Bld: 107 mg/dL — ABNORMAL HIGH (ref 70–99)
Potassium: 4.5 mmol/L (ref 3.5–5.1)
Sodium: 139 mmol/L (ref 135–145)
Total Bilirubin: 0.9 mg/dL (ref 0.3–1.2)
Total Protein: 6 g/dL — ABNORMAL LOW (ref 6.5–8.1)

## 2019-10-17 MED ORDER — SODIUM CHLORIDE 0.9 % IV SOLN: Freq: Once | INTRAVENOUS | Status: AC

## 2019-10-17 MED ORDER — ONDANSETRON HCL 4 MG/2ML IJ SOLN
4.0000 mg | Freq: Once | INTRAMUSCULAR | Status: AC
  Administered 2019-10-17: 4 mg via INTRAVENOUS
  Filled 2019-10-17: qty 2

## 2019-10-17 MED ORDER — SODIUM CHLORIDE 0.9% FLUSH
10.0000 mL | INTRAVENOUS | Status: DC | PRN
  Administered 2019-10-17: 10 mL

## 2019-10-17 MED ORDER — HEPARIN SOD (PORK) LOCK FLUSH 100 UNIT/ML IV SOLN
500.0000 [IU] | Freq: Once | INTRAVENOUS | Status: AC | PRN
  Administered 2019-10-17: 500 [IU]

## 2019-10-17 MED ORDER — DENOSUMAB 120 MG/1.7ML ~~LOC~~ SOLN
120.0000 mg | Freq: Once | SUBCUTANEOUS | Status: AC
  Administered 2019-10-17: 120 mg via SUBCUTANEOUS
  Filled 2019-10-17: qty 1.7

## 2019-10-17 MED ORDER — EPOETIN ALFA-EPBX 10000 UNIT/ML IJ SOLN
20000.0000 [IU] | Freq: Once | INTRAMUSCULAR | Status: AC
  Administered 2019-10-17: 20000 [IU] via SUBCUTANEOUS
  Filled 2019-10-17: qty 2

## 2019-10-17 MED ORDER — SODIUM CHLORIDE 0.9 % IV SOLN
40.0000 mg | Freq: Once | INTRAVENOUS | Status: AC
  Administered 2019-10-17: 40 mg via INTRAVENOUS
  Filled 2019-10-17: qty 4

## 2019-10-17 MED ORDER — DEXTROSE 5 % IV SOLN
27.0000 mg/m2 | Freq: Once | INTRAVENOUS | Status: AC
  Administered 2019-10-17: 56 mg via INTRAVENOUS
  Filled 2019-10-17: qty 28

## 2019-10-17 NOTE — Progress Notes (Signed)
[name], [place] [address]   CLINIC:  Medical Oncology/Hematology  PCP:  Brandon Fire, MD [place] / [place] [address] [phone]   REASON FOR VISIT:  Follow-up for multiple myeloma  PRIOR THERAPY: CyBorD from 06/17/2018 to 04/04/2019  CURRENT THERAPY: Carfilzomib & pomalidomide  BRIEF ONCOLOGIC HISTORY:  Oncology History  Kappa light chain myeloma ([place])  06/16/2018 Initial Diagnosis   Kappa light chain myeloma ([place])   06/17/2018 - 04/10/2019 Chemotherapy   The patient had palonosetron (ALOXI) injection 0.25 mg, 0.25 mg, Intravenous,  Once, 27 of 27 cycles Administration: 0.25 mg (06/17/2018), 0.25 mg (06/23/2018), 0.25 mg (06/30/2018), 0.25 mg (07/07/2018), 0.25 mg (07/14/2018), 0.25 mg (07/22/2018), 0.25 mg (07/29/2018), 0.25 mg (08/06/2018), 0.25 mg (08/13/2018), 0.25 mg (08/20/2018), 0.25 mg (08/27/2018), 0.25 mg (09/06/2018), 0.25 mg (09/13/2018), 0.25 mg (09/20/2018), 0.25 mg (09/27/2018), 0.25 mg (10/04/2018), 0.25 mg (10/11/2018), 0.25 mg (10/18/2018), 0.25 mg (10/25/2018), 0.25 mg (11/01/2018), 0.25 mg (11/08/2018), 0.25 mg (11/15/2018), 0.25 mg (11/22/2018), 0.25 mg (11/29/2018), 0.25 mg (12/08/2018), 0.25 mg (12/16/2018) bortezomib SQ (VELCADE) chemo injection 2.5 mg, 1.3 mg/m2 = 2.5 mg, Subcutaneous,  Once, 41 of 42 cycles Administration: 2.5 mg (06/17/2018), 2.5 mg (06/23/2018), 2.5 mg (06/30/2018), 2.5 mg (07/07/2018), 2.5 mg (07/14/2018), 2.5 mg (07/22/2018), 2.5 mg (07/29/2018), 2.5 mg (08/06/2018), 2.5 mg (08/13/2018), 2.5 mg (08/20/2018), 2.5 mg (08/27/2018), 2.5 mg (09/06/2018), 2.5 mg (09/13/2018), 2.5 mg (09/20/2018), 2.5 mg (09/27/2018), 2.5 mg (10/04/2018), 2.5 mg (10/11/2018), 2.5 mg (10/18/2018), 2.5 mg (10/25/2018), 2.5 mg (11/01/2018), 2.5 mg (11/08/2018), 2.5 mg (11/15/2018), 2.5 mg (11/22/2018), 2.5 mg (11/29/2018), 2.5 mg (12/08/2018), 2.5 mg (12/16/2018), 2.5 mg (12/27/2018), 2.5 mg (01/03/2019), 2.5 mg (01/10/2019), 2.5 mg (01/17/2019), 2.5 mg (01/24/2019), 2.5 mg  (01/31/2019), 2.5 mg (02/07/2019), 2.5 mg (02/14/2019), 2.5 mg (02/21/2019), 2.5 mg (02/28/2019), 2.5 mg (03/07/2019), 2.5 mg (03/14/2019), 2.5 mg (03/21/2019), 2.5 mg (03/29/2019), 2.5 mg (04/04/2019) cyclophosphamide (CYTOXAN) 300 mg in sodium chloride 0.9 % 250 mL chemo infusion, 300 mg (100 % of original dose 300 mg), Intravenous,  Once, 26 of 26 cycles Dose modification: 300 mg (original dose 300 mg, Cycle 1, Reason: Change in SCr/CrCl), 500 mg (original dose 500 mg, Cycle 2, Reason: Provider Judgment), 500 mg (original dose 500 mg, Cycle 3, Reason: Change in SCr/CrCl) Administration: 300 mg (06/17/2018), 500 mg (06/23/2018), 500 mg (06/30/2018), 600 mg (07/07/2018), 600 mg (07/14/2018), 600 mg (07/22/2018), 600 mg (07/29/2018), 600 mg (08/06/2018), 600 mg (08/13/2018), 600 mg (08/20/2018), 600 mg (08/27/2018), 600 mg (09/06/2018), 600 mg (09/13/2018), 600 mg (09/20/2018), 600 mg (09/27/2018), 600 mg (10/04/2018), 600 mg (10/11/2018), 600 mg (10/18/2018), 600 mg (10/25/2018), 600 mg (11/01/2018), 600 mg (11/08/2018), 600 mg (11/15/2018), 600 mg (11/22/2018), 600 mg (11/29/2018), 600 mg (12/08/2018), 600 mg (12/16/2018)  for chemotherapy treatment.    04/21/2019 -  Chemotherapy   The patient had ondansetron (ZOFRAN) injection 4 mg, 4 mg (100 % of original dose 4 mg), Intravenous,  Once, 5 of 7 cycles Dose modification: 8 mg (original dose 4 mg, Cycle 2), 4 mg (original dose 4 mg, Cycle 2), 4 mg (original dose 4 mg, Cycle 2) Administration: 4 mg (06/09/2019), 4 mg (06/10/2019), 4 mg (06/23/2019), 4 mg (06/24/2019), 4 mg (07/04/2019), 4 mg (07/05/2019), 4 mg (07/11/2019), 4 mg (07/12/2019), 4 mg (08/22/2019), 4 mg (08/23/2019), 4 mg (08/30/2019), 4 mg (08/31/2019), 4 mg (09/06/2019), 4 mg (09/07/2019), 4 mg (07/25/2019), 4 mg (07/26/2019), 4 mg (08/01/2019), 4 mg (08/02/2019), 4 mg (09/19/2019), 4 mg (09/20/2019), 4 mg (  09/26/2019), 4 mg (09/27/2019), 4 mg (10/04/2019), 4 mg (10/05/2019) carfilzomib (KYPROLIS) 40 mg in dextrose 5 % 50 mL chemo infusion, 20 mg/m2 = 40 mg,  Intravenous,  Once, 6 of 8 cycles Dose modification: 27 mg/m2 (original dose 27 mg/m2, Cycle 1, Reason: Provider Judgment) Administration: 40 mg (04/21/2019), 40 mg (04/22/2019), 60 mg (04/28/2019), 60 mg (04/29/2019), 60 mg (05/05/2019), 60 mg (05/06/2019), 56 mg (05/26/2019), 56 mg (05/27/2019), 56 mg (06/02/2019), 56 mg (06/03/2019), 56 mg (06/09/2019), 56 mg (06/10/2019), 56 mg (08/22/2019), 56 mg (08/23/2019), 56 mg (08/30/2019), 56 mg (08/31/2019), 56 mg (09/06/2019), 56 mg (09/07/2019), 56 mg (06/23/2019), 56 mg (06/24/2019), 56 mg (07/04/2019), 56 mg (07/05/2019), 56 mg (07/11/2019), 56 mg (07/12/2019), 56 mg (07/25/2019), 56 mg (07/26/2019), 56 mg (08/01/2019), 56 mg (08/02/2019), 56 mg (09/19/2019), 56 mg (09/20/2019), 56 mg (09/26/2019), 56 mg (09/27/2019), 56 mg (10/04/2019), 56 mg (10/05/2019)  for chemotherapy treatment.      CANCER STAGING: Cancer Staging No matching staging information was found for the patient.  INTERVAL HISTORY:  [name], a 59 y.o. male, returns for routine follow-up and consideration for next cycle of chemotherapy. [name] was last seen on 08/22/2019.  Due for cycle #7 of carfilzomib today.   Overall, today he tells me he has been feeling pretty well. He reports still having some pain in his neck and right shoulder blade over the weekend, especially when it rains. He did not eat well over the weekend due to nausea on Friday and Saturday. He will start taking the Pomalyst tonight.  Overall, he feels ready for next cycle of chemo today.    REVIEW OF SYSTEMS:  Review of Systems  Constitutional: Positive for appetite change (severely decreased) and fatigue (severe).  Cardiovascular: Negative for leg swelling.  Gastrointestinal: Positive for constipation, nausea and vomiting.  Musculoskeletal: Positive for arthralgias (10/10 R rib pain ) and neck pain.  Neurological: Positive for headaches.  Psychiatric/Behavioral: Positive for sleep disturbance.  All other systems reviewed and are  negative.   PAST MEDICAL/SURGICAL HISTORY:  Past Medical History:  Diagnosis Date  . Allergy   . Arthritis    neck and back  . Blood transfusion without reported diagnosis   . Bone metastases ([place]) 09/19/2019  . BPH (benign prostatic hyperplasia)   . Cancer [facility]: [facility]) 2004   testicle  . Chronic kidney disease    kidney stones  . Left lumbar radiculopathy 06/12/2016  . 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 Medical history non-contributory    Pt has scattered thoughts and uncertain of past medical history  . 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 Stones, urinary tract   . Substance abuse ([place])    prescribed oxydcodone- 10 years   Past Surgical History:  Procedure Laterality Date  . BACK SURGERY     x5  . CERVICAL DISC SURGERY     x2  . COLONOSCOPY WITH PROPOFOL N/A 08/11/2016   Dr. Gala Romney: non-bleeding internal hemorrhoids, one 4 mm hyperplastic rectal polyp, diverticulosis in entire examined colon  . POLYPECTOMY  08/11/2016   Procedure: POLYPECTOMY;  Surgeon: [name], MD;  Location: [facility] ENDO SUITE;  Service: Endoscopy;;  colon  . PORTACATH PLACEMENT Left 09/20/2018   Procedure: INSERTION PORT-A-CATH (catheter attached left subclavian);  Surgeon: Virl Cagey, MD;  Location: [facility] ORS;  Service: General;  Laterality: Left;  . testicular cancer  2004  . TONSILLECTOMY      SOCIAL HISTORY:  Social History   Socioeconomic History  . Marital status: Single  Spouse name: Not on file  . Number of children: 1  . Years of education: 76  . Highest education level: Not on file  Occupational History  . Occupation: retired    Comment: Psychologist, prison and probation services  . Occupation: disabled  Tobacco Use  . Smoking status: Current Every Day Smoker    Packs/day: 1.00    Years: 40.00    Pack years: 40.00    Types: Cigarettes    Start date: 03/31/1974  . Smokeless tobacco: Never Used  Vaping Use  . Vaping Use: Never used  Substance and Sexual Activity  . Alcohol use: Yes    Comment: Occasional  .  Drug use: No    Types: Cocaine    Comment: Remote hx of cocaine, quit 1989  . Sexual activity: Yes  Other Topics Concern  . Not on file  Social History Narrative   [employer] for 12 years   [employer] for 51 years      Never married   One daughter      Lives alone   Retail banker   Right-handed   Occasional caffeine use         Social Determinants of Health   Financial Resource Strain:   . Difficulty of Paying Living Expenses:   Food Insecurity:   . Worried About Charity fundraiser in the Last Year:   . Arboriculturist in the Last Year:   Transportation Needs:   . Film/video editor (Medical):   Marland Kitchen Lack of Transportation (Non-Medical):   Physical Activity:   . Days of Exercise per Week:   . Minutes of Exercise per Session:   Stress:   . Feeling of Stress :   Social Connections:   . Frequency of Communication with Friends and Family:   . Frequency of Social Gatherings with Friends and Family:   . Attends Religious Services:   . Active Member of Clubs or Organizations:   . Attends Archivist Meetings:   Marland Kitchen Marital Status:   Intimate Partner Violence: Not At Risk  . Fear of Current or Ex-Partner: No  . Emotionally Abused: No  . Physically Abused: No  . Sexually Abused: No    FAMILY HISTORY:  Family History  Problem Relation Age of Onset  . COPD Mother   . Heart disease Mother   . Other Father        Never knew his father.  . Thyroid disease Sister   . Arthritis Sister   . Heart disease Sister        bypass  . Colon cancer Neg Hx     CURRENT MEDICATIONS:  Current Outpatient Medications  Medication Sig Dispense Refill  . aspirin EC 81 MG tablet Take 81 mg by mouth daily.    . calcitRIOL (ROCALTROL) 0.5 MCG capsule Take 0.5 mcg by mouth daily.     Marland Kitchen CARFILZOMIB IV Inject into the vein.    Marland Kitchen Epoetin Alfa (PROCRIT IJ) Inject as directed. Unsure of dosage- gets this once weekly    . HYDROmorphone (DILAUDID) 4 MG tablet Take 1  tablet (4 mg total) by mouth every 4 (four) hours as needed for severe pain. 126 tablet 0  . Misc. Devices MISC Please provide patient with rollaider.  Dx: multiple myeloma C90.0 1 each 99  . Patiromer Sorbitex Calcium (VELTASSA PO) Take by mouth 2 (two) times a week.     . pomalidomide (POMALYST) 3 MG capsule Take 1 capsule (3  mg total) by mouth daily. 21 capsule 0  . senna (SENOKOT) 8.6 MG tablet Take 1 tablet by mouth 2 (two) times daily.     . sodium bicarbonate 650 MG tablet Take 1,300 mg by mouth 2 (two) times daily.    Marland Kitchen torsemide (DEMADEX) 20 MG tablet TAKING 1 TABLET DAILY, AND 2 TABLETS ONLY IF HIS ANKLES ARE REALLY SWOLLEN 180 tablet 1  . valACYclovir (VALTREX) 500 MG tablet TAKE 1 TABLET BY MOUTH TWICE A DAY 180 tablet 2  . prochlorperazine (COMPAZINE) 5 MG tablet Take 1 tablet (5 mg total) by mouth every 6 (six) hours as needed for nausea or vomiting. (Patient not taking: Reported on 10/17/2019) 30 tablet 2   No current facility-administered medications for this visit.    ALLERGIES:  Allergies  Allergen Reactions  . Oxycodone-Acetaminophen   . Acetaminophen Nausea Only and Other (See Comments)    Elevated liver enzymes  . Cymbalta [Duloxetine Hcl] Swelling    Facial swelling  . Diclofenac Sodium Swelling       . Diclofenac Sodium Swelling  . Imodium [Loperamide] Swelling    facial  . Lyrica [Pregabalin] Swelling  . Morphine Other (See Comments)    Bradycardia   . Vioxx [Rofecoxib] Swelling  . Aleve [Naproxen] Swelling    eye    PHYSICAL EXAM:  Performance status (ECOG): 1 - Symptomatic but completely ambulatory  Vitals:   10/17/19 1014  BP: (!) 157/70  Pulse: (!) 55  Resp: 18  Temp: (!) 97.5 F (36.4 C)  SpO2: 95%   Wt Readings from Last 3 Encounters:  10/17/19 159 lb (72.1 kg)  10/04/19 164 lb 9.6 oz (74.7 kg)  09/26/19 165 lb 3.2 oz (74.9 kg)   Physical Exam Vitals reviewed.  Constitutional:      Appearance: Normal appearance.  Cardiovascular:      Rate and Rhythm: Normal rate and regular rhythm.     Pulses: Normal pulses.     Heart sounds: Normal heart sounds.  Pulmonary:     Effort: Pulmonary effort is normal.     Breath sounds: Normal breath sounds.  Chest:     Comments: Port on L chest Musculoskeletal:     Right lower leg: No edema.     Left lower leg: No edema.  Neurological:     General: No focal deficit present.     Mental Status: He is alert and oriented to person, place, and time.  Psychiatric:        Mood and Affect: Mood normal.        Behavior: Behavior normal.     LABORATORY DATA:  I have reviewed the labs as listed.  CBC Latest Ref Rng & Units 10/17/2019 10/04/2019 09/26/2019  WBC 4.0 - 10.5 K/uL 3.2(L) 5.1 4.0  Hemoglobin 13.0 - 17.0 g/dL 9.7(L) 9.6(L) 9.4(L)  Hematocrit 39 - 52 % 29.2(L) 29.4(L) 29.0(L)  Platelets 150 - 400 K/uL 157 118(L) 110(L)   CMP Latest Ref Rng & Units 10/17/2019 10/04/2019 09/26/2019  Glucose 70 - 99 mg/dL 107(H) 109(H) 109(H)  BUN 8 - 23 mg/dL 46(H) 53(H) 58(H)  Creatinine 0.61 - 1.24 mg/dL 6.32(H) 6.15(H) 6.90(H)  Sodium 135 - 145 mmol/L 139 138 137  Potassium 3.5 - 5.1 mmol/L 4.5 5.0 5.7(H)  Chloride 98 - 111 mmol/L 106 108 104  CO2 22 - 32 mmol/L 23 20(L) 22  Calcium 8.9 - 10.3 mg/dL 9.9 9.2 9.5  Total Protein 6.5 - 8.1 g/dL 6.0(L) 5.6(L) 5.6(L)  Total Bilirubin 0.3 -  1.2 mg/dL 0.9 0.9 0.5  Alkaline Phos 38 - 126 U/L 53 52 58  AST 15 - 41 U/L 16 15 18   ALT 0 - 44 U/L 12 17 15    Lab Results  Component Value Date   LDH 154 10/04/2019   LDH 152 08/30/2019   LDH 142 08/08/2019   Lab Results  Component Value Date   TOTALPROTELP 5.2 (L) 10/04/2019   ALBUMINELP 3.5 10/04/2019   A1GS 0.1 10/04/2019   A2GS 0.5 10/04/2019   BETS 0.7 10/04/2019   GAMS 0.4 10/04/2019   MSPIKE Not Observed 10/04/2019   SPEI Comment 10/04/2019    Lab Results  Component Value Date   KPAFRELGTCHN 2,660.3 (H) 10/04/2019   LAMBDASER 13.0 10/04/2019   KAPLAMBRATIO 204.64 (H) 10/04/2019      DIAGNOSTIC IMAGING:  I have independently reviewed the scans and discussed with the patient. MR Thoracic Spine Wo Contrast  Result Date: 10/07/2019 CLINICAL DATA:  Multiple myeloma on going chemotherapy treatment. EXAM: MRI THORACIC AND LUMBAR SPINE WITHOUT CONTRAST TECHNIQUE: Multiplanar and multiecho pulse sequences of the thoracic and lumbar spine were obtained without intravenous contrast. COMPARISON:  MRI a 04/28/2019 FINDINGS: MRI THORACIC SPINE FINDINGS Alignment:  Normal Vertebrae: Small lesion noted in the T7 vertebral body superiorly and posteriorly. I do not see this for certain on the prior study but prior study was fairly limited. T10 vertebral body lesion appears stable when compared to the prior study. Rounded lesion in the superior and posterior aspect of T11 on the right side I believe was present on the prior study but is better seen on today's examination. Large lesion involving the T12 vertebral body is also better seen on the prior study. Cord: Normal cord signal intensity. No cord lesions or syrinx. Ligamentum flavum thickening and possible calcification at T3. There is mild spinal stenosis at this level which appears stable. Similar findings at T2-3. Paraspinal and other soft tissues: Right seventh posterior rib lesion at may be larger or just better seen because of motion on the prior study. There does appear to be a new adjacent soft tissue component. Right ninth posterior rib lesion appears grossly stable. Disc levels: Stable mild spinal and lateral recess stenosis at T2-3 and T3-4. No large thoracic disc protrusions are identified. I do not see any significant inter spinous tumor or cord compromise. MRI LUMBAR SPINE FINDINGS Segmentation: There are five lumbar type vertebral bodies. The last full intervertebral disc space is labeled L5-S1. Alignment:  Normal overall alignment. Vertebrae: Extensive surgical changes with pedicle screws and posterior rods extending from L1-L4.  Associated artifact. Underlying myelomatous changes are suspected in T12, L2 and L3. I think these are grossly stable compared to the prior study despite the motion artifact on that exam. I do not see any obvious new lesions or progressive findings. Conus medullaris and cauda equina: Conus extends to the L1 level. Conus and cauda equina appear normal. Paraspinal and other soft tissues: No significant paraspinal or retroperitoneal findings. Disc levels: Posterior and interbody fusion changes with wide decompressive laminectomies at L1-2, L2-3 and L3-4. Stable diffuse bulging annulus and facet disease at L4-5 with mild spinal and bilateral lateral recess stenosis. There is a laminectomy defect noted. Annular fissure and small central disc protrusion at L5-S1 appears stable. Stable moderate facet disease at this level also. IMPRESSION: 1. T7, T10, T11 and T12 lesions are slightly better seen on this study but the prior study was degraded by motion artifact. I do not see any definite new findings.  2. Overall grossly stable appearing myelomatous changes at T12, L2 and L3. No obvious progressive findings. 3. Right seventh and ninth posterior rib lesions are probably stable. I suspect there is a new soft tissue component involving the seventh rib. Electronically Signed   By: Marijo Sanes M.D.   On: 10/07/2019 16:29   MR Lumbar Spine Wo Contrast  Result Date: 10/07/2019 CLINICAL DATA:  Multiple myeloma on going chemotherapy treatment. EXAM: MRI THORACIC AND LUMBAR SPINE WITHOUT CONTRAST TECHNIQUE: Multiplanar and multiecho pulse sequences of the thoracic and lumbar spine were obtained without intravenous contrast. COMPARISON:  MRI a 04/28/2019 FINDINGS: MRI THORACIC SPINE FINDINGS Alignment:  Normal Vertebrae: Small lesion noted in the T7 vertebral body superiorly and posteriorly. I do not see this for certain on the prior study but prior study was fairly limited. T10 vertebral body lesion appears stable when compared  to the prior study. Rounded lesion in the superior and posterior aspect of T11 on the right side I believe was present on the prior study but is better seen on today's examination. Large lesion involving the T12 vertebral body is also better seen on the prior study. Cord: Normal cord signal intensity. No cord lesions or syrinx. Ligamentum flavum thickening and possible calcification at T3. There is mild spinal stenosis at this level which appears stable. Similar findings at T2-3. Paraspinal and other soft tissues: Right seventh posterior rib lesion at may be larger or just better seen because of motion on the prior study. There does appear to be a new adjacent soft tissue component. Right ninth posterior rib lesion appears grossly stable. Disc levels: Stable mild spinal and lateral recess stenosis at T2-3 and T3-4. No large thoracic disc protrusions are identified. I do not see any significant inter spinous tumor or cord compromise. MRI LUMBAR SPINE FINDINGS Segmentation: There are five lumbar type vertebral bodies. The last full intervertebral disc space is labeled L5-S1. Alignment:  Normal overall alignment. Vertebrae: Extensive surgical changes with pedicle screws and posterior rods extending from L1-L4. Associated artifact. Underlying myelomatous changes are suspected in T12, L2 and L3. I think these are grossly stable compared to the prior study despite the motion artifact on that exam. I do not see any obvious new lesions or progressive findings. Conus medullaris and cauda equina: Conus extends to the L1 level. Conus and cauda equina appear normal. Paraspinal and other soft tissues: No significant paraspinal or retroperitoneal findings. Disc levels: Posterior and interbody fusion changes with wide decompressive laminectomies at L1-2, L2-3 and L3-4. Stable diffuse bulging annulus and facet disease at L4-5 with mild spinal and bilateral lateral recess stenosis. There is a laminectomy defect noted. Annular  fissure and small central disc protrusion at L5-S1 appears stable. Stable moderate facet disease at this level also. IMPRESSION: 1. T7, T10, T11 and T12 lesions are slightly better seen on this study but the prior study was degraded by motion artifact. I do not see any definite new findings. 2. Overall grossly stable appearing myelomatous changes at T12, L2 and L3. No obvious progressive findings. 3. Right seventh and ninth posterior rib lesions are probably stable. I suspect there is a new soft tissue component involving the seventh rib. Electronically Signed   By: Marijo Sanes M.D.   On: 10/07/2019 16:29     ASSESSMENT:  1.  Kappa light chain myeloma, high risk, stage III: -CyBorD from 06/17/2018 through 04/04/2019 with progression. -He was evaluated for bone marrow [facility] at [facility] but has refused it. -Carfilzomib, pomalidomide  and dexamethasone from 04/21/2019. -Myeloma panel from 08/08/2019 shows M spike 0.1 g.  Previously this was not detectable.  Kappa light chains are 561, lambda light chains 36.9 and ratio 15.2.  Ratio was previously 11.2 and prior to that 16.1. -We had to held his carfilzomib day 15 and 16 of his last cycle because of elevated creatinine.  2.  Back pain: -MRI of the lumbar and thoracic spine on 04/20/2019 showed ventral epidural tumor posterior to the T12 vertebral body with mild spinal stenosis. -XRT to the back from 05/22/2019 through 05/27/2019. -MRI of the thoracic and lumbar spine on 10/06/2019 shows T7, T10, T11 and T12 lesions are slightly better seen on the study as the prior study was degraded by motion artifact.  No new findings.  Overall grossly stable appearing myelomatous changes at T12, L2 and L3 with no progressive findings.  Right seventh and ninth posterior rib lesions are probably stable.  Right seventh posterior rib lesion may be larger or just better seen.  There does appear to be a new adjacent soft tissue component.   PLAN:  1.  Kappa  light chain myeloma: -Myeloma labs from 10/04/2019 shows SPEP without M spike.  However kappa light chains are 2660 and ratio is 204. -His light chains have been going up and down.  At this point is not clear due to disease progression or volatile kidney function. -We will continue current treatment at this time.  He will start Pomalyst tonight.  I will reevaluate him in 4 weeks with repeat labs.  2.  Back pain: -He reports pain is better.  He takes Dilaudid 4 mg every 4 hours as needed. -He has slight exacerbation of the pain when weather gets colder or rains.  3.  Anemia: -This is from combination of CKD and myeloma.  Hemoglobin today is 9.7.  He will receive Retacrit as needed.  4.  Myeloma bone disease: -Continue denosumab monthly.  5.  ID/thromboprophylaxis: -Continue Valtrex and aspirin.   Orders placed this encounter:  No orders of the defined types were placed in this encounter.    Derek Jack, MD [place] [phone]   I, Milinda Antis, am acting as a scribe for Dr. Sanda Linger.  I, Derek Jack MD, have reviewed the above documentation for accuracy and completeness, and I agree with the above.

## 2019-10-17 NOTE — Progress Notes (Signed)
Patient has been assessed, vital signs and labs have been reviewed by Dr. Delton Coombes. ANC, Creatinine, LFTs, and Platelets are within treatment parameters per, decadron dose increased Dr. Delton Coombes. The patient is good to proceed with treatment at this time.

## 2019-10-17 NOTE — Progress Notes (Signed)
1120 Labs reviewed with and pt seen by Dr. Delton Coombes and pt approved for Kyprolis infusion as well as Delton See and Retacrit injections today with decadron dose increase per MD                    [name] tolerated Kyprolis infusion and Xgeva and Retacrit injection well without complaints or incident. Hgb 9.7 today. Calcium 9.9 and pt denied any tooth or jaw pain and no recent or future dental visits prior to administering the Xgeva injection. VSS upon discharge. Pt discharged self ambulatory in satisfactory condition

## 2019-10-17 NOTE — Patient Instructions (Signed)
[facility] at [facility] Discharge Instructions  Labs drawn from portacath today   Thank you for choosing  Cancer Center at [facility] to provide your oncology and hematology care.  To afford each patient quality time with our provider, please arrive at least 15 minutes before your scheduled appointment time.   If you have a lab appointment with the Cancer Center please come in thru the Main Entrance and check in at the main information desk.  You need to re-schedule your appointment should you arrive 10 or more minutes late.  We strive to give you quality time with our providers, and arriving late affects you and other patients whose appointments are after yours.  Also, if you no show three or more times for appointments you may be dismissed from the clinic at the providers discretion.     Again, thank you for choosing [facility].  Our hope is that these requests will decrease the amount of time that you wait before being seen by our physicians.       _____________________________________________________________  Should you have questions after your visit to [facility], please contact our office at [phone] between the hours of 8:00 a.m. and 4:30 p.m.  Voicemails left after 4:00 p.m. will not be returned until the following business day.  For prescription refill requests, have your pharmacy contact our office and allow 72 hours.    Due to Covid, you will need to wear a mask upon entering the hospital. If you do not have a mask, a mask will be given to you at the Main Entrance upon arrival. For doctor visits, patients may have 1 support person with them. For treatment visits, patients can not have anyone with them due to social distancing guidelines and our immunocompromised population.     

## 2019-10-17 NOTE — Patient Instructions (Signed)
[facility] at [facility] Discharge Instructions  You were seen today by Dr. Katragadda. He went over your recent results and scans. You received your treatment today. Dr. Katragadda will see you back in 4 weeks for labs and follow up.   Thank you for choosing [facility] at [facility] to provide your oncology and hematology care.  To afford each patient quality time with our provider, please arrive at least 15 minutes before your scheduled appointment time.   If you have a lab appointment with the Cancer Center please come in thru the Main Entrance and check in at the main information desk  You need to re-schedule your appointment should you arrive 10 or more minutes late.  We strive to give you quality time with our providers, and arriving late affects you and other patients whose appointments are after yours.  Also, if you no show three or more times for appointments you may be dismissed from the clinic at the providers discretion.     Again, thank you for choosing [facility].  Our hope is that these requests will decrease the amount of time that you wait before being seen by our physicians.       _____________________________________________________________  Should you have questions after your visit to [facility], please contact our office at [phone] between the hours of 8:00 a.m. and 4:30 p.m.  Voicemails left after 4:00 p.m. will not be returned until the following business day.  For prescription refill requests, have your pharmacy contact our office and allow 72 hours.    Cancer Center Support Programs:   > Cancer Support Group  2nd Tuesday of the month 1pm-2pm, Journey Room    

## 2019-10-17 NOTE — Patient Instructions (Signed)
[facility] Discharge Instructions for Patients Receiving Chemotherapy   Beginning January 23rd 2017 lab work for the [facility] will be done in the  Main lab at [facility] on 1st floor. If you have a lab appointment with the [facility] please come in thru the  Main Entrance and check in at the main information desk   Today you received the following chemotherapy agents Kyprolis. Follow-up as scheduled  To help prevent nausea and vomiting after your treatment, we encourage you to take your nausea medication   If you develop nausea and vomiting, or diarrhea that is not controlled by your medication, call the clinic.  The clinic phone number is [phone]. Office hours are Monday-Friday 8:30am-5:00pm.  BELOW ARE SYMPTOMS THAT SHOULD BE REPORTED IMMEDIATELY:  *FEVER GREATER THAN 101.0 F  *CHILLS WITH OR WITHOUT FEVER  NAUSEA AND VOMITING THAT IS NOT CONTROLLED WITH YOUR NAUSEA MEDICATION  *UNUSUAL SHORTNESS OF BREATH  *UNUSUAL BRUISING OR BLEEDING  TENDERNESS IN MOUTH AND THROAT WITH OR WITHOUT PRESENCE OF ULCERS  *URINARY PROBLEMS  *BOWEL PROBLEMS  UNUSUAL RASH Items with * indicate a potential emergency and should be followed up as soon as possible. If you have an emergency after office hours please contact your primary care physician or go to the nearest emergency department.  Please call the clinic during office hours if you have any questions or concerns.   You may also contact the Patient Navigator at [phone] should you have any questions or need assistance in obtaining follow up care.      Resources For Cancer Patients and their Caregivers ? American Cancer Society: Can assist with transportation, wigs, general needs, runs Look Good Feel Better.        (307) 218-9329 ? Cancer Care: Provides financial assistance, online support groups, medication/co-pay assistance.  1-800-813-HOPE (276)014-2961) ? [place]  Assists [place] cancer patients and their families through emotional , educational and financial support.  (772)392-8101 ? [place] DSS Where to apply for food stamps, Medicaid and utility assistance. 218 292 2165 ? [employer]: Transportation to medical appointments. 607-582-4468 ? Social Security Administration: May apply for disability if have a Stage IV cancer. 331-591-7316 (817)567-7239 ? [employer], Disability and Transit Services: Assists with nutrition, care and transit needs. 631-527-0211

## 2019-10-18 ENCOUNTER — Encounter (HOSPITAL_COMMUNITY): Payer: Self-pay

## 2019-10-18 ENCOUNTER — Inpatient Hospital Stay (HOSPITAL_COMMUNITY): Payer: Medicare Other

## 2019-10-18 VITALS — BP 144/60 | HR 72 | Temp 97.8°F | Resp 18

## 2019-10-18 DIAGNOSIS — Z5112 Encounter for antineoplastic immunotherapy: Secondary | ICD-10-CM | POA: Diagnosis not present

## 2019-10-18 DIAGNOSIS — C9000 Multiple myeloma not having achieved remission: Secondary | ICD-10-CM

## 2019-10-18 MED ORDER — SODIUM CHLORIDE 0.9 % IV SOLN: Freq: Once | INTRAVENOUS | Status: AC

## 2019-10-18 MED ORDER — ONDANSETRON HCL 4 MG/2ML IJ SOLN
4.0000 mg | Freq: Once | INTRAMUSCULAR | Status: AC
  Administered 2019-10-18: 4 mg via INTRAVENOUS
  Filled 2019-10-18: qty 2

## 2019-10-18 MED ORDER — SODIUM CHLORIDE 0.9% FLUSH
10.0000 mL | INTRAVENOUS | Status: DC | PRN
  Administered 2019-10-18: 10 mL

## 2019-10-18 MED ORDER — DEXTROSE 5 % IV SOLN
27.0000 mg/m2 | Freq: Once | INTRAVENOUS | Status: AC
  Administered 2019-10-18: 56 mg via INTRAVENOUS
  Filled 2019-10-18: qty 28

## 2019-10-18 MED ORDER — HEPARIN SOD (PORK) LOCK FLUSH 100 UNIT/ML IV SOLN
500.0000 [IU] | Freq: Once | INTRAVENOUS | Status: AC | PRN
  Administered 2019-10-18: 500 [IU]

## 2019-10-18 NOTE — Progress Notes (Signed)
[name] tolerated [name] infusion well without complaints or incident. VSS upon discharge. Pt discharged self ambulatory in satisfactory condition

## 2019-10-18 NOTE — Patient Instructions (Signed)
[facility] Discharge Instructions for Patients Receiving Chemotherapy   Beginning January 23rd 2017 lab work for the [facility] will be done in the  Main lab at [facility] on 1st floor. If you have a lab appointment with the [employer] please come in thru the  Main Entrance and check in at the main information desk   Today you received the following chemotherapy agents Kyprolis. Follow-up as scheduled  To help prevent nausea and vomiting after your treatment, we encourage you to take your nausea medication   If you develop nausea and vomiting, or diarrhea that is not controlled by your medication, call the clinic.  The clinic phone number is [phone]. Office hours are Monday-Friday 8:30am-5:00pm.  BELOW ARE SYMPTOMS THAT SHOULD BE REPORTED IMMEDIATELY:  *FEVER GREATER THAN 101.0 F  *CHILLS WITH OR WITHOUT FEVER  NAUSEA AND VOMITING THAT IS NOT CONTROLLED WITH YOUR NAUSEA MEDICATION  *UNUSUAL SHORTNESS OF BREATH  *UNUSUAL BRUISING OR BLEEDING  TENDERNESS IN MOUTH AND THROAT WITH OR WITHOUT PRESENCE OF ULCERS  *URINARY PROBLEMS  *BOWEL PROBLEMS  UNUSUAL RASH Items with * indicate a potential emergency and should be followed up as soon as possible. If you have an emergency after office hours please contact your primary care physician or go to the nearest emergency department.  Please call the clinic during office hours if you have any questions or concerns.   You may also contact the Patient Navigator at [phone] should you have any questions or need assistance in obtaining follow up care.      Resources For Cancer Patients and their Caregivers ? American Cancer Society: Can assist with transportation, wigs, general needs, runs Look Good Feel Better.        909-198-7606 ? Cancer Care: Provides financial assistance, online support groups, medication/co-pay assistance.  1-800-813-HOPE 646-727-0432) ? [place] Assists [place] cancer patients and their families through emotional , educational and financial support.  (414) 367-4778 ? [place] DSS Where to apply for food stamps, Medicaid and utility assistance. (330)395-9225 ? [employer]: Transportation to medical appointments. (281)569-1583 ? Social Security Administration: May apply for disability if have a Stage IV cancer. 602-444-1486 (646)313-9485 ? [employer], Disability and Transit Services: Assists with nutrition, care and transit needs. 8306870450

## 2019-10-19 ENCOUNTER — Encounter (HOSPITAL_COMMUNITY): Payer: Self-pay | Admitting: *Deleted

## 2019-10-24 ENCOUNTER — Encounter (HOSPITAL_COMMUNITY): Payer: Self-pay

## 2019-10-24 ENCOUNTER — Inpatient Hospital Stay (HOSPITAL_COMMUNITY): Payer: Medicare Other

## 2019-10-24 ENCOUNTER — Other Ambulatory Visit: Payer: Self-pay

## 2019-10-24 VITALS — BP 126/74 | HR 68 | Resp 18

## 2019-10-24 DIAGNOSIS — C9000 Multiple myeloma not having achieved remission: Secondary | ICD-10-CM

## 2019-10-24 DIAGNOSIS — N185 Chronic kidney disease, stage 5: Secondary | ICD-10-CM

## 2019-10-24 DIAGNOSIS — Z5112 Encounter for antineoplastic immunotherapy: Secondary | ICD-10-CM | POA: Diagnosis not present

## 2019-10-24 LAB — CBC WITH DIFFERENTIAL/PLATELET
Abs Immature Granulocytes: 0.06 10*3/uL (ref 0.00–0.07)
Basophils Absolute: 0 10*3/uL (ref 0.0–0.1)
Basophils Relative: 1 %
Eosinophils Absolute: 0.2 10*3/uL (ref 0.0–0.5)
Eosinophils Relative: 7 %
HCT: 28 % — ABNORMAL LOW (ref 39.0–52.0)
Hemoglobin: 9.4 g/dL — ABNORMAL LOW (ref 13.0–17.0)
Immature Granulocytes: 2 %
Lymphocytes Relative: 16 %
Lymphs Abs: 0.5 10*3/uL — ABNORMAL LOW (ref 0.7–4.0)
MCH: 40.7 pg — ABNORMAL HIGH (ref 26.0–34.0)
MCHC: 33.6 g/dL (ref 30.0–36.0)
MCV: 121.2 fL — ABNORMAL HIGH (ref 80.0–100.0)
Monocytes Absolute: 0.6 10*3/uL (ref 0.1–1.0)
Monocytes Relative: 19 %
Neutro Abs: 1.7 10*3/uL (ref 1.7–7.7)
Neutrophils Relative %: 55 %
Platelets: 86 10*3/uL — ABNORMAL LOW (ref 150–400)
RBC: 2.31 MIL/uL — ABNORMAL LOW (ref 4.22–5.81)
RDW: 16.9 % — ABNORMAL HIGH (ref 11.5–15.5)
WBC: 3.1 10*3/uL — ABNORMAL LOW (ref 4.0–10.5)
nRBC: 0 % (ref 0.0–0.2)

## 2019-10-24 LAB — COMPREHENSIVE METABOLIC PANEL
ALT: 14 U/L (ref 0–44)
AST: 16 U/L (ref 15–41)
Albumin: 3.8 g/dL (ref 3.5–5.0)
Alkaline Phosphatase: 57 U/L (ref 38–126)
Anion gap: 13 (ref 5–15)
BUN: 51 mg/dL — ABNORMAL HIGH (ref 8–23)
CO2: 21 mmol/L — ABNORMAL LOW (ref 22–32)
Calcium: 9.5 mg/dL (ref 8.9–10.3)
Chloride: 104 mmol/L (ref 98–111)
Creatinine, Ser: 6.53 mg/dL — ABNORMAL HIGH (ref 0.61–1.24)
GFR calc Af Amer: 10 mL/min — ABNORMAL LOW (ref >60)
GFR calc non Af Amer: 8 mL/min — ABNORMAL LOW (ref >60)
Glucose, Bld: 105 mg/dL — ABNORMAL HIGH (ref 70–99)
Potassium: 4.4 mmol/L (ref 3.5–5.1)
Sodium: 138 mmol/L (ref 135–145)
Total Bilirubin: 1.1 mg/dL (ref 0.3–1.2)
Total Protein: 6 g/dL — ABNORMAL LOW (ref 6.5–8.1)

## 2019-10-24 MED ORDER — SODIUM CHLORIDE 0.9 % IV SOLN
40.0000 mg | Freq: Once | INTRAVENOUS | Status: AC
  Administered 2019-10-24: 40 mg via INTRAVENOUS
  Filled 2019-10-24: qty 4

## 2019-10-24 MED ORDER — EPOETIN ALFA-EPBX 10000 UNIT/ML IJ SOLN
20000.0000 [IU] | Freq: Once | INTRAMUSCULAR | Status: AC
  Administered 2019-10-24: 20000 [IU] via SUBCUTANEOUS
  Filled 2019-10-24: qty 2

## 2019-10-24 MED ORDER — SODIUM CHLORIDE 0.9 % IV SOLN: Freq: Once | INTRAVENOUS | Status: AC

## 2019-10-24 MED ORDER — DEXTROSE 5 % IV SOLN
27.0000 mg/m2 | Freq: Once | INTRAVENOUS | Status: AC
  Administered 2019-10-24: 56 mg via INTRAVENOUS
  Filled 2019-10-24: qty 28

## 2019-10-24 MED ORDER — HEPARIN SOD (PORK) LOCK FLUSH 100 UNIT/ML IV SOLN
500.0000 [IU] | Freq: Once | INTRAVENOUS | Status: AC | PRN
  Administered 2019-10-24: 500 [IU]

## 2019-10-24 MED ORDER — ONDANSETRON HCL 4 MG/2ML IJ SOLN
4.0000 mg | Freq: Once | INTRAMUSCULAR | Status: AC
  Administered 2019-10-24: 4 mg via INTRAVENOUS
  Filled 2019-10-24: qty 2

## 2019-10-24 MED ORDER — SODIUM CHLORIDE 0.9% FLUSH: 10.0000 mL | INTRAVENOUS | Status: DC | PRN

## 2019-10-24 NOTE — Patient Instructions (Signed)
[facility] Discharge Instructions  Labs drawn from portacath today   Thank you for choosing [facility] at [facility] to provide your oncology and hematology care.  To afford each patient quality time with our provider, please arrive at least 15 minutes before your scheduled appointment time.   If you have a lab appointment with the Cancer Center please come in thru the Main Entrance and check in at the main information desk.  You need to re-schedule your appointment should you arrive 10 or more minutes late.  We strive to give you quality time with our providers, and arriving late affects you and other patients whose appointments are after yours.  Also, if you no show three or more times for appointments you may be dismissed from the clinic at the providers discretion.     Again, thank you for choosing [facility].  Our hope is that these requests will decrease the amount of time that you wait before being seen by our physicians.       _____________________________________________________________  Should you have questions after your visit to [facility], please contact our office at [phone] and follow the prompts.  Our office hours are 8:00 a.m. and 4:30 p.m. Monday - Friday.  Please note that voicemails left after 4:00 p.m. may not be returned until the following business day.  We are closed weekends and major holidays.  You do have access to a nurse 24-7, just call the main number to the clinic [phone] and do not press any options, hold on the line and a nurse will answer the phone.    For prescription refill requests, have your pharmacy contact our office and allow 72 hours.    Due to Covid, you will need to wear a mask upon entering the hospital. If you do not have a mask, a mask will be given to you at the Main Entrance upon arrival. For doctor visits, patients may have 1 support person age 18  or older with them. For treatment visits, patients can not have anyone with them due to social distancing guidelines and our immunocompromised population.     

## 2019-10-24 NOTE — Patient Instructions (Signed)
[facility] Discharge Instructions for Patients Receiving Chemotherapy  Today you received the following chemotherapy agents   To help prevent nausea and vomiting after your treatment, we encourage you to take your nausea medication   If you develop nausea and vomiting that is not controlled by your nausea medication, call the clinic.   BELOW ARE SYMPTOMS THAT SHOULD BE REPORTED IMMEDIATELY:  *FEVER GREATER THAN 100.5 F  *CHILLS WITH OR WITHOUT FEVER  NAUSEA AND VOMITING THAT IS NOT CONTROLLED WITH YOUR NAUSEA MEDICATION  *UNUSUAL SHORTNESS OF BREATH  *UNUSUAL BRUISING OR BLEEDING  TENDERNESS IN MOUTH AND THROAT WITH OR WITHOUT PRESENCE OF ULCERS  *URINARY PROBLEMS  *BOWEL PROBLEMS  UNUSUAL RASH Items with * indicate a potential emergency and should be followed up as soon as possible.  Feel free to call the clinic should you have any questions or concerns. The clinic phone number is [phone].  Please show the CHEMO ALERT CARD at check-in to the Emergency Department and triage nurse.   

## 2019-10-24 NOTE — Progress Notes (Signed)
Patient is taking pomalyst and has not missed any doses and reports no side effects at this time.   Labs Reviewed with Dr Raliegh Ip.  Creatinine 6.53 and BUN 53 Platelets 86.  Okay to proceed with treatment.   Shalev Canion's reason for visit today is for an injection and labs as scheduled per MD orders.  [name] also received reatcrit 20,000 units per MD orders; see [facility] for administration details.  Hernando Reali tolerated all procedures well and without incident.  [name] tolerated kyprolis well today without incident.   Vital signs WNL.  Discharged ambulatory.  Will return tomorrow for day 9.

## 2019-10-25 ENCOUNTER — Encounter (HOSPITAL_COMMUNITY): Payer: Self-pay

## 2019-10-25 ENCOUNTER — Inpatient Hospital Stay (HOSPITAL_COMMUNITY): Payer: Medicare Other

## 2019-10-25 VITALS — BP 125/67 | HR 63 | Temp 97.5°F | Resp 18

## 2019-10-25 DIAGNOSIS — Z5112 Encounter for antineoplastic immunotherapy: Secondary | ICD-10-CM | POA: Diagnosis not present

## 2019-10-25 DIAGNOSIS — C9000 Multiple myeloma not having achieved remission: Secondary | ICD-10-CM

## 2019-10-25 MED ORDER — SODIUM CHLORIDE 0.9% FLUSH
10.0000 mL | INTRAVENOUS | Status: DC | PRN
  Administered 2019-10-25 (×2): 10 mL

## 2019-10-25 MED ORDER — SODIUM CHLORIDE 0.9 % IV SOLN: Freq: Once | INTRAVENOUS | Status: AC

## 2019-10-25 MED ORDER — HEPARIN SOD (PORK) LOCK FLUSH 100 UNIT/ML IV SOLN
500.0000 [IU] | Freq: Once | INTRAVENOUS | Status: AC | PRN
  Administered 2019-10-25: 500 [IU]

## 2019-10-25 MED ORDER — ONDANSETRON HCL 4 MG/2ML IJ SOLN
4.0000 mg | Freq: Once | INTRAMUSCULAR | Status: AC
  Administered 2019-10-25: 4 mg via INTRAVENOUS
  Filled 2019-10-25: qty 2

## 2019-10-25 MED ORDER — DEXTROSE 5 % IV SOLN
27.0000 mg/m2 | Freq: Once | INTRAVENOUS | Status: AC
  Administered 2019-10-25: 56 mg via INTRAVENOUS
  Filled 2019-10-25: qty 28

## 2019-10-25 NOTE — Progress Notes (Signed)
Patient tolerated chemotherapy with no complaints voiced.  Side effects with management reviewed with understanding verbalized.  Port site clean and dry with no bruising or swelling noted at site.  Good blood return noted before and after administration of chemotherapy.  Band aid applied.  Patient left in satisfactory condition with VSS and no s/s of distress noted.   

## 2019-10-27 ENCOUNTER — Encounter (HOSPITAL_COMMUNITY): Payer: Self-pay | Admitting: Nurse Practitioner

## 2019-10-27 ENCOUNTER — Other Ambulatory Visit (HOSPITAL_COMMUNITY): Payer: Self-pay | Admitting: Nurse Practitioner

## 2019-10-27 ENCOUNTER — Encounter (HOSPITAL_COMMUNITY): Payer: Self-pay | Admitting: *Deleted

## 2019-10-31 ENCOUNTER — Telehealth (HOSPITAL_COMMUNITY): Payer: Self-pay | Admitting: Nurse Practitioner

## 2019-10-31 ENCOUNTER — Inpatient Hospital Stay (HOSPITAL_COMMUNITY): Payer: Medicare Other | Attending: Hematology

## 2019-10-31 ENCOUNTER — Inpatient Hospital Stay (HOSPITAL_COMMUNITY): Payer: Medicare Other

## 2019-10-31 ENCOUNTER — Other Ambulatory Visit: Payer: Self-pay

## 2019-10-31 VITALS — BP 130/75 | HR 66 | Temp 97.8°F | Resp 18

## 2019-10-31 DIAGNOSIS — Z5112 Encounter for antineoplastic immunotherapy: Secondary | ICD-10-CM | POA: Diagnosis not present

## 2019-10-31 DIAGNOSIS — Z452 Encounter for adjustment and management of vascular access device: Secondary | ICD-10-CM | POA: Diagnosis not present

## 2019-10-31 DIAGNOSIS — N185 Chronic kidney disease, stage 5: Secondary | ICD-10-CM

## 2019-10-31 DIAGNOSIS — N189 Chronic kidney disease, unspecified: Secondary | ICD-10-CM | POA: Insufficient documentation

## 2019-10-31 DIAGNOSIS — D631 Anemia in chronic kidney disease: Secondary | ICD-10-CM | POA: Insufficient documentation

## 2019-10-31 DIAGNOSIS — I493 Ventricular premature depolarization: Secondary | ICD-10-CM | POA: Insufficient documentation

## 2019-10-31 DIAGNOSIS — C9000 Multiple myeloma not having achieved remission: Secondary | ICD-10-CM

## 2019-10-31 LAB — CBC WITH DIFFERENTIAL/PLATELET
Abs Immature Granulocytes: 0.03 10*3/uL (ref 0.00–0.07)
Basophils Absolute: 0 10*3/uL (ref 0.0–0.1)
Basophils Relative: 1 %
Eosinophils Absolute: 0.3 10*3/uL (ref 0.0–0.5)
Eosinophils Relative: 9 %
HCT: 27.8 % — ABNORMAL LOW (ref 39.0–52.0)
Hemoglobin: 9.2 g/dL — ABNORMAL LOW (ref 13.0–17.0)
Immature Granulocytes: 1 %
Lymphocytes Relative: 16 %
Lymphs Abs: 0.5 10*3/uL — ABNORMAL LOW (ref 0.7–4.0)
MCH: 40.5 pg — ABNORMAL HIGH (ref 26.0–34.0)
MCHC: 33.1 g/dL (ref 30.0–36.0)
MCV: 122.5 fL — ABNORMAL HIGH (ref 80.0–100.0)
Monocytes Absolute: 1 10*3/uL (ref 0.1–1.0)
Monocytes Relative: 29 %
Neutro Abs: 1.5 10*3/uL — ABNORMAL LOW (ref 1.7–7.7)
Neutrophils Relative %: 44 %
Platelets: 120 10*3/uL — ABNORMAL LOW (ref 150–400)
RBC: 2.27 MIL/uL — ABNORMAL LOW (ref 4.22–5.81)
RDW: 17 % — ABNORMAL HIGH (ref 11.5–15.5)
WBC: 3.4 10*3/uL — ABNORMAL LOW (ref 4.0–10.5)
nRBC: 0 % (ref 0.0–0.2)

## 2019-10-31 LAB — COMPREHENSIVE METABOLIC PANEL
ALT: 14 U/L (ref 0–44)
AST: 16 U/L (ref 15–41)
Albumin: 3.5 g/dL (ref 3.5–5.0)
Alkaline Phosphatase: 55 U/L (ref 38–126)
Anion gap: 10 (ref 5–15)
BUN: 58 mg/dL — ABNORMAL HIGH (ref 8–23)
CO2: 20 mmol/L — ABNORMAL LOW (ref 22–32)
Calcium: 9.2 mg/dL (ref 8.9–10.3)
Chloride: 108 mmol/L (ref 98–111)
Creatinine, Ser: 6.21 mg/dL — ABNORMAL HIGH (ref 0.61–1.24)
GFR calc Af Amer: 10 mL/min — ABNORMAL LOW (ref >60)
GFR calc non Af Amer: 9 mL/min — ABNORMAL LOW (ref >60)
Glucose, Bld: 109 mg/dL — ABNORMAL HIGH (ref 70–99)
Potassium: 4.9 mmol/L (ref 3.5–5.1)
Sodium: 138 mmol/L (ref 135–145)
Total Bilirubin: 0.5 mg/dL (ref 0.3–1.2)
Total Protein: 5.3 g/dL — ABNORMAL LOW (ref 6.5–8.1)

## 2019-10-31 MED ORDER — SODIUM CHLORIDE 0.9% FLUSH
10.0000 mL | INTRAVENOUS | Status: DC | PRN
  Administered 2019-10-31: 10 mL

## 2019-10-31 MED ORDER — SODIUM CHLORIDE 0.9 % IV SOLN: Freq: Once | INTRAVENOUS | Status: AC

## 2019-10-31 MED ORDER — DEXTROSE 5 % IV SOLN
27.0000 mg/m2 | Freq: Once | INTRAVENOUS | Status: AC
  Administered 2019-10-31: 56 mg via INTRAVENOUS
  Filled 2019-10-31: qty 28

## 2019-10-31 MED ORDER — SODIUM CHLORIDE 0.9 % IV SOLN
40.0000 mg | Freq: Once | INTRAVENOUS | Status: AC
  Administered 2019-10-31: 40 mg via INTRAVENOUS
  Filled 2019-10-31: qty 4

## 2019-10-31 MED ORDER — ONDANSETRON HCL 4 MG/2ML IJ SOLN
4.0000 mg | Freq: Once | INTRAMUSCULAR | Status: AC
  Administered 2019-10-31: 4 mg via INTRAVENOUS
  Filled 2019-10-31: qty 2

## 2019-10-31 MED ORDER — HEPARIN SOD (PORK) LOCK FLUSH 100 UNIT/ML IV SOLN
500.0000 [IU] | Freq: Once | INTRAVENOUS | Status: AC | PRN
  Administered 2019-10-31: 500 [IU]

## 2019-10-31 MED ORDER — EPOETIN ALFA-EPBX 10000 UNIT/ML IJ SOLN
20000.0000 [IU] | Freq: Once | INTRAMUSCULAR | Status: AC
  Administered 2019-10-31: 20000 [IU] via SUBCUTANEOUS
  Filled 2019-10-31: qty 2

## 2019-10-31 NOTE — Progress Notes (Signed)
Treatment given today per MD orders. Tolerated infusion without adverse affects. Vital signs stable. No complaints at this time. Discharged from clinic ambulatory. F/U with [facility] as scheduled.   

## 2019-10-31 NOTE — Patient Instructions (Signed)
[facility] at [facility]  Discharge Instructions:   _______________________________________________________________  Thank you for choosing [facility] at [facility] to provide your oncology and hematology care.  To afford each patient quality time with our providers, please arrive at least 15 minutes before your scheduled appointment.  You need to re-schedule your appointment if you arrive 10 or more minutes late.  We strive to give you quality time with our providers, and arriving late affects you and other patients whose appointments are after yours.  Also, if you no show three or more times for appointments you may be dismissed from the clinic.  Again, thank you for choosing  Cancer Center at [facility]. Our hope is that these requests will allow you access to exceptional care and in a timely manner. _______________________________________________________________  If you have questions after your visit, please contact our office at [phone] between the hours of 8:30 a.m. and 5:00 p.m. Voicemails left after 4:30 p.m. will not be returned until the following business day. _______________________________________________________________  For prescription refill requests, have your pharmacy contact our office. _______________________________________________________________  Recommendations made by the consultant and any test results will be sent to your referring physician. _______________________________________________________________ 

## 2019-10-31 NOTE — Progress Notes (Signed)
Labs reviewed today, no change since last time. Kidney function unchanged, will treat per MD.

## 2019-11-01 ENCOUNTER — Inpatient Hospital Stay (HOSPITAL_COMMUNITY): Payer: Medicare Other

## 2019-11-01 VITALS — BP 141/74 | HR 77 | Temp 97.1°F | Resp 18

## 2019-11-01 DIAGNOSIS — C9000 Multiple myeloma not having achieved remission: Secondary | ICD-10-CM

## 2019-11-01 DIAGNOSIS — Z5112 Encounter for antineoplastic immunotherapy: Secondary | ICD-10-CM | POA: Diagnosis not present

## 2019-11-01 MED ORDER — ONDANSETRON HCL 4 MG/2ML IJ SOLN
4.0000 mg | Freq: Once | INTRAMUSCULAR | Status: AC
  Administered 2019-11-01: 4 mg via INTRAVENOUS

## 2019-11-01 MED ORDER — SODIUM CHLORIDE 0.9 % IV SOLN: Freq: Once | INTRAVENOUS | Status: AC

## 2019-11-01 MED ORDER — DEXTROSE 5 % IV SOLN
27.0000 mg/m2 | Freq: Once | INTRAVENOUS | Status: AC
  Administered 2019-11-01: 56 mg via INTRAVENOUS
  Filled 2019-11-01: qty 28

## 2019-11-01 MED ORDER — ONDANSETRON HCL 4 MG/2ML IJ SOLN
INTRAMUSCULAR | Status: AC
  Filled 2019-11-01: qty 2

## 2019-11-01 MED ORDER — SODIUM CHLORIDE 0.9% FLUSH
10.0000 mL | INTRAVENOUS | Status: DC | PRN
  Administered 2019-11-01: 10 mL

## 2019-11-01 MED ORDER — HEPARIN SOD (PORK) LOCK FLUSH 100 UNIT/ML IV SOLN
500.0000 [IU] | Freq: Once | INTRAVENOUS | Status: AC | PRN
  Administered 2019-11-01: 500 [IU]

## 2019-11-01 NOTE — Patient Instructions (Signed)
[facility] Discharge Instructions for Patients Receiving Chemotherapy  Today you received the following chemotherapy agents   To help prevent nausea and vomiting after your treatment, we encourage you to take your nausea medication   If you develop nausea and vomiting that is not controlled by your nausea medication, call the clinic.   BELOW ARE SYMPTOMS THAT SHOULD BE REPORTED IMMEDIATELY:  *FEVER GREATER THAN 100.5 F  *CHILLS WITH OR WITHOUT FEVER  NAUSEA AND VOMITING THAT IS NOT CONTROLLED WITH YOUR NAUSEA MEDICATION  *UNUSUAL SHORTNESS OF BREATH  *UNUSUAL BRUISING OR BLEEDING  TENDERNESS IN MOUTH AND THROAT WITH OR WITHOUT PRESENCE OF ULCERS  *URINARY PROBLEMS  *BOWEL PROBLEMS  UNUSUAL RASH Items with * indicate a potential emergency and should be followed up as soon as possible.  Feel free to call the clinic should you have any questions or concerns. The clinic phone number is [phone].  Please show the CHEMO ALERT CARD at check-in to the Emergency Department and triage nurse.   

## 2019-11-01 NOTE — Progress Notes (Signed)
Patient presents today for treatment. Vital signs are stable. Patient has no complaints of any changes since yesterday's treatment.   Treatment given today per MD orders. Tolerated infusion without adverse affects. Vital signs stable. No complaints at this time. Discharged from clinic ambulatory. F/U with [facility] as scheduled.

## 2019-11-02 MED ORDER — PEGFILGRASTIM-JMDB 6 MG/0.6ML ~~LOC~~ SOSY
PREFILLED_SYRINGE | SUBCUTANEOUS | Status: AC
  Filled 2019-11-02: qty 0.6

## 2019-11-03 ENCOUNTER — Other Ambulatory Visit (HOSPITAL_COMMUNITY): Payer: Self-pay

## 2019-11-03 MED ORDER — HYDROMORPHONE HCL 4 MG PO TABS: 4.0000 mg | ORAL_TABLET | ORAL | 0 refills | Status: DC | PRN

## 2019-11-04 ENCOUNTER — Other Ambulatory Visit (HOSPITAL_COMMUNITY): Payer: Self-pay

## 2019-11-07 ENCOUNTER — Other Ambulatory Visit (HOSPITAL_COMMUNITY): Payer: Self-pay | Admitting: *Deleted

## 2019-11-07 DIAGNOSIS — C9000 Multiple myeloma not having achieved remission: Secondary | ICD-10-CM

## 2019-11-07 MED ORDER — POMALIDOMIDE 3 MG PO CAPS: 3.0000 mg | ORAL_CAPSULE | Freq: Every day | ORAL | 0 refills | Status: DC

## 2019-11-07 NOTE — Telephone Encounter (Signed)
Chart reviewed, pomalyst refilled.

## 2019-11-14 ENCOUNTER — Inpatient Hospital Stay (HOSPITAL_BASED_OUTPATIENT_CLINIC_OR_DEPARTMENT_OTHER): Payer: Medicare Other | Admitting: Hematology

## 2019-11-14 ENCOUNTER — Inpatient Hospital Stay (HOSPITAL_COMMUNITY): Payer: Medicare Other

## 2019-11-14 ENCOUNTER — Other Ambulatory Visit: Payer: Self-pay

## 2019-11-14 VITALS — BP 131/37 | HR 60 | Temp 98.6°F | Resp 18 | Wt 149.0 lb

## 2019-11-14 DIAGNOSIS — C9000 Multiple myeloma not having achieved remission: Secondary | ICD-10-CM

## 2019-11-14 DIAGNOSIS — N189 Chronic kidney disease, unspecified: Secondary | ICD-10-CM | POA: Diagnosis not present

## 2019-11-14 DIAGNOSIS — D631 Anemia in chronic kidney disease: Secondary | ICD-10-CM | POA: Diagnosis not present

## 2019-11-14 DIAGNOSIS — Z5112 Encounter for antineoplastic immunotherapy: Secondary | ICD-10-CM | POA: Diagnosis not present

## 2019-11-14 LAB — COMPREHENSIVE METABOLIC PANEL
ALT: 12 U/L (ref 0–44)
AST: 18 U/L (ref 15–41)
Albumin: 3.8 g/dL (ref 3.5–5.0)
Alkaline Phosphatase: 58 U/L (ref 38–126)
Anion gap: 10 (ref 5–15)
BUN: 55 mg/dL — ABNORMAL HIGH (ref 8–23)
CO2: 24 mmol/L (ref 22–32)
Calcium: 9.8 mg/dL (ref 8.9–10.3)
Chloride: 99 mmol/L (ref 98–111)
Creatinine, Ser: 7.18 mg/dL — ABNORMAL HIGH (ref 0.61–1.24)
GFR calc Af Amer: 9 mL/min — ABNORMAL LOW (ref >60)
GFR calc non Af Amer: 7 mL/min — ABNORMAL LOW (ref >60)
Glucose, Bld: 87 mg/dL (ref 70–99)
Potassium: 4.6 mmol/L (ref 3.5–5.1)
Sodium: 133 mmol/L — ABNORMAL LOW (ref 135–145)
Total Bilirubin: 0.9 mg/dL (ref 0.3–1.2)
Total Protein: 6.3 g/dL — ABNORMAL LOW (ref 6.5–8.1)

## 2019-11-14 LAB — CBC WITH DIFFERENTIAL/PLATELET
Abs Immature Granulocytes: 0.05 10*3/uL (ref 0.00–0.07)
Basophils Absolute: 0 10*3/uL (ref 0.0–0.1)
Basophils Relative: 1 %
Eosinophils Absolute: 0.1 10*3/uL (ref 0.0–0.5)
Eosinophils Relative: 2 %
HCT: 27.1 % — ABNORMAL LOW (ref 39.0–52.0)
Hemoglobin: 9.3 g/dL — ABNORMAL LOW (ref 13.0–17.0)
Lymphocytes Relative: 33 %
Lymphs Abs: 1.5 10*3/uL (ref 0.7–4.0)
MCH: 42.7 pg — ABNORMAL HIGH (ref 26.0–34.0)
MCHC: 34.3 g/dL (ref 30.0–36.0)
MCV: 124.3 fL — ABNORMAL HIGH (ref 80.0–100.0)
Monocytes Absolute: 0.8 10*3/uL (ref 0.1–1.0)
Monocytes Relative: 18 %
Neutro Abs: 2 10*3/uL (ref 1.7–7.7)
Neutrophils Relative %: 46 %
Platelets: 169 10*3/uL (ref 150–400)
RBC: 2.18 MIL/uL — ABNORMAL LOW (ref 4.22–5.81)
RDW: 16 % — ABNORMAL HIGH (ref 11.5–15.5)
WBC: 4.4 10*3/uL (ref 4.0–10.5)
nRBC: 0 % (ref 0.0–0.2)

## 2019-11-14 LAB — LACTATE DEHYDROGENASE: LDH: 233 U/L — ABNORMAL HIGH (ref 98–192)

## 2019-11-14 LAB — MAGNESIUM: Magnesium: 2.2 mg/dL (ref 1.7–2.4)

## 2019-11-14 MED ORDER — HEPARIN SOD (PORK) LOCK FLUSH 100 UNIT/ML IV SOLN
500.0000 [IU] | Freq: Once | INTRAVENOUS | Status: AC
  Administered 2019-11-14: 500 [IU] via INTRAVENOUS

## 2019-11-14 MED ORDER — SODIUM CHLORIDE 0.9% FLUSH
10.0000 mL | Freq: Once | INTRAVENOUS | Status: AC
  Administered 2019-11-14: 10 mL via INTRAVENOUS

## 2019-11-14 NOTE — Progress Notes (Signed)
[place], [place] [address]   CLINIC:  Medical Oncology/Hematology  PCP:  Rosita Fire, MD [place] / [place]  [id number] [phone]   REASON FOR VISIT:  Follow-up for kappa light chain myeloma  PRIOR THERAPY: CyBorD from 06/17/2018 to 04/04/2019.  NGS Results: Not applicable.  CURRENT THERAPY: Carfilzomib & pomalidomide  BRIEF ONCOLOGIC HISTORY:  Oncology History  Kappa light chain myeloma ([name])  06/16/2018 Initial Diagnosis   Kappa light chain myeloma ([name])   06/17/2018 - 04/10/2019 Chemotherapy   The patient had palonosetron (ALOXI) injection 0.25 mg, 0.25 mg, Intravenous,  Once, 27 of 27 cycles Administration: 0.25 mg (06/17/2018), 0.25 mg (06/23/2018), 0.25 mg (06/30/2018), 0.25 mg (07/07/2018), 0.25 mg (07/14/2018), 0.25 mg (07/22/2018), 0.25 mg (07/29/2018), 0.25 mg (08/06/2018), 0.25 mg (08/13/2018), 0.25 mg (08/20/2018), 0.25 mg (08/27/2018), 0.25 mg (09/06/2018), 0.25 mg (09/13/2018), 0.25 mg (09/20/2018), 0.25 mg (09/27/2018), 0.25 mg (10/04/2018), 0.25 mg (10/11/2018), 0.25 mg (10/18/2018), 0.25 mg (10/25/2018), 0.25 mg (11/01/2018), 0.25 mg (11/08/2018), 0.25 mg (11/15/2018), 0.25 mg (11/22/2018), 0.25 mg (11/29/2018), 0.25 mg (12/08/2018), 0.25 mg (12/16/2018) bortezomib SQ (VELCADE) chemo injection 2.5 mg, 1.3 mg/m2 = 2.5 mg, Subcutaneous,  Once, 41 of 42 cycles Administration: 2.5 mg (06/17/2018), 2.5 mg (06/23/2018), 2.5 mg (06/30/2018), 2.5 mg (07/07/2018), 2.5 mg (07/14/2018), 2.5 mg (07/22/2018), 2.5 mg (07/29/2018), 2.5 mg (08/06/2018), 2.5 mg (08/13/2018), 2.5 mg (08/20/2018), 2.5 mg (08/27/2018), 2.5 mg (09/06/2018), 2.5 mg (09/13/2018), 2.5 mg (09/20/2018), 2.5 mg (09/27/2018), 2.5 mg (10/04/2018), 2.5 mg (10/11/2018), 2.5 mg (10/18/2018), 2.5 mg (10/25/2018), 2.5 mg (11/01/2018), 2.5 mg (11/08/2018), 2.5 mg (11/15/2018), 2.5 mg (11/22/2018), 2.5 mg (11/29/2018), 2.5 mg (12/08/2018), 2.5 mg (12/16/2018), 2.5 mg (12/27/2018), 2.5 mg (01/03/2019), 2.5 mg (01/10/2019), 2.5 mg  (01/17/2019), 2.5 mg (01/24/2019), 2.5 mg (01/31/2019), 2.5 mg (02/07/2019), 2.5 mg (02/14/2019), 2.5 mg (02/21/2019), 2.5 mg (02/28/2019), 2.5 mg (03/07/2019), 2.5 mg (03/14/2019), 2.5 mg (03/21/2019), 2.5 mg (03/29/2019), 2.5 mg (04/04/2019) cyclophosphamide (CYTOXAN) 300 mg in sodium chloride 0.9 % 250 mL chemo infusion, 300 mg (100 % of original dose 300 mg), Intravenous,  Once, 26 of 26 cycles Dose modification: 300 mg (original dose 300 mg, Cycle 1, Reason: Change in SCr/CrCl), 500 mg (original dose 500 mg, Cycle 2, Reason: Provider Judgment), 500 mg (original dose 500 mg, Cycle 3, Reason: Change in SCr/CrCl) Administration: 300 mg (06/17/2018), 500 mg (06/23/2018), 500 mg (06/30/2018), 600 mg (07/07/2018), 600 mg (07/14/2018), 600 mg (07/22/2018), 600 mg (07/29/2018), 600 mg (08/06/2018), 600 mg (08/13/2018), 600 mg (08/20/2018), 600 mg (08/27/2018), 600 mg (09/06/2018), 600 mg (09/13/2018), 600 mg (09/20/2018), 600 mg (09/27/2018), 600 mg (10/04/2018), 600 mg (10/11/2018), 600 mg (10/18/2018), 600 mg (10/25/2018), 600 mg (11/01/2018), 600 mg (11/08/2018), 600 mg (11/15/2018), 600 mg (11/22/2018), 600 mg (11/29/2018), 600 mg (12/08/2018), 600 mg (12/16/2018)  for chemotherapy treatment.    04/21/2019 -  Chemotherapy   The patient had ondansetron (ZOFRAN) injection 4 mg, 4 mg (100 % of original dose 4 mg), Intravenous,  Once, 6 of 7 cycles Dose modification: 8 mg (original dose 4 mg, Cycle 2), 4 mg (original dose 4 mg, Cycle 2), 4 mg (original dose 4 mg, Cycle 2) Administration: 4 mg (06/09/2019), 4 mg (06/10/2019), 4 mg (06/23/2019), 4 mg (06/24/2019), 4 mg (07/04/2019), 4 mg (07/05/2019), 4 mg (07/11/2019), 4 mg (07/12/2019), 4 mg (08/22/2019), 4 mg (08/23/2019), 4 mg (08/30/2019), 4 mg (08/31/2019), 4 mg (09/06/2019), 4 mg (09/07/2019), 4 mg (07/25/2019), 4 mg (07/26/2019), 4 mg (08/01/2019), 4 mg (08/02/2019), 4  mg (09/19/2019), 4 mg (09/20/2019), 4 mg (09/26/2019), 4 mg (09/27/2019), 4 mg (10/04/2019), 4 mg (10/05/2019), 4 mg (10/17/2019), 4 mg (10/18/2019), 4 mg  (10/24/2019), 4 mg (10/25/2019), 4 mg (10/31/2019), 4 mg (11/01/2019) carfilzomib (KYPROLIS) 40 mg in dextrose 5 % 50 mL chemo infusion, 20 mg/m2 = 40 mg, Intravenous,  Once, 7 of 8 cycles Dose modification: 27 mg/m2 (original dose 27 mg/m2, Cycle 1, Reason: Provider Judgment) Administration: 40 mg (04/21/2019), 40 mg (04/22/2019), 60 mg (04/28/2019), 60 mg (04/29/2019), 60 mg (05/05/2019), 60 mg (05/06/2019), 56 mg (05/26/2019), 56 mg (05/27/2019), 56 mg (06/02/2019), 56 mg (06/03/2019), 56 mg (06/09/2019), 56 mg (06/10/2019), 56 mg (08/22/2019), 56 mg (08/23/2019), 56 mg (08/30/2019), 56 mg (08/31/2019), 56 mg (09/06/2019), 56 mg (09/07/2019), 56 mg (06/23/2019), 56 mg (06/24/2019), 56 mg (07/04/2019), 56 mg (07/05/2019), 56 mg (07/11/2019), 56 mg (07/12/2019), 56 mg (07/25/2019), 56 mg (07/26/2019), 56 mg (08/01/2019), 56 mg (08/02/2019), 56 mg (09/19/2019), 56 mg (09/20/2019), 56 mg (09/26/2019), 56 mg (09/27/2019), 56 mg (10/04/2019), 56 mg (10/05/2019), 56 mg (10/17/2019), 56 mg (10/18/2019), 56 mg (10/24/2019), 56 mg (10/25/2019), 56 mg (10/31/2019), 56 mg (11/01/2019)  for chemotherapy treatment.      CANCER STAGING: Cancer Staging No matching staging information was found for the patient.  INTERVAL HISTORY:  [name], a 59 y.o. male, returns for routine follow-up and consideration for next cycle of chemotherapy. [name] was last seen on 10/17/2019.  Due for cycle #8 of carfilzomib today.   Overall, he tells me he has been feeling pretty well. His appetite has been severely decreased. His pleuritic chest pain is worsening and has been persistent for the entire week. He takes 1 tablet of Dilaudid every 4 hours for the pain and reports that the pain relieve lasts only 3 hours. He has been taking Demadex for the past 1 week for his leg swelling. He will start taking Pomalyst tonight.  He received his first COVID vaccine on 6/27 and reports having a headache and numbness and tingling in his feet for several days.  Overall, he feels ready for  next cycle of chemo today.    REVIEW OF SYSTEMS:  Review of Systems  Constitutional: Positive for appetite change (severely decreased) and fatigue (severe).  Respiratory: Positive for cough.   Cardiovascular: Positive for chest pain (8/10 rib and R shoulder pain), leg swelling and palpitations.  Gastrointestinal: Positive for constipation and nausea.  Neurological: Positive for headaches.  Psychiatric/Behavioral: Positive for depression.  All other systems reviewed and are negative.   PAST MEDICAL/SURGICAL HISTORY:  Past Medical History:  Diagnosis Date  . Allergy   . Arthritis    neck and back  . Blood transfusion without reported diagnosis   . Bone metastases ([place]) 09/19/2019  . BPH (benign prostatic hyperplasia)   . Cancer [facility]) 2004   testicle  . Chronic kidney disease    kidney stones  . Left lumbar radiculopathy 06/12/2016  . 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 Medical history non-contributory    Pt has scattered thoughts and uncertain of past medical history  . 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 Stones, urinary tract   . Substance abuse ([place])    prescribed oxydcodone- 10 years   Past Surgical History:  Procedure Laterality Date  . BACK SURGERY     x5  . CERVICAL DISC SURGERY     x2  . COLONOSCOPY WITH PROPOFOL N/A 08/11/2016   Dr. Gala Romney: non-bleeding internal hemorrhoids, one 4 mm hyperplastic rectal polyp, diverticulosis in entire examined colon  .  POLYPECTOMY  08/11/2016   Procedure: POLYPECTOMY;  Surgeon: [name], MD;  Location: [facility] ENDO SUITE;  Service: Endoscopy;;  colon  . PORTACATH PLACEMENT Left 09/20/2018   Procedure: INSERTION PORT-A-CATH (catheter attached left subclavian);  Surgeon: Virl Cagey, MD;  Location: [facility] ORS;  Service: General;  Laterality: Left;  . testicular cancer  2004  . TONSILLECTOMY      SOCIAL HISTORY:  Social History   Socioeconomic History  . Marital status: Single    Spouse name: Not on file  . Number of  children: 1  . Years of education: 20  . Highest education level: Not on file  Occupational History  . Occupation: retired    Comment: Psychologist, prison and probation services  . Occupation: disabled  Tobacco Use  . Smoking status: Current Every Day Smoker    Packs/day: 1.00    Years: 40.00    Pack years: 40.00    Types: Cigarettes    Start date: 03/31/1974  . Smokeless tobacco: Never Used  Vaping Use  . Vaping Use: Never used  Substance and Sexual Activity  . Alcohol use: Yes    Comment: Occasional  . Drug use: No    Types: Cocaine    Comment: Remote hx of cocaine, quit 1989  . Sexual activity: Yes  Other Topics Concern  . Not on file  Social History Narrative   [employer] for 12 years   [employer] for 24 years      Never married   One daughter      Lives alone   Retail banker   Right-handed   Occasional caffeine use         Social Determinants of Health   Financial Resource Strain:   . Difficulty of Paying Living Expenses:   Food Insecurity:   . Worried About Charity fundraiser in the Last Year:   . Arboriculturist in the Last Year:   Transportation Needs:   . Film/video editor (Medical):   Marland Kitchen Lack of Transportation (Non-Medical):   Physical Activity:   . Days of Exercise per Week:   . Minutes of Exercise per Session:   Stress:   . Feeling of Stress :   Social Connections:   . Frequency of Communication with Friends and Family:   . Frequency of Social Gatherings with Friends and Family:   . Attends Religious Services:   . Active Member of Clubs or Organizations:   . Attends Archivist Meetings:   Marland Kitchen Marital Status:   Intimate Partner Violence:   . Fear of Current or Ex-Partner:   . Emotionally Abused:   Marland Kitchen Physically Abused:   . Sexually Abused:     FAMILY HISTORY:  Family History  Problem Relation Age of Onset  . COPD Mother   . Heart disease Mother   . Other Father        Never knew his father.  . Thyroid disease Sister   . Arthritis Sister    . Heart disease Sister        bypass  . Colon cancer Neg Hx     CURRENT MEDICATIONS:  Current Outpatient Medications  Medication Sig Dispense Refill  . aspirin EC 81 MG tablet Take 81 mg by mouth daily.    . calcitRIOL (ROCALTROL) 0.5 MCG capsule Take 0.5 mcg by mouth daily.     Marland Kitchen CARFILZOMIB IV Inject into the vein.    Marland Kitchen Epoetin Alfa (PROCRIT IJ) Inject as directed.  Unsure of dosage- gets this once weekly    . HYDROmorphone (DILAUDID) 4 MG tablet Take 1 tablet (4 mg total) by mouth every 4 (four) hours as needed for severe pain. 126 tablet 0  . Misc. Devices MISC Please provide patient with rollaider.  Dx: multiple myeloma C90.0 1 each 99  . Patiromer Sorbitex Calcium (VELTASSA PO) Take by mouth 2 (two) times a week.     . pomalidomide (POMALYST) 3 MG capsule Take 1 capsule (3 mg total) by mouth daily. 21 capsule 0  . senna (SENOKOT) 8.6 MG tablet Take 1 tablet by mouth 2 (two) times daily.     . sodium bicarbonate 650 MG tablet Take 1,300 mg by mouth 2 (two) times daily.    . valACYclovir (VALTREX) 500 MG tablet TAKE 1 TABLET BY MOUTH TWICE A DAY 180 tablet 2  . prochlorperazine (COMPAZINE) 5 MG tablet Take 1 tablet (5 mg total) by mouth every 6 (six) hours as needed for nausea or vomiting. (Patient not taking: Reported on 11/14/2019) 30 tablet 2  . torsemide (DEMADEX) 20 MG tablet TAKING 1 TABLET DAILY, AND 2 TABLETS ONLY IF HIS ANKLES ARE REALLY SWOLLEN (Patient not taking: Reported on 11/14/2019) 180 tablet 1   No current facility-administered medications for this visit.    ALLERGIES:  Allergies  Allergen Reactions  . Oxycodone-Acetaminophen   . Acetaminophen Nausea Only and Other (See Comments)    Elevated liver enzymes  . Cymbalta [Duloxetine Hcl] Swelling    Facial swelling  . Diclofenac Sodium Swelling       . Diclofenac Sodium Swelling  . Imodium [Loperamide] Swelling    facial  . Lyrica [Pregabalin] Swelling  . Morphine Other (See Comments)    Bradycardia   .  Vioxx [Rofecoxib] Swelling  . Aleve [Naproxen] Swelling    eye    PHYSICAL EXAM:  Performance status (ECOG): 1 - Symptomatic but completely ambulatory  Vitals:   11/14/19 1043  BP: (!) 131/37  Pulse: 60  Resp: 18  Temp: 98.6 F (37 C)  SpO2: 100%   Wt Readings from Last 3 Encounters:  11/14/19 149 lb 0.5 oz (67.6 kg)  10/31/19 162 lb 6.4 oz (73.7 kg)  10/24/19 158 lb (71.7 kg)   Physical Exam Vitals reviewed.  Constitutional:      Appearance: Normal appearance.  Cardiovascular:     Rate and Rhythm: Normal rate. Rhythm irregular.     Pulses: Normal pulses.     Heart sounds: Normal heart sounds.  Pulmonary:     Effort: Pulmonary effort is normal.     Breath sounds: Normal breath sounds.  Chest:     Chest wall: Tenderness (R anterior ribs TTP) present.     Comments: Port-a-Cath on L chest Musculoskeletal:     Thoracic back: Bony tenderness (R posterior ribs TTP) present.     Right lower leg: No edema.     Left lower leg: No edema.  Neurological:     General: No focal deficit present.     Mental Status: He is alert and oriented to person, place, and time.  Psychiatric:        Mood and Affect: Mood normal.        Behavior: Behavior normal.     LABORATORY DATA:  I have reviewed the labs as listed.  CBC Latest Ref Rng & Units 11/14/2019 10/31/2019 10/24/2019  WBC 4.0 - 10.5 K/uL 4.4 3.4(L) 3.1(L)  Hemoglobin 13.0 - 17.0 g/dL 9.3(L) 9.2(L) 9.4(L)  Hematocrit 39 -  52 % 27.1(L) 27.8(L) 28.0(L)  Platelets 150 - 400 K/uL 169 120(L) 86(L)   CMP Latest Ref Rng & Units 11/14/2019 10/31/2019 10/24/2019  Glucose 70 - 99 mg/dL 87 109(H) 105(H)  BUN 8 - 23 mg/dL 55(H) 58(H) 51(H)  Creatinine 0.61 - 1.24 mg/dL 7.18(H) 6.21(H) 6.53(H)  Sodium 135 - 145 mmol/L 133(L) 138 138  Potassium 3.5 - 5.1 mmol/L 4.6 4.9 4.4  Chloride 98 - 111 mmol/L 99 108 104  CO2 22 - 32 mmol/L 24 20(L) 21(L)  Calcium 8.9 - 10.3 mg/dL 9.8 9.2 9.5  Total Protein 6.5 - 8.1 g/dL 6.3(L) 5.3(L) 6.0(L)  Total  Bilirubin 0.3 - 1.2 mg/dL 0.9 0.5 1.1  Alkaline Phos 38 - 126 U/L 58 55 57  AST 15 - 41 U/L 18 16 16   ALT 0 - 44 U/L 12 14 14    Lab Results  Component Value Date   LDH 233 (H) 11/14/2019   LDH 154 10/04/2019   LDH 152 08/30/2019   Lab Results  Component Value Date   TOTALPROTELP 5.2 (L) 10/04/2019   ALBUMINELP 3.5 10/04/2019   A1GS 0.1 10/04/2019   A2GS 0.5 10/04/2019   BETS 0.7 10/04/2019   GAMS 0.4 10/04/2019   MSPIKE Not Observed 10/04/2019   SPEI Comment 10/04/2019    Lab Results  Component Value Date   KPAFRELGTCHN 2,660.3 (H) 10/04/2019   LAMBDASER 13.0 10/04/2019   KAPLAMBRATIO 204.64 (H) 10/04/2019    DIAGNOSTIC IMAGING:  I have independently reviewed the scans and discussed with the patient. No results found.   ASSESSMENT:  1. Kappa light chain myeloma, high risk, stage III: -CyBorD from 06/17/2018 through 04/04/2019 with progression. -He was evaluated for bone marrow transplant at [facility] but has refused it. -Carfilzomib, pomalidomide and dexamethasone from 04/21/2019. -Myeloma panel from 08/08/2019 shows M spike 0.1 g. Previously this was not detectable. Kappa light chains are 561, lambda light chains 36.9 and ratio 15.2. Ratio was previously 11.2 and prior to that 16.1. -We had to held his carfilzomib day 15 and 16 of his last cycle because of elevated creatinine.  2.  Back pain: -MRI of the lumbar and thoracic spine on 04/20/2019 showed ventral epidural tumor posterior to the T12 vertebral body with mild spinal stenosis. -XRT to the back from 05/22/2019 through 05/27/2019. -MRI of the thoracic and lumbar spine on 10/06/2019 shows T7, T10, T11 and T12 lesions are slightly better seen on the study as the prior study was degraded by motion artifact.  No new findings.  Overall grossly stable appearing myelomatous changes at T12, L2 and L3 with no progressive findings.  Right seventh and ninth posterior rib lesions are probably stable.  Right seventh  posterior rib lesion may be larger or just better seen.  There does appear to be a new adjacent soft tissue component.   PLAN:  1. Kappa light chain myeloma: -Myeloma labs on 10/04/2019 with no M spike.  However kappa light chains have gone up to 2660 and ratio of 204.  This is continually going up. -His creatinine also got slightly worse today.  I have recommended change in therapy. -We will discontinue carfilzomib.  We will consider daratumumab, pomalidomide and dexamethasone. -We discussed the side effects of daratumumab in detail.  We will plan for subcutaneous daratumumab regimen. -We will follow up on myeloma labs from today.  2. Back pain: -He reported worsening of the right lateral rib pain.  He is taking Dilaudid 4 mg every 4 hours as needed.  Dilaudid effect is not lasting long.  I have reviewed his MRI of the thoracic and lumbar spine from 10/06/2019. -I have recommended him to take Dilaudid 4 mg every 3 hours.  3. 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 -Hemoglobin today is 9.3.  Continue Retacrit as needed.  4. Myeloma bone disease: -Continue monthly denosumab.  5. ID/thromboprophylaxis: -Continue Valtrex and aspirin.  6.  Irregular heart rate: -I have done an EKG in the office.  QTC is prolonged.  Frequent PVCs noted. -His electrolytes including potassium and magnesium were normal. -He does not report any chest pain or shortness of breath.  Will make a referral to cardiology.   Orders placed this encounter:  No orders of the defined types were placed in this encounter.  Total time spent is 40 minutes with more than 70% of the time spent face-to-face discussing myeloma results, change in treatment plan, side effects, counseling and coordination of care.  Derek Jack, MD [place] [phone]   I, Milinda Antis, am acting as a scribe for Dr. Sanda Linger.  I, Derek Jack MD, have reviewed the above  documentation for accuracy and completeness, and I agree with the above.

## 2019-11-14 NOTE — Progress Notes (Signed)
Dr. Delton Coombes has seen the patient today. He is going to change his treatment. He will stop the Kyprolis and change to Darzalex Faspro.  He will continue taking the pomalyst. Dr. Delton Coombes gave orders for patient to have EKG today. Primary RN aware. Pharmacy aware of drug changes.

## 2019-11-14 NOTE — Progress Notes (Signed)
DISCONTINUE ON PATHWAY REGIMEN - Multiple Myeloma and Other Plasma Cell Dyscrasias     A cycle is every 28 days:     Carfilzomib      Carfilzomib      Carfilzomib      Pomalidomide      Dexamethasone   **Always confirm dose/schedule in your pharmacy ordering system**  REASON: Disease Progression PRIOR TREATMENT: [id number]: KPd (Once Weekly Carfilzomib 20/27 mg/m2 + Pomalidomide + Dexamethasone) q28 Days Until Progression or Unacceptable Toxicity TREATMENT RESPONSE: Progressive Disease (PD)  START ON PATHWAY REGIMEN - Multiple Myeloma and Other Plasma Cell Dyscrasias     Cycles 1 and 2: A cycle is every 28 days:     Pomalidomide      Dexamethasone      Daratumumab and hyaluronidase-fihj    Cycles 3 through 6: A cycle is every 28 days:     Pomalidomide      Dexamethasone      Dexamethasone      Daratumumab and hyaluronidase-fihj    Cycles 7 and beyond: A cycle is every 28 days:     Pomalidomide      Dexamethasone      Dexamethasone      Daratumumab and hyaluronidase-fihj   **Always confirm dose/schedule in your pharmacy ordering system**  Patient Characteristics: Multiple Myeloma, Relapsed / Refractory, Second through Fourth Lines of Therapy Disease Classification: Multiple Myeloma R-ISS Staging: III Therapeutic Status: Relapsed Line of Therapy: Third Line Intent of Therapy: Non-Curative / Palliative Intent, Discussed with Patient

## 2019-11-14 NOTE — Progress Notes (Signed)
Serum creatinine 7.18 for today's treatment.  Patient to see oncologist for follow up visit today.   No treatment today.  EKG to be done before leaving the clinic.  See oncology note.

## 2019-11-14 NOTE — Patient Instructions (Signed)
[place] [facility] Discharge Instructions  You were seen today by Dr. Delton Coombes. He went over your recent results. Start taking the Dilaudid every 3 hours for the chest pain. Your chemotherapy treatment will be changed to Darzalex; the most common reaction to receiving this medication is allergic reaction (skin rashes, difficulty breathing, etc), generally during the first 3 treatments. You can proceed with your next COVID vaccine. Dr. Delton Coombes will see you back in 2 weeks for labs and follow up.   Thank you for choosing [facility] to provide your oncology and hematology care.  To afford each patient quality time with our provider, please arrive at least 15 minutes before your scheduled appointment time.   If you have a lab appointment with the [facility] please come in thru the Main Entrance and check in at the main information desk  You need to re-schedule your appointment should you arrive 10 or more minutes late.  We strive to give you quality time with our providers, and arriving late affects you and other patients whose appointments are after yours.  Also, if you no show three or more times for appointments you may be dismissed from the clinic at the providers discretion.     Again, thank you for choosing [facility].  Our hope is that these requests will decrease the amount of time that you wait before being seen by our physicians.       _____________________________________________________________  Should you have questions after your visit to [facility], please contact our office at [phone] between the hours of 8:00 a.m. and 4:30 p.m.  Voicemails left after 4:00 p.m. will not be returned until the following business day.  For prescription refill requests, have your pharmacy contact our office and allow 72 hours.    Cancer Center Support Programs:   > Cancer Support Group  2nd  Tuesday of the month 1pm-2pm, Journey Room

## 2019-11-15 ENCOUNTER — Encounter (HOSPITAL_COMMUNITY): Payer: Self-pay

## 2019-11-15 ENCOUNTER — Ambulatory Visit (HOSPITAL_COMMUNITY): Payer: Medicare Other

## 2019-11-15 LAB — PROTEIN ELECTROPHORESIS, SERUM
A/G Ratio: 1.5 (ref 0.7–1.7)
Albumin ELP: 3.7 g/dL (ref 2.9–4.4)
Alpha-1-Globulin: 0.2 g/dL (ref 0.0–0.4)
Alpha-2-Globulin: 0.6 g/dL (ref 0.4–1.0)
Beta Globulin: 0.9 g/dL (ref 0.7–1.3)
Gamma Globulin: 0.7 g/dL (ref 0.4–1.8)
Globulin, Total: 2.4 g/dL (ref 2.2–3.9)
M-Spike, %: 0.2 g/dL — ABNORMAL HIGH
Total Protein ELP: 6.1 g/dL (ref 6.0–8.5)

## 2019-11-15 LAB — KAPPA/LAMBDA LIGHT CHAINS
Kappa free light chain: 9724.4 mg/L — ABNORMAL HIGH (ref 3.3–19.4)
Kappa, lambda light chain ratio: 558.87 — ABNORMAL HIGH (ref 0.26–1.65)
Lambda free light chains: 17.4 mg/L (ref 5.7–26.3)

## 2019-11-15 NOTE — Progress Notes (Signed)
I spoke with the patient's daughter, [name]. I gave her an update on his recent lab work, changes to the treatment plan and changes to pain medication. I encouraged [name] to encourage the patient to reach out to the clinic with any concerns.

## 2019-11-17 ENCOUNTER — Other Ambulatory Visit (HOSPITAL_COMMUNITY): Payer: Self-pay | Admitting: *Deleted

## 2019-11-17 DIAGNOSIS — C9000 Multiple myeloma not having achieved remission: Secondary | ICD-10-CM

## 2019-11-17 NOTE — Progress Notes (Signed)
.   Pharmacist Chemotherapy Monitoring - Initial Assessment    Anticipated start date: 11/21/19   Regimen:  . Are orders appropriate based on the patient's diagnosis, regimen, and cycle? Yes . Does the plan date match the patient's scheduled date? Yes . Is the sequencing of drugs appropriate? Yes . Are the premedications appropriate for the patient's regimen? Yes . Prior Authorization for treatment is: Pending o If applicable, is the correct biosimilar selected based on the patient's insurance? not applicable  Organ Function and Labs: Marland Kitchen Are dose adjustments needed based on the patient's renal function, hepatic function, or hematologic function? No . Are appropriate labs ordered prior to the start of patient's treatment? Yes . Other organ system assessment, if indicated: N/A . The following baseline labs, if indicated, have been ordered: daratumumab: blood type and screen  Dose Assessment: . Are the drug doses appropriate? Yes . Are the following correct: o Drug concentrations Yes o IV fluid compatible with drug Yes o Administration routes Yes o Timing of therapy Yes . If applicable, does the patient have documented access for treatment and/or plans for port-a-cath placement? not applicable . If applicable, have lifetime cumulative doses been properly documented and assessed? not applicable Lifetime Dose Tracking  No doses have been documented on this patient for the following tracked chemicals: Doxorubicin, Epirubicin, Idarubicin, Daunorubicin, Mitoxantrone, Bleomycin, Oxaliplatin, Carboplatin, Liposomal Doxorubicin  o   Toxicity Monitoring/Prevention: . The patient has the following take home antiemetics prescribed: N/A . The patient has the following take home medications prescribed: N/A . Medication allergies and previous infusion related reactions, if applicable, have been reviewed and addressed. Yes . The patient's current medication list has been assessed for drug-drug  interactions with their chemotherapy regimen. no significant drug-drug interactions were identified on review.  Order Review: . Are the treatment plan orders signed? No . Is the patient scheduled to see a provider prior to their treatment? Yes  I verify that I have reviewed each item in the above checklist and answered each question accordingly.  [name] 11/17/2019 3:44 PM

## 2019-11-18 MED ORDER — DEXAMETHASONE 4 MG PO TABS: 40.0000 mg | ORAL_TABLET | ORAL | 6 refills | Status: DC

## 2019-11-21 ENCOUNTER — Inpatient Hospital Stay (HOSPITAL_COMMUNITY): Payer: Medicare Other

## 2019-11-21 ENCOUNTER — Encounter (HOSPITAL_COMMUNITY): Payer: Self-pay

## 2019-11-21 ENCOUNTER — Ambulatory Visit (HOSPITAL_COMMUNITY): Payer: Medicare Other

## 2019-11-21 ENCOUNTER — Other Ambulatory Visit: Payer: Self-pay

## 2019-11-21 ENCOUNTER — Other Ambulatory Visit (HOSPITAL_COMMUNITY): Payer: Medicare Other

## 2019-11-21 VITALS — BP 129/54 | HR 71 | Temp 98.0°F | Resp 17 | Wt 150.1 lb

## 2019-11-21 DIAGNOSIS — N185 Chronic kidney disease, stage 5: Secondary | ICD-10-CM

## 2019-11-21 DIAGNOSIS — C9000 Multiple myeloma not having achieved remission: Secondary | ICD-10-CM

## 2019-11-21 DIAGNOSIS — Z5112 Encounter for antineoplastic immunotherapy: Secondary | ICD-10-CM | POA: Diagnosis not present

## 2019-11-21 LAB — COMPREHENSIVE METABOLIC PANEL
ALT: 12 U/L (ref 0–44)
AST: 16 U/L (ref 15–41)
Albumin: 3.5 g/dL (ref 3.5–5.0)
Alkaline Phosphatase: 77 U/L (ref 38–126)
Anion gap: 11 (ref 5–15)
BUN: 67 mg/dL — ABNORMAL HIGH (ref 8–23)
CO2: 22 mmol/L (ref 22–32)
Calcium: 9.4 mg/dL (ref 8.9–10.3)
Chloride: 99 mmol/L (ref 98–111)
Creatinine, Ser: 7.74 mg/dL — ABNORMAL HIGH (ref 0.61–1.24)
GFR calc Af Amer: 8 mL/min — ABNORMAL LOW (ref >60)
GFR calc non Af Amer: 7 mL/min — ABNORMAL LOW (ref >60)
Glucose, Bld: 93 mg/dL (ref 70–99)
Potassium: 4.6 mmol/L (ref 3.5–5.1)
Sodium: 132 mmol/L — ABNORMAL LOW (ref 135–145)
Total Bilirubin: 0.7 mg/dL (ref 0.3–1.2)
Total Protein: 6.2 g/dL — ABNORMAL LOW (ref 6.5–8.1)

## 2019-11-21 LAB — TYPE AND SCREEN
ABO/RH(D): A POS
Antibody Screen: NEGATIVE

## 2019-11-21 LAB — CBC WITH DIFFERENTIAL/PLATELET
Abs Immature Granulocytes: 0.17 10*3/uL — ABNORMAL HIGH (ref 0.00–0.07)
Basophils Absolute: 0.1 10*3/uL (ref 0.0–0.1)
Basophils Relative: 3 %
Eosinophils Absolute: 0.4 10*3/uL (ref 0.0–0.5)
Eosinophils Relative: 10 %
HCT: 25.3 % — ABNORMAL LOW (ref 39.0–52.0)
Hemoglobin: 8.4 g/dL — ABNORMAL LOW (ref 13.0–17.0)
Immature Granulocytes: 5 %
Lymphocytes Relative: 14 %
Lymphs Abs: 0.5 10*3/uL — ABNORMAL LOW (ref 0.7–4.0)
MCH: 42 pg — ABNORMAL HIGH (ref 26.0–34.0)
MCHC: 33.2 g/dL (ref 30.0–36.0)
MCV: 126.5 fL — ABNORMAL HIGH (ref 80.0–100.0)
Monocytes Absolute: 0.5 10*3/uL (ref 0.1–1.0)
Monocytes Relative: 13 %
Neutro Abs: 2 10*3/uL (ref 1.7–7.7)
Neutrophils Relative %: 55 %
Platelets: 160 10*3/uL (ref 150–400)
RBC: 2 MIL/uL — ABNORMAL LOW (ref 4.22–5.81)
RDW: 15.1 % (ref 11.5–15.5)
WBC: 3.6 10*3/uL — ABNORMAL LOW (ref 4.0–10.5)
nRBC: 0 % (ref 0.0–0.2)

## 2019-11-21 MED ORDER — DARATUMUMAB-HYALURONIDASE-FIHJ 1800-30000 MG-UT/15ML ~~LOC~~ SOLN
1800.0000 mg | Freq: Once | SUBCUTANEOUS | Status: AC
  Administered 2019-11-21: 1800 mg via SUBCUTANEOUS
  Filled 2019-11-21: qty 15

## 2019-11-21 MED ORDER — MONTELUKAST SODIUM 10 MG PO TABS
10.0000 mg | ORAL_TABLET | Freq: Once | ORAL | Status: AC
  Administered 2019-11-21: 10 mg via ORAL

## 2019-11-21 MED ORDER — DENOSUMAB 120 MG/1.7ML ~~LOC~~ SOLN
SUBCUTANEOUS | Status: AC
  Filled 2019-11-21: qty 1.7

## 2019-11-21 MED ORDER — DEXAMETHASONE 4 MG PO TABS
20.0000 mg | ORAL_TABLET | Freq: Once | ORAL | Status: AC
  Administered 2019-11-21: 20 mg via ORAL

## 2019-11-21 MED ORDER — IBUPROFEN 200 MG PO TABS
ORAL_TABLET | ORAL | Status: AC
  Filled 2019-11-21: qty 1

## 2019-11-21 MED ORDER — DIPHENHYDRAMINE HCL 25 MG PO CAPS
50.0000 mg | ORAL_CAPSULE | Freq: Once | ORAL | Status: AC
  Administered 2019-11-21: 50 mg via ORAL

## 2019-11-21 MED ORDER — DENOSUMAB 120 MG/1.7ML ~~LOC~~ SOLN
120.0000 mg | Freq: Once | SUBCUTANEOUS | Status: AC
  Administered 2019-11-21: 120 mg via SUBCUTANEOUS
  Filled 2019-11-21: qty 1.7

## 2019-11-21 MED ORDER — FAMOTIDINE 20 MG PO TABS
ORAL_TABLET | ORAL | Status: AC
  Filled 2019-11-21: qty 1

## 2019-11-21 MED ORDER — FAMOTIDINE 20 MG PO TABS
20.0000 mg | ORAL_TABLET | Freq: Once | ORAL | Status: AC
  Administered 2019-11-21: 20 mg via ORAL

## 2019-11-21 MED ORDER — EPOETIN ALFA-EPBX 10000 UNIT/ML IJ SOLN
20000.0000 [IU] | Freq: Once | INTRAMUSCULAR | Status: AC
  Administered 2019-11-21: 20000 [IU] via SUBCUTANEOUS
  Filled 2019-11-21: qty 2

## 2019-11-21 MED ORDER — ACETAMINOPHEN 325 MG PO TABS: 650.0000 mg | ORAL_TABLET | Freq: Once | ORAL | Status: DC

## 2019-11-21 MED ORDER — SODIUM CHLORIDE 0.9% FLUSH
10.0000 mL | Freq: Once | INTRAVENOUS | Status: DC
  Administered 2019-11-21: 10 mL

## 2019-11-21 MED ORDER — HEPARIN SOD (PORK) LOCK FLUSH 100 UNIT/ML IV SOLN
500.0000 [IU] | Freq: Once | INTRAVENOUS | Status: DC
  Administered 2019-11-21: 500 [IU] via INTRAVENOUS

## 2019-11-21 MED ORDER — ACETAMINOPHEN 325 MG PO TABS
ORAL_TABLET | ORAL | Status: AC
  Filled 2019-11-21: qty 2

## 2019-11-21 MED ORDER — MONTELUKAST SODIUM 10 MG PO TABS
ORAL_TABLET | ORAL | Status: AC
  Filled 2019-11-21: qty 1

## 2019-11-21 MED ORDER — DIPHENHYDRAMINE HCL 25 MG PO CAPS
ORAL_CAPSULE | ORAL | Status: AC
  Filled 2019-11-21: qty 2

## 2019-11-21 MED ORDER — DEXAMETHASONE 4 MG PO TABS
ORAL_TABLET | ORAL | Status: AC
  Filled 2019-11-21: qty 5

## 2019-11-21 NOTE — Progress Notes (Signed)
Labs reviewed by Dr. Delton Coombes. Verbal order received to proceed with treatment.

## 2019-11-21 NOTE — Progress Notes (Signed)
Treatment given today per MD orders. Tolerated without adverse affects. Vital signs stable. No complaints at this time. Discharged from clinic ambulatory. F/U with [facility] as scheduled.

## 2019-11-21 NOTE — Addendum Note (Signed)
Addended by: Henreitta Leber E on: 11/21/2019 10:36 AM   Modules accepted: Orders

## 2019-11-21 NOTE — Patient Instructions (Signed)
[facility] Discharge Instructions for Patients Receiving Chemotherapy  Today you received the following chemotherapy agents   To help prevent nausea and vomiting after your treatment, we encourage you to take your nausea medication   If you develop nausea and vomiting that is not controlled by your nausea medication, call the clinic.   BELOW ARE SYMPTOMS THAT SHOULD BE REPORTED IMMEDIATELY:  *FEVER GREATER THAN 100.5 F  *CHILLS WITH OR WITHOUT FEVER  NAUSEA AND VOMITING THAT IS NOT CONTROLLED WITH YOUR NAUSEA MEDICATION  *UNUSUAL SHORTNESS OF BREATH  *UNUSUAL BRUISING OR BLEEDING  TENDERNESS IN MOUTH AND THROAT WITH OR WITHOUT PRESENCE OF ULCERS  *URINARY PROBLEMS  *BOWEL PROBLEMS  UNUSUAL RASH Items with * indicate a potential emergency and should be followed up as soon as possible.  Feel free to call the clinic should you have any questions or concerns. The clinic phone number is [phone].  Please show the CHEMO ALERT CARD at check-in to the Emergency Department and triage nurse.   

## 2019-11-21 NOTE — Addendum Note (Signed)
Addended by: Benjiman Core D on: 11/21/2019 11:13 AM   Modules accepted: Orders

## 2019-11-21 NOTE — Addendum Note (Signed)
Addended by: Derek Jack on: 11/21/2019 09:50 AM   Modules accepted: Orders

## 2019-11-21 NOTE — Addendum Note (Signed)
Addended by: Benjiman Core D on: 11/21/2019 10:11 AM   Modules accepted: Orders

## 2019-11-21 NOTE — Progress Notes (Signed)
Discontinue acetaminophen per MD due to allergy.    T.O. Dr Rhys Martini, PharmD

## 2019-11-21 NOTE — Progress Notes (Signed)
Patient presents today for treatment D1 Daratumumab [place]. Vital signs within parameters for treatment. Labs pending. Consent obtained. Patient denies any significant changes since his last visit. MAR reviewed and updated. Patient has back pain he rates a 7/10 chronic.

## 2019-11-21 NOTE — Progress Notes (Signed)
Tylenol 650 mg PO  discontinued per Dr. Delton Coombes. Give patient 200 mg PO Ibuprofen with treatment.

## 2019-11-22 ENCOUNTER — Other Ambulatory Visit (HOSPITAL_COMMUNITY): Payer: Self-pay | Admitting: *Deleted

## 2019-11-22 ENCOUNTER — Ambulatory Visit (HOSPITAL_COMMUNITY): Payer: Medicare Other

## 2019-11-22 LAB — PRETREATMENT RBC PHENOTYPE

## 2019-11-22 MED ORDER — HYDROMORPHONE HCL 4 MG PO TABS
4.0000 mg | ORAL_TABLET | ORAL | 0 refills | Status: DC | PRN
Start: 2019-11-22 — End: 2019-12-26

## 2019-11-22 NOTE — Progress Notes (Signed)
13:53 pm 24 hour call back. Patient denies nausea, vomiting, or diarrhea. Patient denies any fever or chills. Patient states his appetite is within normal limits with no concerns. Patient has increased his water intake per patient's words. Patient instructed to call the [facility] if any problems occur. Understanding verbalized.

## 2019-11-22 NOTE — Telephone Encounter (Signed)
Dr. Delton Coombes changed the dose of patient's pain medication to 1 tablet every 3 hours prn pain.  Orders sent to [facility] to sign.

## 2019-11-25 ENCOUNTER — Other Ambulatory Visit (HOSPITAL_COMMUNITY): Payer: Self-pay

## 2019-11-25 NOTE — Telephone Encounter (Signed)
Encounter opened to refill medication that was previously filled on 08/24.

## 2019-11-28 ENCOUNTER — Inpatient Hospital Stay (HOSPITAL_COMMUNITY): Payer: Medicare Other

## 2019-11-28 ENCOUNTER — Other Ambulatory Visit (HOSPITAL_COMMUNITY): Payer: Medicare Other

## 2019-11-28 ENCOUNTER — Inpatient Hospital Stay (HOSPITAL_BASED_OUTPATIENT_CLINIC_OR_DEPARTMENT_OTHER): Payer: Medicare Other | Admitting: Hematology

## 2019-11-28 ENCOUNTER — Ambulatory Visit (HOSPITAL_COMMUNITY): Payer: Medicare Other

## 2019-11-28 ENCOUNTER — Other Ambulatory Visit: Payer: Self-pay

## 2019-11-28 VITALS — BP 122/43 | HR 59 | Temp 98.9°F | Resp 18

## 2019-11-28 VITALS — Wt 153.0 lb

## 2019-11-28 DIAGNOSIS — C9000 Multiple myeloma not having achieved remission: Secondary | ICD-10-CM

## 2019-11-28 DIAGNOSIS — Z5112 Encounter for antineoplastic immunotherapy: Secondary | ICD-10-CM | POA: Diagnosis not present

## 2019-11-28 DIAGNOSIS — N185 Chronic kidney disease, stage 5: Secondary | ICD-10-CM

## 2019-11-28 LAB — COMPREHENSIVE METABOLIC PANEL
ALT: 15 U/L (ref 0–44)
AST: 18 U/L (ref 15–41)
Albumin: 3.5 g/dL (ref 3.5–5.0)
Alkaline Phosphatase: 70 U/L (ref 38–126)
Anion gap: 10 (ref 5–15)
BUN: 65 mg/dL — ABNORMAL HIGH (ref 8–23)
CO2: 19 mmol/L — ABNORMAL LOW (ref 22–32)
Calcium: 9.2 mg/dL (ref 8.9–10.3)
Chloride: 106 mmol/L (ref 98–111)
Creatinine, Ser: 6.43 mg/dL — ABNORMAL HIGH (ref 0.61–1.24)
GFR calc Af Amer: 10 mL/min — ABNORMAL LOW (ref >60)
GFR calc non Af Amer: 9 mL/min — ABNORMAL LOW (ref >60)
Glucose, Bld: 101 mg/dL — ABNORMAL HIGH (ref 70–99)
Potassium: 4.7 mmol/L (ref 3.5–5.1)
Sodium: 135 mmol/L (ref 135–145)
Total Bilirubin: 0.7 mg/dL (ref 0.3–1.2)
Total Protein: 5.8 g/dL — ABNORMAL LOW (ref 6.5–8.1)

## 2019-11-28 LAB — CBC WITH DIFFERENTIAL/PLATELET
Abs Immature Granulocytes: 0.08 10*3/uL — ABNORMAL HIGH (ref 0.00–0.07)
Basophils Absolute: 0.1 10*3/uL (ref 0.0–0.1)
Basophils Relative: 1 %
Eosinophils Absolute: 0.4 10*3/uL (ref 0.0–0.5)
Eosinophils Relative: 11 %
HCT: 25.1 % — ABNORMAL LOW (ref 39.0–52.0)
Hemoglobin: 8.3 g/dL — ABNORMAL LOW (ref 13.0–17.0)
Immature Granulocytes: 2 %
Lymphocytes Relative: 15 %
Lymphs Abs: 0.6 10*3/uL — ABNORMAL LOW (ref 0.7–4.0)
MCH: 42.1 pg — ABNORMAL HIGH (ref 26.0–34.0)
MCHC: 33.1 g/dL (ref 30.0–36.0)
MCV: 127.4 fL — ABNORMAL HIGH (ref 80.0–100.0)
Monocytes Absolute: 0.9 10*3/uL (ref 0.1–1.0)
Monocytes Relative: 25 %
Neutro Abs: 1.7 10*3/uL (ref 1.7–7.7)
Neutrophils Relative %: 46 %
Platelets: 139 10*3/uL — ABNORMAL LOW (ref 150–400)
RBC: 1.97 MIL/uL — ABNORMAL LOW (ref 4.22–5.81)
RDW: 15.7 % — ABNORMAL HIGH (ref 11.5–15.5)
WBC: 3.7 10*3/uL — ABNORMAL LOW (ref 4.0–10.5)
nRBC: 0.5 % — ABNORMAL HIGH (ref 0.0–0.2)

## 2019-11-28 MED ORDER — SODIUM CHLORIDE 0.9% FLUSH
10.0000 mL | Freq: Once | INTRAVENOUS | Status: AC
  Administered 2019-11-28: 10 mL via INTRAVENOUS

## 2019-11-28 MED ORDER — DIPHENHYDRAMINE HCL 25 MG PO CAPS
50.0000 mg | ORAL_CAPSULE | Freq: Once | ORAL | Status: AC
  Administered 2019-11-28: 50 mg via ORAL
  Filled 2019-11-28: qty 2

## 2019-11-28 MED ORDER — DARATUMUMAB-HYALURONIDASE-FIHJ 1800-30000 MG-UT/15ML ~~LOC~~ SOLN
1800.0000 mg | Freq: Once | SUBCUTANEOUS | Status: AC
  Administered 2019-11-28: 1800 mg via SUBCUTANEOUS
  Filled 2019-11-28: qty 15

## 2019-11-28 MED ORDER — DEXAMETHASONE 4 MG PO TABS
20.0000 mg | ORAL_TABLET | Freq: Once | ORAL | Status: AC
  Administered 2019-11-28: 20 mg via ORAL
  Filled 2019-11-28: qty 5

## 2019-11-28 MED ORDER — FAMOTIDINE 20 MG PO TABS
20.0000 mg | ORAL_TABLET | Freq: Once | ORAL | Status: AC
  Administered 2019-11-28: 20 mg via ORAL
  Filled 2019-11-28: qty 1

## 2019-11-28 MED ORDER — HEPARIN SOD (PORK) LOCK FLUSH 100 UNIT/ML IV SOLN
500.0000 [IU] | Freq: Once | INTRAVENOUS | Status: AC
  Administered 2019-11-28: 500 [IU] via INTRAVENOUS

## 2019-11-28 MED ORDER — EPOETIN ALFA-EPBX 10000 UNIT/ML IJ SOLN
20000.0000 [IU] | Freq: Once | INTRAMUSCULAR | Status: AC
  Administered 2019-11-28: 20000 [IU] via SUBCUTANEOUS
  Filled 2019-11-28: qty 2

## 2019-11-28 MED ORDER — MONTELUKAST SODIUM 10 MG PO TABS
10.0000 mg | ORAL_TABLET | Freq: Once | ORAL | Status: AC
  Administered 2019-11-28: 10 mg via ORAL
  Filled 2019-11-28: qty 1

## 2019-11-28 NOTE — Patient Instructions (Addendum)
[facility] at [facility] Discharge Instructions  You were seen today by Dr. Katragadda. He went over your recent results. You received your treatment today. Dr. Katragadda will see you back in 4 weeks for labs and follow up.   Thank you for choosing [facility] at [facility] to provide your oncology and hematology care.  To afford each patient quality time with our provider, please arrive at least 15 minutes before your scheduled appointment time.   If you have a lab appointment with the Cancer Center please come in thru the Main Entrance and check in at the main information desk  You need to re-schedule your appointment should you arrive 10 or more minutes late.  We strive to give you quality time with our providers, and arriving late affects you and other patients whose appointments are after yours.  Also, if you no show three or more times for appointments you may be dismissed from the clinic at the providers discretion.     Again, thank you for choosing [facility].  Our hope is that these requests will decrease the amount of time that you wait before being seen by our physicians.       _____________________________________________________________  Should you have questions after your visit to  Cancer Center, please contact our office at [phone] between the hours of 8:00 a.m. and 4:30 p.m.  Voicemails left after 4:00 p.m. will not be returned until the following business day.  For prescription refill requests, have your pharmacy contact our office and allow 72 hours.    Cancer Center Support Programs:   > Cancer Support Group  2nd Tuesday of the month 1pm-2pm, Journey Room    

## 2019-11-28 NOTE — Progress Notes (Signed)
[place] [place], [place] [address]   CLINIC:  Medical Oncology/Hematology  PCP:  Rosita Fire, [name] [address] [place] / [place] [address] [phone]   REASON FOR VISIT:  Follow-up for kappa light chain myeloma  PRIOR THERAPY:  1. CyBorD from 06/17/2018 to 04/04/2019. 2. Carfilzomib x 7 cycles from 04/21/2019 to 11/01/2019.  NGS Results: Not done  CURRENT THERAPY: Darzalex Faspro monthly  BRIEF ONCOLOGIC HISTORY:  Oncology History  Kappa light chain myeloma ([place])  06/16/2018 Initial Diagnosis   Kappa light chain myeloma ([place])   06/17/2018 - 04/10/2019 Chemotherapy   The patient had palonosetron (ALOXI) injection 0.25 mg, 0.25 mg, Intravenous,  Once, 27 of 27 cycles Administration: 0.25 mg (06/17/2018), 0.25 mg (06/23/2018), 0.25 mg (06/30/2018), 0.25 mg (07/07/2018), 0.25 mg (07/14/2018), 0.25 mg (07/22/2018), 0.25 mg (07/29/2018), 0.25 mg (08/06/2018), 0.25 mg (08/13/2018), 0.25 mg (08/20/2018), 0.25 mg (08/27/2018), 0.25 mg (09/06/2018), 0.25 mg (09/13/2018), 0.25 mg (09/20/2018), 0.25 mg (09/27/2018), 0.25 mg (10/04/2018), 0.25 mg (10/11/2018), 0.25 mg (10/18/2018), 0.25 mg (10/25/2018), 0.25 mg (11/01/2018), 0.25 mg (11/08/2018), 0.25 mg (11/15/2018), 0.25 mg (11/22/2018), 0.25 mg (11/29/2018), 0.25 mg (12/08/2018), 0.25 mg (12/16/2018) bortezomib SQ (VELCADE) chemo injection 2.5 mg, 1.3 mg/m2 = 2.5 mg, Subcutaneous,  Once, 41 of 42 cycles Administration: 2.5 mg (06/17/2018), 2.5 mg (06/23/2018), 2.5 mg (06/30/2018), 2.5 mg (07/07/2018), 2.5 mg (07/14/2018), 2.5 mg (07/22/2018), 2.5 mg (07/29/2018), 2.5 mg (08/06/2018), 2.5 mg (08/13/2018), 2.5 mg (08/20/2018), 2.5 mg (08/27/2018), 2.5 mg (09/06/2018), 2.5 mg (09/13/2018), 2.5 mg (09/20/2018), 2.5 mg (09/27/2018), 2.5 mg (10/04/2018), 2.5 mg (10/11/2018), 2.5 mg (10/18/2018), 2.5 mg (10/25/2018), 2.5 mg (11/01/2018), 2.5 mg (11/08/2018), 2.5 mg (11/15/2018), 2.5 mg (11/22/2018), 2.5 mg (11/29/2018), 2.5 mg (12/08/2018), 2.5 mg (12/16/2018), 2.5 mg  (12/27/2018), 2.5 mg (01/03/2019), 2.5 mg (01/10/2019), 2.5 mg (01/17/2019), 2.5 mg (01/24/2019), 2.5 mg (01/31/2019), 2.5 mg (02/07/2019), 2.5 mg (02/14/2019), 2.5 mg (02/21/2019), 2.5 mg (02/28/2019), 2.5 mg (03/07/2019), 2.5 mg (03/14/2019), 2.5 mg (03/21/2019), 2.5 mg (03/29/2019), 2.5 mg (04/04/2019) cyclophosphamide (CYTOXAN) 300 mg in sodium chloride 0.9 % 250 mL chemo infusion, 300 mg (100 % of original dose 300 mg), Intravenous,  Once, 26 of 26 cycles Dose modification: 300 mg (original dose 300 mg, Cycle 1, Reason: Change in SCr/CrCl), 500 mg (original dose 500 mg, Cycle 2, Reason: Provider Judgment), 500 mg (original dose 500 mg, Cycle 3, Reason: Change in SCr/CrCl) Administration: 300 mg (06/17/2018), 500 mg (06/23/2018), 500 mg (06/30/2018), 600 mg (07/07/2018), 600 mg (07/14/2018), 600 mg (07/22/2018), 600 mg (07/29/2018), 600 mg (08/06/2018), 600 mg (08/13/2018), 600 mg (08/20/2018), 600 mg (08/27/2018), 600 mg (09/06/2018), 600 mg (09/13/2018), 600 mg (09/20/2018), 600 mg (09/27/2018), 600 mg (10/04/2018), 600 mg (10/11/2018), 600 mg (10/18/2018), 600 mg (10/25/2018), 600 mg (11/01/2018), 600 mg (11/08/2018), 600 mg (11/15/2018), 600 mg (11/22/2018), 600 mg (11/29/2018), 600 mg (12/08/2018), 600 mg (12/16/2018)  for chemotherapy treatment.    04/21/2019 - 11/01/2019 Chemotherapy   The patient had ondansetron (ZOFRAN) injection 4 mg, 4 mg (100 % of original dose 4 mg), Intravenous,  Once, 6 of 7 cycles Dose modification: 8 mg (original dose 4 mg, Cycle 2), 4 mg (original dose 4 mg, Cycle 2), 4 mg (original dose 4 mg, Cycle 2) Administration: 4 mg (06/09/2019), 4 mg (06/10/2019), 4 mg (06/23/2019), 4 mg (06/24/2019), 4 mg (07/04/2019), 4 mg (07/05/2019), 4 mg (07/11/2019), 4 mg (07/12/2019), 4 mg (08/22/2019), 4 mg (08/23/2019), 4 mg (08/30/2019), 4 mg (08/31/2019), 4 mg (09/06/2019), 4 mg (09/07/2019), 4 mg (  07/25/2019), 4 mg (07/26/2019), 4 mg (08/01/2019), 4 mg (08/02/2019), 4 mg (09/19/2019), 4 mg (09/20/2019), 4 mg (09/26/2019), 4 mg (09/27/2019), 4 mg  (10/04/2019), 4 mg (10/05/2019), 4 mg (10/17/2019), 4 mg (10/18/2019), 4 mg (10/24/2019), 4 mg (10/25/2019), 4 mg (10/31/2019), 4 mg (11/01/2019) carfilzomib (KYPROLIS) 40 mg in dextrose 5 % 50 mL chemo infusion, 20 mg/m2 = 40 mg, Intravenous,  Once, 7 of 8 cycles Dose modification: 27 mg/m2 (original dose 27 mg/m2, Cycle 1, Reason: Provider Judgment) Administration: 40 mg (04/21/2019), 40 mg (04/22/2019), 60 mg (04/28/2019), 60 mg (04/29/2019), 60 mg (05/05/2019), 60 mg (05/06/2019), 56 mg (05/26/2019), 56 mg (05/27/2019), 56 mg (06/02/2019), 56 mg (06/03/2019), 56 mg (06/09/2019), 56 mg (06/10/2019), 56 mg (08/22/2019), 56 mg (08/23/2019), 56 mg (08/30/2019), 56 mg (08/31/2019), 56 mg (09/06/2019), 56 mg (09/07/2019), 56 mg (06/23/2019), 56 mg (06/24/2019), 56 mg (07/04/2019), 56 mg (07/05/2019), 56 mg (07/11/2019), 56 mg (07/12/2019), 56 mg (07/25/2019), 56 mg (07/26/2019), 56 mg (08/01/2019), 56 mg (08/02/2019), 56 mg (09/19/2019), 56 mg (09/20/2019), 56 mg (09/26/2019), 56 mg (09/27/2019), 56 mg (10/04/2019), 56 mg (10/05/2019), 56 mg (10/17/2019), 56 mg (10/18/2019), 56 mg (10/24/2019), 56 mg (10/25/2019), 56 mg (10/31/2019), 56 mg (11/01/2019)  for chemotherapy treatment.    11/21/2019 -  Chemotherapy   The patient had dexamethasone (DECADRON) 4 MG tablet, 40 mg (100 % of original dose 40 mg), Oral, Every 7 days, 2 of 11 cycles Dose modification: 40 mg (original dose 40 mg, Cycle 0) Administration: 20 mg (11/21/2019) daratumumab-hyaluronidase-fihj (DARZALEX FASPRO) 1800-30000 MG-UT/15ML chemo SQ injection 1,800 mg, 1,800 mg, Subcutaneous,  Once, 1 of 10 cycles Administration: 1,800 mg (11/21/2019)  for chemotherapy treatment.      CANCER STAGING: Cancer Staging No matching staging information was found for the patient.  INTERVAL HISTORY:  [name], a 59 y.o. male, returns for routine follow-up and consideration for next cycle of chemotherapy. [name] was last seen on 11/14/2019.  Due for day #8 of cycle #1 of Darzalex Faspro today.   Overall,  he tells me he has been feeling pretty well. He reports that he had to pay for his Dilaudid out-of-pocket; his pain has become more prominent across his back. He complains of having palpitations most prominent in his legs at night. He has cut down on consuming greasy foods and reports that eating sweets leads to itching all over his body.  He received his second [place] vaccine on 8/16.  Overall, he feels ready for next cycle of chemo today.    REVIEW OF SYSTEMS:  Review of Systems  Constitutional: Positive for appetite change (severely decreased) and fatigue (severe).  Respiratory: Positive for cough.   Cardiovascular: Positive for chest pain (8/10 ribs and back pain) and palpitations (nightly in legs).  Gastrointestinal: Positive for nausea.  Genitourinary: Positive for frequency.   Skin: Positive for itching (all over after eating sweets).  Neurological: Positive for dizziness and headaches.  Psychiatric/Behavioral: Positive for sleep disturbance.  All other systems reviewed and are negative.   PAST MEDICAL/SURGICAL HISTORY:  Past Medical History:  Diagnosis Date  . Allergy   . Arthritis    neck and back  . Blood transfusion without reported diagnosis   . Bone metastases ([place]) 09/19/2019  . BPH (benign prostatic hyperplasia)   . Cancer [facility]) 2004   testicle  . Chronic kidney disease    kidney stones  . Left lumbar radiculopathy 06/12/2016  . 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 Medical history non-contributory    Pt has scattered thoughts  and uncertain of past medical history  . 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 Stones, urinary tract   . Substance abuse ([place])    prescribed oxydcodone- 10 years   Past Surgical History:  Procedure Laterality Date  . BACK SURGERY     x5  . CERVICAL DISC SURGERY     x2  . COLONOSCOPY WITH PROPOFOL N/A 08/11/2016   Dr. Gala Romney: non-bleeding internal hemorrhoids, one 4 mm hyperplastic rectal polyp, diverticulosis in entire examined  colon  . POLYPECTOMY  08/11/2016   Procedure: POLYPECTOMY;  Surgeon: [name], [name];  Location: [facility] ENDO SUITE;  Service: Endoscopy;;  colon  . PORTACATH PLACEMENT Left 09/20/2018   Procedure: INSERTION PORT-A-CATH (catheter attached left subclavian);  Surgeon: Virl Cagey, [name];  Location: [facility] ORS;  Service: General;  Laterality: Left;  . testicular cancer  2004  . TONSILLECTOMY      SOCIAL HISTORY:  Social History   Socioeconomic History  . Marital status: Single    Spouse name: Not on file  . Number of children: 1  . Years of education: 6  . Highest education level: Not on file  Occupational History  . Occupation: retired    Comment: Psychologist, prison and probation services  . Occupation: disabled  Tobacco Use  . Smoking status: Current Every Day Smoker    Packs/day: 1.00    Years: 40.00    Pack years: 40.00    Types: Cigarettes    Start date: 03/31/1974  . Smokeless tobacco: Never Used  Vaping Use  . Vaping Use: Never used  Substance and Sexual Activity  . Alcohol use: Yes    Comment: Occasional  . Drug use: No    Types: Cocaine    Comment: Remote hx of cocaine, quit 1989  . Sexual activity: Yes  Other Topics Concern  . Not on file  Social History Narrative   [employer] for 12 years   [employer] for 43 years      Never married   One daughter      Lives alone   Retail banker   Right-handed   Occasional caffeine use         Social Determinants of Health   Financial Resource Strain:   . Difficulty of Paying Living Expenses: Not on file  Food Insecurity:   . Worried About Charity fundraiser in the Last Year: Not on file  . Ran Out of Food in the Last Year: Not on file  Transportation Needs:   . Lack of Transportation (Medical): Not on file  . Lack of Transportation (Non-Medical): Not on file  Physical Activity:   . Days of Exercise per Week: Not on file  . Minutes of Exercise per Session: Not on file  Stress:   . Feeling of Stress : Not on file  Social  Connections:   . Frequency of Communication with Friends and Family: Not on file  . Frequency of Social Gatherings with Friends and Family: Not on file  . Attends Religious Services: Not on file  . Active Member of Clubs or Organizations: Not on file  . Attends Archivist Meetings: Not on file  . Marital Status: Not on file  Intimate Partner Violence:   . Fear of Current or Ex-Partner: Not on file  . Emotionally Abused: Not on file  . Physically Abused: Not on file  . Sexually Abused: Not on file    FAMILY HISTORY:  Family History  Problem Relation Age of Onset  .  COPD Mother   . Heart disease Mother   . Other Father        Never knew his father.  . Thyroid disease Sister   . Arthritis Sister   . Heart disease Sister        bypass  . Colon cancer Neg Hx     CURRENT MEDICATIONS:  Current Outpatient Medications  Medication Sig Dispense Refill  . aspirin EC 81 MG tablet Take 81 mg by mouth daily.    . calcitRIOL (ROCALTROL) 0.5 MCG capsule Take 0.5 mcg by mouth daily.     Marland Kitchen CARFILZOMIB IV Inject into the vein.    Marland Kitchen dexamethasone (DECADRON) 4 MG tablet Take 10 tablets (40 mg total) by mouth every 7 (seven) days. Take for 2 days starting the night of chemotherapy. 40 tablet 6  . Epoetin Alfa (PROCRIT IJ) Inject as directed. Unsure of dosage- gets this once weekly    . HYDROmorphone (DILAUDID) 4 MG tablet Take 1 tablet (4 mg total) by mouth every 3 (three) hours as needed for severe pain. 240 tablet 0  . Misc. Devices MISC Please provide patient with rollaider.  Dx: multiple myeloma C90.0 1 each 99  . Patiromer Sorbitex Calcium (VELTASSA PO) Take by mouth 2 (two) times a week.     . pomalidomide (POMALYST) 3 MG capsule Take 1 capsule (3 mg total) by mouth daily. 21 capsule 0  . senna (SENOKOT) 8.6 MG tablet Take 1 tablet by mouth 2 (two) times daily.     . sodium bicarbonate 650 MG tablet Take 1,300 mg by mouth 2 (two) times daily.    Marland Kitchen torsemide (DEMADEX) 20 MG tablet  TAKING 1 TABLET DAILY, AND 2 TABLETS ONLY IF HIS ANKLES ARE REALLY SWOLLEN 180 tablet 1  . valACYclovir (VALTREX) 500 MG tablet TAKE 1 TABLET BY MOUTH TWICE A DAY 180 tablet 2  . prochlorperazine (COMPAZINE) 5 MG tablet Take 1 tablet (5 mg total) by mouth every 6 (six) hours as needed for nausea or vomiting. (Patient not taking: Reported on 11/28/2019) 30 tablet 2   No current facility-administered medications for this visit.    ALLERGIES:  Allergies  Allergen Reactions  . Oxycodone-Acetaminophen   . Acetaminophen Nausea Only and Other (See Comments)    Elevated liver enzymes  . Cymbalta [Duloxetine Hcl] Swelling    Facial swelling  . Diclofenac Sodium Swelling       . Diclofenac Sodium Swelling  . Imodium [Loperamide] Swelling    facial  . Lyrica [Pregabalin] Swelling  . Morphine Other (See Comments)    Bradycardia   . Vioxx [Rofecoxib] Swelling  . Aleve [Naproxen] Swelling    eye    PHYSICAL EXAM:  Performance status (ECOG): 1 - Symptomatic but completely ambulatory  There were no vitals filed for this visit. Wt Readings from Last 3 Encounters:  11/28/19 153 lb (69.4 kg)  11/28/19 152 lb 8.9 oz (69.2 kg)  11/21/19 150 lb 1.6 oz (68.1 kg)   Physical Exam Vitals reviewed.  Constitutional:      Appearance: Normal appearance.  Cardiovascular:     Rate and Rhythm: Normal rate and regular rhythm.     Pulses: Normal pulses.     Heart sounds: Normal heart sounds.  Pulmonary:     Effort: Pulmonary effort is normal.     Breath sounds: Normal breath sounds.  Chest:     Comments: Port-a-Cath in L chest Musculoskeletal:     Thoracic back: Bony tenderness (R ribs laterally and anterior  TTP) present.  Neurological:     General: No focal deficit present.     Mental Status: He is alert and oriented to person, place, and time.  Psychiatric:        Mood and Affect: Mood normal.        Behavior: Behavior normal.     LABORATORY DATA:  I have reviewed the labs as listed.   CBC Latest Ref Rng & Units 11/28/2019 11/21/2019 11/14/2019  WBC 4.0 - 10.5 K/uL 3.7(L) 3.6(L) 4.4  Hemoglobin 13.0 - 17.0 g/dL 8.3(L) 8.4(L) 9.3(L)  Hematocrit 39 - 52 % 25.1(L) 25.3(L) 27.1(L)  Platelets 150 - 400 K/uL 139(L) 160 169   CMP Latest Ref Rng & Units 11/28/2019 11/21/2019 11/14/2019  Glucose 70 - 99 mg/dL 101(H) 93 87  BUN 8 - 23 mg/dL 65(H) 67(H) 55(H)  Creatinine 0.61 - 1.24 mg/dL 6.43(H) 7.74(H) 7.18(H)  Sodium 135 - 145 mmol/L 135 132(L) 133(L)  Potassium 3.5 - 5.1 mmol/L 4.7 4.6 4.6  Chloride 98 - 111 mmol/L 106 99 99  CO2 22 - 32 mmol/L 19(L) 22 24  Calcium 8.9 - 10.3 mg/dL 9.2 9.4 9.8  Total Protein 6.5 - 8.1 g/dL 5.8(L) 6.2(L) 6.3(L)  Total Bilirubin 0.3 - 1.2 mg/dL 0.7 0.7 0.9  Alkaline Phos 38 - 126 U/L 70 77 58  AST 15 - 41 U/L _0 ALT 0 - 44 U/L _1 DIAGNOSTIC IMAGING:  I have independently reviewed the scans and discussed with the patient. No results found.   ASSESSMENT:  1. Kappa light chain myeloma, high risk, stage III: -CyBorD from 06/17/2018 through 04/04/2019 with progression. -He was evaluated for [facility] but has refused it. -Carfilzomib, pomalidomide and dexamethasone from 04/21/2019. -Myeloma panel from 08/08/2019 shows M spike 0.1 g. Previously this was not detectable. Kappa light chains are 561, lambda light chains 36.9 and ratio 15.2. Ratio was previously 11.2 and prior to that 16.1. -We had to held his carfilzomib day 15 and 16 of his last cycle because of elevated creatinine. -Daratumumab pomalidomide dexamethasone started on 11/21/2019.  2. Back pain: -MRI of the lumbar and thoracic spine on 04/20/2019 showed ventral epidural tumor posterior to the T12 vertebral body with mild spinal stenosis. -XRT to the back from 05/22/2019 through 05/27/2019. -MRI of the thoracic and lumbar spine on 10/06/2019 shows T7, T10, T11 and T12 lesions are slightly better seen on the study as the prior study was  degraded by motion artifact. No new findings. Overall grossly stable appearing myelomatous changes at T12, L2 and L3 with no progressive findings. Right seventh and ninth posterior rib lesions are probably stable. Right seventh posterior rib lesion may be larger or just better seen. There does appear to be a new adjacent soft tissue component.   PLAN:  1. Kappa light chain myeloma: -He tolerated daratumumab first dose very well without any reactions. -I reviewed his labs today.  Creatinine improved to 6.43.  White count was 3.7 with ANC was 1.7.  Hemoglobin 8.3 and platelet count 139. -He will proceed with his week 2 of Darzalex.  He will continue pomalidomide last pill today.  I plan to repeat myeloma labs in 3 weeks and see him back in 4 weeks.  2. Back pain: -He continues to have back pain and right lateral rib pain.  Continue Dilaudid 4 mg every 3 hours which is helping.  3. Anemia: -Combination anemia from CKD and myeloma treatments.  Hemoglobin is 8.3.  Continue Retacrit as needed.  4. Myeloma bone disease: -Continue monthly denosumab.  5. ID/thromboprophylaxis: -Continue Valtrex and aspirin.  6.  Irregular heart rate: -Last EKG showed prolonged QTC and frequent PVCs.  Cardiology referral was made.   Orders placed this encounter:  No orders of the defined types were placed in this encounter.    Brandon Jack, [name] [place] [phone]   I, Milinda Antis, am acting as a scribe for Dr. Sanda Linger.  I, Brandon Jack [name], have reviewed the above documentation for accuracy and completeness, and I agree with the above.

## 2019-11-28 NOTE — Patient Instructions (Signed)
[facility] Discharge Instructions for Patients Receiving Chemotherapy  Today you received the following chemotherapy agents   To help prevent nausea and vomiting after your treatment, we encourage you to take your nausea medication   If you develop nausea and vomiting that is not controlled by your nausea medication, call the clinic.   BELOW ARE SYMPTOMS THAT SHOULD BE REPORTED IMMEDIATELY:  *FEVER GREATER THAN 100.5 F  *CHILLS WITH OR WITHOUT FEVER  NAUSEA AND VOMITING THAT IS NOT CONTROLLED WITH YOUR NAUSEA MEDICATION  *UNUSUAL SHORTNESS OF BREATH  *UNUSUAL BRUISING OR BLEEDING  TENDERNESS IN MOUTH AND THROAT WITH OR WITHOUT PRESENCE OF ULCERS  *URINARY PROBLEMS  *BOWEL PROBLEMS  UNUSUAL RASH Items with * indicate a potential emergency and should be followed up as soon as possible.  Feel free to call the clinic should you have any questions or concerns. The clinic phone number is [phone].  Please show the CHEMO ALERT CARD at check-in to the Emergency Department and triage nurse.   

## 2019-11-28 NOTE — Progress Notes (Signed)
Patient was assessed by Dr. Delton Coombes and labs have been reviewed. Dr. Delton Coombes aware of Renal functions, patient is okay to proceed with treatment today. Primary RN and pharmacy aware.

## 2019-11-28 NOTE — Progress Notes (Signed)
Pt here for D8C1 of darazalex subcutaneus.  Hemoglobin 8.3.  BUN 65 and creatinine 6.43.  Okay to proceed with treatment.  Labs verified with Dr Raliegh Ip.  [name] presents today for injection per the provider's orders.  Retacrit 20,000 units in right arm administration without incident; injection site WNL; see MAR for injection details.  Patient tolerated procedure well and without incident.  No questions or complaints noted at this time.   Tolerated treatment well today without incidence.  Vital signs stable prior to discharge. Discharged ambulatory after 1 hour wait period.    Patient is taking pomalyst and has not missed any doses and reports no side effects at this time.

## 2019-11-29 ENCOUNTER — Ambulatory Visit (HOSPITAL_COMMUNITY): Payer: Medicare Other

## 2019-11-30 ENCOUNTER — Other Ambulatory Visit (HOSPITAL_COMMUNITY): Payer: Self-pay | Admitting: *Deleted

## 2019-11-30 DIAGNOSIS — C9000 Multiple myeloma not having achieved remission: Secondary | ICD-10-CM

## 2019-11-30 MED ORDER — POMALIDOMIDE 3 MG PO CAPS: 3.0000 mg | ORAL_CAPSULE | Freq: Every day | ORAL | 0 refills | Status: DC

## 2019-11-30 NOTE — Telephone Encounter (Signed)
Chart reviewed. Per Dr. Tomie China last office note, it is okay to refill Pomalyst

## 2019-12-06 ENCOUNTER — Ambulatory Visit (HOSPITAL_COMMUNITY): Payer: Medicare Other | Admitting: Hematology

## 2019-12-06 ENCOUNTER — Inpatient Hospital Stay (HOSPITAL_COMMUNITY): Payer: Medicare Other | Attending: Hematology

## 2019-12-06 ENCOUNTER — Other Ambulatory Visit: Payer: Self-pay

## 2019-12-06 ENCOUNTER — Inpatient Hospital Stay (HOSPITAL_COMMUNITY): Payer: Medicare Other

## 2019-12-06 DIAGNOSIS — C9000 Multiple myeloma not having achieved remission: Secondary | ICD-10-CM

## 2019-12-06 DIAGNOSIS — N185 Chronic kidney disease, stage 5: Secondary | ICD-10-CM

## 2019-12-06 DIAGNOSIS — R9431 Abnormal electrocardiogram [ECG] [EKG]: Secondary | ICD-10-CM | POA: Diagnosis not present

## 2019-12-06 DIAGNOSIS — Z5112 Encounter for antineoplastic immunotherapy: Secondary | ICD-10-CM | POA: Insufficient documentation

## 2019-12-06 DIAGNOSIS — D631 Anemia in chronic kidney disease: Secondary | ICD-10-CM | POA: Insufficient documentation

## 2019-12-06 DIAGNOSIS — N189 Chronic kidney disease, unspecified: Secondary | ICD-10-CM | POA: Insufficient documentation

## 2019-12-06 DIAGNOSIS — Z5189 Encounter for other specified aftercare: Secondary | ICD-10-CM | POA: Diagnosis not present

## 2019-12-06 LAB — CBC WITH DIFFERENTIAL/PLATELET
Band Neutrophils: 3 %
Basophils Absolute: 0 10*3/uL (ref 0.0–0.1)
Basophils Relative: 2 %
Eosinophils Absolute: 0.3 10*3/uL (ref 0.0–0.5)
Eosinophils Relative: 17 %
HCT: 23.7 % — ABNORMAL LOW (ref 39.0–52.0)
Hemoglobin: 8 g/dL — ABNORMAL LOW (ref 13.0–17.0)
Lymphocytes Relative: 32 %
Lymphs Abs: 0.5 10*3/uL — ABNORMAL LOW (ref 0.7–4.0)
MCH: 42.6 pg — ABNORMAL HIGH (ref 26.0–34.0)
MCHC: 33.8 g/dL (ref 30.0–36.0)
MCV: 126.1 fL — ABNORMAL HIGH (ref 80.0–100.0)
Metamyelocytes Relative: 4 %
Monocytes Absolute: 0.2 10*3/uL (ref 0.1–1.0)
Monocytes Relative: 13 %
Neutro Abs: 0.5 10*3/uL — ABNORMAL LOW (ref 1.7–7.7)
Neutrophils Relative %: 29 %
Platelets: 70 10*3/uL — ABNORMAL LOW (ref 150–400)
RBC: 1.88 MIL/uL — ABNORMAL LOW (ref 4.22–5.81)
RDW: 15.8 % — ABNORMAL HIGH (ref 11.5–15.5)
WBC: 1.7 10*3/uL — ABNORMAL LOW (ref 4.0–10.5)
nRBC: 0 % (ref 0.0–0.2)

## 2019-12-06 LAB — COMPREHENSIVE METABOLIC PANEL
ALT: 13 U/L (ref 0–44)
AST: 17 U/L (ref 15–41)
Albumin: 3.3 g/dL — ABNORMAL LOW (ref 3.5–5.0)
Alkaline Phosphatase: 78 U/L (ref 38–126)
Anion gap: 13 (ref 5–15)
BUN: 64 mg/dL — ABNORMAL HIGH (ref 8–23)
CO2: 17 mmol/L — ABNORMAL LOW (ref 22–32)
Calcium: 9.4 mg/dL (ref 8.9–10.3)
Chloride: 105 mmol/L (ref 98–111)
Creatinine, Ser: 5.74 mg/dL — ABNORMAL HIGH (ref 0.61–1.24)
GFR calc Af Amer: 11 mL/min — ABNORMAL LOW (ref >60)
GFR calc non Af Amer: 10 mL/min — ABNORMAL LOW (ref >60)
Glucose, Bld: 89 mg/dL (ref 70–99)
Potassium: 4.9 mmol/L (ref 3.5–5.1)
Sodium: 135 mmol/L (ref 135–145)
Total Bilirubin: 1.3 mg/dL — ABNORMAL HIGH (ref 0.3–1.2)
Total Protein: 5.7 g/dL — ABNORMAL LOW (ref 6.5–8.1)

## 2019-12-06 MED ORDER — FAMOTIDINE 20 MG PO TABS
20.0000 mg | ORAL_TABLET | Freq: Once | ORAL | Status: AC
  Administered 2019-12-06: 20 mg via ORAL

## 2019-12-06 MED ORDER — EPOETIN ALFA-EPBX 10000 UNIT/ML IJ SOLN
INTRAMUSCULAR | Status: AC
  Filled 2019-12-06: qty 2

## 2019-12-06 MED ORDER — ACETAMINOPHEN 325 MG PO TABS
ORAL_TABLET | ORAL | Status: AC
  Filled 2019-12-06: qty 2

## 2019-12-06 MED ORDER — TBO-FILGRASTIM 480 MCG/0.8ML ~~LOC~~ SOSY
480.0000 ug | PREFILLED_SYRINGE | Freq: Once | SUBCUTANEOUS | Status: DC
  Filled 2019-12-06: qty 0.8

## 2019-12-06 MED ORDER — MONTELUKAST SODIUM 10 MG PO TABS
ORAL_TABLET | ORAL | Status: AC
  Filled 2019-12-06: qty 1

## 2019-12-06 MED ORDER — FAMOTIDINE 20 MG PO TABS
ORAL_TABLET | ORAL | Status: AC
  Filled 2019-12-06: qty 1

## 2019-12-06 MED ORDER — DIPHENHYDRAMINE HCL 25 MG PO CAPS
50.0000 mg | ORAL_CAPSULE | Freq: Once | ORAL | Status: AC
  Administered 2019-12-06: 50 mg via ORAL

## 2019-12-06 MED ORDER — DEXAMETHASONE 4 MG PO TABS
20.0000 mg | ORAL_TABLET | Freq: Once | ORAL | Status: AC
  Administered 2019-12-06: 20 mg via ORAL
  Filled 2019-12-06: qty 5

## 2019-12-06 MED ORDER — EPOETIN ALFA-EPBX 10000 UNIT/ML IJ SOLN
20000.0000 [IU] | Freq: Once | INTRAMUSCULAR | Status: AC
  Administered 2019-12-06: 20000 [IU] via SUBCUTANEOUS

## 2019-12-06 MED ORDER — DIPHENHYDRAMINE HCL 25 MG PO CAPS
ORAL_CAPSULE | ORAL | Status: AC
  Filled 2019-12-06: qty 2

## 2019-12-06 MED ORDER — FILGRASTIM-SNDZ 480 MCG/0.8ML IJ SOSY
480.0000 ug | PREFILLED_SYRINGE | Freq: Once | INTRAMUSCULAR | Status: AC
  Administered 2019-12-06: 480 ug via SUBCUTANEOUS
  Filled 2019-12-06: qty 0.8

## 2019-12-06 MED ORDER — MONTELUKAST SODIUM 10 MG PO TABS
10.0000 mg | ORAL_TABLET | Freq: Once | ORAL | Status: AC
  Administered 2019-12-06: 10 mg via ORAL

## 2019-12-06 MED ORDER — DARATUMUMAB-HYALURONIDASE-FIHJ 1800-30000 MG-UT/15ML ~~LOC~~ SOLN
1800.0000 mg | Freq: Once | SUBCUTANEOUS | Status: AC
  Administered 2019-12-06: 1800 mg via SUBCUTANEOUS
  Filled 2019-12-06: qty 15

## 2019-12-06 NOTE — Patient Instructions (Signed)
[facility] Discharge Instructions for Patients Receiving Chemotherapy   Beginning January 23rd 2017 lab work for the Cancer Center will be done in the  Main lab at [facility] on 1st floor. If you have a lab appointment with the Cancer Center please come in thru the  Main Entrance and check in at the main information desk   Today you received the following chemotherapy agents Daratumumab  To help prevent nausea and vomiting after your treatment, we encourage you to take your nausea medication   If you develop nausea and vomiting, or diarrhea that is not controlled by your medication, call the clinic.  The clinic phone number is [phone]. Office hours are Monday-Friday 8:30am-5:00pm.  BELOW ARE SYMPTOMS THAT SHOULD BE REPORTED IMMEDIATELY:  *FEVER GREATER THAN 101.0 F  *CHILLS WITH OR WITHOUT FEVER  NAUSEA AND VOMITING THAT IS NOT CONTROLLED WITH YOUR NAUSEA MEDICATION  *UNUSUAL SHORTNESS OF BREATH  *UNUSUAL BRUISING OR BLEEDING  TENDERNESS IN MOUTH AND THROAT WITH OR WITHOUT PRESENCE OF ULCERS  *URINARY PROBLEMS  *BOWEL PROBLEMS  UNUSUAL RASH Items with * indicate a potential emergency and should be followed up as soon as possible. If you have an emergency after office hours please contact your primary care physician or go to the nearest emergency department.  Please call the clinic during office hours if you have any questions or concerns.   You may also contact the Patient Navigator at [phone] should you have any questions or need assistance in obtaining follow up care.      Resources For Cancer Patients and their Caregivers ? American Cancer Society: Can assist with transportation, wigs, general needs, runs Look Good Feel Better.        1-888-227-6333 ? Cancer Care: Provides financial assistance, online support groups, medication/co-pay assistance.  1-800-813-HOPE (4673) ? [facility] Assists [place]  cancer patients and their families through emotional , educational and financial support.  336-427-4357 ? [place] DSS Where to apply for food stamps, Medicaid and utility assistance. 336-342-1394 ? [facility]: Transportation to medical appointments. 336-347-2287 ? Social Security Administration: May apply for disability if have a Stage IV cancer. 336-342-7796 1-800-772-1213 ? [place] Aging, Disability and Transit Services: Assists with nutrition, care and transit needs. 336-349-2343          

## 2019-12-06 NOTE — Progress Notes (Signed)
[name] presents today for D15C1 Daratumumab. Pt denies any new changes or symptoms since last treatment. Lab results and vitals have been reviewed, including Cr 5.74, Bili 1.3, Plt 70, ANC 0.5. Lab results were also reviewed by Dr. Delton Coombes who has approved proceeding with treatment today as planned. Per MD, orders also entered for GCSF to be given today.  Injections tolerated without incident or complaint. Port flushed and deaccessed per protocol, see MAR and IV flowsheet for details. Discharged in satisfactory condition with follow up instructions.

## 2019-12-06 NOTE — Patient Instructions (Signed)
[facility] at [facility] Discharge Instructions  Labs drawn from portacath today   Thank you for choosing [facility] at [facility] to provide your oncology and hematology care.  To afford each patient quality time with our provider, please arrive at least 15 minutes before your scheduled appointment time.   If you have a lab appointment with the Cancer Center please come in thru the Main Entrance and check in at the main information desk.  You need to re-schedule your appointment should you arrive 10 or more minutes late.  We strive to give you quality time with our providers, and arriving late affects you and other patients whose appointments are after yours.  Also, if you no show three or more times for appointments you may be dismissed from the clinic at the providers discretion.     Again, thank you for choosing [facility].  Our hope is that these requests will decrease the amount of time that you wait before being seen by our physicians.       _____________________________________________________________  Should you have questions after your visit to [facility], please contact our office at [phone] and follow the prompts.  Our office hours are 8:00 a.m. and 4:30 p.m. Monday - Friday.  Please note that voicemails left after 4:00 p.m. may not be returned until the following business day.  We are closed weekends and major holidays.  You do have access to a nurse 24-7, just call the main number to the clinic [phone] and do not press any options, hold on the line and a nurse will answer the phone.    For prescription refill requests, have your pharmacy contact our office and allow 72 hours.    Due to Covid, you will need to wear a mask upon entering the hospital. If you do not have a mask, a mask will be given to you at the Main Entrance upon arrival. For doctor visits, patients may have 1 support person age 18  or older with them. For treatment visits, patients can not have anyone with them due to social distancing guidelines and our immunocompromised population.     

## 2019-12-12 ENCOUNTER — Other Ambulatory Visit: Payer: Self-pay

## 2019-12-12 ENCOUNTER — Inpatient Hospital Stay (HOSPITAL_COMMUNITY): Payer: Medicare Other

## 2019-12-12 ENCOUNTER — Encounter (HOSPITAL_COMMUNITY): Payer: Self-pay

## 2019-12-12 DIAGNOSIS — N185 Chronic kidney disease, stage 5: Secondary | ICD-10-CM

## 2019-12-12 DIAGNOSIS — E875 Hyperkalemia: Secondary | ICD-10-CM

## 2019-12-12 DIAGNOSIS — C9000 Multiple myeloma not having achieved remission: Secondary | ICD-10-CM

## 2019-12-12 DIAGNOSIS — Z5112 Encounter for antineoplastic immunotherapy: Secondary | ICD-10-CM | POA: Diagnosis not present

## 2019-12-12 LAB — CBC WITH DIFFERENTIAL/PLATELET
Abs Immature Granulocytes: 0.22 10*3/uL — ABNORMAL HIGH (ref 0.00–0.07)
Basophils Absolute: 0 10*3/uL (ref 0.0–0.1)
Basophils Relative: 1 %
Eosinophils Absolute: 0 10*3/uL (ref 0.0–0.5)
Eosinophils Relative: 1 %
HCT: 22.1 % — ABNORMAL LOW (ref 39.0–52.0)
Hemoglobin: 7.5 g/dL — ABNORMAL LOW (ref 13.0–17.0)
Lymphocytes Relative: 22 %
Lymphs Abs: 1 10*3/uL (ref 0.7–4.0)
MCH: 43.1 pg — ABNORMAL HIGH (ref 26.0–34.0)
MCHC: 33.9 g/dL (ref 30.0–36.0)
MCV: 127 fL — ABNORMAL HIGH (ref 80.0–100.0)
Monocytes Absolute: 1.8 10*3/uL — ABNORMAL HIGH (ref 0.1–1.0)
Monocytes Relative: 42 %
Neutro Abs: 1.5 10*3/uL — ABNORMAL LOW (ref 1.7–7.7)
Neutrophils Relative %: 34 %
Platelets: 52 10*3/uL — ABNORMAL LOW (ref 150–400)
RBC: 1.74 MIL/uL — ABNORMAL LOW (ref 4.22–5.81)
RDW: 16.3 % — ABNORMAL HIGH (ref 11.5–15.5)
WBC: 4.4 10*3/uL (ref 4.0–10.5)
nRBC: 0 % (ref 0.0–0.2)

## 2019-12-12 LAB — COMPREHENSIVE METABOLIC PANEL
ALT: 15 U/L (ref 0–44)
AST: 24 U/L (ref 15–41)
Albumin: 3.2 g/dL — ABNORMAL LOW (ref 3.5–5.0)
Alkaline Phosphatase: 93 U/L (ref 38–126)
Anion gap: 11 (ref 5–15)
BUN: 59 mg/dL — ABNORMAL HIGH (ref 8–23)
CO2: 19 mmol/L — ABNORMAL LOW (ref 22–32)
Calcium: 9.1 mg/dL (ref 8.9–10.3)
Chloride: 104 mmol/L (ref 98–111)
Creatinine, Ser: 5.86 mg/dL — ABNORMAL HIGH (ref 0.61–1.24)
GFR calc Af Amer: 11 mL/min — ABNORMAL LOW (ref >60)
GFR calc non Af Amer: 10 mL/min — ABNORMAL LOW (ref >60)
Glucose, Bld: 91 mg/dL (ref 70–99)
Potassium: 5.3 mmol/L — ABNORMAL HIGH (ref 3.5–5.1)
Sodium: 134 mmol/L — ABNORMAL LOW (ref 135–145)
Total Bilirubin: 0.8 mg/dL (ref 0.3–1.2)
Total Protein: 5.6 g/dL — ABNORMAL LOW (ref 6.5–8.1)

## 2019-12-12 LAB — LACTATE DEHYDROGENASE: LDH: 581 U/L — ABNORMAL HIGH (ref 98–192)

## 2019-12-12 MED ORDER — EPOETIN ALFA-EPBX 10000 UNIT/ML IJ SOLN
20000.0000 [IU] | Freq: Once | INTRAMUSCULAR | Status: AC
  Administered 2019-12-12: 20000 [IU] via SUBCUTANEOUS

## 2019-12-12 MED ORDER — EPOETIN ALFA-EPBX 10000 UNIT/ML IJ SOLN
INTRAMUSCULAR | Status: AC
  Filled 2019-12-12: qty 2

## 2019-12-12 MED ORDER — FAMOTIDINE 20 MG PO TABS
ORAL_TABLET | ORAL | Status: AC
  Filled 2019-12-12: qty 1

## 2019-12-12 MED ORDER — FAMOTIDINE 20 MG PO TABS
20.0000 mg | ORAL_TABLET | Freq: Once | ORAL | Status: AC
  Administered 2019-12-12: 20 mg via ORAL

## 2019-12-12 MED ORDER — DARATUMUMAB-HYALURONIDASE-FIHJ 1800-30000 MG-UT/15ML ~~LOC~~ SOLN
1800.0000 mg | Freq: Once | SUBCUTANEOUS | Status: AC
  Administered 2019-12-12: 1800 mg via SUBCUTANEOUS
  Filled 2019-12-12: qty 15

## 2019-12-12 MED ORDER — DEXAMETHASONE 4 MG PO TABS
20.0000 mg | ORAL_TABLET | Freq: Once | ORAL | Status: AC
  Administered 2019-12-12: 20 mg via ORAL

## 2019-12-12 MED ORDER — INSULIN REGULAR HUMAN 100 UNIT/ML IJ SOLN
10.0000 [IU] | Freq: Once | INTRAMUSCULAR | Status: DC
  Filled 2019-12-12: qty 3

## 2019-12-12 MED ORDER — DEXTROSE 50 % IV SOLN
50.0000 mL | Freq: Once | INTRAVENOUS | Status: AC
  Administered 2019-12-12: 50 mL via INTRAVENOUS
  Filled 2019-12-12: qty 50

## 2019-12-12 MED ORDER — DIPHENHYDRAMINE HCL 25 MG PO CAPS
ORAL_CAPSULE | ORAL | Status: AC
  Filled 2019-12-12: qty 2

## 2019-12-12 MED ORDER — DIPHENHYDRAMINE HCL 25 MG PO CAPS
50.0000 mg | ORAL_CAPSULE | Freq: Once | ORAL | Status: AC
  Administered 2019-12-12: 50 mg via ORAL

## 2019-12-12 MED ORDER — HEPARIN SOD (PORK) LOCK FLUSH 100 UNIT/ML IV SOLN
500.0000 [IU] | Freq: Once | INTRAVENOUS | Status: AC
  Administered 2019-12-12: 500 [IU] via INTRAVENOUS

## 2019-12-12 MED ORDER — SODIUM CHLORIDE 0.9 % IV SOLN: Freq: Once | INTRAVENOUS | Status: AC

## 2019-12-12 MED ORDER — INSULIN REGULAR HUMAN 100 UNIT/ML IJ SOLN
10.0000 [IU] | Freq: Once | INTRAMUSCULAR | Status: AC
  Administered 2019-12-12: 10 [IU] via INTRAVENOUS
  Filled 2019-12-12: qty 3

## 2019-12-12 MED ORDER — DEXAMETHASONE 4 MG PO TABS
ORAL_TABLET | ORAL | Status: AC
  Filled 2019-12-12: qty 1

## 2019-12-12 NOTE — Progress Notes (Signed)
Patient presents today for treatment. Vital signs stable. Potassium 5.3. Creatinine 5.86. Hgb 7.5. Platelets 52. Labs reviewed by Dr. Delton Coombes. Message received from [place] RN/ Dr. Delton Coombes proceed with treatment today. Give 10 Units of Regular Insulin, 1 amp of D 50. Give Retacrit 20,000.   Treatment given today per MD orders. Tolerated infusion without adverse affects. Vital signs stable. No complaints at this time. Discharged from clinic ambulatory. F/U with [facility] [place] as scheduled.

## 2019-12-12 NOTE — Patient Instructions (Signed)
[facility] at [facility] Discharge Instructions  Labs drawn from portacath today   Thank you for choosing [facility] at [facility] to provide your oncology and hematology care.  To afford each patient quality time with our provider, please arrive at least 15 minutes before your scheduled appointment time.   If you have a lab appointment with the Cancer Center please come in thru the Main Entrance and check in at the main information desk.  You need to re-schedule your appointment should you arrive 10 or more minutes late.  We strive to give you quality time with our providers, and arriving late affects you and other patients whose appointments are after yours.  Also, if you no show three or more times for appointments you may be dismissed from the clinic at the providers discretion.     Again, thank you for choosing [facility].  Our hope is that these requests will decrease the amount of time that you wait before being seen by our physicians.       _____________________________________________________________  Should you have questions after your visit to [facility], please contact our office at [phone] and follow the prompts.  Our office hours are 8:00 a.m. and 4:30 p.m. Monday - Friday.  Please note that voicemails left after 4:00 p.m. may not be returned until the following business day.  We are closed weekends and major holidays.  You do have access to a nurse 24-7, just call the main number to the clinic [phone] and do not press any options, hold on the line and a nurse will answer the phone.    For prescription refill requests, have your pharmacy contact our office and allow 72 hours.    Due to Covid, you will need to wear a mask upon entering the hospital. If you do not have a mask, a mask will be given to you at the Main Entrance upon arrival. For doctor visits, patients may have 1 support person age 18  or older with them. For treatment visits, patients can not have anyone with them due to social distancing guidelines and our immunocompromised population.     

## 2019-12-12 NOTE — Patient Instructions (Signed)
[facility] Discharge Instructions for Patients Receiving Chemotherapy  Today you received the following chemotherapy agents   To help prevent nausea and vomiting after your treatment, we encourage you to take your nausea medication   If you develop nausea and vomiting that is not controlled by your nausea medication, call the clinic.   BELOW ARE SYMPTOMS THAT SHOULD BE REPORTED IMMEDIATELY:  *FEVER GREATER THAN 100.5 F  *CHILLS WITH OR WITHOUT FEVER  NAUSEA AND VOMITING THAT IS NOT CONTROLLED WITH YOUR NAUSEA MEDICATION  *UNUSUAL SHORTNESS OF BREATH  *UNUSUAL BRUISING OR BLEEDING  TENDERNESS IN MOUTH AND THROAT WITH OR WITHOUT PRESENCE OF ULCERS  *URINARY PROBLEMS  *BOWEL PROBLEMS  UNUSUAL RASH Items with * indicate a potential emergency and should be followed up as soon as possible.  Feel free to call the clinic should you have any questions or concerns. The clinic phone number is [phone].  Please show the CHEMO ALERT CARD at check-in to the Emergency Department and triage nurse.   

## 2019-12-19 ENCOUNTER — Other Ambulatory Visit: Payer: Self-pay

## 2019-12-19 ENCOUNTER — Inpatient Hospital Stay (HOSPITAL_COMMUNITY): Payer: Medicare Other

## 2019-12-19 ENCOUNTER — Encounter (HOSPITAL_COMMUNITY): Payer: Self-pay

## 2019-12-19 DIAGNOSIS — D649 Anemia, unspecified: Secondary | ICD-10-CM

## 2019-12-19 DIAGNOSIS — N185 Chronic kidney disease, stage 5: Secondary | ICD-10-CM

## 2019-12-19 DIAGNOSIS — Z5112 Encounter for antineoplastic immunotherapy: Secondary | ICD-10-CM | POA: Diagnosis not present

## 2019-12-19 DIAGNOSIS — C9000 Multiple myeloma not having achieved remission: Secondary | ICD-10-CM

## 2019-12-19 LAB — COMPREHENSIVE METABOLIC PANEL
ALT: 15 U/L (ref 0–44)
AST: 20 U/L (ref 15–41)
Albumin: 3.1 g/dL — ABNORMAL LOW (ref 3.5–5.0)
Alkaline Phosphatase: 79 U/L (ref 38–126)
Anion gap: 11 (ref 5–15)
BUN: 64 mg/dL — ABNORMAL HIGH (ref 8–23)
CO2: 21 mmol/L — ABNORMAL LOW (ref 22–32)
Calcium: 9.8 mg/dL (ref 8.9–10.3)
Chloride: 105 mmol/L (ref 98–111)
Creatinine, Ser: 6.38 mg/dL — ABNORMAL HIGH (ref 0.61–1.24)
GFR calc Af Amer: 10 mL/min — ABNORMAL LOW (ref >60)
GFR calc non Af Amer: 9 mL/min — ABNORMAL LOW (ref >60)
Glucose, Bld: 101 mg/dL — ABNORMAL HIGH (ref 70–99)
Potassium: 5 mmol/L (ref 3.5–5.1)
Sodium: 137 mmol/L (ref 135–145)
Total Bilirubin: 0.8 mg/dL (ref 0.3–1.2)
Total Protein: 5.8 g/dL — ABNORMAL LOW (ref 6.5–8.1)

## 2019-12-19 LAB — CBC WITH DIFFERENTIAL/PLATELET
Abs Immature Granulocytes: 0.04 10*3/uL (ref 0.00–0.07)
Band Neutrophils: 1 %
Basophils Absolute: 0 10*3/uL (ref 0.0–0.1)
Basophils Relative: 1 %
Eosinophils Absolute: 0.2 10*3/uL (ref 0.0–0.5)
Eosinophils Relative: 10 %
HCT: 20.7 % — ABNORMAL LOW (ref 39.0–52.0)
Hemoglobin: 6.8 g/dL — CL (ref 13.0–17.0)
Lymphocytes Relative: 25 %
Lymphs Abs: 0.6 10*3/uL — ABNORMAL LOW (ref 0.7–4.0)
MCH: 41.2 pg — ABNORMAL HIGH (ref 26.0–34.0)
MCHC: 32.9 g/dL (ref 30.0–36.0)
MCV: 125.5 fL — ABNORMAL HIGH (ref 80.0–100.0)
Monocytes Absolute: 0 10*3/uL — ABNORMAL LOW (ref 0.1–1.0)
Monocytes Relative: 2 %
Myelocytes: 1 %
Neutro Abs: 1.4 10*3/uL — ABNORMAL LOW (ref 1.7–7.7)
Neutrophils Relative %: 60 %
Platelets: 40 10*3/uL — ABNORMAL LOW (ref 150–400)
RBC: 1.65 MIL/uL — ABNORMAL LOW (ref 4.22–5.81)
RDW: 15.4 % (ref 11.5–15.5)
WBC: 2.3 10*3/uL — ABNORMAL LOW (ref 4.0–10.5)
nRBC: 0 % (ref 0.0–0.2)

## 2019-12-19 LAB — IRON AND TIBC
Iron: 223 ug/dL — ABNORMAL HIGH (ref 45–182)
Saturation Ratios: 82 % — ABNORMAL HIGH (ref 17.9–39.5)
TIBC: 273 ug/dL (ref 250–450)
UIBC: 50 ug/dL

## 2019-12-19 LAB — FERRITIN: Ferritin: 850 ng/mL — ABNORMAL HIGH (ref 24–336)

## 2019-12-19 LAB — PREPARE RBC (CROSSMATCH)

## 2019-12-19 MED ORDER — DEXAMETHASONE 4 MG PO TABS
20.0000 mg | ORAL_TABLET | Freq: Once | ORAL | Status: AC
  Administered 2019-12-19: 20 mg via ORAL
  Filled 2019-12-19: qty 5

## 2019-12-19 MED ORDER — HEPARIN SOD (PORK) LOCK FLUSH 100 UNIT/ML IV SOLN
500.0000 [IU] | Freq: Once | INTRAVENOUS | Status: AC
  Administered 2019-12-19: 500 [IU] via INTRAVENOUS

## 2019-12-19 MED ORDER — SODIUM CHLORIDE 0.9% FLUSH
10.0000 mL | Freq: Once | INTRAVENOUS | Status: AC
  Administered 2019-12-19: 10 mL via INTRAVENOUS

## 2019-12-19 MED ORDER — DARATUMUMAB-HYALURONIDASE-FIHJ 1800-30000 MG-UT/15ML ~~LOC~~ SOLN
1800.0000 mg | Freq: Once | SUBCUTANEOUS | Status: AC
  Administered 2019-12-19: 1800 mg via SUBCUTANEOUS
  Filled 2019-12-19: qty 15

## 2019-12-19 MED ORDER — DENOSUMAB 120 MG/1.7ML ~~LOC~~ SOLN
120.0000 mg | Freq: Once | SUBCUTANEOUS | Status: AC
  Administered 2019-12-19: 120 mg via SUBCUTANEOUS
  Filled 2019-12-19: qty 1.7

## 2019-12-19 MED ORDER — DIPHENHYDRAMINE HCL 25 MG PO CAPS
50.0000 mg | ORAL_CAPSULE | Freq: Once | ORAL | Status: AC
  Administered 2019-12-19: 25 mg via ORAL
  Filled 2019-12-19: qty 2

## 2019-12-19 MED ORDER — FAMOTIDINE 20 MG PO TABS
20.0000 mg | ORAL_TABLET | Freq: Once | ORAL | Status: AC
  Administered 2019-12-19: 20 mg via ORAL
  Filled 2019-12-19: qty 1

## 2019-12-19 MED ORDER — EPOETIN ALFA-EPBX 10000 UNIT/ML IJ SOLN
10000.0000 [IU] | Freq: Once | INTRAMUSCULAR | Status: AC
  Administered 2019-12-19: 10000 [IU] via SUBCUTANEOUS
  Filled 2019-12-19: qty 1

## 2019-12-19 MED ORDER — DIPHENHYDRAMINE HCL 25 MG PO CAPS
25.0000 mg | ORAL_CAPSULE | Freq: Once | ORAL | Status: AC
  Administered 2019-12-19: 25 mg via ORAL
  Filled 2019-12-19: qty 1

## 2019-12-19 MED ORDER — ACETAMINOPHEN 325 MG PO TABS
650.0000 mg | ORAL_TABLET | Freq: Once | ORAL | Status: AC
  Administered 2019-12-19: 650 mg via ORAL
  Filled 2019-12-19: qty 2

## 2019-12-19 MED ORDER — EPOETIN ALFA-EPBX 20000 UNIT/ML IJ SOLN
20000.0000 [IU] | Freq: Once | INTRAMUSCULAR | Status: AC
  Administered 2019-12-19: 20000 [IU] via SUBCUTANEOUS
  Filled 2019-12-19: qty 1

## 2019-12-19 MED ORDER — SODIUM CHLORIDE 0.9% IV SOLUTION: 250.0000 mL | Freq: Once | INTRAVENOUS | Status: DC

## 2019-12-19 NOTE — Patient Instructions (Signed)
[facility] at [facility] Discharge Instructions  Labs drawn from portacath today   Thank you for choosing [facility] at [facility] to provide your oncology and hematology care.  To afford each patient quality time with our provider, please arrive at least 15 minutes before your scheduled appointment time.   If you have a lab appointment with the Cancer Center please come in thru the Main Entrance and check in at the main information desk.  You need to re-schedule your appointment should you arrive 10 or more minutes late.  We strive to give you quality time with our providers, and arriving late affects you and other patients whose appointments are after yours.  Also, if you no show three or more times for appointments you may be dismissed from the clinic at the providers discretion.     Again, thank you for choosing [facility].  Our hope is that these requests will decrease the amount of time that you wait before being seen by our physicians.       _____________________________________________________________  Should you have questions after your visit to [facility], please contact our office at [phone] and follow the prompts.  Our office hours are 8:00 a.m. and 4:30 p.m. Monday - Friday.  Please note that voicemails left after 4:00 p.m. may not be returned until the following business day.  We are closed weekends and major holidays.  You do have access to a nurse 24-7, just call the main number to the clinic [phone] and do not press any options, hold on the line and a nurse will answer the phone.    For prescription refill requests, have your pharmacy contact our office and allow 72 hours.    Due to Covid, you will need to wear a mask upon entering the hospital. If you do not have a mask, a mask will be given to you at the Main Entrance upon arrival. For doctor visits, patients may have 1 support person age 18  or older with them. For treatment visits, patients can not have anyone with them due to social distancing guidelines and our immunocompromised population.     

## 2019-12-19 NOTE — Patient Instructions (Signed)

## 2019-12-19 NOTE — Progress Notes (Signed)
Labs reviewed , see prior note from Anastasio Champion RN. Proceed as planned .  Delton See, Reticrit, and Darzalex given today  per MD.  Patient tolerated it well without problems. Vitals stable and discharged home from clinic ambulatory in stable condition. Follow up as scheduled.

## 2019-12-19 NOTE — Progress Notes (Signed)
CRITICAL VALUE ALERT  Critical Value:  hgb 6.8  Date & Time Notied:  12/19/2019 at 63  Provider Notified: Delton Coombes, M.D.   Orders Received/Actions taken:  Dr. Delton Coombes aware of hgb 6.8.  Orders rec'd to transfuse 1 unit PRBC today and increase Retacrit dose to 30,000 units [place] weekly.  Orders rec'd to obtain iron/TIBC and ferritin blood work today as well. Okay to proceed with tx today (dara [place]) per MD.

## 2019-12-20 ENCOUNTER — Inpatient Hospital Stay (HOSPITAL_COMMUNITY): Payer: Medicare Other

## 2019-12-20 ENCOUNTER — Encounter (HOSPITAL_COMMUNITY): Payer: Self-pay

## 2019-12-20 VITALS — BP 111/46 | HR 53 | Temp 96.9°F | Resp 17

## 2019-12-20 DIAGNOSIS — C9000 Multiple myeloma not having achieved remission: Secondary | ICD-10-CM

## 2019-12-20 DIAGNOSIS — Z5112 Encounter for antineoplastic immunotherapy: Secondary | ICD-10-CM | POA: Diagnosis not present

## 2019-12-20 MED ORDER — DIPHENHYDRAMINE HCL 25 MG PO CAPS
25.0000 mg | ORAL_CAPSULE | Freq: Once | ORAL | Status: AC
  Administered 2019-12-20: 25 mg via ORAL

## 2019-12-20 MED ORDER — ACETAMINOPHEN 325 MG PO TABS
650.0000 mg | ORAL_TABLET | Freq: Once | ORAL | Status: AC
  Administered 2019-12-20: 650 mg via ORAL

## 2019-12-20 MED ORDER — SODIUM CHLORIDE 0.9% FLUSH
10.0000 mL | INTRAVENOUS | Status: AC | PRN
  Administered 2019-12-20: 10 mL

## 2019-12-20 MED ORDER — SODIUM CHLORIDE 0.9% IV SOLUTION
250.0000 mL | Freq: Once | INTRAVENOUS | Status: AC
  Administered 2019-12-20: 250 mL via INTRAVENOUS

## 2019-12-20 MED ORDER — HEPARIN SOD (PORK) LOCK FLUSH 100 UNIT/ML IV SOLN
500.0000 [IU] | Freq: Every day | INTRAVENOUS | Status: AC | PRN
  Administered 2019-12-20: 500 [IU]

## 2019-12-20 MED ORDER — ACETAMINOPHEN 325 MG PO TABS
ORAL_TABLET | ORAL | Status: AC
  Filled 2019-12-20: qty 2

## 2019-12-20 MED ORDER — DIPHENHYDRAMINE HCL 25 MG PO CAPS
ORAL_CAPSULE | ORAL | Status: AC
  Filled 2019-12-20: qty 1

## 2019-12-20 NOTE — Patient Instructions (Signed)
[facility] Discharge Instructions for Patients Receiving Chemotherapy  Today you received the following chemotherapy agents   To help prevent nausea and vomiting after your treatment, we encourage you to take your nausea medication   If you develop nausea and vomiting that is not controlled by your nausea medication, call the clinic.   BELOW ARE SYMPTOMS THAT SHOULD BE REPORTED IMMEDIATELY:  *FEVER GREATER THAN 100.5 F  *CHILLS WITH OR WITHOUT FEVER  NAUSEA AND VOMITING THAT IS NOT CONTROLLED WITH YOUR NAUSEA MEDICATION  *UNUSUAL SHORTNESS OF BREATH  *UNUSUAL BRUISING OR BLEEDING  TENDERNESS IN MOUTH AND THROAT WITH OR WITHOUT PRESENCE OF ULCERS  *URINARY PROBLEMS  *BOWEL PROBLEMS  UNUSUAL RASH Items with * indicate a potential emergency and should be followed up as soon as possible.  Feel free to call the clinic should you have any questions or concerns. The clinic phone number is [phone].  Please show the CHEMO ALERT CARD at check-in to the Emergency Department and triage nurse.   

## 2019-12-20 NOTE — Progress Notes (Signed)
Patient presents today for 1 Unit of PRBC's. Vital signs stable. Patient has no complaints of any changes since his last treatment.   1 Unit of PRBC's given today per MD orders. Tolerated infusion without adverse affects. Vital signs stable. No complaints at this time. Discharged from clinic ambulatory in stable condition. Alert and oriented x 3. F/U with [facility] as scheduled.

## 2019-12-23 LAB — BPAM RBC
Blood Product Expiration Date: 202110232359
Blood Product Expiration Date: 202110232359
ISSUE DATE / TIME: 202109211432
ISSUE DATE / TIME: 202109211432
Unit Type and Rh: 5100
Unit Type and Rh: 5100

## 2019-12-23 LAB — TYPE AND SCREEN
ABO/RH(D): A POS
Antibody Screen: POSITIVE
Unit division: 0
Unit division: 0

## 2019-12-26 ENCOUNTER — Inpatient Hospital Stay (HOSPITAL_BASED_OUTPATIENT_CLINIC_OR_DEPARTMENT_OTHER): Payer: Medicare Other | Admitting: Hematology

## 2019-12-26 ENCOUNTER — Inpatient Hospital Stay (HOSPITAL_COMMUNITY): Payer: Medicare Other

## 2019-12-26 ENCOUNTER — Other Ambulatory Visit: Payer: Self-pay

## 2019-12-26 ENCOUNTER — Other Ambulatory Visit (HOSPITAL_COMMUNITY): Payer: Self-pay | Admitting: *Deleted

## 2019-12-26 VITALS — BP 109/58 | HR 63 | Temp 96.9°F | Resp 18 | Wt 148.0 lb

## 2019-12-26 VITALS — BP 124/40 | HR 74 | Temp 98.4°F | Resp 18

## 2019-12-26 DIAGNOSIS — C9000 Multiple myeloma not having achieved remission: Secondary | ICD-10-CM

## 2019-12-26 DIAGNOSIS — N185 Chronic kidney disease, stage 5: Secondary | ICD-10-CM

## 2019-12-26 DIAGNOSIS — Z5112 Encounter for antineoplastic immunotherapy: Secondary | ICD-10-CM | POA: Diagnosis not present

## 2019-12-26 LAB — COMPREHENSIVE METABOLIC PANEL
ALT: 18 U/L (ref 0–44)
AST: 59 U/L — ABNORMAL HIGH (ref 15–41)
Albumin: 3.1 g/dL — ABNORMAL LOW (ref 3.5–5.0)
Alkaline Phosphatase: 98 U/L (ref 38–126)
Anion gap: 13 (ref 5–15)
BUN: 76 mg/dL — ABNORMAL HIGH (ref 8–23)
CO2: 20 mmol/L — ABNORMAL LOW (ref 22–32)
Calcium: 8.7 mg/dL — ABNORMAL LOW (ref 8.9–10.3)
Chloride: 99 mmol/L (ref 98–111)
Creatinine, Ser: 6.43 mg/dL — ABNORMAL HIGH (ref 0.61–1.24)
GFR calc Af Amer: 10 mL/min — ABNORMAL LOW (ref >60)
GFR calc non Af Amer: 9 mL/min — ABNORMAL LOW (ref >60)
Glucose, Bld: 90 mg/dL (ref 70–99)
Potassium: 4.9 mmol/L (ref 3.5–5.1)
Sodium: 132 mmol/L — ABNORMAL LOW (ref 135–145)
Total Bilirubin: 1.4 mg/dL — ABNORMAL HIGH (ref 0.3–1.2)
Total Protein: 5.8 g/dL — ABNORMAL LOW (ref 6.5–8.1)

## 2019-12-26 LAB — CBC WITH DIFFERENTIAL/PLATELET
Abs Immature Granulocytes: 0 10*3/uL (ref 0.00–0.07)
Basophils Absolute: 0 10*3/uL (ref 0.0–0.1)
Basophils Relative: 1 %
Eosinophils Absolute: 0.1 10*3/uL (ref 0.0–0.5)
Eosinophils Relative: 7 %
HCT: 21.2 % — ABNORMAL LOW (ref 39.0–52.0)
Hemoglobin: 7.1 g/dL — ABNORMAL LOW (ref 13.0–17.0)
Immature Granulocytes: 0 %
Lymphocytes Relative: 36 %
Lymphs Abs: 0.3 10*3/uL — ABNORMAL LOW (ref 0.7–4.0)
MCH: 37.6 pg — ABNORMAL HIGH (ref 26.0–34.0)
MCHC: 33.5 g/dL (ref 30.0–36.0)
MCV: 112.2 fL — ABNORMAL HIGH (ref 80.0–100.0)
Monocytes Absolute: 0.3 10*3/uL (ref 0.1–1.0)
Monocytes Relative: 34 %
Neutro Abs: 0.2 10*3/uL — ABNORMAL LOW (ref 1.7–7.7)
Neutrophils Relative %: 22 %
Platelets: 14 10*3/uL — CL (ref 150–400)
RBC: 1.89 MIL/uL — ABNORMAL LOW (ref 4.22–5.81)
WBC: 0.7 10*3/uL — CL (ref 4.0–10.5)
nRBC: 0 % (ref 0.0–0.2)

## 2019-12-26 LAB — LACTATE DEHYDROGENASE: LDH: 1272 U/L — ABNORMAL HIGH (ref 98–192)

## 2019-12-26 MED ORDER — EPOETIN ALFA-EPBX 20000 UNIT/ML IJ SOLN
20000.0000 [IU] | Freq: Once | INTRAMUSCULAR | Status: AC
  Administered 2019-12-26: 20000 [IU] via SUBCUTANEOUS
  Filled 2019-12-26: qty 1

## 2019-12-26 MED ORDER — SODIUM CHLORIDE 0.9% IV SOLUTION
250.0000 mL | Freq: Once | INTRAVENOUS | Status: AC
  Administered 2019-12-26: 250 mL via INTRAVENOUS

## 2019-12-26 MED ORDER — FILGRASTIM-SNDZ 480 MCG/0.8ML IJ SOSY
480.0000 ug | PREFILLED_SYRINGE | Freq: Once | INTRAMUSCULAR | Status: AC
  Administered 2019-12-26: 480 ug via SUBCUTANEOUS
  Filled 2019-12-26: qty 0.8

## 2019-12-26 MED ORDER — HEPARIN SOD (PORK) LOCK FLUSH 100 UNIT/ML IV SOLN
500.0000 [IU] | Freq: Every day | INTRAVENOUS | Status: AC | PRN
  Administered 2019-12-26: 500 [IU]

## 2019-12-26 MED ORDER — EPOETIN ALFA-EPBX 10000 UNIT/ML IJ SOLN
10000.0000 [IU] | Freq: Once | INTRAMUSCULAR | Status: AC
  Administered 2019-12-26: 10000 [IU] via SUBCUTANEOUS
  Filled 2019-12-26: qty 1

## 2019-12-26 MED ORDER — ACETAMINOPHEN 325 MG PO TABS
650.0000 mg | ORAL_TABLET | Freq: Once | ORAL | Status: AC
  Administered 2019-12-26: 650 mg via ORAL
  Filled 2019-12-26: qty 2

## 2019-12-26 MED ORDER — HEPARIN SOD (PORK) LOCK FLUSH 100 UNIT/ML IV SOLN: 250.0000 [IU] | INTRAVENOUS | Status: AC | PRN

## 2019-12-26 MED ORDER — DIPHENHYDRAMINE HCL 25 MG PO CAPS
25.0000 mg | ORAL_CAPSULE | Freq: Once | ORAL | Status: AC
  Administered 2019-12-26: 25 mg via ORAL
  Filled 2019-12-26: qty 1

## 2019-12-26 MED ORDER — SODIUM CHLORIDE 0.9% FLUSH
10.0000 mL | INTRAVENOUS | Status: AC | PRN
  Administered 2019-12-26: 10 mL

## 2019-12-26 MED ORDER — HYDROMORPHONE HCL 4 MG PO TABS
4.0000 mg | ORAL_TABLET | ORAL | 0 refills | Status: DC | PRN
Start: 2019-12-26 — End: 2020-01-19

## 2019-12-26 NOTE — Progress Notes (Signed)
CRITICAL VALUE STICKER  CRITICAL VALUE: WBC- 0.7                                PLT- 14  RECEIVER (on-site recipient of call): Satira Anis, LPN  DATE & TIME NOTIFIED: 12/26/2019 @ 10:45am  MESSENGER (representative from lab): KAY from lab   MD NOTIFIED: Delton Coombes   TIME OF NOTIFICATION: 10:48am  RESPONSE: MD notified with no orders received

## 2019-12-26 NOTE — Patient Instructions (Signed)
[facility] Discharge Instructions for Patients Receiving Chemotherapy   Beginning January 23rd 2017 lab work for the Cancer Center will be done in the  Main lab at [place] on 1st floor. If you have a lab appointment with the Cancer Center please come in thru the  Main Entrance and check in at the main information desk   Today you received the following chemotherapy agents Daratumumab  To help prevent nausea and vomiting after your treatment, we encourage you to take your nausea medication   If you develop nausea and vomiting, or diarrhea that is not controlled by your medication, call the clinic.  The clinic phone number is [phone]. Office hours are Monday-Friday 8:30am-5:00pm.  BELOW ARE SYMPTOMS THAT SHOULD BE REPORTED IMMEDIATELY:  *FEVER GREATER THAN 101.0 F  *CHILLS WITH OR WITHOUT FEVER  NAUSEA AND VOMITING THAT IS NOT CONTROLLED WITH YOUR NAUSEA MEDICATION  *UNUSUAL SHORTNESS OF BREATH  *UNUSUAL BRUISING OR BLEEDING  TENDERNESS IN MOUTH AND THROAT WITH OR WITHOUT PRESENCE OF ULCERS  *URINARY PROBLEMS  *BOWEL PROBLEMS  UNUSUAL RASH Items with * indicate a potential emergency and should be followed up as soon as possible. If you have an emergency after office hours please contact your primary care physician or go to the nearest emergency department.  Please call the clinic during office hours if you have any questions or concerns.   You may also contact the Patient Navigator at [phone] should you have any questions or need assistance in obtaining follow up care.      Resources For Cancer Patients and their Caregivers ? [employer]: Can assist with transportation, wigs, general needs, runs Look Good Feel Better.        1-888-227-6333 ? Cancer Care: Provides financial assistance, online support groups, medication/co-pay assistance.  1-800-813-HOPE (4673) ? [facility] Assists [place]  cancer patients and their families through emotional , educational and financial support.  336-427-4357 ? [place] DSS Where to apply for food stamps, Medicaid and utility assistance. 336-342-1394 ? [facility]: Transportation to medical appointments. 336-347-2287 ? Social Security Administration: May apply for disability if have a Stage IV cancer. 336-342-7796 1-800-772-1213 ? [place] Aging, Disability and Transit Services: Assists with nutrition, care and transit needs. 336-349-2343          

## 2019-12-26 NOTE — Progress Notes (Signed)
Per Dr. Delton Coombes, hold treatment today, give 1 unit of platelets and neupogen 480 mg today and recheck patient next week.

## 2019-12-26 NOTE — Progress Notes (Signed)
[name] presents today for [place].Lab results and vitals have been reviewed, including hgb 7.1, ANC 0.2, and plt 14. Patient has been assessed by Dr. Delton Coombes who wants to hold treatment today. Orders obtained to transfuse one unit platelets today and to give Neupogen 480mg  SQ once. Pt to return next week for labs and follow up per MD.  Transfusion tolerated without incident or complaint. VSS upon completion of treatment. Port flushed and deaccessed per protocol, see MAR and IV flowsheet for details. Discharged in satisfactory condition with follow up instructions.

## 2019-12-26 NOTE — Progress Notes (Signed)
[place],  [id number]   CLINIC:  Medical Oncology/Hematology  PCP:  Rosita Fire, MD [place] / [place] [address] [phone]   REASON FOR VISIT:  Follow-up for kappa light chain myeloma  PRIOR THERAPY:  1. CyBorD from 06/17/2018 to 04/04/2019. 2. Carfilzomib x 7 cycles from 04/21/2019 to 11/01/2019.  NGS Results: Not done  CURRENT THERAPY: Darzalex Faspro weekly; Pomalyst 3 weeks on, 1 week off  BRIEF ONCOLOGIC HISTORY:  Oncology History  Kappa light chain myeloma ([place])  06/16/2018 Initial Diagnosis   Kappa light chain myeloma ([place])   06/17/2018 - 04/10/2019 Chemotherapy   The patient had palonosetron (ALOXI) injection 0.25 mg, 0.25 mg, Intravenous,  Once, 27 of 27 cycles Administration: 0.25 mg (06/17/2018), 0.25 mg (06/23/2018), 0.25 mg (06/30/2018), 0.25 mg (07/07/2018), 0.25 mg (07/14/2018), 0.25 mg (07/22/2018), 0.25 mg (07/29/2018), 0.25 mg (08/06/2018), 0.25 mg (08/13/2018), 0.25 mg (08/20/2018), 0.25 mg (08/27/2018), 0.25 mg (09/06/2018), 0.25 mg (09/13/2018), 0.25 mg (09/20/2018), 0.25 mg (09/27/2018), 0.25 mg (10/04/2018), 0.25 mg (10/11/2018), 0.25 mg (10/18/2018), 0.25 mg (10/25/2018), 0.25 mg (11/01/2018), 0.25 mg (11/08/2018), 0.25 mg (11/15/2018), 0.25 mg (11/22/2018), 0.25 mg (11/29/2018), 0.25 mg (12/08/2018), 0.25 mg (12/16/2018) bortezomib SQ (VELCADE) chemo injection 2.5 mg, 1.3 mg/m2 = 2.5 mg, Subcutaneous,  Once, 41 of 42 cycles Administration: 2.5 mg (06/17/2018), 2.5 mg (06/23/2018), 2.5 mg (06/30/2018), 2.5 mg (07/07/2018), 2.5 mg (07/14/2018), 2.5 mg (07/22/2018), 2.5 mg (07/29/2018), 2.5 mg (08/06/2018), 2.5 mg (08/13/2018), 2.5 mg (08/20/2018), 2.5 mg (08/27/2018), 2.5 mg (09/06/2018), 2.5 mg (09/13/2018), 2.5 mg (09/20/2018), 2.5 mg (09/27/2018), 2.5 mg (10/04/2018), 2.5 mg (10/11/2018), 2.5 mg (10/18/2018), 2.5 mg (10/25/2018), 2.5 mg (11/01/2018), 2.5 mg (11/08/2018), 2.5 mg (11/15/2018), 2.5 mg (11/22/2018), 2.5 mg (11/29/2018), 2.5 mg (12/08/2018),  2.5 mg (12/16/2018), 2.5 mg (12/27/2018), 2.5 mg (01/03/2019), 2.5 mg (01/10/2019), 2.5 mg (01/17/2019), 2.5 mg (01/24/2019), 2.5 mg (01/31/2019), 2.5 mg (02/07/2019), 2.5 mg (02/14/2019), 2.5 mg (02/21/2019), 2.5 mg (02/28/2019), 2.5 mg (03/07/2019), 2.5 mg (03/14/2019), 2.5 mg (03/21/2019), 2.5 mg (03/29/2019), 2.5 mg (04/04/2019) cyclophosphamide (CYTOXAN) 300 mg in sodium chloride 0.9 % 250 mL chemo infusion, 300 mg (100 % of original dose 300 mg), Intravenous,  Once, 26 of 26 cycles Dose modification: 300 mg (original dose 300 mg, Cycle 1, Reason: Change in SCr/CrCl), 500 mg (original dose 500 mg, Cycle 2, Reason: Provider Judgment), 500 mg (original dose 500 mg, Cycle 3, Reason: Change in SCr/CrCl) Administration: 300 mg (06/17/2018), 500 mg (06/23/2018), 500 mg (06/30/2018), 600 mg (07/07/2018), 600 mg (07/14/2018), 600 mg (07/22/2018), 600 mg (07/29/2018), 600 mg (08/06/2018), 600 mg (08/13/2018), 600 mg (08/20/2018), 600 mg (08/27/2018), 600 mg (09/06/2018), 600 mg (09/13/2018), 600 mg (09/20/2018), 600 mg (09/27/2018), 600 mg (10/04/2018), 600 mg (10/11/2018), 600 mg (10/18/2018), 600 mg (10/25/2018), 600 mg (11/01/2018), 600 mg (11/08/2018), 600 mg (11/15/2018), 600 mg (11/22/2018), 600 mg (11/29/2018), 600 mg (12/08/2018), 600 mg (12/16/2018)  for chemotherapy treatment.    04/21/2019 - 11/01/2019 Chemotherapy   The patient had ondansetron (ZOFRAN) injection 4 mg, 4 mg (100 % of original dose 4 mg), Intravenous,  Once, 6 of 7 cycles Dose modification: 8 mg (original dose 4 mg, Cycle 2), 4 mg (original dose 4 mg, Cycle 2), 4 mg (original dose 4 mg, Cycle 2) Administration: 4 mg (06/09/2019), 4 mg (06/10/2019), 4 mg (06/23/2019), 4 mg (06/24/2019), 4 mg (07/04/2019), 4 mg (07/05/2019), 4 mg (07/11/2019), 4 mg (07/12/2019), 4 mg (08/22/2019), 4 mg (08/23/2019), 4 mg (08/30/2019), 4 mg (08/31/2019), 4  mg (09/06/2019), 4 mg (09/07/2019), 4 mg (07/25/2019), 4 mg (07/26/2019), 4 mg (08/01/2019), 4 mg (08/02/2019), 4 mg (09/19/2019), 4 mg (09/20/2019), 4 mg (09/26/2019), 4  mg (09/27/2019), 4 mg (10/04/2019), 4 mg (10/05/2019), 4 mg (10/17/2019), 4 mg (10/18/2019), 4 mg (10/24/2019), 4 mg (10/25/2019), 4 mg (10/31/2019), 4 mg (11/01/2019) carfilzomib (KYPROLIS) 40 mg in dextrose 5 % 50 mL chemo infusion, 20 mg/m2 = 40 mg, Intravenous,  Once, 7 of 8 cycles Dose modification: 27 mg/m2 (original dose 27 mg/m2, Cycle 1, Reason: Provider Judgment) Administration: 40 mg (04/21/2019), 40 mg (04/22/2019), 60 mg (04/28/2019), 60 mg (04/29/2019), 60 mg (05/05/2019), 60 mg (05/06/2019), 56 mg (05/26/2019), 56 mg (05/27/2019), 56 mg (06/02/2019), 56 mg (06/03/2019), 56 mg (06/09/2019), 56 mg (06/10/2019), 56 mg (08/22/2019), 56 mg (08/23/2019), 56 mg (08/30/2019), 56 mg (08/31/2019), 56 mg (09/06/2019), 56 mg (09/07/2019), 56 mg (06/23/2019), 56 mg (06/24/2019), 56 mg (07/04/2019), 56 mg (07/05/2019), 56 mg (07/11/2019), 56 mg (07/12/2019), 56 mg (07/25/2019), 56 mg (07/26/2019), 56 mg (08/01/2019), 56 mg (08/02/2019), 56 mg (09/19/2019), 56 mg (09/20/2019), 56 mg (09/26/2019), 56 mg (09/27/2019), 56 mg (10/04/2019), 56 mg (10/05/2019), 56 mg (10/17/2019), 56 mg (10/18/2019), 56 mg (10/24/2019), 56 mg (10/25/2019), 56 mg (10/31/2019), 56 mg (11/01/2019)  for chemotherapy treatment.    11/21/2019 -  Chemotherapy   The patient had dexamethasone (DECADRON) 4 MG tablet, 40 mg (100 % of original dose 40 mg), Oral, Every 7 days, 3 of 11 cycles Dose modification: 40 mg (original dose 40 mg, Cycle 0) Administration: 20 mg (11/21/2019), 20 mg (11/28/2019), 20 mg (12/06/2019), 20 mg (12/12/2019), 20 mg (12/19/2019) daratumumab-hyaluronidase-fihj (DARZALEX FASPRO) 1800-30000 MG-UT/15ML chemo SQ injection 1,800 mg, 1,800 mg, Subcutaneous,  Once, 2 of 10 cycles Administration: 1,800 mg (11/21/2019), 1,800 mg (11/28/2019), 1,800 mg (12/06/2019), 1,800 mg (12/12/2019), 1,800 mg (12/19/2019)  for chemotherapy treatment.      CANCER STAGING: Cancer Staging No matching staging information was found for the patient.  INTERVAL HISTORY:  Mr. Willet Schleifer, a 59 y.o. male,  returns for routine follow-up and consideration for next cycle of chemotherapy. Rory was last seen on 11/28/2019.  Due for day #8 of cycle #2 of Darzalex Faspro today.   Overall, he tells me he has been feeling fine. He reports that he felt weak and had body pains all over yesterday, but denies fatigued. He is currently taking Pomalyst every week and reports that he was not told to take 1 week off the Pomalyst; he denies having N/V/D or fatigue. He takes Dilaudid every 3 hours for his back pain. He continues coughing up white sputum.  He received his second [place] vaccine on 8/18.   REVIEW OF SYSTEMS:  Review of Systems  Constitutional: Positive for appetite change (severely decreased) and fatigue (severe).  Respiratory: Positive for cough (white sputum).   Cardiovascular: Positive for leg swelling.  Gastrointestinal: Positive for constipation.  Musculoskeletal: Positive for back pain (10/10 lower back pain).  Neurological: Positive for dizziness and extremity weakness (by end of week).  Psychiatric/Behavioral: Positive for depression. The patient is nervous/anxious.   All other systems reviewed and are negative.   PAST MEDICAL/SURGICAL HISTORY:  Past Medical History:  Diagnosis Date  . Allergy   . Arthritis    neck and back  . Blood transfusion without reported diagnosis   . Bone metastases ([place]) 09/19/2019  . BPH (benign prostatic hyperplasia)   . Cancer [facility]) 2004   testicle  . Chronic kidney disease    kidney stones  . Left  lumbar radiculopathy 06/12/2016  . 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 Medical history non-contributory    Pt has scattered thoughts and uncertain of past medical history  . 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 Stones, urinary tract   . Substance abuse ([place])    prescribed oxydcodone- 10 years   Past Surgical History:  Procedure Laterality Date  . BACK SURGERY     x5  . CERVICAL DISC SURGERY     x2  . COLONOSCOPY WITH PROPOFOL N/A  08/11/2016   Dr. Gala Romney: non-bleeding internal hemorrhoids, one 4 mm hyperplastic rectal polyp, diverticulosis in entire examined colon  . POLYPECTOMY  08/11/2016   Procedure: POLYPECTOMY;  Surgeon: [name], MD;  Location: [facility] ENDO SUITE;  Service: Endoscopy;;  colon  . PORTACATH PLACEMENT Left 09/20/2018   Procedure: INSERTION PORT-A-CATH (catheter attached left subclavian);  Surgeon: Virl Cagey, MD;  Location: [facility] ORS;  Service: General;  Laterality: Left;  . testicular cancer  2004  . TONSILLECTOMY      SOCIAL HISTORY:  Social History   Socioeconomic History  . Marital status: Single    Spouse name: Not on file  . Number of children: 1  . Years of education: 51  . Highest education level: Not on file  Occupational History  . Occupation: retired    Comment: Psychologist, prison and probation services  . Occupation: disabled  Tobacco Use  . Smoking status: Current Every Day Smoker    Packs/day: 1.00    Years: 40.00    Pack years: 40.00    Types: Cigarettes    Start date: 03/31/1974  . Smokeless tobacco: Never Used  Vaping Use  . Vaping Use: Never used  Substance and Sexual Activity  . Alcohol use: Yes    Comment: Occasional  . Drug use: No    Types: Cocaine    Comment: Remote hx of cocaine, quit 1989  . Sexual activity: Yes  Other Topics Concern  . Not on file  Social History Narrative   [employer] for 12 years   [employer] for 17 years      Never married   One daughter      Lives alone   Retail banker   Right-handed   Occasional caffeine use         Social Determinants of Health   Financial Resource Strain:   . Difficulty of Paying Living Expenses: Not on file  Food Insecurity:   . Worried About Charity fundraiser in the Last Year: Not on file  . Ran Out of Food in the Last Year: Not on file  Transportation Needs:   . Lack of Transportation (Medical): Not on file  . Lack of Transportation (Non-Medical): Not on file  Physical Activity:   . Days of Exercise  per Week: Not on file  . Minutes of Exercise per Session: Not on file  Stress:   . Feeling of Stress : Not on file  Social Connections:   . Frequency of Communication with Friends and Family: Not on file  . Frequency of Social Gatherings with Friends and Family: Not on file  . Attends Religious Services: Not on file  . Active Member of Clubs or Organizations: Not on file  . Attends Archivist Meetings: Not on file  . Marital Status: Not on file  Intimate Partner Violence:   . Fear of Current or Ex-Partner: Not on file  . Emotionally Abused: Not on file  . Physically Abused: Not on file  .  Sexually Abused: Not on file    FAMILY HISTORY:  Family History  Problem Relation Age of Onset  . COPD Mother   . Heart disease Mother   . Other Father        Never knew his father.  . Thyroid disease Sister   . Arthritis Sister   . Heart disease Sister        bypass  . Colon cancer Neg Hx     CURRENT MEDICATIONS:  Current Outpatient Medications  Medication Sig Dispense Refill  . aspirin EC 81 MG tablet Take 81 mg by mouth daily.    . calcitRIOL (ROCALTROL) 0.5 MCG capsule Take 0.5 mcg by mouth daily.     Marland Kitchen dexamethasone (DECADRON) 4 MG tablet Take 10 tablets (40 mg total) by mouth every 7 (seven) days. Take for 2 days starting the night of chemotherapy. 40 tablet 6  . Epoetin Alfa (PROCRIT IJ) Inject as directed. Unsure of dosage- gets this once weekly    . HYDROmorphone (DILAUDID) 4 MG tablet Take 1 tablet (4 mg total) by mouth every 3 (three) hours as needed for severe pain. 240 tablet 0  . Patiromer Sorbitex Calcium (VELTASSA PO) Take by mouth 2 (two) times a week.     . pomalidomide (POMALYST) 3 MG capsule Take 1 capsule (3 mg total) by mouth daily. 21 capsule 0  . prochlorperazine (COMPAZINE) 5 MG tablet Take 1 tablet (5 mg total) by mouth every 6 (six) hours as needed for nausea or vomiting. 30 tablet 2  . senna (SENOKOT) 8.6 MG tablet Take 1 tablet by mouth 2 (two) times  daily.     . sodium bicarbonate 650 MG tablet Take 1,300 mg by mouth 2 (two) times daily.    Marland Kitchen torsemide (DEMADEX) 20 MG tablet TAKING 1 TABLET DAILY, AND 2 TABLETS ONLY IF HIS ANKLES ARE REALLY SWOLLEN 180 tablet 1  . valACYclovir (VALTREX) 500 MG tablet TAKE 1 TABLET BY MOUTH TWICE A DAY 180 tablet 2   No current facility-administered medications for this visit.   Facility-Administered Medications Ordered in Other Visits  Medication Dose Route Frequency Provider Last Rate Last Admin  . 0.9 %  sodium chloride infusion (Manually program via Guardrails IV Fluids)  250 mL Intravenous Once Derek Jack, MD      . acetaminophen (TYLENOL) tablet 650 mg  650 mg Oral Once Derek Jack, MD      . diphenhydrAMINE (BENADRYL) capsule 25 mg  25 mg Oral Once Derek Jack, MD      . [facility]) injection 480 mcg  480 mcg Subcutaneous Once Derek Jack, MD      . heparin lock flush 100 unit/mL  500 Units Intracatheter Daily PRN Derek Jack, MD      . heparin lock flush 100 unit/mL  250 Units Intracatheter PRN Derek Jack, MD      . sodium chloride flush (NS) 0.9 % injection 10 mL  10 mL Intracatheter PRN Derek Jack, MD        ALLERGIES:  Allergies  Allergen Reactions  . Oxycodone-Acetaminophen   . Acetaminophen Nausea Only and Other (See Comments)    Elevated liver enzymes  . Cymbalta [Duloxetine Hcl] Swelling    Facial swelling  . Diclofenac Sodium Swelling       . Diclofenac Sodium Swelling  . Imodium [Loperamide] Swelling    facial  . Lyrica [Pregabalin] Swelling  . Morphine Other (See Comments)    Bradycardia   . Vioxx [Rofecoxib] Swelling  .  Aleve [Naproxen] Swelling    eye    PHYSICAL EXAM:  Performance status (ECOG): 1 - Symptomatic but completely ambulatory  Vitals:   12/26/19 0927  BP: (!) 109/58  Pulse: 63  Resp: 18  Temp: (!) 96.9 F (36.1 C)  SpO2: 100%   Wt Readings from Last 3 Encounters:   12/26/19 148 lb (67.1 kg)  12/19/19 150 lb 12.8 oz (68.4 kg)  12/12/19 156 lb 6.4 oz (70.9 kg)   Physical Exam Vitals reviewed.  Constitutional:      Appearance: Normal appearance.  Cardiovascular:     Rate and Rhythm: Normal rate and regular rhythm.     Pulses: Normal pulses.     Heart sounds: Normal heart sounds.  Pulmonary:     Effort: Pulmonary effort is normal.     Breath sounds: Normal breath sounds.  Chest:     Comments: Port-a-Cath in L chest Musculoskeletal:     Right lower leg: No edema.     Left lower leg: No edema.  Neurological:     General: No focal deficit present.     Mental Status: He is alert and oriented to person, place, and time.  Psychiatric:        Mood and Affect: Mood normal.        Behavior: Behavior normal.     LABORATORY DATA:  I have reviewed the labs as listed.  CBC Latest Ref Rng & Units 12/26/2019 12/19/2019 12/12/2019  WBC 4.0 - 10.5 K/uL 0.7(LL) 2.3(L) 4.4  Hemoglobin 13.0 - 17.0 g/dL 7.1(L) 6.8(LL) 7.5(L)  Hematocrit 39 - 52 % 21.2(L) 20.7(L) 22.1(L)  Platelets 150 - 400 K/uL 14(LL) 40(L) 52(L)   CMP Latest Ref Rng & Units 12/26/2019 12/19/2019 12/12/2019  Glucose 70 - 99 mg/dL 90 101(H) 91  BUN 8 - 23 mg/dL 76(H) 64(H) 59(H)  Creatinine 0.61 - 1.24 mg/dL 6.43(H) 6.38(H) 5.86(H)  Sodium 135 - 145 mmol/L 132(L) 137 134(L)  Potassium 3.5 - 5.1 mmol/L 4.9 5.0 5.3(H)  Chloride 98 - 111 mmol/L 99 105 104  CO2 22 - 32 mmol/L 20(L) 21(L) 19(L)  Calcium 8.9 - 10.3 mg/dL 8.7(L) 9.8 9.1  Total Protein 6.5 - 8.1 g/dL 5.8(L) 5.8(L) 5.6(L)  Total Bilirubin 0.3 - 1.2 mg/dL 1.4(H) 0.8 0.8  Alkaline Phos 38 - 126 U/L 98 79 93  AST 15 - 41 U/L 59(H) 20 24  ALT 0 - 44 U/L 18 15 15    Lab Results  Component Value Date   LDH 1,272 (H) 12/26/2019   LDH 581 (H) 12/12/2019   LDH 233 (H) 11/14/2019    DIAGNOSTIC IMAGING:  I have independently reviewed the scans and discussed with the patient. No results found.   ASSESSMENT:  1. Kappa light chain  myeloma, high risk, stage III: -CyBorD from 06/17/2018 through 04/04/2019 with progression. -He was evaluated for bone marrow [facility] at [facility] but has refused it. -Carfilzomib, pomalidomide and dexamethasone from 04/21/2019. -Myeloma panel from 08/08/2019 shows M spike 0.1 g. Previously this was not detectable. Kappa light chains are 561, lambda light chains 36.9 and ratio 15.2. Ratio was previously 11.2 and prior to that 16.1. -We had to held his carfilzomib day 15 and 16 of his last cycle because of elevated creatinine. -Daratumumab pomalidomide dexamethasone started on 11/21/2019.  2. Back pain: -MRI of the lumbar and thoracic spine on 04/20/2019 showed ventral epidural tumor posterior to the T12 vertebral body with mild spinal stenosis. -XRT to the back from 05/22/2019 through 05/27/2019. -MRI  of the thoracic and lumbar spine on 10/06/2019 shows T7, T10, T11 and T12 lesions are slightly better seen on the study as the prior study was degraded by motion artifact. No new findings. Overall grossly stable appearing myelomatous changes at T12, L2 and L3 with no progressive findings. Right seventh and ninth posterior rib lesions are probably stable. Right seventh posterior rib lesion may be larger or just better seen. There does appear to be a new adjacent soft tissue component.   PLAN:  1. Kappa light chain myeloma: -He was reportedly taking pomalidomide without a break. -Labs today revealed white count 0.7 and platelet count 14.  Hemoglobin was 7.1.  Creatinine was 6.43. -I have held his treatment today.  He was told to stop Pomalyst. -We will give him Neupogen today along with platelet transfusion. -We will reevaluate him in 1 week.  We will check LDH today as previous value was high. -We will follow up on myeloma labs from today.   2. Back pain: -He has chronic back pain and right lateral rib pain. -Continue Dilaudid 4 mg every 3 hours.  3. 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  Hemoglobin is 7.1. -Continue Retacrit.  4. Myeloma bone disease: -Continue monthly denosumab.  Calcium is 8.7.  5. ID/thromboprophylaxis: -Continue Valtrex and aspirin.  6. Irregular heart rate: -Last EKG showed prolonged QTC and frequent PVCs.  Cardiology referral was made.   Orders placed this encounter:  No orders of the defined types were placed in this encounter.    Derek Jack, MD [place] [phone]   I, Milinda Antis, am acting as a scribe for Dr. Sanda Linger.  I, Derek Jack MD, have reviewed the above documentation for accuracy and completeness, and I agree with the above.

## 2019-12-26 NOTE — Patient Instructions (Signed)
[place] at [facility] Discharge Instructions  You were seen today by Dr. Delton Coombes. He went over your recent results. Stop taking the Pomalyst; you will have your labs checked in 1 week. Dr. Delton Coombes will see you back in 4 weeks for labs and follow up.   Thank you for choosing [place] at [facility] to provide your oncology and hematology care.  To afford each patient quality time with our provider, please arrive at least 15 minutes before your scheduled appointment time.   If you have a lab appointment with the [facility] please come in thru the Main Entrance and check in at the main information desk  You need to re-schedule your appointment should you arrive 10 or more minutes late.  We strive to give you quality time with our providers, and arriving late affects you and other patients whose appointments are after yours.  Also, if you no show three or more times for appointments you may be dismissed from the clinic at the providers discretion.     Again, thank you for choosing [facility].  Our hope is that these requests will decrease the amount of time that you wait before being seen by our physicians.       _____________________________________________________________  Should you have questions after your visit to [facility], please contact our office at [phone] between the hours of 8:00 a.m. and 4:30 p.m.  Voicemails left after 4:00 p.m. will not be returned until the following business day.  For prescription refill requests, have your pharmacy contact our office and allow 72 hours.    Cancer Center Support Programs:   > Cancer Support Group  2nd Tuesday of the month 1pm-2pm, Journey Room

## 2019-12-26 NOTE — Patient Instructions (Signed)
[facility] at [facility] Discharge Instructions  Labs drawn from portacath today   Thank you for choosing  Can[facility] Center at [facility] to provide your oncology and hematology care.  To afford each patient quality time with our provider, please arrive at least 15 minutes before your scheduled appointment time.   If you have a lab appointment with the Cancer Center please come in thru the Main Entrance and check in at the main information desk.  You need to re-schedule your appointment should you arrive 10 or more minutes late.  We strive to give you quality time with our providers, and arriving late affects you and other patients whose appointments are after yours.  Also, if you no show three or more times for appointments you may be dismissed from the clinic at the providers discretion.     Again, thank you for choosing [facility].  Our hope is that these requests will decrease the amount of time that you wait before being seen by our physicians.       _____________________________________________________________  Should you have questions after your visit to [facility], please contact our office at [phone] and follow the prompts.  Our office hours are 8:00 a.m. and 4:30 p.m. Monday - Friday.  Please note that voicemails left after 4:00 p.m. may not be returned until the following business day.  We are closed weekends and major holidays.  You do have access to a nurse 24-7, just call the main number to the clinic [phone] and do not press any options, hold on the line and a nurse will answer the phone.    For prescription refill requests, have your pharmacy contact our office and allow 72 hours.    Due to Covid, you will need to wear a mask upon entering the hospital. If you do not have a mask, a mask will be given to you at the Main Entrance upon arrival. For doctor visits, patients may have 1 support person age 18  or older with them. For treatment visits, patients can not have anyone with them due to social distancing guidelines and our immunocompromised population.     

## 2019-12-27 LAB — PREPARE PLATELET PHERESIS: Unit division: 0

## 2019-12-27 LAB — BPAM PLATELET PHERESIS
Blood Product Expiration Date: 202109302359
ISSUE DATE / TIME: 202109271149
Unit Type and Rh: 6200

## 2019-12-27 LAB — PROTEIN ELECTROPHORESIS, SERUM
A/G Ratio: 1.6 (ref 0.7–1.7)
Albumin ELP: 3.4 g/dL (ref 2.9–4.4)
Alpha-1-Globulin: 0.2 g/dL (ref 0.0–0.4)
Alpha-2-Globulin: 0.8 g/dL (ref 0.4–1.0)
Beta Globulin: 0.6 g/dL — ABNORMAL LOW (ref 0.7–1.3)
Gamma Globulin: 0.4 g/dL (ref 0.4–1.8)
Globulin, Total: 2.1 g/dL — ABNORMAL LOW (ref 2.2–3.9)
M-Spike, %: 0.2 g/dL — ABNORMAL HIGH
Total Protein ELP: 5.5 g/dL — ABNORMAL LOW (ref 6.0–8.5)

## 2019-12-27 LAB — KAPPA/LAMBDA LIGHT CHAINS
Kappa, lambda light chain ratio: 1394.66 — ABNORMAL HIGH (ref 0.26–1.65)
Lambda free light chains: 8.8 mg/L (ref 5.7–26.3)

## 2019-12-29 ENCOUNTER — Other Ambulatory Visit (HOSPITAL_COMMUNITY): Payer: Self-pay | Admitting: *Deleted

## 2019-12-29 DIAGNOSIS — C9000 Multiple myeloma not having achieved remission: Secondary | ICD-10-CM

## 2019-12-29 MED ORDER — POMALIDOMIDE 3 MG PO CAPS: 3.0000 mg | ORAL_CAPSULE | Freq: Every day | ORAL | 0 refills | Status: DC

## 2020-01-02 ENCOUNTER — Inpatient Hospital Stay (HOSPITAL_BASED_OUTPATIENT_CLINIC_OR_DEPARTMENT_OTHER): Payer: Medicare Other | Admitting: Hematology

## 2020-01-02 ENCOUNTER — Inpatient Hospital Stay (HOSPITAL_COMMUNITY): Payer: Medicare Other

## 2020-01-02 ENCOUNTER — Inpatient Hospital Stay (HOSPITAL_COMMUNITY): Payer: Medicare Other | Attending: Hematology

## 2020-01-02 VITALS — BP 114/43 | HR 88 | Temp 96.9°F | Resp 17 | Wt 147.2 lb

## 2020-01-02 DIAGNOSIS — F17210 Nicotine dependence, cigarettes, uncomplicated: Secondary | ICD-10-CM | POA: Insufficient documentation

## 2020-01-02 DIAGNOSIS — Z79899 Other long term (current) drug therapy: Secondary | ICD-10-CM | POA: Diagnosis not present

## 2020-01-02 DIAGNOSIS — C9000 Multiple myeloma not having achieved remission: Secondary | ICD-10-CM

## 2020-01-02 DIAGNOSIS — I499 Cardiac arrhythmia, unspecified: Secondary | ICD-10-CM | POA: Diagnosis not present

## 2020-01-02 DIAGNOSIS — G893 Neoplasm related pain (acute) (chronic): Secondary | ICD-10-CM | POA: Diagnosis not present

## 2020-01-02 DIAGNOSIS — D61818 Other pancytopenia: Secondary | ICD-10-CM | POA: Diagnosis not present

## 2020-01-02 DIAGNOSIS — R944 Abnormal results of kidney function studies: Secondary | ICD-10-CM | POA: Insufficient documentation

## 2020-01-02 DIAGNOSIS — M7989 Other specified soft tissue disorders: Secondary | ICD-10-CM | POA: Diagnosis not present

## 2020-01-02 DIAGNOSIS — Z452 Encounter for adjustment and management of vascular access device: Secondary | ICD-10-CM | POA: Diagnosis not present

## 2020-01-02 DIAGNOSIS — D649 Anemia, unspecified: Secondary | ICD-10-CM | POA: Insufficient documentation

## 2020-01-02 DIAGNOSIS — H539 Unspecified visual disturbance: Secondary | ICD-10-CM | POA: Diagnosis not present

## 2020-01-02 LAB — CBC WITH DIFFERENTIAL/PLATELET
Band Neutrophils: 7 %
Basophils Absolute: 0 10*3/uL (ref 0.0–0.1)
Basophils Relative: 1 %
Blasts: 4 %
Eosinophils Absolute: 0 10*3/uL (ref 0.0–0.5)
Eosinophils Relative: 1 %
HCT: 18.2 % — ABNORMAL LOW (ref 39.0–52.0)
Hemoglobin: 6.2 g/dL — CL (ref 13.0–17.0)
Lymphocytes Relative: 40 %
Lymphs Abs: 0.6 10*3/uL — ABNORMAL LOW (ref 0.7–4.0)
MCH: 37.3 pg — ABNORMAL HIGH (ref 26.0–34.0)
MCHC: 34.1 g/dL (ref 30.0–36.0)
MCV: 109.6 fL — ABNORMAL HIGH (ref 80.0–100.0)
Monocytes Absolute: 0.1 10*3/uL (ref 0.1–1.0)
Monocytes Relative: 7 %
Myelocytes: 1 %
Neutro Abs: 0.7 10*3/uL — ABNORMAL LOW (ref 1.7–7.7)
Neutrophils Relative %: 39 %
Platelets: 23 10*3/uL — CL (ref 150–400)
RBC: 1.66 MIL/uL — ABNORMAL LOW (ref 4.22–5.81)
WBC: 1.5 10*3/uL — ABNORMAL LOW (ref 4.0–10.5)
nRBC: 0 % (ref 0.0–0.2)

## 2020-01-02 LAB — VITAMIN B12: Vitamin B-12: 1740 pg/mL — ABNORMAL HIGH (ref 180–914)

## 2020-01-02 LAB — RETICULOCYTES
Immature Retic Fract: 12.8 % (ref 2.3–15.9)
RBC.: 1.69 MIL/uL — ABNORMAL LOW (ref 4.22–5.81)
Retic Ct Pct: <0.4 % — ABNORMAL LOW (ref 0.4–3.1)

## 2020-01-02 LAB — COMPREHENSIVE METABOLIC PANEL
ALT: 26 U/L (ref 0–44)
AST: 45 U/L — ABNORMAL HIGH (ref 15–41)
Albumin: 2.8 g/dL — ABNORMAL LOW (ref 3.5–5.0)
Alkaline Phosphatase: 125 U/L (ref 38–126)
Anion gap: 15 (ref 5–15)
BUN: 81 mg/dL — ABNORMAL HIGH (ref 8–23)
CO2: 18 mmol/L — ABNORMAL LOW (ref 22–32)
Calcium: 8.2 mg/dL — ABNORMAL LOW (ref 8.9–10.3)
Chloride: 99 mmol/L (ref 98–111)
Creatinine, Ser: 6.22 mg/dL — ABNORMAL HIGH (ref 0.61–1.24)
GFR calc Af Amer: 10 mL/min — ABNORMAL LOW (ref >60)
GFR calc non Af Amer: 9 mL/min — ABNORMAL LOW (ref >60)
Glucose, Bld: 92 mg/dL (ref 70–99)
Potassium: 4.7 mmol/L (ref 3.5–5.1)
Sodium: 132 mmol/L — ABNORMAL LOW (ref 135–145)
Total Bilirubin: 0.8 mg/dL (ref 0.3–1.2)
Total Protein: 5.3 g/dL — ABNORMAL LOW (ref 6.5–8.1)

## 2020-01-02 LAB — LACTATE DEHYDROGENASE: LDH: 548 U/L — ABNORMAL HIGH (ref 98–192)

## 2020-01-02 LAB — PREPARE RBC (CROSSMATCH)

## 2020-01-02 LAB — DIRECT ANTIGLOBULIN TEST (NOT AT ARMC)
DAT, IgG: NEGATIVE
DAT, complement: NEGATIVE

## 2020-01-02 LAB — FOLATE: Folate: 9.9 ng/mL (ref >5.9)

## 2020-01-02 MED ORDER — SODIUM CHLORIDE 0.9% FLUSH
10.0000 mL | INTRAVENOUS | Status: AC | PRN
  Administered 2020-01-02: 10 mL

## 2020-01-02 MED ORDER — DIPHENHYDRAMINE HCL 25 MG PO CAPS
25.0000 mg | ORAL_CAPSULE | Freq: Once | ORAL | Status: AC
  Administered 2020-01-02: 25 mg via ORAL
  Filled 2020-01-02: qty 1

## 2020-01-02 MED ORDER — SODIUM CHLORIDE 0.9% IV SOLUTION: 250.0000 mL | Freq: Once | INTRAVENOUS | Status: DC

## 2020-01-02 MED ORDER — ACETAMINOPHEN 325 MG PO TABS
650.0000 mg | ORAL_TABLET | Freq: Once | ORAL | Status: AC
  Administered 2020-01-02: 650 mg via ORAL
  Filled 2020-01-02: qty 2

## 2020-01-02 MED ORDER — HEPARIN SOD (PORK) LOCK FLUSH 100 UNIT/ML IV SOLN
500.0000 [IU] | Freq: Every day | INTRAVENOUS | Status: AC | PRN
  Administered 2020-01-02: 500 [IU]

## 2020-01-02 NOTE — Progress Notes (Signed)
[place], [place] [address]   CLINIC:  Medical Oncology/Hematology  PCP:  Rosita Fire, MD [address] / [place] [address] [phone]   REASON FOR VISIT:  Follow-up for kappa light chain myeloma  PRIOR THERAPY:  1. CyBorD from 06/17/2018 to 04/04/2019. 2. Carfilzomib x 7 cycles from 04/21/2019 to 11/01/2019.  NGS Results: Not done  CURRENT THERAPY: Darzalex Faspro weekly; Pomalyst 3 weeks on, 1 week off  BRIEF ONCOLOGIC HISTORY:  Oncology History  Kappa light chain myeloma ([place])  06/16/2018 Initial Diagnosis   Kappa light chain myeloma ([place])   06/17/2018 - 04/04/2019 Chemotherapy   The patient had dexamethasone (DECADRON) tablet 20 mg, 20 mg (100 % of original dose 20 mg), Oral,  Once, 15 of 16 cycles Dose modification: 20 mg (original dose 20 mg, Cycle 27) Administration: 20 mg (12/27/2018), 20 mg (01/03/2019), 20 mg (01/10/2019), 20 mg (01/17/2019), 20 mg (01/24/2019), 20 mg (01/31/2019), 20 mg (02/07/2019), 20 mg (02/14/2019), 20 mg (02/21/2019), 20 mg (02/28/2019), 20 mg (03/14/2019), 20 mg (03/21/2019), 20 mg (03/29/2019), 20 mg (04/04/2019) palonosetron (ALOXI) injection 0.25 mg, 0.25 mg, Intravenous,  Once, 27 of 27 cycles Administration: 0.25 mg (06/17/2018), 0.25 mg (06/23/2018), 0.25 mg (06/30/2018), 0.25 mg (07/07/2018), 0.25 mg (07/14/2018), 0.25 mg (07/22/2018), 0.25 mg (07/29/2018), 0.25 mg (08/06/2018), 0.25 mg (08/13/2018), 0.25 mg (08/20/2018), 0.25 mg (08/27/2018), 0.25 mg (09/06/2018), 0.25 mg (09/13/2018), 0.25 mg (09/20/2018), 0.25 mg (09/27/2018), 0.25 mg (10/04/2018), 0.25 mg (10/11/2018), 0.25 mg (10/18/2018), 0.25 mg (10/25/2018), 0.25 mg (11/01/2018), 0.25 mg (11/08/2018), 0.25 mg (11/15/2018), 0.25 mg (11/22/2018), 0.25 mg (11/29/2018), 0.25 mg (12/08/2018), 0.25 mg (12/16/2018) bortezomib SQ (VELCADE) chemo injection 2.5 mg, 1.3 mg/m2 = 2.5 mg, Subcutaneous,  Once, 41 of 42 cycles Administration: 2.5 mg (06/17/2018), 2.5 mg (06/23/2018),  2.5 mg (06/30/2018), 2.5 mg (07/07/2018), 2.5 mg (07/14/2018), 2.5 mg (07/22/2018), 2.5 mg (07/29/2018), 2.5 mg (08/06/2018), 2.5 mg (08/13/2018), 2.5 mg (08/20/2018), 2.5 mg (08/27/2018), 2.5 mg (09/06/2018), 2.5 mg (09/13/2018), 2.5 mg (09/20/2018), 2.5 mg (09/27/2018), 2.5 mg (10/04/2018), 2.5 mg (10/11/2018), 2.5 mg (10/18/2018), 2.5 mg (10/25/2018), 2.5 mg (11/01/2018), 2.5 mg (11/08/2018), 2.5 mg (11/15/2018), 2.5 mg (11/22/2018), 2.5 mg (11/29/2018), 2.5 mg (12/08/2018), 2.5 mg (12/16/2018), 2.5 mg (12/27/2018), 2.5 mg (01/03/2019), 2.5 mg (01/10/2019), 2.5 mg (01/17/2019), 2.5 mg (01/24/2019), 2.5 mg (01/31/2019), 2.5 mg (02/07/2019), 2.5 mg (02/14/2019), 2.5 mg (02/21/2019), 2.5 mg (02/28/2019), 2.5 mg (03/07/2019), 2.5 mg (03/14/2019), 2.5 mg (03/21/2019), 2.5 mg (03/29/2019), 2.5 mg (04/04/2019) cyclophosphamide (CYTOXAN) 300 mg in sodium chloride 0.9 % 250 mL chemo infusion, 300 mg (100 % of original dose 300 mg), Intravenous,  Once, 26 of 26 cycles Dose modification: 300 mg (original dose 300 mg, Cycle 1, Reason: Change in SCr/CrCl), 500 mg (original dose 500 mg, Cycle 2, Reason: Provider Judgment), 500 mg (original dose 500 mg, Cycle 3, Reason: Change in SCr/CrCl) Administration: 300 mg (06/17/2018), 500 mg (06/23/2018), 500 mg (06/30/2018), 600 mg (07/07/2018), 600 mg (07/14/2018), 600 mg (07/22/2018), 600 mg (07/29/2018), 600 mg (08/06/2018), 600 mg (08/13/2018), 600 mg (08/20/2018), 600 mg (08/27/2018), 600 mg (09/06/2018), 600 mg (09/13/2018), 600 mg (09/20/2018), 600 mg (09/27/2018), 600 mg (10/04/2018), 600 mg (10/11/2018), 600 mg (10/18/2018), 600 mg (10/25/2018), 600 mg (11/01/2018), 600 mg (11/08/2018), 600 mg (11/15/2018), 600 mg (11/22/2018), 600 mg (11/29/2018), 600 mg (12/08/2018), 600 mg (12/16/2018)  for chemotherapy treatment.    04/21/2019 - 11/01/2019 Chemotherapy   The patient had ondansetron (ZOFRAN) injection 4 mg, 4 mg (100 % of original  dose 4 mg), Intravenous,  Once, 6 of 7 cycles Dose modification: 8 mg (original dose 4 mg, Cycle 2), 4 mg  (original dose 4 mg, Cycle 2), 4 mg (original dose 4 mg, Cycle 2) Administration: 4 mg (06/09/2019), 4 mg (06/10/2019), 4 mg (06/23/2019), 4 mg (06/24/2019), 4 mg (07/04/2019), 4 mg (07/05/2019), 4 mg (07/11/2019), 4 mg (07/12/2019), 4 mg (08/22/2019), 4 mg (08/23/2019), 4 mg (08/30/2019), 4 mg (08/31/2019), 4 mg (09/06/2019), 4 mg (09/07/2019), 4 mg (07/25/2019), 4 mg (07/26/2019), 4 mg (08/01/2019), 4 mg (08/02/2019), 4 mg (09/19/2019), 4 mg (09/20/2019), 4 mg (09/26/2019), 4 mg (09/27/2019), 4 mg (10/04/2019), 4 mg (10/05/2019), 4 mg (10/17/2019), 4 mg (10/18/2019), 4 mg (10/24/2019), 4 mg (10/25/2019), 4 mg (10/31/2019), 4 mg (11/01/2019) carfilzomib (KYPROLIS) 40 mg in dextrose 5 % 50 mL chemo infusion, 20 mg/m2 = 40 mg, Intravenous,  Once, 7 of 8 cycles Dose modification: 27 mg/m2 (original dose 27 mg/m2, Cycle 1, Reason: Provider Judgment) Administration: 40 mg (04/21/2019), 40 mg (04/22/2019), 60 mg (04/28/2019), 60 mg (04/29/2019), 60 mg (05/05/2019), 60 mg (05/06/2019), 56 mg (05/26/2019), 56 mg (05/27/2019), 56 mg (06/02/2019), 56 mg (06/03/2019), 56 mg (06/09/2019), 56 mg (06/10/2019), 56 mg (08/22/2019), 56 mg (08/23/2019), 56 mg (08/30/2019), 56 mg (08/31/2019), 56 mg (09/06/2019), 56 mg (09/07/2019), 56 mg (06/23/2019), 56 mg (06/24/2019), 56 mg (07/04/2019), 56 mg (07/05/2019), 56 mg (07/11/2019), 56 mg (07/12/2019), 56 mg (07/25/2019), 56 mg (07/26/2019), 56 mg (08/01/2019), 56 mg (08/02/2019), 56 mg (09/19/2019), 56 mg (09/20/2019), 56 mg (09/26/2019), 56 mg (09/27/2019), 56 mg (10/04/2019), 56 mg (10/05/2019), 56 mg (10/17/2019), 56 mg (10/18/2019), 56 mg (10/24/2019), 56 mg (10/25/2019), 56 mg (10/31/2019), 56 mg (11/01/2019)  for chemotherapy treatment.    11/21/2019 -  Chemotherapy   The patient had dexamethasone (DECADRON) 4 MG tablet, 40 mg (100 % of original dose 40 mg), Oral, Every 7 days, 3 of 11 cycles Dose modification: 40 mg (original dose 40 mg, Cycle 0) Administration: 20 mg (11/21/2019), 20 mg (11/28/2019), 20 mg (12/06/2019), 20 mg (12/12/2019), 20 mg  (12/19/2019) daratumumab-hyaluronidase-fihj (DARZALEX FASPRO) 1800-30000 MG-UT/15ML chemo SQ injection 1,800 mg, 1,800 mg, Subcutaneous,  Once, 2 of 10 cycles Administration: 1,800 mg (11/21/2019), 1,800 mg (11/28/2019), 1,800 mg (12/06/2019), 1,800 mg (12/12/2019), 1,800 mg (12/19/2019)  for chemotherapy treatment.      CANCER STAGING: Cancer Staging No matching staging information was found for the patient.  INTERVAL HISTORY:  [name], a 59 y.o. male, returns for routine follow-up and consideration for next cycle of chemotherapy. [name] was last seen on 12/26/2019.  Due for day #8 of cycle #2 of Darzalex Faspro today.   Overall, he tells me he has been feeling good. He stopped taking Pomalyst last week. He is still producing urine. He reports having arthritic pain over the weekend when it was cold, similar to last weekend. Both of his feet are swollen and it is difficult to put on his shoes. He has been taking Demadex BID for 1 week for his swollen feet. He is trying to control his sodium intake. He reports having intermittent hematochezia.   REVIEW OF SYSTEMS:  Review of Systems  Constitutional: Positive for appetite change (25%) and fatigue (depleted).  Respiratory: Positive for cough and shortness of breath.   Cardiovascular: Positive for leg swelling (bilat) and palpitations.  Gastrointestinal: Positive for blood in stool (intermittent) and diarrhea.  Genitourinary: Positive for difficulty urinating.   Musculoskeletal: Positive for arthralgias (arthritic knees pain) and back pain (8/10 lower back pain).  All other  systems reviewed and are negative.   PAST MEDICAL/SURGICAL HISTORY:  Past Medical History:  Diagnosis Date  . Allergy   . Arthritis    neck and back  . Blood transfusion without reported diagnosis   . Bone metastases ([place]) 09/19/2019  . BPH (benign prostatic hyperplasia)   . Cancer [facility]) 2004   testicle  . Chronic kidney disease    kidney stones  . Left  lumbar radiculopathy 06/12/2016  . 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 Medical history non-contributory    Pt has scattered thoughts and uncertain of past medical history  . 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 Stones, urinary tract   . Substance abuse ([place])    prescribed oxydcodone- 10 years   Past Surgical History:  Procedure Laterality Date  . BACK SURGERY     x5  . CERVICAL DISC SURGERY     x2  . COLONOSCOPY WITH PROPOFOL N/A 08/11/2016   Dr. Gala Romney: non-bleeding internal hemorrhoids, one 4 mm hyperplastic rectal polyp, diverticulosis in entire examined colon  . POLYPECTOMY  08/11/2016   Procedure: POLYPECTOMY;  Surgeon: [name], MD;  Location: [facility] ENDO SUITE;  Service: Endoscopy;;  colon  . PORTACATH PLACEMENT Left 09/20/2018   Procedure: INSERTION PORT-A-CATH (catheter attached left subclavian);  Surgeon: Virl Cagey, MD;  Location: [facility] ORS;  Service: General;  Laterality: Left;  . testicular cancer  2004  . TONSILLECTOMY      SOCIAL HISTORY:  Social History   Socioeconomic History  . Marital status: Single    Spouse name: Not on file  . Number of children: 1  . Years of education: 76  . Highest education level: Not on file  Occupational History  . Occupation: retired    Comment: Psychologist, prison and probation services  . Occupation: disabled  Tobacco Use  . Smoking status: Current Every Day Smoker    Packs/day: 1.00    Years: 40.00    Pack years: 40.00    Types: Cigarettes    Start date: 03/31/1974  . Smokeless tobacco: Never Used  Vaping Use  . Vaping Use: Never used  Substance and Sexual Activity  . Alcohol use: Yes    Comment: Occasional  . Drug use: No    Types: Cocaine    Comment: Remote hx of cocaine, quit 1989  . Sexual activity: Yes  Other Topics Concern  . Not on file  Social History Narrative   [employer] for 12 years   [employer] for 57 years      Never married   One daughter      Lives alone   Retail banker   Right-handed   Occasional  caffeine use         Social Determinants of Health   Financial Resource Strain:   . Difficulty of Paying Living Expenses: Not on file  Food Insecurity:   . Worried About Charity fundraiser in the Last Year: Not on file  . Ran Out of Food in the Last Year: Not on file  Transportation Needs:   . Lack of Transportation (Medical): Not on file  . Lack of Transportation (Non-Medical): Not on file  Physical Activity:   . Days of Exercise per Week: Not on file  . Minutes of Exercise per Session: Not on file  Stress:   . Feeling of Stress : Not on file  Social Connections:   . Frequency of Communication with Friends and Family: Not on file  . Frequency of Social Gatherings with  Friends and Family: Not on file  . Attends Religious Services: Not on file  . Active Member of Clubs or Organizations: Not on file  . Attends Archivist Meetings: Not on file  . Marital Status: Not on file  Intimate Partner Violence:   . Fear of Current or Ex-Partner: Not on file  . Emotionally Abused: Not on file  . Physically Abused: Not on file  . Sexually Abused: Not on file    FAMILY HISTORY:  Family History  Problem Relation Age of Onset  . COPD Mother   . Heart disease Mother   . Other Father        Never knew his father.  . Thyroid disease Sister   . Arthritis Sister   . Heart disease Sister        bypass  . Colon cancer Neg Hx     CURRENT MEDICATIONS:  Current Outpatient Medications  Medication Sig Dispense Refill  . aspirin EC 81 MG tablet Take 81 mg by mouth daily.    . calcitRIOL (ROCALTROL) 0.5 MCG capsule Take 0.5 mcg by mouth daily.     Marland Kitchen dexamethasone (DECADRON) 4 MG tablet Take 10 tablets (40 mg total) by mouth every 7 (seven) days. Take for 2 days starting the night of chemotherapy. 40 tablet 6  . Epoetin Alfa (PROCRIT IJ) Inject as directed. Unsure of dosage- gets this once weekly    . HYDROmorphone (DILAUDID) 4 MG tablet Take 1 tablet (4 mg total) by mouth every 3  (three) hours as needed for severe pain. 240 tablet 0  . Patiromer Sorbitex Calcium (VELTASSA PO) Take by mouth 2 (two) times a week.     . pomalidomide (POMALYST) 3 MG capsule Take 1 capsule (3 mg total) by mouth daily. 21 capsule 0  . senna (SENOKOT) 8.6 MG tablet Take 1 tablet by mouth 2 (two) times daily.     . sodium bicarbonate 650 MG tablet Take 1,300 mg by mouth 2 (two) times daily.    Marland Kitchen torsemide (DEMADEX) 20 MG tablet TAKING 1 TABLET DAILY, AND 2 TABLETS ONLY IF HIS ANKLES ARE REALLY SWOLLEN 180 tablet 1  . valACYclovir (VALTREX) 500 MG tablet TAKE 1 TABLET BY MOUTH TWICE A DAY 180 tablet 2  . prochlorperazine (COMPAZINE) 5 MG tablet Take 1 tablet (5 mg total) by mouth every 6 (six) hours as needed for nausea or vomiting. (Patient not taking: Reported on 01/02/2020) 30 tablet 2   No current facility-administered medications for this visit.   Facility-Administered Medications Ordered in Other Visits  Medication Dose Route Frequency Provider Last Rate Last Admin  . 0.9 %  sodium chloride infusion (Manually program via Guardrails IV Fluids)  250 mL Intravenous Once Derek Jack, MD      . heparin lock flush 100 unit/mL  500 Units Intracatheter Daily PRN Derek Jack, MD      . sodium chloride flush (NS) 0.9 % injection 10 mL  10 mL Intracatheter PRN Derek Jack, MD        ALLERGIES:  Allergies  Allergen Reactions  . Oxycodone-Acetaminophen   . Acetaminophen Nausea Only and Other (See Comments)    Elevated liver enzymes  . Cymbalta [Duloxetine Hcl] Swelling    Facial swelling  . Diclofenac Sodium Swelling       . Diclofenac Sodium Swelling  . Imodium [Loperamide] Swelling    facial  . Lyrica [Pregabalin] Swelling  . Morphine Other (See Comments)    Bradycardia   . Vioxx [Rofecoxib]  Swelling  . Aleve [Naproxen] Swelling    eye    PHYSICAL EXAM:  Performance status (ECOG): 1 - Symptomatic but completely ambulatory  Vitals:   01/02/20 1026  BP:  (!) 114/43  Pulse: 88  Resp: 17  Temp: (!) 96.9 F (36.1 C)  SpO2: 100%   Wt Readings from Last 3 Encounters:  01/02/20 147 lb 3.2 oz (66.8 kg)  12/26/19 148 lb (67.1 kg)  12/19/19 150 lb 12.8 oz (68.4 kg)   Physical Exam Vitals reviewed.  Constitutional:      Appearance: Normal appearance.  Cardiovascular:     Rate and Rhythm: Normal rate and regular rhythm.     Pulses: Normal pulses.     Heart sounds: Normal heart sounds.  Pulmonary:     Effort: Pulmonary effort is normal.     Breath sounds: Normal breath sounds.  Chest:     Comments: Port-a-Cath in L chest Musculoskeletal:     Right lower leg: Edema (2+) present.     Left lower leg: Edema (2+) present.  Neurological:     General: No focal deficit present.     Mental Status: He is alert and oriented to person, place, and time.  Psychiatric:        Mood and Affect: Mood normal.        Behavior: Behavior normal.     LABORATORY DATA:  I have reviewed the labs as listed.  CBC Latest Ref Rng & Units 01/02/2020 12/26/2019 12/19/2019  WBC 4.0 - 10.5 K/uL 1.5(L) 0.7(LL) 2.3(L)  Hemoglobin 13.0 - 17.0 g/dL 6.2(LL) 7.1(L) 6.8(LL)  Hematocrit 39 - 52 % 18.2(L) 21.2(L) 20.7(L)  Platelets 150 - 400 K/uL 23(LL) 14(LL) 40(L)   CMP Latest Ref Rng & Units 01/02/2020 12/26/2019 12/19/2019  Glucose 70 - 99 mg/dL 92 90 101(H)  BUN 8 - 23 mg/dL 81(H) 76(H) 64(H)  Creatinine 0.61 - 1.24 mg/dL 6.22(H) 6.43(H) 6.38(H)  Sodium 135 - 145 mmol/L 132(L) 132(L) 137  Potassium 3.5 - 5.1 mmol/L 4.7 4.9 5.0  Chloride 98 - 111 mmol/L 99 99 105  CO2 22 - 32 mmol/L 18(L) 20(L) 21(L)  Calcium 8.9 - 10.3 mg/dL 8.2(L) 8.7(L) 9.8  Total Protein 6.5 - 8.1 g/dL 5.3(L) 5.8(L) 5.8(L)  Total Bilirubin 0.3 - 1.2 mg/dL 0.8 1.4(H) 0.8  Alkaline Phos 38 - 126 U/L 125 98 79  AST 15 - 41 U/L 45(H) 59(H) 20  ALT 0 - 44 U/L $Remo'26 18 15   'UkJSB$ Lab Results  Component Value Date   TOTALPROTELP 5.5 (L) 12/26/2019   ALBUMINELP 3.4 12/26/2019   A1GS 0.2 12/26/2019   A2GS  0.8 12/26/2019   BETS 0.6 (L) 12/26/2019   GAMS 0.4 12/26/2019   MSPIKE 0.2 (H) 12/26/2019   SPEI Comment 12/26/2019    Lab Results  Component Value Date   KPAFRELGTCHN Note: (A) 12/26/2019   LAMBDASER 8.8 12/26/2019   KAPLAMBRATIO 1,394.66 (H) 12/26/2019   Lab Results  Component Value Date   LDH 548 (H) 01/02/2020   LDH 1,272 (H) 12/26/2019   LDH 581 (H) 12/12/2019    DIAGNOSTIC IMAGING:  I have independently reviewed the scans and discussed with the patient. No results found.   ASSESSMENT:  1. Kappa light chain myeloma, high risk, stage III: -CyBorD from 06/17/2018 through 04/04/2019 with progression. -He was evaluated for bone marrow [facility] at [facility] but has refused it. -Carfilzomib, pomalidomide and dexamethasone from 04/21/2019. -Myeloma panel from 08/08/2019 shows M spike 0.1 g. Previously this was not  detectable. Kappa light chains are 561, lambda light chains 36.9 and ratio 15.2. Ratio was previously 11.2 and prior to that 16.1. -We had to held his carfilzomib day 15 and 16 of his last cycle because of elevated creatinine. -Daratumumab pomalidomide dexamethasone started on 11/21/2019.  2. Back pain: -MRI of the lumbar and thoracic spine on 04/20/2019 showed ventral epidural tumor posterior to the T12 vertebral body with mild spinal stenosis. -XRT to the back from 05/22/2019 through 05/27/2019. -MRI of the thoracic and lumbar spine on 10/06/2019 shows T7, T10, T11 and T12 lesions are slightly better seen on the study as the prior study was degraded by motion artifact. No new findings. Overall grossly stable appearing myelomatous changes at T12, L2 and L3 with no progressive findings. Right seventh and ninth posterior rib lesions are probably stable. Right seventh posterior rib lesion may be larger or just better seen. There does appear to be a new adjacent soft tissue component.   PLAN:  1. Kappa light chain myeloma: -He developed severe  pancytopenia as he was taking pomalidomide without break. -We held his pomalidomide and Darzalex since last week. -CBC today shows white count is 1.5 with ANC of 700.  Platelet count is 23.  Hemoglobin is 6.2. -Direct [place] test was negative.  Reticulocyte count is low. -LDH has come down to 548. -Myeloma panel showed light chain ratio went up to 3094.66.  Kappa light chains went up to 12,273. -We have discussed the need for bone marrow biopsy if there is no improvement in pancytopenia. -His ankles are more swollen since the last few days.  He is taking diuretic.  2. Back pain: -Chronic back pain and right lateral rib pain is stable.  Continue Dilaudid 4 mg every 3 hours.  3. Anemia: -Hemoglobin today 6.2.  Will transfuse 1 unit.  Continue Retacrit.  4. Myeloma bone disease: -Continue monthly denosumab.  5. ID/thromboprophylaxis: -Continue Valtrex and aspirin.  6. Irregular heart rate: -Cardiology referral was made because of prolonged QTC and frequent PVCs.   Orders placed this encounter:  Orders Placed This Encounter  Procedures  . Lactate dehydrogenase  . Reticulocytes  . Lactate dehydrogenase  . Kappa/lambda light chains  . Protein electrophoresis, serum  . Folate  . Vitamin B12  . Direct antiglobulin test     Derek Jack, MD [place] [phone]   I, Milinda Antis, am acting as a scribe for Dr. Sanda Linger.  I, Derek Jack MD, have reviewed the above documentation for accuracy and completeness, and I agree with the above.

## 2020-01-02 NOTE — Patient Instructions (Addendum)
[facility] Discharge Instructions  You were seen today by Dr. Katragadda. He went over your recent results. You did not receive your treatment today. Dr. Katragadda will see you back in 1 week for labs and follow up.   Thank you for choosing [facility] at [facility] to provide your oncology and hematology care.  To afford each patient quality time with our provider, please arrive at least 15 minutes before your scheduled appointment time.   If you have a lab appointment with the Cancer Center please come in thru the Main Entrance and check in at the main information desk  You need to re-schedule your appointment should you arrive 10 or more minutes late.  We strive to give you quality time with our providers, and arriving late affects you and other patients whose appointments are after yours.  Also, if you no show three or more times for appointments you may be dismissed from the clinic at the providers discretion.     Again, thank you for choosing [facility].  Our hope is that these requests will decrease the amount of time that you wait before being seen by our physicians.       _____________________________________________________________  Should you have questions after your visit to [facility], please contact our office at [phone] between the hours of 8:00 a.m. and 4:30 p.m.  Voicemails left after 4:00 p.m. will not be returned until the following business day.  For prescription refill requests, have your pharmacy contact our office and allow 72 hours.    Cancer Center Support Programs:   > Cancer Support Group  2nd Tuesday of the month 1pm-2pm, Journey Room    

## 2020-01-02 NOTE — Progress Notes (Signed)
No treatment today. Additional labs drawn per orders. Will set patient up for one unit of blood tomorrow per MD. Patient has antibodies. Will give Reticrit tomorrow ,ok with MD.    Treatment given per orders. Patient tolerated it well without problems. Vitals stable and discharged home from clinic ambulatory in stable conditions. Follow up as scheduled.

## 2020-01-02 NOTE — Progress Notes (Signed)
Critical value alert  hgb 6.2  Dr. Delton Coombes and primary RN aware.  Dr. Delton Coombes will assess patient.

## 2020-01-03 ENCOUNTER — Encounter (HOSPITAL_COMMUNITY): Payer: Medicare Other

## 2020-01-03 ENCOUNTER — Inpatient Hospital Stay (HOSPITAL_COMMUNITY): Payer: Medicare Other

## 2020-01-03 ENCOUNTER — Other Ambulatory Visit: Payer: Self-pay

## 2020-01-03 DIAGNOSIS — C9000 Multiple myeloma not having achieved remission: Secondary | ICD-10-CM | POA: Diagnosis not present

## 2020-01-04 ENCOUNTER — Inpatient Hospital Stay (HOSPITAL_COMMUNITY): Payer: Medicare Other

## 2020-01-04 VITALS — BP 128/47 | HR 52 | Temp 97.2°F | Resp 17 | Wt 147.0 lb

## 2020-01-04 DIAGNOSIS — D649 Anemia, unspecified: Secondary | ICD-10-CM

## 2020-01-04 DIAGNOSIS — C9000 Multiple myeloma not having achieved remission: Secondary | ICD-10-CM | POA: Diagnosis not present

## 2020-01-04 MED ORDER — SODIUM CHLORIDE 0.9% FLUSH: 10.0000 mL | INTRAVENOUS | Status: DC | PRN

## 2020-01-04 MED ORDER — ACETAMINOPHEN 325 MG PO TABS
650.0000 mg | ORAL_TABLET | Freq: Once | ORAL | Status: AC
  Administered 2020-01-04: 650 mg via ORAL

## 2020-01-04 MED ORDER — HEPARIN SOD (PORK) LOCK FLUSH 100 UNIT/ML IV SOLN
500.0000 [IU] | Freq: Every day | INTRAVENOUS | Status: AC | PRN
  Administered 2020-01-04: 500 [IU]

## 2020-01-04 MED ORDER — SODIUM CHLORIDE 0.9% IV SOLUTION
250.0000 mL | Freq: Once | INTRAVENOUS | Status: AC
  Administered 2020-01-04: 250 mL via INTRAVENOUS

## 2020-01-04 MED ORDER — DIPHENHYDRAMINE HCL 25 MG PO CAPS
25.0000 mg | ORAL_CAPSULE | Freq: Once | ORAL | Status: AC
  Administered 2020-01-04: 25 mg via ORAL

## 2020-01-04 NOTE — Progress Notes (Signed)
Tolerated transfusion w/o adverse reaction.  Alert, in no distress.  VSS.  Discharged ambulatory in stable condition.  

## 2020-01-06 LAB — BPAM RBC
Blood Product Expiration Date: 202111032359
Blood Product Expiration Date: 202111032359
ISSUE DATE / TIME: 202110061135
ISSUE DATE / TIME: 202110061135
Unit Type and Rh: 6200
Unit Type and Rh: 6200

## 2020-01-06 LAB — TYPE AND SCREEN
ABO/RH(D): A POS
Antibody Screen: POSITIVE
Unit division: 0
Unit division: 0

## 2020-01-09 ENCOUNTER — Inpatient Hospital Stay (HOSPITAL_COMMUNITY): Payer: Medicare Other

## 2020-01-09 ENCOUNTER — Inpatient Hospital Stay (HOSPITAL_BASED_OUTPATIENT_CLINIC_OR_DEPARTMENT_OTHER): Payer: Medicare Other | Admitting: Hematology

## 2020-01-09 ENCOUNTER — Ambulatory Visit (HOSPITAL_COMMUNITY): Payer: Medicare Other

## 2020-01-09 ENCOUNTER — Other Ambulatory Visit: Payer: Self-pay

## 2020-01-09 ENCOUNTER — Ambulatory Visit (HOSPITAL_COMMUNITY): Payer: Medicare Other | Admitting: Hematology

## 2020-01-09 VITALS — BP 110/41 | HR 48 | Temp 96.9°F | Resp 18 | Wt 144.6 lb

## 2020-01-09 DIAGNOSIS — M7989 Other specified soft tissue disorders: Secondary | ICD-10-CM | POA: Diagnosis not present

## 2020-01-09 DIAGNOSIS — G893 Neoplasm related pain (acute) (chronic): Secondary | ICD-10-CM | POA: Diagnosis not present

## 2020-01-09 DIAGNOSIS — D61818 Other pancytopenia: Secondary | ICD-10-CM | POA: Diagnosis not present

## 2020-01-09 DIAGNOSIS — D649 Anemia, unspecified: Secondary | ICD-10-CM | POA: Diagnosis not present

## 2020-01-09 DIAGNOSIS — Z79899 Other long term (current) drug therapy: Secondary | ICD-10-CM | POA: Diagnosis not present

## 2020-01-09 DIAGNOSIS — Z452 Encounter for adjustment and management of vascular access device: Secondary | ICD-10-CM | POA: Diagnosis not present

## 2020-01-09 DIAGNOSIS — C9000 Multiple myeloma not having achieved remission: Secondary | ICD-10-CM

## 2020-01-09 DIAGNOSIS — F17210 Nicotine dependence, cigarettes, uncomplicated: Secondary | ICD-10-CM | POA: Diagnosis not present

## 2020-01-09 DIAGNOSIS — I499 Cardiac arrhythmia, unspecified: Secondary | ICD-10-CM | POA: Diagnosis not present

## 2020-01-09 DIAGNOSIS — H539 Unspecified visual disturbance: Secondary | ICD-10-CM | POA: Diagnosis not present

## 2020-01-09 DIAGNOSIS — R944 Abnormal results of kidney function studies: Secondary | ICD-10-CM | POA: Diagnosis not present

## 2020-01-09 DIAGNOSIS — N185 Chronic kidney disease, stage 5: Secondary | ICD-10-CM

## 2020-01-09 LAB — COMPREHENSIVE METABOLIC PANEL
ALT: 42 U/L (ref 0–44)
AST: 60 U/L — ABNORMAL HIGH (ref 15–41)
Albumin: 3 g/dL — ABNORMAL LOW (ref 3.5–5.0)
Alkaline Phosphatase: 130 U/L — ABNORMAL HIGH (ref 38–126)
Anion gap: 12 (ref 5–15)
BUN: 56 mg/dL — ABNORMAL HIGH (ref 8–23)
CO2: 23 mmol/L (ref 22–32)
Calcium: 8.6 mg/dL — ABNORMAL LOW (ref 8.9–10.3)
Chloride: 100 mmol/L (ref 98–111)
Creatinine, Ser: 5.93 mg/dL — ABNORMAL HIGH (ref 0.61–1.24)
GFR, Estimated: 9 mL/min — ABNORMAL LOW (ref >60)
Glucose, Bld: 82 mg/dL (ref 70–99)
Potassium: 4.7 mmol/L (ref 3.5–5.1)
Sodium: 135 mmol/L (ref 135–145)
Total Bilirubin: 1.1 mg/dL (ref 0.3–1.2)
Total Protein: 5.9 g/dL — ABNORMAL LOW (ref 6.5–8.1)

## 2020-01-09 LAB — CBC WITH DIFFERENTIAL/PLATELET
Band Neutrophils: 1 %
Basophils Absolute: 0 10*3/uL (ref 0.0–0.1)
Basophils Relative: 0 %
Blasts: 4 %
Eosinophils Absolute: 0 10*3/uL (ref 0.0–0.5)
Eosinophils Relative: 0 %
HCT: 20.1 % — ABNORMAL LOW (ref 39.0–52.0)
Hemoglobin: 6.5 g/dL — CL (ref 13.0–17.0)
Lymphocytes Relative: 36 %
Lymphs Abs: 0.6 10*3/uL — ABNORMAL LOW (ref 0.7–4.0)
MCH: 34 pg (ref 26.0–34.0)
MCHC: 32.3 g/dL (ref 30.0–36.0)
MCV: 105.2 fL — ABNORMAL HIGH (ref 80.0–100.0)
Metamyelocytes Relative: 4 %
Monocytes Absolute: 0 10*3/uL — ABNORMAL LOW (ref 0.1–1.0)
Monocytes Relative: 0 %
Myelocytes: 4 %
Neutro Abs: 0.9 10*3/uL — ABNORMAL LOW (ref 1.7–7.7)
Neutrophils Relative %: 49 %
Platelets: 21 10*3/uL — CL (ref 150–400)
Promyelocytes Relative: 2 %
RBC: 1.91 MIL/uL — ABNORMAL LOW (ref 4.22–5.81)
WBC: 1.8 10*3/uL — ABNORMAL LOW (ref 4.0–10.5)
nRBC: 0 % (ref 0.0–0.2)

## 2020-01-09 LAB — BPAM RBC
Blood Product Expiration Date: 202111092359
Unit Type and Rh: 9500

## 2020-01-09 LAB — LACTATE DEHYDROGENASE: LDH: 577 U/L — ABNORMAL HIGH (ref 98–192)

## 2020-01-09 LAB — PREPARE RBC (CROSSMATCH)

## 2020-01-09 MED ORDER — SODIUM CHLORIDE 0.9% IV SOLUTION: 250.0000 mL | Freq: Once | INTRAVENOUS | Status: DC

## 2020-01-09 MED ORDER — EPOETIN ALFA-EPBX 20000 UNIT/ML IJ SOLN
20000.0000 [IU] | Freq: Once | INTRAMUSCULAR | Status: AC
  Administered 2020-01-09: 20000 [IU] via SUBCUTANEOUS

## 2020-01-09 MED ORDER — EPOETIN ALFA-EPBX 10000 UNIT/ML IJ SOLN
10000.0000 [IU] | Freq: Once | INTRAMUSCULAR | Status: AC
  Administered 2020-01-09: 10000 [IU] via SUBCUTANEOUS

## 2020-01-09 MED ORDER — DIPHENHYDRAMINE HCL 25 MG PO CAPS: 25.0000 mg | ORAL_CAPSULE | Freq: Once | ORAL | Status: DC

## 2020-01-09 MED ORDER — ACETAMINOPHEN 325 MG PO TABS: 650.0000 mg | ORAL_TABLET | Freq: Once | ORAL | Status: DC

## 2020-01-09 MED ORDER — EPOETIN ALFA-EPBX 10000 UNIT/ML IJ SOLN
INTRAMUSCULAR | Status: AC
  Filled 2020-01-09: qty 1

## 2020-01-09 MED ORDER — HEPARIN SOD (PORK) LOCK FLUSH 100 UNIT/ML IV SOLN
500.0000 [IU] | Freq: Every day | INTRAVENOUS | Status: AC | PRN
  Administered 2020-01-09: 500 [IU]

## 2020-01-09 MED ORDER — EPOETIN ALFA-EPBX 20000 UNIT/ML IJ SOLN
INTRAMUSCULAR | Status: AC
  Filled 2020-01-09: qty 1

## 2020-01-09 MED ORDER — SODIUM CHLORIDE 0.9% FLUSH
10.0000 mL | INTRAVENOUS | Status: AC | PRN
  Administered 2020-01-09: 10 mL

## 2020-01-09 NOTE — Progress Notes (Signed)
Critical value alert:   Hemoglobin 6.5, platelet 21.   Dr.Katragadda aware. We are holding treatment today.  We will give 1 unit PRBC today. We will set patient up for bone marrow biopsy.   Primary RN and pharmacy aware.

## 2020-01-09 NOTE — Progress Notes (Signed)
Patient presents today for treatment and follow up visit with Dr. Delton Coombes. MAR reviewed. Vital signs stable. Hgb 6.5 . MD aware. Message received from [place] RN/ Dr. Delton Coombes to hold treatment today. Infuse one unit of PRBC's today and give Retacrit injection. Bone Marrow biopsy to be scheduled. Scheduling notified.    Vital signs stable. No complaints at this time. Discharged from clinic ambulatory in stable condition. Alert and oriented x 3. F/U with [facility] as scheduled.

## 2020-01-09 NOTE — Patient Instructions (Signed)
[facility]  Discharge Instructions:   _______________________________________________________________  Thank you for choosing [facility] at [facility] to provide your oncology and hematology care.  To afford each patient quality time with our providers, please arrive at least 15 minutes before your scheduled appointment.  You need to re-schedule your appointment if you arrive 10 or more minutes late.  We strive to give you quality time with our providers, and arriving late affects you and other patients whose appointments are after yours.  Also, if you no show three or more times for appointments you may be dismissed from the clinic.  Again, thank you for choosing  [facility] Center at [facility]. Our hope is that these requests will allow you access to exceptional care and in a timely manner. _______________________________________________________________  If you have questions after your visit, please contact our office at [phone] between the hours of 8:30 a.m. and 5:00 p.m. Voicemails left after 4:30 p.m. will not be returned until the following business day. _______________________________________________________________  For prescription refill requests, have your pharmacy contact our office. _______________________________________________________________  Recommendations made by the consultant and any test results will be sent to your referring physician. _______________________________________________________________ 

## 2020-01-09 NOTE — Progress Notes (Signed)
[place], [place] [address]   CLINIC:  Medical Oncology/Hematology  PCP:  Rosita Fire, MD [address] / [place] [address] [phone]   REASON FOR VISIT:  Follow-up for kappa light chain myeloma  PRIOR THERAPY:  1. CyBorD from 06/17/2018 to 04/04/2019. 2. Carfilzomib x 7 cycles from 04/21/2019 to 11/01/2019.  NGS Results: Not done  CURRENT THERAPY: Darzalex Faspro weekly; Pomalyst 3 weeks on, 1 week off  BRIEF ONCOLOGIC HISTORY:  Oncology History  Kappa light chain myeloma ([place])  06/16/2018 Initial Diagnosis   Kappa light chain myeloma ([place])   06/17/2018 - 04/04/2019 Chemotherapy   The patient had dexamethasone (DECADRON) tablet 20 mg, 20 mg (100 % of original dose 20 mg), Oral,  Once, 15 of 16 cycles Dose modification: 20 mg (original dose 20 mg, Cycle 27) Administration: 20 mg (12/27/2018), 20 mg (01/03/2019), 20 mg (01/10/2019), 20 mg (01/17/2019), 20 mg (01/24/2019), 20 mg (01/31/2019), 20 mg (02/07/2019), 20 mg (02/14/2019), 20 mg (02/21/2019), 20 mg (02/28/2019), 20 mg (03/14/2019), 20 mg (03/21/2019), 20 mg (03/29/2019), 20 mg (04/04/2019) palonosetron (ALOXI) injection 0.25 mg, 0.25 mg, Intravenous,  Once, 27 of 27 cycles Administration: 0.25 mg (06/17/2018), 0.25 mg (06/23/2018), 0.25 mg (06/30/2018), 0.25 mg (07/07/2018), 0.25 mg (07/14/2018), 0.25 mg (07/22/2018), 0.25 mg (07/29/2018), 0.25 mg (08/06/2018), 0.25 mg (08/13/2018), 0.25 mg (08/20/2018), 0.25 mg (08/27/2018), 0.25 mg (09/06/2018), 0.25 mg (09/13/2018), 0.25 mg (09/20/2018), 0.25 mg (09/27/2018), 0.25 mg (10/04/2018), 0.25 mg (10/11/2018), 0.25 mg (10/18/2018), 0.25 mg (10/25/2018), 0.25 mg (11/01/2018), 0.25 mg (11/08/2018), 0.25 mg (11/15/2018), 0.25 mg (11/22/2018), 0.25 mg (11/29/2018), 0.25 mg (12/08/2018), 0.25 mg (12/16/2018) bortezomib SQ (VELCADE) chemo injection 2.5 mg, 1.3 mg/m2 = 2.5 mg, Subcutaneous,  Once, 41 of 42 cycles Administration: 2.5 mg (06/17/2018), 2.5 mg (06/23/2018),  2.5 mg (06/30/2018), 2.5 mg (07/07/2018), 2.5 mg (07/14/2018), 2.5 mg (07/22/2018), 2.5 mg (07/29/2018), 2.5 mg (08/06/2018), 2.5 mg (08/13/2018), 2.5 mg (08/20/2018), 2.5 mg (08/27/2018), 2.5 mg (09/06/2018), 2.5 mg (09/13/2018), 2.5 mg (09/20/2018), 2.5 mg (09/27/2018), 2.5 mg (10/04/2018), 2.5 mg (10/11/2018), 2.5 mg (10/18/2018), 2.5 mg (10/25/2018), 2.5 mg (11/01/2018), 2.5 mg (11/08/2018), 2.5 mg (11/15/2018), 2.5 mg (11/22/2018), 2.5 mg (11/29/2018), 2.5 mg (12/08/2018), 2.5 mg (12/16/2018), 2.5 mg (12/27/2018), 2.5 mg (01/03/2019), 2.5 mg (01/10/2019), 2.5 mg (01/17/2019), 2.5 mg (01/24/2019), 2.5 mg (01/31/2019), 2.5 mg (02/07/2019), 2.5 mg (02/14/2019), 2.5 mg (02/21/2019), 2.5 mg (02/28/2019), 2.5 mg (03/07/2019), 2.5 mg (03/14/2019), 2.5 mg (03/21/2019), 2.5 mg (03/29/2019), 2.5 mg (04/04/2019) cyclophosphamide (CYTOXAN) 300 mg in sodium chloride 0.9 % 250 mL chemo infusion, 300 mg (100 % of original dose 300 mg), Intravenous,  Once, 26 of 26 cycles Dose modification: 300 mg (original dose 300 mg, Cycle 1, Reason: Change in SCr/CrCl), 500 mg (original dose 500 mg, Cycle 2, Reason: Provider Judgment), 500 mg (original dose 500 mg, Cycle 3, Reason: Change in SCr/CrCl) Administration: 300 mg (06/17/2018), 500 mg (06/23/2018), 500 mg (06/30/2018), 600 mg (07/07/2018), 600 mg (07/14/2018), 600 mg (07/22/2018), 600 mg (07/29/2018), 600 mg (08/06/2018), 600 mg (08/13/2018), 600 mg (08/20/2018), 600 mg (08/27/2018), 600 mg (09/06/2018), 600 mg (09/13/2018), 600 mg (09/20/2018), 600 mg (09/27/2018), 600 mg (10/04/2018), 600 mg (10/11/2018), 600 mg (10/18/2018), 600 mg (10/25/2018), 600 mg (11/01/2018), 600 mg (11/08/2018), 600 mg (11/15/2018), 600 mg (11/22/2018), 600 mg (11/29/2018), 600 mg (12/08/2018), 600 mg (12/16/2018)  for chemotherapy treatment.    04/21/2019 - 11/01/2019 Chemotherapy   The patient had ondansetron (ZOFRAN) injection 4 mg, 4 mg (100 % of original  dose 4 mg), Intravenous,  Once, 6 of 7 cycles Dose modification: 8 mg (original dose 4 mg, Cycle 2), 4 mg  (original dose 4 mg, Cycle 2), 4 mg (original dose 4 mg, Cycle 2) Administration: 4 mg (06/09/2019), 4 mg (06/10/2019), 4 mg (06/23/2019), 4 mg (06/24/2019), 4 mg (07/04/2019), 4 mg (07/05/2019), 4 mg (07/11/2019), 4 mg (07/12/2019), 4 mg (08/22/2019), 4 mg (08/23/2019), 4 mg (08/30/2019), 4 mg (08/31/2019), 4 mg (09/06/2019), 4 mg (09/07/2019), 4 mg (07/25/2019), 4 mg (07/26/2019), 4 mg (08/01/2019), 4 mg (08/02/2019), 4 mg (09/19/2019), 4 mg (09/20/2019), 4 mg (09/26/2019), 4 mg (09/27/2019), 4 mg (10/04/2019), 4 mg (10/05/2019), 4 mg (10/17/2019), 4 mg (10/18/2019), 4 mg (10/24/2019), 4 mg (10/25/2019), 4 mg (10/31/2019), 4 mg (11/01/2019) carfilzomib (KYPROLIS) 40 mg in dextrose 5 % 50 mL chemo infusion, 20 mg/m2 = 40 mg, Intravenous,  Once, 7 of 8 cycles Dose modification: 27 mg/m2 (original dose 27 mg/m2, Cycle 1, Reason: Provider Judgment) Administration: 40 mg (04/21/2019), 40 mg (04/22/2019), 60 mg (04/28/2019), 60 mg (04/29/2019), 60 mg (05/05/2019), 60 mg (05/06/2019), 56 mg (05/26/2019), 56 mg (05/27/2019), 56 mg (06/02/2019), 56 mg (06/03/2019), 56 mg (06/09/2019), 56 mg (06/10/2019), 56 mg (08/22/2019), 56 mg (08/23/2019), 56 mg (08/30/2019), 56 mg (08/31/2019), 56 mg (09/06/2019), 56 mg (09/07/2019), 56 mg (06/23/2019), 56 mg (06/24/2019), 56 mg (07/04/2019), 56 mg (07/05/2019), 56 mg (07/11/2019), 56 mg (07/12/2019), 56 mg (07/25/2019), 56 mg (07/26/2019), 56 mg (08/01/2019), 56 mg (08/02/2019), 56 mg (09/19/2019), 56 mg (09/20/2019), 56 mg (09/26/2019), 56 mg (09/27/2019), 56 mg (10/04/2019), 56 mg (10/05/2019), 56 mg (10/17/2019), 56 mg (10/18/2019), 56 mg (10/24/2019), 56 mg (10/25/2019), 56 mg (10/31/2019), 56 mg (11/01/2019)  for chemotherapy treatment.    11/21/2019 -  Chemotherapy   The patient had dexamethasone (DECADRON) 4 MG tablet, 40 mg (100 % of original dose 40 mg), Oral, Every 7 days, 3 of 11 cycles Dose modification: 40 mg (original dose 40 mg, Cycle 0) Administration: 20 mg (11/21/2019), 20 mg (11/28/2019), 20 mg (12/06/2019), 20 mg (12/12/2019), 20 mg  (12/19/2019) daratumumab-hyaluronidase-fihj (DARZALEX FASPRO) 1800-30000 MG-UT/15ML chemo SQ injection 1,800 mg, 1,800 mg, Subcutaneous,  Once, 2 of 10 cycles Administration: 1,800 mg (11/21/2019), 1,800 mg (11/28/2019), 1,800 mg (12/06/2019), 1,800 mg (12/12/2019), 1,800 mg (12/19/2019)  for chemotherapy treatment.      CANCER STAGING: Cancer Staging No matching staging information was found for the patient.  INTERVAL HISTORY:  Mr. Lonza Shimabukuro, a 59 y.o. male, returns for routine follow-up and consideration for next cycle of chemotherapy. Egon was last seen on 01/02/2020.  Due for day #8 of cycle #2 of Darzalex Faspro today.   Overall, he tells me he has been feeling pretty well. He reports that he is still adjusting to last week's changes. He saw his cardiologist on 10/8. He continues taking Demadex for his leg swelling, but denies orthopnea. His back pain is stable with Dilaudid. He denies having hematuria or hematochezia. His appetite is down to 25%.   REVIEW OF SYSTEMS:  Review of Systems  Constitutional: Positive for appetite change (25%) and fatigue (depleted).  Respiratory: Negative for chest tightness and shortness of breath.   Cardiovascular: Positive for leg swelling and palpitations.  Gastrointestinal: Negative for blood in stool.  Genitourinary: Negative for hematuria.   Musculoskeletal: Positive for back pain (7/10 lower right back pain).  Psychiatric/Behavioral: Positive for sleep disturbance.  All other systems reviewed and are negative.   PAST MEDICAL/SURGICAL HISTORY:  Past Medical History:  Diagnosis Date  . Allergy   .  Arthritis    neck and back  . Blood transfusion without reported diagnosis   . Bone metastases ([place]) 09/19/2019  . BPH (benign prostatic hyperplasia)   . [facility]) 2004   testicle  . Chronic kidney disease    kidney stones  . Left lumbar radiculopathy 06/12/2016  . 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 Medical history non-contributory    Pt  has scattered thoughts and uncertain of past medical history  . 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 Stones, urinary tract   . Substance abuse ([place])    prescribed oxydcodone- 10 years   Past Surgical History:  Procedure Laterality Date  . BACK SURGERY     x5  . CERVICAL DISC SURGERY     x2  . COLONOSCOPY WITH PROPOFOL N/A 08/11/2016   Dr. Gala Romney: non-bleeding internal hemorrhoids, one 4 mm hyperplastic rectal polyp, diverticulosis in entire examined colon  . POLYPECTOMY  08/11/2016   Procedure: POLYPECTOMY;  Surgeon: [name], MD;  Location: [facility] ENDO SUITE;  Service: Endoscopy;;  colon  . PORTACATH PLACEMENT Left 09/20/2018   Procedure: INSERTION PORT-A-CATH (catheter attached left subclavian);  Surgeon: Virl Cagey, MD;  Location: [facility] ORS;  Service: General;  Laterality: Left;  . testicular cancer  2004  . TONSILLECTOMY      SOCIAL HISTORY:  Social History   Socioeconomic History  . Marital status: Single    Spouse name: Not on file  . Number of children: 1  . Years of education: 23  . Highest education level: Not on file  Occupational History  . Occupation: retired    Comment: Psychologist, prison and probation services  . Occupation: disabled  Tobacco Use  . Smoking status: Current Every Day Smoker    Packs/day: 1.00    Years: 40.00    Pack years: 40.00    Types: Cigarettes    Start date: 03/31/1974  . Smokeless tobacco: Never Used  Vaping Use  . Vaping Use: Never used  Substance and Sexual Activity  . Alcohol use: Yes    Comment: Occasional  . Drug use: No    Types: Cocaine    Comment: Remote hx of cocaine, quit 1989  . Sexual activity: Yes  Other Topics Concern  . Not on file  Social History Narrative   [employer] for 12 years   [employer] for [employer] years      Never married   One daughter      Lives alone   Retail banker   Right-handed   Occasional caffeine use         Social Determinants of Health   Financial Resource Strain:   . Difficulty of Paying  Living Expenses: Not on file  Food Insecurity:   . Worried About Charity fundraiser in the Last Year: Not on file  . Ran Out of Food in the Last Year: Not on file  Transportation Needs:   . Lack of Transportation (Medical): Not on file  . Lack of Transportation (Non-Medical): Not on file  Physical Activity:   . Days of Exercise per Week: Not on file  . Minutes of Exercise per Session: Not on file  Stress:   . Feeling of Stress : Not on file  Social Connections:   . Frequency of Communication with Friends and Family: Not on file  . Frequency of Social Gatherings with Friends and Family: Not on file  . Attends Religious Services: Not on file  . Active Member of Clubs or Organizations: Not  on file  . Attends Archivist Meetings: Not on file  . Marital Status: Not on file  Intimate Partner Violence:   . Fear of Current or Ex-Partner: Not on file  . Emotionally Abused: Not on file  . Physically Abused: Not on file  . Sexually Abused: Not on file    FAMILY HISTORY:  Family History  Problem Relation Age of Onset  . COPD Mother   . Heart disease Mother   . Other Father        Never knew his father.  . Thyroid disease Sister   . Arthritis Sister   . Heart disease Sister        bypass  . Colon cancer Neg Hx     CURRENT MEDICATIONS:  Current Outpatient Medications  Medication Sig Dispense Refill  . aspirin EC 81 MG tablet Take 81 mg by mouth daily.    . calcitRIOL (ROCALTROL) 0.5 MCG capsule Take 0.5 mcg by mouth daily.     Marland Kitchen dexamethasone (DECADRON) 4 MG tablet Take 10 tablets (40 mg total) by mouth every 7 (seven) days. Take for 2 days starting the night of chemotherapy. 40 tablet 6  . Epoetin Alfa (PROCRIT IJ) Inject as directed. Unsure of dosage- gets this once weekly    . HYDROmorphone (DILAUDID) 4 MG tablet Take 1 tablet (4 mg total) by mouth every 3 (three) hours as needed for severe pain. 240 tablet 0  . Patiromer Sorbitex Calcium (VELTASSA PO) Take by mouth 2  (two) times a week.     . pomalidomide (POMALYST) 3 MG capsule Take 1 capsule (3 mg total) by mouth daily. 21 capsule 0  . senna (SENOKOT) 8.6 MG tablet Take 1 tablet by mouth 2 (two) times daily.     . sodium bicarbonate 650 MG tablet Take 1,300 mg by mouth 2 (two) times daily.    Marland Kitchen torsemide (DEMADEX) 20 MG tablet TAKING 1 TABLET DAILY, AND 2 TABLETS ONLY IF HIS ANKLES ARE REALLY SWOLLEN 180 tablet 1  . valACYclovir (VALTREX) 500 MG tablet TAKE 1 TABLET BY MOUTH TWICE A DAY 180 tablet 2  . prochlorperazine (COMPAZINE) 5 MG tablet Take 1 tablet (5 mg total) by mouth every 6 (six) hours as needed for nausea or vomiting. (Patient not taking: Reported on 01/09/2020) 30 tablet 2   No current facility-administered medications for this visit.   Facility-Administered Medications Ordered in Other Visits  Medication Dose Route Frequency Provider Last Rate Last Admin  . 0.9 %  sodium chloride infusion (Manually program via Guardrails IV Fluids)  250 mL Intravenous Once Derek Jack, MD      . acetaminophen (TYLENOL) tablet 650 mg  650 mg Oral Once Derek Jack, MD      . diphenhydrAMINE (BENADRYL) capsule 25 mg  25 mg Oral Once Derek Jack, MD      . heparin lock flush 100 unit/mL  500 Units Intracatheter Daily PRN Derek Jack, MD      . sodium chloride flush (NS) 0.9 % injection 10 mL  10 mL Intracatheter PRN Derek Jack, MD        ALLERGIES:  Allergies  Allergen Reactions  . Oxycodone-Acetaminophen   . Acetaminophen Nausea Only and Other (See Comments)    Elevated liver enzymes  . Cymbalta [Duloxetine Hcl] Swelling    Facial swelling  . Diclofenac Sodium Swelling       . Diclofenac Sodium Swelling  . Imodium [Loperamide] Swelling    facial  . Lyrica [Pregabalin] Swelling  .  Morphine Other (See Comments)    Bradycardia   . Vioxx [Rofecoxib] Swelling  . Aleve [Naproxen] Swelling    eye    PHYSICAL EXAM:  Performance status (ECOG): 1 -  Symptomatic but completely ambulatory  Vitals:   01/09/20 1012  BP: (!) 110/41  Pulse: (!) 48  Resp: 18  Temp: (!) 96.9 F (36.1 C)  SpO2: 100%   Wt Readings from Last 3 Encounters:  01/09/20 144 lb 9.6 oz (65.6 kg)  01/04/20 147 lb (66.7 kg)  01/02/20 147 lb 3.2 oz (66.8 kg)   Physical Exam Chest:     Comments: Port-a-Cath in L chest Musculoskeletal:     Right lower leg: Edema (2+) present.     Left lower leg: Edema (2+) present.     LABORATORY DATA:  I have reviewed the labs as listed.  CBC Latest Ref Rng & Units 01/09/2020 01/02/2020 12/26/2019  WBC 4.0 - 10.5 K/uL 1.8(L) 1.5(L) 0.7(LL)  Hemoglobin 13.0 - 17.0 g/dL 6.5(LL) 6.2(LL) 7.1(L)  Hematocrit 39 - 52 % 20.1(L) 18.2(L) 21.2(L)  Platelets 150 - 400 K/uL 21(LL) 23(LL) 14(LL)   CMP Latest Ref Rng & Units 01/09/2020 01/02/2020 12/26/2019  Glucose 70 - 99 mg/dL 82 92 90  BUN 8 - 23 mg/dL 56(H) 81(H) 76(H)  Creatinine 0.61 - 1.24 mg/dL 5.93(H) 6.22(H) 6.43(H)  Sodium 135 - 145 mmol/L 135 132(L) 132(L)  Potassium 3.5 - 5.1 mmol/L 4.7 4.7 4.9  Chloride 98 - 111 mmol/L 100 99 99  CO2 22 - 32 mmol/L 23 18(L) 20(L)  Calcium 8.9 - 10.3 mg/dL 8.6(L) 8.2(L) 8.7(L)  Total Protein 6.5 - 8.1 g/dL 5.9(L) 5.3(L) 5.8(L)  Total Bilirubin 0.3 - 1.2 mg/dL 1.1 0.8 1.4(H)  Alkaline Phos 38 - 126 U/L 130(H) 125 98  AST 15 - 41 U/L 60(H) 45(H) 59(H)  ALT 0 - 44 U/L 42 26 18   Lab Results  Component Value Date   LDH 577 (H) 01/09/2020   LDH 548 (H) 01/02/2020   LDH 1,272 (H) 12/26/2019    DIAGNOSTIC IMAGING:  I have independently reviewed the scans and discussed with the patient. No results found.   ASSESSMENT:  1. Kappa light chain myeloma, high risk, stage III: -CyBorD from 06/17/2018 through 04/04/2019 with progression. -He was evaluated for bone marrow transplant at [facility] but has refused it. -Carfilzomib, pomalidomide and dexamethasone from 04/21/2019. -Myeloma panel from 08/08/2019 shows M spike 0.1 g.  Previously this was not detectable. Kappa light chains are 561, lambda light chains 36.9 and ratio 15.2. Ratio was previously 11.2 and prior to that 16.1. -We had to held his carfilzomib day 15 and 16 of his last cycle because of elevated creatinine. -Daratumumab pomalidomide dexamethasone started on 11/21/2019.  2. Back pain: -MRI of the lumbar and thoracic spine on 04/20/2019 showed ventral epidural tumor posterior to the T12 vertebral body with mild spinal stenosis. -XRT to the back from 05/22/2019 through 05/27/2019. -MRI of the thoracic and lumbar spine on 10/06/2019 shows T7, T10, T11 and T12 lesions are slightly better seen on the study as the prior study was degraded by motion artifact. No new findings. Overall grossly stable appearing myelomatous changes at T12, L2 and L3 with no progressive findings. Right seventh and ninth posterior rib lesions are probably stable. Right seventh posterior rib lesion may be larger or just better seen. There does appear to be a new adjacent soft tissue component.   PLAN:  1. Kappa light chain myeloma: -Myeloma panel showed kappa  light chains ratio of 3094.  Kappa light chains also increased. -He has been off of Darzalex and Pomalyst for 2 to 3 weeks.  However his pancytopenia is not improving.  White count is 1.8 with hemoglobin 6.5 MCV 105, platelet count 21.  Neutrophil count is 0.9.  There are 4% blasts. -I have recommended bone marrow biopsy immediately.  We will arrange it as soon as possible. -Today he will receive 1 unit PRBC.  2. Back pain: -Chronic back pain and right lateral rib pain is stable.  He is taking Dilaudid 4 mg as needed.  3. Anemia: -Hemoglobin today 6.5.  Will transfuse 1 unit.  4. Myeloma bone disease: -Continue monthly denosumab.  5. ID/thromboprophylaxis: -Continue Valtrex and aspirin.  6. Irregular heart rate: -Cardiology referral was made because of prolonged QTC and frequent PVCs.   Orders placed  this encounter:  No orders of the defined types were placed in this encounter.    Derek Jack, MD [place] [phone]   I, Milinda Antis, am acting as a scribe for Dr. Sanda Linger.  I, Derek Jack MD, have reviewed the above documentation for accuracy and completeness, and I agree with the above.

## 2020-01-09 NOTE — Patient Instructions (Signed)
[place] at [facility] Discharge Instructions  You were seen today by Dr. Delton Coombes. He went over your recent results. You did not receive your treatment today, and instead you received 1 unit of blood and Retacrit. You will be scheduled for a bone marrow biopsy as soon as possible. Dr. Delton Coombes will see you back after the bone marrow biopsy for labs and follow up.   Thank you for choosing [place] at [facility] to provide your oncology and hematology care.  To afford each patient quality time with our provider, please arrive at least 15 minutes before your scheduled appointment time.   If you have a lab appointment with the [place] please come in thru the Main Entrance and check in at the main information desk  You need to re-schedule your appointment should you arrive 10 or more minutes late.  We strive to give you quality time with our providers, and arriving late affects you and other patients whose appointments are after yours.  Also, if you no show three or more times for appointments you may be dismissed from the clinic at the providers discretion.     Again, thank you for choosing [facility].  Our hope is that these requests will decrease the amount of time that you wait before being seen by our physicians.       _____________________________________________________________  Should you have questions after your visit to [facility], please contact our office at [phone] between the hours of 8:00 a.m. and 4:30 p.m.  Voicemails left after 4:00 p.m. will not be returned until the following business day.  For prescription refill requests, have your pharmacy contact our office and allow 72 hours.    Cancer Center Support Programs:   > Cancer Support Group  2nd Tuesday of the month 1pm-2pm, Journey Room

## 2020-01-10 ENCOUNTER — Other Ambulatory Visit: Payer: Self-pay

## 2020-01-10 ENCOUNTER — Inpatient Hospital Stay (HOSPITAL_COMMUNITY): Payer: Medicare Other

## 2020-01-10 ENCOUNTER — Inpatient Hospital Stay (HOSPITAL_BASED_OUTPATIENT_CLINIC_OR_DEPARTMENT_OTHER): Payer: Medicare Other | Admitting: Hematology

## 2020-01-10 VITALS — BP 139/65 | HR 98 | Temp 97.5°F | Resp 18

## 2020-01-10 DIAGNOSIS — C9000 Multiple myeloma not having achieved remission: Secondary | ICD-10-CM | POA: Diagnosis not present

## 2020-01-10 MED ORDER — ALTEPLASE 2 MG IJ SOLR
INTRAMUSCULAR | Status: AC
  Filled 2020-01-10: qty 2

## 2020-01-10 MED ORDER — ALTEPLASE 2 MG IJ SOLR
2.0000 mg | Freq: Once | INTRAMUSCULAR | Status: AC
  Administered 2020-01-10: 2 mg

## 2020-01-10 MED ORDER — HEPARIN SOD (PORK) LOCK FLUSH 100 UNIT/ML IV SOLN
500.0000 [IU] | Freq: Once | INTRAVENOUS | Status: AC
  Administered 2020-01-10: 500 [IU] via INTRAVENOUS

## 2020-01-10 MED ORDER — SODIUM CHLORIDE 0.9% FLUSH
10.0000 mL | Freq: Once | INTRAVENOUS | Status: AC
  Administered 2020-01-10: 10 mL via INTRAVENOUS

## 2020-01-10 MED ORDER — STERILE WATER FOR INJECTION IJ SOLN
INTRAMUSCULAR | Status: AC
  Filled 2020-01-10: qty 10

## 2020-01-10 MED ORDER — ACETAMINOPHEN 325 MG PO TABS
650.0000 mg | ORAL_TABLET | Freq: Once | ORAL | Status: AC
  Administered 2020-01-10: 650 mg via ORAL
  Filled 2020-01-10: qty 2

## 2020-01-10 MED ORDER — SODIUM CHLORIDE 0.9 % IV SOLN: INTRAVENOUS | Status: DC

## 2020-01-10 MED ORDER — DIPHENHYDRAMINE HCL 25 MG PO CAPS
25.0000 mg | ORAL_CAPSULE | Freq: Once | ORAL | Status: AC
  Administered 2020-01-10: 25 mg via ORAL
  Filled 2020-01-10: qty 1

## 2020-01-10 NOTE — Progress Notes (Signed)
No blood return noted by port today. Will do alteplase protocol.   0925-checked for blood return, 10 mls received. Will proceed as planned.  One unit of blood today per orders. Bone marrow biopsy dressing CDI. Treatment given per orders. Patient tolerated it well without problems. Vitals stable and discharged home from clinic via wheelchair in stable condition.  Follow up as scheduled.

## 2020-01-10 NOTE — Progress Notes (Signed)
INDICATION: Pancytopenia in the setting of multiple myeloma.    Bone Marrow Biopsy and Aspiration Procedure Note   The patient was identified by name and date of birth, prior to start of the procedure and a timeout was performed.   An informed consent was obtained after discussing potential risks including bleeding, infection and pain.  The left posterior iliac crest was palpated, cleaned with ChloraPrep, and drapes applied.  1% lidocaine is infiltrated into the skin, subcutaneous tissue and periosteum.  Bone marrow was aspirated.  Initially dry tap.  Second attempt yielded diluted sample.  With the help of Jamshidi needle a core biopsy was obtained.  Pressure was applied to the biopsy site and bandage was placed over the biopsy site. Patient was made to lie on the back for 15 mins prior to discharge.  The procedure was tolerated well. COMPLICATIONS: None BLOOD LOSS: none Patient was discharged home in stable condition to return in 2 weeks to review results.  Patient was provided with post bone marrow biopsy instructions and instructed to call if there was any bleeding or worsening pain.  Specimens sent for flow cytometry, cytogenetics and additional studies.  Signed Derek Jack, MD

## 2020-01-10 NOTE — Progress Notes (Signed)
Patient here today for bone marrow biopsy. Procedure explained and consent signed by all parties at 0747. Patient placed in prone position with both arms above head at 0751. Time out conducted at 0754 all parties agreed. Procedure started at 0757. Patient tolerated procedure well with minimal pain and discomfort. Specimens collected and labeled appropriately. Procedure completed at 0815. Dressing applied and patient reposition on back, sitting up, resting at 0817. Specimens taken to lab for processing. Patient stable, report given to primary RN.

## 2020-01-11 LAB — TYPE AND SCREEN
ABO/RH(D): A POS
Antibody Screen: POSITIVE
Unit division: 0

## 2020-01-12 LAB — SURGICAL PATHOLOGY

## 2020-01-13 ENCOUNTER — Telehealth: Payer: Self-pay | Admitting: Cardiology

## 2020-01-13 ENCOUNTER — Encounter: Payer: Self-pay | Admitting: Cardiology

## 2020-01-13 ENCOUNTER — Other Ambulatory Visit: Payer: Self-pay

## 2020-01-13 ENCOUNTER — Ambulatory Visit (INDEPENDENT_AMBULATORY_CARE_PROVIDER_SITE_OTHER): Payer: Medicare Other | Admitting: Cardiology

## 2020-01-13 VITALS — BP 129/67 | HR 96 | Ht 72.0 in | Wt 142.0 lb

## 2020-01-13 DIAGNOSIS — I493 Ventricular premature depolarization: Secondary | ICD-10-CM | POA: Diagnosis not present

## 2020-01-13 NOTE — Telephone Encounter (Signed)
PERCERT FOR  24 hour zio monitor - pvc's

## 2020-01-13 NOTE — Progress Notes (Signed)
Clinical Summary [name] is a 59 y.o.male seen today as a new consult, referred by Dr Larey Seat for the following medical problems.    1. 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 Echo at that time showed LVEF 65% - recently noted by heme/onc, frequent PVCs and long QT  - occasional palpitations, started about 3 weeks ago after some chemo changes - no other associated symptoms - no recent chest pains. No significant SOB/DOE   2. Anemia - prior transfusions  3. 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 CKD - has had discussions about HD    Past Medical History:  Diagnosis Date  . Allergy   . Arthritis    neck and back  . Blood transfusion without reported diagnosis   . Bone metastases ([place]) 09/19/2019  . BPH (benign prostatic hyperplasia)   . Cancer [facility]) 2004   testicle  . Chronic kidney disease    kidney stones  . Left lumbar radiculopathy 06/12/2016  . 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 Medical history non-contributory    Pt has scattered thoughts and uncertain of past medical history  . 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 Stones, urinary tract   . Substance abuse ([place])    prescribed oxydcodone- 10 years     Allergies  Allergen Reactions  . Oxycodone-Acetaminophen   . Acetaminophen Nausea Only and Other (See Comments)    Elevated liver enzymes  . Cymbalta [Duloxetine Hcl] Swelling    Facial swelling  . Diclofenac Sodium Swelling       . Diclofenac Sodium Swelling  . Imodium [Loperamide] Swelling    facial  . Lyrica [Pregabalin] Swelling  . Morphine Other (See Comments)    Bradycardia   . Vioxx [Rofecoxib] Swelling  . Aleve [Naproxen] Swelling    eye     Current Outpatient Medications  Medication Sig Dispense Refill  . aspirin EC 81 MG tablet Take 81 mg by mouth daily.    . calcitRIOL (ROCALTROL) 0.5 MCG capsule Take 0.5 mcg by mouth daily.     Marland Kitchen dexamethasone (DECADRON) 4 MG tablet Take 10 tablets (40  mg total) by mouth every 7 (seven) days. Take for 2 days starting the night of chemotherapy. 40 tablet 6  . Epoetin Alfa (PROCRIT IJ) Inject as directed. Unsure of dosage- gets this once weekly    . HYDROmorphone (DILAUDID) 4 MG tablet Take 1 tablet (4 mg total) by mouth every 3 (three) hours as needed for severe pain. 240 tablet 0  . Patiromer Sorbitex Calcium (VELTASSA PO) Take by mouth 2 (two) times a week.     . pomalidomide (POMALYST) 3 MG capsule Take 1 capsule (3 mg total) by mouth daily. 21 capsule 0  . prochlorperazine (COMPAZINE) 5 MG tablet Take 1 tablet (5 mg total) by mouth every 6 (six) hours as needed for nausea or vomiting. (Patient not taking: Reported on 01/09/2020) 30 tablet 2  . senna (SENOKOT) 8.6 MG tablet Take 1 tablet by mouth 2 (two) times daily.     . sodium bicarbonate 650 MG tablet Take 1,300 mg by mouth 2 (two) times daily.    Marland Kitchen torsemide (DEMADEX) 20 MG tablet TAKING 1 TABLET DAILY, AND 2 TABLETS ONLY IF HIS ANKLES ARE REALLY SWOLLEN 180 tablet 1  . valACYclovir (VALTREX) 500 MG tablet TAKE 1 TABLET BY MOUTH TWICE A DAY 180 tablet 2   No current facility-administered medications for this visit.  Past Surgical History:  Procedure Laterality Date  . BACK SURGERY     x5  . CERVICAL DISC SURGERY     x2  . COLONOSCOPY WITH PROPOFOL N/A 08/11/2016   Dr. Gala Romney: non-bleeding internal hemorrhoids, one 4 mm hyperplastic rectal polyp, diverticulosis in entire examined colon  . POLYPECTOMY  08/11/2016   Procedure: POLYPECTOMY;  Surgeon: [name], MD;  [facility];  Service: Endoscopy;;  colon  . PORTACATH PLACEMENT Left 09/20/2018   Procedure: INSERTION PORT-A-CATH (catheter attached left subclavian);  Surgeon: Virl Cagey, MD;  Location: [facility] ORS;  Service: General;  Laterality: Left;  . testicular cancer  2004  . TONSILLECTOMY       Allergies  Allergen Reactions  . Oxycodone-Acetaminophen   . Acetaminophen Nausea Only and Other (See  Comments)    Elevated liver enzymes  . Cymbalta [Duloxetine Hcl] Swelling    Facial swelling  . Diclofenac Sodium Swelling       . Diclofenac Sodium Swelling  . Imodium [Loperamide] Swelling    facial  . Lyrica [Pregabalin] Swelling  . Morphine Other (See Comments)    Bradycardia   . Vioxx [Rofecoxib] Swelling  . Aleve [Naproxen] Swelling    eye      Family History  Problem Relation Age of Onset  . COPD Mother   . Heart disease Mother   . Other Father        Never knew his father.  . Thyroid disease Sister   . Arthritis Sister   . Heart disease Sister        bypass  . Colon cancer Neg Hx      Social History [name] reports that he has been smoking cigarettes. He started smoking about 45 years ago. He has a 40.00 pack-year smoking history. He has never used smokeless tobacco. [name] reports current alcohol use.   Review of Systems CONSTITUTIONAL: No weight loss, fever, chills, weakness or fatigue.  HEENT: Eyes: No visual loss, blurred vision, double vision or yellow sclerae.No hearing loss, sneezing, congestion, runny nose or sore throat.  SKIN: No rash or itching.  CARDIOVASCULAR: per hpi RESPIRATORY: No shortness of breath, cough or sputum.  GASTROINTESTINAL: No anorexia, nausea, vomiting or diarrhea. No abdominal pain or blood.  GENITOURINARY: No burning on urination, no polyuria NEUROLOGICAL: No headache, dizziness, syncope, paralysis, ataxia, numbness or tingling in the extremities. No change in bowel or bladder control.  MUSCULOSKELETAL: No muscle, back pain, joint pain or stiffness.  LYMPHATICS: No enlarged nodes. No history of splenectomy.  PSYCHIATRIC: No history of depression or anxiety.  ENDOCRINOLOGIC: No reports of sweating, cold or heat intolerance. No polyuria or polydipsia.  Marland Kitchen   Physical Examination Today's Vitals   01/13/20 1307  BP: 129/67  Pulse: 96  SpO2: 97%  Weight: 142 lb (64.4 kg)  Height: 6' (1.829 m)   Body mass  index is 19.26 kg/m.  Gen: resting comfortably, no acute distress HEENT: no scleral icterus, pupils equal round and reactive, no palptable cervical adenopathy,  CV: RRR, no m/r/g, no jvd Resp: Clear to auscultation bilaterally GI: abdomen is soft, non-tender, non-distended, normal bowel sounds, no hepatosplenomegaly MSK: extremities are warm, no edema.  Skin: warm, no rash Neuro:  no focal deficits Psych: appropriate affect   Diagnostic Studies  05/2018 echo IMPRESSIONS    1. The left ventricle was not well visualized. The cavity size was  normal. Left ventricular diastolic Doppler parameters are indeterminate.  2. The right ventricle has normal  systolic function. The cavity was  normal. There is no increase in right ventricular wall thickness.  3. The mitral valve is grossly normal.  4. The aortic valve was not well visualized Mild thickening of the aortic  valve Mild calcification of the aortic valve.  5. Poor acoustic windows limit study Overall LVEF is probably grossly  normal. Consider limited echo with Definity to further define wall motion,  LVEF.    Assessment and Plan   1. PVCs -chronic, prior workup in 2012 with benign echo - will obtain a 24 hour holter to better quantify PVC burden, eval for any NSVT - pending monitor may consider echo with contrast, study last year limited visualization - in absence of chest pains, particularly with his cancer, anemia, advanced kidney disease would not pursue an ischemic workup in setting of his PVCs.        Arnoldo Lenis, M.D..

## 2020-01-13 NOTE — Patient Instructions (Signed)
Medication Instructions:  Continue all current medications.  Labwork: none  Testing/Procedures:  Your physician has recommended that you wear a 24 hour event monitor. Event monitors are medical devices that record the hearts electrical activity. Doctors most often [name] these monitors to diagnose arrhythmias. Arrhythmias are problems with the speed or rhythm of the heartbeat. The monitor is a small, portable device. You can wear one while you do your normal daily activities. This is usually used to diagnose what is causing palpitations/syncope (passing out).  Office will contact with results via phone or letter.    Follow-Up: Pending results   Any Other Special Instructions Will Be Listed Below (If Applicable).  If you need a refill on your cardiac medications before your next appointment, please call your pharmacy.

## 2020-01-16 ENCOUNTER — Encounter (HOSPITAL_COMMUNITY): Payer: Self-pay | Admitting: Hematology

## 2020-01-16 ENCOUNTER — Inpatient Hospital Stay (HOSPITAL_COMMUNITY): Payer: Medicare Other

## 2020-01-16 ENCOUNTER — Inpatient Hospital Stay (HOSPITAL_BASED_OUTPATIENT_CLINIC_OR_DEPARTMENT_OTHER): Payer: Medicare Other | Admitting: Hematology

## 2020-01-16 ENCOUNTER — Other Ambulatory Visit: Payer: Self-pay

## 2020-01-16 VITALS — BP 136/56 | HR 60 | Temp 97.7°F | Resp 18 | Wt 142.4 lb

## 2020-01-16 DIAGNOSIS — C9000 Multiple myeloma not having achieved remission: Secondary | ICD-10-CM | POA: Diagnosis not present

## 2020-01-16 LAB — CBC WITH DIFFERENTIAL/PLATELET
Abs Immature Granulocytes: 0.25 10*3/uL — ABNORMAL HIGH (ref 0.00–0.07)
Band Neutrophils: 1 %
Basophils Absolute: 0 10*3/uL (ref 0.0–0.1)
Basophils Relative: 1 %
Blasts: 2 %
Eosinophils Absolute: 0 10*3/uL (ref 0.0–0.5)
Eosinophils Relative: 1 %
HCT: 20.9 % — ABNORMAL LOW (ref 39.0–52.0)
Hemoglobin: 7 g/dL — ABNORMAL LOW (ref 13.0–17.0)
Lymphocytes Relative: 42 %
Lymphs Abs: 0.8 10*3/uL (ref 0.7–4.0)
MCH: 33 pg (ref 26.0–34.0)
MCHC: 33.5 g/dL (ref 30.0–36.0)
MCV: 98.6 fL (ref 80.0–100.0)
Metamyelocytes Relative: 2 %
Monocytes Absolute: 0.3 10*3/uL (ref 0.1–1.0)
Monocytes Relative: 16 %
Myelocytes: 0 %
Neutro Abs: 0.5 10*3/uL — ABNORMAL LOW (ref 1.7–7.7)
Neutrophils Relative %: 24 %
Other: 0 %
Platelets: 13 10*3/uL — CL (ref 150–400)
Promyelocytes Relative: 11 %
RBC: 2.12 MIL/uL — ABNORMAL LOW (ref 4.22–5.81)
RDW: 26 % — ABNORMAL HIGH (ref 11.5–15.5)
WBC: 1.9 10*3/uL — ABNORMAL LOW (ref 4.0–10.5)
nRBC: 0 % (ref 0.0–0.2)
nRBC: 0 /100 WBC

## 2020-01-16 LAB — COMPREHENSIVE METABOLIC PANEL
ALT: 35 U/L (ref 0–44)
AST: 77 U/L — ABNORMAL HIGH (ref 15–41)
Albumin: 3.1 g/dL — ABNORMAL LOW (ref 3.5–5.0)
Alkaline Phosphatase: 122 U/L (ref 38–126)
Anion gap: 15 (ref 5–15)
BUN: 49 mg/dL — ABNORMAL HIGH (ref 8–23)
CO2: 23 mmol/L (ref 22–32)
Calcium: 9.5 mg/dL (ref 8.9–10.3)
Chloride: 97 mmol/L — ABNORMAL LOW (ref 98–111)
Creatinine, Ser: 6.1 mg/dL — ABNORMAL HIGH (ref 0.61–1.24)
GFR, Estimated: 9 mL/min — ABNORMAL LOW (ref >60)
Glucose, Bld: 87 mg/dL (ref 70–99)
Potassium: 4.7 mmol/L (ref 3.5–5.1)
Sodium: 135 mmol/L (ref 135–145)
Total Bilirubin: 1.1 mg/dL (ref 0.3–1.2)
Total Protein: 6.4 g/dL — ABNORMAL LOW (ref 6.5–8.1)

## 2020-01-16 LAB — PREPARE RBC (CROSSMATCH)

## 2020-01-16 LAB — SAMPLE TO BLOOD BANK

## 2020-01-16 MED ORDER — SODIUM CHLORIDE 0.9% FLUSH
10.0000 mL | Freq: Once | INTRAVENOUS | Status: AC
  Administered 2020-01-16: 10 mL

## 2020-01-16 MED ORDER — HEPARIN SOD (PORK) LOCK FLUSH 100 UNIT/ML IV SOLN
500.0000 [IU] | Freq: Once | INTRAVENOUS | Status: AC
  Administered 2020-01-16: 500 [IU] via INTRAVENOUS

## 2020-01-16 NOTE — Progress Notes (Signed)
[name], [place] [address]   CLINIC:  Medical Oncology/Hematology  PCP:  Brandon Fire, MD [place] / [place] [address]  [phone]  REASON FOR VISIT:  Follow-up for kappa light chain myeloma  PRIOR THERAPY:  1. CyBorD from 06/17/2018 to 04/04/2019. 2. Carfilzomib x 7 cycles from 04/21/2019 to 11/01/2019.  CURRENT THERAPY: Under work-up  INTERVAL HISTORY:  [name], a 59 y.o. male, returns for routine follow-up for his kappa light chain myeloma. [name] was last seen on 01/10/2020.  Today he reports feeling okay. He has been taking Pomalyst for the past 3 weeks. He reports having 3 episodes of numbness in his lower lip, the first one being when he was having the bone marrow biopsy done, and the most recent one being on 10/17 while watching football; he denies having tachypnea prior to it. He saw his cardiologist on 10/15; he does not see an ophthalmologist. He has occasional white lines flashing at the periphery of both eyes but it does not interfere with his vision.   REVIEW OF SYSTEMS:  Review of Systems  Constitutional: Positive for appetite change (75%) and fatigue (25%).  Eyes: Positive for eye problems (white lines flashing in periphery of eyes).  Cardiovascular: Positive for leg swelling (feet swelling).  Musculoskeletal: Positive for back pain (7/10 lower back and legs pain).  Neurological: Positive for headaches and numbness (bottom lip).  Psychiatric/Behavioral: Positive for depression and sleep disturbance. The patient is nervous/anxious.   All other systems reviewed and are negative.   PAST MEDICAL/SURGICAL HISTORY:  Past Medical History:  Diagnosis Date  . Allergy   . Arthritis    neck and back  . Blood transfusion without reported diagnosis   . Bone metastases ([place]) 09/19/2019  . BPH (benign prostatic hyperplasia)   . Cancer [facility]) 2004   testicle  . Chronic kidney disease    kidney  stones  . Left lumbar radiculopathy 06/12/2016  . 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 Medical history non-contributory    Pt has scattered thoughts and uncertain of past medical history  . 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 Stones, urinary tract   . Substance abuse ([place])    prescribed oxydcodone- 10 years   Past Surgical History:  Procedure Laterality Date  . BACK SURGERY     x5  . CERVICAL DISC SURGERY     x2  . COLONOSCOPY WITH PROPOFOL N/A 08/11/2016   Dr. Gala Rowland: non-bleeding internal hemorrhoids, one 4 mm hyperplastic rectal polyp, diverticulosis in entire examined colon  . POLYPECTOMY  08/11/2016   Procedure: POLYPECTOMY;  Surgeon: Brandon Dolin, MD;  [facility];  Service: Endoscopy;;  colon  . PORTACATH PLACEMENT Left 09/20/2018   Procedure: INSERTION PORT-A-CATH (catheter attached left subclavian);  Surgeon: Virl Cagey, MD;  Location: [facility] ORS;  Service: General;  Laterality: Left;  . testicular cancer  2004  . TONSILLECTOMY      SOCIAL HISTORY:  Social History   Socioeconomic History  . Marital status: Single    Spouse name: Not on file  . Number of children: 1  . Years of education: 69  . Highest education level: Not on file  Occupational History  . Occupation: retired    Comment: Psychologist, prison and probation services  . Occupation: disabled  Tobacco Use  . Smoking status: Current Every Day Smoker    Packs/day: 1.00    Years: 40.00    Pack years: 40.00    Types: Cigarettes  Start date: 03/31/1974  . Smokeless tobacco: Never Used  Vaping Use  . Vaping Use: Never used  Substance and Sexual Activity  . Alcohol use: Yes    Comment: Occasional  . Drug use: No    Types: Cocaine    Comment: Remote hx of cocaine, quit 1989  . Sexual activity: Yes  Other Topics Concern  . Not on file  Social History Narrative   [employer] for 12 years   [employer] for 55 years      Never married   One daughter      Lives alone   Retail banker   Right-handed    Occasional caffeine use         Social Determinants of Health   Financial Resource Strain:   . Difficulty of Paying Living Expenses: Not on file  Food Insecurity:   . Worried About Charity fundraiser in the Last Year: Not on file  . Ran Out of Food in the Last Year: Not on file  Transportation Needs:   . Lack of Transportation (Medical): Not on file  . Lack of Transportation (Non-Medical): Not on file  Physical Activity:   . Days of Exercise per Week: Not on file  . Minutes of Exercise per Session: Not on file  Stress:   . Feeling of Stress : Not on file  Social Connections:   . Frequency of Communication with Friends and Family: Not on file  . Frequency of Social Gatherings with Friends and Family: Not on file  . Attends Religious Services: Not on file  . Active Member of Clubs or Organizations: Not on file  . Attends Archivist Meetings: Not on file  . Marital Status: Not on file  Intimate Partner Violence:   . Fear of Current or Ex-Partner: Not on file  . Emotionally Abused: Not on file  . Physically Abused: Not on file  . Sexually Abused: Not on file    FAMILY HISTORY:  Family History  Problem Relation Age of Onset  . COPD Mother   . Heart disease Mother   . Other Father        Never knew his father.  . Thyroid disease Sister   . Arthritis Sister   . Heart disease Sister        bypass  . Colon cancer Neg Hx     CURRENT MEDICATIONS:  Current Outpatient Medications  Medication Sig Dispense Refill  . aspirin EC 81 MG tablet Take 81 mg by mouth daily.    . calcitRIOL (ROCALTROL) 0.5 MCG capsule Take 0.5 mcg by mouth daily.     Marland Kitchen dexamethasone (DECADRON) 4 MG tablet Take 10 tablets (40 mg total) by mouth every 7 (seven) days. Take for 2 days starting the night of chemotherapy. 40 tablet 6  . Epoetin Alfa (PROCRIT IJ) Inject as directed. Unsure of dosage- gets this once weekly    . HYDROmorphone (DILAUDID) 4 MG tablet Take 1 tablet (4 mg total) by mouth  every 3 (three) hours as needed for severe pain. 240 tablet 0  . Patiromer Sorbitex Calcium (VELTASSA PO) Take by mouth 2 (two) times a week.     . pomalidomide (POMALYST) 3 MG capsule Take 1 capsule (3 mg total) by mouth daily. 21 capsule 0  . prochlorperazine (COMPAZINE) 5 MG tablet Take 1 tablet (5 mg total) by mouth every 6 (six) hours as needed for nausea or vomiting. 30 tablet 2  . senna (  SENOKOT) 8.6 MG tablet Take 1 tablet by mouth 2 (two) times daily.     . sodium bicarbonate 650 MG tablet Take 1,300 mg by mouth 2 (two) times daily.    Marland Kitchen torsemide (DEMADEX) 20 MG tablet TAKING 1 TABLET DAILY, AND 2 TABLETS ONLY IF HIS ANKLES ARE REALLY SWOLLEN 180 tablet 1  . valACYclovir (VALTREX) 500 MG tablet TAKE 1 TABLET BY MOUTH TWICE A DAY 180 tablet 2   No current facility-administered medications for this visit.    ALLERGIES:  Allergies  Allergen Reactions  . Oxycodone-Acetaminophen   . Acetaminophen Nausea Only and Other (See Comments)    Elevated liver enzymes  . Cymbalta [Duloxetine Hcl] Swelling    Facial swelling  . Diclofenac Sodium Swelling       . Diclofenac Sodium Swelling  . Imodium [Loperamide] Swelling    facial  . Lyrica [Pregabalin] Swelling  . Morphine Other (See Comments)    Bradycardia   . Vioxx [Rofecoxib] Swelling  . Aleve [Naproxen] Swelling    eye    PHYSICAL EXAM:  Performance status (ECOG): 1 - Symptomatic but completely ambulatory  Vitals:   01/16/20 0954  BP: (!) 136/56  Pulse: 60  Resp: 18  Temp: 97.7 F (36.5 C)  SpO2: 100%   Wt Readings from Last 3 Encounters:  01/16/20 142 lb 6.7 oz (64.6 kg)  01/13/20 142 lb (64.4 kg)  01/09/20 144 lb 9.6 oz (65.6 kg)   Physical Exam Vitals reviewed.  Constitutional:      Appearance: Normal appearance.  Chest:     Comments: Port-a-Cath in L chest Neurological:     General: No focal deficit present.     Mental Status: He is alert and oriented to person, place, and time.  Psychiatric:         Mood and Affect: Mood normal.        Behavior: Behavior normal.     LABORATORY DATA:  I have reviewed the labs as listed.  CBC Latest Ref Rng & Units 01/09/2020 01/02/2020 12/26/2019  WBC 4.0 - 10.5 K/uL 1.8(L) 1.5(L) 0.7(LL)  Hemoglobin 13.0 - 17.0 g/dL 6.5(LL) 6.2(LL) 7.1(L)  Hematocrit 39 - 52 % 20.1(L) 18.2(L) 21.2(L)  Platelets 150 - 400 K/uL 21(LL) 23(LL) 14(LL)   CMP Latest Ref Rng & Units 01/16/2020 01/09/2020 01/02/2020  Glucose 70 - 99 mg/dL 87 82 92  BUN 8 - 23 mg/dL 49(H) 56(H) 81(H)  Creatinine 0.61 - 1.24 mg/dL 6.10(H) 5.93(H) 6.22(H)  Sodium 135 - 145 mmol/L 135 135 132(L)  Potassium 3.5 - 5.1 mmol/L 4.7 4.7 4.7  Chloride 98 - 111 mmol/L 97(L) 100 99  CO2 22 - 32 mmol/L 23 23 18(L)  Calcium 8.9 - 10.3 mg/dL 9.5 8.6(L) 8.2(L)  Total Protein 6.5 - 8.1 g/dL 6.4(L) 5.9(L) 5.3(L)  Total Bilirubin 0.3 - 1.2 mg/dL 1.1 1.1 0.8  Alkaline Phos 38 - 126 U/L 122 130(H) 125  AST 15 - 41 U/L 77(H) 60(H) 45(H)  ALT 0 - 44 U/L 35 42 26      Component Value Date/Time   RBC 1.91 (L) 01/09/2020 0933   MCV 105.2 (H) 01/09/2020 0933   MCH 34.0 01/09/2020 0933   MCHC 32.3 01/09/2020 0933   RDW Not Measured 01/09/2020 0933   LYMPHSABS 0.6 (L) 01/09/2020 0933   MONOABS 0.0 (L) 01/09/2020 0933   EOSABS 0.0 01/09/2020 0933   BASOSABS 0.0 01/09/2020 0933    Surgical pathology ([id number]) on 01/10/2020: Bone marrow aspirate, clot, core: hypercellular bone marrow  with extensive involvement by plasma cell neoplasm by 96%.  DIAGNOSTIC IMAGING:  I have independently reviewed the scans and discussed with the patient. No results found.   ASSESSMENT:  1. Kappa light chain myeloma, high risk, stage III: -CyBorD from 06/17/2018 through 04/04/2019 with progression. -He was evaluated for bone marrow transplant at [facility] but has refused it. -Carfilzomib, pomalidomide and dexamethasone from 04/21/2019. -Myeloma panel from 08/08/2019 shows M spike 0.1 g. Previously this was not  detectable. Kappa light chains are 561, lambda light chains 36.9 and ratio 15.2. Ratio was previously 11.2 and prior to that 16.1. -We had to held his carfilzomib day 15 and 16 of his last cycle because of elevated creatinine. -Daratumumab pomalidomide dexamethasone started on 11/21/2019.  2. Back pain: -MRI of the lumbar and thoracic spine on 04/20/2019 showed ventral epidural tumor posterior to the T12 vertebral body with mild spinal stenosis. -XRT to the back from 05/22/2019 through 05/27/2019. -MRI of the thoracic and lumbar spine on 10/06/2019 shows T7, T10, T11 and T12 lesions are slightly better seen on the study as the prior study was degraded by motion artifact. No new findings. Overall grossly stable appearing myelomatous changes at T12, L2 and L3 with no progressive findings. Right seventh and ninth posterior rib lesions are probably stable. Right seventh posterior rib lesion may be larger or just better seen. There does appear to be a new adjacent soft tissue component.   PLAN:  1. Kappa light chain myeloma: -We reviewed bone marrow biopsy results which showed 97% plasma cells. -He has developed severe cytopenias from marrow infiltration from myeloma. -Recommend change in therapy.  Because of his renal function and cytopenias, options are very limited. -He has not received elotuzumab.  Other options include belantamab which requires monitoring of his vision prior to start of therapy and prior to each cycle.  We will make referral to ophthalmology. -I will also reach out to myeloma expert at [facility] or [facility] for further recommendations.  2. Back pain: -Chronic back pain and right lateral rib pain stable with Dilaudid 4 mg as needed.  3. Anemia: -Hemoglobin today 7.0.  He will receive 1 unit PRBC.  He will also receive 1 unit platelets for platelet count of 13.  4. Myeloma bone disease: -Continue monthly denosumab.  5. ID/thromboprophylaxis: -Continue Valtrex and  aspirin.  6. Irregular heart rate: -He was evaluated by Dr. Harl Bowie.  Holter monitoring was recommended.  Orders placed this encounter:  No orders of the defined types were placed in this encounter.    Derek Jack, MD [place] [phone]   I, Milinda Antis, am acting as a scribe for Dr. Sanda Linger.  I, Derek Jack MD, have reviewed the above documentation for accuracy and completeness, and I agree with the above.

## 2020-01-16 NOTE — Progress Notes (Signed)
CRITICAL VALUE STICKER  CRITICAL VALUE: Plts 13  DATE & TIME NOTIFIED: 01/16/2020 @ 1055  MESSENGER (representative from lab): Deanne Coffer    MD NOTIFIED: Dr. Delton Coombes  TIME OF NOTIFICATION: 1105  RESPONSE: Dr. Raliegh Ip states that patient needs 1 unit platelets and 1 unit of packed RBC.

## 2020-01-16 NOTE — Addendum Note (Signed)
Addended by: Benjiman Core D on: 01/16/2020 12:07 PM   Modules accepted: Orders

## 2020-01-16 NOTE — Patient Instructions (Signed)
[place] [facility] Discharge Instructions  You were seen today by Dr. Delton Coombes. He went over your recent results; your bone marrow biopsy shows 96% of your bone marrow consists of plasma cells. Your treatment regimen will change to elotuzumab and belantamab for your myeloma. You will be referred to an optometrist to have your eyes checked before beginning your treatment. You will also be scheduled for 1 unit of blood and 1 unit of platelets. Dr. Delton Coombes will see you back in 2-3 days for labs and follow up.   Thank you for choosing [place] at [facility] to provide your oncology and hematology care.  To afford each patient quality time with our provider, please arrive at least 15 minutes before your scheduled appointment time.   If you have a lab appointment with the [facility] please come in thru the Main Entrance and check in at the main information desk  You need to re-schedule your appointment should you arrive 10 or more minutes late.  We strive to give you quality time with our providers, and arriving late affects you and other patients whose appointments are after yours.  Also, if you no show three or more times for appointments you may be dismissed from the clinic at the providers discretion.     Again, thank you for choosing [facility].  Our hope is that these requests will decrease the amount of time that you wait before being seen by our physicians.       _____________________________________________________________  Should you have questions after your visit to [facility], please contact our office at [phone] between the hours of 8:00 a.m. and 4:30 p.m.  Voicemails left after 4:00 p.m. will not be returned until the following business day.  For prescription refill requests, have your pharmacy contact our office and allow 72 hours.    Cancer Center Support Programs:   > Cancer Support Group   2nd Tuesday of the month 1pm-2pm, Journey Room

## 2020-01-16 NOTE — Progress Notes (Signed)
Message received from [place] RN/ Dr. Delton Coombes. NO Treatment today. Dr. Delton Coombes will change treatment plan. Labs pending. BB sample drawn and blue armband placed on patient.   Per Dr. Delton Coombes. Infuse 1 Unit of Platelets and 1 Unit of PRBC's 01/17/20. Patient has antibodies. Called Rolan Lipa lab tech in blood bank and informed tech orders placed for blood products tomorrow.   Vital signs stable. No complaints at this time. Discharged from clinic ambulatory in stable condition. Alert and oriented x 3. F/U with [facility] as scheduled.  Schedule printed and appointment time for blood products given to patient. Understanding verbalized. Patient verbalized understanding to leave blue blood bracelet on.

## 2020-01-17 ENCOUNTER — Inpatient Hospital Stay (HOSPITAL_COMMUNITY): Payer: Medicare Other

## 2020-01-17 ENCOUNTER — Encounter (HOSPITAL_COMMUNITY): Payer: Self-pay

## 2020-01-17 ENCOUNTER — Other Ambulatory Visit (HOSPITAL_COMMUNITY): Payer: Self-pay | Admitting: *Deleted

## 2020-01-17 ENCOUNTER — Encounter (HOSPITAL_COMMUNITY): Payer: Self-pay | Admitting: *Deleted

## 2020-01-17 VITALS — BP 150/89 | HR 100 | Temp 96.9°F | Resp 18

## 2020-01-17 DIAGNOSIS — C9000 Multiple myeloma not having achieved remission: Secondary | ICD-10-CM | POA: Diagnosis not present

## 2020-01-17 DIAGNOSIS — D696 Thrombocytopenia, unspecified: Secondary | ICD-10-CM

## 2020-01-17 MED ORDER — SODIUM CHLORIDE 0.9% FLUSH
10.0000 mL | Freq: Once | INTRAVENOUS | Status: AC
  Administered 2020-01-17: 10 mL

## 2020-01-17 MED ORDER — DIPHENHYDRAMINE HCL 25 MG PO CAPS
25.0000 mg | ORAL_CAPSULE | Freq: Once | ORAL | Status: AC
  Administered 2020-01-17: 25 mg via ORAL

## 2020-01-17 MED ORDER — ACETAMINOPHEN 325 MG PO TABS
650.0000 mg | ORAL_TABLET | Freq: Once | ORAL | Status: AC
  Administered 2020-01-17: 650 mg via ORAL

## 2020-01-17 MED ORDER — HEPARIN SOD (PORK) LOCK FLUSH 100 UNIT/ML IV SOLN
500.0000 [IU] | Freq: Once | INTRAVENOUS | Status: AC
  Administered 2020-01-17: 500 [IU] via INTRAVENOUS

## 2020-01-17 MED ORDER — LACTULOSE 20 GM/30ML PO SOLN: ORAL | 2 refills | Status: DC

## 2020-01-17 MED ORDER — SODIUM CHLORIDE 0.9% IV SOLUTION
250.0000 mL | Freq: Once | INTRAVENOUS | Status: AC
  Administered 2020-01-17: 250 mL via INTRAVENOUS

## 2020-01-17 MED ORDER — DEXAMETHASONE 4 MG PO TABS: 40.0000 mg | ORAL_TABLET | Freq: Every day | ORAL | 0 refills | Status: DC

## 2020-01-17 NOTE — Telephone Encounter (Signed)
Orders placed per Dr. Delton Coombes for decadron 40 mg by mouth once daily times 4 days.

## 2020-01-17 NOTE — Progress Notes (Signed)
Patient presents today for 1 Unit of PRBC's and 1 Unit of Platelets. Vital signs stable. Patient has complaints of chronic back pain he rates a 7/10. MAR reviewed. Patient has complaints of shortness of breath on exertion and used a wheel chair on arrival.   1 Unit of Platelets and 1 Unit of PRBC's  given today per MD orders. Tolerated infusion without adverse affects. Vital signs stable. No complaints at this time. Discharged from clinic ambulatory in stable condition. Alert and oriented x 3. F/U with [facility] as scheduled.

## 2020-01-17 NOTE — Patient Instructions (Signed)
[facility] at [facility]  Discharge Instructions:   _______________________________________________________________  Thank you for choosing [facility] at [facility] to provide your oncology and hematology care.  To afford each patient quality time with our providers, please arrive at least 15 minutes before your scheduled appointment.  You need to re-schedule your appointment if you arrive 10 or more minutes late.  We strive to give you quality time with our providers, and arriving late affects you and other patients whose appointments are after yours.  Also, if you no show three or more times for appointments you may be dismissed from the clinic.  Again, thank you for choosing [facility] at [facility]. Our hope is that these requests will allow you access to exceptional care and in a timely manner. _______________________________________________________________  If you have questions after your visit, please contact our office at [phone] between the hours of 8:30 a.m. and 5:00 p.m. Voicemails left after 4:30 p.m. will not be returned until the following business day. _______________________________________________________________  For prescription refill requests, have your pharmacy contact our office. _______________________________________________________________  Recommendations made by the consultant and any test results will be sent to your referring physician. _______________________________________________________________ 

## 2020-01-17 NOTE — Progress Notes (Signed)
I had extensive conversation with patient's daughter today.  I educated her on the course of her dad's disease.  I explained the rationale for the interventions up to this point.  She voiced concerns for her dad regarding constipation, pain and appetite.  I advised about the upcoming appointment with the doctors at [facility].    I called patient after our conversation.  He states that he has been unable to have a full bowel movement.  He describes it as small marbles if anything.  He states that he is taking benefiber and stool softners every day.  I explained that he can hold the benefiber as it is not helping his constipation.  He verbalized understanding.  We discussed his pain.  He states that he is taking his pain medication every 3 hours.  The medication is only lasting about 2 hours and never really touches the pain.  He states that he is scared to take any more than he is because he doesn't want to wake up with difficulty breathing.    He voiced his feelings with me and was tearful when saying "I'm scared".  He said he was hoping the last blood transfusion would work and it has not.  He is scared of dying.  I provided time for him to talk and expressed my understanding and hearing his concerns and fears.  I validated those fears and explained that he needs to talk about them and not keep them to himself.    I spoke with Dr. Delton Coombes about the above and orders received.  Per Dr. Delton Coombes, patient will take dexamethasone 40 mg daily x 4 days, this was after talking to the doctor at [facility].    I returned patient call and explained the directions.  He verbalizes understanding and wrote it down and read it back to me.    Patient will follow up with [facility] Dr. Aris Lot when they schedule the appointment.    I also called patient's daughter back and relayed the same message.  She also verbalizes understanding.

## 2020-01-18 LAB — TYPE AND SCREEN
ABO/RH(D): A POS
Antibody Screen: POSITIVE
Unit division: 0

## 2020-01-18 LAB — BPAM RBC
Blood Product Expiration Date: 202110222359
ISSUE DATE / TIME: 202110191414
Unit Type and Rh: 7300

## 2020-01-18 LAB — BPAM PLATELET PHERESIS
Blood Product Expiration Date: 202110222359
ISSUE DATE / TIME: 202110191414
Unit Type and Rh: 7300

## 2020-01-18 LAB — PREPARE PLATELET PHERESIS: Unit division: 0

## 2020-01-19 ENCOUNTER — Other Ambulatory Visit (HOSPITAL_COMMUNITY): Payer: Self-pay | Admitting: *Deleted

## 2020-01-19 ENCOUNTER — Other Ambulatory Visit (HOSPITAL_COMMUNITY): Payer: Medicare Other

## 2020-01-19 ENCOUNTER — Encounter (HOSPITAL_COMMUNITY): Payer: Self-pay

## 2020-01-19 ENCOUNTER — Ambulatory Visit (HOSPITAL_COMMUNITY): Payer: Medicare Other | Admitting: Hematology

## 2020-01-19 ENCOUNTER — Ambulatory Visit (HOSPITAL_COMMUNITY): Payer: Medicare Other

## 2020-01-19 MED ORDER — HYDROMORPHONE HCL 4 MG PO TABS
4.0000 mg | ORAL_TABLET | ORAL | 0 refills | Status: DC | PRN
Start: 2020-01-19 — End: 2020-01-30

## 2020-01-19 NOTE — Progress Notes (Signed)
Attempted to contact patient today to discuss seeing an eye doctor. Unable to reach patient at this time. No voicemail available to leave a message.

## 2020-01-20 ENCOUNTER — Other Ambulatory Visit (HOSPITAL_COMMUNITY): Payer: Self-pay

## 2020-01-20 DIAGNOSIS — C9000 Multiple myeloma not having achieved remission: Secondary | ICD-10-CM

## 2020-01-20 NOTE — Progress Notes (Signed)
Order for referral for Optometry placed per Dr. Delton Coombes

## 2020-01-23 ENCOUNTER — Ambulatory Visit (HOSPITAL_COMMUNITY): Payer: Medicare Other

## 2020-01-23 ENCOUNTER — Other Ambulatory Visit (HOSPITAL_COMMUNITY): Payer: Medicare Other

## 2020-01-23 ENCOUNTER — Ambulatory Visit (HOSPITAL_COMMUNITY): Payer: Medicare Other | Admitting: Hematology

## 2020-01-24 ENCOUNTER — Encounter (HOSPITAL_COMMUNITY): Payer: Self-pay | Admitting: Hematology

## 2020-01-24 ENCOUNTER — Encounter (HOSPITAL_COMMUNITY): Payer: Self-pay | Admitting: *Deleted

## 2020-01-24 NOTE — Progress Notes (Signed)
Patient's daughter called me and asked that I call and check on her father.  She said he isn't feeling well.     I called and got [name] on the telephone.  His voice is very weak. He states that he is extremely tired and weak.  He states that his body is aching all over.  He denies any chest pain or shortness of breath.  He states that he feels like he needs blood.    I have made him an appointment for 830 for port flush w/labs in the morning here at the cancer center.  Patient was advised to come to ER through the night if he has any worsening symptoms.  We will see him tomorrow otherwise.  He verbalizes understanding.   Daughter has been updated.

## 2020-01-25 ENCOUNTER — Encounter (HOSPITAL_COMMUNITY): Payer: Self-pay

## 2020-01-25 ENCOUNTER — Inpatient Hospital Stay (HOSPITAL_COMMUNITY): Payer: Medicare Other

## 2020-01-25 ENCOUNTER — Other Ambulatory Visit (HOSPITAL_COMMUNITY): Payer: Self-pay

## 2020-01-25 ENCOUNTER — Other Ambulatory Visit: Payer: Self-pay

## 2020-01-25 ENCOUNTER — Other Ambulatory Visit (HOSPITAL_COMMUNITY): Payer: Self-pay | Admitting: *Deleted

## 2020-01-25 DIAGNOSIS — C9000 Multiple myeloma not having achieved remission: Secondary | ICD-10-CM

## 2020-01-25 DIAGNOSIS — D649 Anemia, unspecified: Secondary | ICD-10-CM

## 2020-01-25 DIAGNOSIS — D696 Thrombocytopenia, unspecified: Secondary | ICD-10-CM

## 2020-01-25 LAB — COMPREHENSIVE METABOLIC PANEL
ALT: 25 U/L (ref 0–44)
AST: 28 U/L (ref 15–41)
Albumin: 3.1 g/dL — ABNORMAL LOW (ref 3.5–5.0)
Alkaline Phosphatase: 123 U/L (ref 38–126)
Anion gap: 13 (ref 5–15)
BUN: 86 mg/dL — ABNORMAL HIGH (ref 8–23)
CO2: 21 mmol/L — ABNORMAL LOW (ref 22–32)
Calcium: 9.6 mg/dL (ref 8.9–10.3)
Chloride: 104 mmol/L (ref 98–111)
Creatinine, Ser: 5.78 mg/dL — ABNORMAL HIGH (ref 0.61–1.24)
GFR, Estimated: 10 mL/min — ABNORMAL LOW (ref >60)
Glucose, Bld: 91 mg/dL (ref 70–99)
Potassium: 4.4 mmol/L (ref 3.5–5.1)
Sodium: 138 mmol/L (ref 135–145)
Total Bilirubin: 0.9 mg/dL (ref 0.3–1.2)
Total Protein: 6.4 g/dL — ABNORMAL LOW (ref 6.5–8.1)

## 2020-01-25 LAB — CBC WITH DIFFERENTIAL/PLATELET
Band Neutrophils: 2 %
Basophils Absolute: 0 10*3/uL (ref 0.0–0.1)
Basophils Relative: 0 %
Blasts: 4 %
Eosinophils Absolute: 0 10*3/uL (ref 0.0–0.5)
Eosinophils Relative: 0 %
HCT: 20.8 % — ABNORMAL LOW (ref 39.0–52.0)
Hemoglobin: 6.8 g/dL — CL (ref 13.0–17.0)
Lymphocytes Relative: 45 %
Lymphs Abs: 0.9 10*3/uL (ref 0.7–4.0)
MCH: 33 pg (ref 26.0–34.0)
MCHC: 32.7 g/dL (ref 30.0–36.0)
MCV: 101 fL — ABNORMAL HIGH (ref 80.0–100.0)
Metamyelocytes Relative: 3 %
Monocytes Absolute: 0.1 10*3/uL (ref 0.1–1.0)
Monocytes Relative: 6 %
Myelocytes: 1 %
Neutro Abs: 0.8 10*3/uL — ABNORMAL LOW (ref 1.7–7.7)
Neutrophils Relative %: 39 %
Platelets: 10 10*3/uL — CL (ref 150–400)
RBC: 2.06 MIL/uL — ABNORMAL LOW (ref 4.22–5.81)
RDW: 24.3 % — ABNORMAL HIGH (ref 11.5–15.5)
WBC: 1.9 10*3/uL — ABNORMAL LOW (ref 4.0–10.5)
nRBC: 0 % (ref 0.0–0.2)

## 2020-01-25 LAB — BPAM RBC
Blood Product Expiration Date: 202111082359
Unit Type and Rh: 6200

## 2020-01-25 LAB — SAMPLE TO BLOOD BANK

## 2020-01-25 LAB — PREPARE RBC (CROSSMATCH)

## 2020-01-25 MED ORDER — SODIUM CHLORIDE 0.9% IV SOLUTION
250.0000 mL | Freq: Once | INTRAVENOUS | Status: AC
  Administered 2020-01-25: 250 mL via INTRAVENOUS

## 2020-01-25 MED ORDER — ACETAMINOPHEN 325 MG PO TABS
650.0000 mg | ORAL_TABLET | Freq: Once | ORAL | Status: AC
  Administered 2020-01-25: 650 mg via ORAL
  Filled 2020-01-25: qty 2

## 2020-01-25 MED ORDER — DIPHENHYDRAMINE HCL 25 MG PO CAPS
25.0000 mg | ORAL_CAPSULE | Freq: Once | ORAL | Status: AC
  Administered 2020-01-25: 25 mg via ORAL
  Filled 2020-01-25: qty 1

## 2020-01-25 MED ORDER — HEPARIN SOD (PORK) LOCK FLUSH 100 UNIT/ML IV SOLN
500.0000 [IU] | Freq: Every day | INTRAVENOUS | Status: AC | PRN
  Administered 2020-01-25: 500 [IU]

## 2020-01-25 MED ORDER — SODIUM CHLORIDE 0.9% FLUSH
10.0000 mL | INTRAVENOUS | Status: DC | PRN
  Administered 2020-01-25 (×2): 10 mL via INTRAVENOUS

## 2020-01-25 NOTE — Progress Notes (Signed)
[name] tolerated platelet transfusion well without complaints or incident. VSS upon discharge. Pt discharged via wheelchair in satisfactory condition

## 2020-01-25 NOTE — Progress Notes (Signed)
CRITICAL VALUE ALERT  Critical Value:  hgb 6.8; platelets 10  Date & Time Notied:  01/25/20 at 0930  Provider Notified: Delton Coombes  Orders Received/Actions taken: transfuse 1 unit platelets and 2 units PRBC

## 2020-01-25 NOTE — Patient Instructions (Signed)
[place] at [facility] Discharge Instructions  Received 1 unit of platelets today. Follow-up as scheduled   Thank you for choosing [place] at [facility] to provide your oncology and hematology care.  To afford each patient quality time with our provider, please arrive at least 15 minutes before your scheduled appointment time.   If you have a lab appointment with the [place] please come in thru the Main Entrance and check in at the main information desk.  You need to re-schedule your appointment should you arrive 10 or more minutes late.  We strive to give you quality time with our providers, and arriving late affects you and other patients whose appointments are after yours.  Also, if you no show three or more times for appointments you may be dismissed from the clinic at the providers discretion.     Again, thank you for choosing [facility].  Our hope is that these requests will decrease the amount of time that you wait before being seen by our physicians.       _____________________________________________________________  Should you have questions after your visit to [facility], please contact our office at [phone] and follow the prompts.  Our office hours are 8:00 a.m. and 4:30 p.m. Monday - Friday.  Please note that voicemails left after 4:00 p.m. may not be returned until the following business day.  We are closed weekends and major holidays.  You do have access to a nurse 24-7, just call the main number to the clinic [phone] and do not press any options, hold on the line and a nurse will answer the phone.    For prescription refill requests, have your pharmacy contact our office and allow 72 hours.    Due to Covid, you will need to wear a mask upon entering the hospital. If you do not have a mask, a mask will be given to you at the Main Entrance upon arrival. For doctor visits, patients may  have 1 support person age 20 or older with them. For treatment visits, patients can not have anyone with them due to social distancing guidelines and our immunocompromised population.

## 2020-01-26 ENCOUNTER — Encounter (HOSPITAL_COMMUNITY): Payer: Medicare Other

## 2020-01-26 ENCOUNTER — Encounter (HOSPITAL_COMMUNITY): Payer: Self-pay | Admitting: Hematology

## 2020-01-26 LAB — PREPARE PLATELET PHERESIS: Unit division: 0

## 2020-01-26 LAB — BPAM PLATELET PHERESIS
Blood Product Expiration Date: 202110282359
ISSUE DATE / TIME: 202110271042
Unit Type and Rh: 5100

## 2020-01-27 ENCOUNTER — Other Ambulatory Visit (HOSPITAL_COMMUNITY): Payer: Self-pay

## 2020-01-27 ENCOUNTER — Encounter (HOSPITAL_COMMUNITY): Payer: Self-pay

## 2020-01-27 ENCOUNTER — Other Ambulatory Visit: Payer: Self-pay

## 2020-01-27 ENCOUNTER — Inpatient Hospital Stay (HOSPITAL_COMMUNITY)
Admission: EM | Admit: 2020-01-27 | Discharge: 2020-01-30 | DRG: 841 | Disposition: A | Discharge status: Hospice | Payer: Medicare Other | Attending: Internal Medicine | Admitting: Internal Medicine

## 2020-01-27 DIAGNOSIS — N179 Acute kidney failure, unspecified: Secondary | ICD-10-CM | POA: Diagnosis present

## 2020-01-27 DIAGNOSIS — C7951 Secondary malignant neoplasm of bone: Secondary | ICD-10-CM | POA: Diagnosis present

## 2020-01-27 DIAGNOSIS — D696 Thrombocytopenia, unspecified: Secondary | ICD-10-CM | POA: Diagnosis present

## 2020-01-27 DIAGNOSIS — J431 Panlobular emphysema: Secondary | ICD-10-CM | POA: Diagnosis present

## 2020-01-27 DIAGNOSIS — F1120 Opioid dependence, uncomplicated: Secondary | ICD-10-CM | POA: Diagnosis present

## 2020-01-27 DIAGNOSIS — N185 Chronic kidney disease, stage 5: Secondary | ICD-10-CM | POA: Diagnosis present

## 2020-01-27 DIAGNOSIS — Z8249 Family history of ischemic heart disease and other diseases of the circulatory system: Secondary | ICD-10-CM | POA: Diagnosis not present

## 2020-01-27 DIAGNOSIS — Z8349 Family history of other endocrine, nutritional and metabolic diseases: Secondary | ICD-10-CM | POA: Diagnosis not present

## 2020-01-27 DIAGNOSIS — Z87891 Personal history of nicotine dependence: Secondary | ICD-10-CM | POA: Diagnosis not present

## 2020-01-27 DIAGNOSIS — Z515 Encounter for palliative care: Secondary | ICD-10-CM

## 2020-01-27 DIAGNOSIS — Z66 Do not resuscitate: Secondary | ICD-10-CM | POA: Diagnosis present

## 2020-01-27 DIAGNOSIS — D649 Anemia, unspecified: Secondary | ICD-10-CM | POA: Diagnosis present

## 2020-01-27 DIAGNOSIS — R451 Restlessness and agitation: Secondary | ICD-10-CM

## 2020-01-27 DIAGNOSIS — D61818 Other pancytopenia: Secondary | ICD-10-CM | POA: Diagnosis present

## 2020-01-27 DIAGNOSIS — C9000 Multiple myeloma not having achieved remission: Secondary | ICD-10-CM | POA: Diagnosis present

## 2020-01-27 DIAGNOSIS — E875 Hyperkalemia: Secondary | ICD-10-CM | POA: Diagnosis present

## 2020-01-27 DIAGNOSIS — M5416 Radiculopathy, lumbar region: Secondary | ICD-10-CM | POA: Diagnosis present

## 2020-01-27 DIAGNOSIS — Z825 Family history of asthma and other chronic lower respiratory diseases: Secondary | ICD-10-CM

## 2020-01-27 DIAGNOSIS — Z8261 Family history of arthritis: Secondary | ICD-10-CM

## 2020-01-27 DIAGNOSIS — Z981 Arthrodesis status: Secondary | ICD-10-CM | POA: Diagnosis not present

## 2020-01-27 DIAGNOSIS — G893 Neoplasm related pain (acute) (chronic): Secondary | ICD-10-CM | POA: Diagnosis present

## 2020-01-27 DIAGNOSIS — Z7189 Other specified counseling: Secondary | ICD-10-CM

## 2020-01-27 DIAGNOSIS — Z20822 Contact with and (suspected) exposure to covid-19: Secondary | ICD-10-CM | POA: Diagnosis present

## 2020-01-27 DIAGNOSIS — G894 Chronic pain syndrome: Secondary | ICD-10-CM | POA: Diagnosis present

## 2020-01-27 LAB — COMPREHENSIVE METABOLIC PANEL
ALT: 24 U/L (ref 0–44)
AST: 144 U/L — ABNORMAL HIGH (ref 15–41)
Albumin: 3.1 g/dL — ABNORMAL LOW (ref 3.5–5.0)
Alkaline Phosphatase: 158 U/L — ABNORMAL HIGH (ref 38–126)
Anion gap: 18 — ABNORMAL HIGH (ref 5–15)
BUN: 91 mg/dL — ABNORMAL HIGH (ref 8–23)
CO2: 16 mmol/L — ABNORMAL LOW (ref 22–32)
Calcium: 8.5 mg/dL — ABNORMAL LOW (ref 8.9–10.3)
Chloride: 101 mmol/L (ref 98–111)
Creatinine, Ser: 5.59 mg/dL — ABNORMAL HIGH (ref 0.61–1.24)
GFR, Estimated: 11 mL/min — ABNORMAL LOW (ref >60)
Glucose, Bld: 88 mg/dL (ref 70–99)
Potassium: 5.4 mmol/L — ABNORMAL HIGH (ref 3.5–5.1)
Sodium: 135 mmol/L (ref 135–145)
Total Bilirubin: 1.7 mg/dL — ABNORMAL HIGH (ref 0.3–1.2)
Total Protein: 6.1 g/dL — ABNORMAL LOW (ref 6.5–8.1)

## 2020-01-27 LAB — CBC WITH DIFFERENTIAL/PLATELET
Basophils Absolute: 0 10*3/uL (ref 0.0–0.1)
Basophils Relative: 0 %
Blasts: 3 %
Eosinophils Absolute: 0 10*3/uL (ref 0.0–0.5)
Eosinophils Relative: 0 %
HCT: 18.6 % — ABNORMAL LOW (ref 39.0–52.0)
Hemoglobin: 6.1 g/dL — CL (ref 13.0–17.0)
Lymphocytes Relative: 31 %
Lymphs Abs: 0.6 10*3/uL — ABNORMAL LOW (ref 0.7–4.0)
MCH: 33 pg (ref 26.0–34.0)
MCHC: 32.8 g/dL (ref 30.0–36.0)
MCV: 100.5 fL — ABNORMAL HIGH (ref 80.0–100.0)
Metamyelocytes Relative: 6 %
Monocytes Absolute: 0.4 10*3/uL (ref 0.1–1.0)
Monocytes Relative: 19 %
Myelocytes: 2 %
Neutro Abs: 0.7 10*3/uL — ABNORMAL LOW (ref 1.7–7.7)
Neutrophils Relative %: 35 %
Platelets: 12 10*3/uL — CL (ref 150–400)
Promyelocytes Relative: 4 %
RBC: 1.85 MIL/uL — ABNORMAL LOW (ref 4.22–5.81)
RDW: 24.8 % — ABNORMAL HIGH (ref 11.5–15.5)
WBC: 1.9 10*3/uL — ABNORMAL LOW (ref 4.0–10.5)
nRBC: 0 % (ref 0.0–0.2)

## 2020-01-27 LAB — BPAM RBC
Blood Product Expiration Date: 202112032359
Unit Type and Rh: 5100

## 2020-01-27 LAB — TYPE AND SCREEN
ABO/RH(D): A POS
Antibody Screen: POSITIVE
Unit division: 0
Unit division: 0

## 2020-01-27 LAB — PREPARE RBC (CROSSMATCH)

## 2020-01-27 LAB — RESPIRATORY PANEL BY RT PCR (FLU A&B, COVID)
Influenza A by PCR: NEGATIVE
Influenza B by PCR: NEGATIVE
SARS Coronavirus 2 by RT PCR: NEGATIVE

## 2020-01-27 MED ORDER — SODIUM CHLORIDE 0.9% IV SOLUTION: Freq: Once | INTRAVENOUS | Status: DC

## 2020-01-27 MED ORDER — HALOPERIDOL LACTATE 5 MG/ML IJ SOLN: 5.0000 mg | Freq: Four times a day (QID) | INTRAMUSCULAR | Status: DC | PRN

## 2020-01-27 MED ORDER — SODIUM CHLORIDE 0.9% FLUSH
3.0000 mL | Freq: Two times a day (BID) | INTRAVENOUS | Status: DC
  Administered 2020-01-27 – 2020-01-30 (×6): 3 mL via INTRAVENOUS

## 2020-01-27 MED ORDER — ONDANSETRON HCL 4 MG PO TABS: 4.0000 mg | ORAL_TABLET | Freq: Four times a day (QID) | ORAL | Status: DC | PRN

## 2020-01-27 MED ORDER — ACETAMINOPHEN 650 MG RE SUPP: 650.0000 mg | Freq: Four times a day (QID) | RECTAL | Status: DC | PRN

## 2020-01-27 MED ORDER — SODIUM CHLORIDE 0.9 % IV SOLN: 250.0000 mL | INTRAVENOUS | Status: DC | PRN

## 2020-01-27 MED ORDER — ONDANSETRON HCL 4 MG/2ML IJ SOLN: 4.0000 mg | Freq: Four times a day (QID) | INTRAMUSCULAR | Status: DC | PRN

## 2020-01-27 MED ORDER — SODIUM CHLORIDE 0.9% FLUSH: 3.0000 mL | INTRAVENOUS | Status: DC | PRN

## 2020-01-27 MED ORDER — FENTANYL CITRATE (PF) 100 MCG/2ML IJ SOLN
50.0000 ug | INTRAMUSCULAR | Status: DC | PRN
  Administered 2020-01-28 – 2020-01-30 (×8): 50 ug via INTRAVENOUS
  Filled 2020-01-27 (×8): qty 2

## 2020-01-27 MED ORDER — METHOCARBAMOL 500 MG PO TABS
500.0000 mg | ORAL_TABLET | Freq: Three times a day (TID) | ORAL | Status: DC
  Administered 2020-01-27 – 2020-01-29 (×8): 500 mg via ORAL
  Filled 2020-01-27 (×9): qty 1

## 2020-01-27 MED ORDER — BISACODYL 10 MG RE SUPP: 10.0000 mg | Freq: Every day | RECTAL | Status: DC | PRN

## 2020-01-27 MED ORDER — SODIUM CHLORIDE 0.9% FLUSH
3.0000 mL | Freq: Two times a day (BID) | INTRAVENOUS | Status: DC
  Administered 2020-01-28 – 2020-01-30 (×5): 3 mL via INTRAVENOUS

## 2020-01-27 MED ORDER — HYDROMORPHONE HCL 1 MG/ML IJ SOLN
0.5000 mg | INTRAMUSCULAR | Status: DC | PRN
  Administered 2020-01-27: 0.5 mg via INTRAVENOUS
  Filled 2020-01-27: qty 1

## 2020-01-27 MED ORDER — DEXAMETHASONE 4 MG PO TABS: 40.0000 mg | ORAL_TABLET | Freq: Every day | ORAL | Status: DC

## 2020-01-27 MED ORDER — CALCITRIOL 0.25 MCG PO CAPS
0.5000 ug | ORAL_CAPSULE | Freq: Every day | ORAL | Status: DC
  Administered 2020-01-27 – 2020-01-29 (×3): 0.5 ug via ORAL
  Filled 2020-01-27 (×3): qty 2

## 2020-01-27 MED ORDER — HYDROMORPHONE HCL 4 MG PO TABS
4.0000 mg | ORAL_TABLET | ORAL | Status: DC | PRN
  Administered 2020-01-27 – 2020-01-29 (×8): 4 mg via ORAL
  Filled 2020-01-27 (×9): qty 1

## 2020-01-27 MED ORDER — SODIUM ZIRCONIUM CYCLOSILICATE 10 G PO PACK
10.0000 g | PACK | Freq: Once | ORAL | Status: AC
  Administered 2020-01-27: 10 g via ORAL
  Filled 2020-01-27: qty 1

## 2020-01-27 MED ORDER — SODIUM BICARBONATE 650 MG PO TABS
1300.0000 mg | ORAL_TABLET | Freq: Two times a day (BID) | ORAL | Status: DC
  Administered 2020-01-27 – 2020-01-29 (×4): 1300 mg via ORAL
  Filled 2020-01-27 (×4): qty 2

## 2020-01-27 MED ORDER — FUROSEMIDE 10 MG/ML IJ SOLN
40.0000 mg | Freq: Once | INTRAMUSCULAR | Status: AC
  Administered 2020-01-28: 40 mg via INTRAVENOUS
  Filled 2020-01-27: qty 4

## 2020-01-27 MED ORDER — LACTULOSE 10 GM/15ML PO SOLN
30.0000 g | Freq: Every day | ORAL | Status: DC
  Administered 2020-01-27 – 2020-01-29 (×3): 30 g via ORAL
  Filled 2020-01-27 (×3): qty 60

## 2020-01-27 MED ORDER — TRAZODONE HCL 50 MG PO TABS
50.0000 mg | ORAL_TABLET | Freq: Every evening | ORAL | Status: DC | PRN
  Administered 2020-01-28: 50 mg via ORAL
  Filled 2020-01-27: qty 1

## 2020-01-27 MED ORDER — HYDROMORPHONE HCL 1 MG/ML IJ SOLN: 1.0000 mg | INTRAMUSCULAR | Status: DC | PRN

## 2020-01-27 MED ORDER — ACETAMINOPHEN 325 MG PO TABS: 650.0000 mg | ORAL_TABLET | Freq: Four times a day (QID) | ORAL | Status: DC | PRN

## 2020-01-27 MED ORDER — HYDROMORPHONE HCL 1 MG/ML IJ SOLN
1.0000 mg | Freq: Once | INTRAMUSCULAR | Status: AC
  Administered 2020-01-27: 1 mg via INTRAVENOUS
  Filled 2020-01-27: qty 1

## 2020-01-27 MED ORDER — PATIROMER SORBITEX CALCIUM 8.4 G PO PACK
8.4000 g | PACK | ORAL | Status: DC
  Filled 2020-01-27: qty 1

## 2020-01-27 NOTE — ED Triage Notes (Signed)
Pt has a hx of mutiple myeloma. Currently taking chemo but was told to not take it this past week and a half. Reports that often get blood transfusions. Felt very weak at home today.

## 2020-01-27 NOTE — H&P (Addendum)
Patient Demographics:    [name], is a 59 y.o. male  MRN: [id number]   DOB - 1960-05-27  Admit Date - 01/27/2020  Outpatient Primary MD for the patient is Rosita Fire, MD   Assessment & Plan:    Principal Problem:   Pancytopenia ([place]) Active Problems:   AKI (acute kidney injury) ([place])   Goals of care, counseling/discussion   CKD (chronic kidney disease), stage V ([place])   Multiple myeloma not having achieved remission ([place])   Bone metastases ([place])   Panlobular emphysema ([place])   Kappa light chain myeloma ([place])   Anemia   Assessment/Plan:  1)Severe pancytopenia--patient has underlying kappa light chain myeloma -- -He has developed severe pancytopenia -Recent bone marrow biopsy results which showed 97% plasma cells. -He has developed severe Pancytopenias from marrow infiltration from myeloma -platelet count is 12 , --WBC 1.9 and hemoglobin 6.1 -Evaluated on 01/26/20 by myeloma specialist Dr.-Jonathan Percell Miller Lambird--recommended transition to palliative care and hospice patient does not have any good therapeutic options --Patient and family at this time requesting transfusion of PRBCs and platelets -Palliative consult requested but not available at this time  2)Social/Ethics--overall poor prognosis discussed with patient and his daughter [name] as well as his daughter's mother -Patient request DNR/DNI status -Awaiting further conversations with palliative care team  3)CKD V now with hyperkalemia --- prior renal ultrasound without hydronephrosis, avoid nephrotoxic agents -Not a candidate for hemodialysis -Treat hyperkalemia empirically with Lokelma/Veltassa  4)Lumbar Radiculopathy--- -Spinal Stenosis/lumbar radiculopathy status post fusion (initially in 2012 and then again in 07/24/2015)----   MRI L-spine--diffusely abnormal bone marrow signal concerning for myeloproliferative disorder versus infiltrative metastatic disease. Abnormal prevertebraltissue as discussed above at L2-3 -3/16 MR T-spine--no acute findings, neurosurgery, Dr. Melynda Keller nonoperative managementfor radiculopathy  5)COPD- -Stable on room air, continue PRN bronchodilators----Patient states that he quit smoking  6) chronic pain syndrome-please see #4 above, patient also with underlying multiple myeloma -PTA was on oral Dilaudid, pain got worse, we use IV fentanyl for pain control at this time as patient failed oral narcotics at home PTA   Disposition/Need for in-Hospital Stay- patient unable to be discharged at this time due to --severe pancytopenia requiring transfusions, worsening pain syndrome requiring IV pain medication for pain control  Status is: Inpatient  Remains inpatient appropriate because:severe pancytopenia requiring transfusions, worsening pain syndrome requiring IV pain medication for pain control   Dispo: The patient is from: Home              Anticipated d/c is to: Home              Anticipated d/c date is: 2 days              Patient currently is not medically stable to d/c. Barriers: Not Clinically Stable- severe pancytopenia requiring transfusions, worsening pain syndrome requiring IV pain medication for pain control  With History of - Reviewed by me  Past Medical History:  Diagnosis Date  .  Allergy   . Arthritis    neck and back  . Blood transfusion without reported diagnosis   . Bone metastases ([place]) 09/19/2019  . BPH (benign prostatic hyperplasia)   . Cancer [facility]) 2004   testicle  . Chronic kidney disease    kidney stones  . Left lumbar radiculopathy 06/12/2016  . 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 Medical history non-contributory    Pt has scattered thoughts and uncertain of past medical history  . Panlobular emphysema ([place]) 05/28/2016  . Stones, urinary tract   .  Substance abuse ([place])    prescribed oxydcodone- 10 years      Past Surgical History:  Procedure Laterality Date  . BACK SURGERY     x5  . CERVICAL DISC SURGERY     x2  . COLONOSCOPY WITH PROPOFOL N/A 08/11/2016   Dr. Gala Romney: non-bleeding internal hemorrhoids, one 4 mm hyperplastic rectal polyp, diverticulosis in entire examined colon  . POLYPECTOMY  08/11/2016   Procedure: POLYPECTOMY;  Surgeon: [name], MD;  Location: [facility] ENDO SUITE;  Service: Endoscopy;;  colon  . PORTACATH PLACEMENT Left 09/20/2018   Procedure: INSERTION PORT-A-CATH (catheter attached left subclavian);  Surgeon: Virl Cagey, MD;  Location: [facility] ORS;  Service: General;  Laterality: Left;  . testicular cancer  2004  . TONSILLECTOMY      Chief Complaint  Patient presents with  . Weakness      HPI:    [name]  is a 59 y.o. male former smoker with medical history significant forsubstance abuse, Spinal Stenosis/lumbar radiculopathy status post fusion (07/24/2015), COPD, kappa light chain myeloma and chronic kidney disease stage V presenting with increasing weakness and deconditioning - -Patient has become transfusion dependent recently complains of increasing generalized pain - He has developed severe pancytopenia -Recent bone marrow biopsy results which showed 97% plasma cells. -He has developed severe Pancytopenias from marrow infiltration from myeloma -platelet count is 12 , --WBC 1.9 and hemoglobin 6.1 -Evaluated on 01/26/20 by myeloma specialist Dr.-Jonathan Percell Miller Lambird--recommended transition to palliative care and hospice patient does not have any good therapeutic options --Patient and family at this time requesting transfusion of PRBCs and platelets -Palliative consult requested but not available at this time  -No chest pains per se no fevers or chills no bleeding concerns at this time   No Nausea, Vomiting or Diarrhea  -PTA was on oral Dilaudid, pain got worse--- will need IV pain  control for now after failing oral pain medication at home   Review of systems:    In addition to the HPI above,   A full Review of  Systems was done, all other systems reviewed are negative except as noted above in HPI , .    Social History:  Reviewed by me    Social History   Tobacco Use  . Smoking status: Current Every Day Smoker    Packs/day: 1.00    Years: 40.00    Pack years: 40.00    Types: Cigarettes    Start date: 03/31/1974  . Smokeless tobacco: Never Used  Substance Use Topics  . Alcohol use: Yes    Comment: Occasional       Family History :  Reviewed by me    Family History  Problem Relation Age of Onset  . COPD Mother   . Heart disease Mother   . Other Father        Never knew his father.  . Thyroid disease Sister   . Arthritis Sister   .  Heart disease Sister        bypass  . Colon cancer Neg Hx     Home Medications:   Prior to Admission medications   Medication Sig Start Date End Date Taking? Authorizing Provider  aspirin EC 81 MG tablet Take 81 mg by mouth daily.   Yes [provider]  calcitRIOL (ROCALTROL) 0.5 MCG capsule Take 0.5 mcg by mouth daily.  10/29/18  Yes [provider]  dexamethasone (DECADRON) 4 MG tablet Take 10 tablets (40 mg total) by mouth daily. 01/17/20  Yes Derek Jack, MD  HYDROmorphone (DILAUDID) 4 MG tablet Take 1 tablet (4 mg total) by mouth every 3 (three) hours as needed for severe pain. 01/19/20  Yes Derek Jack, MD  Lactulose 20 GM/30ML SOLN Take 36m every 3 hours until you have a bowel movement.  Then take 323monce daily. 01/17/20  Yes KaDerek JackMD  Patiromer Sorbitex Calcium (VELTASSA PO) Take by mouth 2 (two) times a week.    Yes [provider]  prochlorperazine (COMPAZINE) 5 MG tablet Take 1 tablet (5 mg total) by mouth every 6 (six) hours as needed for nausea or vomiting. 04/28/19  Yes KaDerek JackMD  senna (SENOKOT) 8.6 MG tablet Take 1 tablet  by mouth 2 (two) times daily.    Yes [provider]  sodium bicarbonate 650 MG tablet Take 1,300 mg by mouth 2 (two) times daily.   Yes [provider]  torsemide (DEMADEX) 20 MG tablet TAKING 1 TABLET DAILY, AND 2 TABLETS ONLY IF HIS ANKLES ARE REALLY SWOLLEN 10/13/18  Yes KaDerek JackMD  dexamethasone (DECADRON) 4 MG tablet Take 10 tablets (40 mg total) by mouth every 7 (seven) days. Take for 2 days starting the night of chemotherapy. 11/18/19   KaDerek JackMD  Epoetin Alfa (PROCRIT IJ) Inject as directed. Unsure of dosage- gets this once weekly    [provider]  pomalidomide (POMALYST) 3 MG capsule Take 1 capsule (3 mg total) by mouth daily. 12/29/19   KaDerek JackMD  valACYclovir (VALTREX) 500 MG tablet TAKE 1 TABLET BY MOUTH TWICE A DAY 02/07/19   Lockamy, Randi L, NP-C     Allergies:     Allergies  Allergen Reactions  . Oxycodone-Acetaminophen   . Acetaminophen Nausea Only and Other (See Comments)    Elevated liver enzymes  . Cymbalta [Duloxetine Hcl] Swelling    Facial swelling  . Diclofenac Sodium Swelling       . Diclofenac Sodium Swelling  . Imodium [Loperamide] Swelling    facial  . Lyrica [Pregabalin] Swelling  . Morphine Other (See Comments)    Bradycardia   . Vioxx [Rofecoxib] Swelling  . Aleve [Naproxen] Swelling    eye     Physical Exam:   Vitals  Blood pressure (!) 141/88, pulse (!) 103, temperature 99.2 F (37.3 C), temperature source Oral, resp. rate 18, height 6' (1.829 m), SpO2 100 %.  Physical Examination: General appearance - alert, chronically ill and frail appearing,  Mental status - alert, oriented to person, place, and time,  Eyes - sclera anicteric Neck - supple, no JVD elevation , Chest -diminished breath sounds, no wheezing, Heart - S1 and S2 normal, regular, left subclavian Port-A-Cath site noted  abdomen - soft, nontender, nondistended,   Neurological - screening mental status exam  normal, neck supple without rigidity, cranial nerves II through XII intact, DTR's normal and symmetric Extremities - no pedal edema noted, intact peripheral pulses  Skin - warm,  dry     Data Review:    CBC Recent Labs  Lab 01/25/20 0842 01/27/20 1114  WBC 1.9* 1.9*  HGB 6.8* 6.1*  HCT 20.8* 18.6*  PLT 10* 12*  MCV 101.0* 100.5*  MCH 33.0 33.0  MCHC 32.7 32.8  RDW 24.3* 24.8*  LYMPHSABS 0.9 0.6*  MONOABS 0.1 0.4  EOSABS 0.0 0.0  BASOSABS 0.0 0.0   ------------------------------------------------------------------------------------------------------------------  Chemistries  Recent Labs  Lab 01/25/20 0842 01/27/20 1114  NA 138 135  K 4.4 5.4*  CL 104 101  CO2 21* 16*  GLUCOSE 91 88  BUN 86* 91*  CREATININE 5.78* 5.59*  CALCIUM 9.6 8.5*  AST 28 144*  ALT 25 24  ALKPHOS 123 158*  BILITOT 0.9 1.7*   ------------------------------------------------------------------------------------------------------------------ estimated creatinine clearance is 12.7 mL/min (A) (by C-G formula based on SCr of 5.59 mg/dL (H)). ------------------------------------------------------------------------------------------------------------------ No results for input(s): TSH, T4TOTAL, T3FREE, THYROIDAB in the last 72 hours.  Invalid input(s): FREET3   Coagulation profile No results for input(s): INR, PROTIME in the last 168 hours. ------------------------------------------------------------------------------------------------------------------- No results for input(s): DDIMER in the last 72 hours. -------------------------------------------------------------------------------------------------------------------  Cardiac Enzymes No results for input(s): CKMB, TROPONINI, MYOGLOBIN in the last 168 hours.  Invalid input(s): CK ------------------------------------------------------------------------------------------------------------------ No results found for:  BNP   ---------------------------------------------------------------------------------------------------------------  Urinalysis    Component Value Date/Time   COLORURINE STRAW (A) 07/15/2018 2033   APPEARANCEUR CLEAR 07/15/2018 2033   LABSPEC 1.006 07/15/2018 2033   PHURINE 5.0 07/15/2018 2033   GLUCOSEU NEGATIVE 07/15/2018 2033   HGBUR SMALL (A) 07/15/2018 2033   BILIRUBINUR NEGATIVE 07/15/2018 2033   [place] NEGATIVE 07/15/2018 2033   PROTEINUR NEGATIVE 07/15/2018 2033   UROBILINOGEN 0.2 02/09/2008 2042   NITRITE NEGATIVE 07/15/2018 2033   LEUKOCYTESUR NEGATIVE 07/15/2018 2033    ----------------------------------------------------------------------------------------------------------------   Imaging Results:    No results found.  Radiological Exams on Admission: No results found.  DVT Prophylaxis -SCD /low platelets AM Labs Ordered, also please review Full Orders  Family Communication: Admission, patients condition and plan of care including tests being ordered have been discussed with the patient and daughter-[name] who indicate understanding and agree with the plan   Code Status -DNR  Likely DC to  Home   Condition   -overall prognosis is poor  Roxan Hockey M.D on 01/27/2020 at 4:42 PM Go to [email or web address] -  for contact info  [facility] - Office  [phone]

## 2020-01-27 NOTE — ED Provider Notes (Signed)
[facility] EMERGENCY DEPARTMENT Provider Note   CSN: [id number] Arrival date & time: 01/27/20  1046     History Chief Complaint  Patient presents with  . Weakness    [name] is a 59 y.o. male.  HPI 59 year old male presents from home via EMS.  Patient is complaining of pain and also that he missed his appointment yesterday and thus missed his transfusion.  He has multiple myeloma and frequently requires platelet and blood transfusions.  He also has chronic pain, especially his left calf and low back.  These are not new or different but he is in pain today.  He denies acute illness or shortness of breath.  It is unclear who exactly called EMS but he states that someone called him and he did not answer so they came searching for him via 911.   Past Medical History:  Diagnosis Date  . Allergy   . Arthritis    neck and back  . Blood transfusion without reported diagnosis   . Bone metastases ([place]) 09/19/2019  . BPH (benign prostatic hyperplasia)   . 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 Chronic kidney disease    kidney stones  . Left lumbar radiculopathy 06/12/2016  . 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 Medical history non-contributory    Pt has scattered thoughts and uncertain of past medical history  . 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 Stones, urinary tract   . Substance abuse ([place])    prescribed oxydcodone- 10 years    Patient Active Problem List   Diagnosis Date Noted  . Pancytopenia ([place]) 01/27/2020  . Bone metastases ([place]) 09/19/2019  . Acute kidney injury superimposed on CKD ([place])   . Generalized abdominal pain   . CKD (chronic kidney disease), stage V ([place]) 07/15/2018  . Anemia   . Lower GI bleed   . Multiple myeloma not having achieved remission ([place])   . Goals of care, counseling/discussion   . Palliative care encounter   . Kappa light chain myeloma ([place]) 06/16/2018  . Lumbar epidural mass ([place])   . AKI (acute kidney injury) ([place]) 06/12/2018  . PNA  (pneumonia) 06/12/2018  . Hypocalcemia 06/12/2018  . 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 Pulmonary nodules 06/18/2017  . Rectal bleeding 07/09/2016  . Hemorrhoids 07/09/2016  . Chronic left-sided low back pain with left-sided sciatica 06/12/2016  . Left lumbar radiculopathy 06/12/2016  . Aortic atherosclerosis ([place]) 05/28/2016  . 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 Cervical disc disease 05/22/2016  . Kidney stones 05/22/2016  . Tobacco abuse 05/22/2016  . BPH (benign prostatic hyperplasia) 05/22/2016  . H/O: asbestos exposure 05/22/2016  . Substance abuse ([place]) 05/22/2016  . Personality disorder ([place]) 05/22/2016  . Influenza vaccine refused 05/22/2016  . Lumbar stenosis 07/24/2015  . CAROTID ARTERY STENOSIS 04/24/2010  . PVC (premature ventricular contraction) 02/25/2010    Past Surgical History:  Procedure Laterality Date  . BACK SURGERY     x5  . CERVICAL DISC SURGERY     x2  . COLONOSCOPY WITH PROPOFOL N/A 08/11/2016   Dr. Gala Romney: non-bleeding internal hemorrhoids, one 4 mm hyperplastic rectal polyp, diverticulosis in entire examined colon  . POLYPECTOMY  08/11/2016   Procedure: POLYPECTOMY;  Surgeon: [name], MD;  Location: [facility] ENDO SUITE;  Service: Endoscopy;;  colon  . PORTACATH PLACEMENT Left 09/20/2018   Procedure: INSERTION PORT-A-CATH (catheter attached left subclavian);  Surgeon: Virl Cagey, MD;  Location: [facility] ORS;  Service: General;  Laterality: Left;  . testicular cancer  2004  . TONSILLECTOMY         Family History  Problem Relation Age of Onset  . COPD Mother   . Heart disease Mother   . Other Father        Never knew his father.  . Thyroid disease Sister   . Arthritis Sister   . Heart disease Sister        bypass  . Colon cancer Neg Hx     Social History   Tobacco Use  . Smoking status: Current Every Day Smoker    Packs/day: 1.00    Years: 40.00    Pack years: 40.00    Types: Cigarettes    Start date: 03/31/1974  . Smokeless  tobacco: Never Used  Vaping Use  . Vaping Use: Never used  Substance Use Topics  . Alcohol use: Yes    Comment: Occasional  . Drug use: No    Types: Cocaine    Comment: Remote hx of cocaine, quit 1989    Home Medications Prior to Admission medications   Medication Sig Start Date End Date Taking? Authorizing Provider  aspirin EC 81 MG tablet Take 81 mg by mouth daily.   Yes [provider]  calcitRIOL (ROCALTROL) 0.5 MCG capsule Take 0.5 mcg by mouth daily.  10/29/18  Yes [provider]  dexamethasone (DECADRON) 4 MG tablet Take 10 tablets (40 mg total) by mouth daily. 01/17/20  Yes Derek Jack, MD  HYDROmorphone (DILAUDID) 4 MG tablet Take 1 tablet (4 mg total) by mouth every 3 (three) hours as needed for severe pain. 01/19/20  Yes Derek Jack, MD  Lactulose 20 GM/30ML SOLN Take 18m every 3 hours until you have a bowel movement.  Then take 369monce daily. 01/17/20  Yes KaDerek JackMD  Patiromer Sorbitex Calcium (VELTASSA PO) Take by mouth 2 (two) times a week.    Yes [provider]  prochlorperazine (COMPAZINE) 5 MG tablet Take 1 tablet (5 mg total) by mouth every 6 (six) hours as needed for nausea or vomiting. 04/28/19  Yes KaDerek JackMD  senna (SENOKOT) 8.6 MG tablet Take 1 tablet by mouth 2 (two) times daily.    Yes [provider]  sodium bicarbonate 650 MG tablet Take 1,300 mg by mouth 2 (two) times daily.   Yes [provider]  torsemide (DEMADEX) 20 MG tablet TAKING 1 TABLET DAILY, AND 2 TABLETS ONLY IF HIS ANKLES ARE REALLY SWOLLEN 10/13/18  Yes KaDerek JackMD  dexamethasone (DECADRON) 4 MG tablet Take 10 tablets (40 mg total) by mouth every 7 (seven) days. Take for 2 days starting the night of chemotherapy. 11/18/19   KaDerek JackMD  Epoetin Alfa (PROCRIT IJ) Inject as directed. Unsure of dosage- gets this once weekly    [provider]  pomalidomide (POMALYST) 3 MG  capsule Take 1 capsule (3 mg total) by mouth daily. 12/29/19   KaDerek JackMD  valACYclovir (VALTREX) 500 MG tablet TAKE 1 TABLET BY MOUTH TWICE A DAY 02/07/19   Lockamy, Randi L, NP-C    Allergies    Oxycodone-acetaminophen, Acetaminophen, Cymbalta [duloxetine hcl], Diclofenac sodium, Diclofenac sodium, Imodium [loperamide], Lyrica [pregabalin], Morphine, Vioxx [rofecoxib], and Aleve [naproxen]  Review of Systems   Review of Systems  Constitutional: Negative for fever.  Respiratory: Negative for shortness of breath.   Cardiovascular: Negative for chest pain.  Musculoskeletal: Positive for back pain and myalgias.  All other systems reviewed and are negative.   Physical Exam Updated Vital Signs  BP (!) 148/78   Pulse (!) 105   Temp 98.3 F (36.8 C) (Oral)   Resp 15   Ht 6' (1.829 m)   SpO2 97%   BMI 19.32 kg/m   Physical Exam Vitals and nursing note reviewed.  Constitutional:      Appearance: He is well-developed.     Comments: Appears chronically ill  HENT:     Head: Normocephalic and atraumatic.     Right Ear: External ear normal.     Left Ear: External ear normal.     Nose: Nose normal.  Eyes:     General:        Right eye: No discharge.        Left eye: No discharge.  Cardiovascular:     Rate and Rhythm: Regular rhythm. Tachycardia present.     Heart sounds: Normal heart sounds.  Pulmonary:     Effort: Pulmonary effort is normal.     Breath sounds: Normal breath sounds.  Abdominal:     Palpations: Abdomen is soft.     Tenderness: There is no abdominal tenderness.  Musculoskeletal:     Cervical back: Neck supple.     Lumbar back: Tenderness present.     Comments: No significant calf tenderness or swelling in left leg  Skin:    General: Skin is warm and dry.  Neurological:     Mental Status: He is alert.  Psychiatric:        Mood and Affect: Mood is not anxious.     ED Results / Procedures / Treatments   Labs (all labs ordered are listed, but  only abnormal results are displayed) Labs Reviewed  COMPREHENSIVE METABOLIC PANEL - Abnormal; Notable for the following components:      Result Value   Potassium 5.4 (*)    CO2 16 (*)    BUN 91 (*)    Creatinine, Ser 5.59 (*)    Calcium 8.5 (*)    Total Protein 6.1 (*)    Albumin 3.1 (*)    AST 144 (*)    Alkaline Phosphatase 158 (*)    Total Bilirubin 1.7 (*)    GFR, Estimated 11 (*)    Anion gap 18 (*)    All other components within normal limits  CBC WITH DIFFERENTIAL/PLATELET - Abnormal; Notable for the following components:   WBC 1.9 (*)    RBC 1.85 (*)    Hemoglobin 6.1 (*)    HCT 18.6 (*)    MCV 100.5 (*)    RDW 24.8 (*)    Platelets 12 (*)    Neutro Abs 0.7 (*)    Lymphs Abs 0.6 (*)    All other components within normal limits  RESPIRATORY PANEL BY RT PCR (FLU A&B, COVID)  TYPE AND SCREEN  PREPARE RBC (CROSSMATCH)  PREPARE PLATELET PHERESIS    EKG None  Radiology No results found.  Procedures .Critical Care Performed by: Sherwood Gambler, MD Authorized by: Sherwood Gambler, MD   Critical care provider statement:    Critical care time (minutes):  35   Critical care time was exclusive of:  Separately billable procedures and treating other patients   Critical care was necessary to treat or prevent imminent or life-threatening deterioration of the following conditions:  Circulatory failure   Critical care was time spent personally by me on the following activities:  Discussions with consultants, evaluation of patient's response to treatment, examination of patient, ordering and performing treatments and interventions, ordering and review of laboratory  studies, ordering and review of radiographic studies, pulse oximetry, re-evaluation of patient's condition, obtaining history from patient or surrogate and review of old charts   (including critical care time)  Medications Ordered in ED Medications  0.9 %  sodium chloride infusion (Manually program via Guardrails  IV Fluids) (has no administration in time range)  HYDROmorphone (DILAUDID) injection 1 mg (1 mg Intravenous Given 01/27/20 1141)    ED Course  I have reviewed the triage vital signs and the nursing notes.  Pertinent labs & imaging results that were available during my care of the patient were reviewed by me and considered in my medical decision making (see chart for details).    MDM Rules/Calculators/A&P                          Patient presents with recurrent anemia and thrombocytopenia.  He also has chronic pain but no new complaints.  After discussion with his oncologist, Dr. Delton Coombes, it seems that the patient is basically failure to thrive and cannot take care of himself at home.  He recommends 2 units of packed red blood cells as well as 1 of platelets and admission to the hospital service.  He will consult to help with management.  Dr. Denton Brick to admit.  Of note, because of issues with his blood, they are unable to get a perfect crossmatch he is getting blood that is not a perfect fit but is as close as they can get and similar to what he got a couple days ago. Final Clinical Impression(s) / ED Diagnoses Final diagnoses:  Symptomatic anemia  Thrombocytopenia ([place])    Rx / DC Orders ED Discharge Orders    None       Sherwood Gambler, MD 01/27/20 1539

## 2020-01-27 NOTE — Progress Notes (Signed)
Call received from Dr. Aris Lot with [facility]. Dr. Aris Lot tells me that he saw the patient yesterday and recommended palliative care, stating that he was not a candidate for any further treatments in his opinion. Dr. Aris Lot also talked with the patient's daughter about his recommendation. Dr. Aris Lot states that the patient was very "weak and frail" when they met yesterday. Dr. Aris Lot asks about pain medication, stating that he was unclear in if the patient is taking as prescribed, stating that he is "concerned about his mental state," following their conversation. Dr. Aris Lot has placed the referral to palliative care. Dr. Delton Coombes made aware of this conversation.

## 2020-01-28 DIAGNOSIS — N185 Chronic kidney disease, stage 5: Secondary | ICD-10-CM

## 2020-01-28 DIAGNOSIS — C7951 Secondary malignant neoplasm of bone: Secondary | ICD-10-CM

## 2020-01-28 DIAGNOSIS — N179 Acute kidney failure, unspecified: Secondary | ICD-10-CM

## 2020-01-28 LAB — BASIC METABOLIC PANEL
Anion gap: 18 — ABNORMAL HIGH (ref 5–15)
BUN: 102 mg/dL — ABNORMAL HIGH (ref 8–23)
CO2: 19 mmol/L — ABNORMAL LOW (ref 22–32)
Calcium: 8.4 mg/dL — ABNORMAL LOW (ref 8.9–10.3)
Chloride: 100 mmol/L (ref 98–111)
Creatinine, Ser: 5.64 mg/dL — ABNORMAL HIGH (ref 0.61–1.24)
GFR, Estimated: 11 mL/min — ABNORMAL LOW (ref >60)
Glucose, Bld: 135 mg/dL — ABNORMAL HIGH (ref 70–99)
Potassium: 4.4 mmol/L (ref 3.5–5.1)
Sodium: 137 mmol/L (ref 135–145)

## 2020-01-28 LAB — BPAM PLATELET PHERESIS
Blood Product Expiration Date: 202111012359
ISSUE DATE / TIME: 202110292035
Unit Type and Rh: 5100

## 2020-01-28 LAB — CBC
HCT: 25.6 % — ABNORMAL LOW (ref 39.0–52.0)
Hemoglobin: 8.8 g/dL — ABNORMAL LOW (ref 13.0–17.0)
MCH: 32 pg (ref 26.0–34.0)
MCHC: 34.4 g/dL (ref 30.0–36.0)
MCV: 93.1 fL (ref 80.0–100.0)
Platelets: 23 10*3/uL — CL (ref 150–400)
RBC: 2.75 MIL/uL — ABNORMAL LOW (ref 4.22–5.81)
RDW: 22.5 % — ABNORMAL HIGH (ref 11.5–15.5)
WBC: 1.3 10*3/uL — CL (ref 4.0–10.5)
nRBC: 0 % (ref 0.0–0.2)

## 2020-01-28 LAB — PREPARE PLATELET PHERESIS: Unit division: 0

## 2020-01-28 MED ORDER — CHLORHEXIDINE GLUCONATE CLOTH 2 % EX PADS
6.0000 | MEDICATED_PAD | Freq: Every day | CUTANEOUS | Status: DC
  Administered 2020-01-28 – 2020-01-30 (×3): 6 via TOPICAL

## 2020-01-28 NOTE — Progress Notes (Signed)
PROGRESS NOTE    [name]  NGE:[id number] DOB: March 12, 1961 DOA: 01/27/2020 PCP: Rosita Fire, MD    Brief Narrative:  [name]  is a 59 y.o. male former smoker with medical history significant forsubstance abuse,Spinal Stenosis/lumbar radiculopathy status post fusion(07/24/2015), COPD, kappa light chain myeloma and chronic kidney disease stage V presenting with increasing weakness and deconditioning - -Patient has become transfusion dependent recently complains of increasing generalized pain - He has developed severe pancytopenia -Recent bone marrow biopsy results which showed 97% plasma cells. -He has developed severe Pancytopenias from marrow infiltration from myeloma -platelet count is 12 , --WBC 1.9 and hemoglobin 6.1 -Evaluated on 01/26/20 by myeloma specialist Dr.-Jonathan Percell Miller Lambird--recommended transition to palliative care and hospice patient does not have any good therapeutic options --Patient and family at this time requesting transfusion of PRBCs and platelets -Palliative consult requested but not available at this time  -No chest pains per se no fevers or chills no bleeding concerns at this time   No Nausea, Vomiting or Diarrhea  -PTA was on oral Dilaudid, pain got worse--- will need IV pain control for now after failing oral pain medication at home   Assessment & Plan:   Principal Problem:   Pancytopenia ([place]) Active Problems:   Panlobular emphysema ([place])   AKI (acute kidney injury) ([place])   Kappa light chain myeloma ([place])   Goals of care, counseling/discussion   CKD (chronic kidney disease), stage V ([place])   Anemia   Multiple myeloma not having achieved remission ([place])   Bone metastases ([place])   Severe pancytopenia -Secondary to myeloma -Platelet count has improved with transfusion of platelets -Hemoglobin is also better after PRBC transfusion -Continue to monitor  Kappa light chain myeloma -Patient has been treated by oncology  and has limited treatment options at this point -He had seen specialist at [facility] -I do not have records available for review -Patient did not specify recommendations from specialist -We will follow up with patient's family  Chronic kidney disease stage V -Creatinine is elevated, but appears stable -He is not a candidate for hemodialysis  Lumbar radiculopathy -Per neurosurgery, nonoperative management recommended -Continue pain management  COPD -Stable on room air, continue as needed bronchodilators  Chronic pain syndrome -Secondary to underlying malignancy -Continue pain management and adjust as needed  Goals of care -Patient is DNR -Family requested further conversation with palliative care   DVT prophylaxis: SCDs Start: 01/27/20 1634 Place TED hose Start: 01/27/20 1634  Code Status: DNR Family Communication: Discussed with patient Disposition Plan: Status is: Inpatient  Remains inpatient appropriate because:Inpatient level of care appropriate due to severity of illness   Dispo: The patient is from: Home              Anticipated d/c is to: Home              Anticipated d/c date is: 2 days              Patient currently is not medically stable to d/c.    Consultants:     Procedures:     Antimicrobials:       Subjective: Continues to have pain, mostly in his legs bilaterally  Objective: Vitals:   01/28/20 0549 01/28/20 0633 01/28/20 0910 01/28/20 1404  BP: 115/72 121/73 (!) 136/95 139/80  Pulse: (!) 103 97 (!) 102 (!) 101  Resp: _0 Temp: 98.2 F (36.8 C) 98.2 F (36.8 C) 98.5 F (36.9 C) 98.9 F (37.2 C)  TempSrc: Oral Oral Oral   SpO2: 99% 100% 100% 97%  Height:        Intake/Output Summary (Last 24 hours) at 01/28/2020 2046 Last data filed at 01/28/2020 0615 Gross per 24 hour  Intake 945 ml  Output --  Net 945 ml   There were no vitals filed for this visit.  Examination:  General exam: Appears calm and  comfortable  Respiratory system: Clear to auscultation. Respiratory effort normal. Cardiovascular system: S1 & S2 heard, RRR. No JVD, murmurs, rubs, gallops or clicks. No pedal edema. Gastrointestinal system: Abdomen is nondistended, soft and nontender. No organomegaly or masses felt. Normal bowel sounds heard. Central nervous system: Alert and oriented. No focal neurological deficits. Extremities: Symmetric 5 x 5 power. Skin: No rashes, lesions or ulcers Psychiatry: Judgement and insight appear normal. Mood & affect appropriate.     Data Reviewed: I have personally reviewed following labs and imaging studies  CBC: Recent Labs  Lab 01/25/20 0842 01/27/20 1114 01/28/20 1011  WBC 1.9* 1.9* 1.3*  NEUTROABS 0.8* 0.7*  --   HGB 6.8* 6.1* 8.8*  HCT 20.8* 18.6* 25.6*  MCV 101.0* 100.5* 93.1  PLT 10* 12* 23*   Basic Metabolic Panel: Recent Labs  Lab 01/25/20 0842 01/27/20 1114 01/28/20 1011  NA 138 135 137  K 4.4 5.4* 4.4  CL 104 101 100  CO2 21* 16* 19*  GLUCOSE 91 88 135*  BUN 86* 91* 102*  CREATININE 5.78* 5.59* 5.64*  CALCIUM 9.6 8.5* 8.4*   GFR: Estimated Creatinine Clearance: 12.6 mL/min (A) (by C-G formula based on SCr of 5.64 mg/dL (H)). Liver Function Tests: Recent Labs  Lab 01/25/20 0842 01/27/20 1114  AST 28 144*  ALT 25 24  ALKPHOS 123 158*  BILITOT 0.9 1.7*  PROT 6.4* 6.1*  ALBUMIN 3.1* 3.1*   No results for input(s): LIPASE, AMYLASE in the last 168 hours. No results for input(s): AMMONIA in the last 168 hours. Coagulation Profile: No results for input(s): INR, PROTIME in the last 168 hours. Cardiac Enzymes: No results for input(s): CKTOTAL, CKMB, CKMBINDEX, TROPONINI in the last 168 hours. BNP (last 3 results) No results for input(s): PROBNP in the last 8760 hours. HbA1C: No results for input(s): HGBA1C in the last 72 hours. CBG: No results for input(s): GLUCAP in the last 168 hours. Lipid Profile: No results for input(s): CHOL, HDL, LDLCALC,  TRIG, CHOLHDL, LDLDIRECT in the last 72 hours. Thyroid Function Tests: No results for input(s): TSH, T4TOTAL, FREET4, T3FREE, THYROIDAB in the last 72 hours. Anemia Panel: No results for input(s): VITAMINB12, FOLATE, FERRITIN, TIBC, IRON, RETICCTPCT in the last 72 hours. Sepsis Labs: No results for input(s): PROCALCITON, LATICACIDVEN in the last 168 hours.  Recent Results (from the past 240 hour(s))  Respiratory Panel by RT PCR (Flu A&B, Covid) - Nasopharyngeal Swab     Status: None   Collection Time: 01/27/20  1:41 PM   Specimen: Nasopharyngeal Swab  Result Value Ref Range Status   SARS Coronavirus 2 by RT PCR NEGATIVE NEGATIVE Final    Comment: (NOTE) SARS-CoV-2 target nucleic acids are NOT DETECTED.  The SARS-CoV-2 RNA is generally detectable in upper respiratoy specimens during the acute phase of infection. The lowest concentration of SARS-CoV-2 viral copies this assay can detect is 131 copies/mL. A negative result does not preclude SARS-Cov-2 infection and should not be used as the sole basis for treatment or other patient management decisions. A negative result may occur with  improper specimen collection/handling, submission of specimen  other than nasopharyngeal swab, presence of viral mutation(s) within the areas targeted by this assay, and inadequate number of viral copies (<131 copies/mL). A negative result must be combined with clinical observations, patient history, and epidemiological information. The expected result is Negative.  Fact Sheet for Patients:  PinkCheek.be  Fact Sheet for Healthcare Providers:  GravelBags.it  This test is no t yet approved or cleared by the [place] FDA and  has been authorized for detection and/or diagnosis of SARS-CoV-2 by FDA under an Emergency Use Authorization (EUA). This EUA will remain  in effect (meaning this test can be used) for the duration of the COVID-19  declaration under Section 564(b)(1) of the Act, 21 U.S.C. section 360bbb-3(b)(1), unless the authorization is terminated or revoked sooner.     Influenza A by PCR NEGATIVE NEGATIVE Final   Influenza B by PCR NEGATIVE NEGATIVE Final    Comment: (NOTE) The Xpert Xpress SARS-CoV-2/FLU/RSV assay is intended as an aid in  the diagnosis of influenza from Nasopharyngeal swab specimens and  should not be used as a sole basis for treatment. Nasal washings and  aspirates are unacceptable for Xpert Xpress SARS-CoV-2/FLU/RSV  testing.  Fact Sheet for Patients: PinkCheek.be  Fact Sheet for Healthcare Providers: GravelBags.it  This test is not yet approved or cleared by the [place] FDA and  has been authorized for detection and/or diagnosis of SARS-CoV-2 by  FDA under an Emergency Use Authorization (EUA). This EUA will remain  in effect (meaning this test can be used) for the duration of the  Covid-19 declaration under Section 564(b)(1) of the Act, 21  U.S.C. section 360bbb-3(b)(1), unless the authorization is  terminated or revoked. Performed at [facility], [address] [place], [place] [address]          Radiology Studies: No results found.      Scheduled Meds: . sodium chloride   Intravenous Once  . calcitRIOL  0.5 mcg Oral Daily  . Chlorhexidine Gluconate Cloth  6 each Topical Daily  . lactulose  30 g Oral Daily  . methocarbamol  500 mg Oral TID  . [START ON 02/14/2020] patiromer  8.4 g Oral Once per day on Mon Thu  . sodium bicarbonate  1,300 mg Oral BID  . sodium chloride flush  3 mL Intravenous Q12H  . sodium chloride flush  3 mL Intravenous Q12H   Continuous Infusions: . sodium chloride       LOS: 1 day    Time spent: 72mns    JKathie Dike MD [facility]   If 7PM-7AM, please contact night-coverage [email or web address]  01/28/2020, 8:46 PM

## 2020-01-29 LAB — TYPE AND SCREEN
ABO/RH(D): A POS
Antibody Screen: POSITIVE
Unit division: 0
Unit division: 0

## 2020-01-29 LAB — CBC
HCT: 23.6 % — ABNORMAL LOW (ref 39.0–52.0)
Hemoglobin: 8 g/dL — ABNORMAL LOW (ref 13.0–17.0)
MCH: 31.9 pg (ref 26.0–34.0)
MCHC: 33.9 g/dL (ref 30.0–36.0)
MCV: 94 fL (ref 80.0–100.0)
Platelets: 14 10*3/uL — CL (ref 150–400)
RBC: 2.51 MIL/uL — ABNORMAL LOW (ref 4.22–5.81)
RDW: 22.7 % — ABNORMAL HIGH (ref 11.5–15.5)
WBC: 1.4 10*3/uL — CL (ref 4.0–10.5)
nRBC: 0 % (ref 0.0–0.2)

## 2020-01-29 LAB — BPAM RBC
Blood Product Expiration Date: 202111082359
Blood Product Expiration Date: 202112032359
ISSUE DATE / TIME: 202110300148
ISSUE DATE / TIME: 202110300555
Unit Type and Rh: 5100
Unit Type and Rh: 6200

## 2020-01-29 LAB — BASIC METABOLIC PANEL
Anion gap: 14 (ref 5–15)
BUN: 104 mg/dL — ABNORMAL HIGH (ref 8–23)
CO2: 20 mmol/L — ABNORMAL LOW (ref 22–32)
Calcium: 7.9 mg/dL — ABNORMAL LOW (ref 8.9–10.3)
Chloride: 101 mmol/L (ref 98–111)
Creatinine, Ser: 5.24 mg/dL — ABNORMAL HIGH (ref 0.61–1.24)
GFR, Estimated: 12 mL/min — ABNORMAL LOW (ref >60)
Glucose, Bld: 95 mg/dL (ref 70–99)
Potassium: 4.2 mmol/L (ref 3.5–5.1)
Sodium: 135 mmol/L (ref 135–145)

## 2020-01-29 MED ORDER — LACTULOSE 10 GM/15ML PO SOLN: 30.0000 g | Freq: Every day | ORAL | Status: DC | PRN

## 2020-01-29 MED ORDER — LORAZEPAM 2 MG/ML IJ SOLN
1.0000 mg | INTRAMUSCULAR | Status: DC | PRN
  Administered 2020-01-29 – 2020-01-30 (×3): 1 mg via INTRAVENOUS
  Filled 2020-01-29 (×3): qty 1

## 2020-01-29 NOTE — Progress Notes (Signed)
CRITICAL VALUE ALERT  Critical Value:  WBC 1.4, Platelets 14  Date & Time Notied:  01/29/2020 @ 10:06  Provider Notified: Dr. Roderic Palau  Orders Received/Actions taken: Continuing to monitor

## 2020-01-29 NOTE — Progress Notes (Signed)
Bed alarm activated, pt stated he was tangled up in the heart monitor and didn't know what he was doing. Adjusted pt in bed and added warm blanket for comfort. Pt stated multiple time that he "was tired". Emotional support provided, pt expressed satisfaction with laying flat, and pain was ok. Will follow up

## 2020-01-29 NOTE — Progress Notes (Signed)
PROGRESS NOTE    Jc Veron  KNL:[id number] DOB: [birth year] DOA: 01/27/2020 PCP: Rosita Fire, MD    Brief Narrative:  [name]  is a 59 y.o. male former smoker with medical history significant forsubstance abuse,Spinal Stenosis/lumbar radiculopathy status post fusion(07/24/2015), COPD, kappa light chain myeloma and chronic kidney disease stage V presenting with increasing weakness and deconditioning - -Patient has become transfusion dependent recently complains of increasing generalized pain - He has developed severe pancytopenia -Recent bone marrow biopsy results which showed 97% plasma cells. -He has developed severe Pancytopenias from marrow infiltration from myeloma -platelet count is 12 , --WBC 1.9 and hemoglobin 6.1 -Evaluated on 01/26/20 by myeloma specialist Dr.-Jonathan Percell Miller Lambird--recommended transition to palliative care and hospice patient does not have any good therapeutic options --Patient and family at this time requesting transfusion of PRBCs and platelets -Palliative consult requested but not available at this time  -No chest pains per se no fevers or chills no bleeding concerns at this time   No Nausea, Vomiting or Diarrhea  -PTA was on oral Dilaudid, pain got worse--- will need IV pain control for now after failing oral pain medication at home   Assessment & Plan:   Principal Problem:   Pancytopenia ([place]) Active Problems:   Panlobular emphysema ([place])   AKI (acute kidney injury) ([place])   Kappa light chain myeloma ([place])   Goals of care, counseling/discussion   CKD (chronic kidney disease), stage V ([place])   Anemia   Multiple myeloma not having achieved remission ([place])   Bone metastases ([place])   Severe pancytopenia -Secondary to myeloma -Platelet count had initially improved with transfusion of platelets, but has since trended down -Hemoglobin also initially improved after PRBC transfusion, but has since began to trend  down   Kappa light chain myeloma -Patient has been treated by oncology and has limited treatment options at this point -He had seen specialist at [facility] who did not feel he was a candidate for further treatment and recommended palliative care -Updated Dr. Delton Coombes on plan  Chronic kidney disease stage V -Creatinine is elevated, but appears stable -He is not a candidate for hemodialysis  Lumbar radiculopathy -Per neurosurgery, nonoperative management recommended -Continue pain management  COPD -Stable on room air, continue as needed bronchodilators  Chronic pain syndrome -Secondary to underlying malignancy -Continue pain management and adjust as needed  Goals of care -Patient is DNR -After long discussion with family, they have elected to transition the patient to comfort care and are requesting placement in residential hospice   DVT prophylaxis: SCDs Start: 01/27/20 1634 Place TED hose Start: 01/27/20 1634  Code Status: DNR Family Communication: Discussed with patient Disposition Plan: Status is: Inpatient  Remains inpatient appropriate because:Inpatient level of care appropriate due to severity of illness   Dispo: The patient is from: Home              Anticipated d/c is to: Residential hospice              Anticipated d/c date is: 2 days              Patient currently is not medically stable to d/c.    Consultants:     Procedures:     Antimicrobials:       Subjective: Recently received pain medicine and is drowsy.  Feels that pain is mostly controlled right now.  Objective: Vitals:   01/28/20 1404 01/28/20 2133 01/29/20 0606 01/29/20 1239  BP: 139/80 132/64 131/65 116/60  Pulse: Marland Kitchen)  101 (!) 108 (!) 101 (!) 105  Resp: 20 18 17 20   Temp: 98.9 F (37.2 C) 99.2 F (37.3 C) 99.2 F (37.3 C) 97.6 F (36.4 C)  TempSrc:  Oral Oral Oral  SpO2: 97% 99% 100% 100%  Height:        Intake/Output Summary (Last 24 hours) at 01/29/2020  1900 Last data filed at 01/29/2020 0252 Gross per 24 hour  Intake --  Output 600 ml  Net -600 ml   There were no vitals filed for this visit.  Examination:  General exam: Somnolent, no distress Respiratory system: Clear to auscultation. Respiratory effort normal. Cardiovascular system: Tachycardic. No murmurs, rubs, gallops. Gastrointestinal system: Abdomen is nondistended, soft and nontender. No organomegaly or masses felt. Normal bowel sounds heard. Central nervous system:  No focal neurological deficits. Extremities: No C/C/E, +pedal pulses Skin: No rashes, lesions or ulcers Psychiatry: Somnolent  Data Reviewed: I have personally reviewed following labs and imaging studies  CBC: Recent Labs  Lab 01/25/20 0842 01/27/20 1114 01/28/20 1011 01/29/20 0857  WBC 1.9* 1.9* 1.3* 1.4*  NEUTROABS 0.8* 0.7*  --   --   HGB 6.8* 6.1* 8.8* 8.0*  HCT 20.8* 18.6* 25.6* 23.6*  MCV 101.0* 100.5* 93.1 94.0  PLT 10* 12* 23* 14*   Basic Metabolic Panel: Recent Labs  Lab 01/25/20 0842 01/27/20 1114 01/28/20 1011 01/29/20 0857  NA 138 135 137 135  K 4.4 5.4* 4.4 4.2  CL 104 101 100 101  CO2 21* 16* 19* 20*  GLUCOSE 91 88 135* 95  BUN 86* 91* 102* 104*  CREATININE 5.78* 5.59* 5.64* 5.24*  CALCIUM 9.6 8.5* 8.4* 7.9*   GFR: Estimated Creatinine Clearance: 13.5 mL/min (A) (by C-G formula based on SCr of 5.24 mg/dL (H)). Liver Function Tests: Recent Labs  Lab 01/25/20 0842 01/27/20 1114  AST 28 144*  ALT 25 24  ALKPHOS 123 158*  BILITOT 0.9 1.7*  PROT 6.4* 6.1*  ALBUMIN 3.1* 3.1*   No results for input(s): LIPASE, AMYLASE in the last 168 hours. No results for input(s): AMMONIA in the last 168 hours. Coagulation Profile: No results for input(s): INR, PROTIME in the last 168 hours. Cardiac Enzymes: No results for input(s): CKTOTAL, CKMB, CKMBINDEX, TROPONINI in the last 168 hours. BNP (last 3 results) No results for input(s): PROBNP in the last 8760 hours. HbA1C: No  results for input(s): HGBA1C in the last 72 hours. CBG: No results for input(s): GLUCAP in the last 168 hours. Lipid Profile: No results for input(s): CHOL, HDL, LDLCALC, TRIG, CHOLHDL, LDLDIRECT in the last 72 hours. Thyroid Function Tests: No results for input(s): TSH, T4TOTAL, FREET4, T3FREE, THYROIDAB in the last 72 hours. Anemia Panel: No results for input(s): VITAMINB12, FOLATE, FERRITIN, TIBC, IRON, RETICCTPCT in the last 72 hours. Sepsis Labs: No results for input(s): PROCALCITON, LATICACIDVEN in the last 168 hours.  Recent Results (from the past 240 hour(s))  Respiratory Panel by RT PCR (Flu A&B, Covid) - Nasopharyngeal Swab     Status: None   Collection Time: 01/27/20  1:41 PM   Specimen: Nasopharyngeal Swab  Result Value Ref Range Status   SARS Coronavirus 2 by RT PCR NEGATIVE NEGATIVE Final    Comment: (NOTE) SARS-CoV-2 target nucleic acids are NOT DETECTED.  The SARS-CoV-2 RNA is generally detectable in upper respiratoy specimens during the acute phase of infection. The lowest concentration of SARS-CoV-2 viral copies this assay can detect is 131 copies/mL. A negative result does not preclude SARS-Cov-2 infection and should not  be used as the sole basis for treatment or other patient management decisions. A negative result may occur with  improper specimen collection/handling, submission of specimen other than nasopharyngeal swab, presence of viral mutation(s) within the areas targeted by this assay, and inadequate number of viral copies (<131 copies/mL). A negative result must be combined with clinical observations, patient history, and epidemiological information. The expected result is Negative.  Fact Sheet for Patients:  PinkCheek.be  Fact Sheet for Healthcare Providers:  GravelBags.it  This test is no t yet approved or cleared by the [place] FDA and  has been authorized for detection and/or  diagnosis of SARS-CoV-2 by FDA under an Emergency Use Authorization (EUA). This EUA will remain  in effect (meaning this test can be used) for the duration of the COVID-19 declaration under Section 564(b)(1) of the Act, 21 U.S.C. section 360bbb-3(b)(1), unless the authorization is terminated or revoked sooner.     Influenza A by PCR NEGATIVE NEGATIVE Final   Influenza B by PCR NEGATIVE NEGATIVE Final    Comment: (NOTE) The Xpert Xpress SARS-CoV-2/FLU/RSV assay is intended as an aid in  the diagnosis of influenza from Nasopharyngeal swab specimens and  should not be used as a sole basis for treatment. Nasal washings and  aspirates are unacceptable for Xpert Xpress SARS-CoV-2/FLU/RSV  testing.  Fact Sheet for Patients: PinkCheek.be  Fact Sheet for Healthcare Providers: GravelBags.it  This test is not yet approved or cleared by the [place] FDA and  has been authorized for detection and/or diagnosis of SARS-CoV-2 by  FDA under an Emergency Use Authorization (EUA). This EUA will remain  in effect (meaning this test can be used) for the duration of the  Covid-19 declaration under Section 564(b)(1) of the Act, 21  U.S.C. section 360bbb-3(b)(1), unless the authorization is  terminated or revoked. Performed at [facility], [address], [place], [place] [address]          Radiology Studies: No results found.      Scheduled Meds: . sodium chloride   Intravenous Once  . Chlorhexidine Gluconate Cloth  6 each Topical Daily  . methocarbamol  500 mg Oral TID  . sodium chloride flush  3 mL Intravenous Q12H  . sodium chloride flush  3 mL Intravenous Q12H   Continuous Infusions: . sodium chloride       LOS: 2 days    Time spent: 51mns    JKathie Dike MD [facility]   If 7PM-7AM, please contact night-coverage [email or web address]  01/29/2020, 7:00 PM

## 2020-01-30 ENCOUNTER — Ambulatory Visit (HOSPITAL_COMMUNITY): Payer: Medicare Other | Admitting: Hematology

## 2020-01-30 ENCOUNTER — Other Ambulatory Visit (HOSPITAL_COMMUNITY): Payer: Medicare Other

## 2020-01-30 ENCOUNTER — Inpatient Hospital Stay (HOSPITAL_COMMUNITY)
Admission: RE | Admit: 2020-01-30 | Discharge: 2020-02-06 | DRG: 809 | Disposition: A | Discharge status: Home / Self Care | Source: Hospice | Attending: Internal Medicine | Admitting: Internal Medicine

## 2020-01-30 ENCOUNTER — Ambulatory Visit (HOSPITAL_COMMUNITY): Payer: Medicare Other

## 2020-01-30 DIAGNOSIS — N185 Chronic kidney disease, stage 5: Secondary | ICD-10-CM | POA: Diagnosis present

## 2020-01-30 DIAGNOSIS — R451 Restlessness and agitation: Secondary | ICD-10-CM

## 2020-01-30 DIAGNOSIS — Z66 Do not resuscitate: Secondary | ICD-10-CM | POA: Diagnosis present

## 2020-01-30 DIAGNOSIS — Z886 Allergy status to analgesic agent status: Secondary | ICD-10-CM | POA: Diagnosis not present

## 2020-01-30 DIAGNOSIS — Z885 Allergy status to narcotic agent status: Secondary | ICD-10-CM | POA: Diagnosis not present

## 2020-01-30 DIAGNOSIS — Z888 Allergy status to other drugs, medicaments and biological substances status: Secondary | ICD-10-CM

## 2020-01-30 DIAGNOSIS — D696 Thrombocytopenia, unspecified: Secondary | ICD-10-CM

## 2020-01-30 DIAGNOSIS — Z8349 Family history of other endocrine, nutritional and metabolic diseases: Secondary | ICD-10-CM | POA: Diagnosis not present

## 2020-01-30 DIAGNOSIS — R627 Adult failure to thrive: Secondary | ICD-10-CM | POA: Diagnosis present

## 2020-01-30 DIAGNOSIS — Z8261 Family history of arthritis: Secondary | ICD-10-CM | POA: Diagnosis not present

## 2020-01-30 DIAGNOSIS — F17210 Nicotine dependence, cigarettes, uncomplicated: Secondary | ICD-10-CM | POA: Diagnosis present

## 2020-01-30 DIAGNOSIS — Z8249 Family history of ischemic heart disease and other diseases of the circulatory system: Secondary | ICD-10-CM

## 2020-01-30 DIAGNOSIS — C9000 Multiple myeloma not having achieved remission: Secondary | ICD-10-CM | POA: Diagnosis present

## 2020-01-30 DIAGNOSIS — Z825 Family history of asthma and other chronic lower respiratory diseases: Secondary | ICD-10-CM

## 2020-01-30 DIAGNOSIS — Z981 Arthrodesis status: Secondary | ICD-10-CM

## 2020-01-30 DIAGNOSIS — D61818 Other pancytopenia: Secondary | ICD-10-CM | POA: Diagnosis present

## 2020-01-30 DIAGNOSIS — Z515 Encounter for palliative care: Secondary | ICD-10-CM

## 2020-01-30 DIAGNOSIS — G893 Neoplasm related pain (acute) (chronic): Secondary | ICD-10-CM | POA: Diagnosis present

## 2020-01-30 DIAGNOSIS — C7951 Secondary malignant neoplasm of bone: Secondary | ICD-10-CM | POA: Diagnosis not present

## 2020-01-30 DIAGNOSIS — F411 Generalized anxiety disorder: Secondary | ICD-10-CM | POA: Diagnosis present

## 2020-01-30 DIAGNOSIS — M5416 Radiculopathy, lumbar region: Secondary | ICD-10-CM | POA: Diagnosis present

## 2020-01-30 DIAGNOSIS — J449 Chronic obstructive pulmonary disease, unspecified: Secondary | ICD-10-CM | POA: Diagnosis present

## 2020-01-30 DIAGNOSIS — N179 Acute kidney failure, unspecified: Secondary | ICD-10-CM

## 2020-01-30 DIAGNOSIS — Z20822 Contact with and (suspected) exposure to covid-19: Secondary | ICD-10-CM | POA: Diagnosis present

## 2020-01-30 MED ORDER — GLYCOPYRROLATE 0.2 MG/ML IJ SOLN: 0.2000 mg | INTRAMUSCULAR | Status: DC | PRN

## 2020-01-30 MED ORDER — FENTANYL 2500MCG IN NS 250ML (10MCG/ML) PREMIX INFUSION
100.0000 ug/h | INTRAVENOUS | Status: AC
  Administered 2020-01-31 – 2020-02-01 (×2): 100 ug/h via INTRAVENOUS
  Filled 2020-01-30 (×2): qty 250

## 2020-01-30 MED ORDER — FENTANYL BOLUS VIA INFUSION
50.0000 ug | INTRAVENOUS | Status: DC | PRN
  Filled 2020-01-30: qty 100

## 2020-01-30 MED ORDER — DIPHENHYDRAMINE HCL 50 MG/ML IJ SOLN: 12.5000 mg | Freq: Four times a day (QID) | INTRAMUSCULAR | Status: DC | PRN

## 2020-01-30 MED ORDER — ONDANSETRON HCL 4 MG/2ML IJ SOLN: 4.0000 mg | Freq: Four times a day (QID) | INTRAMUSCULAR | Status: DC | PRN

## 2020-01-30 MED ORDER — ACETAMINOPHEN 650 MG RE SUPP: 650.0000 mg | Freq: Four times a day (QID) | RECTAL | Status: DC | PRN

## 2020-01-30 MED ORDER — HALOPERIDOL LACTATE 2 MG/ML PO CONC: 0.5000 mg | ORAL | Status: DC | PRN

## 2020-01-30 MED ORDER — LORAZEPAM 2 MG/ML IJ SOLN
1.0000 mg | INTRAMUSCULAR | Status: DC | PRN
  Administered 2020-01-30: 1 mg via INTRAVENOUS
  Filled 2020-01-30: qty 1

## 2020-01-30 MED ORDER — FENTANYL BOLUS VIA INFUSION
30.0000 ug | INTRAVENOUS | Status: DC | PRN
  Administered 2020-01-30: 30 ug via INTRAVENOUS
  Filled 2020-01-30: qty 30

## 2020-01-30 MED ORDER — BIOTENE DRY MOUTH MT LIQD: 15.0000 mL | OROMUCOSAL | Status: DC | PRN

## 2020-01-30 MED ORDER — GLYCOPYRROLATE 0.2 MG/ML IJ SOLN
0.2000 mg | INTRAMUSCULAR | Status: DC | PRN
  Administered 2020-02-03 – 2020-02-06 (×3): 0.2 mg via INTRAVENOUS
  Filled 2020-01-30 (×4): qty 1

## 2020-01-30 MED ORDER — ONDANSETRON 4 MG PO TBDP: 4.0000 mg | ORAL_TABLET | Freq: Four times a day (QID) | ORAL | Status: DC | PRN

## 2020-01-30 MED ORDER — LACTULOSE 10 GM/15ML PO SOLN: 30.0000 g | Freq: Every day | ORAL | Status: DC | PRN

## 2020-01-30 MED ORDER — FENTANYL 2500MCG IN NS 250ML (10MCG/ML) PREMIX INFUSION
100.0000 ug/h | INTRAVENOUS | Status: DC
  Administered 2020-01-30: 100 ug/h via INTRAVENOUS
  Filled 2020-01-30: qty 250

## 2020-01-30 MED ORDER — POLYVINYL ALCOHOL 1.4 % OP SOLN: 1.0000 [drp] | Freq: Four times a day (QID) | OPHTHALMIC | Status: DC | PRN

## 2020-01-30 MED ORDER — ACETAMINOPHEN 325 MG PO TABS: 650.0000 mg | ORAL_TABLET | Freq: Four times a day (QID) | ORAL | Status: DC | PRN

## 2020-01-30 MED ORDER — HALOPERIDOL LACTATE 5 MG/ML IJ SOLN
2.0000 mg | Freq: Four times a day (QID) | INTRAMUSCULAR | Status: DC | PRN
  Administered 2020-01-30: 2 mg via INTRAVENOUS
  Filled 2020-01-30: qty 1

## 2020-01-30 MED ORDER — HALOPERIDOL LACTATE 5 MG/ML IJ SOLN
0.5000 mg | INTRAMUSCULAR | Status: DC | PRN
  Administered 2020-01-30 – 2020-01-31 (×2): 0.5 mg via INTRAVENOUS
  Filled 2020-01-30 (×2): qty 1

## 2020-01-30 MED ORDER — HALOPERIDOL 0.5 MG PO TABS: 0.5000 mg | ORAL_TABLET | ORAL | Status: DC | PRN

## 2020-01-30 MED ORDER — GLYCOPYRROLATE 1 MG PO TABS: 1.0000 mg | ORAL_TABLET | ORAL | Status: DC | PRN

## 2020-01-30 NOTE — TOC Progression Note (Signed)
Transition of Care [facility]) - Progression Note    Patient Details  Name: [name] MRN: [id number] Date of Birth: Nov 12, 1960  Transition of Care [facility]) CM/SW Contact  Salome Arnt, [place] Phone Number: 02/12/2020, 12:34 PM  Clinical Narrative:  Per MD, pt's daughter has decided to pursue comfort care. LCSW discussed referral to [facility] and daughter agreeable. Per Cassandra at [facility], RN will be here this afternoon and pt will be made GIP. MD updated. Awaiting call from Hospice that it is okay to flip chart.            Expected Discharge Plan and Services                                                 Social Determinants of Health (SDOH) Interventions    Readmission Risk Interventions Readmission Risk Prevention Plan 07/16/2018 07/16/2018 06/14/2018  Transportation Screening Complete Complete Complete  Social Work Consult for [place] Planning/Counseling - - Complete  PCP or Specialist appointment within 3-5 days of discharge Complete - -  [place] or Home Care Consult Complete - -  Palliative Care Screening Not Applicable - -  [place] Not Applicable - -  Some recent data might be hidden

## 2020-01-30 NOTE — H&P (Signed)
History and Physical    [name] XTG:[id number] DOB: [birth year] DOA: 02/07/2020  PCP: Rosita Fire, MD  Patient coming from: Home  I have personally briefly reviewed patient's old medical records in [place]  Chief Complaint: GIP status  HPI: [name] is a 59 y.o. male with medical history significant of end-stage myeloma, associated pancytopenia, generalized failure to thrive, chronic pain from underlying malignancy, admitted to the hospital with severe cytopenias including thrombocytopenia and anemia requiring transfusion of PRBC and platelets.  He was continued on pain management.  He was recently seen at [facility] by oncology and was not felt to be a candidate for any further treatments.  After seeing palliative care, family elected to pursue comfort measures and has been transition to comfort care.  He is currently on fentanyl infusion for pain management and respiratory distress.  He is waiting a bed at residential hospice.   Past Medical History:  Diagnosis Date  . Allergy   . Arthritis    neck and back  . Blood transfusion without reported diagnosis   . Bone metastases ([place]) 09/19/2019  . BPH (benign prostatic hyperplasia)   . Cancer [facility]) 2004   testicle  . Chronic kidney disease    kidney stones  . Left lumbar radiculopathy 06/12/2016  . 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 Medical history non-contributory    Pt has scattered thoughts and uncertain of past medical history  . Panlobular emphysema ([place]) 05/28/2016  . Stones, urinary tract   . Substance abuse ([place])    prescribed oxydcodone- 10 years    Past Surgical History:  Procedure Laterality Date  . BACK SURGERY     x5  . CERVICAL DISC SURGERY     x2  . COLONOSCOPY WITH PROPOFOL N/A 08/11/2016   Dr. Gala Romney: non-bleeding internal hemorrhoids, one 4 mm hyperplastic rectal polyp, diverticulosis in entire examined colon  . POLYPECTOMY  08/11/2016   Procedure: POLYPECTOMY;  Surgeon:  [name], MD;  Location: [facility] ENDO SUITE;  Service: Endoscopy;;  colon  . PORTACATH PLACEMENT Left 09/20/2018   Procedure: INSERTION PORT-A-CATH (catheter attached left subclavian);  Surgeon: Virl Cagey, MD;  Location: [facility] ORS;  Service: General;  Laterality: Left;  . testicular cancer  2004  . TONSILLECTOMY      Social History:  reports that he has been smoking cigarettes. He started smoking about 45 years ago. He has a 40.00 pack-year smoking history. He has never used smokeless tobacco. He reports current alcohol use. He reports that he does not use drugs.  Allergies  Allergen Reactions  . Oxycodone-Acetaminophen   . Acetaminophen Nausea Only and Other (See Comments)    Elevated liver enzymes  . Cymbalta [Duloxetine Hcl] Swelling    Facial swelling  . Diclofenac Sodium Swelling       . Diclofenac Sodium Swelling  . Imodium [Loperamide] Swelling    facial  . Lyrica [Pregabalin] Swelling  . Morphine Other (See Comments)    Bradycardia   . Vioxx [Rofecoxib] Swelling  . Aleve [Naproxen] Swelling    eye    Family History  Problem Relation Age of Onset  . COPD Mother   . Heart disease Mother   . Other Father        Never knew his father.  . Thyroid disease Sister   . Arthritis Sister   . Heart disease Sister        bypass  . Colon cancer Neg Hx      Prior  to Admission medications   Not on File    Physical Exam: Vitals:   02/22/2020 1700  Temp: 99.1 F (37.3 C)  TempSrc: Axillary    Constitutional: Somnolent Respiratory: clear to auscultation bilaterally, no wheezing, no crackles. Normal respiratory effort. No accessory muscle use.  Cardiovascular: Regular rate and rhythm, no murmurs / rubs / gallops. No extremity edema. 2+ pedal pulses. No carotid bruits.     Labs on Admission: I have personally reviewed following labs and imaging studies  CBC: Recent Labs  Lab 01/25/20 0842 01/27/20 1114 01/28/20 1011 01/29/20 0857  WBC 1.9* 1.9* 1.3*  1.4*  NEUTROABS 0.8* 0.7*  --   --   HGB 6.8* 6.1* 8.8* 8.0*  HCT 20.8* 18.6* 25.6* 23.6*  MCV 101.0* 100.5* 93.1 94.0  PLT 10* 12* 23* 14*   Basic Metabolic Panel: Recent Labs  Lab 01/25/20 0842 01/27/20 1114 01/28/20 1011 01/29/20 0857  NA 138 135 137 135  K 4.4 5.4* 4.4 4.2  CL 104 101 100 101  CO2 21* 16* 19* 20*  GLUCOSE 91 88 135* 95  BUN 86* 91* 102* 104*  CREATININE 5.78* 5.59* 5.64* 5.24*  CALCIUM 9.6 8.5* 8.4* 7.9*   GFR: CrCl cannot be calculated (Unknown ideal weight.). Liver Function Tests: Recent Labs  Lab 01/25/20 0842 01/27/20 1114  AST 28 144*  ALT 25 24  ALKPHOS 123 158*  BILITOT 0.9 1.7*  PROT 6.4* 6.1*  ALBUMIN 3.1* 3.1*   No results for input(s): LIPASE, AMYLASE in the last 168 hours. No results for input(s): AMMONIA in the last 168 hours. Coagulation Profile: No results for input(s): INR, PROTIME in the last 168 hours. Cardiac Enzymes: No results for input(s): CKTOTAL, CKMB, CKMBINDEX, TROPONINI in the last 168 hours. BNP (last 3 results) No results for input(s): PROBNP in the last 8760 hours. HbA1C: No results for input(s): HGBA1C in the last 72 hours. CBG: No results for input(s): GLUCAP in the last 168 hours. Lipid Profile: No results for input(s): CHOL, HDL, LDLCALC, TRIG, CHOLHDL, LDLDIRECT in the last 72 hours. Thyroid Function Tests: No results for input(s): TSH, T4TOTAL, FREET4, T3FREE, THYROIDAB in the last 72 hours. Anemia Panel: No results for input(s): VITAMINB12, FOLATE, FERRITIN, TIBC, IRON, RETICCTPCT in the last 72 hours. Urine analysis:    Component Value Date/Time   COLORURINE STRAW (A) 07/15/2018 2033   APPEARANCEUR CLEAR 07/15/2018 2033   LABSPEC 1.006 07/15/2018 2033   PHURINE 5.0 07/15/2018 2033   GLUCOSEU NEGATIVE 07/15/2018 2033   HGBUR SMALL (A) 07/15/2018 2033   BILIRUBINUR NEGATIVE 07/15/2018 2033   [place] NEGATIVE 07/15/2018 2033   PROTEINUR NEGATIVE 07/15/2018 2033   UROBILINOGEN 0.2 02/09/2008  2042   NITRITE NEGATIVE 07/15/2018 2033   LEUKOCYTESUR NEGATIVE 07/15/2018 2033    Radiological Exams on Admission: No results found.    Assessment/Plan Active Problems:   Pancytopenia ([place])     59 year old male with endstage myeloma and associated severe pancytopenia with chronic pain, currently on fentanyl infusion for intractable pain.  He is awaiting a bed at residential hospice.    Kathie Dike MD [facility]   If 7PM-7AM, please contact night-coverage [email or web address]   02/23/2020, 8:53 PM

## 2020-01-30 NOTE — Consult Note (Signed)
Consultation Note Date: 02/25/2020   Patient Name: [name]  DOB: 1960/07/08  MRN: [id number]  Age / Sex: 59 y.o., male  PCP: Brandon Fire, MD Referring Physician: Kathie Dike, MD  Reason for Consultation: Establishing goals of care and Terminal Care  HPI/Patient Profile: 59 y.o. male  with past medical history of kappa light chain myeloma, CKD stage V, spinal stenosis/lumbar radiculopathy s/p fusion, COPD, former smoker, substance abuse history admitted on 01/27/2020 with weakness and deconditioning. Hospital admission for severe pancytopenia. Recent bone marrow biopsy with 97% plasma cells. Severe pancytopenia from marrow infiltration form myeloma. Evaluated by myeloma specialist Dr. Aris Rowland on 01/26/20 and recommendation was for transition to palliative/hospice without therapeutic oncology options and poor prognosis. Dr. Ulice Rowland spoke with patient and daughter on 10/31 and decision was made to transition to comfort measures and residential hospice placement. Palliative medicine consultation for terminal care and symptom management.   Clinical Assessment and Goals of Care: Reviewed medical records, discussed with Dr. Roderic Rowland, and met with patient and multiple family members at bedside.   [name] is awake but confused, agitated, and restless. Per daughter, [name], he experiences pain with all movement. PRN Fentanyl minimally relieving his symptoms.   Met with daughter, [name] in the hallway to discuss plan of care. She has been in contact with hospice liaison this morning to complete residential hospice paperwork. Confirmed plan for comfort measures and transition to hospice when bed available.   Discussed symptom management medications and recommendation for either scheduled pain medication or continuous infusion to ensure comfort and relief from suffering. [name] agrees with starting continuous  infusion as she acknowledges her father is uncomfortable. Also, much different than yesterday when he was awake and oriented. Today, confused, hallucinating, and talking to a deceased family member.   Educated on comfort focused pathway and EOL expectations. Discussed symptom management medications and comfort feeds. Discussed unrestricted visitor access. Daughter asks about prognosis. I have prepared her for 'anything to happen at any time' and prognosis is likely weeks if not days. Reassured of ongoing evaluation each day and that we would certainly not transfer him to hospice facility if he seemed to be nearing EOL.   Answered questions. Emotional support provided. PMT contact information given and encouraged daughter to call me this afternoon with questions or symptom management concerns.   Updated Dr. Roderic Rowland and RN on plan of care.   SUMMARY OF RECOMMENDATIONS    Comfort measures only. Interventions not aimed at comfort have been discontinued.   Symptom management medications--see below.  Comfort feeds per patient/family request.   Unrestricted visitor access for EOL care.   TOC team following for residential hospice placement.   Spiritual care consult for support and prayer.   Code Status/Advance Care Planning:  DNR  Symptom Management:   Initiate continuous fentanyl infusion 199mg/hour  RN may bolus fentanyl via infusion 50-103m q3051mprn breakthrough pain/dyspnea/air hunger/tachypnea  Ativan 1mg80m q3h prn anxiety  Haldol 2mg 64mq6h prn agitation  Robinul 0.2mg I58m4h prn secretions  Benadryl 12.5mg50m  IV q6h prn itching/reaction to fentanyl   Palliative Prophylaxis:   Aspiration, Delirium Protocol, Frequent Pain Assessment, Oral Care and Turn Reposition  Additional Recommendations (Limitations, Scope, Preferences):  Full Comfort Care  Psycho-social/Spiritual:   Desire for further Chaplaincy support:yes  Additional Recommendations: Caregiving   Support/Resources, Compassionate Wean Education and Education on Hospice  Prognosis:   <2 weeks if not days  Discharge Planning: GIP status, waiting on inpatient hospice facility bed.     Primary Diagnoses: Present on Admission: . Pancytopenia ([name]) . AKI (acute kidney injury) ([place]) . Bone metastases ([name]) . CKD (chronic kidney disease), stage V ([name]) . Multiple myeloma not having achieved remission ([name]) . Kappa light chain myeloma ([name]) . Panlobular emphysema ([name]) . Anemia   I have reviewed the medical record, interviewed the patient and family, and examined the patient. The following aspects are pertinent.  Past Medical History:  Diagnosis Date  . Allergy   . Arthritis    neck and back  . Blood transfusion without reported diagnosis   . Bone metastases ([name]) 09/19/2019  . BPH (benign prostatic hyperplasia)   . 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 Chronic kidney disease    kidney stones  . Left lumbar radiculopathy 06/12/2016  . 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 Medical history non-contributory    Pt has scattered thoughts and uncertain of past medical history  . Panlobular emphysema ([name]) 05/28/2016  . Stones, urinary tract   . Substance abuse ([name])    prescribed oxydcodone- 10 years   Social History   Socioeconomic History  . Marital status: Single    Spouse name: Not on file  . Number of children: 1  . Years of education: 57  . Highest education level: Not on file  Occupational History  . Occupation: retired    Comment: Psychologist, prison and probation services  . Occupation: disabled  Tobacco Use  . Smoking status: Current Every Day Smoker    Packs/day: 1.00    Years: 40.00    Pack years: 40.00    Types: Cigarettes    Start date: 03/31/1974  . Smokeless tobacco: Never Used  Vaping Use  . Vaping Use: Never used  Substance and Sexual Activity  . Alcohol use: Yes    Comment: Occasional  . Drug use: No    Types: Cocaine    Comment: Remote hx of cocaine, quit 1989  . Sexual activity: Yes   Other Topics Concern  . Not on file  Social History Narrative   [employer] for 12 years   [employer] for 62 years      Never married   One daughter      Lives alone   Retail banker   Right-handed   Occasional caffeine use         Social Determinants of Health   Financial Resource Strain:   . Difficulty of Paying Living Expenses: Not on file  Food Insecurity:   . Worried About Charity fundraiser in the Last Year: Not on file  . Ran Out of Food in the Last Year: Not on file  Transportation Needs:   . Lack of Transportation (Medical): Not on file  . Lack of Transportation (Non-Medical): Not on file  Physical Activity:   . Days of Exercise per Week: Not on file  . Minutes of Exercise per Session: Not on file  Stress:   . Feeling of Stress : Not on file  Social Connections:   . Frequency of Communication with  Friends and Family: Not on file  . Frequency of Social Gatherings with Friends and Family: Not on file  . Attends Religious Services: Not on file  . Active Member of Clubs or Organizations: Not on file  . Attends Archivist Meetings: Not on file  . Marital Status: Not on file   Family History  Problem Relation Age of Onset  . COPD Mother   . Heart disease Mother   . Other Father        Never knew his father.  . Thyroid disease Sister   . Arthritis Sister   . Heart disease Sister        bypass  . Colon cancer Neg Hx    Scheduled Meds: . sodium chloride   Intravenous Once  . methocarbamol  500 mg Oral TID  . sodium chloride flush  3 mL Intravenous Q12H  . sodium chloride flush  3 mL Intravenous Q12H   Continuous Infusions: . sodium chloride    . fentaNYL infusion INTRAVENOUS     PRN Meds:.sodium chloride, acetaminophen **OR** acetaminophen, bisacodyl, diphenhydrAMINE, fentaNYL, glycopyrrolate, haloperidol lactate, HYDROmorphone, lactulose, LORazepam, ondansetron **OR** ondansetron (ZOFRAN) IV, sodium chloride flush Medications  Prior to Admission:  Prior to Admission medications   Medication Sig Start Date End Date Taking? Authorizing Provider  aspirin EC 81 MG tablet Take 81 mg by mouth daily.   Yes [provider]  calcitRIOL (ROCALTROL) 0.5 MCG capsule Take 0.5 mcg by mouth daily.  10/29/18  Yes [provider]  dexamethasone (DECADRON) 4 MG tablet Take 10 tablets (40 mg total) by mouth daily. 01/17/20  Yes Derek Jack, MD  HYDROmorphone (DILAUDID) 4 MG tablet Take 1 tablet (4 mg total) by mouth every 3 (three) hours as needed for severe pain. 01/19/20  Yes Derek Jack, MD  Lactulose 20 GM/30ML SOLN Take 56m every 3 hours until you have a bowel movement.  Then take 326monce daily. 01/17/20  Yes KaDerek JackMD  Patiromer Sorbitex Calcium (VELTASSA PO) Take by mouth 2 (two) times a week.    Yes [provider]  prochlorperazine (COMPAZINE) 5 MG tablet Take 1 tablet (5 mg total) by mouth every 6 (six) hours as needed for nausea or vomiting. 04/28/19  Yes KaDerek JackMD  senna (SENOKOT) 8.6 MG tablet Take 1 tablet by mouth 2 (two) times daily.    Yes [provider]  sodium bicarbonate 650 MG tablet Take 1,300 mg by mouth 2 (two) times daily.   Yes [provider]  torsemide (DEMADEX) 20 MG tablet TAKING 1 TABLET DAILY, AND 2 TABLETS ONLY IF HIS ANKLES ARE REALLY SWOLLEN 10/13/18  Yes KaDerek JackMD  dexamethasone (DECADRON) 4 MG tablet Take 10 tablets (40 mg total) by mouth every 7 (seven) days. Take for 2 days starting the night of chemotherapy. 11/18/19   KaDerek JackMD  Epoetin Alfa (PROCRIT IJ) Inject as directed. Unsure of dosage- gets this once weekly    [provider]  pomalidomide (POMALYST) 3 MG capsule Take 1 capsule (3 mg total) by mouth daily. 12/29/19   KaDerek JackMD  valACYclovir (VALTREX) 500 MG tablet TAKE 1 TABLET BY MOUTH TWICE A DAY 02/07/19   Lockamy, Randi L, NP-C   Allergies   Allergen Reactions  . Oxycodone-Acetaminophen   . Acetaminophen Nausea Only and Other (See Comments)    Elevated liver enzymes  . Cymbalta [Duloxetine Hcl] Swelling    Facial swelling  . Diclofenac Sodium Swelling       .  Diclofenac Sodium Swelling  . Imodium [Loperamide] Swelling    facial  . Lyrica [Pregabalin] Swelling  . Morphine Other (See Comments)    Bradycardia   . Vioxx [Rofecoxib] Swelling  . Aleve [Naproxen] Swelling    eye   Review of Systems  Unable to perform ROS: Acuity of condition   Physical Exam Vitals and nursing note reviewed.  Constitutional:      Appearance: He is ill-appearing.  HENT:     Head: Normocephalic and atraumatic.  Pulmonary:     Effort: No tachypnea, accessory muscle usage or respiratory distress.  Skin:    General: Skin is warm and dry.  Neurological:     Mental Status: He is easily aroused.     Comments: Disoriented, restless, agitated  Psychiatric:        Speech: He is noncommunicative.        Behavior: Behavior is agitated.        Cognition and Memory: Cognition is impaired.    Vital Signs: BP (!) 124/91 (BP Location: Left Arm)   Pulse 90   Temp 99 F (37.2 C) (Oral)   Resp 20   Ht 6' (1.829 m)   SpO2 100%   BMI 19.32 kg/m  Pain Scale: PAINAD POSS *See Group Information*: 1-Acceptable,Awake and alert Pain Score: 4    SpO2: SpO2: 100 % O2 Device:SpO2: 100 % O2 Flow Rate: .   IO: Intake/output summary: No intake or output data in the 24 hours ending 02/16/2020 1324  LBM: Last BM Date: 01/28/20 Baseline Weight:   Most recent weight:       Palliative Assessment/Data: PPS 20%   Flowsheet Rows     Most Recent Value  Intake Tab  Referral Department Hospitalist  Unit at Time of Referral Med/Surg Unit  Palliative Care Primary Diagnosis Cancer  Palliative Care Type New Palliative care  Reason for referral Counsel Regarding Hospice, End of Life Care Assistance, Pain, Non-pain Symptom  Date first seen by Palliative  Care 02/04/2020  Clinical Assessment  Palliative Performance Scale Score 20%  Psychosocial & Spiritual Assessment  Palliative Care Outcomes  Patient/Family meeting held? Yes  Who was at the meeting? daughter [name])  Palliative Care Outcomes Improved pain interventions, Improved non-pain symptom therapy, Counseled regarding hospice, Clarified goals of care, Provided end of life care assistance, Provided psychosocial or spiritual support, ACP counseling assistance       Time Total: 86mn Greater than 50%  of this time was spent counseling and coordinating care related to the above assessment and plan.  Signed by:  MIhor Dow DNP, FNP-C Palliative Medicine Team  Phone: [phone]: [phone]  Please contact Palliative Medicine Team phone at [phone] questions and concerns.  For individual provider: See AShea Evans

## 2020-01-30 NOTE — Discharge Summary (Addendum)
Physician Discharge Summary  [name] TKP:[id number] DOB: 06/17/1960 DOA: 01/27/2020  PCP: Rosita Fire, MD  Admit date: 01/27/2020 Discharge date: 02/11/2020  Admitted From: Home Disposition: Residential hospice  Recommendations for Outpatient Follow-up:  Patient is currently GIP status and is awaiting bed at residential hospice  Discharge Condition: Hospice CODE STATUS: DNR, comfort measures Diet recommendation: Regular diet for comfort  Brief/Interim Summary: [name]  is a 59 y.o. male former smoker with medical history significant for substance abuse, Spinal Stenosis/lumbar radiculopathy status post fusion (07/24/2015), COPD, kappa light chain myeloma and chronic kidney disease stage V presenting with increasing weakness and deconditioning - -Patient has become transfusion dependent recently complains of increasing generalized pain - He has developed severe pancytopenia -Recent bone marrow biopsy results which showed 97% plasma cells. -He has developed severe Pancytopenias from marrow infiltration from myeloma -platelet count is 12 , --WBC 1.9 and hemoglobin 6.1 -Evaluated on 01/26/20 by myeloma specialist Dr.-Jonathan Percell Miller Lambird--recommended transition to palliative care and hospice patient does not have any good therapeutic options --Patient and family at this time requesting transfusion of PRBCs and platelets -Palliative consult requested but not available at this time   -No chest pains per se no fevers or chills no bleeding concerns at this time     No Nausea, Vomiting or Diarrhea   -PTA was on oral Dilaudid, pain got worse--- will need IV pain control for now after failing oral pain medication at home  Discharge Diagnoses:  Principal Problem:   Pancytopenia ([place]) Active Problems:   Panlobular emphysema ([place])   AKI (acute kidney injury) ([place])   Kappa light chain myeloma ([place])   Goals of care, counseling/discussion   Terminal care   CKD (chronic  kidney disease), stage V ([place])   Anemia   Multiple myeloma not having achieved remission ([facility])   Bone metastases ([facility])   Agitation   Cancer related pain Opiate dependence  Severe pancytopenia -Secondary to myeloma -Platelet count had initially improved with transfusion of platelets, but has since trended down -Hemoglobin also initially improved after PRBC transfusion, but has since began to trend down     Kappa light chain myeloma -Patient has been treated by oncology and has limited treatment options at this point -He had seen specialist at [facility] who did not feel he was a candidate for further treatment and recommended palliative care -Updated Dr. Delton Coombes on plan   Chronic kidney disease stage V -Creatinine is elevated, but appears stable -He is not a candidate for hemodialysis   Lumbar radiculopathy -Per neurosurgery, nonoperative management recommended -Continue pain management   COPD -Stable on room air, continue as needed bronchodilators   Chronic pain syndrome -Secondary to underlying malignancy -Continue pain management and adjust as needed   Goals of care -Patient is DNR -After long discussion with family, they have elected to transition the patient to comfort care and are requesting placement in residential hospice -Since no beds are available at residential hospice, he will be transitioned to GIP status -Continue to fentanyl infusion to be titrated for pain management  Discharge Instructions  Discharge Instructions     Diet - low sodium heart healthy   Complete by: As directed    Increase activity slowly   Complete by: As directed       Allergies as of 02/02/2020       Reactions   Oxycodone-acetaminophen    Acetaminophen Nausea Only, Other (See Comments)   Elevated liver enzymes   Cymbalta [duloxetine Hcl] Swelling  Facial swelling   Diclofenac Sodium Swelling      Diclofenac Sodium Swelling   Imodium [loperamide] Swelling    facial   Lyrica [pregabalin] Swelling   Morphine Other (See Comments)   Bradycardia   Vioxx [rofecoxib] Swelling   Aleve [naproxen] Swelling   eye        Medication List     STOP taking these medications    aspirin EC 81 MG tablet   calcitRIOL 0.5 MCG capsule Commonly known as: ROCALTROL   dexamethasone 4 MG tablet Commonly known as: DECADRON   HYDROmorphone 4 MG tablet Commonly known as: Dilaudid   Lactulose 20 GM/30ML Soln   pomalidomide 3 MG capsule Commonly known as: POMALYST   prochlorperazine 5 MG tablet Commonly known as: COMPAZINE   PROCRIT IJ   senna 8.6 MG tablet Commonly known as: SENOKOT   sodium bicarbonate 650 MG tablet   torsemide 20 MG tablet Commonly known as: DEMADEX   valACYclovir 500 MG tablet Commonly known as: VALTREX   VELTASSA PO         Allergies  Allergen Reactions   Oxycodone-Acetaminophen    Acetaminophen Nausea Only and Other (See Comments)    Elevated liver enzymes   Cymbalta [Duloxetine Hcl] Swelling    Facial swelling   Diclofenac Sodium Swelling        Diclofenac Sodium Swelling   Imodium [Loperamide] Swelling    facial   Lyrica [Pregabalin] Swelling   Morphine Other (See Comments)    Bradycardia    Vioxx [Rofecoxib] Swelling   Aleve [Naproxen] Swelling    eye    Consultations: Palliative care   Procedures/Studies: No results found.    Subjective: Patient is not responsive  Discharge Exam: Vitals:   01/28/20 2133 01/29/20 0606 01/29/20 1239 02/20/2020 0542  BP: 132/64 131/65 116/60 (!) 124/91  Pulse: (!) 108 (!) 101 (!) 105 90  Resp: _0 Temp: 99.2 F (37.3 C) 99.2 F (37.3 C) 97.6 F (36.4 C) 99 F (37.2 C)  TempSrc: Oral Oral Oral Oral  SpO2: 99% 100% 100% 100%  Height:        General: Somnolent Cardiovascular: RRR, S1/S2 +, no rubs, no gallops Respiratory: CTA bilaterally, no wheezing, no rhonchi Abdominal: Soft, NT, ND, bowel sounds + Extremities: no edema, no  cyanosis    The results of significant diagnostics from this hospitalization (including imaging, microbiology, ancillary and laboratory) are listed below for reference.     Microbiology: Recent Results (from the past 240 hour(s))  Respiratory Panel by RT PCR (Flu A&B, Covid) - Nasopharyngeal Swab     Status: None   Collection Time: 01/27/20  1:41 PM   Specimen: Nasopharyngeal Swab  Result Value Ref Range Status   SARS Coronavirus 2 by RT PCR NEGATIVE NEGATIVE Final    Comment: (NOTE) SARS-CoV-2 target nucleic acids are NOT DETECTED.  The SARS-CoV-2 RNA is generally detectable in upper respiratoy specimens during the acute phase of infection. The lowest concentration of SARS-CoV-2 viral copies this assay can detect is 131 copies/mL. A negative result does not preclude SARS-Cov-2 infection and should not be used as the sole basis for treatment or other patient management decisions. A negative result may occur with  improper specimen collection/handling, submission of specimen other than nasopharyngeal swab, presence of viral mutation(s) within the areas targeted by this assay, and inadequate number of viral copies (<131 copies/mL). A negative result must be combined with clinical observations, patient history, and epidemiological information. The expected result  is Negative.  Fact Sheet for Patients:  PinkCheek.be  Fact Sheet for Healthcare Providers:  GravelBags.it  This test is no t yet approved or cleared by the [place] FDA and  has been authorized for detection and/or diagnosis of SARS-CoV-2 by FDA under an Emergency Use Authorization (EUA). This EUA will remain  in effect (meaning this test can be used) for the duration of the COVID-19 declaration under Section 564(b)(1) of the Act, 21 U.S.C. section 360bbb-3(b)(1), unless the authorization is terminated or revoked sooner.     Influenza A by PCR NEGATIVE  NEGATIVE Final   Influenza B by PCR NEGATIVE NEGATIVE Final    Comment: (NOTE) The Xpert Xpress SARS-CoV-2/FLU/RSV assay is intended as an aid in  the diagnosis of influenza from Nasopharyngeal swab specimens and  should not be used as a sole basis for treatment. Nasal washings and  aspirates are unacceptable for Xpert Xpress SARS-CoV-2/FLU/RSV  testing.  Fact Sheet for Patients: PinkCheek.be  Fact Sheet for Healthcare Providers: GravelBags.it  This test is not yet approved or cleared by the [place] FDA and  has been authorized for detection and/or diagnosis of SARS-CoV-2 by  FDA under an Emergency Use Authorization (EUA). This EUA will remain  in effect (meaning this test can be used) for the duration of the  Covid-19 declaration under Section 564(b)(1) of the Act, 21  U.S.C. section 360bbb-3(b)(1), unless the authorization is  terminated or revoked. Performed at [facility], [address], [place], [place] [address]      Labs: BNP (last 3 results) No results for input(s): BNP in the last 8760 hours. Basic Metabolic Panel: Recent Labs  Lab 01/25/20 0842 01/27/20 1114 01/28/20 1011 01/29/20 0857  NA 138 135 137 135  K 4.4 5.4* 4.4 4.2  CL 104 101 100 101  CO2 21* 16* 19* 20*  GLUCOSE 91 88 135* 95  BUN 86* 91* 102* 104*  CREATININE 5.78* 5.59* 5.64* 5.24*  CALCIUM 9.6 8.5* 8.4* 7.9*   Liver Function Tests: Recent Labs  Lab 01/25/20 0842 01/27/20 1114  AST 28 144*  ALT 25 24  ALKPHOS 123 158*  BILITOT 0.9 1.7*  PROT 6.4* 6.1*  ALBUMIN 3.1* 3.1*   No results for input(s): LIPASE, AMYLASE in the last 168 hours. No results for input(s): AMMONIA in the last 168 hours. CBC: Recent Labs  Lab 01/25/20 0842 01/27/20 1114 01/28/20 1011 01/29/20 0857  WBC 1.9* 1.9* 1.3* 1.4*  NEUTROABS 0.8* 0.7*  --   --   HGB 6.8* 6.1* 8.8* 8.0*  HCT 20.8* 18.6* 25.6* 23.6*  MCV 101.0* 100.5* 93.1 94.0  PLT  10* 12* 23* 14*   Cardiac Enzymes: No results for input(s): CKTOTAL, CKMB, CKMBINDEX, TROPONINI in the last 168 hours. BNP: Invalid input(s): POCBNP CBG: No results for input(s): GLUCAP in the last 168 hours. D-Dimer No results for input(s): DDIMER in the last 72 hours. Hgb A1c No results for input(s): HGBA1C in the last 72 hours. Lipid Profile No results for input(s): CHOL, HDL, LDLCALC, TRIG, CHOLHDL, LDLDIRECT in the last 72 hours. Thyroid function studies No results for input(s): TSH, T4TOTAL, T3FREE, THYROIDAB in the last 72 hours.  Invalid input(s): FREET3 Anemia work up No results for input(s): VITAMINB12, FOLATE, FERRITIN, TIBC, IRON, RETICCTPCT in the last 72 hours. Urinalysis    Component Value Date/Time   COLORURINE STRAW (A) 07/15/2018 2033   APPEARANCEUR CLEAR 07/15/2018 2033   LABSPEC 1.006 07/15/2018 2033   PHURINE 5.0 07/15/2018 2033   GLUCOSEU  NEGATIVE 07/15/2018 2033   HGBUR SMALL (A) 07/15/2018 2033   BILIRUBINUR NEGATIVE 07/15/2018 2033   KETONESUR NEGATIVE 07/15/2018 2033   PROTEINUR NEGATIVE 07/15/2018 2033   UROBILINOGEN 0.2 02/09/2008 2042   NITRITE NEGATIVE 07/15/2018 2033   LEUKOCYTESUR NEGATIVE 07/15/2018 2033   Sepsis Labs Invalid input(s): PROCALCITONIN,  WBC,  LACTICIDVEN Microbiology Recent Results (from the past 240 hour(s))  Respiratory Panel by RT PCR (Flu A&B, Covid) - Nasopharyngeal Swab     Status: None   Collection Time: 01/27/20  1:41 PM   Specimen: Nasopharyngeal Swab  Result Value Ref Range Status   SARS Coronavirus 2 by RT PCR NEGATIVE NEGATIVE Final    Comment: (NOTE) SARS-CoV-2 target nucleic acids are NOT DETECTED.  The SARS-CoV-2 RNA is generally detectable in upper respiratoy specimens during the acute phase of infection. The lowest concentration of SARS-CoV-2 viral copies this assay can detect is 131 copies/mL. A negative result does not preclude SARS-Cov-2 infection and should not be used as the sole basis for  treatment or other patient management decisions. A negative result may occur with  improper specimen collection/handling, submission of specimen other than nasopharyngeal swab, presence of viral mutation(s) within the areas targeted by this assay, and inadequate number of viral copies (<131 copies/mL). A negative result must be combined with clinical observations, patient history, and epidemiological information. The expected result is Negative.  Fact Sheet for Patients:  PinkCheek.be  Fact Sheet for Healthcare Providers:  GravelBags.it  This test is no t yet approved or cleared by the [place] FDA and  has been authorized for detection and/or diagnosis of SARS-CoV-2 by FDA under an Emergency Use Authorization (EUA). This EUA will remain  in effect (meaning this test can be used) for the duration of the COVID-19 declaration under Section 564(b)(1) of the Act, 21 U.S.C. section 360bbb-3(b)(1), unless the authorization is terminated or revoked sooner.     Influenza A by PCR NEGATIVE NEGATIVE Final   Influenza B by PCR NEGATIVE NEGATIVE Final    Comment: (NOTE) The Xpert Xpress SARS-CoV-2/FLU/RSV assay is intended as an aid in  the diagnosis of influenza from Nasopharyngeal swab specimens and  should not be used as a sole basis for treatment. Nasal washings and  aspirates are unacceptable for Xpert Xpress SARS-CoV-2/FLU/RSV  testing.  Fact Sheet for Patients: PinkCheek.be  Fact Sheet for Healthcare Providers: GravelBags.it  This test is not yet approved or cleared by the [place] FDA and  has been authorized for detection and/or diagnosis of SARS-CoV-2 by  FDA under an Emergency Use Authorization (EUA). This EUA will remain  in effect (meaning this test can be used) for the duration of the  Covid-19 declaration under Section 564(b)(1) of the Act, 21   U.S.C. section 360bbb-3(b)(1), unless the authorization is  terminated or revoked. Performed at [facility], [address], [place], [place] [address]      Time coordinating discharge: 21mns  SIGNED:   JKathie Dike MD  [facility] 02/23/2020, 8:48 PM   If 7PM-7AM, please contact night-coverage [email or web address]

## 2020-01-30 NOTE — Progress Notes (Signed)
Pt being dc and admitted under hospice services

## 2020-01-30 NOTE — Progress Notes (Signed)
Nutrition Brief Note  Chart reviewed.  Pt now transitioning to comfort care.   No further nutrition interventions warranted at this time.   Please re-consult as needed.   Lynn Layza Sum[place] MS,RD,CSG,LDN Pager: #[phone]   

## 2020-01-31 DIAGNOSIS — C9000 Multiple myeloma not having achieved remission: Secondary | ICD-10-CM

## 2020-01-31 DIAGNOSIS — D61818 Other pancytopenia: Principal | ICD-10-CM

## 2020-01-31 DIAGNOSIS — Z515 Encounter for palliative care: Secondary | ICD-10-CM

## 2020-01-31 MED ORDER — BISACODYL 10 MG RE SUPP: 10.0000 mg | Freq: Every day | RECTAL | Status: DC | PRN

## 2020-01-31 MED ORDER — HALOPERIDOL LACTATE 5 MG/ML IJ SOLN
2.0000 mg | Freq: Four times a day (QID) | INTRAMUSCULAR | Status: DC | PRN
  Administered 2020-01-31 – 2020-02-06 (×10): 2 mg via INTRAVENOUS
  Filled 2020-01-31 (×10): qty 1

## 2020-01-31 MED ORDER — FENTANYL BOLUS VIA INFUSION
50.0000 ug | INTRAVENOUS | Status: DC | PRN
  Administered 2020-01-31: 50 ug via INTRAVENOUS
  Administered 2020-01-31 – 2020-02-01 (×3): 100 ug via INTRAVENOUS
  Administered 2020-02-01: 50 ug via INTRAVENOUS
  Administered 2020-02-01 (×3): 100 ug via INTRAVENOUS
  Administered 2020-02-02: 50 ug via INTRAVENOUS
  Filled 2020-01-31: qty 100

## 2020-01-31 MED ORDER — HALOPERIDOL 2 MG PO TABS: 2.0000 mg | ORAL_TABLET | Freq: Four times a day (QID) | ORAL | Status: DC | PRN

## 2020-01-31 NOTE — Progress Notes (Signed)
PROGRESS NOTE    [name]  TFT:[id number] DOB: Sep 13, 1960 DOA: 02/21/2020 PCP: Rosita Fire, MD    Brief Narrative:  59 year old male with a history of kappa light chain myeloma, chronic kidney disease stage V, severe pancytopenia-transfusion dependent, admitted to the hospital with uncontrolled pain and severe cytopenias requiring transfusion of platelets and PRBCs.  After several discussions with the patient's family, it was decided to transition the patient towards comfort measures.  He has been accepted by hospice and is currently GIP status.  He is awaiting a bed at residential hospice.  Currently, pain is controlled on fentanyl infusion   Assessment & Plan:   Active Problems:   Palliative care by specialist   Pancytopenia [facility])  59 year old male with kappa light chain myeloma (not a candidate for further treatments), chronic kidney disease stage V, severe pancytopenia, currently on comfort measures.  He has intractable pain from his underlying malignancy.  He is on fentanyl infusion for symptom management.  Currently GIP status and awaiting a bed at residential hospice   DVT prophylaxis: None  Code Status: DNR, comfort measures Family Communication: Discussed with daughter at the bedside Disposition Plan: GIP, waiting on residential hospice bed     Consultants:   Palliative care  Procedures:     Antimicrobials:       Subjective: Patient wakes up to voice.  Reports that pain is controlled on Dilaudid infusion  Objective: Vitals:   02/28/2020 1700 01/31/20 0551  BP:  (!) 132/94  Pulse:  94  Resp:  18  Temp: 99.1 F (37.3 C) 97.8 F (36.6 C)  TempSrc: Axillary Oral  SpO2:  99%    Intake/Output Summary (Last 24 hours) at 01/31/2020 1842 Last data filed at 02/15/2020 2114 Gross per 24 hour  Intake 25 ml  Output --  Net 25 ml   There were no vitals filed for this visit.  Examination:  General exam: Appears calm and comfortable  Respiratory  system: Clear to auscultation. Respiratory effort normal. Cardiovascular system: S1 & S2 heard, RRR. No JVD, murmurs, rubs, gallops or clicks. No pedal edema.    Data Reviewed: I have personally reviewed following labs and imaging studies  CBC: Recent Labs  Lab 01/25/20 0842 01/27/20 1114 01/28/20 1011 01/29/20 0857  WBC 1.9* 1.9* 1.3* 1.4*  NEUTROABS 0.8* 0.7*  --   --   HGB 6.8* 6.1* 8.8* 8.0*  HCT 20.8* 18.6* 25.6* 23.6*  MCV 101.0* 100.5* 93.1 94.0  PLT 10* 12* 23* 14*   Basic Metabolic Panel: Recent Labs  Lab 01/25/20 0842 01/27/20 1114 01/28/20 1011 01/29/20 0857  NA 138 135 137 135  K 4.4 5.4* 4.4 4.2  CL 104 101 100 101  CO2 21* 16* 19* 20*  GLUCOSE 91 88 135* 95  BUN 86* 91* 102* 104*  CREATININE 5.78* 5.59* 5.64* 5.24*  CALCIUM 9.6 8.5* 8.4* 7.9*   GFR: CrCl cannot be calculated (Unknown ideal weight.). Liver Function Tests: Recent Labs  Lab 01/25/20 0842 01/27/20 1114  AST 28 144*  ALT 25 24  ALKPHOS 123 158*  BILITOT 0.9 1.7*  PROT 6.4* 6.1*  ALBUMIN 3.1* 3.1*   No results for input(s): LIPASE, AMYLASE in the last 168 hours. No results for input(s): AMMONIA in the last 168 hours. Coagulation Profile: No results for input(s): INR, PROTIME in the last 168 hours. Cardiac Enzymes: No results for input(s): CKTOTAL, CKMB, CKMBINDEX, TROPONINI in the last 168 hours. BNP (last 3 results) No results for input(s): PROBNP in the last  8760 hours. HbA1C: No results for input(s): HGBA1C in the last 72 hours. CBG: No results for input(s): GLUCAP in the last 168 hours. Lipid Profile: No results for input(s): CHOL, HDL, LDLCALC, TRIG, CHOLHDL, LDLDIRECT in the last 72 hours. Thyroid Function Tests: No results for input(s): TSH, T4TOTAL, FREET4, T3FREE, THYROIDAB in the last 72 hours. Anemia Panel: No results for input(s): VITAMINB12, FOLATE, FERRITIN, TIBC, IRON, RETICCTPCT in the last 72 hours. Sepsis Labs: No results for input(s): PROCALCITON,  LATICACIDVEN in the last 168 hours.  Recent Results (from the past 240 hour(s))  Respiratory Panel by RT PCR (Flu A&B, Covid) - Nasopharyngeal Swab     Status: None   Collection Time: 01/27/20  1:41 PM   Specimen: Nasopharyngeal Swab  Result Value Ref Range Status   SARS Coronavirus 2 by RT PCR NEGATIVE NEGATIVE Final    Comment: (NOTE) SARS-CoV-2 target nucleic acids are NOT DETECTED.  The SARS-CoV-2 RNA is generally detectable in upper respiratoy specimens during the acute phase of infection. The lowest concentration of SARS-CoV-2 viral copies this assay can detect is 131 copies/mL. A negative result does not preclude SARS-Cov-2 infection and should not be used as the sole basis for treatment or other patient management decisions. A negative result may occur with  improper specimen collection/handling, submission of specimen other than nasopharyngeal swab, presence of viral mutation(s) within the areas targeted by this assay, and inadequate number of viral copies (<131 copies/mL). A negative result must be combined with clinical observations, patient history, and epidemiological information. The expected result is Negative.  Fact Sheet for Patients:  PinkCheek.be  Fact Sheet for Healthcare Providers:  GravelBags.it  This test is no t yet approved or cleared by the [place] FDA and  has been authorized for detection and/or diagnosis of SARS-CoV-2 by FDA under an Emergency Use Authorization (EUA). This EUA will remain  in effect (meaning this test can be used) for the duration of the COVID-19 declaration under Section 564(b)(1) of the Act, 21 U.S.C. section 360bbb-3(b)(1), unless the authorization is terminated or revoked sooner.     Influenza A by PCR NEGATIVE NEGATIVE Final   Influenza B by PCR NEGATIVE NEGATIVE Final    Comment: (NOTE) The Xpert Xpress SARS-CoV-2/FLU/RSV assay is intended as an aid in  the  diagnosis of influenza from Nasopharyngeal swab specimens and  should not be used as a sole basis for treatment. Nasal washings and  aspirates are unacceptable for Xpert Xpress SARS-CoV-2/FLU/RSV  testing.  Fact Sheet for Patients: PinkCheek.be  Fact Sheet for Healthcare Providers: GravelBags.it  This test is not yet approved or cleared by the [place] FDA and  has been authorized for detection and/or diagnosis of SARS-CoV-2 by  FDA under an Emergency Use Authorization (EUA). This EUA will remain  in effect (meaning this test can be used) for the duration of the  Covid-19 declaration under Section 564(b)(1) of the Act, 21  U.S.C. section 360bbb-3(b)(1), unless the authorization is  terminated or revoked. Performed at [facility], [address] [place], [place] [address]          Radiology Studies: No results found.      Scheduled Meds: Continuous Infusions: . fentaNYL infusion INTRAVENOUS 100 mcg/hr (01/31/20 0727)     LOS: 1 day    Time spent: 74mins    Kathie Dike, MD [facility]   If 7PM-7AM, please contact night-coverage [email or web address]  01/31/2020, 6:42 PM

## 2020-01-31 NOTE — Progress Notes (Signed)
Daily Progress Note   Patient Name: [name]       Date: 01/31/2020 DOB: [birth year]  Age: 59 y.o. MRN#: [id number] Attending Physician: Kathie Dike, MD Primary Care Physician: Rosita Fire, MD Admit Date: 02/11/2020  Reason for Consultation/Follow-up: Establishing goals of care  Subjective/GOC: Patient appears comfortable on continuous fentanyl infusion. Drowsy but intermittently will open eyes for family at bedside. Accepted bites of applesauce this AM.   Spoke with family at bedside including daughter, [name]. Discussed plan of care, symptom management medications, comfort feeds, and EOL expectations. [name] reiterates her goal to make sure he is comfortable and does not suffer. She does feel he looks much more comfortable on continuous infusion. Also, prn haldol helping with agitation. Emotional/spiriatual support provided. Encouraged [name] to call PMT contact number if questions or concerns this afternoon.   Length of Stay: 1  Current Medications: Scheduled Meds:    Continuous Infusions: . fentaNYL infusion INTRAVENOUS 100 mcg/hr (01/31/20 0727)    PRN Meds: acetaminophen **OR** acetaminophen, antiseptic oral rinse, bisacodyl, fentaNYL, glycopyrrolate **OR** glycopyrrolate **OR** glycopyrrolate, haloperidol **OR** [DISCONTINUED] haloperidol **OR** haloperidol lactate, lactulose, ondansetron **OR** ondansetron (ZOFRAN) IV, polyvinyl alcohol  Physical Exam Vitals and nursing note reviewed.  Constitutional:      Appearance: He is cachectic. He is ill-appearing.  Cardiovascular:     Heart sounds: Normal heart sounds.  Pulmonary:     Effort: No tachypnea, accessory muscle usage or respiratory distress.     Breath sounds: Normal breath sounds.  Abdominal:      Tenderness: There is no abdominal tenderness.  Skin:    General: Skin is warm and dry.  Neurological:     Mental Status: He is easily aroused.     Comments: Drowsy but will intermittently wake for family at bedside. Comfortable  Psychiatric:        Attention and Perception: He is inattentive.        Speech: Speech is delayed.        Cognition and Memory: Cognition is impaired.            Vital Signs: BP (!) 132/94 (BP Location: Right Arm)   Pulse 94   Temp 97.8 F (36.6 C) (Oral)   Resp 18   SpO2 99%  SpO2: SpO2: 99 % O2 Device: O2 Device: Room Air O2 Flow Rate:  Intake/output summary:   Intake/Output Summary (Last 24 hours) at 01/31/2020 1234 Last data filed at 02/27/2020 2114 Gross per 24 hour  Intake 25 ml  Output --  Net 25 ml   LBM:   Baseline Weight:   Most recent weight:         Palliative Assessment/Data: PPS 20%      Patient Active Problem List   Diagnosis Date Noted  . Agitation   . Cancer related pain   . Pancytopenia ([place]) 01/27/2020  . Bone metastases ([place]) 09/19/2019  . Acute kidney injury superimposed on CKD ([place])   . Generalized abdominal pain   . CKD (chronic kidney disease), stage V ([place]) 07/15/2018  . Anemia   . Lower GI bleed   . Multiple myeloma not having achieved remission ([place])   . Goals of care, counseling/discussion   . Terminal care   . Kappa light chain myeloma ([place]) 06/16/2018  . Lumbar epidural mass ([place])   . AKI (acute kidney injury) ([place]) 06/12/2018  . PNA (pneumonia) 06/12/2018  . Hypocalcemia 06/12/2018  . 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 Pulmonary nodules 06/18/2017  . Rectal bleeding 07/09/2016  . Hemorrhoids 07/09/2016  . Chronic left-sided low back pain with left-sided sciatica 06/12/2016  . Left lumbar radiculopathy 06/12/2016  . Aortic atherosclerosis ([place]) 05/28/2016  . Panlobular emphysema ([place]) 05/28/2016  . Cervical disc disease 05/22/2016  . Kidney stones 05/22/2016  . Tobacco abuse 05/22/2016  . BPH  (benign prostatic hyperplasia) 05/22/2016  . H/O: asbestos exposure 05/22/2016  . Substance abuse ([place]) 05/22/2016  . Personality disorder ([place]) 05/22/2016  . Influenza vaccine refused 05/22/2016  . Lumbar stenosis 07/24/2015  . CAROTID ARTERY STENOSIS 04/24/2010  . PVC (premature ventricular contraction) 02/25/2010    Palliative Care Assessment & Plan   Patient Profile: 59 y.o. male  with past medical history of kappa light chain myeloma, CKD stage V, spinal stenosis/lumbar radiculopathy s/p fusion, COPD, former smoker, substance abuse history admitted on 01/27/2020 with weakness and deconditioning. Hospital admission for severe pancytopenia. Recent bone marrow biopsy with 97% plasma cells. Severe pancytopenia from marrow infiltration form myeloma. Evaluated by myeloma specialist Dr. Aris Lot on 01/26/20 and recommendation was for transition to palliative/hospice without therapeutic oncology options and poor prognosis. Dr. Ulice Bold spoke with patient and daughter on 10/31 and decision was made to transition to comfort measures and residential hospice placement. Palliative medicine consultation for terminal care and symptom management.   Assessment: End-stage myeloma Severe pancytopenia Chronic cancer-related pain Anxiety state CKD stage V  Recommendations/Plan: Continue comfort measures. GIP status and waiting on hospice bed.  Continue fentanyl infusion and prn comfort medications on MAR. Comfort feeds per patient/family request. Continue frequent oral care and repositioning. Unrestricted visitor access.  Goals of Care and Additional Recommendations: Limitations on Scope of Treatment: Full Comfort Care  Code Status: DNR   Code Status Orders  (From admission, onward)         Start     Ordered   02/28/2020 1640  Do not attempt resuscitation (DNR)  Continuous       Question Answer Comment  In the event of cardiac or respiratory ARREST Do not call a "code blue"   In the event of  cardiac or respiratory ARREST Do not perform Intubation, CPR, defibrillation or ACLS   In the event of cardiac or respiratory ARREST Use medication by any route, position, wound care, and other measures to relive pain and suffering. May use oxygen, suction and manual treatment of airway obstruction as  needed for comfort.   Comments D/w Pt and his Daughter      02/13/2020 1641        Code Status History    Date Active Date Inactive Code Status Order ID Comments User Context   01/27/2020 1636 02/17/2020 1525 DNR [id number]  Roxan Hockey, MD Inpatient   07/15/2018 2310 07/17/2018 1809 Full Code [id number]  Vianne Bulls, MD ED   06/12/2018 2054 06/23/2018 1915 Full Code [id number]  Phillips Grout, MD ED   07/24/2015 1641 07/26/2015 1555 Full Code [id number]  Leeroy Cha, MD Inpatient   Advance Care Planning Activity      Prognosis:  Weeks-days  Discharge Planning: GIP. Waiting on residential hospice bed  Care plan was discussed with family at bedside including daughter [name]), Dr. Roderic Palau  Thank you for allowing the Palliative Medicine Team to assist in the care of this patient.   Total Time 25 Prolonged Time Billed  no      Greater than 50%  of this time was spent counseling and coordinating care related to the above assessment and plan.  Ihor Dow, DNP, FNP-C Palliative Medicine Team  Phone: [phone] Fax: [phone]  Please contact Palliative Medicine Team phone at [phone] for questions and concerns.

## 2020-02-01 DIAGNOSIS — Z515 Encounter for palliative care: Secondary | ICD-10-CM

## 2020-02-01 DIAGNOSIS — R451 Restlessness and agitation: Secondary | ICD-10-CM

## 2020-02-01 DIAGNOSIS — D61818 Other pancytopenia: Principal | ICD-10-CM

## 2020-02-01 DIAGNOSIS — G893 Neoplasm related pain (acute) (chronic): Secondary | ICD-10-CM

## 2020-02-01 DIAGNOSIS — C9000 Multiple myeloma not having achieved remission: Secondary | ICD-10-CM

## 2020-02-01 MED ORDER — FENTANYL 100 MCG/HR TD PT72
1.0000 | MEDICATED_PATCH | TRANSDERMAL | Status: DC
  Administered 2020-02-01: 1 via TRANSDERMAL
  Filled 2020-02-01: qty 1

## 2020-02-01 MED ORDER — HALOPERIDOL LACTATE 2 MG/ML PO CONC: 2.0000 mg | Freq: Four times a day (QID) | ORAL | Status: DC | PRN

## 2020-02-01 MED ORDER — HYDROMORPHONE HCL 2 MG PO TABS
2.0000 mg | ORAL_TABLET | ORAL | Status: DC | PRN
  Administered 2020-02-01 – 2020-02-02 (×2): 2 mg via ORAL
  Filled 2020-02-01 (×2): qty 1

## 2020-02-01 MED ORDER — ATROPINE SULFATE 1 % OP SOLN: 2.0000 [drp] | Freq: Four times a day (QID) | OPHTHALMIC | Status: DC | PRN

## 2020-02-01 MED ORDER — FENTANYL 2500MCG IN NS 250ML (10MCG/ML) PREMIX INFUSION
50.0000 ug/h | INTRAVENOUS | Status: DC
  Administered 2020-02-01: 50 ug/h via INTRAVENOUS

## 2020-02-01 MED ORDER — HYDROMORPHONE HCL 1 MG/ML PO LIQD: 2.0000 mg | ORAL | Status: DC | PRN

## 2020-02-01 MED ORDER — DIPHENHYDRAMINE HCL 50 MG/ML IJ SOLN
12.5000 mg | Freq: Four times a day (QID) | INTRAMUSCULAR | Status: DC | PRN
  Administered 2020-02-05: 12.5 mg via INTRAVENOUS
  Filled 2020-02-01: qty 1

## 2020-02-01 NOTE — Plan of Care (Signed)

## 2020-02-01 NOTE — Progress Notes (Signed)
Daily Progress Note   Patient Name: [name]       Date: 02/01/2020 DOB: 05-12-1960  Age: 59 y.o. MRN#: [id number] Attending Physician: Orson Eva, MD Primary Care Physician: Rosita Fire, MD Admit Date: 02/17/2020  Reason for Consultation/Follow-up: Establishing goals of care  Subjective: Patient awake and interactive with family. Pleasant confusion but appears comfortable on fentanyl infusion. Tells me family is bringing him pizza this afternoon and he will attempt to eat some.   GOC:  Family at bedside. Daughter, [name] and I discussed plan of care. Waiting on hospice bed. Hospice facility will not accept him with fentanyl infusion, but he appears more comfortable today and wide awake. Will transition to TD fentanyl and have breakthrough pain medication on MAR. Also PO/SL comfort meds. Daughter agreeable with this plan.   Discussed possible surge of energy today since he is wide awake. [name] does report he has been talking to deceased family members. Haldol did relief agitation over night.   Answered questions. Encouraged [name] to call PMT number with questions or concerns this afternoon.   **Discussed plan of care and symptom management medications with hospice liaison.   **Discussed symptom management with pharmacist, Remo Lipps.    Length of Stay: 2  Current Medications: Scheduled Meds:    Continuous Infusions: . fentaNYL infusion INTRAVENOUS 100 mcg/hr (02/01/20 0610)    PRN Meds: acetaminophen **OR** acetaminophen, antiseptic oral rinse, bisacodyl, fentaNYL, glycopyrrolate **OR** glycopyrrolate **OR** glycopyrrolate, haloperidol **OR** [DISCONTINUED] haloperidol **OR** haloperidol lactate, lactulose, ondansetron **OR** ondansetron (ZOFRAN) IV, polyvinyl  alcohol  Physical Exam Vitals and nursing note reviewed.  Constitutional:      Appearance: He is cachectic. He is ill-appearing.  Cardiovascular:     Heart sounds: Normal heart sounds.  Pulmonary:     Effort: No tachypnea, accessory muscle usage or respiratory distress.     Breath sounds: Normal breath sounds.  Abdominal:     Tenderness: There is no abdominal tenderness.  Skin:    General: Skin is warm and dry.  Neurological:     Mental Status: He is easily aroused.     Comments: Awake, confused but comfortable. Interacting with family at bedside.   Psychiatric:        Attention and Perception: He is inattentive.        Speech: Speech is delayed.  Cognition and Memory: Cognition is impaired.            Vital Signs: BP (!) 132/94 (BP Location: Right Arm)   Pulse 94   Temp 97.8 F (36.6 C) (Oral)   Resp 18   SpO2 99%  SpO2: SpO2: 99 % O2 Device: O2 Device: Room Air O2 Flow Rate:    Intake/output summary:  No intake or output data in the 24 hours ending 02/01/20 1253 LBM: Last BM Date: 01/28/20 Baseline Weight:   Most recent weight:         Palliative Assessment/Data: PPS 20%      Patient Active Problem List   Diagnosis Date Noted  . Agitation   . Cancer related pain   . Pancytopenia ([place]) 01/27/2020  . Bone metastases ([place]) 09/19/2019  . Acute kidney injury superimposed on CKD ([place])   . Generalized abdominal pain   . CKD (chronic kidney disease), stage V ([place]) 07/15/2018  . Anemia   . Lower GI bleed   . Multiple myeloma not having achieved remission ([place])   . Goals of care, counseling/discussion   . Palliative care by specialist   . Kappa light chain myeloma ([name]) 06/16/2018  . Lumbar epidural mass ([place])   . AKI (acute kidney injury) ([place]) 06/12/2018  . PNA (pneumonia) 06/12/2018  . Hypocalcemia 06/12/2018  . 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 Pulmonary nodules 06/18/2017  . Rectal bleeding 07/09/2016  . Hemorrhoids 07/09/2016  . Chronic  left-sided low back pain with left-sided sciatica 06/12/2016  . Left lumbar radiculopathy 06/12/2016  . Aortic atherosclerosis ([place]) 05/28/2016  . Panlobular emphysema ([place]) 05/28/2016  . Cervical disc disease 05/22/2016  . Kidney stones 05/22/2016  . Tobacco abuse 05/22/2016  . BPH (benign prostatic hyperplasia) 05/22/2016  . H/O: asbestos exposure 05/22/2016  . Substance abuse ([place]) 05/22/2016  . Personality disorder ([place]) 05/22/2016  . Influenza vaccine refused 05/22/2016  . Lumbar stenosis 07/24/2015  . CAROTID ARTERY STENOSIS 04/24/2010  . PVC (premature ventricular contraction) 02/25/2010    Palliative Care Assessment & Plan   Patient Profile: 59 y.o. male  with past medical history of kappa light chain myeloma, CKD stage V, spinal stenosis/lumbar radiculopathy s/p fusion, COPD, former smoker, substance abuse history admitted on 01/27/2020 with weakness and deconditioning. Hospital admission for severe pancytopenia. Recent bone marrow biopsy with 97% plasma cells. Severe pancytopenia from marrow infiltration form myeloma. Evaluated by myeloma specialist Dr. Aris Lot on 01/26/20 and recommendation was for transition to palliative/hospice without therapeutic oncology options and poor prognosis. Dr. Ulice Bold spoke with patient and daughter on 10/31 and decision was made to transition to comfort measures and residential hospice placement. Palliative medicine consultation for terminal care and symptom management.   Assessment: End-stage myeloma Severe pancytopenia Chronic cancer-related pain Anxiety state CKD stage V  Recommendations/Plan: Continue comfort measures. GIP status and waiting on hospice bed.  Symptom management Discussed with hospice liaison. They will not accept patient at hospice home with fentanyl infusion. Patient is awake, comfortable, and interacting with family.  Start Fentanyl 174mg TD q72h Continue Fentanyl infusion 1016m until 2300. At 2300, decrease fentanyl  continuous infusion to 5065mhr. Will plan to d/c fentanyl infusion in AM if patient appears comfortable.  Dilaudid 2mg16m q3h prn breakthrough pain/dyspnea/air hunger Atropine SL QID prn secretions.  Haldol IV or PO prn agitation Comfort feeds per patient/family request. Continue frequent oral care and repositioning. Unrestricted visitor access.  Goals of Care and Additional Recommendations: Limitations on Scope of Treatment: Full Comfort Care  Code  Status: DNR   Code Status Orders  (From admission, onward)         Start     Ordered   02/04/2020 1640  Do not attempt resuscitation (DNR)  Continuous       Question Answer Comment  In the event of cardiac or respiratory ARREST Do not call a "code blue"   In the event of cardiac or respiratory ARREST Do not perform Intubation, CPR, defibrillation or ACLS   In the event of cardiac or respiratory ARREST Use medication by any route, position, wound care, and other measures to relive pain and suffering. May use oxygen, suction and manual treatment of airway obstruction as needed for comfort.   Comments D/w Pt and his Daughter      02/13/2020 1641        Code Status History    Date Active Date Inactive Code Status Order ID Comments User Context   01/27/2020 1636 02/20/2020 1525 DNR [id number]  Roxan Hockey, MD Inpatient   07/15/2018 2310 07/17/2018 1809 Full Code [id number]  Vianne Bulls, MD ED   06/12/2018 2054 06/23/2018 1915 Full Code [id number]  Phillips Grout, MD ED   07/24/2015 1641 07/26/2015 1555 Full Code [id number]  Leeroy Cha, MD Inpatient   Advance Care Planning Activity      Prognosis:  Weeks-days  Discharge Planning: GIP. Waiting on residential hospice bed  Care plan was discussed with family at bedside including daughter [name]), hospice liaison, pharmacy, updated Dr. Carles Collet via secure chat.  Thank you for allowing the Palliative Medicine Team to assist in the care of this patient.   Total Time 40 Prolonged Time  Billed  no    Greater than 50% of this time was spent counseling and coordinating care related to the above assessment and plan.   Ihor Dow, DNP, FNP-C Palliative Medicine Team  Phone: [phone] Fax: [phone]  Please contact Palliative Medicine Team phone at [phone] for questions and concerns.

## 2020-02-01 NOTE — Progress Notes (Signed)
PROGRESS NOTE  [name] DJM:[id number] DOB: 1960-04-06 DOA: 02/26/2020 PCP: Rosita Fire, MD  Brief History:  59 y.o. male with medical history significant of end-stage myeloma, associated pancytopenia, generalized failure to thrive, chronic pain from underlying malignancy, admitted to the hospital with severe cytopenias including thrombocytopenia and anemia requiring transfusion of PRBC and platelets.  He was continued on pain management.  He was recently [facility] at [facility] by oncology and was not felt to be a candidate for any further treatments.  After seeing palliative care, family elected to pursue comfort measures and has been transition to comfort care.  He is currently on fentanyl infusion for pain management and respiratory distress.  He is waiting a bed at residential hospice.  Assessment/Plan: Severe Pancytopenia/Myeloma -presented with WBC 1.9, Hgb 6.1 and platelets 12K -01/10/20 bone marrow--97% plasma cells with unfavorable cytogenetics -Evaluated on 01/26/20 bymyeloma specialist Dr.-Brandon Percell Miller Lambird--recommended transition to palliative care and hospice patient does not have any good therapeutic options -has been transfused with PRBC and platelets -no further blood draws as focus of care is comfort directed  CKD 5/hyperkalemia -baseline creatinine 5.6-5.8 -no further blood draws as focus of care is comfort directed  LumbarRadiculopathy---- -Spinal Stenosis/lumbar radiculopathy status post fusion(initially in 2012 and then again in 07/24/2015)----  MRI L-spine--diffusely abnormal bone marrow signal concerning for myeloproliferative disorder versus infiltrative metastatic disease. Abnormal prevertebraltissue as discussed above at L2-3 -3/16 MR T-spine--no acute findings,neurosurgery, Dr. Melynda Keller nonoperative managementfor radiculopathy -MRI of the thoracic and lumbar spine on 10/06/2019 shows T7, T10, T11 and T12  lesions are slightly better seen on the study as the prior study was degraded by motion artifact. No new findings. Overall grossly stable appearing myelomatous changes at T12, L2 and L3 with no progressive findings. Right seventh and ninth posterior rib lesions are probably stable. Right seventh posterior rib lesion may be larger or just better seen. There does appear to be a new adjacent soft tissue component.  COPD -stable on RA -BDs as needed  Chronic pain/neoplastic related pain -transition from IV fentanyl to TD with prn breakthrough meds  Goals of Care/Social -see by palliative medicine -after goals of care discussion, patient has been transitioned to full comfort care -now GIP whil waiting for residential hospice      Family Communication:   Family at bedside updated 11/3  Consultants:  Palliative medicine  Code Status:  FULL COMFORT  DVT Prophylaxis:  FULL COMFORT      Subjective: Patient is pleasantly confused.  He complains of some intermittent cp and sob particular with movement.  Denies n/v/d, headache  Objective: Vitals:   02/12/2020 1700 01/31/20 0551 02/01/20 1300 02/01/20 1507  BP:  (!) 132/94 (!) 110/56 (!) 113/59  Pulse:  94 (!) 108 (!) 105  Resp:  18 16 15   Temp: 99.1 F (37.3 C) 97.8 F (36.6 C) 98.7 F (37.1 C) 99.3 F (37.4 C)  TempSrc: Axillary Oral Oral Oral  SpO2:  99% 99% 99%    Intake/Output Summary (Last 24 hours) at 02/01/2020 1725 Last data filed at 02/01/2020 1706 Gross per 24 hour  Intake --  Output 350 ml  Net -350 ml   Weight change:  Exam:   General:  Pt is alert, follows commands appropriately, not in acute distress  HEENT: No icterus, No thrush, No neck mass, [place]/AT  Cardiovascular: RRR, S1/S2, no rubs, no gallops  Respiratory: bibasilar rales. No wheeze  Abdomen: Soft/+BS, non tender,  non distended, no guarding  Extremities: No edema, No lymphangitis, No petechiae, No rashes, no synovitis   Data Reviewed: I  have personally reviewed following labs and imaging studies Basic Metabolic Panel: Recent Labs  Lab 01/27/20 1114 01/28/20 1011 01/29/20 0857  NA 135 137 135  K 5.4* 4.4 4.2  CL 101 100 101  CO2 16* 19* 20*  GLUCOSE 88 135* 95  BUN 91* 102* 104*  CREATININE 5.59* 5.64* 5.24*  CALCIUM 8.5* 8.4* 7.9*   Liver Function Tests: Recent Labs  Lab 01/27/20 1114  AST 144*  ALT 24  ALKPHOS 158*  BILITOT 1.7*  PROT 6.1*  ALBUMIN 3.1*   No results for input(s): LIPASE, AMYLASE in the last 168 hours. No results for input(s): AMMONIA in the last 168 hours. Coagulation Profile: No results for input(s): INR, PROTIME in the last 168 hours. CBC: Recent Labs  Lab 01/27/20 1114 01/28/20 1011 01/29/20 0857  WBC 1.9* 1.3* 1.4*  NEUTROABS 0.7*  --   --   HGB 6.1* 8.8* 8.0*  HCT 18.6* 25.6* 23.6*  MCV 100.5* 93.1 94.0  PLT 12* 23* 14*   Cardiac Enzymes: No results for input(s): CKTOTAL, CKMB, CKMBINDEX, TROPONINI in the last 168 hours. BNP: Invalid input(s): POCBNP CBG: No results for input(s): GLUCAP in the last 168 hours. HbA1C: No results for input(s): HGBA1C in the last 72 hours. Urine analysis:    Component Value Date/Time   COLORURINE STRAW (A) 07/15/2018 2033   APPEARANCEUR CLEAR 07/15/2018 2033   LABSPEC 1.006 07/15/2018 2033   PHURINE 5.0 07/15/2018 2033   GLUCOSEU NEGATIVE 07/15/2018 2033   HGBUR SMALL (A) 07/15/2018 2033   BILIRUBINUR NEGATIVE 07/15/2018 2033   KETONESUR NEGATIVE 07/15/2018 2033   PROTEINUR NEGATIVE 07/15/2018 2033   UROBILINOGEN 0.2 02/09/2008 2042   NITRITE NEGATIVE 07/15/2018 2033   LEUKOCYTESUR NEGATIVE 07/15/2018 2033   Sepsis Labs: @LABRCNTIP (procalcitonin:4,lacticidven:4) ) Recent Results (from the past 240 hour(s))  Respiratory Panel by RT PCR (Flu A&B, Covid) - Nasopharyngeal Swab     Status: None   Collection Time: 01/27/20  1:41 PM   Specimen: Nasopharyngeal Swab  Result Value Ref Range Status   SARS Coronavirus 2 by RT PCR  NEGATIVE NEGATIVE Final    Comment: (NOTE) SARS-CoV-2 target nucleic acids are NOT DETECTED.  The SARS-CoV-2 RNA is generally detectable in upper respiratoy specimens during the acute phase of infection. The lowest concentration of SARS-CoV-2 viral copies this assay can detect is 131 copies/mL. A negative result does not preclude SARS-Cov-2 infection and should not be used as the sole basis for treatment or other patient management decisions. A negative result may occur with  improper specimen collection/handling, submission of specimen other than nasopharyngeal swab, presence of viral mutation(s) within the areas targeted by this assay, and inadequate number of viral copies (<131 copies/mL). A negative result must be combined with clinical observations, patient history, and epidemiological information. The expected result is Negative.  Fact Sheet for Patients:  PinkCheek.be  Fact Sheet for Healthcare Providers:  GravelBags.it  This test is no t yet approved or cleared by the [place] FDA and  has been authorized for detection and/or diagnosis of SARS-CoV-2 by FDA under an Emergency Use Authorization (EUA). This EUA will remain  in effect (meaning this test can be used) for the duration of the COVID-19 declaration under Section 564(b)(1) of the Act, 21 U.S.C. section 360bbb-3(b)(1), unless the authorization is terminated or revoked sooner.     Influenza A by PCR NEGATIVE NEGATIVE Final  Influenza B by PCR NEGATIVE NEGATIVE Final    Comment: (NOTE) The Xpert Xpress SARS-CoV-2/FLU/RSV assay is intended as an aid in  the diagnosis of influenza from Nasopharyngeal swab specimens and  should not be used as a sole basis for treatment. Nasal washings and  aspirates are unacceptable for Xpert Xpress SARS-CoV-2/FLU/RSV  testing.  Fact Sheet for Patients: PinkCheek.be  Fact Sheet for  Healthcare Providers: GravelBags.it  This test is not yet approved or cleared by the [place] FDA and  has been authorized for detection and/or diagnosis of SARS-CoV-2 by  FDA under an Emergency Use Authorization (EUA). This EUA will remain  in effect (meaning this test can be used) for the duration of the  Covid-19 declaration under Section 564(b)(1) of the Act, 21  U.S.C. section 360bbb-3(b)(1), unless the authorization is  terminated or revoked. Performed at [facility], [address] [place], [place] [address]      Scheduled Meds: . fentaNYL  1 patch Transdermal Q72H   Continuous Infusions: . fentaNYL infusion INTRAVENOUS 100 mcg/hr (02/01/20 0610)  . fentaNYL infusion INTRAVENOUS      Procedures/Studies: No results found.  Orson Eva, DO  [facility]  If 7PM-7AM, please contact night-coverage [email or web address] Password [email or web address] 02/01/2020, 5:25 PM   LOS: 2 days

## 2020-02-01 NOTE — TOC Progression Note (Signed)
Transition of Care [facility]) - Progression Note    Patient Details  Name: [name] MRN: [id number] Date of Birth: 1960-05-25  Transition of Care [facility] CM/SW Contact  Salome Arnt, [place] Phone Number: 02/01/2020, 3:07 PM  Clinical Narrative:  Pt is GIP. LCSW spoke with Hospice this morning. No beds available today. TOC will continue to follow.          Expected Discharge Plan and Services                                                 Social Determinants of Health (SDOH) Interventions    Readmission Risk Interventions Readmission Risk Prevention Plan 07/16/2018 07/16/2018 06/14/2018  Transportation Screening Complete Complete Complete  Social Work Consult for [place] Planning/Counseling - - Complete  PCP or Specialist appointment within 3-5 days of discharge Complete - -  [place] or Home Care Consult Complete - -  Palliative Care Screening Not Applicable - -  [place] Not Applicable - -  Some recent data might be hidden

## 2020-02-02 MED ORDER — SENNA 8.6 MG PO TABS
1.0000 | ORAL_TABLET | Freq: Every day | ORAL | Status: DC
  Administered 2020-02-02 – 2020-02-03 (×2): 8.6 mg via ORAL
  Filled 2020-02-02 (×4): qty 1

## 2020-02-02 MED ORDER — HYDROMORPHONE HCL 2 MG PO TABS
2.0000 mg | ORAL_TABLET | ORAL | Status: DC | PRN
  Administered 2020-02-02 (×2): 4 mg via ORAL
  Filled 2020-02-02 (×2): qty 2

## 2020-02-02 MED ORDER — SENNOSIDES 8.8 MG/5ML PO SYRP
10.0000 mL | ORAL_SOLUTION | Freq: Every day | ORAL | Status: DC
  Filled 2020-02-02 (×2): qty 10

## 2020-02-02 MED ORDER — FENTANYL CITRATE (PF) 100 MCG/2ML IJ SOLN
100.0000 ug | INTRAMUSCULAR | Status: DC | PRN
  Administered 2020-02-02 – 2020-02-05 (×23): 100 ug via INTRAVENOUS
  Filled 2020-02-02 (×25): qty 2

## 2020-02-02 MED ORDER — LORAZEPAM 2 MG/ML IJ SOLN
1.0000 mg | INTRAMUSCULAR | Status: DC | PRN
  Administered 2020-02-02 – 2020-02-05 (×5): 1 mg via INTRAVENOUS
  Filled 2020-02-02 (×6): qty 1

## 2020-02-02 NOTE — TOC Progression Note (Signed)
Transition of Care [facility]) - Progression Note    Patient Details  Name: [name] MRN: [id number] Date of Birth: 02-10-1961  Transition of Care [facility]) CM/SW Contact  Natasha Bence, LCSW Phone Number: 02/02/2020, 2:53 PM  Clinical Narrative:    CSW contacted Pia Mau with [facility] for bed availability. Colletta [place] reported that patient is next on the list, but no beds are available at this time. TOC to follow.        Expected Discharge Plan and Services                                                 Social Determinants of Health (SDOH) Interventions    Readmission Risk Interventions Readmission Risk Prevention Plan 07/16/2018 07/16/2018 06/14/2018  Transportation Screening Complete Complete Complete  Social Work Consult for [place] Planning/Counseling - - Complete  PCP or Specialist appointment within 3-5 days of discharge Complete - -  [place] or Home Care Consult Complete - -  Palliative Care Screening Not Applicable - -  [place] Not Applicable - -  Some recent data might be hidden

## 2020-02-02 NOTE — Progress Notes (Signed)
Daily Progress Note   Patient Name: [name]       Date: 02/02/2020 DOB: 06/07/1960  Age: 59 y.o. MRN#: [id number] Attending Physician: Orson Eva, MD Primary Care Physician: Rosita Fire, MD Admit Date: 02/09/2020  Reason for Consultation/Follow-up: Establishing goals of care  Subjective: This AM, patient is awake and remains with pleasant confusion. No family at bedside.   C/o of generalized pain "all over." RN recently gave PO dilaudid. This NP bolused fentanyl 42mg via infusion for breakthrough pain.   Waiting on hospice facility bed.   Length of Stay: 3  Current Medications: Scheduled Meds:  . fentaNYL  1 patch Transdermal Q72H  . sennosides  10 mL Oral Daily    Continuous Infusions:   PRN Meds: acetaminophen **OR** acetaminophen, antiseptic oral rinse, atropine, bisacodyl, diphenhydrAMINE, fentaNYL (SUBLIMAZE) injection, glycopyrrolate **OR** glycopyrrolate **OR** glycopyrrolate, haloperidol, haloperidol **OR** [DISCONTINUED] haloperidol **OR** haloperidol lactate, HYDROmorphone, lactulose, ondansetron **OR** ondansetron (ZOFRAN) IV, polyvinyl alcohol  Physical Exam Vitals and nursing note reviewed.  Constitutional:      Appearance: He is cachectic. He is ill-appearing.  Cardiovascular:     Heart sounds: Normal heart sounds.  Pulmonary:     Effort: No tachypnea, accessory muscle usage or respiratory distress.     Breath sounds: Normal breath sounds.  Abdominal:     Tenderness: There is no abdominal tenderness.  Skin:    General: Skin is warm and dry.  Neurological:     Mental Status: He is easily aroused.     Comments: Awake, pleasant confusion            Vital Signs: BP (!) 113/59 (BP Location: Left Arm)   Pulse (!) 105   Temp 99.3 F (37.4 C) (Oral)    Resp 15   SpO2 99%  SpO2: SpO2: 99 % O2 Device: O2 Device: Room Air O2 Flow Rate:    Intake/output summary:   Intake/Output Summary (Last 24 hours) at 02/02/2020 0831 Last data filed at 02/02/2020 0523 Gross per 24 hour  Intake --  Output 650 ml  Net -650 ml   LBM: Last BM Date: 02/01/20 Baseline Weight:   Most recent weight:         Palliative Assessment/Data: PPS 20%      Patient Active Problem List   Diagnosis Date Noted  . End of  life care 02/01/2020  . Agitation   . Cancer related pain   . Pancytopenia ([place]) 01/27/2020  . Bone metastases ([place]) 09/19/2019  . Acute kidney injury superimposed on CKD ([place])   . Generalized abdominal pain   . CKD (chronic kidney disease), stage V ([place]) 07/15/2018  . Anemia   . Lower GI bleed   . Multiple myeloma not having achieved remission ([place])   . Goals of care, counseling/discussion   . Palliative care by specialist   . Kappa light chain myeloma ([place]) 06/16/2018  . Lumbar epidural mass ([place])   . AKI (acute kidney injury) ([place]) 06/12/2018  . PNA (pneumonia) 06/12/2018  . Hypocalcemia 06/12/2018  . 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 Pulmonary nodules 06/18/2017  . Rectal bleeding 07/09/2016  . Hemorrhoids 07/09/2016  . Chronic left-sided low back pain with left-sided sciatica 06/12/2016  . Left lumbar radiculopathy 06/12/2016  . Aortic atherosclerosis ([place]) 05/28/2016  . Panlobular emphysema ([place]) 05/28/2016  . Cervical disc disease 05/22/2016  . Kidney stones 05/22/2016  . Tobacco abuse 05/22/2016  . BPH (benign prostatic hyperplasia) 05/22/2016  . H/O: asbestos exposure 05/22/2016  . Substance abuse ([place]) 05/22/2016  . Personality disorder ([place]) 05/22/2016  . Influenza vaccine refused 05/22/2016  . Lumbar stenosis 07/24/2015  . CAROTID ARTERY STENOSIS 04/24/2010  . PVC (premature ventricular contraction) 02/25/2010    Palliative Care Assessment & Plan   Patient Profile: 59 y.o. male  with past medical history of  kappa light chain myeloma, CKD stage V, spinal stenosis/lumbar radiculopathy s/p fusion, COPD, former smoker, substance abuse history admitted on 01/27/2020 with weakness and deconditioning. Hospital admission for severe pancytopenia. Recent bone marrow biopsy with 97% plasma cells. Severe pancytopenia from marrow infiltration form myeloma. Evaluated by myeloma specialist Dr. Aris Lot on 01/26/20 and recommendation was for transition to palliative/hospice without therapeutic oncology options and poor prognosis. Dr. Ulice Bold spoke with patient and daughter on 10/31 and decision was made to transition to comfort measures and residential hospice placement. Palliative medicine consultation for terminal care and symptom management.   Assessment: End-stage myeloma Severe pancytopenia Chronic cancer-related pain Anxiety state CKD stage V  Recommendations/Plan: Continue comfort measures. GIP status and waiting on hospice bed.  Symptom management Discussed with hospice liaison. They will not accept patient at hospice home with fentanyl infusion. Fentanyl 142mg TD q72h started 02/01/20. Discontinue Fentanyl infusion. Fentanyl 1033m IV q1h prn breakthrough pain/dyspnea/air hunger/tachypnea Dilaudid 2-3m47mO q3h prn breakthrough pain/dyspnea/air hunger Atropine SL QID prn secretions.  Haldol IV or PO prn agitation Senna scheduled Comfort feeds per patient/family request. Continue frequent oral care and repositioning. Unrestricted visitor access.  Goals of Care and Additional Recommendations: Limitations on Scope of Treatment: Full Comfort Care  Code Status: DNR   Code Status Orders  (From admission, onward)         Start     Ordered   02/22/2020 1640  Do not attempt resuscitation (DNR)  Continuous       Question Answer Comment  In the event of cardiac or respiratory ARREST Do not call a "code blue"   In the event of cardiac or respiratory ARREST Do not perform Intubation, CPR, defibrillation or  ACLS   In the event of cardiac or respiratory ARREST Use medication by any route, position, wound care, and other measures to relive pain and suffering. May use oxygen, suction and manual treatment of airway obstruction as needed for comfort.   Comments D/w Pt and his Daughter      02/04/2020 1641  Code Status History    Date Active Date Inactive Code Status Order ID Comments User Context   01/27/2020 1636 02/12/2020 1525 DNR [id number]  Roxan Hockey, MD Inpatient   07/15/2018 2310 07/17/2018 1809 Full Code [id number]  Vianne Bulls, MD ED   06/12/2018 2054 06/23/2018 1915 Full Code [id number]  Phillips Grout, MD ED   07/24/2015 1641 07/26/2015 1555 Full Code [id number]  Leeroy Cha, MD Inpatient   Advance Care Planning Activity      Prognosis:  Weeks-days  Discharge Planning: GIP. Waiting on residential hospice bed  Care plan was discussed with RN, will update daughter when she arrives to bedside this afternoon.  Thank you for allowing the Palliative Medicine Team to assist in the care of this patient.   Total Time 20 Prolonged Time Billed  no    Greater than 50% of this time was spent counseling and coordinating care related to the above assessment and plan.   Ihor Dow, DNP, FNP-C Palliative Medicine Team  Phone: [phone] Fax: [phone]  Please contact Palliative Medicine Team phone at [phone] for questions and concerns.

## 2020-02-02 NOTE — Plan of Care (Signed)
  Problem: Education: Goal: Knowledge of General Education information will improve Description: Including pain rating scale, medication(s)/side effects and non-pharmacologic comfort measures 02/02/2020 0213 by Baldemar Friday, RN Outcome: Progressing 02/02/2020 0210 by Baldemar Friday, RN Outcome: Progressing   Problem: Health Behavior/Discharge Planning: Goal: Ability to manage health-related needs will improve 02/02/2020 0213 by Baldemar Friday, RN Outcome: Progressing 02/02/2020 0210 by Baldemar Friday, RN Outcome: Progressing   Problem: Clinical Measurements: Goal: Ability to maintain clinical measurements within normal limits will improve 02/02/2020 0213 by Baldemar Friday, RN Outcome: Progressing 02/02/2020 0210 by Baldemar Friday, RN Outcome: Progressing Goal: Will remain free from infection 02/02/2020 0213 by Baldemar Friday, RN Outcome: Progressing 02/02/2020 0210 by Baldemar Friday, RN Outcome: Progressing Goal: Diagnostic test results will improve 02/02/2020 0213 by Baldemar Friday, RN Outcome: Progressing 02/02/2020 0210 by Baldemar Friday, RN Outcome: Progressing Goal: Respiratory complications will improve 02/02/2020 0213 by Baldemar Friday, RN Outcome: Progressing 02/02/2020 0210 by Baldemar Friday, RN Outcome: Progressing Goal: Cardiovascular complication will be avoided 02/02/2020 0213 by Baldemar Friday, RN Outcome: Progressing 02/02/2020 0210 by Baldemar Friday, RN Outcome: Progressing   Problem: Activity: Goal: Risk for activity intolerance will decrease 02/02/2020 0213 by Baldemar Friday, RN Outcome: Progressing 02/02/2020 0210 by Baldemar Friday, RN Outcome: Progressing   Problem: Nutrition: Goal: Adequate nutrition will be maintained 02/02/2020 0213 by Baldemar Friday, RN Outcome: Progressing 02/02/2020 0210 by Baldemar Friday, RN Outcome: Progressing   Problem: Coping: Goal: Level of anxiety will decrease 02/02/2020 0213 by Baldemar Friday, RN Outcome:  Progressing 02/02/2020 0210 by Baldemar Friday, RN Outcome: Progressing   Problem: Elimination: Goal: Will not experience complications related to bowel motility 02/02/2020 0213 by Baldemar Friday, RN Outcome: Progressing 02/02/2020 0210 by Baldemar Friday, RN Outcome: Progressing Goal: Will not experience complications related to urinary retention 02/02/2020 0213 by Baldemar Friday, RN Outcome: Progressing 02/02/2020 0210 by Baldemar Friday, RN Outcome: Progressing   Problem: Pain Managment: Goal: General experience of comfort will improve 02/02/2020 0213 by Baldemar Friday, RN Outcome: Progressing 02/02/2020 0210 by Baldemar Friday, RN Outcome: Progressing   Problem: Safety: Goal: Ability to remain free from injury will improve 02/02/2020 0213 by Baldemar Friday, RN Outcome: Progressing 02/02/2020 0210 by Baldemar Friday, RN Outcome: Progressing   Problem: Skin Integrity: Goal: Risk for impaired skin integrity will decrease 02/02/2020 0213 by Baldemar Friday, RN Outcome: Progressing 02/02/2020 0210 by Baldemar Friday, RN Outcome: Progressing

## 2020-02-02 NOTE — Plan of Care (Signed)

## 2020-02-02 NOTE — Progress Notes (Signed)
PROGRESS NOTE  Abrar Bilton EOF:[id number] DOB: [birth year] DOA: 01/31/2020 PCP: Rosita Fire, MD   Brief History:  59 y.o.malewith medical history significant ofend-stage myeloma, associated pancytopenia, generalized failure to thrive, chronic pain from underlying malignancy, admitted to the hospital with severe cytopenias including thrombocytopenia and anemia requiring transfusion of PRBC and platelets. He was continued on pain management. He was recently seen at [facility] by oncology and was not felt to be a candidate for any further treatments. After seeing palliative care, family elected to pursue comfort measures and has been transition to comfort care. He is currently on fentanyl infusion for pain management and respiratory distress. He is waiting a bed at residential hospice.  Assessment/Plan: Severe Pancytopenia/Myeloma -presented with WBC 1.9, Hgb 6.1 and platelets 12K -01/10/20 bone marrow--97% plasma cells with unfavorable cytogenetics -Evaluated on 01/26/20 bymyeloma specialist Dr.-Jonathan Percell Miller Lambird--recommended transition to palliative care and hospice patient does not have any good therapeutic options -has been transfused with PRBC and platelets -no further blood draws as focus of care is comfort directed  CKD 5/hyperkalemia -baseline creatinine 5.6-5.8 -no further blood draws as focus of care is comfort directed  LumbarRadiculopathy---- -Spinal Stenosis/lumbar radiculopathy status post fusion(initially in 2012 and then again in4/25/2017)---- MRI L-spine--diffusely abnormal bone marrow signal concerning for myeloproliferative disorder versus infiltrative metastatic disease. Abnormal prevertebraltissue as discussed above at L2-3 -3/16 MR T-spine--no acute findings,neurosurgery, Dr. Melynda Keller nonoperative managementfor radiculopathy -MRI of the thoracic and lumbar spine on 10/06/2019 shows T7, T10, T11 and T12  lesions are slightly better seen on the study as the prior study was degraded by motion artifact. No new findings. Overall grossly stable appearing myelomatous changes at T12, L2 and L3 with no progressive findings. Right seventh and ninth posterior rib lesions are probably stable. Right seventh posterior rib lesion may be larger or just better seen. There does appear to be a new adjacent soft tissue component.  COPD -stable on RA -BDs as needed  Chronic pain/neoplastic related pain -transition from IV fentanyl to TD with prn breakthrough meds  Goals of Care/Social -see by palliative medicine -after goals of care discussion, patient has been transitioned to full comfort care -now GIP whil waiting for residential hospice -add ativan prn agitation -continue fentanyl TD and IV for breakthrough pain -dilaudid po prn breakthrough pain     Family Communication:   Daughter at bedside updated 11/4  Consultants:  Palliative medicine  Code Status:  FULL COMFORT  DVT Prophylaxis:  FULL COMFORT   Subjective: Patient is pleasantly confused.  Complains of butt pain.  Denies sob, n/v/d, abd pain.  Having occasional chest pain.  Objective: Vitals:   02/01/20 1300 02/01/20 1507 02/01/20 1742 02/02/20 0828  BP: (!) 110/56 (!) 113/59  106/62  Pulse: (!) 108 (!) 105  96  Resp: 16 15  19   Temp: 98.7 F (37.1 C) 99.3 F (37.4 C)  98.4 F (36.9 C)  TempSrc: Oral Oral  Oral  SpO2: 99% 99% 99% 100%    Intake/Output Summary (Last 24 hours) at 02/02/2020 1648 Last data filed at 02/02/2020 1253 Gross per 24 hour  Intake --  Output 1150 ml  Net -1150 ml   Weight change:  Exam:   General:  Pt is alert, follows commands appropriately, not in acute distress  HEENT: No icterus, No thrush, No neck mass, [place]/AT  Cardiovascular: RRR, S1/S2, no rubs, no gallops  Respiratory: CTA bilaterally, no wheezing, no crackles, no rhonchi  Abdomen: Soft/+BS, non tender, non distended,  no guarding  Extremities: No edema, No lymphangitis, No petechiae, No rashes, no synovitis   Data Reviewed: I have personally reviewed following labs and imaging studies Basic Metabolic Panel: Recent Labs  Lab 01/27/20 1114 01/28/20 1011 01/29/20 0857  NA 135 137 135  K 5.4* 4.4 4.2  CL 101 100 101  CO2 16* 19* 20*  GLUCOSE 88 135* 95  BUN 91* 102* 104*  CREATININE 5.59* 5.64* 5.24*  CALCIUM 8.5* 8.4* 7.9*   Liver Function Tests: Recent Labs  Lab 01/27/20 1114  AST 144*  ALT 24  ALKPHOS 158*  BILITOT 1.7*  PROT 6.1*  ALBUMIN 3.1*   No results for input(s): LIPASE, AMYLASE in the last 168 hours. No results for input(s): AMMONIA in the last 168 hours. Coagulation Profile: No results for input(s): INR, PROTIME in the last 168 hours. CBC: Recent Labs  Lab 01/27/20 1114 01/28/20 1011 01/29/20 0857  WBC 1.9* 1.3* 1.4*  NEUTROABS 0.7*  --   --   HGB 6.1* 8.8* 8.0*  HCT 18.6* 25.6* 23.6*  MCV 100.5* 93.1 94.0  PLT 12* 23* 14*   Cardiac Enzymes: No results for input(s): CKTOTAL, CKMB, CKMBINDEX, TROPONINI in the last 168 hours. BNP: Invalid input(s): POCBNP CBG: No results for input(s): GLUCAP in the last 168 hours. HbA1C: No results for input(s): HGBA1C in the last 72 hours. Urine analysis:    Component Value Date/Time   COLORURINE STRAW (A) 07/15/2018 2033   APPEARANCEUR CLEAR 07/15/2018 2033   LABSPEC 1.006 07/15/2018 2033   PHURINE 5.0 07/15/2018 2033   GLUCOSEU NEGATIVE 07/15/2018 2033   HGBUR SMALL (A) 07/15/2018 2033   BILIRUBINUR NEGATIVE 07/15/2018 2033   KETONESUR NEGATIVE 07/15/2018 2033   PROTEINUR NEGATIVE 07/15/2018 2033   UROBILINOGEN 0.2 02/09/2008 2042   NITRITE NEGATIVE 07/15/2018 2033   LEUKOCYTESUR NEGATIVE 07/15/2018 2033   Sepsis Labs: @LABRCNTIP (procalcitonin:4,lacticidven:4) ) Recent Results (from the past 240 hour(s))  Respiratory Panel by RT PCR (Flu A&B, Covid) - Nasopharyngeal Swab     Status: None   Collection Time:  01/27/20  1:41 PM   Specimen: Nasopharyngeal Swab  Result Value Ref Range Status   SARS Coronavirus 2 by RT PCR NEGATIVE NEGATIVE Final    Comment: (NOTE) SARS-CoV-2 target nucleic acids are NOT DETECTED.  The SARS-CoV-2 RNA is generally detectable in upper respiratoy specimens during the acute phase of infection. The lowest concentration of SARS-CoV-2 viral copies this assay can detect is 131 copies/mL. A negative result does not preclude SARS-Cov-2 infection and should not be used as the sole basis for treatment or other patient management decisions. A negative result may occur with  improper specimen collection/handling, submission of specimen other than nasopharyngeal swab, presence of viral mutation(s) within the areas targeted by this assay, and inadequate number of viral copies (<131 copies/mL). A negative result must be combined with clinical observations, patient history, and epidemiological information. The expected result is Negative.  Fact Sheet for Patients:  PinkCheek.be  Fact Sheet for Healthcare Providers:  GravelBags.it  This test is no t yet approved or cleared by the [place] FDA and  has been authorized for detection and/or diagnosis of SARS-CoV-2 by FDA under an Emergency Use Authorization (EUA). This EUA will remain  in effect (meaning this test can be used) for the duration of the COVID-19 declaration under Section 564(b)(1) of the Act, 21 U.S.C. section 360bbb-3(b)(1), unless the authorization is terminated or revoked sooner.     Influenza A by  PCR NEGATIVE NEGATIVE Final   Influenza B by PCR NEGATIVE NEGATIVE Final    Comment: (NOTE) The Xpert Xpress SARS-CoV-2/FLU/RSV assay is intended as an aid in  the diagnosis of influenza from Nasopharyngeal swab specimens and  should not be used as a sole basis for treatment. Nasal washings and  aspirates are unacceptable for Xpert Xpress  SARS-CoV-2/FLU/RSV  testing.  Fact Sheet for Patients: PinkCheek.be  Fact Sheet for Healthcare Providers: GravelBags.it  This test is not yet approved or cleared by the [place] FDA and  has been authorized for detection and/or diagnosis of SARS-CoV-2 by  FDA under an Emergency Use Authorization (EUA). This EUA will remain  in effect (meaning this test can be used) for the duration of the  Covid-19 declaration under Section 564(b)(1) of the Act, 21  U.S.C. section 360bbb-3(b)(1), unless the authorization is  terminated or revoked. Performed at [facility], [address] [place], [place] [address]      Scheduled Meds: . fentaNYL  1 patch Transdermal Q72H  . senna  1 tablet Oral Daily   Continuous Infusions:  Procedures/Studies: No results found.  Orson Eva, DO  [facility]  If 7PM-7AM, please contact night-coverage [email or web address] Password [email or web address] 02/02/2020, 4:48 PM   LOS: 3 days

## 2020-02-03 MED ORDER — FENTANYL 75 MCG/HR TD PT72
1.0000 | MEDICATED_PATCH | TRANSDERMAL | Status: DC
  Administered 2020-02-03: 1 via TRANSDERMAL
  Filled 2020-02-03: qty 1

## 2020-02-03 MED ORDER — METHOCARBAMOL 1000 MG/10ML IJ SOLN
500.0000 mg | Freq: Three times a day (TID) | INTRAVENOUS | Status: DC
  Administered 2020-02-03 – 2020-02-06 (×4): 500 mg via INTRAVENOUS
  Filled 2020-02-03 (×2): qty 5

## 2020-02-03 NOTE — Progress Notes (Signed)
PROGRESS NOTE  [name] [id number]:[id number] DOB: Apr 28, 1960 DOA: 02/21/2020 PCP: Rosita Fire, MD  Brief History: 59 y.o.malewith medical history significant ofend-stage myeloma, associated pancytopenia, generalized failure to thrive, chronic pain from underlying malignancy, admitted to the hospital with severe cytopenias including thrombocytopenia and anemia requiring transfusion of PRBC and platelets. He was continued on pain management. He was recently seen at [facility] by oncology and was not felt to be a candidate for any further treatments. After seeing palliative care, family elected to pursue comfort measures and has been transition to comfort care. He is currently on fentanyl infusion for pain management and respiratory distress. He is waiting a bed at residential hospice.  Assessment/Plan: Severe Pancytopenia/Myeloma -presented with WBC 1.9, Hgb 6.1 and platelets 12K -01/10/20 bone marrow--97% plasma cellswith unfavorable cytogenetics -Evaluated on 01/26/20 bymyeloma specialist Dr.-Jonathan Percell Miller Lambird--recommended transition to palliative care and hospice patient does not have any good therapeutic options -has been transfused with PRBC and platelets -no further blood draws as focus of care is comfort directed  CKD 5/hyperkalemia -baseline creatinine 5.6-5.8 -no further blood draws as focus of care is comfort directed  LumbarRadiculopathy---- -Spinal Stenosis/lumbar radiculopathy status post fusion(initially in 2012 and then again in4/25/2017)---- MRI L-spine--diffusely abnormal bone marrow signal concerning for myeloproliferative disorder versus infiltrative metastatic disease. Abnormal prevertebraltissue as discussed above at L2-3 -3/16 MR T-spine--no acute findings,neurosurgery, Dr. Melynda Keller nonoperative managementfor radiculopathy -MRI of the thoracic and lumbar spine on 10/06/2019 shows T7, T10, T11 and T12  lesions are slightly better seen on the study as the prior study was degraded by motion artifact. No new findings. Overall grossly stable appearing myelomatous changes at T12, L2 and L3 with no progressive findings. Right seventh and ninth posterior rib lesions are probably stable. Right seventh posterior rib lesion may be larger or just better seen. There does appear to be a new adjacent soft tissue component.  COPD -stable on RA -BDs as needed  Chronic pain/neoplastic related pain -transition from IV fentanyl to TD with prn breakthrough meds -increase fentanyl TD to 150 mcg -add robaxin  Goals of Care/Social -see by palliative medicine -after goals of care discussion, patient has been transitioned to full comfort care -now GIP whil waiting for residential hospice -add ativan prn agitation -continue fentanyl TD and IV for breakthrough pain -dilaudid po prn breakthrough pain     Family Communication: Daughter at bedside updated 11/5  Consultants:Palliative medicine  Code Status: FULL COMFORT  DVT Prophylaxis:FULL COMFORT       Subjective: Patient is pleasantly confused.  Denies cp, sob, abd pain.  Objective: Vitals:   02/01/20 1300 02/01/20 1507 02/01/20 1742 02/02/20 0828  BP: (!) 110/56 (!) 113/59  106/62  Pulse: (!) 108 (!) 105  96  Resp: 16 15  19   Temp: 98.7 F (37.1 C) 99.3 F (37.4 C)  98.4 F (36.9 C)  TempSrc: Oral Oral  Oral  SpO2: 99% 99% 99% 100%    Intake/Output Summary (Last 24 hours) at 02/03/2020 1857 Last data filed at 02/03/2020 0500 Gross per 24 hour  Intake 240 ml  Output 450 ml  Net -210 ml   Weight change:  Exam:   General:  Pt is alert, pleasantly confused.  Follows commands.  cachetic  HEENT: No icterus, No thrush, No neck mass, [place]/AT  Cardiovascular: RRR, S1/S2, no rubs, no gallops  Respiratory: bibasilar rales. No wheeze  Abdomen: Soft/+BS, non tender, non distended, no guarding  Extremities: No  edema, No lymphangitis, No petechiae, No rashes, no synovitis   Data Reviewed: I have personally reviewed following labs and imaging studies Basic Metabolic Panel: Recent Labs  Lab 01/28/20 1011 01/29/20 0857  NA 137 135  K 4.4 4.2  CL 100 101  CO2 19* 20*  GLUCOSE 135* 95  BUN 102* 104*  CREATININE 5.64* 5.24*  CALCIUM 8.4* 7.9*   Liver Function Tests: No results for input(s): AST, ALT, ALKPHOS, BILITOT, PROT, ALBUMIN in the last 168 hours. No results for input(s): LIPASE, AMYLASE in the last 168 hours. No results for input(s): AMMONIA in the last 168 hours. Coagulation Profile: No results for input(s): INR, PROTIME in the last 168 hours. CBC: Recent Labs  Lab 01/28/20 1011 01/29/20 0857  WBC 1.3* 1.4*  HGB 8.8* 8.0*  HCT 25.6* 23.6*  MCV 93.1 94.0  PLT 23* 14*   Cardiac Enzymes: No results for input(s): CKTOTAL, CKMB, CKMBINDEX, TROPONINI in the last 168 hours. BNP: Invalid input(s): POCBNP CBG: No results for input(s): GLUCAP in the last 168 hours. HbA1C: No results for input(s): HGBA1C in the last 72 hours. Urine analysis:    Component Value Date/Time   COLORURINE STRAW (A) 07/15/2018 2033   APPEARANCEUR CLEAR 07/15/2018 2033   LABSPEC 1.006 07/15/2018 2033   PHURINE 5.0 07/15/2018 2033   GLUCOSEU NEGATIVE 07/15/2018 2033   HGBUR SMALL (A) 07/15/2018 2033   BILIRUBINUR NEGATIVE 07/15/2018 2033   KETONESUR NEGATIVE 07/15/2018 2033   PROTEINUR NEGATIVE 07/15/2018 2033   UROBILINOGEN 0.2 02/09/2008 2042   NITRITE NEGATIVE 07/15/2018 2033   LEUKOCYTESUR NEGATIVE 07/15/2018 2033   Sepsis Labs: @LABRCNTIP (procalcitonin:4,lacticidven:4) ) Recent Results (from the past 240 hour(s))  Respiratory Panel by RT PCR (Flu A&B, Covid) - Nasopharyngeal Swab     Status: None   Collection Time: 01/27/20  1:41 PM   Specimen: Nasopharyngeal Swab  Result Value Ref Range Status   SARS Coronavirus 2 by RT PCR NEGATIVE NEGATIVE Final    Comment: (NOTE) SARS-CoV-2  target nucleic acids are NOT DETECTED.  The SARS-CoV-2 RNA is generally detectable in upper respiratoy specimens during the acute phase of infection. The lowest concentration of SARS-CoV-2 viral copies this assay can detect is 131 copies/mL. A negative result does not preclude SARS-Cov-2 infection and should not be used as the sole basis for treatment or other patient management decisions. A negative result may occur with  improper specimen collection/handling, submission of specimen other than nasopharyngeal swab, presence of viral mutation(s) within the areas targeted by this assay, and inadequate number of viral copies (<131 copies/mL). A negative result must be combined with clinical observations, patient history, and epidemiological information. The expected result is Negative.  Fact Sheet for Patients:  PinkCheek.be  Fact Sheet for Healthcare Providers:  GravelBags.it  This test is no t yet approved or cleared by the [place] FDA and  has been authorized for detection and/or diagnosis of SARS-CoV-2 by FDA under an Emergency Use Authorization (EUA). This EUA will remain  in effect (meaning this test can be used) for the duration of the COVID-19 declaration under Section 564(b)(1) of the Act, 21 U.S.C. section 360bbb-3(b)(1), unless the authorization is terminated or revoked sooner.     Influenza A by PCR NEGATIVE NEGATIVE Final   Influenza B by PCR NEGATIVE NEGATIVE Final    Comment: (NOTE) The Xpert Xpress SARS-CoV-2/FLU/RSV assay is intended as an aid in  the diagnosis of influenza from Nasopharyngeal swab specimens and  should not be used as a sole basis for  treatment. Nasal washings and  aspirates are unacceptable for Xpert Xpress SARS-CoV-2/FLU/RSV  testing.  Fact Sheet for Patients: PinkCheek.be  Fact Sheet for Healthcare  Providers: GravelBags.it  This test is not yet approved or cleared by the [place] FDA and  has been authorized for detection and/or diagnosis of SARS-CoV-2 by  FDA under an Emergency Use Authorization (EUA). This EUA will remain  in effect (meaning this test can be used) for the duration of the  Covid-19 declaration under Section 564(b)(1) of the Act, 21  U.S.C. section 360bbb-3(b)(1), unless the authorization is  terminated or revoked. Performed at [facility], [address], [place], [place] [address]      Scheduled Meds: . fentaNYL  1 patch Transdermal Q72H   And  . fentaNYL  1 patch Transdermal Q72H  . senna  1 tablet Oral Daily   Continuous Infusions: . methocarbamol (ROBAXIN) IV 500 mg (02/03/20 1734)    Procedures/Studies: No results found.  Orson Eva, DO  [facility]  If 7PM-7AM, please contact night-coverage [email or web address] Password [facility] 02/03/2020, 6:57 PM   LOS: 4 days

## 2020-02-03 NOTE — Plan of Care (Signed)
  Problem: Education: Goal: Knowledge of General Education information will improve Description: Including pain rating scale, medication(s)/side effects and non-pharmacologic comfort measures Outcome: Not Progressing   

## 2020-02-04 MED ORDER — SALINE SPRAY 0.65 % NA SOLN
1.0000 | NASAL | Status: DC | PRN
  Administered 2020-02-04: 1 via NASAL
  Filled 2020-02-04: qty 44

## 2020-02-04 MED ORDER — GLYCOPYRROLATE 0.2 MG/ML IJ SOLN
0.2000 mg | Freq: Four times a day (QID) | INTRAMUSCULAR | Status: DC
  Administered 2020-02-04 – 2020-02-05 (×7): 0.2 mg via INTRAVENOUS
  Filled 2020-02-04 (×6): qty 1

## 2020-02-04 MED ORDER — METHOCARBAMOL 1000 MG/10ML IJ SOLN
INTRAMUSCULAR | Status: AC
  Filled 2020-02-04: qty 10

## 2020-02-04 MED ORDER — FENTANYL 75 MCG/HR TD PT72: 2.0000 | MEDICATED_PATCH | TRANSDERMAL | 0 refills | Status: AC

## 2020-02-04 NOTE — TOC Transition Note (Addendum)
Transition of Care [facility]) - CM/SW Discharge Note   Patient Details  Name: [name] MRN: [id number] Date of Birth: 30-Sep-1960  [facility] CM/SW Contact:  Natasha Bence, LCSW Phone Number: 02/04/2020, 4:28 PM   Clinical Narrative:    CSW contacted [facility] to inquire about bed availability. [facility] reported that they currently do not have any open beds.  Addendum 3:30pm- CSW contacted [facility] to inquire about availability of beds. [facility] reported that a bed had become available and that they were able to take patient on 11/6. CSW contacted patient's daughter. Patient's daughter agreeable to hospice and transfer to [facility].  CSW faxed discharge summary to [employer], completed med neccessity, and called EMS. Nurse to call report.  Addendum 5:30pm [facility] reported that they are unable to take patient due to not being able to provide Dilaudid. CSW confirmed with hospice admissions that Dilaudid was not listed on the discharge summary. Per MD, patient would be able to receive what ever pain medication deemed appropriate by [facility] MD. [facility] also confirmed with Robin at the facility that patient is only discharging with the patch and that they would be able to provide what ever pain medicine their MD deemed necessary. Shirlean Mylar reported a concern for other form of pain meds due to listed allergies. Tim reviewed listed allergies and discussed the pain medications that patient had a low allergic response to that were in line with expected side effects such as decreased heart rate. [facility] agreed to take patient on March 03, 2020 in the AM when they are able to fill patient's medication. TOC to follow.        Patient Goals and CMS Choice        Discharge Placement                       Discharge Plan and Services                                     Social Determinants of Health (SDOH) Interventions      Readmission Risk Interventions Readmission Risk Prevention Plan 07/16/2018 07/16/2018 06/14/2018  Transportation Screening Complete Complete Complete  Social Work Consult for [place] Planning/Counseling - - Complete  PCP or Specialist appointment within 3-5 days of discharge Complete - -  [place] or Home Care Consult Complete - -  Palliative Care Screening Not Applicable - -  [place] Not Applicable - -  Some recent data might be hidden

## 2020-02-04 NOTE — Progress Notes (Signed)
Did not go to [facility] today because they are unable to obtain dilaudid until tomorrow.  All they have is morphine and oxy which patient has allergy to.

## 2020-02-04 NOTE — Progress Notes (Signed)
Patient has been agitated and anxious during the shift, despite several prns given.  Patient has been restless during shift.

## 2020-02-04 NOTE — Discharge Summary (Signed)
Physician Discharge Summary  [name] JWJ:[id number] DOB: 1960/12/27 DOA: 02/02/2020  PCP: Rosita Fire, MD  Admit date: 02/17/2020 Discharge date: 02/04/2020  Admitted From: Home Disposition:  Residential Hospice    Discharge Condition: Stable CODE STATUS: FULL COMFORT Diet recommendation: Comfort Feeds   Brief/Interim Summary: 59 y.o.malewith medical history significant ofend-stage myeloma, associated pancytopenia, generalized failure to thrive, chronic pain from underlying malignancy, admitted to the hospital with severe cytopenias including thrombocytopenia and anemia requiring transfusion of PRBC and platelets. He was continued on pain management. He was recently seen at [facility] by oncology and was not felt to be a candidate for any further treatments. After seeing palliative care, family elected to pursue comfort measures and has been transition to comfort care. He is currently on fentanyl infusion for pain management and respiratory distress. He is waiting a bed at residential hospice. With the assistance of hospice of [facility], they requested that the patient be transitioned to TD and/or po comfort meds in preparation to transition to residential hospice.  TD fentanyl was started and  fentanyl infusion was subsequently wanted off.  The patient continued to receive po and IV opioids for breakthrough pain and dyspnea.  His family was educated on anticipatory care going forward with focus soley on the patient's comfort.  They agree with the overall plan, but did make multiple requests for adjustments of the patient's medications to optimize the patient's comfort.  The patient remained hemodynamically stable for transfer to residential hospice.  Discharge Diagnoses:  Severe Pancytopenia/Myeloma -presented with WBC 1.9, Hgb 6.1 and platelets 12K -01/10/20 bone marrow--97% plasma cellswith unfavorable  cytogenetics -Evaluated on 01/26/20 bymyeloma specialist Dr.-Jonathan Percell Miller Lambird--recommended transition to palliative care and hospice patient does not have any good therapeutic options -has been transfused with PRBC and platelets -no further blood draws as focus of care is comfort directed  CKD 5/hyperkalemia -baseline creatinine 5.6-5.8 -no further blood draws as focus of care is comfort directed  LumbarRadiculopathy---- -Spinal Stenosis/lumbar radiculopathy status post fusion(initially in 2012 and then again in4/25/2017)---- MRI L-spine--diffusely abnormal bone marrow signal concerning for myeloproliferative disorder versus infiltrative metastatic disease. Abnormal prevertebraltissue as discussed above at L2-3 -3/16 MR T-spine--no acute findings,neurosurgery, Dr. Melynda Keller nonoperative managementfor radiculopathy -MRI of the thoracic and lumbar spine on 10/06/2019 shows T7, T10, T11 and T12 lesions are slightly better seen on the study as the prior study was degraded by motion artifact. No new findings. Overall grossly stable appearing myelomatous changes at T12, L2 and L3 with no progressive findings. Right seventh and ninth posterior rib lesions are probably stable. Right seventh posterior rib lesion may be larger or just better seen. There does appear to be a new adjacent soft tissue component.  COPD -stable on RA -BDs as needed  Chronic pain/neoplastic related pain -transition from IV fentanyl to TD with prn breakthrough meds -increase fentanyl TD to 150 mcg -added robaxin  Goals of Care/Social -see by palliative medicine -after goals of care discussion, patient has been transitioned to full comfort care -now GIP whil waiting for residential hospice bed -add ativan prn agitation although family preferred haldol -continue fentanyl TD and IV for breakthrough pain -dilaudid po prn breakthrough pain -robinul for secretions    Discharge  Instructions   Allergies as of 02/04/2020      Reactions   Oxycodone-acetaminophen    Acetaminophen Nausea Only, Other (See Comments)   Elevated liver enzymes   Cymbalta [duloxetine Hcl] Swelling   Facial swelling   Diclofenac  Sodium Swelling      Diclofenac Sodium Swelling   Imodium [loperamide] Swelling   facial   Lyrica [pregabalin] Swelling   Morphine Other (See Comments)   Bradycardia   Vioxx [rofecoxib] Swelling   Aleve [naproxen] Swelling   eye      Medication List    TAKE these medications   fentaNYL 75 MCG/HR Commonly known as: DURAGESIC Place 2 patches onto the skin every 3 (three) days. Start taking on: 02-26-2020            Durable Medical Equipment  (From admission, onward)         Start     Ordered   01/31/20 1054  For home use only DME Air overlay mattress  Once        01/31/20 1053          Allergies  Allergen Reactions  . Oxycodone-Acetaminophen   . Acetaminophen Nausea Only and Other (See Comments)    Elevated liver enzymes  . Cymbalta [Duloxetine Hcl] Swelling    Facial swelling  . Diclofenac Sodium Swelling       . Diclofenac Sodium Swelling  . Imodium [Loperamide] Swelling    facial  . Lyrica [Pregabalin] Swelling  . Morphine Other (See Comments)    Bradycardia   . Vioxx [Rofecoxib] Swelling  . Aleve [Naproxen] Swelling    eye    Consultations:  Palliative medicine   Procedures/Studies: No results found.      Discharge Exam: Vitals:   02/01/20 1507 02/02/20 0828  BP: (!) 113/59 106/62  Pulse: (!) 105 96  Resp: 15 19  Temp: 99.3 F (37.4 C) 98.4 F (36.9 C)  SpO2:  100%   Vitals:   02/01/20 1300 02/01/20 1507 02/01/20 1742 02/02/20 0828  BP: (!) 110/56 (!) 113/59  106/62  Pulse: (!) 108 (!) 105  96  Resp: 16 15  19   Temp: 98.7 F (37.1 C) 99.3 F (37.4 C)  98.4 F (36.9 C)  TempSrc: Oral Oral  Oral  SpO2: 99% 99% 99% 100%    General: Pt is alert, awake, not in acute  distress Cardiovascular: RRR, S1/S2 +, no rubs, no gallops Respiratory: bibasilar rales Abdominal: Soft, NT, ND, bowel sounds + Extremities: no edema, no cyanosis   The results of significant diagnostics from this hospitalization (including imaging, microbiology, ancillary and laboratory) are listed below for reference.    Significant Diagnostic Studies: No results found.   Microbiology: Recent Results (from the past 240 hour(s))  Respiratory Panel by RT PCR (Flu A&B, Covid) - Nasopharyngeal Swab     Status: None   Collection Time: 01/27/20  1:41 PM   Specimen: Nasopharyngeal Swab  Result Value Ref Range Status   SARS Coronavirus 2 by RT PCR NEGATIVE NEGATIVE Final    Comment: (NOTE) SARS-CoV-2 target nucleic acids are NOT DETECTED.  The SARS-CoV-2 RNA is generally detectable in upper respiratoy specimens during the acute phase of infection. The lowest concentration of SARS-CoV-2 viral copies this assay can detect is 131 copies/mL. A negative result does not preclude SARS-Cov-2 infection and should not be used as the sole basis for treatment or other patient management decisions. A negative result may occur with  improper specimen collection/handling, submission of specimen other than nasopharyngeal swab, presence of viral mutation(s) within the areas targeted by this assay, and inadequate number of viral copies (<131 copies/mL). A negative result must be combined with clinical observations, patient history, and epidemiological information. The expected result is Negative.  Fact Sheet for Patients:  PinkCheek.be  Fact Sheet for Healthcare Providers:  GravelBags.it  This test is no t yet approved or cleared by the [place] FDA and  has been authorized for detection and/or diagnosis of SARS-CoV-2 by FDA under an Emergency Use Authorization (EUA). This EUA will remain  in effect (meaning this test can be used) for  the duration of the COVID-19 declaration under Section 564(b)(1) of the Act, 21 U.S.C. section 360bbb-3(b)(1), unless the authorization is terminated or revoked sooner.     Influenza A by PCR NEGATIVE NEGATIVE Final   Influenza B by PCR NEGATIVE NEGATIVE Final    Comment: (NOTE) The Xpert Xpress SARS-CoV-2/FLU/RSV assay is intended as an aid in  the diagnosis of influenza from Nasopharyngeal swab specimens and  should not be used as a sole basis for treatment. Nasal washings and  aspirates are unacceptable for Xpert Xpress SARS-CoV-2/FLU/RSV  testing.  Fact Sheet for Patients: PinkCheek.be  Fact Sheet for Healthcare Providers: GravelBags.it  This test is not yet approved or cleared by the [place] FDA and  has been authorized for detection and/or diagnosis of SARS-CoV-2 by  FDA under an Emergency Use Authorization (EUA). This EUA will remain  in effect (meaning this test can be used) for the duration of the  Covid-19 declaration under Section 564(b)(1) of the Act, 21  U.S.C. section 360bbb-3(b)(1), unless the authorization is  terminated or revoked. Performed at [facility], [address] [place], [place] [address]      Labs: Basic Metabolic Panel: Recent Labs  Lab 01/29/20 0857  NA 135  K 4.2  CL 101  CO2 20*  GLUCOSE 95  BUN 104*  CREATININE 5.24*  CALCIUM 7.9*   Liver Function Tests: No results for input(s): AST, ALT, ALKPHOS, BILITOT, PROT, ALBUMIN in the last 168 hours. No results for input(s): LIPASE, AMYLASE in the last 168 hours. No results for input(s): AMMONIA in the last 168 hours. CBC: Recent Labs  Lab 01/29/20 0857  WBC 1.4*  HGB 8.0*  HCT 23.6*  MCV 94.0  PLT 14*   Cardiac Enzymes: No results for input(s): CKTOTAL, CKMB, CKMBINDEX, TROPONINI in the last 168 hours. BNP: Invalid input(s): POCBNP CBG: No results for input(s): GLUCAP in the last 168 hours.  Time coordinating  discharge:  36 minutes  Signed:  Orson Eva, DO [facility] Pager: [phone] 02/04/2020, 4:20 PM

## 2020-02-05 NOTE — Progress Notes (Signed)
PROGRESS NOTE  [name] PPJ:[id number] DOB: 1960/07/03 DOA: 02/11/2020 PCP: Rosita Fire, MD  Brief History:  59 y.o.malewith medical history significant ofend-stage myeloma, associated pancytopenia, generalized failure to thrive, chronic pain from underlying malignancy, admitted to the hospital with severe cytopenias including thrombocytopenia and anemia requiring transfusion of PRBC and platelets. He was continued on pain management. He was recently seen at [facility] by oncology and was not felt to be a candidate for any further treatments. After seeing palliative care, family elected to pursue comfort measures and has been transition to comfort care. He is currently on fentanyl infusion for pain management and respiratory distress. He is waiting a bed at residential hospice. With the assistance of hospice of [place], they requested that the patient be transitioned to TD and/or po comfort meds in preparation to transition to residential hospice.  TD fentanyl was started and  fentanyl infusion was subsequently wanted off.  The patient continued to receive po and IV opioids for breakthrough pain and dyspnea.  His family was educated on anticipatory care going forward with focus soley on the patient's comfort.  They agree with the overall plan, but did make multiple requests for adjustments of the patient's medications to optimize the patient's comfort.  The patient remained hemodynamically stable for transfer to residential hospice.   Assessment/Plan: Severe Pancytopenia/Myeloma -presented with WBC 1.9, Hgb 6.1 and platelets 12K -01/10/20 bone marrow--97% plasma cellswith unfavorable cytogenetics -Evaluated on 01/26/20 bymyeloma specialist Dr.-Jonathan Percell Miller Lambird--recommended transition to palliative care and hospice patient does not have any good therapeutic options -has been transfused with PRBC and  platelets -no further blood draws as focus of care is comfort directed  CKD 5/hyperkalemia -baseline creatinine 5.6-5.8 -no further blood draws as focus of care is comfort directed  LumbarRadiculopathy---- -Spinal Stenosis/lumbar radiculopathy status post fusion(initially in 2012 and then again in4/25/2017)---- MRI L-spine--diffusely abnormal bone marrow signal concerning for myeloproliferative disorder versus infiltrative metastatic disease. Abnormal prevertebraltissue as discussed above at L2-3 -3/16 MR T-spine--no acute findings,neurosurgery, Dr. Melynda Keller nonoperative managementfor radiculopathy -MRI of the thoracic and lumbar spine on 10/06/2019 shows T7, T10, T11 and T12 lesions are slightly better seen on the study as the prior study was degraded by motion artifact. No new findings. Overall grossly stable appearing myelomatous changes at T12, L2 and L3 with no progressive findings. Right seventh and ninth posterior rib lesions are probably stable. Right seventh posterior rib lesion may be larger or just better seen. There does appear to be a new adjacent soft tissue component.  COPD -stable on RA -BDs as needed  Chronic pain/neoplastic related pain -transition from IV fentanyl to TD with prn breakthrough meds -increase fentanyl TD to 150 mcg -added robaxin  Goals of Care/Social -see by palliative medicine -after goals of care discussion, patient has been transitioned to full comfort care -now GIP whil waiting for residential hospice bed -add ativan prn agitation although family preferred haldol -continue fentanyl TD and IV for breakthrough pain -dilaudid po prn breakthrough pain -robinul for secretions    Family Communication:   daughter at bedside updated 11/7  Consultants:  palliative  Code Status:  FULL COMFORT  DVT Prophylaxis:  FULL COMFORT   Procedures: As Listed in Progress Note  Above  Antibiotics: None       Subjective: Pt is pleasantly confused.  He denies cp, sob.  Remainder ROS unobtainaable  Objective: Vitals:   02/01/20 1507 02/01/20 1742 02/02/20  2683 02/04/20 2122  BP: (!) 113/59  106/62 139/63  Pulse: (!) 105  96 (!) 115  Resp: 15  19 20   Temp: 99.3 F (37.4 C)  98.4 F (36.9 C) 98.4 F (36.9 C)  TempSrc: Oral  Oral   SpO2: 99% 99% 100% 100%    Intake/Output Summary (Last 24 hours) at 02/05/2020 1706 Last data filed at 02/05/2020 0400 Gross per 24 hour  Intake 685.76 ml  Output --  Net 685.76 ml   Weight change:  Exam:   General:  Pt is alert, no distress  HEENT: No icterus, dried blood in posterior pharynx  Cardiovascular: RRR, S1/S2, no rubs, no gallops  Respiratory: scattered rales  Abdomen: Soft/+BS, non tender, non distended, no guarding  Extremities: No edema, No lymphangitis, No petechiae, No rashes, no synovitis   Data Reviewed: I have personally reviewed following labs and imaging studies Basic Metabolic Panel: No results for input(s): NA, K, CL, CO2, GLUCOSE, BUN, CREATININE, CALCIUM, MG, PHOS in the last 168 hours. Liver Function Tests: No results for input(s): AST, ALT, ALKPHOS, BILITOT, PROT, ALBUMIN in the last 168 hours. No results for input(s): LIPASE, AMYLASE in the last 168 hours. No results for input(s): AMMONIA in the last 168 hours. Coagulation Profile: No results for input(s): INR, PROTIME in the last 168 hours. CBC: No results for input(s): WBC, NEUTROABS, HGB, HCT, MCV, PLT in the last 168 hours. Cardiac Enzymes: No results for input(s): CKTOTAL, CKMB, CKMBINDEX, TROPONINI in the last 168 hours. BNP: Invalid input(s): POCBNP CBG: No results for input(s): GLUCAP in the last 168 hours. HbA1C: No results for input(s): HGBA1C in the last 72 hours. Urine analysis:    Component Value Date/Time   COLORURINE STRAW (A) 07/15/2018 2033   APPEARANCEUR CLEAR 07/15/2018 2033   LABSPEC 1.006  07/15/2018 2033   PHURINE 5.0 07/15/2018 2033   GLUCOSEU NEGATIVE 07/15/2018 2033   HGBUR SMALL (A) 07/15/2018 2033   BILIRUBINUR NEGATIVE 07/15/2018 2033   KETONESUR NEGATIVE 07/15/2018 2033   PROTEINUR NEGATIVE 07/15/2018 2033   UROBILINOGEN 0.2 02/09/2008 2042   NITRITE NEGATIVE 07/15/2018 2033   LEUKOCYTESUR NEGATIVE 07/15/2018 2033   Sepsis Labs: @LABRCNTIP (procalcitonin:4,lacticidven:4) ) Recent Results (from the past 240 hour(s))  Respiratory Panel by RT PCR (Flu A&B, Covid) - Nasopharyngeal Swab     Status: None   Collection Time: 01/27/20  1:41 PM   Specimen: Nasopharyngeal Swab  Result Value Ref Range Status   SARS Coronavirus 2 by RT PCR NEGATIVE NEGATIVE Final    Comment: (NOTE) SARS-CoV-2 target nucleic acids are NOT DETECTED.  The SARS-CoV-2 RNA is generally detectable in upper respiratoy specimens during the acute phase of infection. The lowest concentration of SARS-CoV-2 viral copies this assay can detect is 131 copies/mL. A negative result does not preclude SARS-Cov-2 infection and should not be used as the sole basis for treatment or other patient management decisions. A negative result may occur with  improper specimen collection/handling, submission of specimen other than nasopharyngeal swab, presence of viral mutation(s) within the areas targeted by this assay, and inadequate number of viral copies (<131 copies/mL). A negative result must be combined with clinical observations, patient history, and epidemiological information. The expected result is Negative.  Fact Sheet for Patients:  PinkCheek.be  Fact Sheet for Healthcare Providers:  GravelBags.it  This test is no t yet approved or cleared by the [place] FDA and  has been authorized for detection and/or diagnosis of SARS-CoV-2 by FDA under an Emergency Use Authorization (EUA). This  EUA will remain  in effect (meaning this test can be  used) for the duration of the COVID-19 declaration under Section 564(b)(1) of the Act, 21 U.S.C. section 360bbb-3(b)(1), unless the authorization is terminated or revoked sooner.     Influenza A by PCR NEGATIVE NEGATIVE Final   Influenza B by PCR NEGATIVE NEGATIVE Final    Comment: (NOTE) The Xpert Xpress SARS-CoV-2/FLU/RSV assay is intended as an aid in  the diagnosis of influenza from Nasopharyngeal swab specimens and  should not be used as a sole basis for treatment. Nasal washings and  aspirates are unacceptable for Xpert Xpress SARS-CoV-2/FLU/RSV  testing.  Fact Sheet for Patients: PinkCheek.be  Fact Sheet for Healthcare Providers: GravelBags.it  This test is not yet approved or cleared by the [place] FDA and  has been authorized for detection and/or diagnosis of SARS-CoV-2 by  FDA under an Emergency Use Authorization (EUA). This EUA will remain  in effect (meaning this test can be used) for the duration of the  Covid-19 declaration under Section 564(b)(1) of the Act, 21  U.S.C. section 360bbb-3(b)(1), unless the authorization is  terminated or revoked. Performed at [facility], [address], [place], [place] [address]      Scheduled Meds: . fentaNYL  1 patch Transdermal Q72H   And  . fentaNYL  1 patch Transdermal Q72H  . glycopyrrolate  0.2 mg Intravenous QID  . senna  1 tablet Oral Daily   Continuous Infusions: . methocarbamol (ROBAXIN) IV 500 mg (02/05/20 0120)    Procedures/Studies: No results found.  Orson Eva, DO  [facility]  If 7PM-7AM, please contact night-coverage [email or web address] Password [email or web address] 02/05/2020, 5:06 PM   LOS: 6 days

## 2020-02-29 NOTE — Death Summary Note (Signed)
DEATH SUMMARY   Patient Details  Name: [name] MRN: [id number] DOB: Mar 09, 1961  Admission/Discharge Information   Admit Date:  02-01-20  Date of Death: Date of Death: 02-08-2020  Time of Death: Time of Death: 0511  Length of Stay: 2022/06/07  Referring Physician: Rosita Fire, MD   Reason(s) for Hospitalization  Pancytopenia, uncontrolled pain, generalized weakness  Diagnoses  Preliminary cause of death: Myeloma Secondary Diagnoses (including complications and co-morbidities):  Severe Pancytopenia/Myeloma -presented with WBC 1.9, Hgb 6.1 and platelets 12K -01/10/20 bone marrow--97% plasma cellswith unfavorable cytogenetics -Evaluated on 01/26/20 bymyeloma specialist Dr.-Jonathan Percell Miller Lambird--recommended transition to palliative care and hospice patient does not have any good therapeutic options -has been transfused with PRBC and platelets -no further blood draws as focus of care is comfort directed  CKD 5/hyperkalemia -baseline creatinine 5.6-5.8 -no further blood draws as focus of care is comfort directed  LumbarRadiculopathy---- -Spinal Stenosis/lumbar radiculopathy status post fusion(initially in 06-23-10 and then again in4/25/2017)---- MRI L-spine--diffusely abnormal bone marrow signal concerning for myeloproliferative disorder versus infiltrative metastatic disease. Abnormal prevertebraltissue as discussed above at L2-3 -3/16 MR T-spine--no acute findings,neurosurgery, Dr. Melynda Keller nonoperative managementfor radiculopathy -MRI of the thoracic and lumbar spine on 10/06/2019 shows T7, T10, T11 and T12 lesions are slightly better seen on the study as the prior study was degraded by motion artifact. No new findings. Overall grossly stable appearing myelomatous changes at T12, L2 and L3 with no progressive findings. Right seventh and ninth posterior rib lesions are probably stable. Right seventh posterior rib lesion may be larger or just better seen.  There does appear to be a new adjacent soft tissue component.  COPD -stable on RA -BDs as needed  Chronic pain/neoplastic related pain -transition from IV fentanyl to TD with prn breakthrough meds -increase fentanyl TD to 150 mcg -addedrobaxin  Goals of Care/Social -see by palliative medicine -after goals of care discussion, patient has been transitioned to full comfort care -now GIP whil waiting for residential hospicebed -add ativan prn agitationalthough family preferred haldol -continue fentanyl TD and IV for breakthrough pain -dilaudid po prn breakthrough pain -robinul for secretions  Brief Hospital Course (including significant findings, care, treatment, and services provided and events leading to death)  [name] is a  59 y.o.malewith medical history significant ofend-stage myeloma, associated pancytopenia, generalized failure to thrive, chronic pain from underlying malignancy, admitted to the hospital with uncontrolled neoplastic pain and generalized weakness.  He was noted to have severe cytopenias including thrombocytopenia and anemia requiring transfusion of PRBC and platelets. He was continued on pain management. He was recently seen at [facility] by oncology and was not felt to be a candidate for any further treatments. After seeing palliative care, family elected to pursue comfort measures and has been transition to comfort care. He is currently on fentanyl infusion for pain management and respiratory distress. He is waiting a bed at residential hospice. With the assistance of hospice of [place], they requested that the patient be transitioned to TD and/or po comfort meds in preparation to transition to residential hospice. TD fentanyl was started and fentanyl infusion was subsequently wanted off. The patient continued to receive po and IV opioids for breakthrough pain and dyspnea. His family was  educated on anticipatory care going forward with focus soley on the patient's comfort. They agree with the overall plan, but did make multiple requests for adjustments of the patient's medications to optimize the patient's comfort. His opioid regiment was adjusted to better control  the patient's pain and agitation.  Robinul was added for his secretions.  Ultimately, he was more comfortable without further agitation.  Further adjustments to his medications were made to ensure his comfort.  He ultimately died in peace with family at his side    Pertinent Labs and Studies  Significant Diagnostic Studies No results found.  Microbiology No results found for this or any previous visit (from the past 240 hour(s)).  Lab Basic Metabolic Panel: No results for input(s): NA, K, CL, CO2, GLUCOSE, BUN, CREATININE, CALCIUM, MG, PHOS in the last 168 hours. Liver Function Tests: No results for input(s): AST, ALT, ALKPHOS, BILITOT, PROT, ALBUMIN in the last 168 hours. No results for input(s): LIPASE, AMYLASE in the last 168 hours. No results for input(s): AMMONIA in the last 168 hours. CBC: No results for input(s): WBC, NEUTROABS, HGB, HCT, MCV, PLT in the last 168 hours. Cardiac Enzymes: No results for input(s): CKTOTAL, CKMB, CKMBINDEX, TROPONINI in the last 168 hours. Sepsis Labs: No results for input(s): PROCALCITON, WBC, LATICACIDVEN in the last 168 hours.  Procedures/Operations     [name] February 15, 2020, 5:49 PM

## 2020-02-29 NOTE — Progress Notes (Signed)
5:17am RN called that patient passed at 5:05am

## 2020-02-29 NOTE — Progress Notes (Addendum)
Patient was pulseless with no vital signs at 0505 this morning. Family at bedside and rest of family also called. MD paged at [phone]. 2 RNs confirmed death. [place] [facility] services called. Chaplain called per Family request. Patient's Central line dressing pulled.

## 2020-02-29 NOTE — Progress Notes (Signed)
   2020-02-25 0500  Attending Physican Contact  Attending Physician Notified Y  Attending Physician (First and Last Name) Cloyd Stagers MD  Will the above attending physician sign death certificate? No  Physician (First and Last Name) Who Will Sign Death Certificate tat, david  Post Mortem Checklist  Date of Death 25-Feb-2020  Time of Death 0511  Pronounced By 2 rns  Next of kin notified Yes  Name of next of kin notified of death whitney carter  Contact Person's Relationship to Patient Daughter  Contact Person's Phone Number [phone]  Was the patient a No Code Blue or a Limited Code Blue? Yes  Did the patient die unattended? No  Patient restrained? Not applicable  Height 6' (4.360 m)  Weight 64 kg  [place] [facility]  Notification Date 2020-02-25  Notification Time [place] [facility] Service Number [phone]  Is patient a potential donor? Y  Donation Type Eyes  Autopsy  Autopsy requested by N/A  Patient and Hospital Property Returned  Patient belongings from bedside/safe/pharmacy returned  Yes  Valuables returned to?  (family, daughter [name] and wife anita)  Arts administrator valuables returned  (whitney, and Brewing technologist)  Dead on [place] (Emergency Department)  Patient dead on arrival? No  Medical Examiner  Is this a medical examiner's case? [place] home name/address/phone # [place] - [address] [place]  Planned location of pickup [place]

## 2020-02-29 NOTE — Progress Notes (Signed)
Patient found pulseless and without respirations.  Expiration confirmed by 2 RNs.  Family at bedside.  Chaplain requested and called by supervisor.

## 2020-02-29 DEATH — deceased

## 2020-03-02 ENCOUNTER — Telehealth (HOSPITAL_COMMUNITY): Payer: Self-pay | Admitting: Hematology

## 2020-09-28 IMAGING — DX METASTATIC BONE SURVEY
9 of 10 series · 9 of 10 positions shown · non-contrast
Comparison: Chest CT 06/14/2018, plain film 06/12/2018, lumbar CT
05/06/2018

CLINICAL DATA: 57-year-old male with a history of hypercalcemia

EXAM:
METASTATIC BONE SURVEY

[skull lat]
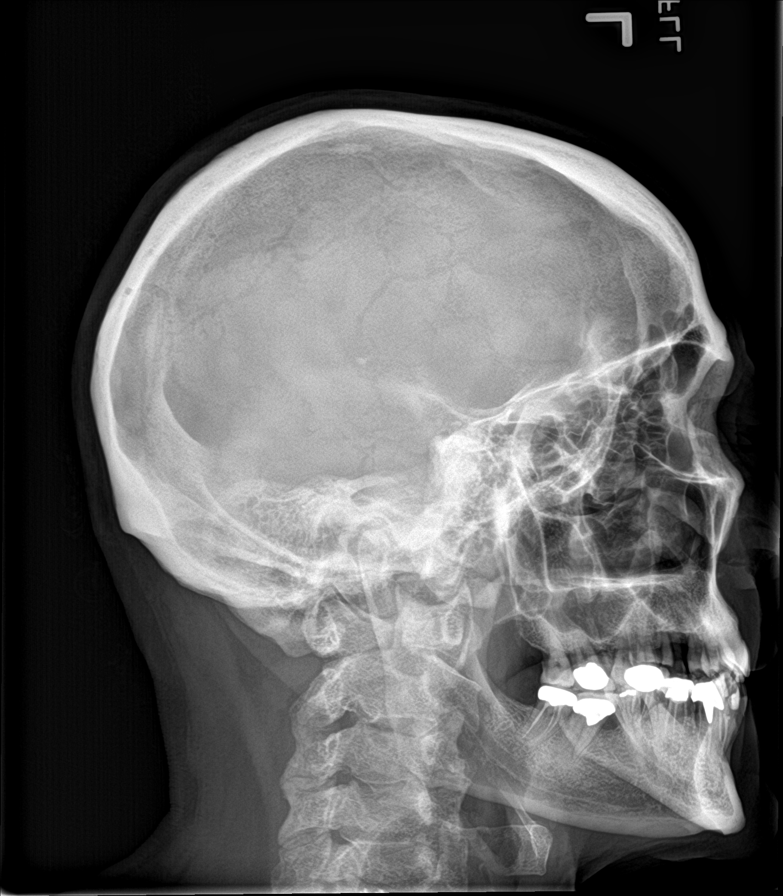

[shoulder ap (1 of 2)]
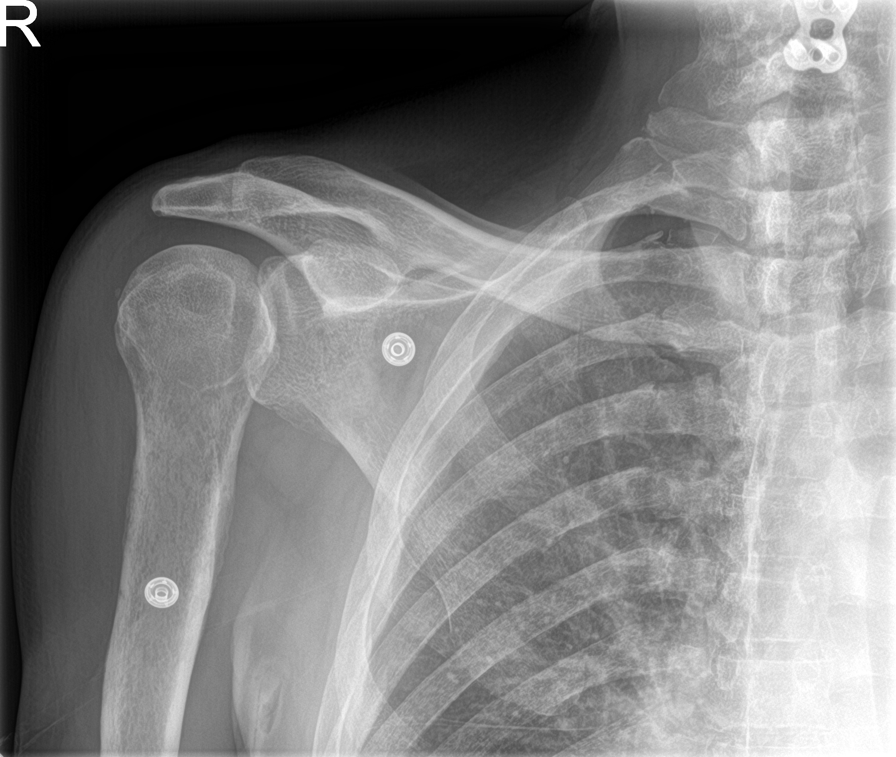

[shoulder ap (2 of 2)]
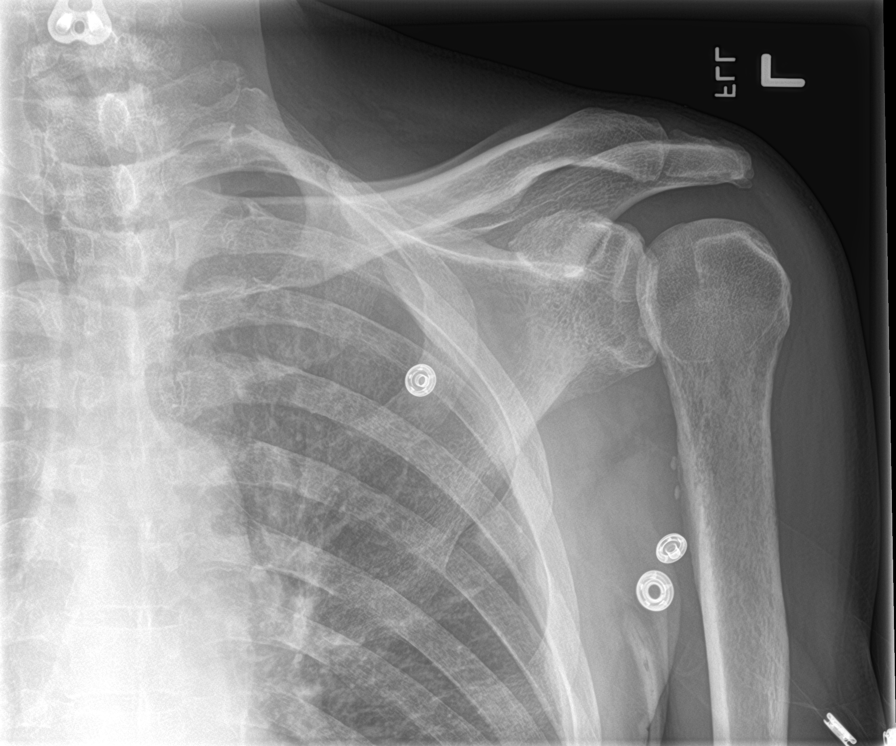

[humerus ap (1 of 2)]
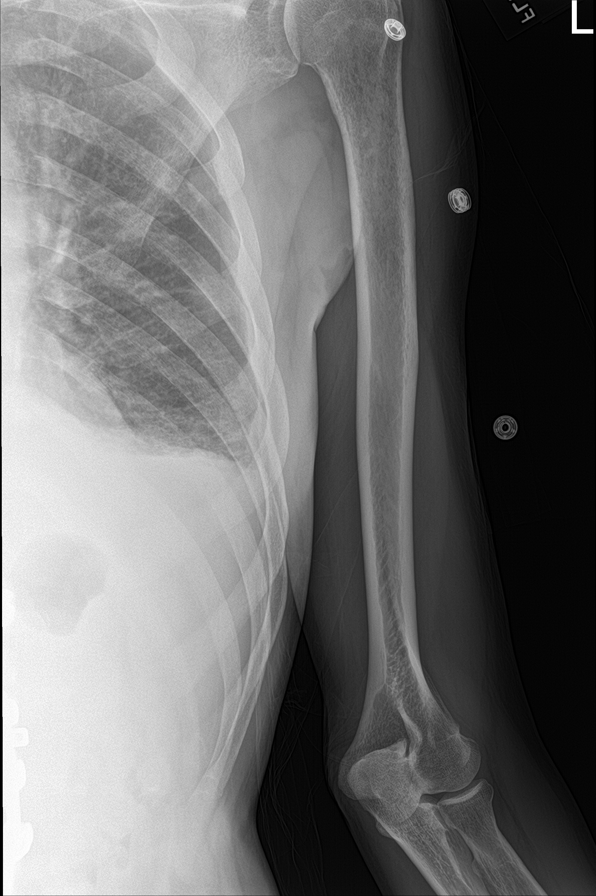

[humerus ap (2 of 2)]
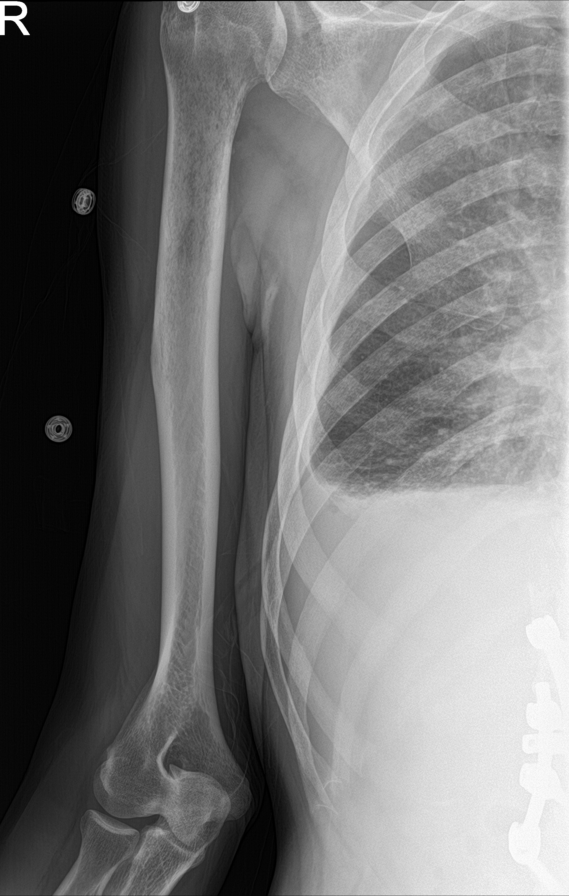

[forearm ap (1 of 2)]
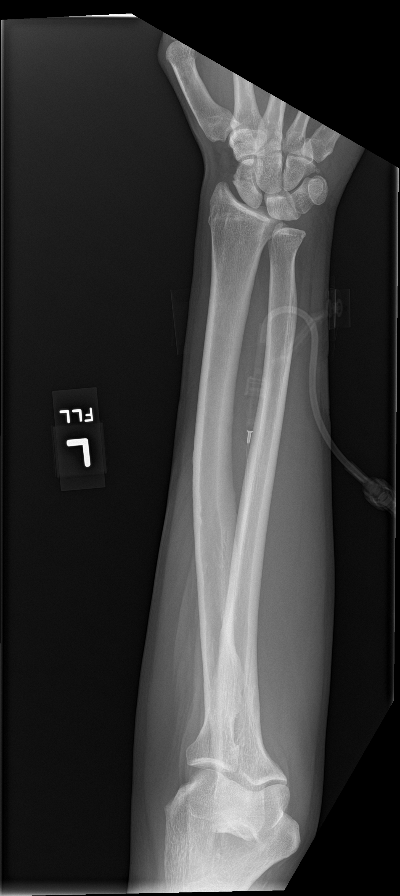

[forearm ap (2 of 2)]
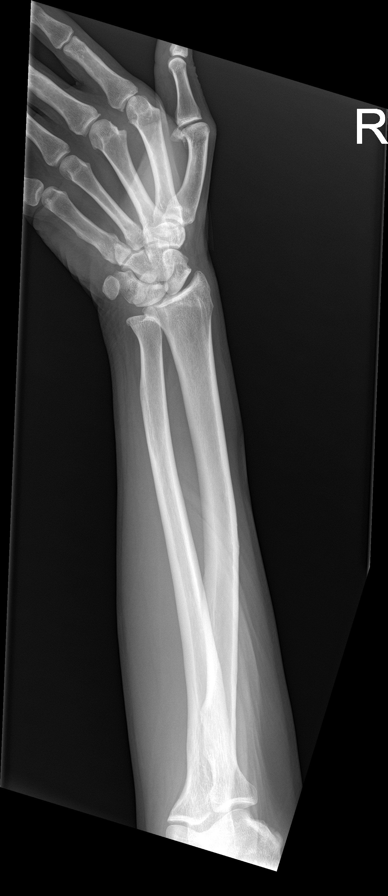

[c-spine ap]
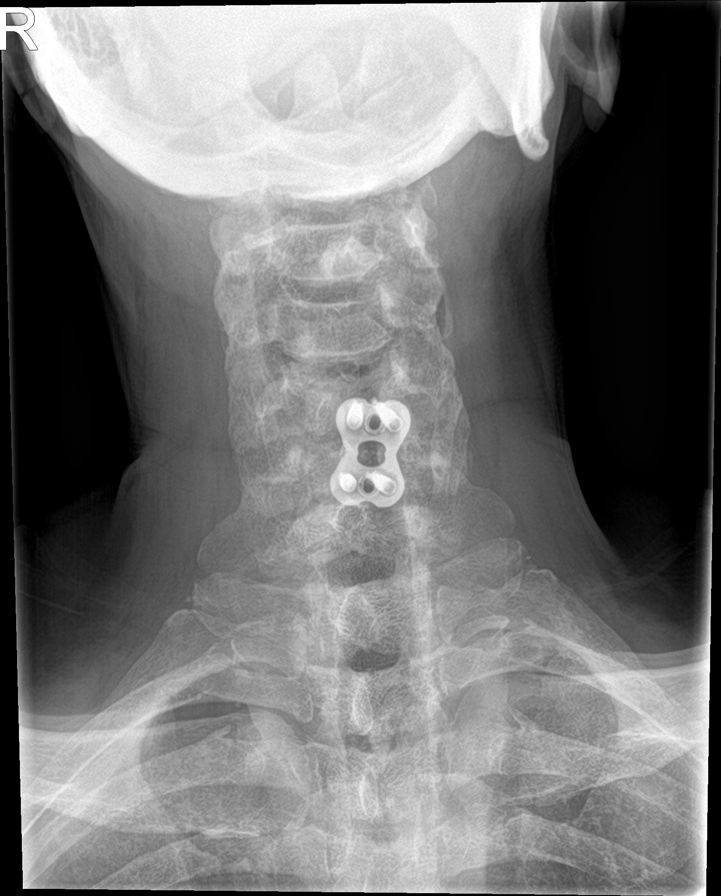

[c-spine lat]
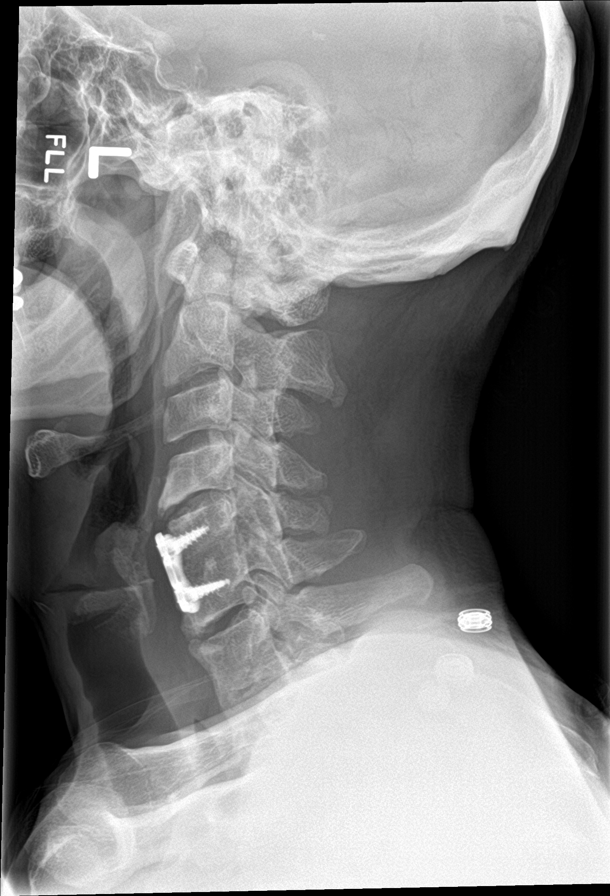

[9 of 10 positions shown; findings below may reference images not displayed]

FINDINGS: Skull:

No acute fracture.

No lytic or sclerotic lesion identified.  Endodontal disease.

Cervical spine:

Surgical changes of anterior cervical discectomy infusion with
anterior plate screw fixation C5-C6 with no complicating features.
Degenerative changes at the levels above and below.

No lytic lesions or sclerotic lesions.  Alignment maintained.

Thoracic spine:

Vertebral body heights maintained with alignment. Mild disc disease
of the thoracic spine. No acute fracture.

No lytic or sclerotic lesions. Unremarkable appearance of the
pedicles.

Lumbar spine:

Surgical changes of posterior lumbar interbody fusion with bilateral
pedicle screw and rod fixation spanning L1-L4. No complicating
features.

No acute fracture. The lytic changes that were identified on prior
CT are not visualized on the current plain film.

Moderate disc disease and facet disease of the lumbar spine.

No lytic or sclerotic lesions identified.

Chest:

Cardiomediastinal silhouette within normal limits.

Blunting of the bilateral costophrenic angles with obscuration of
the hemidiaphragms bilaterally. No interlobular septal thickening.

No rib fracture identified.

Pelvis:

No acute fracture. Sclerotic changes at the inferior aspect of the
left sacroiliac joint on the iliac aspect, similar to prior CT
abdomen. No lytic changes. Degenerative changes of the hips.

Right upper extremity:

No acute fracture.  No lucent lesion or sclerotic focus.

Left upper extremity:

No acute fracture.  No lucent lesion or sclerotic changes.

Right lower extremity:

No acute fracture.  No no lucent lesion or sclerotic lesion.

Left lower extremity:

No acute fracture.  No lucent lesion or sclerotic lesion.
IMPRESSION: Abnormal lucent changes identified in the lumbar spine on the prior
CT dated 05/06/2018 are not visualized on the current plain film. If
concern for malignancy, correlation with either nuclear medicine
bone scan or PET-CT may be useful.

Sclerotic focus at the inferior aspect of the left sacral iliac
joint, present on prior CT pelvis. If concern for malignancy,
correlation with either nuclear medicine bone scan or PET-CT may be
useful.

Bilateral small pleural effusions, present on prior CT.
